# Patient Record
Sex: Female | Born: 1955 | Race: Black or African American | Hispanic: No | Marital: Single | Smoking: Current every day smoker
Health system: Southern US, Community
[De-identification: ages and names within clinical notes are randomized; demographics above are authoritative.]

## PROBLEM LIST (undated history)

## (undated) DIAGNOSIS — K644 Residual hemorrhoidal skin tags: Secondary | ICD-10-CM

## (undated) DIAGNOSIS — E119 Type 2 diabetes mellitus without complications: Secondary | ICD-10-CM

## (undated) DIAGNOSIS — K635 Polyp of colon: Secondary | ICD-10-CM

## (undated) DIAGNOSIS — K219 Gastro-esophageal reflux disease without esophagitis: Secondary | ICD-10-CM

## (undated) DIAGNOSIS — Z789 Other specified health status: Secondary | ICD-10-CM

## (undated) DIAGNOSIS — I10 Essential (primary) hypertension: Secondary | ICD-10-CM

## (undated) DIAGNOSIS — N959 Unspecified menopausal and perimenopausal disorder: Secondary | ICD-10-CM

## (undated) DIAGNOSIS — D259 Leiomyoma of uterus, unspecified: Secondary | ICD-10-CM

## (undated) DIAGNOSIS — M1990 Unspecified osteoarthritis, unspecified site: Secondary | ICD-10-CM

## (undated) DIAGNOSIS — G4700 Insomnia, unspecified: Secondary | ICD-10-CM

## (undated) DIAGNOSIS — N950 Postmenopausal bleeding: Secondary | ICD-10-CM

## (undated) DIAGNOSIS — E785 Hyperlipidemia, unspecified: Secondary | ICD-10-CM

## (undated) DIAGNOSIS — H35039 Hypertensive retinopathy, unspecified eye: Secondary | ICD-10-CM

## (undated) DIAGNOSIS — M6530 Trigger finger, unspecified finger: Secondary | ICD-10-CM

## (undated) DIAGNOSIS — E1142 Type 2 diabetes mellitus with diabetic polyneuropathy: Secondary | ICD-10-CM

## (undated) DIAGNOSIS — D126 Benign neoplasm of colon, unspecified: Secondary | ICD-10-CM

## (undated) DIAGNOSIS — N6089 Other benign mammary dysplasias of unspecified breast: Secondary | ICD-10-CM

## (undated) DIAGNOSIS — G510 Bell's palsy: Secondary | ICD-10-CM

## (undated) HISTORY — DX: Trigger finger, unspecified finger: M65.30

## (undated) HISTORY — DX: Bell's palsy: G51.0

## (undated) HISTORY — DX: Hyperlipidemia, unspecified: E78.5

## (undated) HISTORY — DX: Essential (primary) hypertension: I10

## (undated) HISTORY — DX: Gastro-esophageal reflux disease without esophagitis: K21.9

## (undated) HISTORY — DX: Insomnia, unspecified: G47.00

## (undated) HISTORY — DX: Postmenopausal bleeding: N95.0

## (undated) HISTORY — DX: Unspecified osteoarthritis, unspecified site: M19.90

## (undated) HISTORY — DX: Type 2 diabetes mellitus without complications: E11.9

## (undated) HISTORY — DX: Leiomyoma of uterus, unspecified: D25.9

## (undated) HISTORY — DX: Hypertensive retinopathy, unspecified eye: H35.039

## (undated) HISTORY — DX: Polyp of colon: K63.5

## (undated) HISTORY — DX: Other benign mammary dysplasias of unspecified breast: N60.89

## (undated) HISTORY — DX: Unspecified menopausal and perimenopausal disorder: N95.9

## (undated) HISTORY — DX: Type 2 diabetes mellitus with diabetic polyneuropathy: E11.42

## (undated) HISTORY — DX: Benign neoplasm of colon, unspecified: D12.6

## (undated) HISTORY — PX: TUBAL LIGATION: SHX77

## (undated) HISTORY — DX: Other specified health status: Z78.9

## (undated) HISTORY — DX: Residual hemorrhoidal skin tags: K64.4

---

## 1997-12-20 ENCOUNTER — Inpatient Hospital Stay (HOSPITAL_COMMUNITY): Admission: EM | Admit: 1997-12-20 | Discharge: 1997-12-20 | Payer: Self-pay | Admitting: Obstetrics & Gynecology

## 1998-01-10 ENCOUNTER — Encounter: Admission: RE | Admit: 1998-01-10 | Discharge: 1998-01-10 | Payer: Self-pay | Admitting: Hematology and Oncology

## 1998-02-24 ENCOUNTER — Encounter: Admission: RE | Admit: 1998-02-24 | Discharge: 1998-02-24 | Payer: Self-pay | Admitting: Obstetrics

## 1998-02-24 ENCOUNTER — Other Ambulatory Visit: Admission: RE | Admit: 1998-02-24 | Discharge: 1998-02-24 | Payer: Self-pay | Admitting: Obstetrics

## 1998-02-25 ENCOUNTER — Encounter: Admission: RE | Admit: 1998-02-25 | Discharge: 1998-02-25 | Payer: Self-pay | Admitting: Internal Medicine

## 1998-03-08 ENCOUNTER — Encounter: Admission: RE | Admit: 1998-03-08 | Discharge: 1998-03-08 | Payer: Self-pay | Admitting: Hematology and Oncology

## 1998-03-30 ENCOUNTER — Encounter: Admission: RE | Admit: 1998-03-30 | Discharge: 1998-03-30 | Payer: Self-pay | Admitting: Internal Medicine

## 1998-06-10 ENCOUNTER — Encounter: Admission: RE | Admit: 1998-06-10 | Discharge: 1998-06-10 | Payer: Self-pay | Admitting: Internal Medicine

## 1998-07-10 ENCOUNTER — Inpatient Hospital Stay (HOSPITAL_COMMUNITY): Admission: AD | Admit: 1998-07-10 | Discharge: 1998-07-10 | Payer: Self-pay | Admitting: Obstetrics

## 1998-07-11 ENCOUNTER — Ambulatory Visit (HOSPITAL_COMMUNITY): Admission: RE | Admit: 1998-07-11 | Discharge: 1998-07-11 | Payer: Self-pay | Admitting: Obstetrics

## 1998-07-18 ENCOUNTER — Encounter: Admission: RE | Admit: 1998-07-18 | Discharge: 1998-07-18 | Payer: Self-pay | Admitting: Internal Medicine

## 1998-07-21 ENCOUNTER — Encounter: Admission: RE | Admit: 1998-07-21 | Discharge: 1998-07-21 | Payer: Self-pay | Admitting: Obstetrics

## 1998-08-09 ENCOUNTER — Encounter (HOSPITAL_COMMUNITY): Admission: RE | Admit: 1998-08-09 | Discharge: 1998-11-07 | Payer: Self-pay | Admitting: Internal Medicine

## 1998-10-06 ENCOUNTER — Encounter: Admission: RE | Admit: 1998-10-06 | Discharge: 1998-10-06 | Payer: Self-pay | Admitting: Internal Medicine

## 1998-11-18 ENCOUNTER — Encounter: Admission: RE | Admit: 1998-11-18 | Discharge: 1998-11-18 | Payer: Self-pay | Admitting: Internal Medicine

## 1999-01-25 ENCOUNTER — Encounter: Admission: RE | Admit: 1999-01-25 | Discharge: 1999-01-25 | Payer: Self-pay | Admitting: Internal Medicine

## 1999-04-12 ENCOUNTER — Encounter: Payer: Self-pay | Admitting: Obstetrics

## 1999-04-12 ENCOUNTER — Inpatient Hospital Stay (HOSPITAL_COMMUNITY): Admission: AD | Admit: 1999-04-12 | Discharge: 1999-04-14 | Payer: Self-pay | Admitting: Obstetrics

## 1999-04-13 ENCOUNTER — Encounter: Payer: Self-pay | Admitting: Obstetrics

## 1999-05-31 ENCOUNTER — Encounter: Admission: RE | Admit: 1999-05-31 | Discharge: 1999-05-31 | Payer: Self-pay | Admitting: Internal Medicine

## 1999-08-24 ENCOUNTER — Encounter: Admission: RE | Admit: 1999-08-24 | Discharge: 1999-08-24 | Payer: Self-pay | Admitting: Hematology and Oncology

## 1999-08-24 ENCOUNTER — Ambulatory Visit (HOSPITAL_COMMUNITY): Admission: RE | Admit: 1999-08-24 | Discharge: 1999-08-24 | Payer: Self-pay | Admitting: Hematology and Oncology

## 1999-08-24 ENCOUNTER — Encounter: Payer: Self-pay | Admitting: Hematology and Oncology

## 1999-10-19 ENCOUNTER — Ambulatory Visit (HOSPITAL_COMMUNITY): Admission: RE | Admit: 1999-10-19 | Discharge: 1999-10-19 | Payer: Self-pay | Admitting: Hematology and Oncology

## 1999-10-19 ENCOUNTER — Encounter: Admission: RE | Admit: 1999-10-19 | Discharge: 1999-10-19 | Payer: Self-pay | Admitting: Hematology and Oncology

## 1999-11-21 ENCOUNTER — Encounter: Admission: RE | Admit: 1999-11-21 | Discharge: 1999-11-21 | Payer: Self-pay | Admitting: Internal Medicine

## 2000-01-31 ENCOUNTER — Encounter: Admission: RE | Admit: 2000-01-31 | Discharge: 2000-01-31 | Payer: Self-pay | Admitting: Hematology and Oncology

## 2000-02-25 ENCOUNTER — Emergency Department (HOSPITAL_COMMUNITY): Admission: EM | Admit: 2000-02-25 | Discharge: 2000-02-26 | Payer: Self-pay | Admitting: Emergency Medicine

## 2000-03-20 ENCOUNTER — Encounter: Admission: RE | Admit: 2000-03-20 | Discharge: 2000-03-20 | Payer: Self-pay | Admitting: Internal Medicine

## 2000-04-25 ENCOUNTER — Encounter: Admission: RE | Admit: 2000-04-25 | Discharge: 2000-04-25 | Payer: Self-pay | Admitting: Hematology and Oncology

## 2000-05-24 ENCOUNTER — Encounter: Admission: RE | Admit: 2000-05-24 | Discharge: 2000-05-24 | Payer: Self-pay | Admitting: Internal Medicine

## 2000-06-02 ENCOUNTER — Inpatient Hospital Stay (HOSPITAL_COMMUNITY): Admission: AD | Admit: 2000-06-02 | Discharge: 2000-06-02 | Payer: Self-pay | Admitting: Obstetrics & Gynecology

## 2000-07-23 ENCOUNTER — Encounter: Admission: RE | Admit: 2000-07-23 | Discharge: 2000-07-23 | Payer: Self-pay | Admitting: Hematology and Oncology

## 2000-08-03 ENCOUNTER — Inpatient Hospital Stay (HOSPITAL_COMMUNITY): Admission: AD | Admit: 2000-08-03 | Discharge: 2000-08-03 | Payer: Self-pay | Admitting: *Deleted

## 2000-09-27 ENCOUNTER — Ambulatory Visit (HOSPITAL_COMMUNITY): Admission: RE | Admit: 2000-09-27 | Discharge: 2000-09-27 | Payer: Self-pay | Admitting: Internal Medicine

## 2000-09-27 ENCOUNTER — Encounter: Admission: RE | Admit: 2000-09-27 | Discharge: 2000-09-27 | Payer: Self-pay | Admitting: Internal Medicine

## 2000-12-05 ENCOUNTER — Encounter: Admission: RE | Admit: 2000-12-05 | Discharge: 2000-12-05 | Payer: Self-pay | Admitting: Internal Medicine

## 2001-02-18 ENCOUNTER — Encounter: Admission: RE | Admit: 2001-02-18 | Discharge: 2001-02-18 | Payer: Self-pay | Admitting: Internal Medicine

## 2001-03-10 ENCOUNTER — Encounter: Admission: RE | Admit: 2001-03-10 | Discharge: 2001-03-10 | Payer: Self-pay | Admitting: Internal Medicine

## 2001-03-28 ENCOUNTER — Encounter: Admission: RE | Admit: 2001-03-28 | Discharge: 2001-03-28 | Payer: Self-pay | Admitting: Internal Medicine

## 2001-05-04 ENCOUNTER — Emergency Department (HOSPITAL_COMMUNITY): Admission: EM | Admit: 2001-05-04 | Discharge: 2001-05-04 | Payer: Self-pay | Admitting: Emergency Medicine

## 2001-05-14 ENCOUNTER — Encounter: Admission: RE | Admit: 2001-05-14 | Discharge: 2001-05-14 | Payer: Self-pay | Admitting: Internal Medicine

## 2001-05-14 ENCOUNTER — Ambulatory Visit (HOSPITAL_COMMUNITY): Admission: RE | Admit: 2001-05-14 | Discharge: 2001-05-14 | Payer: Self-pay | Admitting: Internal Medicine

## 2001-05-16 ENCOUNTER — Encounter: Payer: Self-pay | Admitting: Internal Medicine

## 2001-05-16 ENCOUNTER — Ambulatory Visit (HOSPITAL_COMMUNITY): Admission: RE | Admit: 2001-05-16 | Discharge: 2001-05-16 | Payer: Self-pay | Admitting: Internal Medicine

## 2001-05-26 ENCOUNTER — Encounter: Admission: RE | Admit: 2001-05-26 | Discharge: 2001-05-26 | Payer: Self-pay | Admitting: Internal Medicine

## 2001-08-19 ENCOUNTER — Encounter: Admission: RE | Admit: 2001-08-19 | Discharge: 2001-08-19 | Payer: Self-pay | Admitting: Internal Medicine

## 2001-10-09 ENCOUNTER — Emergency Department (HOSPITAL_COMMUNITY): Admission: EM | Admit: 2001-10-09 | Discharge: 2001-10-09 | Payer: Self-pay

## 2001-10-17 ENCOUNTER — Encounter: Admission: RE | Admit: 2001-10-17 | Discharge: 2001-10-17 | Payer: Self-pay | Admitting: *Deleted

## 2001-10-17 ENCOUNTER — Other Ambulatory Visit: Admission: RE | Admit: 2001-10-17 | Discharge: 2001-11-06 | Payer: Self-pay | Admitting: Obstetrics & Gynecology

## 2001-11-22 ENCOUNTER — Emergency Department (HOSPITAL_COMMUNITY): Admission: EM | Admit: 2001-11-22 | Discharge: 2001-11-22 | Payer: Self-pay | Admitting: Emergency Medicine

## 2001-11-26 ENCOUNTER — Encounter: Admission: RE | Admit: 2001-11-26 | Discharge: 2001-11-26 | Payer: Self-pay

## 2001-12-11 ENCOUNTER — Encounter: Admission: RE | Admit: 2001-12-11 | Discharge: 2002-03-11 | Payer: Self-pay | Admitting: *Deleted

## 2001-12-17 ENCOUNTER — Encounter: Admission: RE | Admit: 2001-12-17 | Discharge: 2001-12-17 | Payer: Self-pay | Admitting: Internal Medicine

## 2002-02-06 ENCOUNTER — Encounter: Admission: RE | Admit: 2002-02-06 | Discharge: 2002-02-06 | Payer: Self-pay | Admitting: Internal Medicine

## 2002-02-10 ENCOUNTER — Emergency Department (HOSPITAL_COMMUNITY): Admission: EM | Admit: 2002-02-10 | Discharge: 2002-02-10 | Payer: Self-pay | Admitting: Emergency Medicine

## 2002-03-10 ENCOUNTER — Encounter: Admission: RE | Admit: 2002-03-10 | Discharge: 2002-03-10 | Payer: Self-pay | Admitting: Internal Medicine

## 2002-03-24 ENCOUNTER — Encounter: Admission: RE | Admit: 2002-03-24 | Discharge: 2002-03-24 | Payer: Self-pay | Admitting: Internal Medicine

## 2002-04-21 ENCOUNTER — Encounter: Admission: RE | Admit: 2002-04-21 | Discharge: 2002-04-21 | Payer: Self-pay | Admitting: Internal Medicine

## 2002-04-24 ENCOUNTER — Encounter: Admission: RE | Admit: 2002-04-24 | Discharge: 2002-04-24 | Payer: Self-pay | Admitting: Internal Medicine

## 2002-04-28 ENCOUNTER — Ambulatory Visit (HOSPITAL_COMMUNITY): Admission: RE | Admit: 2002-04-28 | Discharge: 2002-04-28 | Payer: Self-pay | Admitting: Internal Medicine

## 2002-05-12 ENCOUNTER — Encounter: Admission: RE | Admit: 2002-05-12 | Discharge: 2002-05-12 | Payer: Self-pay | Admitting: Internal Medicine

## 2002-06-17 ENCOUNTER — Encounter: Admission: RE | Admit: 2002-06-17 | Discharge: 2002-06-17 | Payer: Self-pay | Admitting: Internal Medicine

## 2002-06-29 ENCOUNTER — Encounter: Admission: RE | Admit: 2002-06-29 | Discharge: 2002-06-29 | Payer: Self-pay | Admitting: Internal Medicine

## 2002-07-25 ENCOUNTER — Emergency Department (HOSPITAL_COMMUNITY): Admission: EM | Admit: 2002-07-25 | Discharge: 2002-07-26 | Payer: Self-pay | Admitting: Emergency Medicine

## 2002-08-22 ENCOUNTER — Inpatient Hospital Stay (HOSPITAL_COMMUNITY): Admission: AD | Admit: 2002-08-22 | Discharge: 2002-08-22 | Payer: Self-pay | Admitting: *Deleted

## 2002-09-24 ENCOUNTER — Other Ambulatory Visit: Admission: RE | Admit: 2002-09-24 | Discharge: 2002-09-24 | Payer: Self-pay | Admitting: Obstetrics and Gynecology

## 2002-09-24 ENCOUNTER — Encounter: Admission: RE | Admit: 2002-09-24 | Discharge: 2002-09-24 | Payer: Self-pay | Admitting: Obstetrics and Gynecology

## 2002-09-28 ENCOUNTER — Ambulatory Visit (HOSPITAL_COMMUNITY): Admission: RE | Admit: 2002-09-28 | Discharge: 2002-09-28 | Payer: Self-pay | Admitting: *Deleted

## 2002-10-19 ENCOUNTER — Encounter: Admission: RE | Admit: 2002-10-19 | Discharge: 2002-10-19 | Payer: Self-pay | Admitting: Internal Medicine

## 2002-12-31 ENCOUNTER — Encounter: Admission: RE | Admit: 2002-12-31 | Discharge: 2002-12-31 | Payer: Self-pay | Admitting: Internal Medicine

## 2003-01-22 ENCOUNTER — Inpatient Hospital Stay (HOSPITAL_COMMUNITY): Admission: AD | Admit: 2003-01-22 | Discharge: 2003-01-22 | Payer: Self-pay | Admitting: Family Medicine

## 2003-01-22 ENCOUNTER — Encounter: Payer: Self-pay | Admitting: *Deleted

## 2003-02-04 ENCOUNTER — Encounter: Admission: RE | Admit: 2003-02-04 | Discharge: 2003-02-04 | Payer: Self-pay | Admitting: Obstetrics and Gynecology

## 2003-02-10 ENCOUNTER — Encounter: Admission: RE | Admit: 2003-02-10 | Discharge: 2003-02-10 | Payer: Self-pay | Admitting: Internal Medicine

## 2003-02-10 ENCOUNTER — Ambulatory Visit (HOSPITAL_COMMUNITY): Admission: RE | Admit: 2003-02-10 | Discharge: 2003-02-10 | Payer: Self-pay | Admitting: Internal Medicine

## 2003-02-10 ENCOUNTER — Encounter: Payer: Self-pay | Admitting: Internal Medicine

## 2003-03-04 ENCOUNTER — Encounter: Admission: RE | Admit: 2003-03-04 | Discharge: 2003-03-04 | Payer: Self-pay | Admitting: Internal Medicine

## 2003-04-06 ENCOUNTER — Encounter: Admission: RE | Admit: 2003-04-06 | Discharge: 2003-04-06 | Payer: Self-pay | Admitting: Internal Medicine

## 2003-04-28 ENCOUNTER — Encounter: Admission: RE | Admit: 2003-04-28 | Discharge: 2003-04-28 | Payer: Self-pay | Admitting: Internal Medicine

## 2003-05-24 ENCOUNTER — Encounter: Admission: RE | Admit: 2003-05-24 | Discharge: 2003-05-24 | Payer: Self-pay | Admitting: Internal Medicine

## 2003-06-15 ENCOUNTER — Inpatient Hospital Stay (HOSPITAL_COMMUNITY): Admission: AD | Admit: 2003-06-15 | Discharge: 2003-06-15 | Payer: Self-pay | Admitting: *Deleted

## 2003-07-02 ENCOUNTER — Encounter: Admission: RE | Admit: 2003-07-02 | Discharge: 2003-07-02 | Payer: Self-pay | Admitting: Internal Medicine

## 2003-07-14 ENCOUNTER — Encounter: Admission: RE | Admit: 2003-07-14 | Discharge: 2003-07-14 | Payer: Self-pay | Admitting: Internal Medicine

## 2003-08-26 ENCOUNTER — Encounter: Admission: RE | Admit: 2003-08-26 | Discharge: 2003-08-26 | Payer: Self-pay | Admitting: Internal Medicine

## 2003-09-22 ENCOUNTER — Encounter: Admission: RE | Admit: 2003-09-22 | Discharge: 2003-09-22 | Payer: Self-pay | Admitting: Internal Medicine

## 2003-10-10 ENCOUNTER — Inpatient Hospital Stay (HOSPITAL_COMMUNITY): Admission: AD | Admit: 2003-10-10 | Discharge: 2003-10-10 | Payer: Self-pay | Admitting: Obstetrics and Gynecology

## 2003-10-22 ENCOUNTER — Encounter: Admission: RE | Admit: 2003-10-22 | Discharge: 2003-10-22 | Payer: Self-pay | Admitting: Internal Medicine

## 2003-10-27 ENCOUNTER — Encounter: Admission: RE | Admit: 2003-10-27 | Discharge: 2003-10-27 | Payer: Self-pay | Admitting: Internal Medicine

## 2003-11-08 ENCOUNTER — Encounter: Admission: RE | Admit: 2003-11-08 | Discharge: 2003-11-08 | Payer: Self-pay | Admitting: Internal Medicine

## 2003-11-29 ENCOUNTER — Encounter: Admission: RE | Admit: 2003-11-29 | Discharge: 2003-11-29 | Payer: Self-pay | Admitting: Internal Medicine

## 2003-12-28 ENCOUNTER — Encounter: Admission: RE | Admit: 2003-12-28 | Discharge: 2003-12-28 | Payer: Self-pay | Admitting: Internal Medicine

## 2004-01-07 ENCOUNTER — Encounter: Admission: RE | Admit: 2004-01-07 | Discharge: 2004-01-07 | Payer: Self-pay | Admitting: Obstetrics and Gynecology

## 2004-01-07 ENCOUNTER — Encounter (INDEPENDENT_AMBULATORY_CARE_PROVIDER_SITE_OTHER): Payer: Self-pay | Admitting: *Deleted

## 2004-01-07 LAB — CONVERTED CEMR LAB: Pap Smear: NORMAL

## 2004-01-15 ENCOUNTER — Inpatient Hospital Stay (HOSPITAL_COMMUNITY): Admission: AD | Admit: 2004-01-15 | Discharge: 2004-01-15 | Payer: Self-pay | Admitting: Obstetrics and Gynecology

## 2004-02-01 ENCOUNTER — Encounter: Admission: RE | Admit: 2004-02-01 | Discharge: 2004-02-01 | Payer: Self-pay | Admitting: Internal Medicine

## 2004-03-16 ENCOUNTER — Encounter: Admission: RE | Admit: 2004-03-16 | Discharge: 2004-03-16 | Payer: Self-pay | Admitting: Internal Medicine

## 2004-04-25 ENCOUNTER — Encounter: Admission: RE | Admit: 2004-04-25 | Discharge: 2004-04-25 | Payer: Self-pay | Admitting: Internal Medicine

## 2004-05-09 ENCOUNTER — Emergency Department (HOSPITAL_COMMUNITY): Admission: EM | Admit: 2004-05-09 | Discharge: 2004-05-10 | Payer: Self-pay | Admitting: Emergency Medicine

## 2004-06-01 ENCOUNTER — Inpatient Hospital Stay (HOSPITAL_COMMUNITY): Admission: AD | Admit: 2004-06-01 | Discharge: 2004-06-01 | Payer: Self-pay | Admitting: Gynecology

## 2004-06-08 ENCOUNTER — Ambulatory Visit: Payer: Self-pay | Admitting: Family Medicine

## 2004-06-08 ENCOUNTER — Other Ambulatory Visit: Admission: RE | Admit: 2004-06-08 | Discharge: 2004-06-08 | Payer: Self-pay | Admitting: Obstetrics and Gynecology

## 2004-06-08 ENCOUNTER — Encounter (INDEPENDENT_AMBULATORY_CARE_PROVIDER_SITE_OTHER): Payer: Self-pay | Admitting: Specialist

## 2004-06-08 DIAGNOSIS — N950 Postmenopausal bleeding: Secondary | ICD-10-CM

## 2004-06-08 HISTORY — DX: Postmenopausal bleeding: N95.0

## 2004-06-26 ENCOUNTER — Ambulatory Visit: Payer: Self-pay | Admitting: Internal Medicine

## 2004-07-13 ENCOUNTER — Ambulatory Visit: Payer: Self-pay | Admitting: Internal Medicine

## 2004-08-14 ENCOUNTER — Ambulatory Visit: Payer: Self-pay | Admitting: Internal Medicine

## 2004-08-29 ENCOUNTER — Ambulatory Visit: Payer: Self-pay | Admitting: Internal Medicine

## 2004-09-19 ENCOUNTER — Ambulatory Visit: Payer: Self-pay | Admitting: Internal Medicine

## 2004-10-09 ENCOUNTER — Emergency Department (HOSPITAL_COMMUNITY): Admission: EM | Admit: 2004-10-09 | Discharge: 2004-10-09 | Payer: Self-pay | Admitting: Emergency Medicine

## 2004-10-23 ENCOUNTER — Ambulatory Visit: Payer: Self-pay | Admitting: Internal Medicine

## 2004-11-08 ENCOUNTER — Inpatient Hospital Stay (HOSPITAL_COMMUNITY): Admission: AD | Admit: 2004-11-08 | Discharge: 2004-11-08 | Payer: Self-pay | Admitting: Family Medicine

## 2004-11-24 ENCOUNTER — Ambulatory Visit: Payer: Self-pay | Admitting: Internal Medicine

## 2004-12-11 ENCOUNTER — Emergency Department (HOSPITAL_COMMUNITY): Admission: EM | Admit: 2004-12-11 | Discharge: 2004-12-11 | Payer: Self-pay | Admitting: Family Medicine

## 2004-12-22 ENCOUNTER — Ambulatory Visit: Payer: Self-pay | Admitting: Internal Medicine

## 2004-12-27 ENCOUNTER — Ambulatory Visit (HOSPITAL_COMMUNITY): Admission: RE | Admit: 2004-12-27 | Discharge: 2004-12-27 | Payer: Self-pay | Admitting: Internal Medicine

## 2005-01-04 ENCOUNTER — Ambulatory Visit: Payer: Self-pay | Admitting: Internal Medicine

## 2005-01-12 ENCOUNTER — Ambulatory Visit: Payer: Self-pay | Admitting: Internal Medicine

## 2005-01-19 ENCOUNTER — Emergency Department (HOSPITAL_COMMUNITY): Admission: EM | Admit: 2005-01-19 | Discharge: 2005-01-19 | Payer: Self-pay | Admitting: Family Medicine

## 2005-02-07 ENCOUNTER — Ambulatory Visit: Payer: Self-pay | Admitting: Internal Medicine

## 2005-02-21 ENCOUNTER — Ambulatory Visit: Payer: Self-pay | Admitting: Internal Medicine

## 2005-04-17 ENCOUNTER — Ambulatory Visit: Payer: Self-pay | Admitting: Internal Medicine

## 2005-05-20 ENCOUNTER — Emergency Department (HOSPITAL_COMMUNITY): Admission: EM | Admit: 2005-05-20 | Discharge: 2005-05-20 | Payer: Self-pay | Admitting: Family Medicine

## 2005-05-23 ENCOUNTER — Ambulatory Visit: Payer: Self-pay | Admitting: Internal Medicine

## 2005-06-26 ENCOUNTER — Ambulatory Visit: Payer: Self-pay | Admitting: Internal Medicine

## 2005-07-12 ENCOUNTER — Ambulatory Visit: Payer: Self-pay | Admitting: Hospitalist

## 2005-07-25 ENCOUNTER — Emergency Department (HOSPITAL_COMMUNITY): Admission: EM | Admit: 2005-07-25 | Discharge: 2005-07-25 | Payer: Self-pay | Admitting: Family Medicine

## 2005-08-10 ENCOUNTER — Ambulatory Visit: Payer: Self-pay | Admitting: Internal Medicine

## 2005-08-17 ENCOUNTER — Ambulatory Visit (HOSPITAL_COMMUNITY): Admission: RE | Admit: 2005-08-17 | Discharge: 2005-08-17 | Payer: Self-pay | Admitting: Internal Medicine

## 2005-09-20 ENCOUNTER — Emergency Department (HOSPITAL_COMMUNITY): Admission: EM | Admit: 2005-09-20 | Discharge: 2005-09-20 | Payer: Self-pay | Admitting: Family Medicine

## 2005-10-17 ENCOUNTER — Ambulatory Visit: Payer: Self-pay | Admitting: Internal Medicine

## 2005-11-30 ENCOUNTER — Ambulatory Visit: Payer: Self-pay | Admitting: Internal Medicine

## 2005-12-03 ENCOUNTER — Emergency Department (HOSPITAL_COMMUNITY): Admission: EM | Admit: 2005-12-03 | Discharge: 2005-12-03 | Payer: Self-pay | Admitting: Family Medicine

## 2005-12-20 ENCOUNTER — Ambulatory Visit: Payer: Self-pay | Admitting: Internal Medicine

## 2005-12-25 ENCOUNTER — Ambulatory Visit: Payer: Self-pay | Admitting: Hospitalist

## 2005-12-25 ENCOUNTER — Encounter (INDEPENDENT_AMBULATORY_CARE_PROVIDER_SITE_OTHER): Payer: Self-pay | Admitting: *Deleted

## 2005-12-25 LAB — CONVERTED CEMR LAB
Cholesterol: 222 mg/dL
HDL: 56 mg/dL
LDL Cholesterol: 150 mg/dL
Triglycerides: 78 mg/dL

## 2005-12-25 LAB — FECAL OCCULT BLOOD, GUAIAC: Fecal Occult Blood: NEGATIVE

## 2006-01-02 ENCOUNTER — Ambulatory Visit (HOSPITAL_COMMUNITY): Admission: RE | Admit: 2006-01-02 | Discharge: 2006-01-02 | Payer: Self-pay | Admitting: Internal Medicine

## 2006-01-07 ENCOUNTER — Ambulatory Visit: Payer: Self-pay | Admitting: Hospitalist

## 2006-01-24 ENCOUNTER — Emergency Department (HOSPITAL_COMMUNITY): Admission: EM | Admit: 2006-01-24 | Discharge: 2006-01-24 | Payer: Self-pay | Admitting: Family Medicine

## 2006-02-22 ENCOUNTER — Ambulatory Visit (HOSPITAL_COMMUNITY): Admission: RE | Admit: 2006-02-22 | Discharge: 2006-02-22 | Payer: Self-pay | Admitting: Internal Medicine

## 2006-02-22 ENCOUNTER — Ambulatory Visit: Payer: Self-pay | Admitting: Internal Medicine

## 2006-03-07 ENCOUNTER — Ambulatory Visit (HOSPITAL_COMMUNITY): Admission: RE | Admit: 2006-03-07 | Discharge: 2006-03-07 | Payer: Self-pay | Admitting: Internal Medicine

## 2006-03-07 ENCOUNTER — Ambulatory Visit: Payer: Self-pay | Admitting: Internal Medicine

## 2006-03-14 ENCOUNTER — Ambulatory Visit: Payer: Self-pay | Admitting: Cardiology

## 2006-03-14 ENCOUNTER — Encounter (INDEPENDENT_AMBULATORY_CARE_PROVIDER_SITE_OTHER): Payer: Self-pay | Admitting: *Deleted

## 2006-04-22 ENCOUNTER — Ambulatory Visit: Payer: Self-pay | Admitting: Internal Medicine

## 2006-05-09 ENCOUNTER — Ambulatory Visit: Payer: Self-pay | Admitting: Internal Medicine

## 2006-05-09 ENCOUNTER — Ambulatory Visit (HOSPITAL_COMMUNITY): Admission: RE | Admit: 2006-05-09 | Discharge: 2006-05-09 | Payer: Self-pay | Admitting: Internal Medicine

## 2006-05-24 ENCOUNTER — Ambulatory Visit: Payer: Self-pay | Admitting: Internal Medicine

## 2006-06-21 ENCOUNTER — Ambulatory Visit: Payer: Self-pay | Admitting: Internal Medicine

## 2006-08-15 ENCOUNTER — Encounter (INDEPENDENT_AMBULATORY_CARE_PROVIDER_SITE_OTHER): Payer: Self-pay | Admitting: Internal Medicine

## 2006-08-15 ENCOUNTER — Ambulatory Visit: Payer: Self-pay | Admitting: Internal Medicine

## 2006-08-15 LAB — CONVERTED CEMR LAB: Streptococcus, Group A Screen (Direct): NEGATIVE

## 2006-11-04 ENCOUNTER — Encounter (INDEPENDENT_AMBULATORY_CARE_PROVIDER_SITE_OTHER): Payer: Self-pay | Admitting: *Deleted

## 2006-11-11 ENCOUNTER — Encounter (INDEPENDENT_AMBULATORY_CARE_PROVIDER_SITE_OTHER): Payer: Self-pay | Admitting: *Deleted

## 2006-11-11 DIAGNOSIS — E1142 Type 2 diabetes mellitus with diabetic polyneuropathy: Secondary | ICD-10-CM | POA: Insufficient documentation

## 2006-11-11 DIAGNOSIS — H35039 Hypertensive retinopathy, unspecified eye: Secondary | ICD-10-CM | POA: Insufficient documentation

## 2006-11-11 DIAGNOSIS — R252 Cramp and spasm: Secondary | ICD-10-CM | POA: Insufficient documentation

## 2006-11-11 DIAGNOSIS — K644 Residual hemorrhoidal skin tags: Secondary | ICD-10-CM | POA: Insufficient documentation

## 2006-11-11 DIAGNOSIS — N951 Menopausal and female climacteric states: Secondary | ICD-10-CM | POA: Insufficient documentation

## 2006-11-11 DIAGNOSIS — I10 Essential (primary) hypertension: Secondary | ICD-10-CM | POA: Insufficient documentation

## 2006-11-11 DIAGNOSIS — E785 Hyperlipidemia, unspecified: Secondary | ICD-10-CM | POA: Insufficient documentation

## 2006-11-12 ENCOUNTER — Ambulatory Visit: Payer: Self-pay | Admitting: Internal Medicine

## 2006-11-12 LAB — CONVERTED CEMR LAB
Glucose, Bld: 199 mg/dL
Hgb A1c MFr Bld: 6.3 %

## 2006-11-19 ENCOUNTER — Emergency Department (HOSPITAL_COMMUNITY): Admission: EM | Admit: 2006-11-19 | Discharge: 2006-11-19 | Payer: Self-pay | Admitting: Family Medicine

## 2006-12-17 ENCOUNTER — Other Ambulatory Visit: Admission: RE | Admit: 2006-12-17 | Discharge: 2006-12-17 | Payer: Self-pay | Admitting: *Deleted

## 2006-12-17 ENCOUNTER — Ambulatory Visit: Payer: Self-pay | Admitting: Hospitalist

## 2006-12-17 ENCOUNTER — Encounter (INDEPENDENT_AMBULATORY_CARE_PROVIDER_SITE_OTHER): Payer: Self-pay | Admitting: *Deleted

## 2007-01-06 ENCOUNTER — Telehealth (INDEPENDENT_AMBULATORY_CARE_PROVIDER_SITE_OTHER): Payer: Self-pay | Admitting: *Deleted

## 2007-02-12 ENCOUNTER — Encounter (INDEPENDENT_AMBULATORY_CARE_PROVIDER_SITE_OTHER): Payer: Self-pay | Admitting: Internal Medicine

## 2007-02-12 ENCOUNTER — Ambulatory Visit: Payer: Self-pay | Admitting: *Deleted

## 2007-02-12 LAB — CONVERTED CEMR LAB
Blood Glucose, Fingerstick: 266
Hgb A1c MFr Bld: 7.1 %
Sed Rate: 14 mm/hr (ref 0–22)
Total CK: 167 units/L (ref 7–177)

## 2007-03-18 ENCOUNTER — Encounter (INDEPENDENT_AMBULATORY_CARE_PROVIDER_SITE_OTHER): Payer: Self-pay | Admitting: *Deleted

## 2007-03-18 ENCOUNTER — Ambulatory Visit: Payer: Self-pay | Admitting: Internal Medicine

## 2007-03-18 LAB — CONVERTED CEMR LAB
BUN: 13 mg/dL (ref 6–23)
Blood Glucose, Fingerstick: 162
CO2: 26 meq/L (ref 19–32)
Calcium: 9.6 mg/dL (ref 8.4–10.5)
Chloride: 106 meq/L (ref 96–112)
Creatinine, Ser: 0.73 mg/dL (ref 0.40–1.20)
Glucose, Bld: 121 mg/dL — ABNORMAL HIGH (ref 70–99)
Potassium: 4.6 meq/L (ref 3.5–5.3)
Sodium: 142 meq/L (ref 135–145)

## 2007-03-20 ENCOUNTER — Telehealth: Payer: Self-pay | Admitting: *Deleted

## 2007-03-26 ENCOUNTER — Telehealth: Payer: Self-pay | Admitting: *Deleted

## 2007-04-22 ENCOUNTER — Ambulatory Visit: Payer: Self-pay | Admitting: Hospitalist

## 2007-04-22 LAB — CONVERTED CEMR LAB: Blood Glucose, Fingerstick: 199

## 2007-05-05 ENCOUNTER — Emergency Department (HOSPITAL_COMMUNITY): Admission: EM | Admit: 2007-05-05 | Discharge: 2007-05-05 | Payer: Self-pay | Admitting: Emergency Medicine

## 2007-05-23 ENCOUNTER — Ambulatory Visit (HOSPITAL_COMMUNITY): Admission: RE | Admit: 2007-05-23 | Discharge: 2007-05-23 | Payer: Self-pay | Admitting: Internal Medicine

## 2007-05-23 ENCOUNTER — Ambulatory Visit: Payer: Self-pay | Admitting: Internal Medicine

## 2007-05-23 ENCOUNTER — Encounter (INDEPENDENT_AMBULATORY_CARE_PROVIDER_SITE_OTHER): Payer: Self-pay | Admitting: Internal Medicine

## 2007-05-23 ENCOUNTER — Encounter (INDEPENDENT_AMBULATORY_CARE_PROVIDER_SITE_OTHER): Payer: Self-pay | Admitting: *Deleted

## 2007-05-23 LAB — CONVERTED CEMR LAB
Blood Glucose, Fingerstick: 127
Hgb A1c MFr Bld: 7 %
Streptococcus, Group A Screen (Direct): NEGATIVE

## 2007-05-28 ENCOUNTER — Encounter (INDEPENDENT_AMBULATORY_CARE_PROVIDER_SITE_OTHER): Payer: Self-pay | Admitting: *Deleted

## 2007-06-17 ENCOUNTER — Encounter (INDEPENDENT_AMBULATORY_CARE_PROVIDER_SITE_OTHER): Payer: Self-pay | Admitting: Internal Medicine

## 2007-06-17 ENCOUNTER — Ambulatory Visit: Payer: Self-pay | Admitting: Internal Medicine

## 2007-06-17 LAB — CONVERTED CEMR LAB
Blood Glucose, Fingerstick: 236
Candida species: NEGATIVE
Gardnerella vaginalis: NEGATIVE
Trichomonal Vaginitis: NEGATIVE

## 2007-06-30 ENCOUNTER — Emergency Department (HOSPITAL_COMMUNITY): Admission: EM | Admit: 2007-06-30 | Discharge: 2007-06-30 | Payer: Self-pay | Admitting: Emergency Medicine

## 2007-07-02 ENCOUNTER — Telehealth: Payer: Self-pay | Admitting: *Deleted

## 2007-07-02 ENCOUNTER — Ambulatory Visit: Payer: Self-pay | Admitting: Internal Medicine

## 2007-07-02 DIAGNOSIS — E1149 Type 2 diabetes mellitus with other diabetic neurological complication: Secondary | ICD-10-CM | POA: Insufficient documentation

## 2007-07-02 LAB — CONVERTED CEMR LAB: Blood Glucose, Fingerstick: 130

## 2007-07-20 ENCOUNTER — Emergency Department (HOSPITAL_COMMUNITY): Admission: EM | Admit: 2007-07-20 | Discharge: 2007-07-20 | Payer: Self-pay | Admitting: Family Medicine

## 2007-08-15 ENCOUNTER — Encounter (INDEPENDENT_AMBULATORY_CARE_PROVIDER_SITE_OTHER): Payer: Self-pay | Admitting: *Deleted

## 2007-08-15 ENCOUNTER — Ambulatory Visit: Payer: Self-pay | Admitting: *Deleted

## 2007-09-30 ENCOUNTER — Emergency Department (HOSPITAL_COMMUNITY): Admission: EM | Admit: 2007-09-30 | Discharge: 2007-09-30 | Payer: Self-pay | Admitting: Family Medicine

## 2007-10-08 ENCOUNTER — Telehealth (INDEPENDENT_AMBULATORY_CARE_PROVIDER_SITE_OTHER): Payer: Self-pay | Admitting: *Deleted

## 2007-10-15 ENCOUNTER — Telehealth (INDEPENDENT_AMBULATORY_CARE_PROVIDER_SITE_OTHER): Payer: Self-pay | Admitting: Pharmacy Technician

## 2007-10-29 ENCOUNTER — Ambulatory Visit: Payer: Self-pay | Admitting: Infectious Disease

## 2007-10-29 ENCOUNTER — Encounter (INDEPENDENT_AMBULATORY_CARE_PROVIDER_SITE_OTHER): Payer: Self-pay | Admitting: Internal Medicine

## 2007-10-29 DIAGNOSIS — L299 Pruritus, unspecified: Secondary | ICD-10-CM | POA: Insufficient documentation

## 2007-10-29 LAB — CONVERTED CEMR LAB
ALT: 20 units/L (ref 0–35)
AST: 14 units/L (ref 0–37)
Albumin: 4.7 g/dL (ref 3.5–5.2)
Alkaline Phosphatase: 71 units/L (ref 39–117)
BUN: 13 mg/dL (ref 6–23)
Basophils Absolute: 0 10*3/uL (ref 0.0–0.1)
Basophils Relative: 0 % (ref 0–1)
Bilirubin Urine: NEGATIVE
Blood Glucose, Fingerstick: 196
CO2: 27 meq/L (ref 19–32)
Calcium: 9.8 mg/dL (ref 8.4–10.5)
Chloride: 101 meq/L (ref 96–112)
Creatinine, Ser: 0.73 mg/dL (ref 0.40–1.20)
Eosinophils Absolute: 0.2 10*3/uL (ref 0.0–0.7)
Eosinophils Relative: 2 % (ref 0–5)
Glucose, Bld: 197 mg/dL — ABNORMAL HIGH (ref 70–99)
Glucose, Urine, Semiquant: NEGATIVE
HCT: 42.5 % (ref 36.0–46.0)
Hemoglobin: 14 g/dL (ref 12.0–15.0)
Ketones, urine, test strip: NEGATIVE
Lymphocytes Relative: 30 % (ref 12–46)
Lymphs Abs: 2.4 10*3/uL (ref 0.7–4.0)
MCHC: 32.9 g/dL (ref 30.0–36.0)
MCV: 88.4 fL (ref 78.0–100.0)
Monocytes Absolute: 0.5 10*3/uL (ref 0.1–1.0)
Monocytes Relative: 7 % (ref 3–12)
Neutro Abs: 5 10*3/uL (ref 1.7–7.7)
Neutrophils Relative %: 61 % (ref 43–77)
Nitrite: NEGATIVE
Platelets: 317 10*3/uL (ref 150–400)
Potassium: 4.4 meq/L (ref 3.5–5.3)
Protein, U semiquant: NEGATIVE
RBC: 4.81 M/uL (ref 3.87–5.11)
RDW: 13.1 % (ref 11.5–15.5)
Sodium: 139 meq/L (ref 135–145)
Specific Gravity, Urine: 1.02
Total Bilirubin: 0.4 mg/dL (ref 0.3–1.2)
Total Protein: 7.7 g/dL (ref 6.0–8.3)
Urobilinogen, UA: 0.2
WBC: 8.1 10*3/uL (ref 4.0–10.5)
pH: 5.5

## 2007-10-30 LAB — CONVERTED CEMR LAB
Bilirubin Urine: NEGATIVE
Hemoglobin, Urine: NEGATIVE
Ketones, ur: NEGATIVE mg/dL
Nitrite: NEGATIVE
Protein, ur: NEGATIVE mg/dL
RBC / HPF: NONE SEEN (ref <3)
Specific Gravity, Urine: 1.023 (ref 1.005–1.03)
Urine Glucose: NEGATIVE mg/dL
Urobilinogen, UA: 0.2 (ref 0.0–1.0)
pH: 6 (ref 5.0–8.0)

## 2007-11-03 ENCOUNTER — Encounter (INDEPENDENT_AMBULATORY_CARE_PROVIDER_SITE_OTHER): Payer: Self-pay | Admitting: Internal Medicine

## 2007-11-04 ENCOUNTER — Ambulatory Visit (HOSPITAL_COMMUNITY): Admission: RE | Admit: 2007-11-04 | Discharge: 2007-11-04 | Payer: Self-pay | Admitting: Internal Medicine

## 2007-12-02 ENCOUNTER — Ambulatory Visit: Payer: Self-pay | Admitting: Internal Medicine

## 2007-12-02 LAB — CONVERTED CEMR LAB
Blood Glucose, Fingerstick: 289
Hgb A1c MFr Bld: 8.1 %

## 2007-12-04 ENCOUNTER — Encounter (INDEPENDENT_AMBULATORY_CARE_PROVIDER_SITE_OTHER): Payer: Self-pay | Admitting: *Deleted

## 2007-12-26 ENCOUNTER — Encounter (INDEPENDENT_AMBULATORY_CARE_PROVIDER_SITE_OTHER): Payer: Self-pay | Admitting: *Deleted

## 2008-01-01 ENCOUNTER — Ambulatory Visit: Payer: Self-pay | Admitting: *Deleted

## 2008-01-01 LAB — CONVERTED CEMR LAB: Blood Glucose, Fingerstick: 252

## 2008-01-08 ENCOUNTER — Telehealth (INDEPENDENT_AMBULATORY_CARE_PROVIDER_SITE_OTHER): Payer: Self-pay | Admitting: *Deleted

## 2008-02-24 ENCOUNTER — Ambulatory Visit: Payer: Self-pay | Admitting: Internal Medicine

## 2008-02-24 ENCOUNTER — Other Ambulatory Visit: Admission: RE | Admit: 2008-02-24 | Discharge: 2008-02-24 | Payer: Self-pay | Admitting: Internal Medicine

## 2008-02-24 ENCOUNTER — Encounter (INDEPENDENT_AMBULATORY_CARE_PROVIDER_SITE_OTHER): Payer: Self-pay | Admitting: Internal Medicine

## 2008-02-24 LAB — CONVERTED CEMR LAB
Blood Glucose, Fingerstick: 259
Chlamydia, DNA Probe: NEGATIVE
GC Probe Amp, Genital: NEGATIVE
Hgb A1c MFr Bld: 7.4 %

## 2008-02-25 LAB — CONVERTED CEMR LAB
Candida species: POSITIVE — AB
Gardnerella vaginalis: NEGATIVE
Trichomonal Vaginitis: NEGATIVE

## 2008-03-03 ENCOUNTER — Ambulatory Visit: Payer: Self-pay | Admitting: Internal Medicine

## 2008-03-03 LAB — CONVERTED CEMR LAB: Blood Glucose, Fingerstick: 158

## 2008-03-04 ENCOUNTER — Ambulatory Visit (HOSPITAL_COMMUNITY): Admission: RE | Admit: 2008-03-04 | Discharge: 2008-03-04 | Payer: Self-pay | Admitting: *Deleted

## 2008-03-05 LAB — HM MAMMOGRAPHY

## 2008-03-08 DIAGNOSIS — D126 Benign neoplasm of colon, unspecified: Secondary | ICD-10-CM

## 2008-03-08 HISTORY — DX: Benign neoplasm of colon, unspecified: D12.6

## 2008-03-22 ENCOUNTER — Encounter: Payer: Self-pay | Admitting: Internal Medicine

## 2008-03-23 ENCOUNTER — Ambulatory Visit: Payer: Self-pay | Admitting: Internal Medicine

## 2008-03-23 LAB — CONVERTED CEMR LAB: Blood Glucose, Fingerstick: 99

## 2008-04-07 ENCOUNTER — Ambulatory Visit: Payer: Self-pay | Admitting: *Deleted

## 2008-04-07 ENCOUNTER — Ambulatory Visit (HOSPITAL_COMMUNITY): Admission: RE | Admit: 2008-04-07 | Discharge: 2008-04-07 | Payer: Self-pay | Admitting: *Deleted

## 2008-04-08 ENCOUNTER — Encounter: Payer: Self-pay | Admitting: Internal Medicine

## 2008-04-15 ENCOUNTER — Encounter: Payer: Self-pay | Admitting: Internal Medicine

## 2008-04-23 ENCOUNTER — Ambulatory Visit: Payer: Self-pay | Admitting: *Deleted

## 2008-04-23 LAB — CONVERTED CEMR LAB: Blood Glucose, Fingerstick: 155

## 2008-04-29 ENCOUNTER — Telehealth: Payer: Self-pay | Admitting: Internal Medicine

## 2008-05-27 ENCOUNTER — Telehealth: Payer: Self-pay | Admitting: *Deleted

## 2008-05-28 ENCOUNTER — Telehealth (INDEPENDENT_AMBULATORY_CARE_PROVIDER_SITE_OTHER): Payer: Self-pay | Admitting: Internal Medicine

## 2008-06-17 ENCOUNTER — Telehealth: Payer: Self-pay | Admitting: Internal Medicine

## 2008-06-28 ENCOUNTER — Ambulatory Visit: Payer: Self-pay | Admitting: Internal Medicine

## 2008-06-28 ENCOUNTER — Encounter (INDEPENDENT_AMBULATORY_CARE_PROVIDER_SITE_OTHER): Payer: Self-pay | Admitting: Internal Medicine

## 2008-06-28 LAB — CONVERTED CEMR LAB: Hgb A1c MFr Bld: 7.1 %

## 2008-06-29 LAB — CONVERTED CEMR LAB
Cholesterol: 219 mg/dL — ABNORMAL HIGH (ref 0–200)
Creatinine, Urine: 63.5 mg/dL
HDL: 46 mg/dL (ref >39)
LDL Cholesterol: 149 mg/dL — ABNORMAL HIGH (ref 0–99)
Microalb Creat Ratio: 6.8 mg/g (ref 0.0–30.0)
Microalb, Ur: 0.43 mg/dL (ref 0.00–1.89)
Total CHOL/HDL Ratio: 4.8
Triglycerides: 120 mg/dL (ref <150)
VLDL: 24 mg/dL (ref 0–40)

## 2008-06-30 ENCOUNTER — Telehealth: Payer: Self-pay | Admitting: *Deleted

## 2008-07-07 ENCOUNTER — Telehealth (INDEPENDENT_AMBULATORY_CARE_PROVIDER_SITE_OTHER): Payer: Self-pay | Admitting: Internal Medicine

## 2008-07-16 ENCOUNTER — Encounter: Payer: Self-pay | Admitting: Internal Medicine

## 2008-07-19 ENCOUNTER — Emergency Department (HOSPITAL_COMMUNITY): Admission: EM | Admit: 2008-07-19 | Discharge: 2008-07-19 | Payer: Self-pay | Admitting: Emergency Medicine

## 2008-07-23 ENCOUNTER — Telehealth: Payer: Self-pay | Admitting: *Deleted

## 2008-07-26 ENCOUNTER — Encounter: Payer: Self-pay | Admitting: Internal Medicine

## 2008-08-02 ENCOUNTER — Ambulatory Visit: Payer: Self-pay | Admitting: Internal Medicine

## 2008-08-13 ENCOUNTER — Ambulatory Visit: Payer: Self-pay | Admitting: Infectious Diseases

## 2008-08-13 LAB — CONVERTED CEMR LAB: Blood Glucose, Fingerstick: 151

## 2008-08-31 ENCOUNTER — Encounter (INDEPENDENT_AMBULATORY_CARE_PROVIDER_SITE_OTHER): Payer: Self-pay | Admitting: *Deleted

## 2008-08-31 ENCOUNTER — Ambulatory Visit (HOSPITAL_COMMUNITY): Admission: RE | Admit: 2008-08-31 | Discharge: 2008-08-31 | Payer: Self-pay | Admitting: Internal Medicine

## 2008-08-31 ENCOUNTER — Ambulatory Visit: Payer: Self-pay | Admitting: Internal Medicine

## 2008-08-31 LAB — CONVERTED CEMR LAB
ALT: 27 units/L (ref 0–35)
AST: 18 units/L (ref 0–37)
Albumin: 3.9 g/dL (ref 3.5–5.2)
Alkaline Phosphatase: 56 units/L (ref 39–117)
BUN: 12 mg/dL (ref 6–23)
Basophils Absolute: 0 10*3/uL (ref 0.0–0.1)
Basophils Relative: 0 % (ref 0–1)
Blood Glucose, Fingerstick: 155
CO2: 28 meq/L (ref 19–32)
Calcium: 9.8 mg/dL (ref 8.4–10.5)
Chloride: 102 meq/L (ref 96–112)
Creatinine, Ser: 0.75 mg/dL (ref 0.40–1.20)
Eosinophils Absolute: 0.1 10*3/uL (ref 0.0–0.7)
Eosinophils Relative: 2 % (ref 0–5)
Glucose, Bld: 265 mg/dL — ABNORMAL HIGH (ref 70–99)
HCT: 40.4 % (ref 36.0–46.0)
Hemoglobin: 13.6 g/dL (ref 12.0–15.0)
Lymphocytes Relative: 28 % (ref 12–46)
Lymphs Abs: 2 10*3/uL (ref 0.7–4.0)
MCHC: 33.6 g/dL (ref 30.0–36.0)
MCV: 89.7 fL (ref 78.0–100.0)
Monocytes Absolute: 0.4 10*3/uL (ref 0.1–1.0)
Monocytes Relative: 6 % (ref 3–12)
Neutro Abs: 4.5 10*3/uL (ref 1.7–7.7)
Neutrophils Relative %: 64 % (ref 43–77)
Platelets: 298 10*3/uL (ref 150–400)
Potassium: 3.7 meq/L (ref 3.5–5.3)
RBC: 4.51 M/uL (ref 3.87–5.11)
RDW: 12.7 % (ref 11.5–15.5)
Sodium: 138 meq/L (ref 135–145)
Total Bilirubin: 0.5 mg/dL (ref 0.3–1.2)
Total Protein: 6.8 g/dL (ref 6.0–8.3)
WBC: 7 10*3/uL (ref 4.0–10.5)

## 2008-09-21 ENCOUNTER — Ambulatory Visit: Payer: Self-pay | Admitting: Internal Medicine

## 2008-09-21 LAB — CONVERTED CEMR LAB
Blood Glucose, Fingerstick: 220
Hgb A1c MFr Bld: 6.9 %

## 2008-10-13 ENCOUNTER — Ambulatory Visit: Payer: Self-pay | Admitting: Internal Medicine

## 2008-10-13 ENCOUNTER — Encounter: Payer: Self-pay | Admitting: Internal Medicine

## 2008-10-13 LAB — CONVERTED CEMR LAB
Blood Glucose, Fingerstick: 186
Candida species: NEGATIVE
Gardnerella vaginalis: NEGATIVE
Trichomonal Vaginitis: NEGATIVE

## 2008-10-15 ENCOUNTER — Telehealth: Payer: Self-pay | Admitting: Internal Medicine

## 2008-10-27 ENCOUNTER — Ambulatory Visit: Payer: Self-pay | Admitting: Internal Medicine

## 2008-10-27 ENCOUNTER — Encounter (INDEPENDENT_AMBULATORY_CARE_PROVIDER_SITE_OTHER): Payer: Self-pay | Admitting: Internal Medicine

## 2008-10-27 ENCOUNTER — Encounter: Payer: Self-pay | Admitting: Internal Medicine

## 2008-10-27 LAB — CONVERTED CEMR LAB
Blood Glucose, Fingerstick: 167
Chlamydia, DNA Probe: NEGATIVE
GC Probe Amp, Genital: NEGATIVE
Vitamin B-12: 891 pg/mL (ref 211–911)

## 2008-10-28 ENCOUNTER — Encounter (INDEPENDENT_AMBULATORY_CARE_PROVIDER_SITE_OTHER): Payer: Self-pay | Admitting: Internal Medicine

## 2008-10-28 LAB — CONVERTED CEMR LAB
Candida species: NEGATIVE
Gardnerella vaginalis: NEGATIVE
Trichomonal Vaginitis: NEGATIVE

## 2008-11-18 ENCOUNTER — Encounter (INDEPENDENT_AMBULATORY_CARE_PROVIDER_SITE_OTHER): Payer: Self-pay | Admitting: *Deleted

## 2008-12-13 ENCOUNTER — Encounter: Payer: Self-pay | Admitting: Internal Medicine

## 2009-01-05 ENCOUNTER — Ambulatory Visit: Payer: Self-pay | Admitting: Internal Medicine

## 2009-01-05 ENCOUNTER — Encounter (INDEPENDENT_AMBULATORY_CARE_PROVIDER_SITE_OTHER): Payer: Self-pay | Admitting: Internal Medicine

## 2009-01-06 DIAGNOSIS — G510 Bell's palsy: Secondary | ICD-10-CM

## 2009-01-06 HISTORY — DX: Bell's palsy: G51.0

## 2009-01-06 LAB — CONVERTED CEMR LAB
ALT: 16 units/L (ref 0–35)
AST: 14 units/L (ref 0–37)
Albumin: 4.5 g/dL (ref 3.5–5.2)
Alkaline Phosphatase: 62 units/L (ref 39–117)
BUN: 13 mg/dL (ref 6–23)
CO2: 26 meq/L (ref 19–32)
Calcium: 10.1 mg/dL (ref 8.4–10.5)
Chloride: 102 meq/L (ref 96–112)
Cholesterol: 155 mg/dL (ref 0–200)
Creatinine, Ser: 0.74 mg/dL (ref 0.40–1.20)
GFR calc Af Amer: >60 mL/min (ref >60)
GFR calc non Af Amer: >60 mL/min (ref >60)
Glucose, Bld: 138 mg/dL — ABNORMAL HIGH (ref 70–99)
HDL: 44 mg/dL (ref >39)
LDL Cholesterol: 94 mg/dL (ref 0–99)
Potassium: 4.1 meq/L (ref 3.5–5.3)
Sodium: 141 meq/L (ref 135–145)
Total Bilirubin: 0.3 mg/dL (ref 0.3–1.2)
Total CHOL/HDL Ratio: 3.5
Total Protein: 7.5 g/dL (ref 6.0–8.3)
Triglycerides: 83 mg/dL (ref <150)
VLDL: 17 mg/dL (ref 0–40)

## 2009-01-11 ENCOUNTER — Telehealth (INDEPENDENT_AMBULATORY_CARE_PROVIDER_SITE_OTHER): Payer: Self-pay | Admitting: Internal Medicine

## 2009-01-18 ENCOUNTER — Ambulatory Visit: Payer: Self-pay | Admitting: Internal Medicine

## 2009-01-18 DIAGNOSIS — G510 Bell's palsy: Secondary | ICD-10-CM | POA: Insufficient documentation

## 2009-01-30 ENCOUNTER — Emergency Department (HOSPITAL_COMMUNITY): Admission: EM | Admit: 2009-01-30 | Discharge: 2009-01-30 | Payer: Self-pay | Admitting: Emergency Medicine

## 2009-02-03 ENCOUNTER — Encounter: Payer: Self-pay | Admitting: Internal Medicine

## 2009-02-03 ENCOUNTER — Ambulatory Visit: Payer: Self-pay | Admitting: Infectious Disease

## 2009-02-08 ENCOUNTER — Encounter (INDEPENDENT_AMBULATORY_CARE_PROVIDER_SITE_OTHER): Payer: Self-pay | Admitting: *Deleted

## 2009-02-08 ENCOUNTER — Telehealth: Payer: Self-pay | Admitting: Infectious Disease

## 2009-02-08 LAB — HM DIABETES EYE EXAM

## 2009-02-14 ENCOUNTER — Ambulatory Visit: Payer: Self-pay | Admitting: *Deleted

## 2009-02-28 ENCOUNTER — Ambulatory Visit: Payer: Self-pay | Admitting: *Deleted

## 2009-02-28 ENCOUNTER — Encounter (INDEPENDENT_AMBULATORY_CARE_PROVIDER_SITE_OTHER): Payer: Self-pay | Admitting: *Deleted

## 2009-02-28 LAB — HM PAP SMEAR: HM Pap smear: NEGATIVE

## 2009-02-28 LAB — CONVERTED CEMR LAB
Blood Glucose, Fingerstick: 153
Chlamydia, DNA Probe: NEGATIVE
GC Probe Amp, Genital: NEGATIVE

## 2009-03-01 ENCOUNTER — Telehealth: Payer: Self-pay | Admitting: *Deleted

## 2009-03-01 LAB — CONVERTED CEMR LAB
Candida species: NEGATIVE
Gardnerella vaginalis: POSITIVE — AB
Trichomonal Vaginitis: NEGATIVE

## 2009-03-21 ENCOUNTER — Ambulatory Visit: Payer: Self-pay | Admitting: Internal Medicine

## 2009-03-21 ENCOUNTER — Encounter: Payer: Self-pay | Admitting: Internal Medicine

## 2009-03-21 LAB — CONVERTED CEMR LAB
Blood Glucose, Fingerstick: 139
Chlamydia, DNA Probe: NEGATIVE
GC Probe Amp, Genital: NEGATIVE
Hgb A1c MFr Bld: 6.6 %

## 2009-03-22 ENCOUNTER — Encounter: Payer: Self-pay | Admitting: Internal Medicine

## 2009-03-22 LAB — CONVERTED CEMR LAB
Candida species: NEGATIVE
Gardnerella vaginalis: NEGATIVE
Trichomonal Vaginitis: NEGATIVE

## 2009-05-11 ENCOUNTER — Ambulatory Visit: Payer: Self-pay | Admitting: Internal Medicine

## 2009-05-11 DIAGNOSIS — G4700 Insomnia, unspecified: Secondary | ICD-10-CM | POA: Insufficient documentation

## 2009-05-11 LAB — CONVERTED CEMR LAB: Blood Glucose, Fingerstick: 147

## 2009-05-18 ENCOUNTER — Ambulatory Visit: Payer: Self-pay | Admitting: Internal Medicine

## 2009-05-19 ENCOUNTER — Telehealth: Payer: Self-pay | Admitting: Internal Medicine

## 2009-06-20 ENCOUNTER — Ambulatory Visit: Payer: Self-pay | Admitting: Infectious Diseases

## 2009-06-20 LAB — CONVERTED CEMR LAB
Blood Glucose, Fingerstick: 118
Hgb A1c MFr Bld: 6.5 %

## 2009-06-22 ENCOUNTER — Telehealth: Payer: Self-pay | Admitting: Internal Medicine

## 2009-06-22 LAB — CONVERTED CEMR LAB
Cholesterol: 219 mg/dL — ABNORMAL HIGH (ref 0–200)
HDL: 55 mg/dL (ref >39)
LDL Cholesterol: 140 mg/dL — ABNORMAL HIGH (ref 0–99)
Total CHOL/HDL Ratio: 4
Triglycerides: 118 mg/dL (ref <150)
VLDL: 24 mg/dL (ref 0–40)

## 2009-06-29 ENCOUNTER — Ambulatory Visit (HOSPITAL_COMMUNITY): Admission: RE | Admit: 2009-06-29 | Discharge: 2009-06-29 | Payer: Self-pay | Admitting: Infectious Diseases

## 2009-07-10 ENCOUNTER — Emergency Department (HOSPITAL_COMMUNITY): Admission: EM | Admit: 2009-07-10 | Discharge: 2009-07-11 | Payer: Self-pay | Admitting: Emergency Medicine

## 2009-07-19 ENCOUNTER — Ambulatory Visit: Payer: Self-pay | Admitting: Internal Medicine

## 2009-07-19 LAB — CONVERTED CEMR LAB
Bacteria, UA: NONE SEEN
Bilirubin Urine: NEGATIVE
Hemoglobin, Urine: NEGATIVE
Ketones, ur: NEGATIVE mg/dL
Nitrite: NEGATIVE
Protein, ur: NEGATIVE mg/dL
RBC / HPF: NONE SEEN (ref <3)
Specific Gravity, Urine: 1.016 (ref 1.005–1.0)
Urine Glucose: NEGATIVE mg/dL
Urobilinogen, UA: 0.2 (ref 0.0–1.0)
WBC, UA: NONE SEEN cells/hpf (ref <3)
pH: 6.5 (ref 5.0–8.0)

## 2009-07-22 ENCOUNTER — Telehealth: Payer: Self-pay | Admitting: *Deleted

## 2009-08-16 ENCOUNTER — Telehealth: Payer: Self-pay | Admitting: Internal Medicine

## 2009-08-24 ENCOUNTER — Telehealth: Payer: Self-pay | Admitting: *Deleted

## 2009-08-30 ENCOUNTER — Telehealth: Payer: Self-pay | Admitting: Internal Medicine

## 2009-09-27 ENCOUNTER — Ambulatory Visit: Payer: Self-pay | Admitting: Internal Medicine

## 2009-09-27 DIAGNOSIS — N6089 Other benign mammary dysplasias of unspecified breast: Secondary | ICD-10-CM | POA: Insufficient documentation

## 2009-10-10 ENCOUNTER — Encounter: Payer: Self-pay | Admitting: Internal Medicine

## 2009-11-03 ENCOUNTER — Telehealth: Payer: Self-pay | Admitting: *Deleted

## 2009-11-16 ENCOUNTER — Ambulatory Visit: Payer: Self-pay | Admitting: Internal Medicine

## 2009-11-16 LAB — CONVERTED CEMR LAB
Blood Glucose, Fingerstick: 179
Hgb A1c MFr Bld: 6.2 %

## 2009-11-17 ENCOUNTER — Encounter: Payer: Self-pay | Admitting: Internal Medicine

## 2009-11-17 LAB — CONVERTED CEMR LAB
ALT: 19 units/L (ref 0–35)
AST: 17 units/L (ref 0–37)
Albumin: 4.6 g/dL (ref 3.5–5.2)
Alkaline Phosphatase: 70 units/L (ref 39–117)
BUN: 13 mg/dL (ref 6–23)
CO2: 30 meq/L (ref 19–32)
Calcium: 9.9 mg/dL (ref 8.4–10.5)
Chloride: 100 meq/L (ref 96–112)
Cholesterol: 236 mg/dL — ABNORMAL HIGH (ref 0–200)
Creatinine, Ser: 0.77 mg/dL (ref 0.40–1.20)
Glucose, Bld: 87 mg/dL (ref 70–99)
HDL: 50 mg/dL (ref >39)
LDL Cholesterol: 129 mg/dL — ABNORMAL HIGH (ref 0–99)
Potassium: 4.2 meq/L (ref 3.5–5.3)
Sodium: 141 meq/L (ref 135–145)
Total Bilirubin: 0.3 mg/dL (ref 0.3–1.2)
Total CHOL/HDL Ratio: 4.7
Total Protein: 7.5 g/dL (ref 6.0–8.3)
Triglycerides: 284 mg/dL — ABNORMAL HIGH (ref <150)
VLDL: 57 mg/dL — ABNORMAL HIGH (ref 0–40)

## 2009-11-22 ENCOUNTER — Encounter: Payer: Self-pay | Admitting: Internal Medicine

## 2009-12-22 ENCOUNTER — Ambulatory Visit: Payer: Self-pay | Admitting: Internal Medicine

## 2009-12-22 DIAGNOSIS — M6530 Trigger finger, unspecified finger: Secondary | ICD-10-CM | POA: Insufficient documentation

## 2009-12-29 ENCOUNTER — Telehealth (INDEPENDENT_AMBULATORY_CARE_PROVIDER_SITE_OTHER): Payer: Self-pay | Admitting: Internal Medicine

## 2010-02-06 ENCOUNTER — Telehealth: Payer: Self-pay | Admitting: Internal Medicine

## 2010-02-09 ENCOUNTER — Ambulatory Visit (HOSPITAL_COMMUNITY): Admission: RE | Admit: 2010-02-09 | Discharge: 2010-02-09 | Payer: Self-pay | Admitting: Internal Medicine

## 2010-02-09 ENCOUNTER — Ambulatory Visit: Payer: Self-pay | Admitting: Internal Medicine

## 2010-02-09 LAB — CONVERTED CEMR LAB
Blood Glucose, Fingerstick: 109
Hgb A1c MFr Bld: 6.9 %

## 2010-02-23 ENCOUNTER — Ambulatory Visit: Payer: Self-pay | Admitting: Internal Medicine

## 2010-02-23 ENCOUNTER — Telehealth: Payer: Self-pay | Admitting: Internal Medicine

## 2010-03-10 ENCOUNTER — Ambulatory Visit: Payer: Self-pay | Admitting: Family Medicine

## 2010-03-27 ENCOUNTER — Ambulatory Visit: Payer: Self-pay | Admitting: Internal Medicine

## 2010-05-01 ENCOUNTER — Ambulatory Visit: Payer: Self-pay | Admitting: Internal Medicine

## 2010-05-01 ENCOUNTER — Encounter: Payer: Self-pay | Admitting: Internal Medicine

## 2010-05-01 DIAGNOSIS — R51 Headache: Secondary | ICD-10-CM | POA: Insufficient documentation

## 2010-05-01 DIAGNOSIS — R519 Headache, unspecified: Secondary | ICD-10-CM | POA: Insufficient documentation

## 2010-05-01 LAB — CONVERTED CEMR LAB
Blood Glucose, Fingerstick: 82
Hgb A1c MFr Bld: 6.6 %

## 2010-05-01 LAB — HM DIABETES FOOT EXAM

## 2010-05-17 ENCOUNTER — Ambulatory Visit: Payer: Self-pay | Admitting: Internal Medicine

## 2010-05-17 LAB — CONVERTED CEMR LAB: Blood Glucose, Fingerstick: 119

## 2010-05-23 ENCOUNTER — Telehealth: Payer: Self-pay | Admitting: Internal Medicine

## 2010-05-23 ENCOUNTER — Telehealth: Payer: Self-pay | Admitting: *Deleted

## 2010-06-01 ENCOUNTER — Ambulatory Visit: Payer: Self-pay | Admitting: Internal Medicine

## 2010-06-01 DIAGNOSIS — H9209 Otalgia, unspecified ear: Secondary | ICD-10-CM | POA: Insufficient documentation

## 2010-06-01 LAB — CONVERTED CEMR LAB: Blood Glucose, Fingerstick: 183

## 2010-06-13 ENCOUNTER — Telehealth: Payer: Self-pay | Admitting: Internal Medicine

## 2010-06-23 ENCOUNTER — Telehealth: Payer: Self-pay | Admitting: Internal Medicine

## 2010-07-28 ENCOUNTER — Ambulatory Visit (HOSPITAL_COMMUNITY)
Admission: RE | Admit: 2010-07-28 | Discharge: 2010-07-28 | Payer: Self-pay | Source: Home / Self Care | Admitting: Internal Medicine

## 2010-07-28 ENCOUNTER — Ambulatory Visit: Payer: Self-pay | Admitting: Internal Medicine

## 2010-07-28 LAB — CONVERTED CEMR LAB
Blood Glucose, Fingerstick: 183
Hgb A1c MFr Bld: 6.2 %

## 2010-08-11 ENCOUNTER — Ambulatory Visit: Payer: Self-pay | Admitting: Internal Medicine

## 2010-08-11 LAB — CONVERTED CEMR LAB: Blood Glucose, Fingerstick: 149

## 2010-08-28 ENCOUNTER — Ambulatory Visit: Payer: Self-pay | Admitting: Internal Medicine

## 2010-08-28 LAB — CONVERTED CEMR LAB: Blood Glucose, Fingerstick: 122

## 2010-09-13 ENCOUNTER — Telehealth: Payer: Self-pay | Admitting: Internal Medicine

## 2010-09-20 ENCOUNTER — Emergency Department (HOSPITAL_COMMUNITY)
Admission: EM | Admit: 2010-09-20 | Discharge: 2010-09-20 | Payer: Self-pay | Source: Home / Self Care | Admitting: Family Medicine

## 2010-09-20 ENCOUNTER — Ambulatory Visit: Payer: Self-pay | Admitting: Internal Medicine

## 2010-09-20 LAB — CONVERTED CEMR LAB: Blood Glucose, Fingerstick: 155

## 2010-10-04 ENCOUNTER — Encounter: Payer: Self-pay | Admitting: Internal Medicine

## 2010-10-10 ENCOUNTER — Encounter: Payer: Self-pay | Admitting: Internal Medicine

## 2010-10-18 ENCOUNTER — Ambulatory Visit: Admit: 2010-10-18 | Payer: Self-pay

## 2010-10-20 ENCOUNTER — Encounter: Payer: Self-pay | Admitting: Internal Medicine

## 2010-10-29 ENCOUNTER — Encounter: Payer: Self-pay | Admitting: *Deleted

## 2010-10-29 ENCOUNTER — Encounter: Payer: Self-pay | Admitting: Internal Medicine

## 2010-10-30 ENCOUNTER — Ambulatory Visit: Admit: 2010-10-30 | Payer: Self-pay

## 2010-10-30 ENCOUNTER — Encounter: Payer: Self-pay | Admitting: Internal Medicine

## 2010-10-31 ENCOUNTER — Encounter
Admission: RE | Admit: 2010-10-31 | Discharge: 2010-10-31 | Payer: Self-pay | Source: Home / Self Care | Attending: Diagnostic Neuroimaging | Admitting: Diagnostic Neuroimaging

## 2010-11-07 NOTE — Progress Notes (Signed)
Summary: refill/gg  Phone Note Refill Request  on July 02, 2007 11:32 AM  Refills Requested: Medication #1:  COZAAR 100 MG TABS Take 1 tablet by mouth once a day   Last Refilled: 05/23/2007 6/08    Creatinine                0.73 mg/dL       Method Requested: electronic Initial call taken by: Merrie Roof RN,  July 02, 2007 11:32 AM  Follow-up for Phone Call        Done. Follow-up by: Ned Grace MD,  July 02, 2007 12:21 PM      Prescriptions: COZAAR 100 MG TABS (LOSARTAN POTASSIUM) Take 1 tablet by mouth once a day  #30 x 3   Entered and Authorized by:   Harriett Sine Phifer MD   Signed by:   Ned Grace MD on 07/02/2007   Method used:   Electronically sent to ...       [facility] [address]*       [address] [place], [place]  [address]       Ph: [phone]       Fax: [phone]   RxID:   [id number]

## 2010-11-07 NOTE — Procedures (Signed)
Summary: [facility]: Colonoscopy  [facility]: Colonoscopy   Imported By: Florinda Marker 04/06/2008 14:46:43  _____________________________________________________________________  External Attachment:    Type:   Image     Comment:   External Document  Appended Document: [facility]: Colonoscopy   Colonoscopy  Procedure date:  03/22/2008  Findings:      One 4 mm polyp in the proximal ascending colon, resected and retrieved.  Two 3-5 mm polyps in the proximal ascending colon, resected and retrieved. Pathology results pending. Exam done by Dr.Vincent Bosie Clos.

## 2010-11-07 NOTE — Assessment & Plan Note (Signed)
Summary: FU/EST/VS   Vital Signs:  Patient Profile:   55 Years Old Female Height:     67 inches (170.18 cm) Weight:      189.3 pounds (86.05 kg) BMI:     29.76 Temp:     96.8 degrees F (36 degrees C) oral Pulse rate:   84 / minute BP sitting:   146 / 87  (right arm)  Pt. in pain?   no  Vitals Entered By: Stanton Kidney Ditzler RN (January 01, 2008 10:52 AM)              Is Patient Diabetic? Yes  Nutritional Status BMI of 25 - 29 = overweight Nutritional Status Detail fair CBG Result 252  Have you ever been in a relationship where you felt threatened, hurt or afraid?denies   Does patient need assistance? Functional Status Self care Ambulation Normal     PCP:  Dellia Beckwith MD  Chief Complaint:  Ck-up  and nausea from new DM med - stopped went back to old med. CBG machine broke.Marland Kitchen  History of Present Illness: This is a 55 year old woman with a past medical history of Diabetes mellitus, type II Hypertension Headache Tobacco use Hyperlipidemia Allergic rhinitis Hx leg cramps on Lipitor GERD Uterine fibroids Hx chest pain : exercise stress test negative 06-07 Perimenopausal Insomnia External hemorrhoids Menopause Atrophic vaginitis   who is coming for a blood pressure recheck after starting amlodipine.  She unfortunately says she only took it for 2 days because it made her sick.  This is unusual for amlodipine to cause nausea, so when I tried to figure out exactly what she was or wasn't taking, she really couldn't tell me any names of any medications, doesn't remember what she filled last month as far as the 5 new scripts I had given her and was very confused.  She didn't bring any of her bottles with her.  She also states she went back down to the original dose of her diabetes pills because "the big one made her sick", which likely means metformin.  I have no idea if she is taking amlodipine or glipizide, and it sounds like she is taking metformin 500 mg two times a  day. She also says she broke her glucose meter but has no idea what kind of meter it was and cannot tell me what it looked like.  The amitryptiline is helping her insomnia.    Updated Prior Medication List: HYDROCHLOROTHIAZIDE 25 MG TABS (HYDROCHLOROTHIAZIDE) Take 1 tablet by mouth once a day COZAAR 100 MG TABS (LOSARTAN POTASSIUM) Take 1 tablet by mouth once a day ASPIRIN 81 MG TBEC (ASPIRIN) Take 1 tablet by mouth once a day METFORMIN HCL 1000 MG  TABS (METFORMIN HCL) Take 1 tablet by mouth two times a day NICODERM CQ 14 MG/24HR PT24 (NICOTINE) Apply one patch daily TYLENOL 8 HOUR 650 MG  TBCR (ACETAMINOPHEN) Take 1 tablet by mouth three times a day PREMARIN 0.625 MG/GM  CREA (ESTROGENS, CONJUGATED) Apply 0.5 mg intravaginally daily PROTONIX 40 MG  PACK (PANTOPRAZOLE SODIUM) Take one tablet by mouth daily ALLERMAX 25 MG  TABS (DIPHENHYDRAMINE HCL) one tablet every 12 hours as needed for nasal congestion. HYDROXYZINE HCL 25 MG  TABS (HYDROXYZINE HCL) take one tab once every 6-8 hours as needed for itching.  Do not take with benadryl EUCERIN   LOTN (EMOLLIENT) apply to affected area once every four hours as needed for itching FLUCONAZOLE 150 MG  TABS (FLUCONAZOLE) Take 1 tablet by  mouth once a day X 1 AMLODIPINE BESYLATE 5 MG  TABS (AMLODIPINE BESYLATE) Take 1 tablet by mouth once a day GLIPIZIDE 5 MG  TB24 (GLIPIZIDE) Take 1 tablet by mouth once a day AMITRIPTYLINE HCL 25 MG  TABS (AMITRIPTYLINE HCL) Take 1 tablet by mouth at bedtime  Took amlodipine and increased dose of metformin for 2 days only and stopped afterwards because of nausea.  Current Allergies (reviewed today): ! CODEINE ! VICODIN ! DOXYCYCLINE HYCLATE (DOXYCYCLINE HYCLATE)    Risk Factors:    Review of Systems  General      Denies chills, fever, loss of appetite, and sleep disorder.  CV      Denies chest pain or discomfort, shortness of breath with exertion, and swelling of feet.  Resp      Denies cough and  shortness of breath.  GI      Denies abdominal pain, bloody stools, change in bowel habits, constipation, loss of appetite, nausea, and vomiting.  Neuro      Denies headaches.   Physical Exam  General:     alert, well-developed, well-nourished, and well-hydrated.   Head:     normocephalic and atraumatic.   Eyes:     vision grossly intact, pupils equal, pupils round, and pupils reactive to light.   Mouth:     fair dentition.   Lungs:     Normal respiratory effort, chest expands symmetrically. Lungs are clear to auscultation, no crackles or wheezes. Heart:     Normal rate and regular rhythm. S1 and S2 normal without gallop, murmur, click, rub or other extra sounds. Abdomen:     Bowel sounds positive,abdomen soft and non-tender without masses, organomegaly or hernias noted. Extremities:     No clubbing, cyanosis, edema, or deformity noted with normal full range of motion of all joints.      Impression & Recommendations:  Problem # 1:  HYPERTENSION (ICD-401.9) I don't know if she has been taking her amlodipine or not.  I told her to go home and check her list of medications compared to the list I gave her with a particular attention to her amlodipine.  She will call me if there is discrepancies.Otherwise, continue current dose for now just in case she actually wasn't taking it.  I urged her to bring her meds next time. Her updated medication list for this problem includes:    Hydrochlorothiazide 25 Mg Tabs (Hydrochlorothiazide) .Marland Kitchen... Take 1 tablet by mouth once a day    Cozaar 100 Mg Tabs (Losartan potassium) .Marland Kitchen... Take 1 tablet by mouth once a day    Amlodipine Besylate 5 Mg Tabs (Amlodipine besylate) .Marland Kitchen... Take 1 tablet by mouth once a day   Problem # 2:  DIABETES MELLITUS, TYPE II (ICD-250.00) I don't know if she was taking glipizide, and think she was taking metformin 500 mg two times a day.  I wil continue her on the current regimen as she had nausea with increased  metformin, and folow-up with her CBGs next time, and increase her glipizide if need be.  She will call me this afternoon to let me know what meter she uses as she broke hers and doesn't know what kind it is. Her updated medication list for this problem includes:    Cozaar 100 Mg Tabs (Losartan potassium) .Marland Kitchen... Take 1 tablet by mouth once a day    Aspirin 81 Mg Tbec (Aspirin) .Marland Kitchen... Take 1 tablet by mouth once a day    Metformin Hcl 500 Mg Tabs (  Metformin hcl) .Marland Kitchen... Take 1 tablet by mouth two times a day    Glipizide 5 Mg Tb24 (Glipizide) .Marland Kitchen... Take 1 tablet by mouth once a day  Orders: Capillary Blood Glucose ([address]) Fingerstick (60454)   Complete Medication List: 1)  Hydrochlorothiazide 25 Mg Tabs (Hydrochlorothiazide) .... Take 1 tablet by mouth once a day 2)  Cozaar 100 Mg Tabs (Losartan potassium) .... Take 1 tablet by mouth once a day 3)  Aspirin 81 Mg Tbec (Aspirin) .... Take 1 tablet by mouth once a day 4)  Metformin Hcl 500 Mg Tabs (Metformin hcl) .... Take 1 tablet by mouth two times a day 5)  Nicoderm Cq 14 Mg/24hr Pt24 (Nicotine) .... Apply one patch daily 6)  Tylenol 8 Hour 650 Mg Tbcr (Acetaminophen) .... Take 1 tablet by mouth three times a day 7)  Premarin 0.625 Mg/gm Crea (Estrogens, conjugated) .... Apply 0.5 mg intravaginally daily 8)  Protonix 40 Mg Pack (Pantoprazole sodium) .... Take one tablet by mouth daily 9)  Allermax 25 Mg Tabs (Diphenhydramine hcl) .... One tablet every 12 hours as needed for nasal congestion. 10)  Hydroxyzine Hcl 25 Mg Tabs (Hydroxyzine hcl) .... Take one tab once every 6-8 hours as needed for itching.  do not take with benadryl 11)  Eucerin Lotn (Emollient) .... Apply to affected area once every four hours as needed for itching 12)  Fluconazole 150 Mg Tabs (Fluconazole) .... Take 1 tablet by mouth once a day x 1 13)  Amlodipine Besylate 5 Mg Tabs (Amlodipine besylate) .... Take 1 tablet by mouth once a day 14)  Glipizide 5 Mg Tb24 (Glipizide)  .... Take 1 tablet by mouth once a day 15)  Amitriptyline Hcl 25 Mg Tabs (Amitriptyline hcl) .... Take 1 tablet by mouth at bedtime   Patient Instructions: 1)  Please schedule a follow-up appointment in 1 month.      Prescriptions: METFORMIN HCL 500 MG  TABS (METFORMIN HCL) Take 1 tablet by mouth two times a day  #60 x 12   Entered and Authorized by:   Dellia Beckwith MD   Signed by:   Dellia Beckwith MD on 01/01/2008   Method used:   Electronically sent to ...       [facility] [address]       [address]       [place], [place]  [address]       Ph: [phone]       Fax: [phone]   RxID:   [id number]  ]

## 2010-11-07 NOTE — Progress Notes (Signed)
Summary: refill/ hla  Phone Note Refill Request Message from:  Patient on July 23, 2008 12:06 PM  Refills Requested: Medication #1:  altarussin pt was [facility] and given a cough syrup that she cannot afford she would like what she has used before this was removed from her med list in sept 09, she states they told her she has bronchitis  Initial call taken by: Marin Roberts RN,  July 23, 2008 12:09 PM  Follow-up for Phone Call        I have never heard of altarussin.  Please ask her pharmacy to suggest something equivalent that is cheap.  Most cough syrups are OTC. Follow-up by: Ulyess Mort MD,  July 23, 2008 12:21 PM

## 2010-11-07 NOTE — Assessment & Plan Note (Signed)
Summary: DR. DUGUAY/ BAD COLD/ SB.   Vital Signs:  Patient Profile:   56 Years Old Female Height:     67 inches (170.18 cm) Weight:      191.03 pounds BMI:     30.03 Temp:     97.4 degrees F oral Pulse rate:   84 / minute BP sitting:   154 / 102  (right arm)  Pt. in pain?   yes    Location:   chest    Intensity:   7    Type:       burning  Vitals Entered By: Angelina Ok RN (August 15, 2007 10:11 AM)              Is Patient Diabetic? Yes   Have you ever been in a relationship where you felt threatened, hurt or afraid?No   Does patient need assistance? Functional Status Self care Ambulation Normal     PCP:  Dellia Beckwith MD  Chief Complaint:  Coughing up white phlegm and no fevers.  History of Present Illness: Rhinorrhea, HA's, andlright ear aches. cough dry X1 month.  Current Allergies: ! CODEINE ! VICODIN    Risk Factors:     Counseled to quit/cut down tobacco use:  yes    Physical Exam  Ears:     Right TM is red with some pus in the inner ear; no perforations  Left TM is red w/o pusno external deformities.      Impression & Recommendations:  Problem # 1:  OTITIS MEDIA, PURULENT, ACUTE (ICD-382.00) I will treat her with amoxicillin samples given told her to take all the abx regardless how she feels.  Her updated medication list for this problem includes:    Aspirin 81 Mg Tbec (Aspirin) .Marland Kitchen... Take 1 tablet by mouth once a day    Tylenol 8 Hour 650 Mg Tbcr (Acetaminophen) .Marland Kitchen... Take 1 tablet by mouth three times a day    E.e.s. 400 400 Mg Tabs (Erythromycin ethylsuccinate) .Marland Kitchen... Take 1 tablet by mouth four times a day    Amoxicillin 500 Mg Tabs (Amoxicillin) .Marland Kitchen... Take 1 tablet by mouth three times a day   Complete Medication List: 1)  Hydrochlorothiazide 25 Mg Tabs (Hydrochlorothiazide) .... Take 1 tablet by mouth once a day 2)  Cozaar 100 Mg Tabs (Losartan potassium) .... Take 1 tablet by mouth once a day 3)  Aspirin 81 Mg Tbec  (Aspirin) .... Take 1 tablet by mouth once a day 4)  Metformin Hcl 500 Mg Tabs (Metformin hcl) .... Take 1 tablet by mouth two times a day 5)  Nicoderm Cq 14 Mg/24hr Pt24 (Nicotine) .... Apply one patch daily 6)  Tylenol 8 Hour 650 Mg Tbcr (Acetaminophen) .... Take 1 tablet by mouth three times a day 7)  Cyclobenzaprine Hcl 10 Mg Tabs (Cyclobenzaprine hcl) .... Take 1 tablet by mouth every 8 hours as needed for muscle spasm 8)  Premarin 0.625 Mg/gm Crea (Estrogens, conjugated) .... Apply 0.5 mg intravaginally daily 9)  E.e.s. 400 400 Mg Tabs (Erythromycin ethylsuccinate) .... Take 1 tablet by mouth four times a day 10)  Protonix 40 Mg Pack (Pantoprazole sodium) .... Take one tablet by mouth daily 11)  Lyrica 75 Mg Caps (Pregabalin) .... Take 1 tablet by mouth once a day 12)  Amoxicillin 500 Mg Tabs (Amoxicillin) .... Take 1 tablet by mouth three times a day 13)  Tussionex Pennkinetic Er 8-10 Mg/77ml Lqcr (Chlorpheniramine-hydrocodone) .... 5ml every 12 hours as needed for cough 14)  Allermax 25 Mg Tabs (Diphenhydramine hcl) .... One tablet every 12 hours as needed for nasal congestion. 15)  Fluconazole 150 Mg Tabs (Fluconazole) .... Take one pill for vaginal yeast infection  Other Orders: T- Capillary Blood Glucose (82948) T-Hgb A1C (in-house) (16109UE)   Patient Instructions: 1)  Please schedule a follow-up appointment as needed. 2)  Please finish all your antibiotics (Amoxicillin).    Prescriptions: FLUCONAZOLE 150 MG  TABS (FLUCONAZOLE) take one pill for vaginal yeast infection  #1 x 0   Entered and Authorized by:   Hollace Hayward MD   Signed by:   Hollace Hayward MD on 08/15/2007   Method used:   Electronically sent to ...       [facility] [address]       [address]       [place], [place]  [address]       Ph: [phone]       Fax: [phone]   RxID:   [id number] Sandria Senter ER 8-10 MG/5ML  LQCR (CHLORPHENIRAMINE-HYDROCODONE) 5ml  every 12 hours as needed for cough  #30 ml x 0   Entered and Authorized by:   Hollace Hayward MD   Signed by:   Hollace Hayward MD on 08/15/2007   Method used:   Print then Give to Patient   RxID:   [id number] ALLERMAX 25 MG  TABS (DIPHENHYDRAMINE HCL) one tablet every 12 hours as needed for nasal congestion.  #60 x 0   Entered and Authorized by:   Hollace Hayward MD   Signed by:   Hollace Hayward MD on 08/15/2007   Method used:   Electronically sent to ...       [facility] [address]       [address] [place], [place]  [address]       Ph: [phone]       Fax: [phone]   RxID:   [id number]  ]  Appended Document: DR. DUGUAY/ BAD COLD/ SB. established level II

## 2010-11-07 NOTE — Assessment & Plan Note (Signed)
Summary: ACUTE-VAGAINAL DISCOMFORT/ITCHING/BURNING/CFB   Vital Signs:  Patient profile:   55 year old female Height:      67 inches (170.18 cm) Weight:      179.1 pounds (81.41 kg) BMI:     28.15 Temp:     96.5 degrees F (35.83 degrees C) oral Pulse rate:   82 / minute BP sitting:   156 / 93  (right arm)  Vitals Entered By: Chinita Pester RN (Feb 28, 2009 1:36 PM) CC: Vaginal itching; needs refill on Premarin cream Is Patient Diabetic? Yes  Pain Assessment Patient in pain? yes     Location: rt side.groin Intensity: 3 Type: aching Onset of pain  Intermittent Nutritional Status BMI of 25 - 29 = overweight CBG Result 153  Does patient need assistance? Functional Status Self care Ambulation Normal   Primary Care Provider:  Blondell Reveal MD  CC:  Vaginal itching; needs refill on Premarin cream.  History of Present Illness: [facility] is a 55 y/o woman with DM-II, HTN, HLPD and a recent episode of Bell's palsy who presents to the opc for the 8th time in the last 5 months. This is her 2nd visit since then for vaginal itching. She has had recurrent complaints of vaginal itching in the past. At some point, it was thought that she was perimenopausal and she was even started on a vaginal estrogen cream. Pt has run out of it and she wants a refill b/c it certainly helped with the itching in the past. She spots every month but doesn't bleed much. Not much leukorrhea. Gets frequent hot flashes.  Preventive Screening-Counseling & Management     Alcohol drinks/day: 0     Smoking Status: current     Smoking Cessation Counseling: yes     Packs/Day: 6 cigs/day     Year Started: at the age of 49     Passive Smoke Exposure: yes     Does Patient Exercise: no     Type of exercise: walkiing     Times/week: 2  Allergies: 1)  ! Codeine 2)  ! Vicodin 3)  ! Doxycycline Hyclate (Doxycycline Hyclate)  Past History:  Past Medical History:    L sided Bell's palsy 01/2009    Diabetes  mellitus, type II    Hypertension    Headache    Tobacco use    Hyperlipidemia    Allergic rhinitis    Hx leg cramps on Lipitor    GERD    Uterine fibroids    Hx chest pain : exercise stress test negative 06-07    Perimenopausal ?    Insomnia    External hemorrhoids  Social History:    Packs/Day:  6 cigs/day  Review of Systems General:  Denies chills and fever. Eyes:  Denies blurring and double vision. CV:  Denies difficulty breathing at night and shortness of breath with exertion. Resp:  Denies cough, shortness of breath, sputum productive, and wheezing. GI:  Denies change in bowel habits, nausea, and vomiting. GU:  Denies dysuria, genital sores, and hematuria. MS:  Denies joint redness and joint swelling. Derm:  Denies itching, lesion(s), and rash. Neuro:  Denies falling down and headaches. Heme:  Denies abnormal bruising and bleeding. Allergy:  Complains of seasonal allergies and sneezing.  Physical Exam  General:  Middle-aged woman in NAD. Complete physical exam was not repeated since she was fully examined at least 3 times in the last 6 weeks. Head:  atraumatic.   Eyes:  vision grossly intact,  pupils equal, pupils round, and pupils reactive to light.   Ears:  no external deformities.   Nose:  no external deformity.   Mouth:  Poor dentition. Abdomen:  soft, non-tender, normal bowel sounds, no distention, no masses, no guarding, and no rigidity.   Genitalia:  normal introitus, no external lesions, mucosa pink and moist, no vaginal or cervical lesions, no vaginal atrophy, and no friaility or hemorrhage. Thin whitish discharge w/o odour. Neurologic:  alert & oriented X3. L Bell's palsy resolving.alert & oriented X3 and gait normal.   Skin:  no rashes and no suspicious lesions.   Psych:  Oriented X3, memory intact for recent and remote, and normally interactive.     Impression & Recommendations:  Problem # 1:  LEUKORRHEA (ICD-623.5) Will send specimen for GC chlam and  wet prep. She is due for a Pap smear so I did one today. For her pruritus, I suspect it will be related to her leukorrhea (i.e. wet prep will help sport out the dx). However, she is NOT menopaused and doesn't have any vaginal atrophy. Therefore, there is no need to give her a vaginal estrogen cream such as Premarin (she received a script for it in 2008 from our clinic).  Orders: T-Wet Prep by [facility] [id number]) T- GC Chlamydia ([address]) T-Pap Smear, Thin Prep (91478)  Problem # 2:  HYPERTENSION (ICD-401.9) Usually at goal but she is stressed out and uncomfortable today. Will not make any changes.  Her updated medication list for this problem includes:    Hyzaar 100-25 Mg Tabs (Losartan potassium-hctz) .Marland Kitchen... Take 1 tablet by mouth once a day    Catapres 0.1 Mg Tabs (Clonidine hcl) .Marland Kitchen... Take 1 tab by mouth at bedtime  BP today: 156/93 Prior BP: 129/81 (02/14/2009)  Labs Reviewed: K+: 4.1 (01/05/2009) Creat: : 0.74 (01/05/2009)   Chol: 155 (01/05/2009)   HDL: 44 (01/05/2009)   LDL: 94 (01/05/2009)   TG: 83 (01/05/2009)  Her updated medication list for this problem includes:    Hyzaar 100-25 Mg Tabs (Losartan potassium-hctz) .Marland Kitchen... Take 1 tablet by mouth once a day    Catapres 0.1 Mg Tabs (Clonidine hcl) .Marland Kitchen... Take 1 tab by mouth at bedtime  Problem # 3:  BELL'S PALSY, LEFT (ICD-351.0) Resolving.  Problem # 4:  DIABETES MELLITUS, TYPE II (ICD-250.00) At goal.  Her updated medication list for this problem includes:    Hyzaar 100-25 Mg Tabs (Losartan potassium-hctz) .Marland Kitchen... Take 1 tablet by mouth once a day    Aspirin 81 Mg Tbec (Aspirin) .Marland Kitchen... Take 1 tablet by mouth once a day    Janumet 50-500 Mg Tabs (Sitagliptin-metformin hcl) .Marland Kitchen... Take 1 tablet by mouth two times a day  Orders: Fingerstick (29562)  Labs Reviewed: Creat: 0.74 (01/05/2009)     Last Eye Exam: No diabetic retinopathy.    (02/08/2009) Reviewed HgBA1c results: 7.2 (01/05/2009)  6.9 (09/21/2008)   Complete Medication List: 1)  Hyzaar 100-25 Mg Tabs (Losartan potassium-hctz) .... Take 1 tablet by mouth once a day 2)  Aspirin 81 Mg Tbec (Aspirin) .... Take 1 tablet by mouth once a day 3)  Janumet 50-500 Mg Tabs (Sitagliptin-metformin hcl) .... Take 1 tablet by mouth two times a day 4)  Tylenol 8 Hour 650 Mg Tbcr (Acetaminophen) .... Take 1 tablet by mouth three times a day 5)  Freestyle Lite Test Strp (Glucose blood) .... Use to test blood glucsoe once time a day 6)  Lancets Misc (Lancets) .... Use to test blood glucose one time  a day 7)  Freestyle Control Solution Liqd (Blood glucose calibration) .... Use to calibrate meter and assure quality readings. 8)  Catapres 0.1 Mg Tabs (Clonidine hcl) .... Take 1 tab by mouth at bedtime 9)  Zyrtec-d Allergy & Congestion 5-120 Mg Xr12h-tab (Cetirizine-pseudoephedrine) .... Take 1 tablet by mouth two times a day 10)  Prodigy No Coding Blood Gluc Strp (Glucose blood) .... Use to check your sugar 1-2 times a day 11)  Prodigy Blood Glucose Monitor Devi (Blood glucose monitoring suppl) .... Use to check your blood sugar 12)  Nicotrol 10 Mg Inha (Nicotine) .... Use as often as needed whenever you have urge to smoke cig. use a minimum of 6 cartiges per day and a maximum of 16. 13)  Hydroxyzine Hcl 25 Mg Tabs (Hydroxyzine hcl) .... Take one tab by mouth every 6 to 8 hours as needed for itching.  Patient Instructions: 1)  I will call you with the results of your tests. Depending on what your results show, I'll let you know if you need any treatment.

## 2010-11-07 NOTE — Progress Notes (Signed)
Summary: phone/gg  Phone Note Call from Patient Call back at Home Phone [phone]   Caller: Patient Summary of Call: [facility] called and states the claritin is not helping her.  She would like a referral to an ear specialist, she said this was talked about at her last visit.  Initial call taken by: Merrie Roof RN,  April 29, 2008 10:20 AM  Follow-up for Phone Call       Follow-up by: Blondell Reveal MD,  April 29, 2008 2:02 PM  Additional Follow-up for Phone Call Additional follow up Details #1::        I agree for ENT referral.

## 2010-11-07 NOTE — Assessment & Plan Note (Signed)
Summary: F/U WITH HANDS/CFB   Vital Signs:  Patient profile:   55 year old female Height:      65 inches Weight:      188.1 pounds BMI:     31.41 Temp:     97.6 degrees F oral Pulse rate:   82 / minute BP sitting:   136 / 82  (right arm)  Vitals Entered By: Filomena Jungling NT II (Feb 23, 2010 1:43 PM) CC: THUMB NO BETTER/  Is Patient Diabetic? Yes Did you bring your meter with you today? No Pain Assessment Patient in pain? yes     Location: THUMB Intensity: 8 Type: aching Onset of pain  1 YEAR Nutritional Status BMI of > 30 = obese  Have you ever been in a relationship where you felt threatened, hurt or afraid?No   Does patient need assistance? Functional Status Self care Ambulation Normal   Primary Care Provider:  Blondell Reveal MD  CC:  THUMB NO BETTER/ .  History of Present Illness: 55 yo female w/ PMH as mentioned in the EMR  comes to the office for a follow up visit.  1. I saw the patient early this month for C/O Right 1st finger pain at MCP joint. 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 Patient reports that she has been taking Tylenol for pain which hasn't been helping her.  2. Patient reports that she has noticed a small knot under the right breast and reports that it has been itching her a lot. She has seen the dermatologist for this and was not prescribed any cream.  She denies any other complaints. She reports compliancy to all her meds.     Preventive Screening-Counseling & Management  Alcohol-Tobacco     Alcohol drinks/day: 0     Smoking Status: current     Smoking Cessation Counseling: yes     Packs/Day: 1.0     Year Started: at the age of 55     Passive Smoke Exposure: yes  Caffeine-Diet-Exercise     Does Patient Exercise: no     Type of exercise: walkiing     Times/week: 2  Problems Prior to Update: 1)  Trigger Finger, Right Thumb  (ICD-727.03) 2)  Sebaceous Cyst, Breast  (ICD-610.8) 3)   Preventive Health Care  (ICD-V70.0) 4)  Cigarette Smoker  (ICD-305.1) 5)  Insomnia  (ICD-780.52) 6)  Bell's Palsy, Left  (ICD-351.0) 7)  Allergic Rhinitis  (ICD-477.9) 8)  Adenomatous Colonic Polyp  (ICD-211.3) 9)  Diabetes Mellitus, Type II  (ICD-250.00) 10)  Hypertension  (ICD-401.9) 11)  Hypertensive Retinopathy  (ICD-362.11) 12)  Dyslipidemia  (ICD-272.4) 13)  Pruritus  (ICD-698.9) 14)  Diabetic Peripheral Neuropathy  (ICD-250.60) 15)  Health Maintenance Exam  (ICD-V70.0) 16)  Gerd  (ICD-530.81) 17)  Hx of Leg Cramps  (ICD-729.82) 18)  Fibroids, Uterus  (ICD-218.9) 19)  Perimenopausal Status  (ICD-627.2) 20)  External Hemorrhoids  (ICD-455.3)  Medications Prior to Update: 1)  Hyzaar 100-25 Mg Tabs (Losartan Potassium-Hctz) .... Take 1 Tablet By Mouth Once A Day 2)  Aspirin 81 Mg Tbec (Aspirin) .... Take 1 Tablet By Mouth Once A Day 3)  Janumet 50-500 Mg Tabs (Sitagliptin-Metformin Hcl) .... Take 1 Tablet By Mouth Two Times A Day 4)  Freestyle Lite Test   Strp (Glucose Blood) .... Use To Test Blood Glucsoe Once Time A Day 5)  Lancets   Misc (Lancets) .... Use To Test Blood Glucose One Time A Day 6)  Freestyle Control Solution   Liqd (Blood Glucose Calibration) .... Use To Calibrate Meter and Assure Quality Readings. 7)  Catapres 0.1 Mg Tabs (Clonidine Hcl) .... Take 2 Tablets By Mouth At Bedtime 8)  Prodigy No Coding Blood Gluc  Strp (Glucose Blood) .... Use To Check Your Sugar 1-2 Times A Day 9)  Prodigy Blood Glucose Monitor  Devi (Blood Glucose Monitoring Suppl) .... Use To Check Your Blood Sugar 10)  Premarin 0.625 Mg/gm Crea (Estrogens, Conjugated) .... Apply Once Daily For 2 Weeks. Twice Weekly Thereafter 11)  Claritin 10 Mg Tabs (Loratadine) .... Take 1 Tablet By Mouth Once A Day 12)  Cortisporin 3.5-10000-1 Soln (Neomycin-Polymyxin-Hc) .... 4 Drops Three Times A Day Into Affected Ear. 13)  Pravachol 40 Mg Tabs (Pravastatin Sodium) .... Take 1 Tablet By Mouth Once A Day At  Bedtime 14)  Fluticasone Propionate 50 Mcg/act Susp (Fluticasone Propionate) .... 2 Spray Inside Each Nostril Daily. 15)  Nexium 40 Mg Cpdr (Esomeprazole Magnesium) .... Take 1 Tablet By Mouth Once A Day 16)  Pepcid 20 Mg Tabs (Famotidine) .... Take 1 Tablet By Mouth Two Times A Day 17)  Diphenhydramine Hcl 25 Mg Caps (Diphenhydramine Hcl) .... Take 2 Tabs By Mouth Every 6 Hours. 18)  Tylenol Extra Strength 500 Mg Tabs (Acetaminophen) .... Take 2 Tabs By Mouth Ever 8 Hours As Needed For Pain.  Current Medications (verified): 1)  Hyzaar 100-25 Mg Tabs (Losartan Potassium-Hctz) .... Take 1 Tablet By Mouth Once A Day 2)  Aspirin 81 Mg Tbec (Aspirin) .... Take 1 Tablet By Mouth Once A Day 3)  Janumet 50-500 Mg Tabs (Sitagliptin-Metformin Hcl) .... Take 1 Tablet By Mouth Two Times A Day 4)  Freestyle Lite Test   Strp (Glucose Blood) .... Use To Test Blood Glucsoe Once Time A Day 5)  Lancets   Misc (Lancets) .... Use To Test Blood Glucose One Time A Day 6)  Freestyle Control Solution   Liqd (Blood Glucose Calibration) .... Use To Calibrate Meter and Assure Quality Readings. 7)  Catapres 0.1 Mg Tabs (Clonidine Hcl) .... Take 2 Tablets By Mouth At Bedtime 8)  Prodigy No Coding Blood Gluc  Strp (Glucose Blood) .... Use To Check Your Sugar 1-2 Times A Day 9)  Prodigy Blood Glucose Monitor  Devi (Blood Glucose Monitoring Suppl) .... Use To Check Your Blood Sugar 10)  Premarin 0.625 Mg/gm Crea (Estrogens, Conjugated) .... Apply Once Daily For 2 Weeks. Twice Weekly Thereafter 11)  Claritin 10 Mg Tabs (Loratadine) .... Take 1 Tablet By Mouth Once A Day 12)  Cortisporin 3.5-10000-1 Soln (Neomycin-Polymyxin-Hc) .... 4 Drops Three Times A Day Into Affected Ear. 13)  Pravachol 40 Mg Tabs (Pravastatin Sodium) .... Take 1 Tablet By Mouth Once A Day At Bedtime 14)  Fluticasone Propionate 50 Mcg/act Susp (Fluticasone Propionate) .... 2 Spray Inside Each Nostril Daily. 15)  Nexium 40 Mg Cpdr (Esomeprazole Magnesium)  .... Take 1 Tablet By Mouth Once A Day 16)  Pepcid 20 Mg Tabs (Famotidine) .... Take 1 Tablet By Mouth Two Times A Day 17)  Diphenhydramine Hcl 25 Mg Caps (Diphenhydramine Hcl) .... Take 2 Tabs By Mouth Every 6 Hours. 18)  Tylenol Extra Strength 500 Mg Tabs (Acetaminophen) .... Take 2 Tabs By Mouth Ever 8 Hours As Needed For Pain.  Allergies (verified): 1)  ! Codeine 2)  ! Vicodin 3)  ! Doxycycline Hyclate (Doxycycline Hyclate) 4)  Penicillin V Potassium (Penicillin V Potassium)  Past History:  Family History: Last updated: 09/23/2008 M: died with  bad cough 1969 @ 57yo, not sure about health history S: died with bad cough 1996 @ 55yo, of pneumonia  Social History: Last updated: 11/12/2006 Current Smoker  Risk Factors: Smoking Status: current (02/23/2010) Packs/Day: 1.0 (02/23/2010) Passive Smoke Exposure: yes (02/23/2010)  Past Medical History: Reviewed history from 02/28/2009 and no changes required. L sided Bell's palsy 01/2009 Diabetes mellitus, type II Hypertension Headache Tobacco use Hyperlipidemia Allergic rhinitis Hx leg cramps on Lipitor GERD Uterine fibroids Hx chest pain : exercise stress test negative 06-07 Perimenopausal ? Insomnia External hemorrhoids  Past Surgical History: Reviewed history from 05/11/2009 and no changes required. none  Family History: Reviewed history from 08/31/2008 and no changes required. M: died with bad cough 1969 @ 55yo, not sure about health history S: died with bad cough 1996 @ 55yo, of pneumonia  Social History: Reviewed history from 11/12/2006 and no changes required. Current Smoker  Review of Systems      See HPI  Physical Exam  General:  alert, well-developed, and well-nourished.   Lungs:  normal respiratory effort, no intercostal retractions, no accessory muscle use, and normal breath sounds.   Heart:  normal rate and regular rhythm.   Pulses:  R radial normal.   Extremities:  no edema. Neurologic:  alert  & oriented X3 and gait normal.     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 Not responding to conservative management with NSAID's. Will refer to Sports medicine for possible injection of steroid or removal of loose body.   Her updated medication list for this problem includes:    Aspirin 81 Mg Tbec (Aspirin) .Marland Kitchen... Take 1 tablet by mouth once a day    Tylenol Extra Strength 500 Mg Tabs (Acetaminophen) .Marland Kitchen... Take 2 tabs by mouth ever 8 hours as needed for pain.  Problem # 2:  HYPERTENSION (ICD-401.9) Well controlled. Continue current regimen.  Her updated medication list for this problem includes:    Hyzaar 100-25 Mg Tabs (Losartan potassium-hctz) .Marland Kitchen... Take 1 tablet by mouth once a day    Catapres 0.1 Mg Tabs (Clonidine hcl) .Marland Kitchen... Take 2 tablets by mouth at bedtime  BP today: 136/82 Prior BP: 154/85 (02/09/2010)  Labs Reviewed: K+: 4.2 (11/17/2009) Creat: : 0.77 (11/17/2009)   Chol: 236 (11/17/2009)   HDL: 50 (11/17/2009)   LDL: 129 (11/17/2009)   TG: 284 (11/17/2009)  Problem # 3:  DIABETES MELLITUS, TYPE II (ICD-250.00) Well controlled. Continue current regimen.  Her updated medication list for this problem includes:    Hyzaar 100-25 Mg Tabs (Losartan potassium-hctz) .Marland Kitchen... Take 1 tablet by mouth once a day    Aspirin 81 Mg Tbec (Aspirin) .Marland Kitchen... Take 1 tablet by mouth once a day    Janumet 50-500 Mg Tabs (Sitagliptin-metformin hcl) .Marland Kitchen... Take 1 tablet by mouth two times a day  Labs Reviewed: Creat: 0.77 (11/17/2009)     Last Eye Exam: No diabetic retinopathy.    (02/08/2009) Reviewed HgBA1c results: 6.9 (02/09/2010)  6.2 (11/16/2009)  Problem # 4:  CIGARETTE SMOKER (ICD-305.1) Counselled her regarding smoking cessation. Patient is not interested in stopping at this time.  Complete Medication List: 1)  Hyzaar 100-25 Mg Tabs (Losartan  potassium-hctz) .... Take 1 tablet by mouth once a day 2)  Aspirin 81 Mg Tbec (Aspirin) .... Take 1 tablet by mouth once a day 3)  Janumet 50-500 Mg Tabs (Sitagliptin-metformin hcl) .... Take 1 tablet by mouth two  times a day 4)  Freestyle Lite Test Strp (Glucose blood) .... Use to test blood glucsoe once time a day 5)  Lancets Misc (Lancets) .... Use to test blood glucose one time a day 6)  Freestyle Control Solution Liqd (Blood glucose calibration) .... Use to calibrate meter and assure quality readings. 7)  Catapres 0.1 Mg Tabs (Clonidine hcl) .... Take 2 tablets by mouth at bedtime 8)  Prodigy No Coding Blood Gluc Strp (Glucose blood) .... Use to check your sugar 1-2 times a day 9)  Prodigy Blood Glucose Monitor Devi (Blood glucose monitoring suppl) .... Use to check your blood sugar 10)  Premarin 0.625 Mg/gm Crea (Estrogens, conjugated) .... Apply once daily for 2 weeks. twice weekly thereafter 11)  Claritin 10 Mg Tabs (Loratadine) .... Take 1 tablet by mouth once a day 12)  Cortisporin 3.5-10000-1 Soln (Neomycin-polymyxin-hc) .... 4 drops three times a day into affected ear. 13)  Pravachol 40 Mg Tabs (Pravastatin sodium) .... Take 1 tablet by mouth once a day at bedtime 14)  Fluticasone Propionate 50 Mcg/act Susp (Fluticasone propionate) .... 2 spray inside each nostril daily. 15)  Nexium 40 Mg Cpdr (Esomeprazole magnesium) .... Take 1 tablet by mouth once a day 16)  Pepcid 20 Mg Tabs (Famotidine) .... Take 1 tablet by mouth two times a day 17)  Diphenhydramine Hcl 25 Mg Caps (Diphenhydramine hcl) .... Take 2 tabs by mouth every 6 hours. 18)  Tylenol Extra Strength 500 Mg Tabs (Acetaminophen) .... Take 2 tabs by mouth ever 8 hours as needed for pain.  Other Orders: Sports Medicine (Sports Med)  Patient Instructions: 1)  Please schedule a follow-up appointment in 3 months. 2)  Take all the medications as advised below. 3)  Tobacco is very bad for your health and your loved ones! You  Should stop smoking!. 4)  Stop Smoking Tips: Choose a Quit date. Cut down before the Quit date. decide what you will do as a substitute when you feel the urge to smoke(gum,toothpick,exercise).

## 2010-11-07 NOTE — Progress Notes (Signed)
Summary: Phone note-medication/gp  Phone Note Call from Patient   Caller: Patient Summary of Call: Ms.Dimitroff states her insurance will not cover the  Hyzaar Rx written 09/21  per her pharmacy. thanks Initial call taken by: Chinita Pester RN,  July 07, 2008 10:20 AM  Follow-up for Phone Call        she needs to come in for  b-met check in 2 weeks. Follow-up by: Marinda Elk MD,  July 08, 2008 2:54 PM    New/Updated Medications: PRINZIDE 20-25 MG TABS (LISINOPRIL-HYDROCHLOROTHIAZIDE) Take 1 tablet by mouth once a day   Prescriptions: PRINZIDE 20-25 MG TABS (LISINOPRIL-HYDROCHLOROTHIAZIDE) Take 1 tablet by mouth once a day  #31 x 11   Entered and Authorized by:   Marinda Elk MD   Signed by:   Marinda Elk MD on 07/08/2008   Method used:   Electronically to        [facility] [id number] (retail)       [address] [place], [place]  [address]       Ph: [phone]       Fax: [phone]   RxID:   [id number]

## 2010-11-07 NOTE — Assessment & Plan Note (Signed)
Summary: severe cough/pcp-boggala/hla   Vital Signs:  Patient Profile:   55 Years Old Female Height:     67 inches (170.18 cm) Weight:      185.3 pounds (84.23 kg) BMI:     29.13 Temp:     97.3 degrees F (36.28 degrees C) oral Pulse rate:   70 / minute BP sitting:   155 / 91  (right arm)  Pt. in pain?   no  Vitals Entered By: Filomena Jungling NT II (August 02, 2008 3:11 PM)              Is Patient Diabetic? No Nutritional Status BMI of 25 - 29 = overweight  Have you ever been in a relationship where you felt threatened, hurt or afraid?No   Does patient need assistance? Functional Status Self care Ambulation Normal     PCP:  Dellia Beckwith MD  Chief Complaint:  Cold & URI symptomsx 1 month urgent care.  History of Present Illness: [name] is a 55 yo lady with PMH as outlined in the EMR. She came today for a f/u visit after and ER visit on 10/12. She was treated with Z pack  for 6 days and cough expectorant since then. She was diagnosed with bronchitis. Her cough has cleared except some occasional ones now. No SOB, CP, fever, chills.  She has some irritation in throat. No HA, diarrhea, N/V.   The pt. says Caduet is causing nausea.     Prior Medications Reviewed Using: Medication Bottles  Updated Prior Medication List: PRINZIDE 20-25 MG TABS (LISINOPRIL-HYDROCHLOROTHIAZIDE) Take 1 tablet by mouth once a day ASPIRIN 81 MG TBEC (ASPIRIN) Take 1 tablet by mouth once a day JANUMET 50-500 MG TABS (SITAGLIPTIN-METFORMIN HCL) Take 1 tablet by mouth two times a day TYLENOL 8 HOUR 650 MG  TBCR (ACETAMINOPHEN) Take 1 tablet by mouth three times a day PROTONIX 40 MG  PACK (PANTOPRAZOLE SODIUM) Take one tablet by mouth daily CADUET 5-20 MG TABS (AMLODIPINE-ATORVASTATIN) Take 1 tablet by mouth once a day AMITRIPTYLINE HCL 25 MG  TABS (AMITRIPTYLINE HCL) Take 1 tablet by mouth at bedtime FREESTYLE LITE TEST   STRP (GLUCOSE BLOOD) use to test blood glucsoe once time a day [BMN]  LANCETS   MISC (LANCETS) use to test blood glucose one time a day FREESTYLE CONTROL SOLUTION   LIQD (BLOOD GLUCOSE CALIBRATION) use to calibrate meter and assure quality readings. [BMN] CLONIDINE HCL 0.1 MG  TABS (CLONIDINE HCL) Take 1 tablet by mouth at bedtime CETIRIZINE HCL 10 MG CHEW (CETIRIZINE HCL) take 1 pill by mouth daily for 2 weeks.  Current Allergies: ! CODEINE ! VICODIN ! DOXYCYCLINE HYCLATE (DOXYCYCLINE HYCLATE)    Risk Factors:  Tobacco use:  current    Year started:  at the age of 48    Cigarettes:  Yes -- 1/2 pack(s) per day    Counseled to quit/cut down tobacco use:  yes Passive smoke exposure:  yes Alcohol use:  no Exercise:  yes    Times per week:  2    Type:  walkiing Seatbelt use:  100 %  Colonoscopy History:    Date of Last Colonoscopy:  03/22/2008  Mammogram History:    Date of Last Mammogram:  03/04/2008  PAP Smear History:    Date of Last PAP Smear:  02/24/2008   Review of Systems      See HPI   Physical Exam  General:     alert.   Nose:  no nasal discharge, no mucosal pallor, and no mucosal edema.   Mouth:     pharynx pink and moist, no erythema, no exudates, no posterior lymphoid hypertrophy, and no postnasal drip.   Neck:     supple and no masses.   Lungs:     normal breath sounds, no crackles, and no wheezes.   Heart:     normal rate, regular rhythm, no murmur, and no gallop.   Abdomen:     soft, non-tender, normal bowel sounds, no distention, and no masses.   Extremities:     trace left pedal edema and trace right pedal edema.   Neurologic:     alert & oriented X3.   Cervical Nodes:     no anterior cervical adenopathy and no posterior cervical adenopathy.      Impression & Recommendations:  Problem # 1:  COUGH (ICD-786.2) Getting Better. Exam today is normal. I will give some claritin and ask the pt. to gargle with salt water to help with throat irritation.   Problem # 2:  HYPERTENSION (ICD-401.9) The pt. says  Cauet is causing nausea, I asked her to take it after meals.  Her updated medication list for this problem includes:    Prinzide 20-25 Mg Tabs (Lisinopril-hydrochlorothiazide) .Marland Kitchen... Take 1 tablet by mouth once a day    Caduet 5-20 Mg Tabs (Amlodipine-atorvastatin) .Marland Kitchen... Take 1 tablet by mouth once a day    Clonidine Hcl 0.1 Mg Tabs (Clonidine hcl) .Marland Kitchen... Take 1 tablet by mouth at bedtime   Complete Medication List: 1)  Prinzide 20-25 Mg Tabs (Lisinopril-hydrochlorothiazide) .... Take 1 tablet by mouth once a day 2)  Aspirin 81 Mg Tbec (Aspirin) .... Take 1 tablet by mouth once a day 3)  Janumet 50-500 Mg Tabs (Sitagliptin-metformin hcl) .... Take 1 tablet by mouth two times a day 4)  Tylenol 8 Hour 650 Mg Tbcr (Acetaminophen) .... Take 1 tablet by mouth three times a day 5)  Protonix 40 Mg Pack (Pantoprazole sodium) .... Take one tablet by mouth daily 6)  Caduet 5-20 Mg Tabs (Amlodipine-atorvastatin) .... Take 1 tablet by mouth once a day 7)  Amitriptyline Hcl 25 Mg Tabs (Amitriptyline hcl) .... Take 1 tablet by mouth at bedtime 8)  Freestyle Lite Test Strp (Glucose blood) .... Use to test blood glucsoe once time a day 9)  Lancets Misc (Lancets) .... Use to test blood glucose one time a day 10)  Freestyle Control Solution Liqd (Blood glucose calibration) .... Use to calibrate meter and assure quality readings. 11)  Clonidine Hcl 0.1 Mg Tabs (Clonidine hcl) .... Take 1 tablet by mouth at bedtime 12)  Cetirizine Hcl 10 Mg Chew (Cetirizine hcl) .... Take 1 pill by mouth daily for 2 weeks.   Patient Instructions: 1)  Please schedule a follow-up appointment in 3 months. 2)  Limit your Sodium (Salt) to less than 2 grams a day(slightly less than 1/2 a teaspoon) to prevent fluid retention, swelling, or worsening of symptoms. 3)  Tobacco is very bad for your health and your loved ones! You Should stop smoking!. 4)  Stop Smoking Tips: Choose a Quit date. Cut down before the Quit date. decide what you  will do as a substitute when you feel the urge to smoke(gum,toothpick,exercise). 5)  It is important that you exercise regularly at least 20 minutes 5 times a week. If you develop chest pain, have severe difficulty breathing, or feel very tired , stop exercising immediately and seek medical attention. 6)  You  need to lose weight. Consider a lower calorie diet and regular exercise.  7)  Check your blood sugars regularly. If your readings are usually above : or below 70 you should contact our office. 8)  It is important that your Diabetic A1c level is checked every 3 months. 9)  See your eye doctor yearly to check for diabetic eye damage. 10)  Check your feet each night for sore areas, calluses or signs of infection. 11)  Check your Blood Pressure regularly. If it is above: you should make an appointment.   Prescriptions: CETIRIZINE HCL 10 MG CHEW (CETIRIZINE HCL) take 1 pill by mouth daily for 2 weeks.  #14 x 2   Entered and Authorized by:   Jason Coop MD   Signed by:   Jason Coop MD on 08/02/2008   Method used:   Electronically to        [facility] [id number] (retail)       [address] [place], [place]  [address]       Ph: [phone]       Fax: [phone]   RxID:   [id number]  ]

## 2010-11-07 NOTE — Assessment & Plan Note (Signed)
Summary: itching, vag since abx/pcp-boggala/hla   Vital Signs:  Patient profile:   55 year old female Height:      65 inches (165.10 cm) Weight:      187.3 pounds (85.14 kg) BMI:     31.28 Temp:     96.8 degrees F (36 degrees C) oral Pulse rate:   75 / minute BP sitting:   164 / 95  (right arm)  Vitals Entered By: Stanton Kidney Ditzler RN (June 01, 2010 9:55 AM) Is Patient Diabetic? Yes Did you bring your meter with you today? No Pain Assessment Patient in pain? yes     Location: right ear Intensity: 8 Type: sharp Onset of pain  past 2 days Nutritional Status BMI of > 30 = obese Nutritional Status Detail appetite good CBG Result 183  Have you ever been in a relationship where you felt threatened, hurt or afraid?denies   Does patient need assistance? Functional Status Self care Ambulation Normal Comments Hot flashes getting worse - has had since age 27. Right ear pain.   Primary Care Provider:  Blondell Reveal MD   History of Present Illness: Patient is here today because she has had right ear pain since yesterday.  Also complaining of hot flashes.  Ear pain:  Pain inside the ear, ache pain.  Patient has had ear aches since she was little.  Usually she does nothing for them.  She had this morning a sharp pain that lasted about 10 minutes and then it stopped. Patient does not use Q-tips.  She has put nothing in her ear.  No drainiage from her ear.  No itching.  She says sometimes her left ear itches.  No fevers, some rhinorrhea with her seasonal allergies.   Hot flashes:  "happen all the time, they make me sick ,make me throw up, keep me awake at night." She says "it feels like I am going to die, like I can't get my breath." LMP was 6 years ago.  It did not help.  There have been no change in these since they started.  She states she also feels irritable with these.  Also has difficulty sleeping.  She says she gets them so frequently that she can't remember going more than 2 hours  without having a hot flash.  She has tried clonidine and gabapentin but they did not work.  She didn't like Ambien which made her feel drugged.    Patient's BP high today - unfortunately she took her meds right before seeing me.  repeat 162/88.    No CP.    Depression History:      The patient denies a depressed mood most of the day and a diminished interest in her usual daily activities.         Preventive Screening-Counseling & Management  Alcohol-Tobacco     Alcohol drinks/day: 0     Smoking Status: current     Smoking Cessation Counseling: yes     Packs/Day: 1/2 ppd     Year Started: at the age of 10     Passive Smoke Exposure: yes  Caffeine-Diet-Exercise     Does Patient Exercise: no     Type of exercise: walkiing     Times/week: 2  Current Problems (verified): 1)  Ear Pain, Right  (ICD-388.70) 2)  Headache  (ICD-784.0) 3)  Hot Flashes  (ICD-627.2) 4)  Osteoarthritis, Carpometacarpal Joint, Right Thumb  (ICD-715.94) 5)  Trigger Finger, Right Thumb  (ICD-727.03) 6)  Sebaceous Cyst, Breast  (ICD-610.8)  7)  Preventive Health Care  (ICD-V70.0) 8)  Cigarette Smoker  (ICD-305.1) 9)  Insomnia  (ICD-780.52) 10)  Bell's Palsy, Left  (ICD-351.0) 11)  Allergic Rhinitis  (ICD-477.9) 12)  Adenomatous Colonic Polyp  (ICD-211.3) 13)  Diabetes Mellitus, Type II  (ICD-250.00) 14)  Hypertension  (ICD-401.9) 15)  Hypertensive Retinopathy  (ICD-362.11) 16)  Dyslipidemia  (ICD-272.4) 17)  Pruritus  (ICD-698.9) 18)  Diabetic Peripheral Neuropathy  (ICD-250.60) 19)  Health Maintenance Exam  (ICD-V70.0) 20)  Hx of Leg Cramps  (ICD-729.82) 21)  Fibroids, Uterus  (ICD-218.9) 22)  Perimenopausal Status  (ICD-627.2) 23)  External Hemorrhoids  (ICD-455.3)  Current Medications (verified): 1)  Hyzaar 100-25 Mg Tabs (Losartan Potassium-Hctz) .... Take 1 Tablet By Mouth Once A Day 2)  Aspirin 81 Mg Tbec (Aspirin) .... Take 1 Tablet By Mouth Once A Day 3)  Janumet 50-500 Mg Tabs  (Sitagliptin-Metformin Hcl) .... Take 1 Tablet By Mouth Two Times A Day 4)  Freestyle Lite Test   Strp (Glucose Blood) .... Use To Test Blood Glucsoe Once Time A Day 5)  Lancets   Misc (Lancets) .... Use To Test Blood Glucose One Time A Day 6)  Freestyle Control Solution   Liqd (Blood Glucose Calibration) .... Use To Calibrate Meter and Assure Quality Readings. 7)  Prodigy No Coding Blood Gluc  Strp (Glucose Blood) .... Use To Check Your Sugar 1-2 Times A Day 8)  Prodigy Blood Glucose Monitor  Devi (Blood Glucose Monitoring Suppl) .... Use To Check Your Blood Sugar 9)  Cortisporin 3.5-10000-1 Soln (Neomycin-Polymyxin-Hc) .... 4 Drops Three Times A Day Into Affected Ear. 10)  Pravachol 40 Mg Tabs (Pravastatin Sodium) .... Take 1 Tablet By Mouth Once A Day At Bedtime 11)  Tylenol Extra Strength 500 Mg Tabs (Acetaminophen) .... Take 2 Tabs By Mouth Ever 8 Hours As Needed For Pain. 12)  Hydrocortisone 1 % Crea (Hydrocortisone) .... Apply As Instructed Under Your Breast 2-3 Times Daily. 13)  Paroxetine Hcl 20 Mg Tabs (Paroxetine Hcl) .... Take 1 Tablet By Mouth Every Morning  Allergies: 1)  ! Codeine 2)  ! Vicodin 3)  ! Doxycycline Hyclate (Doxycycline Hyclate) 4)  Penicillin V Potassium (Penicillin V Potassium)  Past History:  Past Surgical History: Last updated: 05/11/2009 none  Family History: Last updated: September 17, 2008 M: died with bad cough 1969 @ 57yo, not sure about health history S: died with bad cough 1996 @ 57yo, of pneumonia  Social History: Last updated: 11/12/2006 Current Smoker  Risk Factors: Alcohol Use: 0 (06/01/2010) Exercise: no (06/01/2010)  Risk Factors: Smoking Status: current (06/01/2010) Packs/Day: 1/2 ppd (06/01/2010) Passive Smoke Exposure: yes (06/01/2010)  Past Medical History: L sided Bell's palsy 01/2009 Diabetes mellitus, type II Hypertension Headache Tobacco use Hyperlipidemia Allergic rhinitis Hx leg cramps on Lipitor GERD Uterine  fibroids Hx chest pain : exercise stress test negative 06-07 Perimenopausal symptoms - hot flashes, vaginal dryness, irritability, difficulty sleeping since LMP in 2005 Insomnia External hemorrhoids  Review of Systems       see HPI  Physical Exam  General:  NAD Ears:  bilateral TMs intact.  no tenderness to palpation. no tenderness to insertion of speculum of otoscope. no erythma of ear canals Nose:  no sinus tenderness, no nasal drainage. Mouth:  pharynx pink and moist.   Neck:  supple, full ROM, and no masses.   Lungs:  normal respiratory effort, normal breath sounds, no crackles, and no wheezes.   Heart:  normal rate, regular rhythm, and no murmur.   Abdomen:  soft, non-tender, and normal bowel sounds.   Rectal:  abscess completely gone - no signs of skin opening or infection, no peri-anal tenderness.  external hemorrhoids observed and unchanged. Pulses:  2+ bilateral pedal pulses Extremities:  no edema Psych:  Oriented X3, memory intact for recent and remote, normally interactive, good eye contact, not anxious appearing, and not depressed appearing.     Impression & Recommendations:  Problem # 1:  HOT FLASHES (ICD-627.2) Assessment Unchanged Patient has failed trial of gabapentin and clonidine.  Given that she still has her uterus, is a smoker, and is diabetic, would prefer to not use estrogen replacment therapy.  Will try SSRI paroxetine 20mg  daily.  Problem # 2:  EAR PAIN, RIGHT (ICD-388.70) Assessment: Improved No signs of infection on physical exam and pain was short-lived. Reassured patient that she did not have any signs of infection and no further treatment is necessary at this time.   The following medications were removed from the medication list:    Sulfamethoxazole-tmp Ds 800-160 Mg Tabs (Sulfamethoxazole-trimethoprim) .Marland Kitchen... Take one tablet by mouth twice a day for 7 days Her updated medication list for this problem includes:    Cortisporin 3.5-10000-1 Soln  (Neomycin-polymyxin-hc) .Marland KitchenMarland KitchenMarland KitchenMarland Kitchen 4 drops three times a day into affected ear.  Problem # 3:  HYPERTENSION (ICD-401.9) Assessment: Comment Only BP elevated today, likely 2/2 fact that patient had taken med approximately 15 minutes before I saw her - did not change regimen.  Continue hyzaar. The following medications were removed from the medication list:    Catapres 0.1 Mg Tabs (Clonidine hcl) .Marland Kitchen... Take 2 tablets by mouth at bedtime Her updated medication list for this problem includes:    Hyzaar 100-25 Mg Tabs (Losartan potassium-hctz) .Marland Kitchen... Take 1 tablet by mouth once a day  Complete Medication List: 1)  Hyzaar 100-25 Mg Tabs (Losartan potassium-hctz) .... Take 1 tablet by mouth once a day 2)  Aspirin 81 Mg Tbec (Aspirin) .... Take 1 tablet by mouth once a day 3)  Janumet 50-500 Mg Tabs (Sitagliptin-metformin hcl) .... Take 1 tablet by mouth two times a day 4)  Freestyle Lite Test Strp (Glucose blood) .... Use to test blood glucsoe once time a day 5)  Lancets Misc (Lancets) .... Use to test blood glucose one time a day 6)  Freestyle Control Solution Liqd (Blood glucose calibration) .... Use to calibrate meter and assure quality readings. 7)  Prodigy No Coding Blood Gluc Strp (Glucose blood) .... Use to check your sugar 1-2 times a day 8)  Prodigy Blood Glucose Monitor Devi (Blood glucose monitoring suppl) .... Use to check your blood sugar 9)  Cortisporin 3.5-10000-1 Soln (Neomycin-polymyxin-hc) .... 4 drops three times a day into affected ear. 10)  Pravachol 40 Mg Tabs (Pravastatin sodium) .... Take 1 tablet by mouth once a day at bedtime 11)  Tylenol Extra Strength 500 Mg Tabs (Acetaminophen) .... Take 2 tabs by mouth ever 8 hours as needed for pain. 12)  Hydrocortisone 1 % Crea (Hydrocortisone) .... Apply as instructed under your breast 2-3 times daily. 13)  Paroxetine Hcl 20 Mg Tabs (Paroxetine hcl) .... Take 1 tablet by mouth every morning  Other Orders: Capillary Blood Glucose/CBG  ([address])  Patient Instructions: 1)  You have a prescription for a new medicine called paroxetine.  Please take 1 tablet by mouth daily. 2)  Follow-up with Dr. Comer Locket at your next scheduled appointment. 3)  Please take your other medicines as directed. Prescriptions: PAROXETINE HCL 20 MG TABS (PAROXETINE HCL) Take 1  tablet by mouth every morning  #30 x 2   Entered and Authorized by:   Danelle Berry, MD   Signed by:   Danelle Berry, MD on 06/01/2010   Method used:   Electronically to        [facility] [id number] (retail)       [address] [place], [place]  [address]       Ph: [phone]       Fax: [phone]   RxID:   [id number]    Prevention & Chronic Care Immunizations   Influenza vaccine: Not documented   Influenza vaccine deferral: Not indicated  (05/01/2010)    Tetanus booster: 05/18/2009: Td   Td booster deferral: Not indicated  (05/01/2010)    Pneumococcal vaccine: Not documented   Pneumococcal vaccine deferral: Not indicated  (05/01/2010)  Colorectal Screening   Hemoccult: Negative  (12/25/2005)   Hemoccult action/deferral: Not indicated  (05/01/2010)    Colonoscopy: One 4 mm polyp in the proximal ascending colon, resected and retrieved.  Two 3-5 mm polyps in the proximal ascending colon, resected and retrieved. Pathology results pending. Exam done by Dr.Vincent Schooler.  (03/22/2008)   Colonoscopy action/deferral: Not indicated  (05/01/2010)   Colonoscopy due: 03/23/2011  Other Screening   Pap smear: NEGATIVE FOR INTRAEPITHELIAL LESIONS OR MALIGNANCY.  (02/28/2009)   Pap smear action/deferral: Not indicated-other  (05/01/2010)   Pap smear due: 02/29/2012    Mammogram: No specific mammographic evidence of malignancy.  Assessment: BIRADS 1.   (03/04/2008)   Mammogram action/deferral: Refused  (05/01/2010)   Mammogram due: 03/2009   Smoking status: current  (06/01/2010)   Smoking cessation counseling: yes   (06/01/2010)  Diabetes Mellitus   HgbA1C: 6.6  (05/01/2010)   HgbA1C action/deferral: Ordered  (05/01/2010)    Eye exam: No diabetic retinopathy.     (02/08/2009)   Eye exam due: 02/08/2010    Foot exam: yes  (05/01/2010)   Foot exam action/deferral: Do today   High risk foot: No  (06/28/2008)   Foot care education: Done  (05/01/2010)    Urine microalbumin/creatinine ratio: 6.8  (06/28/2008)   Urine microalbumin action/deferral: Deferred   Urine microalbumin/cr due: 06/28/2009  Lipids   Total Cholesterol: 236  (11/17/2009)   Lipid panel action/deferral: Lipid Panel ordered   LDL: 129  (11/17/2009)   LDL Direct: Not documented   HDL: 50  (11/17/2009)   Triglycerides: 284  (11/17/2009)   Lipid panel due: 01/05/2010    SGOT (AST): 17  (11/17/2009)   BMP action: Ordered   SGPT (ALT): 19  (11/17/2009)   Alkaline phosphatase: 70  (11/17/2009)   Total bilirubin: 0.3  (11/17/2009)  Hypertension   Last Blood Pressure: 164 / 95  (06/01/2010)   Serum creatinine: 0.77  (11/17/2009)   BMP action: Ordered   Serum potassium 4.2  (11/17/2009)   Basic metabolic panel due: 01/05/2010  Self-Management Support :   Personal Goals (by the next clinic visit) :     Personal A1C goal: 6  (11/16/2009)     Personal blood pressure goal: 130/80  (05/11/2009)     Personal LDL goal: 70  (11/16/2009)    Patient will work on the following items until the next clinic visit to reach self-care goals:     Medications and monitoring: take my medicines every day, check my blood sugar, examine my feet every day  (06/01/2010)     Eating:  drink diet soda or water instead of juice or soda, eat more vegetables, use fresh or frozen vegetables, eat baked foods instead of fried foods, eat fruit for snacks and desserts, limit or avoid alcohol  (06/01/2010)     Activity: take a 30 minute walk every day, take the stairs instead of the elevator  (06/01/2010)     Other: walk every night  (05/01/2010)     Home  glucose monitoring frequency: 1 time daily  (11/16/2009)    Diabetes self-management support: Written self-care plan, Education handout, Resources for patients handout  (06/01/2010)   Diabetes care plan printed   Diabetes education handout printed   Last diabetes self-management training by diabetes educator: 01/05/2009    Hypertension self-management support: Written self-care plan, Education handout, Resources for patients handout  (06/01/2010)   Hypertension self-care plan printed.   Hypertension education handout printed    Lipid self-management support: Written self-care plan, Education handout, Resources for patients handout  (06/01/2010)   Lipid self-care plan printed.   Lipid education handout printed      Resource handout printed.  Appended Document: itching, vag since abx/pcp-boggala/hla Ms. Jone's history and physical examination were reviewed with Dr. Claudette Laws and the assessment and plan were formulated together.  I agree with the above documentation.

## 2010-11-07 NOTE — Progress Notes (Signed)
Summary: refill/gg  Phone Note Refill Request  on Feb 23, 2010 4:08 PM  Refills Requested: Medication #1:  CATAPRES 0.1 MG TABS Take 2 tablets by mouth at bedtime # 60   Method Requested: Electronic Initial call taken by: Merrie Roof RN,  Feb 23, 2010 4:09 PM  Follow-up for Phone Call       Follow-up by: Blondell Reveal MD,  Feb 23, 2010 4:14 PM    Prescriptions: CATAPRES 0.1 MG TABS (CLONIDINE HCL) Take 2 tablets by mouth at bedtime  #60 x 4   Entered and Authorized by:   Blondell Reveal MD   Signed by:   Blondell Reveal MD on 02/23/2010   Method used:   Electronically to        [facility] [id number] (retail)       [address]       [place], [place]  [address]       Ph: [phone]       Fax: [phone]   RxID:   [id number]

## 2010-11-07 NOTE — Letter (Signed)
Summary: [facility]  [facility]   Imported By: Shon Hough 05/03/2010 13:50:02  _____________________________________________________________________  External Attachment:    Type:   Image     Comment:   External Document

## 2010-11-07 NOTE — Consult Note (Signed)
[facility] Dr. Bosie [facility]   Imported By: Florinda Marker 01/12/2008 14:59:00  _____________________________________________________________________  External Attachment:    Type:   Image     Comment:   External Document  Appended Document: [facility]: Dr. Bosie Clos   Colonoscopy  Procedure date:  01/06/2008  Findings:      Results: Polyp.  2 polyps : one too large to remove completely.  Repeat colonoscopy in 3 months.  Awaiting pathology. Internal hemorrhoids  Comments:      Repeat colonoscopy in 1-3 months.    Procedures Next Due Date:    Colonoscopy: 04/2008  Colonoscopy  Procedure date:  01/06/2008  Findings:      Results: Polyp.  2 polyps : one too large to remove completely.  Repeat colonoscopy in 3 months.  Awaiting pathology. Internal hemorrhoids  Comments:      Repeat colonoscopy in 1-3 months.    Procedures Next Due Date:    Colonoscopy: 04/2008

## 2010-11-07 NOTE — Progress Notes (Signed)
Summary: refill/gh  Phone Note Refill Request Message from:  Fax from Pharmacy on January 06, 2007 11:09 AM  Pt wants refill on Lisinopril.  Not on med list  Initial call taken by: Angelina Ok RN,  January 06, 2007 11:10 AM  Follow-up for Phone Call        Send to PCP Follow-up by: Ulyess Mort MD,  January 06, 2007 4:31 PM  Additional Follow-up for Phone Call Additional follow up Details #1::        She is on Cozaar, not lisinopril.  This has been changed a while ago. Thanks. Additional Follow-up by: Dellia Beckwith MD,  January 08, 2007 2:05 PM

## 2010-11-07 NOTE — Letter (Signed)
Summary: *Referral Letter [facility])  [facility]  [address]   [place], [place] [address]   Phone: [phone]  Fax: [phone]    08/31/2008  TO: Reka Wist Phone: [phone] Cell: [phone]  Dear Doreene Adas,  Thank you in advance for agreeing to see my patient:  Bridget Owens [address] [place], [place]  [address]  Phone: [phone]  Reason for Referral: suspected TB  Procedures Requested: PPD, sputum for AFB x3  I am including a copy of the most recent office note.  Current Medical Problems: 1)  COUGH (ICD-786.2) 2)  ADENOMATOUS COLONIC POLYP (ICD-211.3) 3)  DIABETES MELLITUS, TYPE II (ICD-250.00) 4)  HYPERTENSION (ICD-401.9) 5)      HYPERTENSIVE RETINOPATHY (ICD-362.11) 6)  DYSLIPIDEMIA (ICD-272.4) 7)  LEG PAIN, RIGHT (ICD-729.5) 8)  TOE PAIN (ICD-729.5) 9)  ABDOMINAL PAIN (ICD-789.00) 10)  DYSURIA (ICD-788.1) 11)  PRURITUS (ICD-698.9) 12)  DIABETIC PERIPHERAL NEUROPATHY (ICD-250.60) 13)  EAR PAIN, BILATERAL (ICD-388.70) 14)  HEALTH MAINTENANCE EXAM (ICD-V70.0) 15)  NUMBNESS (ICD-782.0) 16)  PRURITUS, VAGINAL (ICD-698.1) 17)  VAGINITIS, ATROPHIC, SYMPTOMATIC (ICD-627.3) 18)  SMOKER (ICD-305.1) 19)  GERD (ICD-530.81) 20)  ALLERGIC RHINITIS (ICD-477.9) 21)  Hx of LEG CRAMPS (ICD-729.82) 22)  FIBROIDS, UTERUS (ICD-218.9) 23)  PERIMENOPAUSAL STATUS (ICD-627.2) 24)  INSOMNIA (ICD-780.52) 25)  EXTERNAL HEMORRHOIDS (ICD-455.3)   Current Medications: 1)  HYZAAR 100-25 MG TABS (LOSARTAN POTASSIUM-HCTZ) Take 1 tablet by mouth once a day 2)  ASPIRIN 81 MG TBEC (ASPIRIN) Take 1 tablet by mouth once a day 3)  JANUMET 50-500 MG TABS (SITAGLIPTIN-METFORMIN HCL) Take 1 tablet by mouth two times a day 4)  TYLENOL 8 HOUR 650 MG  TBCR (ACETAMINOPHEN) Take 1 tablet by mouth three times a day 5)  PROTONIX 40 MG  PACK (PANTOPRAZOLE SODIUM) Take one tablet by mouth daily 6)  CADUET 5-20 MG TABS (AMLODIPINE-ATORVASTATIN) Take 1 tablet by mouth once a  day 7)  AMITRIPTYLINE HCL 25 MG  TABS (AMITRIPTYLINE HCL) Take 1 tablet by mouth at bedtime 8)  FREESTYLE LITE TEST   STRP (GLUCOSE BLOOD) use to test blood glucsoe once time a day [BMN] 9)  LANCETS   MISC (LANCETS) use to test blood glucose one time a day 10)  FREESTYLE CONTROL SOLUTION   LIQD (BLOOD GLUCOSE CALIBRATION) use to calibrate meter and assure quality readings. [BMN] 11)  CLONIDINE HCL 0.1 MG  TABS (CLONIDINE HCL) Take 1 tablet by mouth at bedtime 12)  LORATADINE 10 MG TABS (LORATADINE) Take 1 tablet by mouth once a day 13)  GUAIFENESIN-CODEINE 200-10 MG/5ML LIQD (GUAIFENESIN-CODEINE) take 5-62ml by mouth up to every 6 hours as needed for cough   Past Medical History: 1)  Diabetes mellitus, type II 2)  Hypertension 3)  Headache 4)  Tobacco use 5)  Hyperlipidemia 6)  Allergic rhinitis 7)  Hx leg cramps on Lipitor 8)  GERD 9)  Uterine fibroids 10)  Hx chest pain : exercise stress test negative 06-07 11)  Perimenopausal 12)  Insomnia 13)  External hemorrhoids 14)  Menopause 15)  Atrophic vaginitis   Family History:    Social History: 1)  Current Smoker   Pertinent Labs:    Please send (mail / fax) all correspondence pertaining to this referral to:  ATTN:    _________________________________   [facility]   [address]   [place], [place] [address]    Fax: [phone]  Thank you again for agreeing to see our patient; please contact us if  you have any further questions or need additional information.  Sincerely,  Loel Dubonnet MD  Appended Document: *Referral Letter [facility]) Referral letter and OV faxed yesterday  to Doreene Adas per Dr. Janyth Pupa' request.

## 2010-11-07 NOTE — Assessment & Plan Note (Signed)
Summary: ACUTE-SWOLLEN HANDS-(BOGGALA)/CFB   Vital Signs:  Patient profile:   55 year old female Height:      65 inches (165.10 cm) Weight:      182.2 pounds (82.82 kg) BMI:     30.43 Temp:     96.9 degrees F (36.06 degrees C) oral Pulse rate:   94 / minute BP sitting:   167 / 104  (left arm) Cuff size:   regular  Vitals Entered By: Chinita Pester RN (August 28, 2010 1:35 PM) CC: Both hands are swollen x 2 months (intermittently). Is Patient Diabetic? Yes Did you bring your meter with you today? No Pain Assessment Patient in pain? yes     Location: hands Intensity: 6 Type: aching Onset of pain  Intermittent Nutritional Status BMI of > 30 = obese CBG Result 122  Have you ever been in a relationship where you felt threatened, hurt or afraid?No   Does patient need assistance? Functional Status Self care Ambulation Normal   Diabetic Foot Exam Last Podiatry Exam Date: 08/28/2010  Comments: Refused foot exam.   Primary Care Provider:  Blondell Reveal MD  CC:  Both hands are swollen x 2 months (intermittently)..  History of Present Illness: Bridget Owens is a 55yo W with well-controlled, DM2, and osteoarthritis who presents today for follow-up of bilateral hand pain and swelling. She reports that both of her hands tend to swell and become painful; however, the 4th and 5th fingers of her R hand are most severely affected (these fingers typically remain so swollen that she flex and extend them fully). She also complains of numbness in the 4th and 5th fingers of her R hand. She is still able to use her hands well enough to wash dishes, but she has trouble peeling onions and potatoes and sometimes even bathing herself. 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 X-ray of her R hand in May 2011 demonstrated only mild first [facility] degenerative disease. She was seen 2 weeks ago by Dr. Threasa Beards who recommended she take  ibuprofen, in addition to the Neurontin she already takes, for the pain in her hands but her symptoms have not improved. She also complains of ongoing bilateral ear pain, R>L.   Depression History:      The patient denies a depressed mood most of the day and a diminished interest in her usual daily activities.         Preventive Screening-Counseling & Management  Alcohol-Tobacco     Alcohol drinks/day: 0     Smoking Status: current     Smoking Cessation Counseling: yes     Packs/Day: 1/2 ppd     Year Started: at the age of 61     Passive Smoke Exposure: yes  Caffeine-Diet-Exercise     Does Patient Exercise: no     Type of exercise: walkiing     Times/week: 2  Current Medications (verified): 1)  Hyzaar 100-25 Mg Tabs (Losartan Potassium-Hctz) .... Take 1 Tablet By Mouth Once A Day 2)  Aspirin 81 Mg Tbec (Aspirin) .... Take 1 Tablet By Mouth Once A Day 3)  Janumet 50-500 Mg Tabs (Sitagliptin-Metformin Hcl) .... Take 1 Tablet By Mouth Two Times A Day 4)  Freestyle Lite Test   Strp (Glucose Blood) .... Use To Test Blood Glucsoe Once Time A Day 5)  Lancets   Misc (Lancets) .... Use To Test Blood Glucose One Time A Day 6)  Freestyle Control Solution  Liqd (Blood Glucose Calibration) .... Use To Calibrate Meter and Assure Quality Readings. 7)  Prodigy No Coding Blood Gluc  Strp (Glucose Blood) .... Use To Check Your Sugar 1-2 Times A Day 8)  Prodigy Blood Glucose Monitor  Devi (Blood Glucose Monitoring Suppl) .... Use To Check Your Blood Sugar 9)  Cortisporin 3.5-10000-1 Soln (Neomycin-Polymyxin-Hc) .... 4 Drops Three Times A Day Into Affected Ear. 10)  Pravachol 40 Mg Tabs (Pravastatin Sodium) .... Take 2 Tablets By Mouth Once A Day At Bedtime 11)  Tylenol Extra Strength 500 Mg Tabs (Acetaminophen) .... Take 2 Tabs By Mouth Ever 8 Hours As Needed For Pain. 12)  Amitriptyline Hcl 50 Mg Tabs (Amitriptyline Hcl) .... Take 1 Tab By Mouth At Bedtime 13)  Neurontin 300 Mg Caps (Gabapentin)  .... Take 1 Tablet By Mouth At Bedtime Day 1, Then Take 1 Tablet By Mouth Two Times A Day On Day 2, Then Three Times A Day 14)  Ibuprofen 400 Mg Tabs (Ibuprofen) .... Take One (1) Tablet By Mouth Three (3) Times A Day With Food As Needed For Pain 15)  Vicodin 5-500 Mg Tabs (Hydrocodone-Acetaminophen) .... Take One Tablet Every Six Hours As Needed For Pain  Allergies: 1)  ! Codeine 2)  ! Vicodin 3)  ! Doxycycline Hyclate (Doxycycline Hyclate) 4)  Penicillin V Potassium (Penicillin V Potassium)  Past History:  Past Medical History: Last updated: 06/01/2010 L sided Bell's palsy 01/2009 Diabetes mellitus, type II Hypertension Headache Tobacco use Hyperlipidemia Allergic rhinitis Hx leg cramps on Lipitor GERD Uterine fibroids Hx chest pain : exercise stress test negative 06-07 Perimenopausal symptoms - hot flashes, vaginal dryness, irritability, difficulty sleeping since LMP in 2005 Insomnia External hemorrhoids  Family History: Last updated: Sep 04, 2008 M: died with bad cough 1969 @ 57yo, not sure about health history S: died with bad cough 1996 @ 57yo, of pneumonia  Social History: Last updated: 11/12/2006 Current Smoker  Review of Systems      See HPI General:  Denies chills, fatigue, fever, malaise, weakness, and weight loss. Eyes:  Denies blurring. ENT:  Denies sore throat. CV:  Complains of swelling of hands; denies chest pain or discomfort. Resp:  Denies shortness of breath. GI:  Denies abdominal pain and change in bowel habits. GU:  Denies dysuria. MS:  Complains of joint pain, loss of strength, and stiffness. Derm:  Denies rash. Neuro:  Complains of numbness; denies disturbances in coordination, tremors, and visual disturbances. Heme:  Denies bleeding.  Physical Exam  General:  alert and cooperative to examination.   Head:  normocephalic and atraumatic.   Eyes:  vision grossly intact, pupils equal, pupils round, and pupils reactive to light.   Ears:  no  external deformities. Mild inflamation of R ear canal. TMs intact bilaterally.  Nose:  no external deformity.   Mouth:  pharynx pink and moist.   Lungs:  normal breath sounds, no crackles, and no wheezes.   Heart:  normal rate, regular rhythm, no murmur, no gallop, and no rub.   Abdomen:  soft, non-tender, and no distention.   Msk:  4th and 5th digits of R hand are visibly swollen and fixed in a slightly flexed position (patient can straighten and bend them using her other hand). There is no tenderness to palpation of either hand but there is decreased sendation in the 4th and 5th digits of the R hand.  Extremities:  No cyanosis or edema.  Neurologic:  alert & oriented X3. Decreased strength and sensation in R hand  as described above.  Skin:  turgor normal and no rashes.   Psych:  Oriented X3, memory intact for recent and remote, normally interactive, good eye contact, not anxious appearing, and not depressed appearing.     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 Given her known cervical spine DJD, it seems likely that the ulnar nerve is impinged at the level of the spine; however, it could also be impinged elsewhere, such as at the level of the elbow. Will order a nerve conduction study to pinpoint the location of impingement. May also consider an MRI of the cervical spine in the future. In the meantime, will prescribe Vicodin to be taken as needed for pain control.   Orders: Nerve Conduction (Nerve Conduction)  Problem # 2:  EAR PAIN, RIGHT (ICD-388.70) Exam demonstrated mild erythema of R ear canal but is not entirely convincing for otitis externa. Will refill antibiotic ear drops.   Her updated medication list for this problem includes:    Cortisporin 3.5-10000-1 Soln (Neomycin-polymyxin-hc) .Marland KitchenMarland KitchenMarland KitchenMarland Kitchen 4 drops three times a  day into affected ear.  Problem # 3:  DIABETES MELLITUS, TYPE II (ICD-250.00) Well controlled. Continue current management.  Her updated medication list for this problem includes:    Hyzaar 100-25 Mg Tabs (Losartan potassium-hctz) .Marland Kitchen... Take 1 tablet by mouth once a day    Aspirin 81 Mg Tbec (Aspirin) .Marland Kitchen... Take 1 tablet by mouth once a day    Janumet 50-500 Mg Tabs (Sitagliptin-metformin hcl) .Marland Kitchen... Take 1 tablet by mouth two times a day  Orders: Capillary Blood Glucose/CBG (04540)  Problem # 4:  HYPERTENSION (ICD-401.9) BP elevated today. Patient reports not taking medication this morning. Will continue to monitor at next visit.   Her updated medication list for this problem includes:    Hyzaar 100-25 Mg Tabs (Losartan potassium-hctz) .Marland Kitchen... Take 1 tablet by mouth once a day  Complete Medication List: 1)  Hyzaar 100-25 Mg Tabs (Losartan potassium-hctz) .... Take 1 tablet by mouth once a day 2)  Aspirin 81 Mg Tbec (Aspirin) .... Take 1 tablet by mouth once a day 3)  Janumet 50-500 Mg Tabs (Sitagliptin-metformin hcl) .... Take 1 tablet by mouth two times a day 4)  Freestyle Lite Test Strp (Glucose blood) .... Use to test blood glucsoe once time a day 5)  Lancets Misc (Lancets) .... Use to test blood glucose one time a day 6)  Freestyle Control Solution Liqd (Blood glucose calibration) .... Use to calibrate meter and assure quality readings. 7)  Prodigy No Coding Blood Gluc Strp (Glucose blood) .... Use to check your sugar 1-2 times a day 8)  Prodigy Blood Glucose Monitor Devi (Blood glucose monitoring suppl) .... Use to check your blood sugar 9)  Cortisporin 3.5-10000-1 Soln (Neomycin-polymyxin-hc) .... 4 drops three times a day into affected ear. 10)  Pravachol 40 Mg Tabs (Pravastatin sodium) .... Take 2 tablets by mouth once a day at bedtime 11)  Tylenol Extra Strength 500 Mg Tabs (Acetaminophen) .... Take 2 tabs by mouth ever 8 hours as needed for pain. 12)  Amitriptyline Hcl 50 Mg  Tabs (Amitriptyline hcl) .... Take 1 tab by mouth at bedtime 13)  Neurontin 300 Mg Caps (Gabapentin) .... Take 1 tablet by mouth at bedtime day 1, then take 1 tablet by mouth two times a day on day 2, then three times  a day 14)  Ibuprofen 400 Mg Tabs (Ibuprofen) .... Take one (1) tablet by mouth three (3) times a day with food as needed for pain 15)  Vicodin 5-500 Mg Tabs (Hydrocodone-acetaminophen) .... Take one tablet every six hours as needed for pain  Patient Instructions: 1)  Please schedule a follow-up appointment in 2 weeks. 2)  We will call you to schedule nerve conduction study.  Prescriptions: CORTISPORIN 3.5-10000-1 SOLN (NEOMYCIN-POLYMYXIN-HC) 4 drops three times a day into affected ear.  #1 x 0   Entered and Authorized by:   Whitney Post MD   Signed by:   Whitney Post MD on 08/28/2010   Method used:   Electronically to        [facility] [id number] (retail)       [address] [place], [place]  [address]       Ph: [phone]       Fax: [phone]   RxID:   [id number] VICODIN 5-500 MG TABS (HYDROCODONE-ACETAMINOPHEN) Take one tablet every six hours as needed for pain  #30 x 0   Entered and Authorized by:   Whitney Post MD   Signed by:   Whitney Post MD on 08/28/2010   Method used:   Print then Give to Patient   RxID:   [id number] VICODIN 5-500 MG TABS (HYDROCODONE-ACETAMINOPHEN) Take one tablet every six hours as needed for pain  #30 x 0   Entered and Authorized by:   Whitney Post MD   Signed by:   Whitney Post MD on 08/28/2010   Method used:   Print then Give to Patient   RxID:   [id number]  Please note: 1st printing of Vicodin script didn't go through properly -- patient only received one script for 30 tablets  Orders Added: 1)  Capillary Blood Glucose/CBG [82948] 2)  Nerve Conduction [Nerve Conduction] 3)  Est. Patient Level IV [10932]    Prevention & Chronic Care Immunizations   Influenza vaccine: Not  documented   Influenza vaccine deferral: Refused  (08/11/2010)    Tetanus booster: 05/18/2009: Td   Td booster deferral: Not indicated  (05/01/2010)    Pneumococcal vaccine: Not documented   Pneumococcal vaccine deferral: Not indicated  (05/01/2010)  Colorectal Screening   Hemoccult: Negative  (12/25/2005)   Hemoccult action/deferral: Not indicated  (05/01/2010)    Colonoscopy: One 4 mm polyp in the proximal ascending colon, resected and retrieved.  Two 3-5 mm polyps in the proximal ascending colon, resected and retrieved. Pathology results pending. Exam done by Dr.Vincent Schooler.  (03/22/2008)   Colonoscopy action/deferral: Not indicated  (05/01/2010)   Colonoscopy due: 03/23/2011  Other Screening   Pap smear: NEGATIVE FOR INTRAEPITHELIAL LESIONS OR MALIGNANCY.  (02/28/2009)   Pap smear action/deferral: Not indicated-other  (05/01/2010)   Pap smear due: 02/29/2012    Mammogram: No specific mammographic evidence of malignancy.  Assessment: BIRADS 1.   (03/04/2008)   Mammogram action/deferral: Refused  (05/01/2010)   Mammogram due: 03/2009   Smoking status: current  (08/28/2010)   Smoking cessation counseling: yes  (08/28/2010)  Diabetes Mellitus   HgbA1C: 6.2  (07/28/2010)   HgbA1C action/deferral: Ordered  (05/01/2010)    Eye exam: No diabetic retinopathy.     (02/08/2009)   Diabetic eye exam action/deferral: Ophthalmology referral  (08/11/2010)   Eye exam due: 02/08/2010    Foot exam: yes  (05/01/2010)   Foot exam action/deferral: Do today  High risk foot: Yes  (08/11/2010)   Foot care education: Done  (05/01/2010)    Urine microalbumin/creatinine ratio: 6.8  (06/28/2008)   Urine microalbumin action/deferral: Deferred   Urine microalbumin/cr due: 06/28/2009    Diabetes flowsheet reviewed?: Yes   Progress toward A1C goal: At goal  Lipids   Total Cholesterol: 236  (11/17/2009)   Lipid panel action/deferral: Lipid Panel ordered   LDL: 129  (11/17/2009)    LDL Direct: Not documented   HDL: 50  (11/17/2009)   Triglycerides: 284  (11/17/2009)   Lipid panel due: 01/05/2010    SGOT (AST): 17  (11/17/2009)   BMP action: Ordered   SGPT (ALT): 19  (11/17/2009)   Alkaline phosphatase: 70  (11/17/2009)   Total bilirubin: 0.3  (11/17/2009)    Lipid flowsheet reviewed?: Yes   Progress toward LDL goal: Unchanged  Hypertension   Last Blood Pressure: 167 / 104  (08/28/2010)   Serum creatinine: 0.77  (11/17/2009)   BMP action: Ordered   Serum potassium 4.2  (11/17/2009)   Basic metabolic panel due: 01/05/2010    Hypertension flowsheet reviewed?: Yes   Progress toward BP goal: Deteriorated  Self-Management Support :   Personal Goals (by the next clinic visit) :     Personal A1C goal: 6  (11/16/2009)     Personal blood pressure goal: 130/80  (05/11/2009)     Personal LDL goal: 70  (11/16/2009)    Patient will work on the following items until the next clinic visit to reach self-care goals:     Medications and monitoring: take my medicines every day, bring all of my medications to every visit, examine my feet every day  (08/28/2010)     Eating: drink diet soda or water instead of juice or soda, eat more vegetables, use fresh or frozen vegetables, eat foods that are low in salt, eat baked foods instead of fried foods, eat fruit for snacks and desserts  (08/28/2010)     Activity: take a 30 minute walk every day  (08/28/2010)     Other: walk every night  (05/01/2010)     Home glucose monitoring frequency: 1 time daily  (11/16/2009)    Diabetes self-management support: Written self-care plan  (08/28/2010)   Diabetes care plan printed   Last diabetes self-management training by diabetes educator: 01/05/2009    Hypertension self-management support: Written self-care plan  (08/28/2010)   Hypertension self-care plan printed.    Lipid self-management support: Written self-care plan  (08/28/2010)   Lipid self-care plan printed.   Nursing  Instructions:     Appended Document: ACUTE-SWOLLEN HANDS-(BOGGALA)/CFB I saw, examined and discussed Ms Murton and assisted Dr Odis Luster in formulating a plan to work up and treat her hand pain.

## 2010-11-07 NOTE — Assessment & Plan Note (Signed)
Summary: NP,TRIGGER FINGER PAIN,MC   Vital Signs:  Patient profile:   55 year old female BP sitting:   136 / 89  Vitals Entered By: Lillia Pauls CMA (March 10, 2010 9:56 AM)  Primary Care Provider:  Blondell Reveal MD   History of Present Illness: several months of worsening right thumb pain, now 8/10 with any movement. Painful at rest toi 5/10. Also has noticed some "triggering" sensation over last 1 mointh when she tries to flex it. Has never gotten locked into flexion   PERTINENT PMH/PSH: No specific injruy to her thiumb. No prior thumb surgery.  Allergies: 1)  ! Codeine 2)  ! Vicodin 3)  ! Doxycycline Hyclate (Doxycycline Hyclate) 4)  Penicillin V Potassium (Penicillin V Potassium)  Review of Systems       denies numbness or burninhg in her thumb or hand  Physical Exam  Msk:  right thumb tender at CMP. No redness or warmth. No joint effusion. ROm is limited by pain. Slight triggering ius noted on extension but she has full extension. 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  Patient given informed consent for injection. Discussed possible complications of infection, bleeding or skin atrophy at site of injection. Possible side effect of avascular necrosis (focal area of bone death) due to steroid use.Appropriate verbal time out taken Are cleaned and prepped in usual sterile fashion. A -1/2--- cc kennalog plus --1/2--cc 1% lidocaine without epinephrine was injected into the---right CMP joint. Patient tolerated procedure well with no complications.    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 Take 1 tablet by mouth once a day    Tylenol Extra Strength 500 Mg Tabs (Acetaminophen) .Marland Kitchen... Take 2 tabs by mouth ever 8 hours as needed for pain. we  tried injection therapy today. Will have her use ES tylenol, 2 tabs of 500 mg three times a day as needed. Should the triggering sensation continue to get worse she will return  Orders: Joint Aspirate / Injection, Small (16109) Kenalog 10 mg inj (J3301)  Complete Medication List: 1)  Hyzaar 100-25 Mg Tabs (Losartan potassium-hctz) .... Take 1 tablet by mouth once a day 2)  Aspirin 81 Mg Tbec (Aspirin) .... Take 1 tablet by mouth once a day 3)  Janumet 50-500 Mg Tabs (Sitagliptin-metformin hcl) .... Take 1 tablet by mouth two times a day 4)  Freestyle Lite Test Strp (Glucose blood) .... Use to test blood glucsoe once time a day 5)  Lancets Misc (Lancets) .... Use to test blood glucose one time a day 6)  Freestyle Control Solution Liqd (Blood glucose calibration) .... Use to calibrate meter and assure quality readings. 7)  Catapres 0.1 Mg Tabs (Clonidine hcl) .... Take 2 tablets by mouth at bedtime 8)  Prodigy No Coding Blood Gluc Strp (Glucose blood) .... Use to check your sugar 1-2 times a day 9)  Prodigy Blood Glucose Monitor Devi (Blood glucose monitoring suppl) .... Use to check your blood sugar 10)  Premarin 0.625 Mg/gm Crea (Estrogens, conjugated) .... Apply once daily for 2 weeks. twice weekly thereafter 11)  Claritin 10 Mg Tabs (Loratadine) .... Take 1 tablet by mouth once a day 12)  Cortisporin 3.5-10000-1 Soln (Neomycin-polymyxin-hc) .... 4 drops three times a day into affected ear. 13)  Pravachol 40 Mg Tabs (Pravastatin  sodium) .... Take 1 tablet by mouth once a day at bedtime 14)  Fluticasone Propionate 50 Mcg/act Susp (Fluticasone propionate) .... 2 spray inside each nostril daily. 15)  Nexium 40 Mg Cpdr (Esomeprazole magnesium) .... Take 1 tablet by mouth once a day 16)  Pepcid 20 Mg Tabs (Famotidine) .... Take 1 tablet by mouth two times a day 17)  Diphenhydramine Hcl 25 Mg Caps (Diphenhydramine hcl) .... Take 2 tabs by mouth every 6 hours. 18)  Tylenol Extra Strength 500 Mg  Tabs (Acetaminophen) .... Take 2 tabs by mouth ever 8 hours as needed for pain. 19)  Hydrocortisone 1 % Crea (Hydrocortisone) .... Apply as instructed under your breast 2-3 times daily.

## 2010-11-07 NOTE — Assessment & Plan Note (Signed)
Summary: ACUTE-VAGINAL ITCHING AND BURNING(BOGGALA)/CFB   Vital Signs:  Patient Profile:   55 Years Old Female Height:     67 inches (170.18 cm) Weight:      183.6 pounds (83.45 kg) BMI:     28.86 Temp:     96.8 degrees F (36 degrees C) oral Pulse rate:   76 / minute BP sitting:   123 / 75  (right arm)  Pt. in pain?   yes    Location:   left foot and vag    Intensity:   8    Type:       itching and burning  Vitals Entered By: Stanton Kidney Ditzler RN (October 27, 2008 10:49 AM)              Is Patient Diabetic? Yes Did you bring your meter with you today? No Nutritional Status BMI of 25 - 29 = overweight Nutritional Status Detail appetite good CBG Result 167  Have you ever been in a relationship where you felt threatened, hurt or afraid?denies   Does patient need assistance? Functional Status Self care Ambulation Normal     PCP:  Blondell Reveal MD  Chief Complaint:  Past 4 days - itching and burning vag area and left foot. Denies vag discharge.Marland Kitchen  History of Present Illness: Pt is a 55 yo female w/ past medical history below here for evaluation of vaginal itching and burning and burning pain/numbness in her L leg.  Vaginal symptoms started about 4 days ago.  She denies dysuria and increased frequency.  She has had a mild vaginal discharge and is unsure color.  She is sexually active but no new partners.  She has not tried any medications for the vaginal itching.  She took the 1 time dose of diflucan two weeks ago and said that helped but then said it didn't.   Her L leg started bothering her about the same time.  Pain is desribed as tingling and numbness.  She has not had this tingling before.      Updated Prior Medication List: Pt is unsure of her medications and did not bring them with her.  Current Allergies (reviewed today): ! CODEINE ! VICODIN ! DOXYCYCLINE HYCLATE (DOXYCYCLINE HYCLATE)  Past Medical History:    Reviewed history from 04/22/2007 and no changes  required:       Diabetes mellitus, type II       Hypertension       Headache       Tobacco use       Hyperlipidemia       Allergic rhinitis       Hx leg cramps on Lipitor       GERD       Uterine fibroids       Hx chest pain : exercise stress test negative 06-07       Perimenopausal       Insomnia       External hemorrhoids       Menopause       Atrophic vaginitis   Social History:    Reviewed history from 11/12/2006 and no changes required:       Current Smoker   Risk Factors:  Tobacco use:  current    Year started:  at the age of 23    Cigarettes:  Yes -- 1/2 pack(s) per day Passive smoke exposure:  yes Alcohol use:  no Exercise:  no Seatbelt use:  100 %  Colonoscopy History:  Date of Last Colonoscopy:  03/22/2008  Mammogram History:    Date of Last Mammogram:  03/04/2008  PAP Smear History:    Date of Last PAP Smear:  02/24/2008   Review of Systems       As per HPI.   Physical Exam  General:     Alert, no distress.  Eyes:     anicteric. Lungs:     normal respiratory effort, normal breath sounds, no crackles, and no wheezes.   Heart:     normal rate and regular rhythm, soft SEM I/VI at RSB, no rubs/gallops.   Abdomen:     +BS's, soft, NT and ND. Rectal:     External hemorrhoid noted, noninflamed appearing. Genitalia:     clitoral hyperthrophy noted, moderate amount of white vaginal discharge noted, no cervical motion tenderness, cervix posterior, mucosa pink, no foul smelling, introitus mildly erythematous.   Extremities:     no pitting edema, no rash, 2+ DP pulse, decreased sensation to monofilament testing on L foot noted.  Neurologic:     alert & oriented X3 and gait normal.      Impression & Recommendations:  Problem # 1:  VAGINAL PRURITUS (ICD-698.1) Concerning for yeast infection, given hx of diabetes and previous yeast infections, so will tx empirically w/ clotrimazole ointment.  However, given last time she had symptoms the wet  prep was neg for candida, will repeat wet prep today and also check GC/chl.   Did not do pap b/c pt had pap smear in 05/09.  Orders: T-Wet Prep by Molecular [facility]be [phone]) T- GC Chlamydia (93716)   Problem # 2:  NUMBNESS (ICD-782.0) Likely secondary to diabetic neuropathy. Considered shingles but symptoms have been present x 4-5 days and she has no rash, so doubtful.  Would increase amitriptyline dose but I'm(and the pt) is not sure if she is taking it.  However, will check HIV, RPR and B12 to see if one of these could be contributing.   Orders: T-HIV Antibody  (Reflex) [id number]) T-Syphilis Test (RPR) [phone]) T-Vitamin B12 ([id number])   Problem # 3:  DIABETES MELLITUS, TYPE II (ICD-250.00) Up to date on a1c-at goal.  BP at goal.  Lipids up to date, may repeat at next visit.  Up to date on foot exam and urine microalb/cr ratio.  Plans to make appt for optho eye exam this week.  Her updated medication list for this problem includes:    Hyzaar 100-25 Mg Tabs (Losartan potassium-hctz) .Marland Kitchen... Take 1 tablet by mouth once a day    Aspirin 81 Mg Tbec (Aspirin) .Marland Kitchen... Take 1 tablet by mouth once a day    Janumet 50-500 Mg Tabs (Sitagliptin-metformin hcl) .Marland Kitchen... Take 1 tablet by mouth two times a day  Orders: Capillary Blood Glucose (44315) Fingerstick (40086)  Labs Reviewed: HgBA1c: 6.9 (09/21/2008)   Creat: 0.75 (08/31/2008)   Microalbumin: 0.43 (06/28/2008)  Last Eye Exam: No diabetic retinopathy.    (04/15/2008)   Problem # 4:  Preventive Health Care (ICD-V70.0) Mammogram 05/09, pap 05/09, and colonoscopy on 06/09.  Complete Medication List: 1)  Hyzaar 100-25 Mg Tabs (Losartan potassium-hctz) .... Take 1 tablet by mouth once a day 2)  Aspirin 81 Mg Tbec (Aspirin) .... Take 1 tablet by mouth once a day 3)  Janumet 50-500 Mg Tabs (Sitagliptin-metformin hcl) .... Take 1 tablet by mouth two times a day 4)  Tylenol 8 Hour 650 Mg Tbcr (Acetaminophen) .... Take 1 tablet by  mouth three times a day 5)  Protonix  40 Mg Pack (Pantoprazole sodium) .... Take one tablet by mouth daily 6)  Caduet 5-20 Mg Tabs (Amlodipine-atorvastatin) .... Take 1 tablet by mouth once a day 7)  Amitriptyline Hcl 25 Mg Tabs (Amitriptyline hcl) .... Take 1 tablet by mouth at bedtime 8)  Freestyle Lite Test Strp (Glucose blood) .... Use to test blood glucsoe once time a day 9)  Lancets Misc (Lancets) .... Use to test blood glucose one time a day 10)  Freestyle Control Solution Liqd (Blood glucose calibration) .... Use to calibrate meter and assure quality readings. 11)  Catapres 0.2 Mg Tabs (Clonidine hcl) .... Take 1 pill once daily at bed time. 12)  Loratadine 10 Mg Tabs (Loratadine) .... Take 1 tablet by mouth once a day 13)  Protonix 40 Mg Pack (Pantoprazole sodium) .... Take 1 tablet by mouth two times a day 14)  Clotrimazole 3 Day 2 % Crea (Clotrimazole) .... Apply cream to vaginal area once a day for three days.   Patient Instructions: 1)  Please make a followup appointment in two months. 2)  Please call sooner if you do not get better. 3)  You will be called with any abnormal labwork.  Please make sure your phone number is correct at the front desk. 4)  Please use the ointment once a day for three days. 5)  Please remember to bring your pill bottles with you to all of your visits.  6)  Please get your eye exam done.    Prescriptions: CLOTRIMAZOLE 3 DAY 2 % CREA (CLOTRIMAZOLE) Apply cream to vaginal area once a day for three days.  #1 tube x 3   Entered and Authorized by:   Joaquin Courts  MD   Signed by:   Joaquin Courts  MD on 10/27/2008   Method used:   Electronically to        [facility] [id number] (retail)       [address] [place], [place]  [address]       Ph: [phone]       Fax: [phone]   RxID:   [id number]

## 2010-11-07 NOTE — Progress Notes (Signed)
Summary: Refill/gh  Phone Note Refill Request Message from:  Patient on August 30, 2009 3:01 PM  Refills Requested: Medication #1:  CATAPRES 0.1 MG TABS Take 2 tablets by mouth at bedtime Pt is taking 2 pills per day and needs 60 per month instead of 30 almost out of pills.   Method Requested: Electronic Initial call taken by: Angelina Ok RN,  August 30, 2009 3:02 PM  Follow-up for Phone Call        Refilled electronically.  Follow-up by: Margarito Liner MD,  August 31, 2009 5:48 PM    Prescriptions: CATAPRES 0.1 MG TABS (CLONIDINE HCL) Take 2 tablets by mouth at bedtime  #60 x 4   Entered and Authorized by:   Margarito Liner MD   Signed by:   Margarito Liner MD on 08/31/2009   Method used:   Electronically to        [facility] [id number] (retail)       [address] [place], [place]  [address]       Ph: [phone]       Fax: [phone]   RxID:   [id number]

## 2010-11-07 NOTE — Assessment & Plan Note (Signed)
Summary: BOIL ON RECTUM/BOGGALA/DS   Vital Signs:  Patient profile:   55 year old female Height:      65 inches (165.10 cm) Weight:      183.2 pounds (83.27 kg) BMI:     30.60 Temp:     96.8 degrees F (36 degrees C) oral Pulse rate:   73 / minute BP sitting:   171 / 100  (right arm)  Vitals Entered By: Stanton Kidney Ditzler RN (May 17, 2010 10:16 AM) CC: "boil" on bottom Is Patient Diabetic? Yes Did you bring your meter with you today? No Pain Assessment Patient in pain? yes     Location: above rectum Intensity: 8 Type: burning Onset of pain  since 05/11/10 Nutritional Status BMI of > 30 = obese Nutritional Status Detail appetite good CBG Result 119  Have you ever been in a relationship where you felt threatened, hurt or afraid?denies   Does patient need assistance? Functional Status Self care Ambulation Normal Comments Ck above rectum - sat on wet sofa 05/11/10 - burning on skin.   Primary Care Provider:  Blondell Reveal MD  CC:  "boil" on bottom.  History of Present Illness: Patient comes in today with complaint of a painful, itchy "boil" in her gluteal cleft that arose 6 days ago.  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  Last boil that she had was several years ago (since before she was 48). NO history of MRSA. No fevers, change in bowel movements.  First time she has had one on her bottom.  She has tried sitting in hot water but this has not helped.  She has not tried anything else for it.  No much pain but some itching  Has taken antibiotics for this problem in the past.  Says she got a yeast infection after taking penicillin.  doxycycline gave her hives.  "G" antibiotic ?gentamycin?  - told she couldn't take it for some reason.  patient does not think she has ever taken any sulfa drugs or has had a reaction to bactrim in the past.  Patient does have external hemorrhoids. Per patient, sometimes they bleed and  she will use preparation H for this with good relief.  Patient did not take any of her meds today, says she was rushed so she will take them when she gets home.  No other complaints.  no urinary symptoms, CP, SOB.    Depression History:      The patient denies a depressed mood most of the day and a diminished interest in her usual daily activities.         Preventive Screening-Counseling & Management  Alcohol-Tobacco     Alcohol drinks/day: 0     Smoking Status: current     Smoking Cessation Counseling: yes     Packs/Day: 1/2 ppd     Year Started: at the age of 11     Passive Smoke Exposure: yes  Caffeine-Diet-Exercise     Does Patient Exercise: no     Type of exercise: walkiing     Times/week: 2  Current Medications (verified): 1)  Hyzaar 100-25 Mg Tabs (Losartan Potassium-Hctz) .... Take 1 Tablet By Mouth Once A Day 2)  Aspirin 81 Mg Tbec (Aspirin) .... Take 1 Tablet By Mouth Once A Day 3)  Janumet 50-500 Mg Tabs (Sitagliptin-Metformin Hcl) .... Take 1 Tablet By Mouth Two Times A Day 4)  Freestyle Lite Test   Strp (Glucose Blood) .Marland KitchenMarland KitchenMarland Kitchen  Use To Test Blood Glucsoe Once Time A Day 5)  Lancets   Misc (Lancets) .... Use To Test Blood Glucose One Time A Day 6)  Freestyle Control Solution   Liqd (Blood Glucose Calibration) .... Use To Calibrate Meter and Assure Quality Readings. 7)  Catapres 0.1 Mg Tabs (Clonidine Hcl) .... Take 2 Tablets By Mouth At Bedtime 8)  Prodigy No Coding Blood Gluc  Strp (Glucose Blood) .... Use To Check Your Sugar 1-2 Times A Day 9)  Prodigy Blood Glucose Monitor  Devi (Blood Glucose Monitoring Suppl) .... Use To Check Your Blood Sugar 10)  Cortisporin 3.5-10000-1 Soln (Neomycin-Polymyxin-Hc) .... 4 Drops Three Times A Day Into Affected Ear. 11)  Pravachol 40 Mg Tabs (Pravastatin Sodium) .... Take 1 Tablet By Mouth Once A Day At Bedtime 12)  Pepcid 20 Mg Tabs (Famotidine) .... Take 1 Tablet By Mouth Two Times A Day 13)  Tylenol Extra Strength 500 Mg Tabs  (Acetaminophen) .... Take 2 Tabs By Mouth Ever 8 Hours As Needed For Pain. 14)  Hydrocortisone 1 % Crea (Hydrocortisone) .... Apply As Instructed Under Your Breast 2-3 Times Daily. 15)  Zolpidem Tartrate 10 Mg Tabs (Zolpidem Tartrate) .... Take 1 Tablet By Mouth Once A Day 16)  Sulfamethoxazole-Tmp Ds 800-160 Mg Tabs (Sulfamethoxazole-Trimethoprim) .... Take One Tablet By Mouth Twice A Day For 7 Days  Allergies: 1)  ! Codeine 2)  ! Vicodin 3)  ! Doxycycline Hyclate (Doxycycline Hyclate) 4)  Penicillin V Potassium (Penicillin V Potassium)  Family History: Reviewed history from 08/31/2008 and no changes required. M: died with bad cough 1969 @ 57yo, not sure about health history S: died with bad cough 1996 @ 57yo, of pneumonia  Social History: Reviewed history from 11/12/2006 and no changes required. Current Smoker  Review of Systems       see HPI  Physical Exam  General:  NAD Head:  atraumatic.   Lungs:  normal respiratory effort, normal breath sounds, no crackles, and no wheezes.   Heart:  normal rate, regular rhythm, and no murmur.   Abdomen:  soft, non-tender, and normal bowel sounds.   Rectal:  External, nontender-nonbleding hemorrhoids observed.  1cm x 2cm tender area with surrounding erythema palpated at 2 o'clock to the anus.  No opening or drainage, no fluctuance.  There is some induration, but well-organized abcess-like area not appreciated.  DRE not performed Extremities:  no edema bilaterally Neurologic:  alert & oriented X3 and gait normal.   Psych:  Oriented X3, normally interactive, good eye contact, not anxious appearing, and not depressed appearing.     Impression & Recommendations:  Problem # 1:  ABSCESS, PERIRECTAL (ICD-566) Patient has early perirectal boil/abscess, but not yet to stage where I believe that I+D would help.  Instead, provided patient with bactrim DS by mouth two times a day x7d with instructions to continue using sitz baths as tolerated.  The  patient does have history of yeast infections after antibiotic use - if she calls in the next week or so with complaints of a yeast infection, she can be provided with a prescription for diflucan without necessarily needing to be seen in the clinic.    She is to follow-up at her regular appointment with Dr. Comer Locket if she has no other problems.   Problem # 2:  HYPERTENSION (ICD-401.9) Patient did not take her bp meds today.  BP of 171/100 in clinic.  She has her medicines at home and will take them when she gets back there  today.  Her updated medication list for this problem includes:    Hyzaar 100-25 Mg Tabs (Losartan potassium-hctz) .Marland Kitchen... Take 1 tablet by mouth once a day    Catapres 0.1 Mg Tabs (Clonidine hcl) .Marland Kitchen... Take 2 tablets by mouth at bedtime  Complete Medication List: 1)  Hyzaar 100-25 Mg Tabs (Losartan potassium-hctz) .... Take 1 tablet by mouth once a day 2)  Aspirin 81 Mg Tbec (Aspirin) .... Take 1 tablet by mouth once a day 3)  Janumet 50-500 Mg Tabs (Sitagliptin-metformin hcl) .... Take 1 tablet by mouth two times a day 4)  Freestyle Lite Test Strp (Glucose blood) .... Use to test blood glucsoe once time a day 5)  Lancets Misc (Lancets) .... Use to test blood glucose one time a day 6)  Freestyle Control Solution Liqd (Blood glucose calibration) .... Use to calibrate meter and assure quality readings. 7)  Catapres 0.1 Mg Tabs (Clonidine hcl) .... Take 2 tablets by mouth at bedtime 8)  Prodigy No Coding Blood Gluc Strp (Glucose blood) .... Use to check your sugar 1-2 times a day 9)  Prodigy Blood Glucose Monitor Devi (Blood glucose monitoring suppl) .... Use to check your blood sugar 10)  Cortisporin 3.5-10000-1 Soln (Neomycin-polymyxin-hc) .... 4 drops three times a day into affected ear. 11)  Pravachol 40 Mg Tabs (Pravastatin sodium) .... Take 1 tablet by mouth once a day at bedtime 12)  Pepcid 20 Mg Tabs (Famotidine) .... Take 1 tablet by mouth two times a day 13)  Tylenol  Extra Strength 500 Mg Tabs (Acetaminophen) .... Take 2 tabs by mouth ever 8 hours as needed for pain. 14)  Hydrocortisone 1 % Crea (Hydrocortisone) .... Apply as instructed under your breast 2-3 times daily. 15)  Zolpidem Tartrate 10 Mg Tabs (Zolpidem tartrate) .... Take 1 tablet by mouth once a day 16)  Sulfamethoxazole-tmp Ds 800-160 Mg Tabs (Sulfamethoxazole-trimethoprim) .... Take one tablet by mouth twice a day for 7 days  Other Orders: Capillary Blood Glucose/CBG ([address])  Patient Instructions: 1)  Please take your antibiotic twice a day for 7 days.  If your infection does not improve after that time, please make an appointment to return to the clinic.   2)  Keep up the sitz baths as you feel comfortable. 3)  Keep you regular appointment with Dr. Comer Locket. Prescriptions: SULFAMETHOXAZOLE-TMP DS 800-160 MG TABS (SULFAMETHOXAZOLE-TRIMETHOPRIM) Take one tablet by mouth twice a day for 7 days  #14 x 0   Entered and Authorized by:   Danelle Berry, MD   Signed by:   Danelle Berry, MD on 05/17/2010   Method used:   Electronically to        [facility] [id number] (retail)       [address] [place], [place]  [address]       Ph: [phone]       Fax: [phone]   RxID:   [id number]    Prevention & Chronic Care Immunizations   Influenza vaccine: Not documented   Influenza vaccine deferral: Not indicated  (05/01/2010)    Tetanus booster: 05/18/2009: Td   Td booster deferral: Not indicated  (05/01/2010)    Pneumococcal vaccine: Not documented   Pneumococcal vaccine deferral: Not indicated  (05/01/2010)  Colorectal Screening   Hemoccult: Negative  (12/25/2005)   Hemoccult action/deferral: Not indicated  (05/01/2010)    Colonoscopy: One 4 mm polyp in the proximal ascending colon, resected  and retrieved.  Two 3-5 mm polyps in the proximal ascending colon, resected and retrieved. Pathology results pending. Exam done by Dr.Vincent Schooler.   (03/22/2008)   Colonoscopy action/deferral: Not indicated  (05/01/2010)   Colonoscopy due: 03/23/2011  Other Screening   Pap smear: NEGATIVE FOR INTRAEPITHELIAL LESIONS OR MALIGNANCY.  (02/28/2009)   Pap smear action/deferral: Not indicated-other  (05/01/2010)   Pap smear due: 02/29/2012    Mammogram: No specific mammographic evidence of malignancy.  Assessment: BIRADS 1.   (03/04/2008)   Mammogram action/deferral: Refused  (05/01/2010)   Mammogram due: 03/2009   Smoking status: current  (05/17/2010)   Smoking cessation counseling: yes  (05/17/2010)  Diabetes Mellitus   HgbA1C: 6.6  (05/01/2010)   HgbA1C action/deferral: Ordered  (05/01/2010)    Eye exam: No diabetic retinopathy.     (02/08/2009)   Eye exam due: 02/08/2010    Foot exam: yes  (05/01/2010)   Foot exam action/deferral: Do today   High risk foot: No  (06/28/2008)   Foot care education: Done  (05/01/2010)    Urine microalbumin/creatinine ratio: 6.8  (06/28/2008)   Urine microalbumin action/deferral: Deferred   Urine microalbumin/cr due: 06/28/2009  Lipids   Total Cholesterol: 236  (11/17/2009)   Lipid panel action/deferral: Lipid Panel ordered   LDL: 129  (11/17/2009)   LDL Direct: Not documented   HDL: 50  (11/17/2009)   Triglycerides: 284  (11/17/2009)   Lipid panel due: 01/05/2010    SGOT (AST): 17  (11/17/2009)   BMP action: Ordered   SGPT (ALT): 19  (11/17/2009)   Alkaline phosphatase: 70  (11/17/2009)   Total bilirubin: 0.3  (11/17/2009)  Hypertension   Last Blood Pressure: 171 / 100  (05/17/2010)   Serum creatinine: 0.77  (11/17/2009)   BMP action: Ordered   Serum potassium 4.2  (11/17/2009)   Basic metabolic panel due: 01/05/2010  Self-Management Support :   Personal Goals (by the next clinic visit) :     Personal A1C goal: 6  (11/16/2009)     Personal blood pressure goal: 130/80  (05/11/2009)     Personal LDL goal: 70  (11/16/2009)    Patient will work on the following items until  the next clinic visit to reach self-care goals:     Medications and monitoring: take my medicines every day, examine my feet every day  (05/17/2010)     Eating: drink diet soda or water instead of juice or soda, eat more vegetables, use fresh or frozen vegetables, eat fruit for snacks and desserts, limit or avoid alcohol  (05/17/2010)     Activity: take a 30 minute walk every day, park at the far end of the parking lot  (05/17/2010)     Other: walk every night  (05/01/2010)     Home glucose monitoring frequency: 1 time daily  (11/16/2009)    Diabetes self-management support: Written self-care plan, Education handout, Resources for patients handout  (05/17/2010)   Diabetes care plan printed   Diabetes education handout printed   Last diabetes self-management training by diabetes educator: 01/05/2009    Hypertension self-management support: Written self-care plan, Education handout, Resources for patients handout  (05/17/2010)   Hypertension self-care plan printed.   Hypertension education handout printed    Lipid self-management support: Written self-care plan, Education handout, Resources for patients handout  (05/17/2010)   Lipid self-care plan printed.   Lipid education handout printed      Resource handout printed.  Appended Document: BOIL ON RECTUM/BOGGALA/DS I saw and examined Ms  Yetta Barre with Dr Claudette Laws and I agree with her note above. The abscess is so tiny and doesn't appear on exam to actually have any fluid inside so no I&D indicated. Agree with sitz baths and ABX and F/U if no better. Dr Rogelia Boga

## 2010-11-07 NOTE — Progress Notes (Signed)
Summary: phone/gg  Phone Note Call from Patient   Caller: Patient Summary of Call: Pt called stating amlodipine is making her nauseated.  she  has taken for 1 month.  She said she will not take this drug any more!!! Can you change to something  Pt [phone] Initial call taken by: Merrie Roof RN,  January 08, 2008 4:44 PM  Follow-up for Phone Call        She needs an appointment please. Follow-up by: Dellia Beckwith MD,  January 13, 2008 11:58 AM  Additional Follow-up for Phone Call Additional follow up Details #1::        Pt informed Additional Follow-up by: Merrie Roof RN,  January 14, 2008 10:24 AM

## 2010-11-07 NOTE — Consult Note (Signed)
Summary: Ophthalmology: Dr. Elmer Picker  Ophthalmology: Dr. Elmer Picker   Imported By: Florinda Marker 11/04/2007 14:54:56  _____________________________________________________________________  External Attachment:    Type:   Image     Comment:   External Document

## 2010-11-07 NOTE — Progress Notes (Signed)
Summary: med refill/gp  Phone Note Refill Request Message from:  Fax from Pharmacy on Feb 06, 2010 11:49 AM  Refills Requested: Medication #1:  PRODIGY NO CODING BLOOD GLUC  STRP use to check your sugar 1-2 times a day   Last Refilled: 03/28/2009  Method Requested: Electronic Initial call taken by: Chinita Pester RN,  Feb 06, 2010 11:49 AM  Follow-up for Phone Call       Follow-up by: Blondell Reveal MD,  Feb 06, 2010 12:21 PM    Prescriptions: PRODIGY NO CODING BLOOD GLUC  STRP (GLUCOSE BLOOD) use to check your sugar 1-2 times a day  #60 x 11   Entered and Authorized by:   Blondell Reveal MD   Signed by:   Blondell Reveal MD on 02/06/2010   Method used:   Electronically to        [facility] [id number] (retail)       [address]       [place], [place]  [address]       Ph: [phone]       Fax: [phone]   RxID:   [id number]

## 2010-11-07 NOTE — Assessment & Plan Note (Signed)
Summary: FU/SB.   Vital Signs:  Patient profile:   56 year old female Height:      65 inches (165.10 cm) Weight:      183.7 pounds (83.50 kg) BMI:     30.68 Temp:     96.7 degrees F (35.94 degrees C) oral Pulse rate:   73 / minute BP sitting:   139 / 90  (left arm)  Vitals Entered By: Stanton Kidney Ditzler RN (March 27, 2010 1:50 PM) Is Patient Diabetic? Yes Did you bring your meter with you today? No Pain Assessment Patient in pain? no      Nutritional Status BMI of > 30 = obese Nutritional Status Detail appetite good  Have you ever been in a relationship where you felt threatened, hurt or afraid?denies   Does patient need assistance? Functional Status Self care Ambulation Normal Comments Itching cont. Cont with hot flashes since age 76. "Boil" in vag area.   Primary Care Provider:  Blondell Reveal MD   History of Present Illness: Bridget Owens comes for a f/u visit.   1. Itching: It is itching on her both legs and her back. It comes every time this time of the year. She applied  hydrocortisone cream and it doesnot help. Benadryl also doesnot help for itching.   2. Hot flashes: It started when she was 55 yrs old. It makes her sick and irritated. She also has sweating.   3. Tobacco abuse: SHe smokes half a pack per day. She has not changed her clothes, detergent, soap or sampoo, bed sheet.   4. R. Thumb pain: It has not improved. She saw Dr. Jennette Kettle.   Depression History:      The patient denies a depressed mood most of the day and a diminished interest in her usual daily activities.         Preventive Screening-Counseling & Management  Alcohol-Tobacco     Alcohol drinks/day: 0     Smoking Status: current     Smoking Cessation Counseling: yes     Packs/Day: 1/2 ppd     Year Started: at the age of 35     Passive Smoke Exposure: yes  Caffeine-Diet-Exercise     Does Patient Exercise: no     Type of exercise: walkiing     Times/week: 2  Current Medications (verified): 1)   Hyzaar 100-25 Mg Tabs (Losartan Potassium-Hctz) .... Take 1 Tablet By Mouth Once A Day 2)  Aspirin 81 Mg Tbec (Aspirin) .... Take 1 Tablet By Mouth Once A Day 3)  Janumet 50-500 Mg Tabs (Sitagliptin-Metformin Hcl) .... Take 1 Tablet By Mouth Two Times A Day 4)  Freestyle Lite Test   Strp (Glucose Blood) .... Use To Test Blood Glucsoe Once Time A Day 5)  Lancets   Misc (Lancets) .... Use To Test Blood Glucose One Time A Day 6)  Freestyle Control Solution   Liqd (Blood Glucose Calibration) .... Use To Calibrate Meter and Assure Quality Readings. 7)  Catapres 0.1 Mg Tabs (Clonidine Hcl) .... Take 2 Tablets By Mouth At Bedtime 8)  Prodigy No Coding Blood Gluc  Strp (Glucose Blood) .... Use To Check Your Sugar 1-2 Times A Day 9)  Prodigy Blood Glucose Monitor  Devi (Blood Glucose Monitoring Suppl) .... Use To Check Your Blood Sugar 10)  Premarin 0.625 Mg/gm Crea (Estrogens, Conjugated) .... Apply Once Daily For 2 Weeks. Twice Weekly Thereafter 11)  Claritin 10 Mg Tabs (Loratadine) .... Take 1 Tablet By Mouth Once A Day 12)  Cortisporin 3.5-10000-1 Soln (Neomycin-Polymyxin-Hc) .... 4 Drops Three Times A Day Into Affected Ear. 13)  Pravachol 40 Mg Tabs (Pravastatin Sodium) .... Take 1 Tablet By Mouth Once A Day At Bedtime 14)  Fluticasone Propionate 50 Mcg/act Susp (Fluticasone Propionate) .... 2 Spray Inside Each Nostril Daily. 15)  Nexium 40 Mg Cpdr (Esomeprazole Magnesium) .... Take 1 Tablet By Mouth Once A Day 16)  Pepcid 20 Mg Tabs (Famotidine) .... Take 1 Tablet By Mouth Two Times A Day 17)  Diphenhydramine Hcl 25 Mg Caps (Diphenhydramine Hcl) .... Take 2 Tabs By Mouth Every 6 Hours. 18)  Tylenol Extra Strength 500 Mg Tabs (Acetaminophen) .... Take 2 Tabs By Mouth Ever 8 Hours As Needed For Pain. 19)  Hydrocortisone 1 % Crea (Hydrocortisone) .... Apply As Instructed Under Your Breast 2-3 Times Daily. 20)  Neurontin 300 Mg Caps (Gabapentin) .... Take 1 Pill At Bedtime 21)  Buproban 150 Mg Xr12h-Tab  (Bupropion Hcl (Smoking Deter)) .... Take 1 Pill By Mouth Two Times A Day  Allergies: 1)  ! Codeine 2)  ! Vicodin 3)  ! Doxycycline Hyclate (Doxycycline Hyclate) 4)  Penicillin V Potassium (Penicillin V Potassium)  Social History: Packs/Day:  1/2 ppd  Review of Systems      See HPI  Physical Exam  Lungs:  normal breath sounds, no crackles, and no wheezes.   Heart:  normal rate, regular rhythm, no murmur, and no gallop.   Extremities:  trace left pedal edema and trace right pedal edema.   Skin:  There is small black papular nodule under the right breast. There is a similar lesion on the back on the right side with scratch marks. No similar lesions noted on the legs.    Impression & Recommendations:  Problem # 1:  TRIGGER FINGER, RIGHT THUMB (ICD-727.03) Pain persistent. She had a shot at sports medicine. She is taking her pain meds.   Problem # 2:  CIGARETTE SMOKER (ICD-305.1) Has tried nicotine patch and did not like it. Willing to try wellbutrin.  Her updated medication list for this problem includes:    Buproban 150 Mg Xr12h-tab (Bupropion hcl (smoking deter)) .Marland Kitchen... Take 1 pill by mouth two times a day  Problem # 3:  PRURITUS (ICD-698.9) She tried hydrocortisone cream and benadryl, but still has persistent itching. She also has some bumps under her right breast on the back. Will refer to dermatogloy.   Problem # 4:  HOT FLASHES (ICD-627.2) Will try neurontin esp she is also diabetic. Will f/u in a month.  Her updated medication list for this problem includes:    Premarin 0.625 Mg/gm Crea (Estrogens, conjugated) .Marland Kitchen... Apply once daily for 2 weeks. twice weekly thereafter  Complete Medication List: 1)  Hyzaar 100-25 Mg Tabs (Losartan potassium-hctz) .... Take 1 tablet by mouth once a day 2)  Aspirin 81 Mg Tbec (Aspirin) .... Take 1 tablet by mouth once a day 3)  Janumet 50-500 Mg Tabs (Sitagliptin-metformin hcl) .... Take 1 tablet by mouth two times a day 4)  Freestyle  Lite Test Strp (Glucose blood) .... Use to test blood glucsoe once time a day 5)  Lancets Misc (Lancets) .... Use to test blood glucose one time a day 6)  Freestyle Control Solution Liqd (Blood glucose calibration) .... Use to calibrate meter and assure quality readings. 7)  Catapres 0.1 Mg Tabs (Clonidine hcl) .... Take 2 tablets by mouth at bedtime 8)  Prodigy No Coding Blood Gluc Strp (Glucose blood) .... Use to check your sugar 1-2  times a day 9)  Prodigy Blood Glucose Monitor Devi (Blood glucose monitoring suppl) .... Use to check your blood sugar 10)  Premarin 0.625 Mg/gm Crea (Estrogens, conjugated) .... Apply once daily for 2 weeks. twice weekly thereafter 11)  Claritin 10 Mg Tabs (Loratadine) .... Take 1 tablet by mouth once a day 12)  Cortisporin 3.5-10000-1 Soln (Neomycin-polymyxin-hc) .... 4 drops three times a day into affected ear. 13)  Pravachol 40 Mg Tabs (Pravastatin sodium) .... Take 1 tablet by mouth once a day at bedtime 14)  Fluticasone Propionate 50 Mcg/act Susp (Fluticasone propionate) .... 2 spray inside each nostril daily. 15)  Nexium 40 Mg Cpdr (Esomeprazole magnesium) .... Take 1 tablet by mouth once a day 16)  Pepcid 20 Mg Tabs (Famotidine) .... Take 1 tablet by mouth two times a day 17)  Diphenhydramine Hcl 25 Mg Caps (Diphenhydramine hcl) .... Take 2 tabs by mouth every 6 hours. 18)  Tylenol Extra Strength 500 Mg Tabs (Acetaminophen) .... Take 2 tabs by mouth ever 8 hours as needed for pain. 19)  Hydrocortisone 1 % Crea (Hydrocortisone) .... Apply as instructed under your breast 2-3 times daily. 20)  Neurontin 300 Mg Caps (Gabapentin) .... Take 1 pill at bedtime 21)  Buproban 150 Mg Xr12h-tab (Bupropion hcl (smoking deter)) .... Take 1 pill by mouth two times a day  Patient Instructions: 1)  Please schedule a follow-up appointment in 1 month. 2)  Limit your Sodium (Salt) to less than 2 grams a day(slightly less than 1/2 a teaspoon) to prevent fluid retention,  swelling, or worsening of symptoms. 3)  Tobacco is very bad for your health and your loved ones! You Should stop smoking!. 4)  Stop Smoking Tips: Choose a Quit date. Cut down before the Quit date. decide what you will do as a substitute when you feel the urge to smoke(gum,toothpick,exercise). 5)  It is important that you exercise regularly at least 20 minutes 5 times a week. If you develop chest pain, have severe difficulty breathing, or feel very tired , stop exercising immediately and seek medical attention. 6)  You need to lose weight. Consider a lower calorie diet and regular exercise.  7)  Check your Blood Pressure regularly. If it is above: you should make an appointment. Prescriptions: NEURONTIN 300 MG CAPS (GABAPENTIN) take 1 pill at bedtime  #30 x 1   Entered and Authorized by:   Jason Coop MD   Signed by:   Jason Coop MD on 03/27/2010   Method used:   Electronically to        [facility] [id number] (retail)       [address] [place], [place]  [address]       Ph: [phone]       Fax: [phone]   RxID:   [id number] BUPROBAN 150 MG XR12H-TAB (BUPROPION HCL (SMOKING DETER)) take 1 pill by mouth two times a day  #60 x 1   Entered and Authorized by:   Jason Coop MD   Signed by:   Jason Coop MD on 03/27/2010   Method used:   Electronically to        [facility] [id number] (retail)       [address] [place], [place]  [address]       Ph: [phone]  Fax: [phone]   RxID:   [id number]   Prevention & Chronic Care Immunizations   Influenza vaccine: Not documented   Influenza vaccine deferral: Refused  (05/11/2009)    Tetanus booster: 05/18/2009: Td   Td booster deferral: Deferred  (11/16/2009)    Pneumococcal vaccine: Not documented   Pneumococcal vaccine deferral: Deferred  (11/16/2009)  Colorectal Screening   Hemoccult: Negative  (12/25/2005)     Colonoscopy: One 4 mm polyp in the proximal ascending colon, resected and retrieved.  Two 3-5 mm polyps in the proximal ascending colon, resected and retrieved. Pathology results pending. Exam done by Dr.Vincent Schooler.  (03/22/2008)   Colonoscopy action/deferral: Repeat colonoscopy in 1-3 months.    (01/06/2008)   Colonoscopy due: 03/23/2011  Other Screening   Pap smear: NEGATIVE FOR INTRAEPITHELIAL LESIONS OR MALIGNANCY.  (02/28/2009)   Pap smear due: 02/29/2012    Mammogram: No specific mammographic evidence of malignancy.  Assessment: BIRADS 1.   (03/04/2008)   Mammogram action/deferral: Ordered  (05/11/2009)   Mammogram due: 03/2009   Smoking status: current  (03/27/2010)   Smoking cessation counseling: yes  (03/27/2010)  Diabetes Mellitus   HgbA1C: 6.9  (02/09/2010)    Eye exam: No diabetic retinopathy.     (02/08/2009)   Eye exam due: 02/08/2010    Foot exam: yes  (08/31/2008)   Foot exam action/deferral: Deferred   High risk foot: No  (06/28/2008)   Foot care education: Done  (05/11/2009)    Urine microalbumin/creatinine ratio: 6.8  (06/28/2008)   Urine microalbumin action/deferral: Deferred   Urine microalbumin/cr due: 06/28/2009  Lipids   Total Cholesterol: 236  (11/17/2009)   Lipid panel action/deferral: Lipid Panel ordered   LDL: 129  (11/17/2009)   LDL Direct: Not documented   HDL: 50  (11/17/2009)   Triglycerides: 284  (11/17/2009)   Lipid panel due: 01/05/2010    SGOT (AST): 17  (11/17/2009)   BMP action: Ordered   SGPT (ALT): 19  (11/17/2009)   Alkaline phosphatase: 70  (11/17/2009)   Total bilirubin: 0.3  (11/17/2009)  Hypertension   Last Blood Pressure: 139 / 90  (03/27/2010)   Serum creatinine: 0.77  (11/17/2009)   BMP action: Ordered   Serum potassium 4.2  (11/17/2009)   Basic metabolic panel due: 01/05/2010  Self-Management Support :   Personal Goals (by the next clinic visit) :     Personal A1C goal: 6  (11/16/2009)     Personal  blood pressure goal: 130/80  (05/11/2009)     Personal LDL goal: 70  (11/16/2009)    Patient will work on the following items until the next clinic visit to reach self-care goals:     Medications and monitoring: take my medicines every day, check my blood sugar, examine my feet every day  (03/27/2010)     Eating: drink diet soda or water instead of juice or soda, eat more vegetables, use fresh or frozen vegetables, eat baked foods instead of fried foods, eat fruit for snacks and desserts, limit or avoid alcohol  (03/27/2010)     Activity: take a 30 minute walk every day, take the stairs instead of the elevator, park at the far end of the parking lot  (03/27/2010)     Home glucose monitoring frequency: 1 time daily  (11/16/2009)    Diabetes self-management support: Written self-care plan, Education handout, Resources for patients handout  (03/27/2010)   Diabetes care plan printed   Diabetes education handout printed   Last diabetes self-management training by diabetes educator:  01/05/2009    Hypertension self-management support: Written self-care plan, Education handout, Resources for patients handout  (03/27/2010)   Hypertension self-care plan printed.   Hypertension education handout printed    Lipid self-management support: Written self-care plan, Education handout, Resources for patients handout  (03/27/2010)   Lipid self-care plan printed.   Lipid education handout printed      Resource handout printed.    Appended Document: FU/SB. Pt aware of appt Dr Algis Downs. Yetta Barre 03/30/10 10:20AM - info faxed.

## 2010-11-07 NOTE — Assessment & Plan Note (Signed)
Summary: EST-1 MONTH F/U VISIT/CH   Vital Signs:  Patient profile:   55 year old female Height:      65 inches (165.10 cm) Weight:      186 pounds (84.55 kg) BMI:     31.06 Temp:     97.7 degrees F Pulse rate:   92 / minute BP sitting:   154 / 89  (left arm) Cuff size:   large  Vitals Entered By: Dorie Rank RN (May 01, 2010 1:50 PM) CC: check up - right ear hurting about 1 week and h/a on right side of head "probably sinus " - "nose runs clear all the time" Is Patient Diabetic? Yes Did you bring your meter with you today? No Pain Assessment Patient in pain? yes     Location: right ear and right side of head Intensity: 8 Type: aching Onset of pain  about week - on and off h/a "sharp" Nutritional Status BMI of > 30 = obese CBG Result 82  Have you ever been in a relationship where you felt threatened, hurt or afraid?No   Does patient need assistance? Functional Status Self care Ambulation Normal   Diabetic Foot Exam Last Podiatry Exam Date: 08/31/2008 Foot Inspection Is there a history of a foot ulcer?              No Is there a foot ulcer now?              No Can the patient see the bottom of their feet?          Yes Are the shoes appropriate in style and fit?          No Is there swelling or an abnormal foot shape?          No Are the toenails long?                No Are the toenails thick?                No Are the toenails ingrown?              No Is there heavy callous build-up?              No Is there pain in the calf muscle (Intermittent claudication) when walking?    NoIs there a claw toe deformity?              No Is there elevated skin temperature?            No Is there limited ankle dorsiflexion?            No Is there foot or ankle muscle weakness?            No  Diabetic Foot Care Education Patient educated on appropriate care of diabetic feet.  Comments: small "bump" bottom of right foot tried to "cut off" - advised to use pumice stone and not  "cut" - wearing sandals this summer   10-g (5.07) Semmes-Weinstein Monofilament Test Performed by: Dorie Rank RN          Right Foot          Left Foot Visual Inspection     normal           normal Site 1         normal         normal Site 2         normal  normal Site 3         normal         normal Site 4         normal         normal Site 5         normal         normal Site 6         normal         normal Site 9         normal         normal  Impression      normal         normal  Legend:  Site 1 = Plantar aspect of first toe (center of pad) Site 2 = Plantar aspect of third toe (center of pad) Site 3 = Plantar aspect of fifth toe (center of pad) Site 4 = Plantar aspect of first metatarsal head Site 5 = Plantar aspect of third metatarsal head Site 6 = Plantar aspect of fifth metatarsal head Site 7 = Plantar aspect of medial midfoot Site 8 = Plantar aspect of lateral midfoot Site 9 = Plantar aspect of heel Site 10 = dorsal aspect of foot between the base of the first and second toes   Result is Abnormal if patient was unable to perceive the monofilament at site indicated.    Primary Care Provider:  Blondell Reveal MD  CC:  check up - right ear hurting about 1 week and h/a on right side of head "probably sinus " - "nose runs clear all the time".  History of Present Illness: Bridget Owens comes for a regular office visit.  1. Patient reports that her right ear is hurting. Deep inside the year. Denies any difficulty hearing, discharge from the ear. She reports that she has been having headaches on the right side. She denies any mirgaines. She reports that she takes Tylenol for headache and helps a little bit.  2. Patient reports that she has been trying to cut smoking and she is smoking 6 cig compared to 10 previously. She denies any other complaints.  3. She is requesting for a medicine for sleep. She takes Clonidine at night for hot flashes and reports that  it  doesn't help.      Preventive Screening-Counseling & Management  Alcohol-Tobacco     Alcohol drinks/day: 0     Smoking Status: current     Smoking Cessation Counseling: yes     Packs/Day: 1/2 ppd     Year Started: at the age of 49     Passive Smoke Exposure: yes  Caffeine-Diet-Exercise     Does Patient Exercise: no     Type of exercise: walkiing     Times/week: 2  Comments: averages 6 cigs a day  Problems Prior to Update: 1)  Hot Flashes  (ICD-627.2) 2)  Osteoarthritis, Carpometacarpal Joint, Right Thumb  (ICD-715.94) 3)  Trigger Finger, Right Thumb  (ICD-727.03) 4)  Sebaceous Cyst, Breast  (ICD-610.8) 5)  Preventive Health Care  (ICD-V70.0) 6)  Cigarette Smoker  (ICD-305.1) 7)  Insomnia  (ICD-780.52) 8)  Bell's Palsy, Left  (ICD-351.0) 9)  Allergic Rhinitis  (ICD-477.9) 10)  Adenomatous Colonic Polyp  (ICD-211.3) 11)  Diabetes Mellitus, Type II  (ICD-250.00) 12)  Hypertension  (ICD-401.9) 13)  Hypertensive Retinopathy  (ICD-362.11) 14)  Dyslipidemia  (ICD-272.4) 15)  Pruritus  (ICD-698.9) 16)  Diabetic Peripheral Neuropathy  (ICD-250.60) 17)  Health Maintenance Exam  (ICD-V70.0) 18)  Gerd  (ICD-530.81) 19)  Hx of Leg Cramps  (ICD-729.82) 20)  Fibroids, Uterus  (ICD-218.9) 21)  Perimenopausal Status  (ICD-627.2) 22)  External Hemorrhoids  (ICD-455.3)  Medications Prior to Update: 1)  Hyzaar 100-25 Mg Tabs (Losartan Potassium-Hctz) .... Take 1 Tablet By Mouth Once A Day 2)  Aspirin 81 Mg Tbec (Aspirin) .... Take 1 Tablet By Mouth Once A Day 3)  Janumet 50-500 Mg Tabs (Sitagliptin-Metformin Hcl) .... Take 1 Tablet By Mouth Two Times A Day 4)  Freestyle Lite Test   Strp (Glucose Blood) .... Use To Test Blood Glucsoe Once Time A Day 5)  Lancets   Misc (Lancets) .... Use To Test Blood Glucose One Time A Day 6)  Freestyle Control Solution   Liqd (Blood Glucose Calibration) .... Use To Calibrate Meter and Assure Quality Readings. 7)  Catapres 0.1 Mg Tabs (Clonidine  Hcl) .... Take 2 Tablets By Mouth At Bedtime 8)  Prodigy No Coding Blood Gluc  Strp (Glucose Blood) .... Use To Check Your Sugar 1-2 Times A Day 9)  Prodigy Blood Glucose Monitor  Devi (Blood Glucose Monitoring Suppl) .... Use To Check Your Blood Sugar 10)  Premarin 0.625 Mg/gm Crea (Estrogens, Conjugated) .... Apply Once Daily For 2 Weeks. Twice Weekly Thereafter 11)  Claritin 10 Mg Tabs (Loratadine) .... Take 1 Tablet By Mouth Once A Day 12)  Cortisporin 3.5-10000-1 Soln (Neomycin-Polymyxin-Hc) .... 4 Drops Three Times A Day Into Affected Ear. 13)  Pravachol 40 Mg Tabs (Pravastatin Sodium) .... Take 1 Tablet By Mouth Once A Day At Bedtime 14)  Fluticasone Propionate 50 Mcg/act Susp (Fluticasone Propionate) .... 2 Spray Inside Each Nostril Daily. 15)  Nexium 40 Mg Cpdr (Esomeprazole Magnesium) .... Take 1 Tablet By Mouth Once A Day 16)  Pepcid 20 Mg Tabs (Famotidine) .... Take 1 Tablet By Mouth Two Times A Day 17)  Diphenhydramine Hcl 25 Mg Caps (Diphenhydramine Hcl) .... Take 2 Tabs By Mouth Every 6 Hours. 18)  Tylenol Extra Strength 500 Mg Tabs (Acetaminophen) .... Take 2 Tabs By Mouth Ever 8 Hours As Needed For Pain. 19)  Hydrocortisone 1 % Crea (Hydrocortisone) .... Apply As Instructed Under Your Breast 2-3 Times Daily. 20)  Neurontin 300 Mg Caps (Gabapentin) .... Take 1 Pill At Bedtime 21)  Buproban 150 Mg Xr12h-Tab (Bupropion Hcl (Smoking Deter)) .... Take 1 Pill By Mouth Two Times A Day  Current Medications (verified): 1)  Hyzaar 100-25 Mg Tabs (Losartan Potassium-Hctz) .... Take 1 Tablet By Mouth Once A Day 2)  Aspirin 81 Mg Tbec (Aspirin) .... Take 1 Tablet By Mouth Once A Day 3)  Janumet 50-500 Mg Tabs (Sitagliptin-Metformin Hcl) .... Take 1 Tablet By Mouth Two Times A Day 4)  Freestyle Lite Test   Strp (Glucose Blood) .... Use To Test Blood Glucsoe Once Time A Day 5)  Lancets   Misc (Lancets) .... Use To Test Blood Glucose One Time A Day 6)  Freestyle Control Solution   Liqd (Blood  Glucose Calibration) .... Use To Calibrate Meter and Assure Quality Readings. 7)  Catapres 0.1 Mg Tabs (Clonidine Hcl) .... Take 2 Tablets By Mouth At Bedtime 8)  Prodigy No Coding Blood Gluc  Strp (Glucose Blood) .... Use To Check Your Sugar 1-2 Times A Day 9)  Prodigy Blood Glucose Monitor  Devi (Blood Glucose Monitoring Suppl) .... Use To Check Your Blood Sugar 10)  Premarin 0.625 Mg/gm Crea (Estrogens, Conjugated) .... Apply Once Daily For 2 Weeks. Twice Weekly Thereafter 11)  Claritin 10 Mg  Tabs (Loratadine) .... Take 1 Tablet By Mouth Once A Day 12)  Cortisporin 3.5-10000-1 Soln (Neomycin-Polymyxin-Hc) .... 4 Drops Three Times A Day Into Affected Ear. 13)  Pravachol 40 Mg Tabs (Pravastatin Sodium) .... Take 1 Tablet By Mouth Once A Day At Bedtime 14)  Fluticasone Propionate 50 Mcg/act Susp (Fluticasone Propionate) .... 2 Spray Inside Each Nostril Daily. 15)  Nexium 40 Mg Cpdr (Esomeprazole Magnesium) .... Take 1 Tablet By Mouth Once A Day 16)  Pepcid 20 Mg Tabs (Famotidine) .... Take 1 Tablet By Mouth Two Times A Day 17)  Diphenhydramine Hcl 25 Mg Caps (Diphenhydramine Hcl) .... Take 2 Tabs By Mouth Every 6 Hours. 18)  Tylenol Extra Strength 500 Mg Tabs (Acetaminophen) .... Take 2 Tabs By Mouth Ever 8 Hours As Needed For Pain. 19)  Hydrocortisone 1 % Crea (Hydrocortisone) .... Apply As Instructed Under Your Breast 2-3 Times Daily. 20)  Neurontin 300 Mg Caps (Gabapentin) .... Take 1 Pill At Bedtime 21)  Buproban 150 Mg Xr12h-Tab (Bupropion Hcl (Smoking Deter)) .... Take 1 Pill By Mouth Two Times A Day  Allergies (verified): 1)  ! Codeine 2)  ! Vicodin 3)  ! Doxycycline Hyclate (Doxycycline Hyclate) 4)  Penicillin V Potassium (Penicillin V Potassium)  Past History:  Past Medical History: Last updated: 02/28/2009 L sided Bell's palsy 01/2009 Diabetes mellitus, type II Hypertension Headache Tobacco use Hyperlipidemia Allergic rhinitis Hx leg cramps on Lipitor GERD Uterine  fibroids Hx chest pain : exercise stress test negative 06-07 Perimenopausal ? Insomnia External hemorrhoids  Past Surgical History: Last updated: 05/11/2009 none  Family History: Last updated: 27-Sep-2008 M: died with bad cough 1969 @ 57yo, not sure about health history S: died with bad cough 1996 @ 57yo, of pneumonia  Social History: Last updated: 11/12/2006 Current Smoker  Risk Factors: Alcohol Use: 0 (05/01/2010) Exercise: no (05/01/2010)  Risk Factors: Smoking Status: current (05/01/2010) Packs/Day: 1/2 ppd (05/01/2010) Passive Smoke Exposure: yes (05/01/2010)  Family History: Reviewed history from 09-27-2008 and no changes required. M: died with bad cough 1969 @ 57yo, not sure about health history S: died with bad cough 1996 @ 55yo, of pneumonia  Social History: Reviewed history from 11/12/2006 and no changes required. Current Smoker  Review of Systems      See HPI  Physical Exam  General:  alert, well-developed, and well-nourished.   Head:  normocephalic and atraumatic.   Ears:  Both external ears, ear canals look good with out any wax or discharge. Mouth:  Oral mucosa and oropharynx without lesions or exudates.  poor dentition. Neck:  supple and full ROM.   Lungs:  normal respiratory effort, no intercostal retractions, no accessory muscle use, and normal breath sounds.   Heart:  normal rate, regular rhythm, no murmur, and no gallop.   Abdomen:  soft, non-tender, normal bowel sounds, and no distention.   Pulses:  R radial normal.   Extremities:  trace left pedal edema and trace right pedal edema.   Neurologic:  alert & oriented X3, cranial nerves II-XII intact, strength normal in all extremities, and gait normal.    Diabetes Management Exam:    Foot Exam (with socks and/or shoes not present):       Sensory-Monofilament:          Left foot: normal          Right foot: normal   Impression & Recommendations:  Problem # 1:  CIGARETTE SMOKER  (ICD-305.1) Councelled her extensively about cessation. Patient reports that she is down to  6 cig/day compared to 10 cig/day. Recommended to slowly cut down on smoking and stop in 6-9 months.  Her updated medication list for this problem includes:    Buproban 150 Mg Xr12h-tab (Bupropion hcl (smoking deter)) .Marland Kitchen... Take 1 pill by mouth two times a day  Problem # 2:  DIABETES MELLITUS, TYPE II (ICD-250.00) Exellent control. Continue current medicines.  Her updated medication list for this problem includes:    Hyzaar 100-25 Mg Tabs (Losartan potassium-hctz) .Marland Kitchen... Take 1 tablet by mouth once a day    Aspirin 81 Mg Tbec (Aspirin) .Marland Kitchen... Take 1 tablet by mouth once a day    Janumet 50-500 Mg Tabs (Sitagliptin-metformin hcl) .Marland Kitchen... Take 1 tablet by mouth two times a day  Problem # 3:  INSOMNIA (ICD-780.52)  Patient reports that she hasn't been able to sleep well because of her Hot flashes, despite clonidine. Will try Ambien 10 mg at night.Recommended to take only as needed.  Her updated medication list for this problem includes:    Zolpidem Tartrate 10 Mg Tabs (Zolpidem tartrate) .Marland Kitchen... Take 1 tablet by mouth once a day  Problem # 4:  HYPERTENSION (ICD-401.9) SBP slightly high. Reports she hasn't taken her medicines in 2 days. Will continue the current regimen.  Her updated medication list for this problem includes:    Hyzaar 100-25 Mg Tabs (Losartan potassium-hctz) .Marland Kitchen... Take 1 tablet by mouth once a day    Catapres 0.1 Mg Tabs (Clonidine hcl) .Marland Kitchen... Take 2 tablets by mouth at bedtime  Problem # 5:  HEADACHE (ICD-784.0) It seems like she is having mild mirgaine spells, given unilateral mild headache along with right ear pain. Given that Tylenol helping her headaches, would continue tylenol for now. Patient reports that she sleep only 3-4 hours a day, so I suspect may this is contributing to her headache.  Her updated medication list for this problem includes:    Aspirin 81 Mg Tbec (Aspirin) .Marland Kitchen...  Take 1 tablet by mouth once a day    Tylenol Extra Strength 500 Mg Tabs (Acetaminophen) .Marland Kitchen... Take 2 tabs by mouth ever 8 hours as needed for pain.  Complete Medication List: 1)  Hyzaar 100-25 Mg Tabs (Losartan potassium-hctz) .... Take 1 tablet by mouth once a day 2)  Aspirin 81 Mg Tbec (Aspirin) .... Take 1 tablet by mouth once a day 3)  Janumet 50-500 Mg Tabs (Sitagliptin-metformin hcl) .... Take 1 tablet by mouth two times a day 4)  Freestyle Lite Test Strp (Glucose blood) .... Use to test blood glucsoe once time a day 5)  Lancets Misc (Lancets) .... Use to test blood glucose one time a day 6)  Freestyle Control Solution Liqd (Blood glucose calibration) .... Use to calibrate meter and assure quality readings. 7)  Catapres 0.1 Mg Tabs (Clonidine hcl) .... Take 2 tablets by mouth at bedtime 8)  Prodigy No Coding Blood Gluc Strp (Glucose blood) .... Use to check your sugar 1-2 times a day 9)  Prodigy Blood Glucose Monitor Devi (Blood glucose monitoring suppl) .... Use to check your blood sugar 10)  Premarin 0.625 Mg/gm Crea (Estrogens, conjugated) .... Apply once daily for 2 weeks. twice weekly thereafter 11)  Claritin 10 Mg Tabs (Loratadine) .... Take 1 tablet by mouth once a day 12)  Cortisporin 3.5-10000-1 Soln (Neomycin-polymyxin-hc) .... 4 drops three times a day into affected ear. 13)  Pravachol 40 Mg Tabs (Pravastatin sodium) .... Take 1 tablet by mouth once a day at bedtime 14)  Fluticasone Propionate 50 Mcg/act  Susp (Fluticasone propionate) .... 2 spray inside each nostril daily. 15)  Nexium 40 Mg Cpdr (Esomeprazole magnesium) .... Take 1 tablet by mouth once a day 16)  Pepcid 20 Mg Tabs (Famotidine) .... Take 1 tablet by mouth two times a day 17)  Diphenhydramine Hcl 25 Mg Caps (Diphenhydramine hcl) .... Take 2 tabs by mouth every 6 hours. 18)  Tylenol Extra Strength 500 Mg Tabs (Acetaminophen) .... Take 2 tabs by mouth ever 8 hours as needed for pain. 19)  Hydrocortisone 1 % Crea  (Hydrocortisone) .... Apply as instructed under your breast 2-3 times daily. 20)  Neurontin 300 Mg Caps (Gabapentin) .... Take 1 pill at bedtime 21)  Buproban 150 Mg Xr12h-tab (Bupropion hcl (smoking deter)) .... Take 1 pill by mouth two times a day 22)  Zolpidem Tartrate 10 Mg Tabs (Zolpidem tartrate) .... Take 1 tablet by mouth once a day  Other Orders: T-Hgb A1C (in-house) ([id number]) T- Capillary Blood Glucose (19147)  Patient Instructions: 1)  Please schedule a follow-up appointment in 3 months. 2)  Take all the medications as instructed below. 3)  Tobacco is very bad for your health and your loved ones! You Should stop smoking!. 4)  Stop Smoking Tips: Choose a Quit date. Cut down before the Quit date. decide what you will do as a substitute when you feel the urge to smoke(gum,toothpick,exercise). 5)  It is important that you exercise regularly at least 20 minutes 5 times a week. If you develop chest pain, have severe difficulty breathing, or feel very tired , stop exercising immediately and seek medical attention. 6)  You need to lose weight. Consider a lower calorie diet and regular exercise.  Prescriptions: ZOLPIDEM TARTRATE 10 MG TABS (ZOLPIDEM TARTRATE) Take 1 tablet by mouth once a day  #30 x 3   Entered and Authorized by:   Blondell Reveal MD   Signed by:   Blondell Reveal MD on 05/01/2010   Method used:   Print then Give to Patient   RxID:   [id number] ZOLPIDEM TARTRATE 10 MG TABS (ZOLPIDEM TARTRATE) Take 1 tablet by mouth once a day  #30 x 3   Entered and Authorized by:   Blondell Reveal MD   Signed by:   Blondell Reveal MD on 05/01/2010   Method used:   Print then Give to Patient   RxID:   [id number] ZOLPIDEM TARTRATE 10 MG TABS (ZOLPIDEM TARTRATE) Take 1 tablet by mouth once a day  #30 x 3   Entered and Authorized by:   Blondell Reveal MD   Signed by:   Blondell Reveal MD on 05/01/2010   Method used:   Print then Give to Patient   RxID:   [id number]  Prescriptions  printed thrice by mistake. Only one prescription handed over.    Prevention & Chronic Care Immunizations   Influenza vaccine: Not documented   Influenza vaccine deferral: Not indicated  (05/01/2010)    Tetanus booster: 05/18/2009: Td   Td booster deferral: Not indicated  (05/01/2010)    Pneumococcal vaccine: Not documented   Pneumococcal vaccine deferral: Not indicated  (05/01/2010)  Colorectal Screening   Hemoccult: Negative  (12/25/2005)   Hemoccult action/deferral: Not indicated  (05/01/2010)    Colonoscopy: One 4 mm polyp in the proximal ascending colon, resected and retrieved.  Two 3-5 mm polyps in the proximal ascending colon, resected and retrieved. Pathology results pending. Exam done by Dr.Vincent Schooler.  (03/22/2008)   Colonoscopy action/deferral: Not indicated  (05/01/2010)   Colonoscopy due: 03/23/2011  Other Screening   Pap smear: NEGATIVE FOR INTRAEPITHELIAL LESIONS OR MALIGNANCY.  (02/28/2009)   Pap smear action/deferral: Not indicated-other  (05/01/2010)   Pap smear due: 02/29/2012    Mammogram: No specific mammographic evidence of malignancy.  Assessment: BIRADS 1.   (03/04/2008)   Mammogram action/deferral: Refused  (05/01/2010)   Mammogram due: 03/2009   Smoking status: current  (05/01/2010)   Smoking cessation counseling: yes  (05/01/2010)    Screening comments: reports will consider mammogram later.  Diabetes Mellitus   HgbA1C: 6.6  (05/01/2010)   HgbA1C action/deferral: Ordered  (05/01/2010)    Eye exam: No diabetic retinopathy.     (02/08/2009)   Eye exam due: 02/08/2010    Foot exam: yes  (05/01/2010)   Foot exam action/deferral: Do today   High risk foot: No  (06/28/2008)   Foot care education: Done  (05/01/2010)    Urine microalbumin/creatinine ratio: 6.8  (06/28/2008)   Urine microalbumin action/deferral: Deferred   Urine microalbumin/cr due: 06/28/2009  Lipids   Total Cholesterol: 236  (11/17/2009)   Lipid panel  action/deferral: Lipid Panel ordered   LDL: 129  (11/17/2009)   LDL Direct: Not documented   HDL: 50  (11/17/2009)   Triglycerides: 284  (11/17/2009)   Lipid panel due: 01/05/2010    SGOT (AST): 17  (11/17/2009)   BMP action: Ordered   SGPT (ALT): 19  (11/17/2009)   Alkaline phosphatase: 70  (11/17/2009)   Total bilirubin: 0.3  (11/17/2009)  Hypertension   Last Blood Pressure: 154 / 89  (05/01/2010)   Serum creatinine: 0.77  (11/17/2009)   BMP action: Ordered   Serum potassium 4.2  (11/17/2009)   Basic metabolic panel due: 01/05/2010  Self-Management Support :   Personal Goals (by the next clinic visit) :     Personal A1C goal: 6  (11/16/2009)     Personal blood pressure goal: 130/80  (05/11/2009)     Personal LDL goal: 70  (11/16/2009)    Patient will work on the following items until the next clinic visit to reach self-care goals:     Medications and monitoring: take my medicines every day, bring all of my medications to every visit, examine my feet every day  (05/01/2010)     Eating: drink diet soda or water instead of juice or soda, eat more vegetables, use fresh or frozen vegetables, eat baked foods instead of fried foods  (05/01/2010)     Activity: take a 30 minute walk every day  (05/01/2010)     Other: walk every night  (05/01/2010)     Home glucose monitoring frequency: 1 time daily  (11/16/2009)    Diabetes self-management support: Education handout, Resources for patients handout, Written self-care plan  (05/01/2010)   Diabetes care plan printed   Diabetes education handout printed   Last diabetes self-management training by diabetes educator: 01/05/2009    Hypertension self-management support: Education handout, Resources for patients handout, Written self-care plan  (05/01/2010)   Hypertension self-care plan printed.   Hypertension education handout printed    Lipid self-management support: Education handout, Resources for patients handout, Written self-care  plan  (05/01/2010)   Lipid self-care plan printed.   Lipid education handout printed      Resource handout printed.   Nursing Instructions: HgbA1C today (see order) CBG today (see order) Diabetic foot exam today    Laboratory Results   Blood Tests   Date/Time Received: May 01, 2010 2:16 PM Date/Time Reported: Alric Quan  May 01, 2010 2:16 PM  HGBA1C: 6.6%   (Normal Range: Non-Diabetic - 3-6%   Control Diabetic - 6-8%) CBG Random:: 82mg /dL

## 2010-11-07 NOTE — Progress Notes (Signed)
Summary: Prescription denial  Phone Note Outgoing Call   Call placed by: Angelina Ok RN,  May 08, 2009 3:45 PM Call placed to: Insurer Summary of Call: Call to [facility] denied pay on prescription for Nasonex.  Pt needs a trail on Nasalide. Angelina Ok RN  May 19, 2009 9:31 AM   Follow-up for Phone Call        Will change to Flunisolide (nasalide) and refill. Follow-up by: Blondell Reveal MD,  May 19, 2009 12:23 PM    New/Updated Medications: FLUNISOLIDE 0.025 % SOLN (FLUNISOLIDE) 2 sprays in each nostril twice daily Prescriptions: FLUNISOLIDE 0.025 % SOLN (FLUNISOLIDE) 2 sprays in each nostril twice daily  #1 x 3   Entered and Authorized by:   Blondell Reveal MD   Signed by:   Blondell Reveal MD on 05/19/2009   Method used:   Electronically to        [facility] [id number] (retail)       [address]       [place], [place]  [address]       Ph: [phone]       Fax: [phone]   RxID:   [id number]

## 2010-11-07 NOTE — Consult Note (Signed)
Summary: Ophthalmology: Dr. Elmer Picker  Ophthalmology: Dr. Elmer Picker   Imported By: Florinda Marker 02/15/2009 16:23:56  _____________________________________________________________________  External Attachment:    Type:   Image     Comment:   External Document  Appended Document: Ophthalmology: Dr. Elmer Picker    Clinical Lists Changes  Observations: Added new observation of DIAB EYE EX: No diabetic retinopathy.    (02/08/2009 19:59)       Diabetic Eye Exam  Procedure date:  02/08/2009  Findings:      No diabetic retinopathy.      Diabetic Eye Exam  Procedure date:  02/08/2009  Findings:      No diabetic retinopathy.

## 2010-11-07 NOTE — Assessment & Plan Note (Signed)
Summary: cough/pcp-boggala/hla   Vital Signs:  Patient Profile:   55 Years Old Female Height:     67 inches (170.18 cm) Weight:      182.1 pounds (82.77 kg) BMI:     28.62 Temp:     97.4 degrees F (36.33 degrees C) oral Pulse rate:   109 / minute BP sitting:   131 / 79  (right arm)  Pt. in pain?   yes    Location:   back,head and chest    Intensity:   10  Vitals Entered By: Stanton Kidney Ditzler RN (August 13, 2008 3:54 PM)              Is Patient Diabetic? Yes Nutritional Status BMI of 25 - 29 = overweight Nutritional Status Detail appetite down CBG Result 151  Have you ever been in a relationship where you felt threatened, hurt or afraid?denies   Does patient need assistance? Functional Status Self care Ambulation Normal     PCP:  Dellia Beckwith MD  Chief Complaint:  Past month has had nonproductive cough and chest congestion. Leaking urine with cough..  History of Present Illness:  Ms. Holcomb is a 55 yo lady with PMH as outlined in the EMR. She came previously on 08/02/08 for a f/u visit after an ER visit on 07/19/08 at which time she was treated with Z pack.   Today she has a HA. Cough productive of whitish phlegm. No hemoptysis.  No fever. No chills. Has been having hot flashes (perimenopausal). C/o pleuritic chest pain. Sore throat -related to cough. Head congestion, copious clear rhinitis. (Pt blowing nose in large hand towel).  No N/V/D, but some post-tussive gagging, as well as flatulence and urinary incontinence.  Daughter at home with similar symptoms, but children (7 yo boy and 2yo girl, 74 yo girl) have not been sick.  Neighbors downstairs smoking MJ and cocaine, and fumes coming into apartment.  Has tried mucinex, but it didn't help.    Prior Medications Reviewed Using: Patient Recall  Updated Prior Medication List:  List is reviewed by patient recall, however the caveat is that she does not know the names of  her medications, only the first letter  and in some cases what the medication is for. I have asked her to bring pill bottles and her checkout sheet to all future appointments.  PRINZIDE 20-25 MG TABS (LISINOPRIL-HYDROCHLOROTHIAZIDE) Take 1 tablet by mouth once a day ASPIRIN 81 MG TBEC (ASPIRIN) Take 1 tablet by mouth once a day JANUMET 50-500 MG TABS (SITAGLIPTIN-METFORMIN HCL) Take 1 tablet by mouth two times a day TYLENOL 8 HOUR 650 MG  TBCR (ACETAMINOPHEN) Take 1 tablet by mouth three times a day PROTONIX 40 MG  PACK (PANTOPRAZOLE SODIUM) Take one tablet by mouth daily CADUET 5-20 MG TABS (AMLODIPINE-ATORVASTATIN) Take 1 tablet by mouth once a day AMITRIPTYLINE HCL 25 MG  TABS (AMITRIPTYLINE HCL) Take 1 tablet by mouth at bedtime FREESTYLE LITE TEST   STRP (GLUCOSE BLOOD) use to test blood glucsoe once time a day [BMN] LANCETS   MISC (LANCETS) use to test blood glucose one time a day FREESTYLE CONTROL SOLUTION   LIQD (BLOOD GLUCOSE CALIBRATION) use to calibrate meter and assure quality readings. [BMN] CLONIDINE HCL 0.1 MG  TABS (CLONIDINE HCL) Take 1 tablet by mouth at bedtime (says doctor never called in) CETIRIZINE HCL 10 MG CHEW (CETIRIZINE HCL) take 1 pill by mouth daily for 2 weeks.  Regarding her allergies, she is not sure about doxycycline,  but since she does not know the names of the medications she is currently taking, I am hesitant to remove it from the list. Current Allergies (reviewed today): ! CODEINE ! VICODIN ! DOXYCYCLINE HYCLATE (DOXYCYCLINE HYCLATE)  Past Medical History:    Reviewed history from 04/22/2007 and no changes required:       Diabetes mellitus, type II       Hypertension       Headache       Tobacco use       Hyperlipidemia       Allergic rhinitis       Hx leg cramps on Lipitor       GERD       Uterine fibroids       Hx chest pain : exercise stress test negative 06-07       Perimenopausal       Insomnia       External hemorrhoids       Menopause       Atrophic vaginitis    Risk  Factors:  Tobacco use:  current    Year started:  at the age of 68    Cigarettes:  Yes -- 1/2 pack(s) per day    Counseled to quit/cut down tobacco use:  yes Passive smoke exposure:  yes Alcohol use:  no Exercise:  yes    Times per week:  2    Type:  walkiing Seatbelt use:  100 %  Colonoscopy History:    Date of Last Colonoscopy:  03/22/2008  Mammogram History:    Date of Last Mammogram:  03/04/2008  PAP Smear History:    Date of Last PAP Smear:  02/24/2008   Review of Systems  General      Complains of sweats.      Denies fever.  CV      Complains of palpitations.  Resp      Denies shortness of breath.      dyspnea only when coughing  GI      Denies change in bowel habits.  GU      Denies dysuria.  MS      Denies joint pain and muscle aches.  Neuro      Denies numbness and weakness.   Physical Exam  General:     alert, well-developed, and well-nourished.   Head:     normocephalic and atraumatic.   Eyes:     vision grossly intact.   Ears:     no external deformities.   Nose:     no external deformity.   Lungs:     normal respiratory effort, normal breath sounds, no crackles, and no wheezes.   Heart:     normal rate, regular rhythm, and no murmur.   Abdomen:     soft, non-tender, normal bowel sounds, no distention, no masses, no guarding, and no rigidity.   Neurologic:     alert & oriented X3, cranial nerves II-XII intact, strength normal in all extremities, and gait normal.      Impression & Recommendations:  Problem # 1:  COUGH (ZOX-096.0) Discussed with Dr. Aundria Rud. She has had the cough for some time, although it seems she may have caught another viral illness, perhaps from her daughter. She may also have an allergic component, with post nasal drip irritating her and making her cough. Given the timing, there is clearly no role for tamiflu prophylaxis. Her lungs are clear, she is not ill-appearing, she is afebrile and reports no  fevers, so  there is no indication for CXR or antibiotics. Will treat symptomatically with guiafenesin, loratadine, benadryl at night and pseudephed in the am. Follow-up 2 weeks.  Problem # 2:  HYPERTENSION (ICD-401.9) no changes required.  Her updated medication list for this problem includes:    Prinzide 20-25 Mg Tabs (Lisinopril-hydrochlorothiazide) .Marland Kitchen... Take 1 tablet by mouth once a day    Caduet 5-20 Mg Tabs (Amlodipine-atorvastatin) .Marland Kitchen... Take 1 tablet by mouth once a day    Clonidine Hcl 0.1 Mg Tabs (Clonidine hcl) .Marland Kitchen... Take 1 tablet by mouth at bedtime  BP today: 131/79 Prior BP: 155/91 (08/02/2008)  Labs Reviewed: Creat: 0.73 (10/29/2007) Chol: 219 (06/28/2008)   HDL: 46 (06/28/2008)   LDL: 149 (06/28/2008)   TG: 120 (06/28/2008)   Problem # 3:  DIABETES MELLITUS, TYPE II (ICD-250.00) no changes required.  Her updated medication list for this problem includes:    Prinzide 20-25 Mg Tabs (Lisinopril-hydrochlorothiazide) .Marland Kitchen... Take 1 tablet by mouth once a day    Aspirin 81 Mg Tbec (Aspirin) .Marland Kitchen... Take 1 tablet by mouth once a day    Janumet 50-500 Mg Tabs (Sitagliptin-metformin hcl) .Marland Kitchen... Take 1 tablet by mouth two times a day  Orders: Capillary Blood Glucose (91478) Fingerstick (29562)  Labs Reviewed: HgBA1c: 7.1 (06/28/2008)   Creat: 0.73 (10/29/2007)   Microalbumin: 0.43 (06/28/2008)  Last Eye Exam: No diabetic retinopathy.    (04/15/2008)   Problem # 4:  DYSLIPIDEMIA (ICD-272.4) Could consider increasing her statin at her return visit.  Her updated medication list for this problem includes:    Caduet 5-20 Mg Tabs (Amlodipine-atorvastatin) .Marland Kitchen... Take 1 tablet by mouth once a day  Labs Reviewed: Chol: 219 (06/28/2008)   HDL: 46 (06/28/2008)   LDL: 149 (06/28/2008)   TG: 120 (06/28/2008) SGOT: 14 (10/29/2007)   SGPT: 20 (10/29/2007)   Complete Medication List: 1)  Prinzide 20-25 Mg Tabs (Lisinopril-hydrochlorothiazide) .... Take 1 tablet by mouth once a day 2)  Aspirin  81 Mg Tbec (Aspirin) .... Take 1 tablet by mouth once a day 3)  Janumet 50-500 Mg Tabs (Sitagliptin-metformin hcl) .... Take 1 tablet by mouth two times a day 4)  Tylenol 8 Hour 650 Mg Tbcr (Acetaminophen) .... Take 1 tablet by mouth three times a day 5)  Protonix 40 Mg Pack (Pantoprazole sodium) .... Take one tablet by mouth daily 6)  Caduet 5-20 Mg Tabs (Amlodipine-atorvastatin) .... Take 1 tablet by mouth once a day 7)  Amitriptyline Hcl 25 Mg Tabs (Amitriptyline hcl) .... Take 1 tablet by mouth at bedtime 8)  Freestyle Lite Test Strp (Glucose blood) .... Use to test blood glucsoe once time a day 9)  Lancets Misc (Lancets) .... Use to test blood glucose one time a day 10)  Freestyle Control Solution Liqd (Blood glucose calibration) .... Use to calibrate meter and assure quality readings. 11)  Clonidine Hcl 0.1 Mg Tabs (Clonidine hcl) .... Take 1 tablet by mouth at bedtime 12)  Loratadine 10 Mg Tabs (Loratadine) .... Take 1 tablet by mouth once a day 13)  Guaifenesin 400 Mg Tabs (Guaifenesin) .... Take 1 tab by mouth up to every 6 hours as needed for cough 14)  Diphenhist 25 Mg Tabs (Diphenhydramine hcl) .... Take 1 at bedtime for 2 weeks 15)  Af-pseudoephedrine Hcl 30 Mg Tabs (Pseudoephedrine hcl) .... Take 1 by mouth each morning as needed for congestion, for up to 2 weeks   Patient Instructions: 1)  Please schedule a follow-up appointment in 2 weeks. 2)  Call sooner if increasing cough, fever, or new symptoms( shortness of breath, chest pain). 3)  Drink plenty of liquids. 4)  Tobacco is very bad for your health and your loved ones! You Should stop smoking!. 5)  Stop Smoking Tips: Choose a Quit date. Cut down before the Quit date. decide what you will do as a substitute when you feel the urge to smoke(gum,toothpick,exercise).   Prescriptions: LORATADINE 10 MG TABS (LORATADINE) Take 1 tablet by mouth once a day  #93 x 3   Entered and Authorized by:   Loel Dubonnet MD   Signed by:    Loel Dubonnet MD on 08/13/2008   Method used:   Print then Give to Patient   RxID:   [id number] DIPHENHIST 25 MG TABS (DIPHENHYDRAMINE HCL) take 1 at bedtime for 2 weeks  #30 x 0   Entered and Authorized by:   Loel Dubonnet MD   Signed by:   Loel Dubonnet MD on 08/13/2008   Method used:   Print then Give to Patient   RxID:   [id number] GUAIFENESIN 400 MG TABS (GUAIFENESIN) take 1 tab by mouth up to every 6 hours as needed for cough  #60 x 0   Entered and Authorized by:   Loel Dubonnet MD   Signed by:   Loel Dubonnet MD on 08/13/2008   Method used:   Print then Give to Patient   RxID:   [id number] AF-PSEUDOEPHEDRINE HCL 30 MG TABS (PSEUDOEPHEDRINE HCL) take 1 by mouth each morning as needed for congestion, for up to 2 weeks  #14 x 0   Entered and Authorized by:   Loel Dubonnet MD   Signed by:   Loel Dubonnet MD on 08/13/2008   Method used:   Print then Give to Patient   RxID:   [id number] LORATADINE 10 MG TABS (LORATADINE) Take 1 tablet by mouth once a day  #93 x 3   Entered and Authorized by:   Loel Dubonnet MD   Signed by:   Loel Dubonnet MD on 08/13/2008   Method used:   Electronically to        [facility] [id number] (retail)       [address] [place], [place]  [address]       Ph: [phone]       Fax: [phone]   RxID:   [id number]  ]  Vital Signs:  Patient Profile:   55 Years Old Female Height:     67 inches (170.18 cm) Weight:      182.1 pounds (82.77 kg) BMI:     28.62 Temp:     97.4 degrees F (36.33 degrees C) oral Pulse rate:   109 / minute BP sitting:   131 / 79    Location:   back,head and chest    Intensity:   10             CBG Result 151 Last PAP Result  Specimen Adequacy: Satisfactory for evaluation.   Interpretation/Result:Negative for intraepithelial Lesion or Malignancy.   Interpretation/Result:Fungal organisms c/w Candida present.     PapHx   Specimen Adequacy:  Satisfactory for evaluation.   Interpretation/Result:Negative for intraepithelial Lesion or Malignancy.   Interpretation/Result:Fungal organisms c/w Candida present.     (02/24/2008 2:02:09 PM)

## 2010-11-07 NOTE — Assessment & Plan Note (Signed)
Summary: itching all over [mkj]   Vital Signs:  Patient profile:   55 year old female Height:      65 inches (165.10 cm) Weight:      184.5 pounds (83.86 kg) BMI:     30.81 Temp:     97.0 degrees F (36.11 degrees C) oral Pulse rate:   106 / minute BP sitting:   130 / 82  (right arm)  Vitals Entered By: Stanton Kidney Ditzler RN (December 22, 2009 3:17 PM) Is Patient Diabetic? Yes Did you bring your meter with you today? No Pain Assessment Patient in pain? yes     Location: right thumb Intensity: 9 Type: pain Onset of pain  past 3 months Nutritional Status BMI of > 30 = obese Nutritional Status Detail appetite good  Have you ever been in a relationship where you felt threatened, hurt or afraid?denies   Does patient need assistance? Functional Status Self care Ambulation Normal Comments CBG done by pt this AM 200. Hot flashes since age 67. Past 3 months pain right thumb and itching all over past 5 days - bump right thigh area.   Primary Care Provider:  Blondell Reveal MD   History of Present Illness: Pt isa 56 yo female w/ past med hx below here for:  1.  Itching:  Located all over her body, started 5 nights ago.  She hasn't changed any soaps/detergents.   She notes a couple of bumps on her R thigh and back.  Nobody else has a rash in the household.  She saw the dermatologist in 02/11 and she was placed on abx for boils and has f/u with them.  However, she thinks she may not have started the antibtiotic until a couple of weeks ago.  She has taken benadryl without relief.  2.  R thumb pain.  It started hurting about 8 months ago.  There is also a bump around the MCP joint that is tender.  She picks her grandbaby up who weights 50 lbs and this makes her thumb hurt worse.  Thumb gets stuck sometimes in the flexed position.   Depression History:      The patient denies a depressed mood most of the day and a diminished interest in her usual daily activities.         Preventive  Screening-Counseling & Management  Alcohol-Tobacco     Alcohol drinks/day: 0     Smoking Status: current     Smoking Cessation Counseling: yes     Packs/Day: 1.0     Year Started: at the age of 75     Passive Smoke Exposure: yes  Caffeine-Diet-Exercise     Does Patient Exercise: no     Type of exercise: walkiing     Times/week: 2  Current Medications (verified): 1)  Hyzaar 100-25 Mg Tabs (Losartan Potassium-Hctz) .... Take 1 Tablet By Mouth Once A Day 2)  Aspirin 81 Mg Tbec (Aspirin) .... Take 1 Tablet By Mouth Once A Day 3)  Janumet 50-500 Mg Tabs (Sitagliptin-Metformin Hcl) .... Take 1 Tablet By Mouth Two Times A Day 4)  Freestyle Lite Test   Strp (Glucose Blood) .... Use To Test Blood Glucsoe Once Time A Day 5)  Lancets   Misc (Lancets) .... Use To Test Blood Glucose One Time A Day 6)  Freestyle Control Solution   Liqd (Blood Glucose Calibration) .... Use To Calibrate Meter and Assure Quality Readings. 7)  Catapres 0.1 Mg Tabs (Clonidine Hcl) .... Take 2 Tablets By Mouth  At Bedtime 8)  Prodigy No Coding Blood Gluc  Strp (Glucose Blood) .... Use To Check Your Sugar 1-2 Times A Day 9)  Prodigy Blood Glucose Monitor  Devi (Blood Glucose Monitoring Suppl) .... Use To Check Your Blood Sugar 10)  Premarin 0.625 Mg/gm Crea (Estrogens, Conjugated) .... Apply Once Daily For 2 Weeks. Twice Weekly Thereafter 11)  Claritin 10 Mg Tabs (Loratadine) .... Take 1 Tablet By Mouth Once A Day 12)  Cortisporin 3.5-10000-1 Soln (Neomycin-Polymyxin-Hc) .... 4 Drops Three Times A Day Into Affected Ear. 13)  Pravachol 40 Mg Tabs (Pravastatin Sodium) .... Take 1 Tablet By Mouth Once A Day At Bedtime 14)  Fluticasone Propionate 50 Mcg/act Susp (Fluticasone Propionate) .... 2 Spray Inside Each Nostril Daily. 15)  Nexium 40 Mg Cpdr (Esomeprazole Magnesium) .... Take 1 Tablet By Mouth Once A Day  Allergies: 1)  ! Codeine 2)  ! Vicodin 3)  ! Doxycycline Hyclate (Doxycycline Hyclate) 4)  Penicillin V  Potassium (Penicillin V Potassium)  Past History:  Past Medical History: Last updated: 02/28/2009 L sided Bell's palsy 01/2009 Diabetes mellitus, type II Hypertension Headache Tobacco use Hyperlipidemia Allergic rhinitis Hx leg cramps on Lipitor GERD Uterine fibroids Hx chest pain : exercise stress test negative 06-07 Perimenopausal ? Insomnia External hemorrhoids  Past Surgical History: Last updated: 05/11/2009 none  Social History: Last updated: 11/12/2006 Current Smoker  Risk Factors: Smoking Status: current (12/22/2009) Packs/Day: 1.0 (12/22/2009) Passive Smoke Exposure: yes (12/22/2009)  Social History: Reviewed history from 11/12/2006 and no changes required. Current Smoker Packs/Day:  1.0  Review of Systems       as per HPI.   Physical Exam  General:  alert, somewhat tangential, no distress. Eyes:  anicteric. Nose:  erythematous discoloration around nasolabial folds and cheeks.   Lungs:  Normal respiratory effort, chest expands symmetrically. Lungs are clear to auscultation, no crackles or wheezes. Heart:  Normal rate and regular rhythm. S1 and S2 normal without gallop, murmur, click, rub or other extra sounds. Abdomen:  +BS's, soft, NT and ND. Msk:  R thumb without swelling, edema, increased warmth or erythema.  She does some catching at the MCP joint on exam, particularly w/ flxn. Extremities:  no edema. Neurologic:  gait normal. Skin:  Erythematous rash around nasolabial folds, skin dry, papule on R thigh with evidence of purulent, two blackheads on her back.  Psych:  tangential thought, mood euthymic.    Impression & Recommendations:  Problem # 1:  PRURITUS (ICD-698.9) Appears to be a drug rxn on exam.  Per the derm note, they started her on doxy and per our record, doxy is listed as an allergy and the rxn is a very itchy rash, so I suspect this is what has happened.  She thinks she has finished the medication now.  I will write for a few days of  prednisone, H2 blocker, and benadryl.  Will f/u if it persists.  I have reminded her to let people know she has a doxycycline allergy.  Problem # 2:  TRIGGER FINGER, RIGHT THUMB (ICD-727.03) Seems to have trigger finger of the R thumb.  Wants to try tylenol for now.  She does not want to see ortho at this time.  Problem # 3:  PREVENTIVE HEALTH CARE (ICD-V70.0) Reminded her she is due for pap and mammogram.   Complete Medication List: 1)  Hyzaar 100-25 Mg Tabs (Losartan potassium-hctz) .... Take 1 tablet by mouth once a day 2)  Aspirin 81 Mg Tbec (Aspirin) .... Take 1 tablet by  mouth once a day 3)  Janumet 50-500 Mg Tabs (Sitagliptin-metformin hcl) .... Take 1 tablet by mouth two times a day 4)  Freestyle Lite Test Strp (Glucose blood) .... Use to test blood glucsoe once time a day 5)  Lancets Misc (Lancets) .... Use to test blood glucose one time a day 6)  Freestyle Control Solution Liqd (Blood glucose calibration) .... Use to calibrate meter and assure quality readings. 7)  Catapres 0.1 Mg Tabs (Clonidine hcl) .... Take 2 tablets by mouth at bedtime 8)  Prodigy No Coding Blood Gluc Strp (Glucose blood) .... Use to check your sugar 1-2 times a day 9)  Prodigy Blood Glucose Monitor Devi (Blood glucose monitoring suppl) .... Use to check your blood sugar 10)  Premarin 0.625 Mg/gm Crea (Estrogens, conjugated) .... Apply once daily for 2 weeks. twice weekly thereafter 11)  Claritin 10 Mg Tabs (Loratadine) .... Take 1 tablet by mouth once a day 12)  Cortisporin 3.5-10000-1 Soln (Neomycin-polymyxin-hc) .... 4 drops three times a day into affected ear. 13)  Pravachol 40 Mg Tabs (Pravastatin sodium) .... Take 1 tablet by mouth once a day at bedtime 14)  Fluticasone Propionate 50 Mcg/act Susp (Fluticasone propionate) .... 2 spray inside each nostril daily. 15)  Nexium 40 Mg Cpdr (Esomeprazole magnesium) .... Take 1 tablet by mouth once a day 16)  Pepcid 20 Mg Tabs (Famotidine) .... Take 1 tablet by  mouth two times a day 17)  Prednisone 10 Mg Tabs (Prednisone) .... Take 3 tabs by mouth today, then take 2 tabs by mouth tomorrow, then take 1 tab by mouth the next day. 18)  Diphenhydramine Hcl 25 Mg Caps (Diphenhydramine hcl) .... Take 2 tabs by mouth every 6 hours. 19)  Tylenol Extra Strength 500 Mg Tabs (Acetaminophen) .... Take 2 tabs by mouth ever 8 hours as needed for pain.  Patient Instructions: 1)  Please make a followup appointment in 2 months for a checkup. 2)  Call sooner if you need anything. 3)  Please start your new medications below because I think you have had an allergic reaction to doxycycline. 4)  You can take tylenol for your thumb pain.  Prescriptions: TYLENOL EXTRA STRENGTH 500 MG TABS (ACETAMINOPHEN) Take 2 tabs by mouth ever 8 hours as needed for pain.  #1 bottle x prn   Entered and Authorized by:   Joaquin Courts  MD   Signed by:   Joaquin Courts  MD on 12/22/2009   Method used:   Electronically to        [facility] [id number] (retail)       [address] [place], [place]  [address]       Ph: [phone]       Fax: [phone]   RxID:   [id number] DIPHENHYDRAMINE HCL 25 MG CAPS (DIPHENHYDRAMINE HCL) Take 2 tabs by mouth every 6 hours.  #64 x 0   Entered and Authorized by:   Joaquin Courts  MD   Signed by:   Joaquin Courts  MD on 12/22/2009   Method used:   Electronically to        [facility] [id number] (retail)       [address] [place], [place]  [address]       Ph: [phone]       Fax:  [phone]   RxID:   [id number] PREDNISONE 10 MG TABS (PREDNISONE) Take 3 tabs by mouth today, then take 2 tabs by mouth tomorrow, then take 1 tab by mouth the next day.  #6 x 0   Entered and Authorized by:   Joaquin Courts  MD   Signed by:   Joaquin Courts  MD on 12/22/2009   Method used:   Electronically to        [facility] [id number] (retail)       [address] [place], [place]  [address]       Ph: [phone]       Fax: [phone]   RxID:   [id number] PEPCID 20 MG TABS (FAMOTIDINE) Take 1 tablet by mouth two times a day  #30 x 0   Entered and Authorized by:   Joaquin Courts  MD   Signed by:   Joaquin Courts  MD on 12/22/2009   Method used:   Electronically to        [facility] [id number] (retail)       [address]       [place], [place]  [address]       Ph: [phone]       Fax: [phone]   RxID:   [id number]    Prevention & Chronic Care Immunizations   Influenza vaccine: Not documented   Influenza vaccine deferral: Refused  (05/11/2009)    Tetanus booster: 05/18/2009: Td   Td booster deferral: Deferred  (11/16/2009)    Pneumococcal vaccine: Not documented   Pneumococcal vaccine deferral: Deferred  (11/16/2009)  Colorectal Screening   Hemoccult: Negative  (12/25/2005)    Colonoscopy: One 4 mm polyp in the proximal ascending colon, resected and retrieved.  Two 3-5 mm polyps in the proximal ascending colon, resected and retrieved. Pathology results pending. Exam done by Dr.Vincent Schooler.  (03/22/2008)   Colonoscopy action/deferral: Repeat colonoscopy in 1-3 months.    (01/06/2008)   Colonoscopy due: 03/23/2011  Other Screening   Pap smear: NEGATIVE FOR INTRAEPITHELIAL LESIONS OR MALIGNANCY.  (02/28/2009)   Pap smear due: 02/29/2012    Mammogram: No specific mammographic evidence of malignancy.  Assessment: BIRADS 1.   (03/04/2008)   Mammogram action/deferral: Ordered  (05/11/2009)   Mammogram due: 03/2009   Smoking status: current  (12/22/2009)   Smoking cessation counseling: yes  (12/22/2009)  Diabetes Mellitus   HgbA1C: 6.2  (11/16/2009)    Eye exam: No diabetic retinopathy.     (02/08/2009)   Eye exam due: 02/08/2010    Foot exam: yes  (08/31/2008)   Foot exam action/deferral: Deferred   High risk foot: No  (06/28/2008)   Foot care education: Done   (05/11/2009)    Urine microalbumin/creatinine ratio: 6.8  (06/28/2008)   Urine microalbumin action/deferral: Deferred   Urine microalbumin/cr due: 06/28/2009    Diabetes flowsheet reviewed?: Yes   Progress toward A1C goal: At goal  Lipids   Total Cholesterol: 236  (11/17/2009)   Lipid panel action/deferral: Lipid Panel ordered   LDL: 129  (11/17/2009)   LDL Direct: Not documented   HDL: 50  (11/17/2009)   Triglycerides: 284  (11/17/2009)   Lipid panel due: 01/05/2010    SGOT (AST): 17  (11/17/2009)   BMP action: Ordered   SGPT (ALT): 19  (11/17/2009)   Alkaline phosphatase: 70  (  11/17/2009)   Total bilirubin: 0.3  (11/17/2009)    Lipid flowsheet reviewed?: Yes   Progress toward LDL goal: Unchanged  Hypertension   Last Blood Pressure: 130 / 82  (12/22/2009)   Serum creatinine: 0.77  (11/17/2009)   BMP action: Ordered   Serum potassium 4.2  (11/17/2009)   Basic metabolic panel due: 01/05/2010    Hypertension flowsheet reviewed?: Yes   Progress toward BP goal: At goal  Self-Management Support :   Personal Goals (by the next clinic visit) :     Personal A1C goal: 6  (11/16/2009)     Personal blood pressure goal: 130/80  (05/11/2009)     Personal LDL goal: 70  (11/16/2009)    Patient will work on the following items until the next clinic visit to reach self-care goals:     Medications and monitoring: take my medicines every day, check my blood sugar, examine my feet every day  (12/22/2009)     Eating: drink diet soda or water instead of juice or soda, eat more vegetables, use fresh or frozen vegetables, eat baked foods instead of fried foods, eat fruit for snacks and desserts, limit or avoid alcohol  (12/22/2009)     Activity: take a 30 minute walk every day, take the stairs instead of the elevator, park at the far end of the parking lot  (12/22/2009)     Home glucose monitoring frequency: 1 time daily  (11/16/2009)    Diabetes self-management support: Written self-care  plan, Education handout, Resources for patients handout  (12/22/2009)   Diabetes care plan printed   Diabetes education handout printed   Last diabetes self-management training by diabetes educator: 01/05/2009    Hypertension self-management support: Written self-care plan, Education handout, Resources for patients handout  (12/22/2009)   Hypertension self-care plan printed.   Hypertension education handout printed    Lipid self-management support: Written self-care plan, Education handout, Resources for patients handout  (12/22/2009)   Lipid self-care plan printed.   Lipid education handout printed      Resource handout printed.

## 2010-11-07 NOTE — Assessment & Plan Note (Signed)
Summary: ACUTE/BOGGALA/2 WEEK RECHECK PER VEGA/CH   Vital Signs:  Patient profile:   55 year old female Height:      65 inches (165.10 cm) Weight:      185.1 pounds (84.14 kg) BMI:     30.91 Temp:     97.1 degrees F (36.17 degrees C) oral Pulse rate:   95 / minute BP sitting:   134 / 88  (right arm)  Vitals Entered By: Stanton Kidney Ditzler RN (August 11, 2010 1:48 PM) Is Patient Diabetic? Yes Did you bring your meter with you today? No Pain Assessment Patient in pain? yes     Location: both ears and hands Intensity: 8 Type: sharp Onset of pain  past month Nutritional Status BMI of > 30 = obese Nutritional Status Detail appetite good CBG Result 149  Have you ever been in a relationship where you felt threatened, hurt or afraid?denies   Does patient need assistance? Functional Status Self care Ambulation Normal Comments FU and discuss pain in both ears and hands for past month.   Primary Care Provider:  Blondell Reveal MD   History of Present Illness: 55 yr old AAF with PMH of DM, HTN, and HLD comes to the clinic for f/u for her bilateral hand pain and numbness. It started one month ago, no trauma. She was gievn neurontinin and tylenol about 2 weeks ago, seem no much improvement, cervical X-ray shows DJD and spondylosis. She has no other c/o, including fever, CP, SOB, execept both ear pain, w/o drainage. Current smoker 1/2 PPD, denies ETOH or drugs.  Depression History:      The patient denies a depressed mood most of the day and a diminished interest in her usual daily activities.         Preventive Screening-Counseling & Management  Alcohol-Tobacco     Alcohol drinks/day: 0     Smoking Status: current     Smoking Cessation Counseling: yes     Packs/Day: 1/2 ppd     Year Started: at the age of 28     Passive Smoke Exposure: yes  Caffeine-Diet-Exercise     Does Patient Exercise: no     Type of exercise: walkiing     Times/week: 2  Problems Prior to Update: 1)  Ear  Pain, Right  (ICD-388.70) 2)  Headache  (ICD-784.0) 3)  Hot Flashes  (ICD-627.2) 4)  Osteoarthritis, Carpometacarpal Joint, Right Thumb  (ICD-715.94) 5)  Trigger Finger, Right Thumb  (ICD-727.03) 6)  Sebaceous Cyst, Breast  (ICD-610.8) 7)  Preventive Health Care  (ICD-V70.0) 8)  Cigarette Smoker  (ICD-305.1) 9)  Insomnia  (ICD-780.52) 10)  Bell's Palsy, Left  (ICD-351.0) 11)  Allergic Rhinitis  (ICD-477.9) 12)  Adenomatous Colonic Polyp  (ICD-211.3) 13)  Diabetes Mellitus, Type II  (ICD-250.00) 14)  Hypertension  (ICD-401.9) 15)  Hypertensive Retinopathy  (ICD-362.11) 16)  Dyslipidemia  (ICD-272.4) 17)  Pruritus  (ICD-698.9) 18)  Diabetic Peripheral Neuropathy  (ICD-250.60) 19)  Health Maintenance Exam  (ICD-V70.0) 20)  Hx of Leg Cramps  (ICD-729.82) 21)  Fibroids, Uterus  (ICD-218.9) 22)  Perimenopausal Status  (ICD-627.2) 23)  External Hemorrhoids  (ICD-455.3)  Medications Prior to Update: 1)  Hyzaar 100-25 Mg Tabs (Losartan Potassium-Hctz) .... Take 1 Tablet By Mouth Once A Day 2)  Aspirin 81 Mg Tbec (Aspirin) .... Take 1 Tablet By Mouth Once A Day 3)  Janumet 50-500 Mg Tabs (Sitagliptin-Metformin Hcl) .... Take 1 Tablet By Mouth Two Times A Day 4)  Freestyle Lite Test  Strp (Glucose Blood) .... Use To Test Blood Glucsoe Once Time A Day 5)  Lancets   Misc (Lancets) .... Use To Test Blood Glucose One Time A Day 6)  Freestyle Control Solution   Liqd (Blood Glucose Calibration) .... Use To Calibrate Meter and Assure Quality Readings. 7)  Prodigy No Coding Blood Gluc  Strp (Glucose Blood) .... Use To Check Your Sugar 1-2 Times A Day 8)  Prodigy Blood Glucose Monitor  Devi (Blood Glucose Monitoring Suppl) .... Use To Check Your Blood Sugar 9)  Cortisporin 3.5-10000-1 Soln (Neomycin-Polymyxin-Hc) .... 4 Drops Three Times A Day Into Affected Ear. 10)  Pravachol 40 Mg Tabs (Pravastatin Sodium) .... Take 2 Tablets By Mouth Once A Day At Bedtime 11)  Tylenol Extra Strength 500 Mg Tabs  (Acetaminophen) .... Take 2 Tabs By Mouth Ever 8 Hours As Needed For Pain. 12)  Amitriptyline Hcl 50 Mg Tabs (Amitriptyline Hcl) .... Take 1 Tab By Mouth At Bedtime 13)  Neurontin 300 Mg Caps (Gabapentin) .... Take 1 Tablet By Mouth At Bedtime Day 1, Then Take 1 Tablet By Mouth Two Times A Day On Day 2, Then Three Times A Day  Current Medications (verified): 1)  Hyzaar 100-25 Mg Tabs (Losartan Potassium-Hctz) .... Take 1 Tablet By Mouth Once A Day 2)  Aspirin 81 Mg Tbec (Aspirin) .... Take 1 Tablet By Mouth Once A Day 3)  Janumet 50-500 Mg Tabs (Sitagliptin-Metformin Hcl) .... Take 1 Tablet By Mouth Two Times A Day 4)  Freestyle Lite Test   Strp (Glucose Blood) .... Use To Test Blood Glucsoe Once Time A Day 5)  Lancets   Misc (Lancets) .... Use To Test Blood Glucose One Time A Day 6)  Freestyle Control Solution   Liqd (Blood Glucose Calibration) .... Use To Calibrate Meter and Assure Quality Readings. 7)  Prodigy No Coding Blood Gluc  Strp (Glucose Blood) .... Use To Check Your Sugar 1-2 Times A Day 8)  Prodigy Blood Glucose Monitor  Devi (Blood Glucose Monitoring Suppl) .... Use To Check Your Blood Sugar 9)  Cortisporin 3.5-10000-1 Soln (Neomycin-Polymyxin-Hc) .... 4 Drops Three Times A Day Into Affected Ear. 10)  Pravachol 40 Mg Tabs (Pravastatin Sodium) .... Take 2 Tablets By Mouth Once A Day At Bedtime 11)  Tylenol Extra Strength 500 Mg Tabs (Acetaminophen) .... Take 2 Tabs By Mouth Ever 8 Hours As Needed For Pain. 12)  Amitriptyline Hcl 50 Mg Tabs (Amitriptyline Hcl) .... Take 1 Tab By Mouth At Bedtime 13)  Neurontin 300 Mg Caps (Gabapentin) .... Take 1 Tablet By Mouth At Bedtime Day 1, Then Take 1 Tablet By Mouth Two Times A Day On Day 2, Then Three Times A Day  Allergies: 1)  ! Codeine 2)  ! Vicodin 3)  ! Doxycycline Hyclate (Doxycycline Hyclate) 4)  Penicillin V Potassium (Penicillin V Potassium)  Past History:  Past Medical History: Last updated: 06/01/2010 L sided Bell's palsy  01/2009 Diabetes mellitus, type II Hypertension Headache Tobacco use Hyperlipidemia Allergic rhinitis Hx leg cramps on Lipitor GERD Uterine fibroids Hx chest pain : exercise stress test negative 06-07 Perimenopausal symptoms - hot flashes, vaginal dryness, irritability, difficulty sleeping since LMP in 2005 Insomnia External hemorrhoids  Family History: Last updated: 20-Sep-2008 M: died with bad cough 1969 @ 57yo, not sure about health history S: died with bad cough 1996 @ 57yo, of pneumonia  Social History: Last updated: 11/12/2006 Current Smoker  Risk Factors: Smoking Status: current (08/11/2010) Packs/Day: 1/2 ppd (08/11/2010) Passive Smoke Exposure: yes (08/11/2010)  Family History: Reviewed history from 08/31/2008 and no changes required. M: died with bad cough 1969 @ 57yo, not sure about health history S: died with bad cough 1996 @ 55yo, of pneumonia  Social History: Reviewed history from 11/12/2006 and no changes required. Current Smoker  Review of Systems  The patient denies fever, decreased hearing, chest pain, syncope, dyspnea on exertion, peripheral edema, prolonged cough, hemoptysis, abdominal pain, melena, and hematochezia.    Physical Exam  General:  alert, well-developed, well-nourished, well-hydrated, and overweight-appearing.   Ears:  R ear normal and R pinna tender.   Nose:  no nasal discharge.   Mouth:  pharynx pink and moist.   Neck:  supple.   Lungs:  normal respiratory effort, normal breath sounds, no crackles, and no wheezes.   Heart:  normal rate, regular rhythm, no murmur, no JVD, and no HJR.   Abdomen:  soft, non-tender, normal bowel sounds, no distention, and no guarding.   Msk:  normal ROM, no joint swelling, no joint warmth. mild  PIP joint tenderness, and enlarged PIP joints in both hand.   Pulses:  2+ Extremities:  No edema.  Neurologic:  alert & oriented X3, cranial nerves II-XII intact, strength normal in all extremities, sensation  intact to light touch, gait normal, and DTRs symmetrical and normal.     Impression & Recommendations:  Problem # 1:  OSTEOARTHRITIS, CARPOMETACARPAL JOINT, RIGHT THUMB (ICD-715.94) Assessment Unchanged Her hand pain is likely due to OA. Will add ibuprofen as needed for this.  Her hand numbness may also be due to diabetic neuropathy and cervical radiculopathy, will continue her other meds including neurontin. Will recheck at next office.  Her updated medication list for this problem includes:    Aspirin 81 Mg Tbec (Aspirin) .Marland Kitchen... Take 1 tablet by mouth once a day    Tylenol Extra Strength 500 Mg Tabs (Acetaminophen) .Marland Kitchen... Take 2 tabs by mouth ever 8 hours as needed for pain.    Ibuprofen 400 Mg Tabs (Ibuprofen) .Marland Kitchen... Take one (1) tablet by mouth three (3) times a day with food as needed for pain  Discussed use of medications, application of heat or cold, and exercises.   Problem # 2:  DIABETES MELLITUS, TYPE II (ICD-250.00) Assessment: Unchanged Well controlled and will continue current regimen.  Her updated medication list for this problem includes:    Hyzaar 100-25 Mg Tabs (Losartan potassium-hctz) .Marland Kitchen... Take 1 tablet by mouth once a day    Aspirin 81 Mg Tbec (Aspirin) .Marland Kitchen... Take 1 tablet by mouth once a day    Janumet 50-500 Mg Tabs (Sitagliptin-metformin hcl) .Marland Kitchen... Take 1 tablet by mouth two times a day  Orders: Capillary Blood Glucose/CBG (60454) Ophthalmology Referral (Ophthalmology)  Problem # 3:  HYPERTENSION (ICD-401.9) Assessment: Improved BP well controlled. No change to her meds for now.  Her updated medication list for this problem includes:    Hyzaar 100-25 Mg Tabs (Losartan potassium-hctz) .Marland Kitchen... Take 1 tablet by mouth once a day  BP today: 134/88 Prior BP: 148/90 (07/28/2010)  Labs Reviewed: K+: 4.2 (11/17/2009) Creat: : 0.77 (11/17/2009)   Chol: 236 (11/17/2009)   HDL: 50 (11/17/2009)   LDL: 129 (11/17/2009)   TG: 284 (11/17/2009)  Her updated medication list  for this problem includes:    Hyzaar 100-25 Mg Tabs (Losartan potassium-hctz) .Marland Kitchen... Take 1 tablet by mouth once a day  Problem # 4:  EAR PAIN, RIGHT (ICD-388.70) Assessment: Unchanged Her ear exam is normal, assured her. No meds needed.  Her updated  medication list for this problem includes:    Cortisporin 3.5-10000-1 Soln (Neomycin-polymyxin-hc) .Marland KitchenMarland KitchenMarland KitchenMarland Kitchen 4 drops three times a day into affected ear.  Problem # 5:  CIGARETTE SMOKER (ICD-305.1) Assessment: Comment Only Current smoker. Encouraged smoking cessation and discussed different methods for smoking cessation.   Complete Medication List: 1)  Hyzaar 100-25 Mg Tabs (Losartan potassium-hctz) .... Take 1 tablet by mouth once a day 2)  Aspirin 81 Mg Tbec (Aspirin) .... Take 1 tablet by mouth once a day 3)  Janumet 50-500 Mg Tabs (Sitagliptin-metformin hcl) .... Take 1 tablet by mouth two times a day 4)  Freestyle Lite Test Strp (Glucose blood) .... Use to test blood glucsoe once time a day 5)  Lancets Misc (Lancets) .... Use to test blood glucose one time a day 6)  Freestyle Control Solution Liqd (Blood glucose calibration) .... Use to calibrate meter and assure quality readings. 7)  Prodigy No Coding Blood Gluc Strp (Glucose blood) .... Use to check your sugar 1-2 times a day 8)  Prodigy Blood Glucose Monitor Devi (Blood glucose monitoring suppl) .... Use to check your blood sugar 9)  Cortisporin 3.5-10000-1 Soln (Neomycin-polymyxin-hc) .... 4 drops three times a day into affected ear. 10)  Pravachol 40 Mg Tabs (Pravastatin sodium) .... Take 2 tablets by mouth once a day at bedtime 11)  Tylenol Extra Strength 500 Mg Tabs (Acetaminophen) .... Take 2 tabs by mouth ever 8 hours as needed for pain. 12)  Amitriptyline Hcl 50 Mg Tabs (Amitriptyline hcl) .... Take 1 tab by mouth at bedtime 13)  Neurontin 300 Mg Caps (Gabapentin) .... Take 1 tablet by mouth at bedtime day 1, then take 1 tablet by mouth two times a day on day 2, then three times a  day 14)  Ibuprofen 400 Mg Tabs (Ibuprofen) .... Take one (1) tablet by mouth three (3) times a day with food as needed for pain  Patient Instructions: 1)  Please schedule a follow-up appointment in 3-4 months. 2)  Tobacco is very bad for your health and your loved ones! You Should stop smoking!. 3)  Stop Smoking Tips: Choose a Quit date. Cut down before the Quit date. decide what you will do as a substitute when you feel the urge to smoke(gum,toothpick,exercise). 4)  It is important that you exercise regularly at least 20 minutes 5 times a week. If you develop chest pain, have severe difficulty breathing, or feel very tired , stop exercising immediately and seek medical attention. 5)  Check your blood sugars regularly. If your readings are usually above : or below 70 you should contact our office. 6)  Check your feet each night for sore areas, calluses or signs of infection. Prescriptions: IBUPROFEN 400 MG TABS (IBUPROFEN) Take one (1) tablet by mouth three (3) times a day with food as needed for pain  #60 x 0   Entered and Authorized by:   Jackson Latino MD   Signed by:   Jackson Latino MD on 08/11/2010   Method used:   Electronically to        [facility] [id number] (retail)       [address] [place], [place]  [address]       Ph: [phone]       Fax: [phone]   RxID:   [id number]    Orders Added: 1)  Capillary Blood Glucose/CBG [84132] 2)  Est. Patient Level IV [44010]  3)  Ophthalmology Referral [Ophthalmology]     Prevention & Chronic Care Immunizations   Influenza vaccine: Not documented   Influenza vaccine deferral: Refused  (08/11/2010)    Tetanus booster: 05/18/2009: Td   Td booster deferral: Not indicated  (05/01/2010)    Pneumococcal vaccine: Not documented   Pneumococcal vaccine deferral: Not indicated  (05/01/2010)  Colorectal Screening   Hemoccult: Negative  (12/25/2005)   Hemoccult action/deferral: Not indicated   (05/01/2010)    Colonoscopy: One 4 mm polyp in the proximal ascending colon, resected and retrieved.  Two 3-5 mm polyps in the proximal ascending colon, resected and retrieved. Pathology results pending. Exam done by Dr.Vincent Schooler.  (03/22/2008)   Colonoscopy action/deferral: Not indicated  (05/01/2010)   Colonoscopy due: 03/23/2011  Other Screening   Pap smear: NEGATIVE FOR INTRAEPITHELIAL LESIONS OR MALIGNANCY.  (02/28/2009)   Pap smear action/deferral: Not indicated-other  (05/01/2010)   Pap smear due: 02/29/2012    Mammogram: No specific mammographic evidence of malignancy.  Assessment: BIRADS 1.   (03/04/2008)   Mammogram action/deferral: Refused  (05/01/2010)   Mammogram due: 03/2009   Smoking status: current  (08/11/2010)   Smoking cessation counseling: yes  (08/11/2010)  Diabetes Mellitus   HgbA1C: 6.2  (07/28/2010)   HgbA1C action/deferral: Ordered  (05/01/2010)    Eye exam: No diabetic retinopathy.     (02/08/2009)   Diabetic eye exam action/deferral: Ophthalmology referral  (08/11/2010)   Eye exam due: 02/08/2010    Foot exam: yes  (05/01/2010)   Foot exam action/deferral: Do today   High risk foot: Yes  (08/11/2010)   Foot care education: Done  (05/01/2010)    Urine microalbumin/creatinine ratio: 6.8  (06/28/2008)   Urine microalbumin action/deferral: Deferred   Urine microalbumin/cr due: 06/28/2009    Diabetes flowsheet reviewed?: Yes   Progress toward A1C goal: At goal  Lipids   Total Cholesterol: 236  (11/17/2009)   Lipid panel action/deferral: Lipid Panel ordered   LDL: 129  (11/17/2009)   LDL Direct: Not documented   HDL: 50  (11/17/2009)   Triglycerides: 284  (11/17/2009)   Lipid panel due: 01/05/2010    SGOT (AST): 17  (11/17/2009)   BMP action: Ordered   SGPT (ALT): 19  (11/17/2009)   Alkaline phosphatase: 70  (11/17/2009)   Total bilirubin: 0.3  (11/17/2009)    Lipid flowsheet reviewed?: Yes   Progress toward LDL goal:  Improved  Hypertension   Last Blood Pressure: 134 / 88  (08/11/2010)   Serum creatinine: 0.77  (11/17/2009)   BMP action: Ordered   Serum potassium 4.2  (11/17/2009)   Basic metabolic panel due: 01/05/2010    Hypertension flowsheet reviewed?: Yes   Progress toward BP goal: Improved  Self-Management Support :   Personal Goals (by the next clinic visit) :     Personal A1C goal: 6  (11/16/2009)     Personal blood pressure goal: 130/80  (05/11/2009)     Personal LDL goal: 70  (11/16/2009)    Patient will work on the following items until the next clinic visit to reach self-care goals:     Medications and monitoring: take my medicines every day, check my blood sugar, bring all of my medications to every visit, examine my feet every day  (08/11/2010)     Eating: drink diet soda or water instead of juice or soda, eat more vegetables, use fresh or frozen vegetables, eat fruit for snacks and desserts, limit or avoid alcohol  (08/11/2010)     Activity: take  a 30 minute walk every day, park at the far end of the parking lot  (08/11/2010)     Other: walk every night  (05/01/2010)     Home glucose monitoring frequency: 1 time daily  (11/16/2009)    Diabetes self-management support: Written self-care plan, Education handout, Resources for patients handout  (08/11/2010)   Diabetes care plan printed   Diabetes education handout printed   Last diabetes self-management training by diabetes educator: 01/05/2009    Hypertension self-management support: Written self-care plan, Education handout, Resources for patients handout  (08/11/2010)   Hypertension self-care plan printed.   Hypertension education handout printed    Lipid self-management support: Written self-care plan, Education handout, Resources for patients handout  (08/11/2010)   Lipid self-care plan printed.   Lipid education handout printed      Resource handout printed.   Nursing Instructions: Refer for screening diabetic eye exam  (see order)   Last LDL:                                                 129 (11/17/2009 12:03:00 AM)      Diabetic Foot Exam Last Podiatry Exam Date: 08/31/2008 Comments: Pt refuses to do foot exam - Dr Threasa Beards aware. Debra Ditzler RN High Risk Feet? Yes

## 2010-11-07 NOTE — Assessment & Plan Note (Signed)
Summary: ACUTE-  ER-FUTO REF PT TO ENT PER  PT/CFB   Vital Signs:  Patient Profile:   55 Years Old Female Height:     67 inches (170.18 cm) Weight:      192.4 pounds (87.45 kg) BMI:     30.24 Temp:     97.4 degrees F (36.33 degrees C) oral Pulse rate:   79 / minute BP sitting:   162 / 93  (right arm) Cuff size:   large  Pt. in pain?   yes    Location:   EAR/FOOT-L    Intensity:   10+    Type:       numbness  Vitals Entered By: Theotis Barrio (July 02, 2007 10:18 AM)              Is Patient Diabetic? Yes  Nutritional Status NORMAL CBG Result 130  Does patient need assistance? Functional Status Self care Ambulation Normal     PCP:  Dellia Beckwith MD  Chief Complaint:  WENT TO ER/ NEED REFERRAL TO EAR /FOOT SPECIALITY.  History of Present Illness: Bridget Owens is here today with a sensation of bilateral ear fullness. This began about 1 week ago. No real ear pain, fever,chills. She also still has the numbness and tingling on her feet and could not tolerate the neurontin 2/2 headache and extreme drowsiness.  Current Allergies: ! PCN ! CODEINE ! VICODIN    Risk Factors: Tobacco use:  current    Year started:  at the age of 32    Cigarettes:  Yes -- 1/2 pack(s) per day Alcohol use:  no Exercise:  yes    Times per week:  2    Type:  walkiing Seatbelt use:  100 %  Mammogram History:    Date of Last Mammogram:  01/02/2006  PAP Smear History:    Date of Last PAP Smear:  01/07/2004   Review of Systems  The patient denies fever, weight loss, chest pain, dyspnea on exhertion, abdominal pain, melena, hematochezia, and severe indigestion/heartburn.     Physical Exam  General:     Well-developed,well-nourished,in no acute distress; alert,appropriate and cooperative throughout examination Ears:     Bilateral cerumen impactation. Can not visualize tympanic membrane. Lungs:     Normal respiratory effort, chest expands symmetrically. Lungs are clear to  auscultation, no crackles or wheezes. Heart:     Normal rate and regular rhythm. S1 and S2 normal without gallop, murmur, click, rub or other extra sounds. Extremities:     Hard calluses over heels. Decreased pinprick sensation.    Impression & Recommendations:  Problem # 1:  EAR PAIN, BILATERAL (ICD-388.70) Not pain, just "fullness". She has bilateral cerumen impactation. Have asked her to use Debrox 3 drops in each ear 3 times a day and on next visit we will attempt to remove some of the wax.  Her updated medication list for this problem includes:    E.e.s. 400 400 Mg Tabs (Erythromycin ethylsuccinate) .Marland Kitchen... Take 1 tablet by mouth four times a day   Problem # 2:  DIABETIC PERIPHERAL NEUROPATHY (ICD-250.60) Have given her a prescription for lyrica. Referral to podiatry for shaving of calluses.  Her updated medication list for this problem includes:    Cozaar 100 Mg Tabs (Losartan potassium) .Marland Kitchen... Take 1 tablet by mouth once a day    Aspirin 81 Mg Tbec (Aspirin) .Marland Kitchen... Take 1 tablet by mouth once a day    Metformin Hcl 500 Mg Tabs (Metformin hcl) .Marland Kitchen... Take  1 tablet by mouth two times a day  Future Orders: Podiatry Referral (Podiatry) ... 07/15/2007   Problem # 3:  HYPERTENSION (ICD-401.9) BP elevated today, but she has not taken her meds today and has been out of HCTZ for a few days. Have provided samples from the clinic as well as a new prescription.  Her updated medication list for this problem includes:    Hydrochlorothiazide 25 Mg Tabs (Hydrochlorothiazide) .Marland Kitchen... Take 1 tablet by mouth once a day    Cozaar 100 Mg Tabs (Losartan potassium) .Marland Kitchen... Take 1 tablet by mouth once a day  BP today: 162/93 Prior BP: 140/86 (06/17/2007)  Labs Reviewed: Creat: 0.73 (03/18/2007) Chol: 222 (12/25/2005)   HDL: 56 (12/25/2005)   LDL: 150 (12/25/2005)   TG: 78 (12/25/2005)   Complete Medication List: 1)  Hydrochlorothiazide 25 Mg Tabs (Hydrochlorothiazide) .... Take 1 tablet by mouth  once a day 2)  Cozaar 100 Mg Tabs (Losartan potassium) .... Take 1 tablet by mouth once a day 3)  Aspirin 81 Mg Tbec (Aspirin) .... Take 1 tablet by mouth once a day 4)  Metformin Hcl 500 Mg Tabs (Metformin hcl) .... Take 1 tablet by mouth two times a day 5)  Nicoderm Cq 14 Mg/24hr Pt24 (Nicotine) .... Apply one patch daily 6)  Tylenol 8 Hour 650 Mg Tbcr (Acetaminophen) .... Take 1 tablet by mouth three times a day 7)  Cyclobenzaprine Hcl 10 Mg Tabs (Cyclobenzaprine hcl) .... Take 1 tablet by mouth every 8 hours as needed for muscle spasm 8)  Premarin 0.625 Mg/gm Crea (Estrogens, conjugated) .... Apply 0.5 mg intravaginally daily 9)  E.e.s. 400 400 Mg Tabs (Erythromycin ethylsuccinate) .... Take 1 tablet by mouth four times a day 10)  Protonix 40 Mg Pack (Pantoprazole sodium) .... Take one tablet by mouth daily 11)  Lyrica 75 Mg Caps (Pregabalin) .... Take 1 tablet by mouth once a day   Patient Instructions: 1)  Please schedule a follow-up appointment in 1 month with Dr. Lorel Monaco. 2)  Take the lyrica 75 mg 1 pill a dayfor the numbness on your feet.    Prescriptions: LYRICA 75 MG  CAPS (PREGABALIN) Take 1 tablet by mouth once a day  #31 x 3   Entered and Authorized by:   Peggye Pitt MD   Signed by:   Peggye Pitt MD on 07/02/2007   Method used:   Electronically sent to ...       [facility] [address]       [address] [place], [place]  [address]       Ph: [phone]       Fax: [phone]   RxID:   [id number] HYDROCHLOROTHIAZIDE 25 MG TABS (HYDROCHLOROTHIAZIDE) Take 1 tablet by mouth once a day  #30 x 5   Entered and Authorized by:   Peggye Pitt MD   Signed by:   Peggye Pitt MD on 07/02/2007   Method used:   Electronically sent to ...       [facility] [address]       [address] [place], [place]  [address]       Ph: [phone]       Fax: [phone]   RxID:   [id number]  ]

## 2010-11-07 NOTE — Letter (Signed)
Summary: ABC Sheet  ABC Sheet   Imported By: Florinda Marker 03/21/2009 15:55:05  _____________________________________________________________________  External Attachment:    Type:   Image     Comment:   External Document

## 2010-11-07 NOTE — Progress Notes (Signed)
Summary: Pravachol  Phone Note Call from Patient   Caller: Patient Call For: Blondell Reveal MD Summary of Call: Call from pt says that Pravachol  is giving her headaches.  Pt is taking the medication at night and the headaches are in the morning.  Pt says that she stopped the medication a week ago and the headaches have stopped. Angelina Ok RN  August 16, 2009 10:22 AM  Initial call taken by: Angelina Ok RN,  August 16, 2009 10:22 AM  Follow-up for Phone Call        Please ask her to restart taking them after a week and see if the headaches return. If they return again, ask her to stop them. Follow-up by: Blondell Reveal MD,  August 17, 2009 2:09 PM     Appended Document: Pravachol RTC to pt given message to restart her Pravachol after being off for 1 week.  If the headaches return pt is to stopp the Pravachol per Dr. Comer Locket.  Pt voiced understanding of plan and was asked to call the Clinics if the headaches return.  Angelina Ok, RN Novemver 15, 2010 10:35 AM

## 2010-11-07 NOTE — Progress Notes (Signed)
Summary: refill/ hla  Phone Note Refill Request Message from:  Patient on May 23, 2010 4:01 PM  Refills Requested: Medication #1:  diflucan 150mg    Dosage confirmed as above?Dosage Confirmed pt states she is itching "down there"  Initial call taken by: Marin Roberts RN,  May 23, 2010 4:02 PM  Follow-up for Phone Call        Needs to be seen before considering the treatment. Follow-up by: Blondell Reveal MD,  May 30, 2010 11:15 AM     Appended Document: refill/ hla appt scheduled

## 2010-11-07 NOTE — Assessment & Plan Note (Signed)
Summary: acute-earache/cfb(boggala/cfb   Vital Signs:  Patient profile:   55 year old female Height:      67 inches (170.18 cm) Weight:      183.3 pounds (83.32 kg) BMI:     28.81 O2 Sat:      100 % Temp:     97.3 degrees F (36.28 degrees C) oral Pulse rate:   78 / minute BP sitting:   124 / 78  (right arm)  Vitals Entered By: Stanton Kidney Ditzler RN (January 05, 2009 10:38 AM) Is Patient Diabetic? Yes  Pain Assessment Patient in pain? yes     Location: right ear and throat Intensity: 7 Onset of pain  Mowed yard 12/30/08 Nutritional Status BMI of 25 - 29 = overweight Nutritional Status Detail appetite good  Have you ever been in a relationship where you felt threatened, hurt or afraid?denies   Does patient need assistance? Functional Status Self care Ambulation Normal Comments Mowed yard 12/30/08 - right ear hurts and sore throat. Productive cough - color ?Marland Kitchen   Primary Care Provider:  Blondell Reveal MD   History of Present Illness: Bridget Owens is a 55 y/o with Diabetes mellitus, type II Hypertension Headache Tobacco use Hyperlipidemia Allergic rhinitis Hx leg cramps on Lipitor GERD Uterine fibroids Hx chest pain : exercise stress test negative 06-07 Perimenopausal Insomnia External hemorrhoids Menopause Atrophic vaginitis (04/22/2007) TB exposure 2010 with negative AFB culture, and thus d/c from TB monitoring Concerning her seasonal allergies, they were okay until about 2 weeks ago. The first symptom was ear fullness, and throat hurting. THe pain in the throat is a burning pain, sometimes worse when swallows, and improved with salt water gurgle. She states whenever she goes to [employer], she gets similar symptoms. She has been having watery eyes, runny nose, sneezing, and she is coughing with productive phlegm. She also is having post nasal drip. Her hearing is not affected, she is not having fevers or chills, but is having hot flashes. The clonidine helps a bit with the hot  flashes.  Concerning her DM, she is taking the medications and is not missing doses as per her. Her glucometer broke. She needs a new meter.  Concerning her HTN, she has been taking her medication. She has not been getting light headed. She is still having hot flashes, and that is affecting her sleep.  Preventive Screening-Counseling & Management     Smoking Status: current     Packs/Day: 0.5  Current Medications (verified): 1)  Hyzaar 100-25 Mg Tabs (Losartan Potassium-Hctz) .... Take 1 Tablet By Mouth Once A Day 2)  Aspirin 81 Mg Tbec (Aspirin) .... Take 1 Tablet By Mouth Once A Day 3)  Janumet 50-500 Mg Tabs (Sitagliptin-Metformin Hcl) .... Take 1 Tablet By Mouth Two Times A Day 4)  Tylenol 8 Hour 650 Mg  Tbcr (Acetaminophen) .... Take 1 Tablet By Mouth Three Times A Day 5)  Caduet 5-20 Mg Tabs (Amlodipine-Atorvastatin) .... Take 1 Tablet By Mouth Once A Day 6)  Freestyle Lite Test   Strp (Glucose Blood) .... Use To Test Blood Glucsoe Once Time A Day 7)  Lancets   Misc (Lancets) .... Use To Test Blood Glucose One Time A Day 8)  Freestyle Control Solution   Liqd (Blood Glucose Calibration) .... Use To Calibrate Meter and Assure Quality Readings. 9)  Catapres 0.1 Mg Tabs (Clonidine Hcl) .... Take 1 Tab By Mouth At Bedtime  Allergies: 1)  ! Codeine 2)  ! Vicodin 3)  ! Doxycycline Hyclate (  Doxycycline Hyclate)  Past History:  Past medical, surgical, family and social histories (including risk factors) reviewed, and no changes noted (except as noted below).  Past Medical History:    Reviewed history from 04/22/2007 and no changes required:    Diabetes mellitus, type II    Hypertension    Headache    Tobacco use    Hyperlipidemia    Allergic rhinitis    Hx leg cramps on Lipitor    GERD    Uterine fibroids    Hx chest pain : exercise stress test negative 06-07    Perimenopausal    Insomnia    External hemorrhoids    Menopause    Atrophic vaginitis  Family History:     Reviewed history from 08/31/2008 and no changes required:       M: died with bad cough 1969 @ 57yo, not sure about health history       S: died with bad cough 1996 @ 55yo, of pneumonia  Social History:    Reviewed history from 11/12/2006 and no changes required:       Current Smoker    Packs/Day:  0.5  Review of Systems      See HPI  Physical Exam  General:  Alert, no distress.  Head:  Normocephalic and atraumatic without obvious abnormalities. No apparent alopecia or balding. Eyes:  anicteric. Ears:  no external deformities.  R ear normal and L ear normal.   Nose:  swollen bilateral boggy nasal turbinates with moderate amount of mucus Mouth:  Oral mucosa and oropharynx without lesions or exudates.  Teeth in good repair. Lungs:  normal respiratory effort, normal breath sounds, no crackles, and no wheezes.   Heart:  normal rate and regular rhythm, Abdomen:  +BS's, soft, NT and ND. Neurologic:  alert & oriented X3 and gait normal.   Psych:  Oriented X3, normally interactive, good eye contact, and not anxious appearing.  (below is prior MMSE) Her memory was not that good, and she had difficulty with dates, and answering some questions.  Her answers to fairly simple questions were not consistent when asked the same question a few minutes later.   Impression & Recommendations:  Problem # 1:  DIABETES MELLITUS, TYPE II (ICD-250.00) WIll recheck, and she may need an increase in the metformin if not at goal. Her updated medication list for this problem includes:    Hyzaar 100-25 Mg Tabs (Losartan potassium-hctz) .Marland Kitchen... Take 1 tablet by mouth once a day    Aspirin 81 Mg Tbec (Aspirin) .Marland Kitchen... Take 1 tablet by mouth once a day    Janumet 50-500 Mg Tabs (Sitagliptin-metformin hcl) .Marland Kitchen... Take 1 tablet by mouth two times a day  Orders: T- Capillary Blood Glucose (60454) T-Hgb A1C (in-house) ([id number]) T-Comprehensive Metabolic Panel ([id number])  Labs Reviewed: Creat: 0.75 (08/31/2008)       Last Eye Exam: No diabetic retinopathy.    (04/15/2008) Reviewed HgBA1c results: 6.9 (09/21/2008)  7.1 (06/28/2008)  Problem # 2:  DYSLIPIDEMIA (ICD-272.4)  I doubt this will be enough atorvastatin, though I would not want to increase her amlodipine. Will recheck the LDL and if above 100, increase to 5/40. A total of 20 minutes was spent with Bridget Owens. Her updated medication list for this problem includes:    Caduet 5-20 Mg Tabs (Amlodipine-atorvastatin) .Marland Kitchen... Take 1 tablet by mouth once a day  Orders: T-Comprehensive Metabolic Panel [id number]) T-Lipid Profile ([id number])  Problem # 3:  HYPERTENSION (ICD-401.9) At goal, the clonidine is more for  the hot flashes, and while increasing it might help, I think we would put her at risk of hypotension. Her updated medication list for this problem includes:    Hyzaar 100-25 Mg Tabs (Losartan potassium-hctz) .Marland Kitchen... Take 1 tablet by mouth once a day    Caduet 5-20 Mg Tabs (Amlodipine-atorvastatin) .Marland Kitchen... Take 1 tablet by mouth once a day    Catapres 0.1 Mg Tabs (Clonidine hcl) .Marland Kitchen... Take 1 tab by mouth at bedtime  BP today: 124/78 Prior BP: 123/75 (10/27/2008)  Labs Reviewed: K+: 3.7 (08/31/2008) Creat: : 0.75 (08/31/2008)   Chol: 219 (06/28/2008)   HDL: 46 (06/28/2008)   LDL: 149 (06/28/2008)   TG: 120 (06/28/2008)  Problem # 4:  ALLERGIC RHINITIS (ICD-477.9) Will start zyrtec d and follow for effect. Will provide fluticasone nasal and she can decide if she wants to take it. The following medications were removed from the medication list:    Loratadine 10 Mg Tabs (Loratadine) .Marland Kitchen... Take 1 tablet by mouth once a day Her updated medication list for this problem includes:    Fluticasone Propionate 50 Mcg/act Susp (Fluticasone propionate) .Marland Kitchen... Take 2 sprays in each nostril once daily for 3 days, then once in each nostril daily for 2 weeks  Complete Medication List: 1)  Hyzaar 100-25 Mg Tabs (Losartan potassium-hctz) .... Take 1  tablet by mouth once a day 2)  Aspirin 81 Mg Tbec (Aspirin) .... Take 1 tablet by mouth once a day 3)  Janumet 50-500 Mg Tabs (Sitagliptin-metformin hcl) .... Take 1 tablet by mouth two times a day 4)  Tylenol 8 Hour 650 Mg Tbcr (Acetaminophen) .... Take 1 tablet by mouth three times a day 5)  Caduet 5-20 Mg Tabs (Amlodipine-atorvastatin) .... Take 1 tablet by mouth once a day 6)  Freestyle Lite Test Strp (Glucose blood) .... Use to test blood glucsoe once time a day 7)  Lancets Misc (Lancets) .... Use to test blood glucose one time a day 8)  Freestyle Control Solution Liqd (Blood glucose calibration) .... Use to calibrate meter and assure quality readings. 9)  Catapres 0.1 Mg Tabs (Clonidine hcl) .... Take 1 tab by mouth at bedtime 10)  Fluticasone Propionate 50 Mcg/act Susp (Fluticasone propionate) .... Take 2 sprays in each nostril once daily for 3 days, then once in each nostril daily for 2 weeks 11)  Zyrtec-d Allergy & Congestion 5-120 Mg Xr12h-tab (Cetirizine-pseudoephedrine) .... Take 1 tablet by mouth two times a day 12)  Prodigy No Coding Blood Gluc Strp (Glucose blood) .... Use to check your sugar 1-2 times a day 13)  Prodigy Blood Glucose Monitor Devi (Blood glucose monitoring suppl) .... Use to check your blood sugar  Patient Instructions: 1)  Please return to the clinic in one month if symptoms persist, or 3 months if you are feeling well for a routine check up. Prescriptions: PRODIGY BLOOD GLUCOSE MONITOR  DEVI (BLOOD GLUCOSE MONITORING SUPPL) Use to check your blood sugar  #1 x 0   Entered and Authorized by:   Valetta Close MD   Signed by:   Valetta Close MD on 01/05/2009   Method used:   Electronically to        [facility] [id number] (retail)       [address] [place], [place]  [address]       Ph: [phone]       Fax: [phone]   RxID:  [id number] PRODIGY NO CODING BLOOD GLUC  STRP (GLUCOSE BLOOD) use to check your sugar 1-2  times a day  #60 x 2   Entered and Authorized by:   Valetta Close MD   Signed by:   Valetta Close MD on 01/05/2009   Method used:   Electronically to        [facility] [address] (retail)       [address]       [place], [place]  [address]       Ph: [phone]       Fax: [phone]   RxID:   [id number] ZYRTEC-D ALLERGY & CONGESTION 5-120 MG XR12H-TAB (CETIRIZINE-PSEUDOEPHEDRINE) Take 1 tablet by mouth two times a day  #62 x 3   Entered and Authorized by:   Valetta Close MD   Signed by:   Valetta Close MD on 01/05/2009   Method used:   Electronically to        [facility] [address] (retail)       [address]       [place], [place]  [address]       Ph: [phone]       Fax: [phone]   RxID:   [id number] FLUTICASONE PROPIONATE 50 MCG/ACT SUSP (FLUTICASONE PROPIONATE) Take 2 sprays in each nostril once daily for 3 days, then once in each nostril daily for 2 weeks  #2 wk. supply x 0   Entered and Authorized by:   Valetta Close MD   Signed by:   Valetta Close MD on 01/05/2009   Method used:   Electronically to        [facility] [address] (retail)       [address] [place], [place]  [address]       Ph: [phone]       Fax: [phone]   RxID:   [id number]    Appended Document: HGB A1C    Lab Visit   Laboratory Results   Blood Tests   Date/Time Received: January 05, 2009 11:27 AM  Date/Time Reported: Oren Beckmann  January 05, 2009 11:27 AM   HGBA1C: 7.2%   (Normal Range: Non-Diabetic - 3-6%   Control Diabetic - 6-8%) CBG Random:: 144mg /dL     Orders Today:   Appended Document: acute-earache/cfb(boggala/cfb    Clinical Lists Changes  Medications: Added new medication of BENZONATATE 200 MG CAPS (BENZONATATE) Take one capsule by mouth once every 6 hours as needed for cough - Signed Rx of BENZONATATE 200 MG CAPS (BENZONATATE) Take one capsule by  mouth once every 6 hours as needed for cough;  #14 x 1;  Signed;  Entered by: Valetta Close MD;  Authorized by: Stanton Kidney Ditzler RN;  Method used: Telephoned to [facility] [address], [place], [place], [place]  [address], Ph: [phone], Fax: [phone]    Prescriptions: BENZONATATE 200 MG CAPS (BENZONATATE) Take one capsule by mouth once every 6 hours as needed for cough  #14 x 1   Entered by:   Valetta Close MD   Authorized by:   Stanton Kidney Ditzler RN   Signed by:   Valetta Close MD on 01/05/2009   Method used:   Telephoned to ...       [facility] [id number] (retail)       [address] [place], [place]  [address]       Ph: [phone]       Fax: [phone]   RxID:   [id number]    Appended Document: acute-earache/cfb(boggala/cfb Called above Rx to [facility] [address] per Dr Noel Gerold.

## 2010-11-07 NOTE — Assessment & Plan Note (Signed)
Summary: ACUTE-ITCHING AND BURNING STILL/(BOGGALA)/CFB   Vital Signs:  Patient profile:   55 year old female Height:      65 inches (165.10 cm) Weight:      177.6 pounds (80.73 kg) BMI:     29.66 Temp:     97.1 degrees F (36.17 degrees C) Pulse rate:   101 / minute BP sitting:   101 / 67  (left arm) BP standing:   102 / 68  (left arm) Cuff size:   large  Vitals Entered By: Dorie Rank RN (March 21, 2009 2:09 PM) CC: vaginal itching and discharge x 2wks, also c/o  "itching and burning" off and on x97mths Is Patient Diabetic? Yes  Pain Assessment Patient in pain? yes     Location: legs Intensity: 7 Type: burning Onset of pain  Intermittent x Nutritional Status BMI of 25 - 29 = overweight CBG Result 139  Does patient need assistance? Functional Status Self care Ambulation Normal   Primary Care Provider:  Blondell Reveal MD  CC:  vaginal itching and discharge x 2wks and also c/o  "itching and burning" off and on x42mths.  History of Present Illness: 55 years old female with L sided Bell's palsy 01/2009, Diabetes mellitus, type II, Hypertension, HLD and recent therapy for bacterial vaginosis (7 days Metronidazole orally two times a day) presents with continued complain of vaginal irritation, brown colored discharge and discomfort.  She denies significant urinary symptoms but endorses some burning. She reports compliance to previous abx therapy. She has no fever. She reports occassional lower abdominal pain in right flank. She is sexually active with one partner- on and off. She denies white cruddy discharge.   Preventive Screening-Counseling & Management  Alcohol-Tobacco     Alcohol drinks/day: 0     Smoking Status: current     Smoking Cessation Counseling: yes     Packs/Day: 1.0     Year Started: at the age of 89     Passive Smoke Exposure: yes  Problems Prior to Update: 1)  Bacterial Vaginitis  (ICD-616.10) 2)  Bell's Palsy, Left  (ICD-351.0) 3)  Allergic  Rhinitis  (ICD-477.9) 4)  Adenomatous Colonic Polyp  (ICD-211.3) 5)  Diabetes Mellitus, Type II  (ICD-250.00) 6)  Hypertension  (ICD-401.9) 7)  Hypertensive Retinopathy  (ICD-362.11) 8)  Dyslipidemia  (ICD-272.4) 9)  Pruritus  (ICD-698.9) 10)  Diabetic Peripheral Neuropathy  (ICD-250.60) 11)  Health Maintenance Exam  (ICD-V70.0) 12)  Smoker  (ICD-305.1) 13)  Gerd  (ICD-530.81) 14)  Hx of Leg Cramps  (ICD-729.82) 15)  Fibroids, Uterus  (ICD-218.9) 16)  Perimenopausal Status  (ICD-627.2) 17)  External Hemorrhoids  (ICD-455.3)  Allergies: 1)  ! Codeine 2)  ! Vicodin 3)  ! Doxycycline Hyclate (Doxycycline Hyclate)  Past History:  Past Medical History: Last updated: 02/28/2009 L sided Bell's palsy 01/2009 Diabetes mellitus, type II Hypertension Headache Tobacco use Hyperlipidemia Allergic rhinitis Hx leg cramps on Lipitor GERD Uterine fibroids Hx chest pain : exercise stress test negative 06-07 Perimenopausal ? Insomnia External hemorrhoids  Family History: Last updated: 09-20-08 M: died with bad cough 1969 @ 57yo, not sure about health history S: died with bad cough 1996 @ 55yo, of pneumonia  Social History: Last updated: 11/12/2006 Current Smoker  Risk Factors: Alcohol Use: 0 (03/21/2009) Exercise: no (02/28/2009)  Risk Factors: Smoking Status: current (03/21/2009) Packs/Day: 1.0 (03/21/2009) Passive Smoke Exposure: yes (03/21/2009)  Social History: Packs/Day:  1.0  Review of Systems      See HPI  Physical  Exam  General:  Well-developed,well-nourished,in no acute distress; alert,appropriate and cooperative throughout examination Head:  atraumatic.   Eyes:  vision grossly intact, pupils equal, pupils round, and pupils reactive to light.   Ears:  no external deformities.   Nose:  no external erythema.   Mouth:  pharynx pink and moist.   Neck:  supple, full ROM, and no masses.   Lungs:  Normal respiratory effort, chest expands symmetrically. Lungs  are clear to auscultation, no crackles or wheezes. Heart:  Normal rate and regular rhythm. S1 and S2 normal without gallop, murmur, click, rub or other extra sounds. Abdomen:  Bowel sounds positive,abdomen soft and non-tender without masses, organomegaly or hernias noted. Genitalia:  normal introitus, no external lesions, no vaginal discharge, no vaginal or cervical lesions, no friaility or hemorrhage, and vaginal atrophy.   Msk:  No deformity or scoliosis noted of thoracic or lumbar spine.   Extremities:  itch mark on both shins.  Neurologic:  No cranial nerve deficits noted. Station and gait are normal. Plantar reflexes are down-going bilaterally. DTRs are symmetrical throughout. Sensory, motor and coordinative functions appear intact. Inguinal Nodes:  No significant adenopathy Psych:  Cognition and judgment appear intact. Alert and cooperative with normal attention span and concentration. No apparent delusions, illusions, hallucinations   Impression & Recommendations:  Problem # 1:  UNSPECIFIED VAGINITIS AND VULVOVAGINITIS (ICD-616.10) Patient recently finished treatment with Metronidazole twice a day regimen for 7 days. This is 90% curative. She continues to complain of some itching but more burning. She also is post menopausal. She had benefit from estrogen cream given in the past. On this vaginal exam, she did not have any discharge. I have sent Wet prep and GC probe again. She did have some vaginal atrophic changes. Introitus was pink but wall area were dry and irritated appearing with pale mucosa.   At present, I would provide estrogen cream for two weeks. Guidelines suggest to provide lowest possible dose with shortest duration possible. She had previous PAP smear that did not show any signs of malignancy. She does not have history of PE or DVT.   Patient is instructed on use. However, education was limited due to grandson accompnying her on visit. She also needs to use lubricating agent  upon sexual intercourse.    Orders: T-Chlamydia & GC Probe, Genital ([id number]) T-Wet Prep by Molecular Probe [phone])  Problem # 2:  DIABETES MELLITUS, TYPE II (ICD-250.00) Assessment: Improved Well controlled. Continue on Janumet.  Her updated medication list for this problem includes:    Hyzaar 100-25 Mg Tabs (Losartan potassium-hctz) .Marland Kitchen... Take 1 tablet by mouth once a day    Aspirin 81 Mg Tbec (Aspirin) .Marland Kitchen... Take 1 tablet by mouth once a day    Janumet 50-500 Mg Tabs (Sitagliptin-metformin hcl) .Marland Kitchen... Take 1 tablet by mouth two times a day  Orders: T- Capillary Blood Glucose ([id number]) T-Hgb A1C (in-house) ([id number])  Labs Reviewed: Creat: 0.74 (01/05/2009)     Last Eye Exam: No diabetic retinopathy.    (02/08/2009) Reviewed HgBA1c results: 6.6 (03/21/2009)  7.2 (01/05/2009)  Problem # 3:  BELL'S PALSY, LEFT (ICD-351.0) Not progressive. Patient post treatment with Valtrex and prednisone for a week.   Problem # 4:  HYPERTENSION (ICD-401.9) Assessment: Improved well controlled. Continued therapy.  Her updated medication list for this problem includes:    Hyzaar 100-25 Mg Tabs (Losartan potassium-hctz) .Marland Kitchen... Take 1 tablet by mouth once a day    Catapres 0.1 Mg Tabs (Clonidine hcl) .Marland Kitchen... Take 1 tab  by mouth at bedtime  BP today: 101/67 Prior BP: 156/93 (02/28/2009)  Labs Reviewed: K+: 4.1 (01/05/2009) Creat: : 0.74 (01/05/2009)   Chol: 155 (01/05/2009)   HDL: 44 (01/05/2009)   LDL: 94 (01/05/2009)   TG: 83 (01/05/2009)  Problem # 5:  PRURITUS (ICD-698.9) Assessment: Unchanged Patient has generalised pruritus but more specific to both shins. She suggests that a particular medication helped her in past but can not recall name. She has been prescribed several antihistamines so it is difficult for me to determine which one was most effective. I will provide loratidine for now.   Complete Medication List: 1)  Hyzaar 100-25 Mg Tabs (Losartan potassium-hctz) ....  Take 1 tablet by mouth once a day 2)  Aspirin 81 Mg Tbec (Aspirin) .... Take 1 tablet by mouth once a day 3)  Janumet 50-500 Mg Tabs (Sitagliptin-metformin hcl) .... Take 1 tablet by mouth two times a day 4)  Tylenol 8 Hour 650 Mg Tbcr (Acetaminophen) .... Take 1 tablet by mouth three times a day 5)  Freestyle Lite Test Strp (Glucose blood) .... Use to test blood glucsoe once time a day 6)  Lancets Misc (Lancets) .... Use to test blood glucose one time a day 7)  Freestyle Control Solution Liqd (Blood glucose calibration) .... Use to calibrate meter and assure quality readings. 8)  Catapres 0.1 Mg Tabs (Clonidine hcl) .... Take 1 tab by mouth at bedtime 9)  Prodigy No Coding Blood Gluc Strp (Glucose blood) .... Use to check your sugar 1-2 times a day 10)  Prodigy Blood Glucose Monitor Devi (Blood glucose monitoring suppl) .... Use to check your blood sugar 11)  Premarin 0.625 Mg/gm Crea (Estrogens, conjugated) .... Apply once daily for 2 weeks. twice weekly thereafter 12)  Claritin 10 Mg Tabs (Loratadine) .... Take 1 tablet by mouth once a day  Patient Instructions: 1)  Apply cream as prescribed.  2)  Please schedule a follow-up appointment in 1 month. 3)  Start taking claritin once a day.  Prescriptions: CLARITIN 10 MG TABS (LORATADINE) Take 1 tablet by mouth once a day  #30 x 0   Entered and Authorized by:   Clerance Lav MD   Signed by:   Clerance Lav MD on 03/21/2009   Method used:   Electronically to        [facility] [address] (retail)       [address] [place], [place]  [address]       Ph: [phone]       Fax: [phone]   RxID:   [id number] PREMARIN 0.625 MG/GM CREA (ESTROGENS, CONJUGATED) Apply once daily for 2 weeks. Twice weekly thereafter  #1 x 0   Entered and Authorized by:   Clerance Lav MD   Signed by:   Clerance Lav MD on 03/21/2009   Method used:   Electronically to        [facility] [address] (retail)       [address] [place], [place]  [address]       Ph: [phone]       Fax: [phone]   RxID:   [id number]   Laboratory Results   Blood Tests   Date/Time Received: March 21, 2009 2:28 PM. Date/Time Reported: Alric Quan  March 21, 2009 2:28 PM  HGBA1C:  6.6%   (Normal Range: Non-Diabetic - 3-6%   Control Diabetic - 6-8%) CBG Random:: 139mg /dL

## 2010-11-07 NOTE — Progress Notes (Signed)
Summary: med change/gp  Phone Note Refill Request Message from:  Patient on January 11, 2009 4:52 PM  Pt.states she can not afford Benzonatate; which costs $29.00.  The Pharmacist at [facility] states Medicaid will no longer pay for any type of cough medicine.  Please advise.   Method Requested: Electronic Initial call taken by: Chinita Pester RN,  January 11, 2009 4:52 PM  Follow-up for Phone Call        She should go to [facility] where benzonatate is 4 dollars. Follow-up by: Valetta Close MD,  January 13, 2009 1:02 PM      Appended Document: med change/gp Pt. states Rx has been called in to [facility].

## 2010-11-07 NOTE — Assessment & Plan Note (Signed)
Summary: SKIN ITCH/ SB.   Vital Signs:  Patient Profile:   55 Years Old Female Height:     67 inches (170.18 cm) Weight:      186.7 pounds (84.86 kg) BMI:     29.35 Temp:     96.6 degrees F (35.89 degrees C) oral Pulse rate:   73 / minute BP sitting:   168 / 98  (right arm) Cuff size:   regular  Pt. in pain?   no  Vitals Entered By: Theotis Barrio NT II (June 28, 2008 9:29 AM)              Is Patient Diabetic? Yes  Nutritional Status BMI of 25 - 29 = overweight  Have you ever been in a relationship where you felt threatened, hurt or afraid?No   Does patient need assistance? Functional Status Self care Ambulation Normal     PCP:  Dellia Beckwith MD  Chief Complaint:  ITCHY SKIN  / DUE A1C.  History of Present Illness: 55 y/o with PMH Diabetes mellitus, type II, HbgA1c 7.4, Hypertension, tobacco use, Hyperlipidemia Allergic rhinitis, Hx leg cramps on Lipitor, Hx chest pain : exercise stress test negative 06-07, come sin for DM II and HTN follow up. She does not remeber the medication she is taking and did not brought it with her. non specific itching on her leg. no rashes, no redness,no soap, clothes or contact with any plants.      Prior Medication List:  HYDROCHLOROTHIAZIDE 25 MG TABS (HYDROCHLOROTHIAZIDE) Take 1 tablet by mouth once a day COZAAR 100 MG TABS (LOSARTAN POTASSIUM) Take 1 tablet by mouth once a day ASPIRIN 81 MG TBEC (ASPIRIN) Take 1 tablet by mouth once a day METFORMIN HCL 500 MG  TABS (METFORMIN HCL) Take 1 tablet by mouth two times a day TYLENOL 8 HOUR 650 MG  TBCR (ACETAMINOPHEN) Take 1 tablet by mouth three times a day PROTONIX 40 MG  PACK (PANTOPRAZOLE SODIUM) Take one tablet by mouth daily AMLODIPINE BESYLATE 5 MG  TABS (AMLODIPINE BESYLATE) Take 1 tablet by mouth once a day AMITRIPTYLINE HCL 25 MG  TABS (AMITRIPTYLINE HCL) Take 1 tablet by mouth at bedtime FREESTYLE LITE TEST   STRP (GLUCOSE BLOOD) use to test blood glucsoe once time a  day [BMN] LANCETS   MISC (LANCETS) use to test blood glucose one time a day FREESTYLE CONTROL SOLUTION   LIQD (BLOOD GLUCOSE CALIBRATION) use to calibrate meter and assure quality readings. [BMN] NASONEX 50 MCG/ACT  SUSP (MOMETASONE FUROATE) apply 2 sprays each nostril at bedtime. CLONIDINE HCL 0.1 MG  TABS (CLONIDINE HCL) Take 1 tablet by mouth at bedtime ALTARUSSIN 100 MG/5ML  SYRP (GUAIFENESIN) Take 5 ml twice a day, as advised.   Current Allergies: ! CODEINE ! VICODIN ! DOXYCYCLINE HYCLATE (DOXYCYCLINE HYCLATE)    Risk Factors: Tobacco use:  current    Year started:  at the age of 31    Cigarettes:  Yes -- 1 pack(s) per day Passive smoke exposure:  yes Alcohol use:  no Exercise:  yes    Times per week:  2    Type:  walkiing Seatbelt use:  100 %  Colonoscopy History:    Date of Last Colonoscopy:  03/22/2008  Mammogram History:    Date of Last Mammogram:  03/04/2008  PAP Smear History:    Date of Last PAP Smear:  02/24/2008   Review of Systems  The patient denies anorexia, fever, weight loss, weight gain, vision loss, decreased hearing, hoarseness,  chest pain, syncope, dyspnea on exertion, peripheral edema, prolonged cough, headaches, hemoptysis, abdominal pain, melena, severe indigestion/heartburn, hematuria, incontinence, suspicious skin lesions, transient blindness, difficulty walking, unusual weight change, abnormal bleeding, enlarged lymph nodes, and angioedema.     Physical Exam  General:     Well-developed,well-nourished,in no acute distress; alert,appropriate and cooperative throughout examination Lungs:     Normal respiratory effort, chest expands symmetrically. Lungs are clear to auscultation, no crackles or wheezes. Heart:     Normal rate and regular rhythm. S1 and S2 normal without gallop, murmur, click, rub or other extra sounds. Skin:     Intact without suspicious lesions or rashes    Impression & Recommendations:  Problem # 1:  DIABETES  MELLITUS, TYPE II (ICD-250.00) Not at goal. I do not think the pt is capable of checking her BG, so I am hesitant to start her on glipizide as she might go low, I wil go ahead start her on Janumet  once a day, she is to come in 3 month to recheck her HbgA1c. I  will check a FLP,  if LDL > 100, she will need to be started on statin as her Framingham risk score 12% risk of CV event. Her updated medication list for this problem includes:    Hyzaar 100-25 Mg Tabs (Losartan potassium-hctz) .Marland Kitchen... Take 1 tablet by mouth once a day    Aspirin 81 Mg Tbec (Aspirin) .Marland Kitchen... Take 1 tablet by mouth once a day    Janumet 50-500 Mg Tabs (Sitagliptin-metformin hcl) .Marland Kitchen... Take 1 tablet by mouth two times a day  Orders: T-Hgb A1C (in-house) ([id number]) T-Urine Microalbumin w/creat. ratio [phone] / [id number]) T-Lipid Profile [id number])  Labs Reviewed: HgBA1c: 7.4 (02/24/2008)   Creat: 0.73 (10/29/2007)      Problem # 2:  HYPERTENSION (ICD-401.9) BP still high, pt has a slight mental imparement, I think she is not aking her bP medicationas directed.  I will go ahead and d/c HCTZ and losartan and start her in a combined Hyzaar. She is to come in 2 weeks for BP follow up. The following medications were removed from the medication list:    Hydrochlorothiazide 25 Mg Tabs (Hydrochlorothiazide) .Marland Kitchen... Take 1 tablet by mouth once a day  Her updated medication list for this problem includes:    Hyzaar 100-25 Mg Tabs (Losartan potassium-hctz) .Marland Kitchen... Take 1 tablet by mouth once a day    Amlodipine Besylate 5 Mg Tabs (Amlodipine besylate) .Marland Kitchen... Take 1 tablet by mouth once a day    Clonidine Hcl 0.1 Mg Tabs (Clonidine hcl) .Marland Kitchen... Take 1 tablet by mouth at bedtime  BP today: 168/98 Prior BP: 136/81 (04/23/2008)  Labs Reviewed: Creat: 0.73 (10/29/2007) Chol: 222 (12/25/2005)   HDL: 56 (12/25/2005)   LDL: 150 (12/25/2005)   TG: 78 (12/25/2005)   Problem # 3:  PRURITUS (ICD-698.9) Will start her zyrtec. non specific. no  rashes.  Complete Medication List: 1)  Hyzaar 100-25 Mg Tabs (Losartan potassium-hctz) .... Take 1 tablet by mouth once a day 2)  Aspirin 81 Mg Tbec (Aspirin) .... Take 1 tablet by mouth once a day 3)  Janumet 50-500 Mg Tabs (Sitagliptin-metformin hcl) .... Take 1 tablet by mouth two times a day 4)  Tylenol 8 Hour 650 Mg Tbcr (Acetaminophen) .... Take 1 tablet by mouth three times a day 5)  Protonix 40 Mg Pack (Pantoprazole sodium) .... Take one tablet by mouth daily 6)  Amlodipine Besylate 5 Mg Tabs (Amlodipine besylate) .... Take 1 tablet by mouth once  a day 7)  Amitriptyline Hcl 25 Mg Tabs (Amitriptyline hcl) .... Take 1 tablet by mouth at bedtime 8)  Freestyle Lite Test Strp (Glucose blood) .... Use to test blood glucsoe once time a day 9)  Lancets Misc (Lancets) .... Use to test blood glucose one time a day 10)  Freestyle Control Solution Liqd (Blood glucose calibration) .... Use to calibrate meter and assure quality readings. 11)  Clonidine Hcl 0.1 Mg Tabs (Clonidine hcl) .... Take 1 tablet by mouth at bedtime 12)  Zyrtec Allergy 10 Mg Tabs (Cetirizine hcl) .... Take 1 tablet by mouth once a day   Patient Instructions: 1)  Please schedule an appointment with your primary doctor. 2)  Check your feet each night for sore areas, calluses or signs of infection.   Prescriptions: ZYRTEC ALLERGY 10 MG TABS (CETIRIZINE HCL) Take 1 tablet by mouth once a day  #31 x 0   Entered and Authorized by:   Marinda Elk MD   Signed by:   Marinda Elk MD on 06/28/2008   Method used:   Electronically to        [facility] [id number] (retail)       [address]       [place], [place]  [address]       Ph: [phone]       Fax: [phone]   RxID:   [id number] HYZAAR 100-25 MG TABS (LOSARTAN POTASSIUM-HCTZ) Take 1 tablet by mouth once a day  #31 x 11   Entered and Authorized by:   Marinda Elk MD   Signed by:   Marinda Elk MD on  06/28/2008   Method used:   Electronically to        [facility] [id number] (retail)       [address]       [place], [place]  [address]       Ph: [phone]       Fax: [phone]   RxID:   [id number] JANUMET 50-500 MG TABS (SITAGLIPTIN-METFORMIN HCL) Take 1 tablet by mouth two times a day  #62 x 11   Entered and Authorized by:   Marinda Elk MD   Signed by:   Marinda Elk MD on 06/28/2008   Method used:   Electronically to        [facility] [id number] (retail)       [address]       [place], [place]  [address]       Ph: [phone]       Fax: [phone]   RxID:   [id number]  ] Laboratory Results   Blood Tests   Date/Time Received: June 28, 2008 9:50 AM Date/Time Reported: Alric Quan  June 28, 2008 9:50 AM  HGBA1C: 7.1%   (Normal Range: Non-Diabetic - 3-6%   Control Diabetic - 6-8%)      Last LDL:                                                 150 (12/25/2005 10:01:24 AM)      Diabetic Foot Exam Last Podiatry Exam Date: 06/28/2008  Foot Inspection  Is there a history of a foot ulcer?              No Is there a foot ulcer now?              No Can the patient see the bottom of their feet?          Yes Are the shoes appropriate in style and fit?          Yes Is there swelling or an abnormal foot shape?          No Are the toenails long?                No Are the toenails thick?                No Are the toenails ingrown?              No Is there heavy callous build-up?              No Is there a claw toe deformity?                          No Is there elevated skin temperature?            No Is there limited ankle dorsiflexion?            No Is there foot or ankle muscle weakness?            No Do you have pain in calf while walking?           No      Pulse Check          Right Foot          Left Foot Posterior Tibial:        2+            2+ Dorsalis Pedis:         2+            2+  High Risk Feet? No   10-g (5.07) Semmes-Weinstein Monofilament Test           Right Foot          Left Foot Site 1         normal         normal Site 4         normal         normal Site 5         normal         normal Site 6         normal         normal

## 2010-11-07 NOTE — Assessment & Plan Note (Signed)
Summary: ACUTE-LEGS ITCHING AGAIN/(BOGGALA)/CFB   Vital Signs:  Patient profile:   55 year old female Height:      67 inches (170.18 cm) Weight:      181.06 pounds (82.30 kg) BMI:     28.46 Temp:     97.5 degrees F (36.39 degrees C) oral Pulse rate:   85 / minute BP sitting:   122 / 79  (right arm) Cuff size:   large  Vitals Entered By: Hoy Morn SMA (February 03, 2009 1:41 PM) CC: Pt. c/o of lower leg extremity pyuria, painful knot on the ack of her neck, pt. is also unable to blink her left eye. Is Patient Diabetic? Yes  Pain Assessment Patient in pain? yes     Location: back neck Intensity: 8 Type: throbbing, tight Onset of pain  Pt. states the knot appeared this morning Nutritional Status BMI of 25 - 29 = overweight Nutritional Status Detail appt.- good  Does patient need assistance? Functional Status Self care Ambulation Normal   Primary Care Provider:  Blondell Reveal MD  CC:  Pt. c/o of lower leg extremity pyuria, painful knot on the ack of her neck, and pt. is also unable to blink her left eye.Marland Kitchen  History of Present Illness: Bridget Owens is a 55 y/o woman with DM, HTN and HLPD. She was recently dx'd with Bell's palsy but is coming back to the opc today b/c of RLE itching and a knot on the back of her neck that appeared this morning.  Doing better in terms of her slurred speech/paralysis. Asks how long it will take for the Bell's palsy to go away. Asks what it is caused by b/c her son told her it was 2/2 herpes.  Itching on her legs. Took Benadryl but it didn't help her. Started Sunday. First started a year ago. She gets a rash that no one can see. She was given a medication that took care of it in the past. Has to use a piece of cardboard to scratch b/c she gets so itchy.  Has a knot that came out on the back of her neck this morning. It hurts. Able to move her neck pretty well. Wants to use a hot pad on it.    Preventive Screening-Counseling & Management  Smoking Status: current     Smoking Cessation Counseling: yes     Packs/Day: 0.5     Year Started: at the age of 42     Passive Smoke Exposure: yes     Does Patient Exercise: no     Type of exercise: walkiing     Times/week: 2     Seat Belt Use: 100  Allergies: 1)  ! Codeine 2)  ! Vicodin 3)  ! Doxycycline Hyclate (Doxycycline Hyclate)  Past History:  Past Medical History:    L sided Bell's palsy 01/2009    Diabetes mellitus, type II    Hypertension    Headache    Tobacco use    Hyperlipidemia    Allergic rhinitis    Hx leg cramps on Lipitor    GERD    Uterine fibroids    Hx chest pain : exercise stress test negative 06-07    Perimenopausal    Insomnia    External hemorrhoids    Menopause    Atrophic vaginitis  Review of Systems General:  Denies chills, fatigue, and fever. Eyes:  Denies blurring and double vision. ENT:  Denies postnasal drainage and sore throat. CV:  Denies chest pain  or discomfort, fainting, palpitations, and shortness of breath with exertion. Resp:  Denies shortness of breath and wheezing. GI:  Denies abdominal pain, dark tarry stools, nausea, and vomiting. GU:  Denies dysuria and hematuria. MS:  Denies joint redness and joint swelling. Derm:  Complains of itching; denies lesion(s) and rash. Neuro:  Denies difficulty with concentration, falling down, and headaches. Psych:  Denies anxiety and depression. Endo:  Denies excessive hunger, excessive thirst, and excessive urination. Heme:  Denies abnormal bruising, bleeding, and enlarge lymph nodes. Allergy:  Denies seasonal allergies and sneezing.  Physical Exam  General:  Middle-aged woman in NAD. Obvious Bell's palsy on the L. Head:  atraumatic.   Eyes:  PERRLA, anicteric, no injection. Ears:  no external deformities.   Nose:  no external deformity.   Mouth:  MMM. Neck:  supple, full ROM, no masses, and no thyromegaly. Pt c/o stiffness in the back of her neck (or rather in the upper back)  which is normal to examination, palpation and ROM is preserved.  Chest Wall:  no deformities, no tenderness, and no mass.   Lungs:  normal respiratory effort, no intercostal retractions, no accessory muscle use, normal breath sounds, no crackles, and no wheezes.   Heart:  normal rate, regular rhythm, and no murmur.   Abdomen:  soft, non-tender, and normal bowel sounds.   Pulses:  2+ radial and pedal pulses bilaterally. Extremities:  No e/c/c. Neurologic:  alert & oriented X3, strength normal in all extremities, sensation intact to light touch, gait normal, and DTRs symmetrical and normal. Asymetry in L sided facial nerve: pt still has L sided paresis with slurred speech and is unable to close her L eyelid. Skin:  Although she c/o of pruritus on her anterior R shin, there is no evidence of a rash or lesions (other than evidence of scratching). Psych:  Oriented X3, memory intact for recent and remote, normally interactive, and good eye contact.     Impression & Recommendations:  Problem # 1:  PRURITUS (ICD-698.9) I am not certain what the patient's pruritus is due to. It is limited to her shins and doesn't involve any other parts of her body. She has had it in the past and it resolved with hydroxyzine (from reviewing her chart). I have agreed to refill the hydroxyzine and recommended that she keep her skin hydrated (with a hypoallergenic product).  Problem # 2:  BELL'S PALSY, LEFT (ICD-351.0) Symptoms improving. Patient took a steroid taper and Valtrex. It might take up to 6 months for her to be back to normal (if she does recover full function).  Problem # 3:  HYPERTENSION (ICD-401.9) At goal.  Her updated medication list for this problem includes:    Hyzaar 100-25 Mg Tabs (Losartan potassium-hctz) .Marland Kitchen... Take 1 tablet by mouth once a day    Caduet 5-20 Mg Tabs (Amlodipine-atorvastatin) .Marland Kitchen... Take 1 tablet by mouth once a day    Catapres 0.1 Mg Tabs (Clonidine hcl) .Marland Kitchen... Take 1 tab by mouth  at bedtime  BP today: 122/79 Prior BP: 154/89 (01/18/2009)  Labs Reviewed: K+: 4.1 (01/05/2009) Creat: : 0.74 (01/05/2009)   Chol: 155 (01/05/2009)   HDL: 44 (01/05/2009)   LDL: 94 (01/05/2009)   TG: 83 (01/05/2009)  Problem # 4:  DIABETES MELLITUS, TYPE II (ICD-250.00) At goal  Her updated medication list for this problem includes:    Hyzaar 100-25 Mg Tabs (Losartan potassium-hctz) .Marland Kitchen... Take 1 tablet by mouth once a day    Aspirin 81 Mg Tbec (Aspirin) .Marland KitchenMarland KitchenMarland KitchenMarland Kitchen  Take 1 tablet by mouth once a day    Janumet 50-500 Mg Tabs (Sitagliptin-metformin hcl) .Marland Kitchen... Take 1 tablet by mouth two times a day  Labs Reviewed: Creat: 0.74 (01/05/2009)     Last Eye Exam: No diabetic retinopathy.    (04/15/2008) Reviewed HgBA1c results: 7.2 (01/05/2009)  6.9 (09/21/2008)  Complete Medication List: 1)  Hyzaar 100-25 Mg Tabs (Losartan potassium-hctz) .... Take 1 tablet by mouth once a day 2)  Aspirin 81 Mg Tbec (Aspirin) .... Take 1 tablet by mouth once a day 3)  Janumet 50-500 Mg Tabs (Sitagliptin-metformin hcl) .... Take 1 tablet by mouth two times a day 4)  Tylenol 8 Hour 650 Mg Tbcr (Acetaminophen) .... Take 1 tablet by mouth three times a day 5)  Caduet 5-20 Mg Tabs (Amlodipine-atorvastatin) .... Take 1 tablet by mouth once a day 6)  Freestyle Lite Test Strp (Glucose blood) .... Use to test blood glucsoe once time a day 7)  Lancets Misc (Lancets) .... Use to test blood glucose one time a day 8)  Freestyle Control Solution Liqd (Blood glucose calibration) .... Use to calibrate meter and assure quality readings. 9)  Catapres 0.1 Mg Tabs (Clonidine hcl) .... Take 1 tab by mouth at bedtime 10)  Fluticasone Propionate 50 Mcg/act Susp (Fluticasone propionate) .... Take 2 sprays in each nostril once daily for 3 days, then once in each nostril daily for 2 weeks 11)  Zyrtec-d Allergy & Congestion 5-120 Mg Xr12h-tab (Cetirizine-pseudoephedrine) .... Take 1 tablet by mouth two times a day 12)  Prodigy No Coding  Blood Gluc Strp (Glucose blood) .... Use to check your sugar 1-2 times a day 13)  Prodigy Blood Glucose Monitor Devi (Blood glucose monitoring suppl) .... Use to check your blood sugar 14)  Benzonatate 200 Mg Caps (Benzonatate) .... Take one capsule by mouth once every 6 hours as needed for cough 15)  Nicotrol 10 Mg Inha (Nicotine) .... Use as often as needed whenever you have urge to smoke cig. use a minimum of 6 cartiges per day and a maximum of 16. 16)  Prednisone 50 Mg Tabs (Prednisone) .... Take one tab by mouth daily for 7 days. 17)  Valtrex 500 Mg Tabs (Valacyclovir hcl) .... Take 2 tabs by mouth three times a day for 7 days. 18)  Hydroxyzine Hcl 25 Mg Tabs (Hydroxyzine hcl) .... Take one tab by mouth every 6 to 8 hours as needed for itching.  Patient Instructions: 1)  Follow up as previously scheduled. 2)  Take hydroxyzine 25 mg by mouth every 6-8 hours as needed for itching. 3)  Try not to scratch your skin too much because it will make your skin itch more ! 4)  Consider using the nicotine gum that you were prescribed on your next visit. Smoking is bad for your health ! Prescriptions: HYDROXYZINE HCL 25 MG TABS (HYDROXYZINE HCL) Take one tab by mouth every 6 to 8 hours as needed for itching.  #20 x 1   Entered and Authorized by:   Olene Craven MD   Signed by:   Olene Craven MD on 02/03/2009   Method used:   Electronically to        [facility] [id number] (retail)       [address] [place], [place]  [address]       Ph: [phone]       Fax: [phone]   RxID:  [id number]  

## 2010-11-07 NOTE — Consult Note (Signed)
Summary: Dr[facility]  Dr[facility]   Imported By: Florinda Marker 04/20/2008 15:58:40  _____________________________________________________________________  External Attachment:    Type:   Image     Comment:   External Document  Appended Document: Dr[facility]    Clinical Lists Changes  Observations: Added new observation of [id number]: 04/2009 (07/30/2008 10:18) Added new observation of DIAB EYE EX: No diabetic retinopathy.    (04/15/2008 10:19)       Diabetic Eye Exam  Procedure date:  04/15/2008  Findings:      No diabetic retinopathy.     Procedures Next Due Date:    Diabetic Eye Exam: 04/2009   Diabetic Eye Exam  Procedure date:  04/15/2008  Findings:      No diabetic retinopathy.     Procedures Next Due Date:    Diabetic Eye Exam: 04/2009

## 2010-11-07 NOTE — Progress Notes (Signed)
Summary: Phone note-medications/gp  Phone Note Call from Patient   Caller: Patient Details for Reason: medications Summary of Call: Pt. called the clinic and stated she has not been taking "all these medicines"  (on the instruction sheet from 9/21).  And the doctor must have her confused with somenone else.  Stated she will not take "those new ones".   Talked with Dr. Robb Matar; stated to make her an appt. with him to review meds. Initial call taken by: Chinita Pester RN,  June 30, 2008 12:24 PM

## 2010-11-07 NOTE — Assessment & Plan Note (Signed)
Summary: leg pain [mkj]   Vital Signs:  Patient Profile:   55 Years Old Female Height:     67 inches (170.18 cm) Weight:      187.3 pounds BMI:     29.44 Temp:     97.8 degrees F oral Pulse rate:   71 / minute BP sitting:   143 / 89  (right arm)  Pt. in pain?   yes    Location:   LEG    Intensity:   7    Type:       sharp  Vitals Entered By: Filomena Jungling NT II (April 07, 2008 10:34 AM)              Is Patient Diabetic? Yes  Nutritional Status BMI of 25 - 29 = overweight  Have you ever been in a relationship where you felt threatened, hurt or afraid?No   Does patient need assistance? Functional Status Self care Ambulation Normal     PCP:  Dellia Beckwith MD  Chief Complaint:  KNOT ON LEFT LEG / PATIENT JUST GOTOFF ANTIBOTIC NOW HAS YEAST INFECTION.  History of Present Illness: Patient complains of pain in the right leg for the past 7-10 days. The pain started just after her colonoscopy about 10 days ago. After coming home, when tried to get up from the bed, she felt some pain in her right leg. The pain is constant, 5-6/10 in severity and is aggravated by walking.  The pain was much more severe initially but is now getting better. The pain is located right over the lateral aspect of the right lower fibula. She denies use of any pain medications or topical creams. She denies any fever, SOB, Chest pain, pain in the calf muscles, pain in the other leg. Patient is concerned if this a DVT.  Patient complains of hot flashes for the past 4 years. She complains of dryness of mouth and nausea. No history of difficulty swallowing of painful swallowing. She is asking for some prescription for these symptoms.    Prior Medications Reviewed Using: Patient Recall  Prior Medication List:  HYDROCHLOROTHIAZIDE 25 MG TABS (HYDROCHLOROTHIAZIDE) Take 1 tablet by mouth once a day COZAAR 100 MG TABS (LOSARTAN POTASSIUM) Take 1 tablet by mouth once a day ASPIRIN 81 MG TBEC (ASPIRIN) Take 1  tablet by mouth once a day METFORMIN HCL 500 MG  TABS (METFORMIN HCL) Take 1 tablet by mouth two times a day TYLENOL 8 HOUR 650 MG  TBCR (ACETAMINOPHEN) Take 1 tablet by mouth three times a day PROTONIX 40 MG  PACK (PANTOPRAZOLE SODIUM) Take one tablet by mouth daily AMLODIPINE BESYLATE 5 MG  TABS (AMLODIPINE BESYLATE) Take 1 tablet by mouth once a day AMITRIPTYLINE HCL 25 MG  TABS (AMITRIPTYLINE HCL) Take 1 tablet by mouth at bedtime FREESTYLE LITE TEST   STRP (GLUCOSE BLOOD) use to test blood glucsoe once time a day [BMN] LANCETS   MISC (LANCETS) use to test blood glucose one time a day FREESTYLE CONTROL SOLUTION   LIQD (BLOOD GLUCOSE CALIBRATION) use to calibrate meter and assure quality readings. [BMN] NASONEX 50 MCG/ACT  SUSP (MOMETASONE FUROATE) apply 2 sprays each nostril at bedtime. CETIRIZINE HCL 10 MG  TABS (CETIRIZINE HCL) Take 1 tablet by mouth once a day   Current Allergies: ! CODEINE ! VICODIN ! DOXYCYCLINE HYCLATE (DOXYCYCLINE HYCLATE)  Past Medical History:    Reviewed history from 04/22/2007 and no changes required:       Diabetes mellitus, type II  Hypertension       Headache       Tobacco use       Hyperlipidemia       Allergic rhinitis       Hx leg cramps on Lipitor       GERD       Uterine fibroids       Hx chest pain : exercise stress test negative 06-07       Perimenopausal       Insomnia       External hemorrhoids       Menopause       Atrophic vaginitis   Social History:    Reviewed history from 11/12/2006 and no changes required:       Current Smoker   Risk Factors: Tobacco use:  current    Year started:  at the age of 82    Cigarettes:  Yes -- 1 pack(s) per day Passive smoke exposure:  yes Alcohol use:  no Exercise:  yes    Times per week:  2    Type:  walkiing Seatbelt use:  100 %  Colonoscopy History:    Date of Last Colonoscopy:  01/06/2008  Mammogram History:    Date of Last Mammogram:  03/04/2008  PAP Smear History:     Date of Last PAP Smear:  02/24/2008   Review of Systems  General      Denies fever and weakness.  CV      Denies chest pain or discomfort, fainting, lightheadness, and palpitations.  Resp      Denies chest discomfort, chest pain with inspiration, and shortness of breath.  GI      Denies abdominal pain.  Other ROS:  Musculoskeletal: Patient complains of tenderness over the lateral aspect of the right lower fibula. Denies back pain,ankle joint pain, joint swelling, muscle cramps, muscle weakness, stiffness, arthritis, sciatica, restless legs, leg pain at night, calf muscle pain.   Physical Exam  General:     alert, healthy-appearing, good hygiene, and overweight-appearing.   Lungs:     normal respiratory effort and normal breath sounds.   Heart:     normal rate, regular rhythm, no murmur, no gallop, and no rub.   Msk:     Mild to moderate tenderness noted over the lateral aspect of the right lower fibula. No swelling, no erythema, no decreased range of movements. Extremities:     No edema, no calf tenderness.    Impression & Recommendations:  Problem # 1:  LEG PAIN, RIGHT (ICD-729.5) Patients leg pain is constant and is over the lateral aspect of the right lower fibula. Mild tenderness observed over the area and there is no swelling or erythema. Bony injury/ stress fracture could be a possibility. We will do an Xray of the right lower leg and ankle.  We will treat symptomatically with OTC Tylenol or Ibuprofen, as needed. We will see the patient again after one week and reassess.  Orders: Diagnostic X-Ray/Fluoroscopy (Diagnostic X-Ray/Flu)   Problem # 2:  DIABETES MELLITUS, TYPE II (ICD-250.00) She doesnt remember her recent blood sugar values and given HbA1C in the last month being 7.4, we will check HbA1C. We will also check  her CMP and we will follow up this patient next week. Her updated medication list for this problem includes:    Cozaar 100 Mg Tabs (Losartan  potassium) .Marland Kitchen... Take 1 tablet by mouth once a day    Aspirin 81 Mg Tbec (Aspirin) .Marland Kitchen... Take 1  tablet by mouth once a day    Metformin Hcl 500 Mg Tabs (Metformin hcl) .Marland Kitchen... Take 1 tablet by mouth two times a day  Labs Reviewed: HgBA1c: 7.4 (02/24/2008)   Creat: 0.73 (10/29/2007)     Future Orders: T-Comprehensive Metabolic Panel ([id number]) ... 04/08/2008 T-Hgb A1C (in-house) [id number]) ... 04/08/2008   Problem # 3:  HYPERTENSION (ICD-401.9) She is doing fairly Ok as  far as her BP is concerned. We have added Clonidine for her menopausal symptoms and that should bring her blood pressure down a little bit. We will review all the medications in the next visit. Her updated medication list for this problem includes:    Hydrochlorothiazide 25 Mg Tabs (Hydrochlorothiazide) .Marland Kitchen... Take 1 tablet by mouth once a day    Cozaar 100 Mg Tabs (Losartan potassium) .Marland Kitchen... Take 1 tablet by mouth once a day    Amlodipine Besylate 5 Mg Tabs (Amlodipine besylate) .Marland Kitchen... Take 1 tablet by mouth once a day    Clonidine Hcl 0.1 Mg Tabs (Clonidine hcl) .Marland Kitchen... Take 1 tablet by mouth at bedtime  BP today: 143/89 Prior BP: 149/85 (03/23/2008)  Labs Reviewed: Creat: 0.73 (10/29/2007) Chol: 222 (12/25/2005)   HDL: 56 (12/25/2005)   LDL: 150 (12/25/2005)   TG: 78 (12/25/2005)   Problem # 4:  PERIMENOPAUSAL STATUS (ICD-627.2) She has been complaining of hot flashes for the past couple of years, but she thinks they are getting worse. She also complains of a feeling of nausea with hot flashes. We have started her on Clonidine 0.1 mg daily once at bed time. We will assess her response to Clonidine in her next visit and follow up. Her updated medication list for this problem include: Clonidine 0.1 mg, daily once at bed time.  Problem # 5:  DYSLIPIDEMIA (ICD-272.4) Her Cholesterol and LDL were a little elevated back in 2007. We will order a lipid pannel and if they are still elevated we will consider putting her on  Statins. Patient was advised on dietary modification, as well. Future Orders: T-Lipid Profile [id number]) ... 04/08/2008  Labs Reviewed: Chol: 222 (12/25/2005)   HDL: 56 (12/25/2005)   LDL: 150 (12/25/2005)   TG: 78 (12/25/2005) SGOT: 14 (10/29/2007)   SGPT: 20 (10/29/2007)   Problem # 6:  SMOKER (ICD-305.1) I discussed smoking cessation with patient and she is not intersted in persuing at this time. We will readdress this in her next visit.  Complete Medication List: 1)  Hydrochlorothiazide 25 Mg Tabs (Hydrochlorothiazide) .... Take 1 tablet by mouth once a day 2)  Cozaar 100 Mg Tabs (Losartan potassium) .... Take 1 tablet by mouth once a day 3)  Aspirin 81 Mg Tbec (Aspirin) .... Take 1 tablet by mouth once a day 4)  Metformin Hcl 500 Mg Tabs (Metformin hcl) .... Take 1 tablet by mouth two times a day 5)  Tylenol 8 Hour 650 Mg Tbcr (Acetaminophen) .... Take 1 tablet by mouth three times a day 6)  Protonix 40 Mg Pack (Pantoprazole sodium) .... Take one tablet by mouth daily 7)  Amlodipine Besylate 5 Mg Tabs (Amlodipine besylate) .... Take 1 tablet by mouth once a day 8)  Amitriptyline Hcl 25 Mg Tabs (Amitriptyline hcl) .... Take 1 tablet by mouth at bedtime 9)  Freestyle Lite Test Strp (Glucose blood) .... Use to test blood glucsoe once time a day 10)  Lancets Misc (Lancets) .... Use to test blood glucose one time a day 11)  Freestyle Control Solution Liqd (Blood glucose calibration) .... Use to  calibrate meter and assure quality readings. 12)  Nasonex 50 Mcg/act Susp (Mometasone furoate) .... Apply 2 sprays each nostril at bedtime. 13)  Cetirizine Hcl 10 Mg Tabs (Cetirizine hcl) .... Take 1 tablet by mouth once a day 14)  Clonidine Hcl 0.1 Mg Tabs (Clonidine hcl) .... Take 1 tablet by mouth at bedtime   Patient Instructions: 1)  Return for follow up visit in 1 week. 2)  Return for fasting labs within 1 Week. 3)  Start Clonidine 0.1 mg one tablet at bed time for Hot flashes. 4)   Take Over the counter Ibuprofen orTylenol as directed, as needed for pain.  Note: Electronic prescription failed, so I reprinted the prescription and handed it to the patient. Prescriptions: CLONIDINE HCL 0.1 MG  TABS (CLONIDINE HCL) Take 1 tablet by mouth at bedtime  #30 x 0   Entered and Authorized by:   Blondell Reveal MD   Signed by:   Blondell Reveal MD on 04/07/2008   Method used:   Reprint   RxID:   [id number] CLONIDINE HCL 0.1 MG  TABS (CLONIDINE HCL) Take 1 tablet by mouth at bedtime  #30 x 0   Entered and Authorized by:   Blondell Reveal MD   Signed by:   Blondell Reveal MD on 04/07/2008   Method used:   Electronically sent to ...       [facility] [id number]       [address]       [place], [place]  [address]       Ph: [phone]       Fax: [phone]   RxID:   [id number]  ]

## 2010-11-07 NOTE — Progress Notes (Signed)
Summary: Medication  Phone Note Outgoing Call   Call placed by: Angelina Ok RN,  June 22, 2009 2:57 PM Call placed to: Patient Summary of Call: Call to pt informed of need to start Lipitor for an elevated Colesterol level.  Pt was advised to take at night and to call if she has problems.  Medication is currently at the Pharmacy/ [facility] on [address] for pick up.  Pt voiced understanding of plan.    Angelina Ok RN  June 22, 2009 2:58 PM   Initial call taken by: Angelina Ok RN,  June 22, 2009 2:58 PM  Follow-up for Phone Call        Will follow up.

## 2010-11-07 NOTE — Progress Notes (Signed)
Summary: Prior Authorization Hyzaar  Phone Note Outgoing Call   Call placed by: Angelina Ok RN,  September 13, 2010 11:27 AM Call placed to: Insurer Summary of Call: Prior Autrhorization approved for Hyzaar 100/25 mg tablerts # 31.  Dates 09/13/2010 thur 09/13/2011. Angelina Ok RN  September 13, 2010 11:29 AM  Initial call taken by: Angelina Ok RN,  September 13, 2010 11:29 AM    New/Updated Medications: HYZAAR 100-25 MG TABS (LOSARTAN POTASSIUM-HCTZ) Take 1 tablet by mouth once a day

## 2010-11-07 NOTE — Progress Notes (Signed)
  Phone Note Outgoing Call   Call placed by: Theotis Barrio NT II,  July 22, 2009 11:53 AM Call placed to: gynPatient Details for Reason: GYN APPT Summary of Call: SPOKE WITH Bridget Owens AND GAVE HER THE GYN APPT / OCT 21, 010 AT 1:20PM WITH DR HARPER @  [address] / [phone]. Lela Sturdivant NT II  July 22, 2009 11:55 AM  Initial call taken by: Theotis Barrio NT II,  July 22, 2009 11:53 AM

## 2010-11-07 NOTE — Consult Note (Signed)
Summary: [facility]: TB Control  [facility]: TB Control   Imported By: Florinda Marker 11/18/2008 11:52:57  _____________________________________________________________________  External Attachment:    Type:   Image     Comment:   External Document

## 2010-11-07 NOTE — Consult Note (Signed)
Summary: [facility]: TB Control  [facility]: TB Control   Imported By: Florinda Marker 12/17/2008 14:09:07  _____________________________________________________________________  External Attachment:    Type:   Image     Comment:   External Document

## 2010-11-07 NOTE — Progress Notes (Signed)
  Phone Note Outgoing Call   Call placed by: Theotis Barrio NT II,  August 24, 2009 2:10 PM Call placed to: Patient Details for Reason: GYN APPT WITH DR HARPER Summary of Call: CALLED AND SPOKE WITH Bridget Owens, AS TO WHY SHE MISSED HER APPT., PATIENT STATES THAT SHE HAD DEATH IN FAMILY. PATIENT WAS INSTRUCTED TO CALL [facility] TO MAKE ANOTHER APPT.  JASMINE AT [facility] IS AWARE OF PATIENT TO CALL TO MAKE THIS APPT.  LELA STURDIVANT NTII

## 2010-11-07 NOTE — Miscellaneous (Signed)
Summary: HIV Testing  HIV Testing   Imported By: Florinda Marker 10/27/2008 15:42:50  _____________________________________________________________________  External Attachment:    Type:   Image     Comment:   External Document

## 2010-11-07 NOTE — Assessment & Plan Note (Signed)
Summary: acute-itching-(duguay)/cfb   Vital Signs:  Patient Profile:   55 Years Old Female Height:     67 inches (170.18 cm) Weight:      180.1 pounds (81.86 kg) BMI:     28.31 Temp:     98.7 degrees F (37.06 degrees C) oral Pulse rate:   81 / minute BP sitting:   176 / 90  (right arm) Cuff size:   regular  Pt. in pain?   no  Vitals Entered By: Theotis Barrio NT II (Feb 24, 2008 11:10 AM)              Is Patient Diabetic? Yes  Nutritional Status BMI of 25 - 29 = overweight CBG Result 259  Does patient need assistance? Functional Status Self care Ambulation Normal     PCP:  Dellia Beckwith MD  Chief Complaint:  VAGINAL ITCHING SINCE SUNDAY / PAP SMEAR / LMP- AGE OF 55.  History of Present Illness: Has been having vaginal itching for the past 3 days. Has not used any OTC medications. Has sexual intercourse with one partner. Has noticed a thick, white vaginal discharge. No dysuria.    Prior Medication List:  HYDROCHLOROTHIAZIDE 25 MG TABS (HYDROCHLOROTHIAZIDE) Take 1 tablet by mouth once a day COZAAR 100 MG TABS (LOSARTAN POTASSIUM) Take 1 tablet by mouth once a day ASPIRIN 81 MG TBEC (ASPIRIN) Take 1 tablet by mouth once a day METFORMIN HCL 500 MG  TABS (METFORMIN HCL) Take 1 tablet by mouth two times a day NICODERM CQ 14 MG/24HR PT24 (NICOTINE) Apply one patch daily TYLENOL 8 HOUR 650 MG  TBCR (ACETAMINOPHEN) Take 1 tablet by mouth three times a day PREMARIN 0.625 MG/GM  CREA (ESTROGENS, CONJUGATED) Apply 0.5 mg intravaginally daily PROTONIX 40 MG  PACK (PANTOPRAZOLE SODIUM) Take one tablet by mouth daily ALLERMAX 25 MG  TABS (DIPHENHYDRAMINE HCL) one tablet every 12 hours as needed for nasal congestion. HYDROXYZINE HCL 25 MG  TABS (HYDROXYZINE HCL) take one tab once every 6-8 hours as needed for itching.  Do not take with benadryl EUCERIN   LOTN (EMOLLIENT) apply to affected area once every four hours as needed for itching FLUCONAZOLE 150 MG  TABS (FLUCONAZOLE)  Take 1 tablet by mouth once a day X 1 AMLODIPINE BESYLATE 5 MG  TABS (AMLODIPINE BESYLATE) Take 1 tablet by mouth once a day GLIPIZIDE 5 MG  TB24 (GLIPIZIDE) Take 1 tablet by mouth once a day AMITRIPTYLINE HCL 25 MG  TABS (AMITRIPTYLINE HCL) Take 1 tablet by mouth at bedtime   Current Allergies: ! CODEINE ! VICODIN ! DOXYCYCLINE HYCLATE (DOXYCYCLINE HYCLATE)   Social History:    Reviewed history from 11/12/2006 and no changes required:       Current Smoker   Risk Factors: Tobacco use:  current    Year started:  at the age of 14    Cigarettes:  Yes -- 1/2-1 pack(s) per day Passive smoke exposure:  yes Alcohol use:  no Exercise:  yes    Times per week:  2    Type:  walkiing Seatbelt use:  100 %  Colonoscopy History:    Date of Last Colonoscopy:  01/06/2008  Mammogram History:    Date of Last Mammogram:  01/02/2006  PAP Smear History:    Date of Last PAP Smear:  01/07/2004   Review of Systems  The patient denies fever, weight loss, chest pain, dyspnea on exhertion, abdominal pain, and severe indigestion/heartburn.     Physical Exam  General:  Well-developed,well-nourished,in no acute distress; alert,appropriate and cooperative throughout examination Abdomen:     Bowel sounds positive,abdomen soft and non-tender without masses, organomegaly or hernias noted. Genitalia:     Normal introitus for age, no external lesions, thick white vaginal discharge, mucosa pink and moist, no vaginal or cervical lesions, no vaginal atrophy, no friaility or hemorrhage, normal uterus size and position, no adnexal masses or tenderness    Impression & Recommendations:  Problem # 1:  PRURITUS, VAGINAL (ICD-698.1) I have performed a pelvic today with wet prep, pap smear and GC probe today. She had very erythematous vaginal walls and a thick, white vaginal discharge. Will empirically treat with fluconazole 150mg  once. Will call pt with results.  Orders: T-Wet Prep (in-house)  [phone]) T-GC Probe, genital ([id number]) T-Pap Smear, Thin Prep  ([id number])   Complete Medication List: 1)  Hydrochlorothiazide 25 Mg Tabs (Hydrochlorothiazide) .... Take 1 tablet by mouth once a day 2)  Cozaar 100 Mg Tabs (Losartan potassium) .... Take 1 tablet by mouth once a day 3)  Aspirin 81 Mg Tbec (Aspirin) .... Take 1 tablet by mouth once a day 4)  Metformin Hcl 500 Mg Tabs (Metformin hcl) .... Take 1 tablet by mouth two times a day 5)  Nicoderm Cq 14 Mg/24hr Pt24 (Nicotine) .... Apply one patch daily 6)  Tylenol 8 Hour 650 Mg Tbcr (Acetaminophen) .... Take 1 tablet by mouth three times a day 7)  Premarin 0.625 Mg/gm Crea (Estrogens, conjugated) .... Apply 0.5 mg intravaginally daily 8)  Protonix 40 Mg Pack (Pantoprazole sodium) .... Take one tablet by mouth daily 9)  Allermax 25 Mg Tabs (Diphenhydramine hcl) .... One tablet every 12 hours as needed for nasal congestion. 10)  Hydroxyzine Hcl 25 Mg Tabs (Hydroxyzine hcl) .... Take one tab once every 6-8 hours as needed for itching.  do not take with benadryl 11)  Eucerin Lotn (Emollient) .... Apply to affected area once every four hours as needed for itching 12)  Fluconazole 150 Mg Tabs (Fluconazole) .... Take 1 tablet by mouth once a day x 1 13)  Amlodipine Besylate 5 Mg Tabs (Amlodipine besylate) .... Take 1 tablet by mouth once a day 14)  Glipizide 5 Mg Tb24 (Glipizide) .... Take 1 tablet by mouth once a day 15)  Amitriptyline Hcl 25 Mg Tabs (Amitriptyline hcl) .... Take 1 tablet by mouth at bedtime 16)  Fluconazole 150 Mg Tabs (Fluconazole) .... Take one tablet once.  Other Orders: T- Capillary Blood Glucose ([id number]) T-Hgb A1C (in-house) ([id number]) Capillary Blood Glucose ([id number]) Fingerstick ([id number])   Patient Instructions: 1)  Please schedule a follow-up appointment in 1 month. 2)  Take the fluconazole 150 mg 1 tablet once.   Prescriptions: FLUCONAZOLE 150 MG  TABS (FLUCONAZOLE) Take one tablet once.  #1 x 0   Entered  and Authorized by:   Peggye Pitt MD   Signed by:   Peggye Pitt MD on 02/24/2008   Method used:   Print then Give to Patient   RxID:   [id number]  ] Laboratory Results   Blood Tests   Date/Time Received: Feb 24, 2008 11:34 AM  Date/Time Reported: Oren Beckmann  Feb 24, 2008 11:35 AM   HGBA1C: 7.4%   (Normal Range: Non-Diabetic - 3-6%   Control Diabetic - 6-8%) CBG Random:: 259mg /dL     Appended Document: Orders Update    Clinical Lists Changes  Orders: Added new Test order of T-Chlamydia ([id number]) - Signed

## 2010-11-07 NOTE — Progress Notes (Signed)
Summary: refill/ hla  Phone Note Refill Request Message from:  Fax from Pharmacy on June 17, 2008 9:55 AM  Refills Requested: Medication #1:  CLONIDINE HCL 0.1 MG  TABS Take 1 tablet by mouth at bedtime   Last Refilled: 7/6 Initial call taken by: Marin Roberts RN,  June 17, 2008 9:56 AM  Follow-up for Phone Call        Refill approved-nurse to complete Follow-up by: Ulyess Mort MD,  June 17, 2008 10:07 AM      Prescriptions: CLONIDINE HCL 0.1 MG  TABS (CLONIDINE HCL) Take 1 tablet by mouth at bedtime  #30 x 11   Entered and Authorized by:   Ulyess Mort MD   Signed by:   Ulyess Mort MD on 06/17/2008   Method used:   Electronically to        [facility] [id number] (retail)       [address] [place], [place]  [address]       Ph: [phone]       Fax: [phone]   RxID:   [id number]

## 2010-11-07 NOTE — Assessment & Plan Note (Signed)
Summary: FU/EST/VS   Vital Signs:  Patient Profile:   55 Years Old Female Height:     67 inches (170.18 cm) Weight:      188.05 pounds (85.48 kg) BMI:     29.56 Temp:     97.4 degrees F (36.33 degrees C) oral Pulse rate:   89 / minute BP sitting:   167 / 100  (right arm)  Pt. in pain?   yes    Location:   Vag, ear    Intensity:   10    Type:       aching  Vitals Entered By: Angelina Ok RN (December 02, 2007 9:08 AM)              Is Patient Diabetic? Yes  Nutritional Status BMI of > 30 = obese CBG Result 289  Have you ever been in a relationship where you felt threatened, hurt or afraid?No   Does patient need assistance? Functional Status Self care Ambulation Normal     PCP:  Dellia Beckwith MD  Chief Complaint:  Check up Ear and burning between legs.  History of Present Illness: This is a 55 year old woman with a past medical history of Diabetes mellitus, type II Hypertension Headache Tobacco use Hyperlipidemia Allergic rhinitis Hx leg cramps on Lipitor GERD Uterine fibroids Hx chest pain : exercise stress test negative 06-07 Perimenopausal Insomnia External hemorrhoids Menopause Atrophic vaginitis   coming for routine follow-up and recheck of her abdominal pain.  She has multiple complaints again : 1- Chronic bilateral ear pain, worse on right.  She says it is sometimes associated with her sinus congestion, but not all the time.  She admits to using bobby-pins in her ears because they itch!!! She has no fever or chills, no ear drainage. 2- Abdominal pain : She had previously complained of lower left abdominal pain, for which she underwent a pelvic ultrasound, which was normal except for some shrinking fibroids, and a CBC and CMET that were normal.  She says the pain is still there occasionnally, but sometimes is on the left, sometimes on the right.  No diarrhea or constipation, no blood in stool. 3- Having vulvar burning and itching since she  finished her antibiotics.  No dysuria or frequency 4- Complains of difficulty falling asleep every night.  Tried Ambien in the past without success.    Current Allergies: ! CODEINE ! VICODIN ! DOXYCYCLINE HYCLATE (DOXYCYCLINE HYCLATE)    Risk Factors:     Counseled to quit/cut down tobacco use:  yes   Review of Systems  General      Complains of sleep disorder.      Denies chills and fever.  Eyes      Denies blurring.  ENT      Complains of earache.      Denies decreased hearing, ear discharge, nasal congestion, ringing in ears, and sinus pressure.      Has rhinorrhea and allergy symptoms.  CV      Denies chest pain or discomfort, difficulty breathing at night, shortness of breath with exertion, and swelling of feet.  Resp      Denies cough, shortness of breath, and sputum productive.  GI      Complains of abdominal pain.      Denies bloody stools, change in bowel habits, constipation, dark tarry stools, and diarrhea.  GU      Denies abnormal vaginal bleeding, dysuria, and urinary frequency.  Allergy      Complains  of seasonal allergies.   Physical Exam  General:     alert, well-developed, well-nourished, and overweight-appearing.   Head:     normocephalic and atraumatic.   Eyes:     vision grossly intact, pupils equal, pupils round, and pupils reactive to light.   Ears:     R ear normal and L ear normal besides some mild irritation of ear canal bilaterally, without infection. Nose:     no external deformity, no sinus percussion tenderness, and mucosal edema.   Mouth:     pharynx pink and moist and poor dentition.   Neck:     supple and full ROM.   Lungs:     Normal respiratory effort, chest expands symmetrically. Lungs are clear to auscultation, no crackles or wheezes. Heart:     Normal rate and regular rhythm. S1 and S2 normal without gallop, murmur, click, rub or other extra sounds. Abdomen:     Bowel sounds positive,abdomen soft and non-tender  without masses, organomegaly or hernias noted. Extremities:     No clubbing, cyanosis, edema, or deformity noted with normal full range of motion of all joints.      Impression & Recommendations:  Problem # 1:  HYPERTENSION (ICD-401.9) Uncontrolled.  Add amlodipine, follow-up in one month. Her updated medication list for this problem includes:    Hydrochlorothiazide 25 Mg Tabs (Hydrochlorothiazide) .Marland Kitchen... Take 1 tablet by mouth once a day    Cozaar 100 Mg Tabs (Losartan potassium) .Marland Kitchen... Take 1 tablet by mouth once a day    Amlodipine Besylate 5 Mg Tabs (Amlodipine besylate) .Marland Kitchen... Take 1 tablet by mouth once a day  BP today: 167/100 Prior BP: 147/89 (10/29/2007)  Labs Reviewed: Creat: 0.73 (10/29/2007) Chol: 222 (12/25/2005)   HDL: 56 (12/25/2005)   LDL: 150 (12/25/2005)   TG: 78 (12/25/2005)   Problem # 2:  DIABETES MELLITUS, TYPE II (ICD-250.00) Hgb A1c 8.1.  Will increase metformin to 1000 mg two times a day and add small dose glipizide.  Recheck A1c in 3 months. Her updated medication list for this problem includes:    Cozaar 100 Mg Tabs (Losartan potassium) .Marland Kitchen... Take 1 tablet by mouth once a day    Aspirin 81 Mg Tbec (Aspirin) .Marland Kitchen... Take 1 tablet by mouth once a day    Metformin Hcl 1000 Mg Tabs (Metformin hcl) .Marland Kitchen... Take 1 tablet by mouth two times a day    Glipizide 5 Mg Tb24 (Glipizide) .Marland Kitchen... Take 1 tablet by mouth once a day  Orders: T- Capillary Blood Glucose (82948) T-Hgb A1C (in-house) ([id number])   Problem # 3:  EAR PAIN, BILATERAL (ICD-388.70) Likely combination of seasonal allergies/sinusitis, and the fact that she puts objects in her ears!  Recommendations made as far as hygiene. The following medications were removed from the medication list:    E.e.s. 400 400 Mg Tabs (Erythromycin ethylsuccinate) .Marland Kitchen... Take 1 tablet by mouth four times a day   Problem # 4:  ABDOMINAL PAIN (ICD-789.00) Likely bengn, maybe muscular pain?  vs IBS? Labs normal, ultrasound normal.   Pain changes in location.  Will have screening colonoscopy.  Problem # 5:  Preventive Health Care (ICD-V70.0) Screenign colonoscopy scheduled.  Problem # 6:  VAGINITIS (ICD-616.10) Will treat with fluconazole X 1.  Likely yeast.  Complete Medication List: 1)  Hydrochlorothiazide 25 Mg Tabs (Hydrochlorothiazide) .... Take 1 tablet by mouth once a day 2)  Cozaar 100 Mg Tabs (Losartan potassium) .... Take 1 tablet by mouth once a day 3)  Aspirin  81 Mg Tbec (Aspirin) .... Take 1 tablet by mouth once a day 4)  Metformin Hcl 1000 Mg Tabs (Metformin hcl) .... Take 1 tablet by mouth two times a day 5)  Nicoderm Cq 14 Mg/24hr Pt24 (Nicotine) .... Apply one patch daily 6)  Tylenol 8 Hour 650 Mg Tbcr (Acetaminophen) .... Take 1 tablet by mouth three times a day 7)  Premarin 0.625 Mg/gm Crea (Estrogens, conjugated) .... Apply 0.5 mg intravaginally daily 8)  Protonix 40 Mg Pack (Pantoprazole sodium) .... Take one tablet by mouth daily 9)  Allermax 25 Mg Tabs (Diphenhydramine hcl) .... One tablet every 12 hours as needed for nasal congestion. 10)  Hydroxyzine Hcl 25 Mg Tabs (Hydroxyzine hcl) .... Take one tab once every 6-8 hours as needed for itching.  do not take with benadryl 11)  Eucerin Lotn (Emollient) .... Apply to affected area once every four hours as needed for itching 12)  Fluconazole 150 Mg Tabs (Fluconazole) .... Take 1 tablet by mouth once a day x 1 13)  Amlodipine Besylate 5 Mg Tabs (Amlodipine besylate) .... Take 1 tablet by mouth once a day 14)  Glipizide 5 Mg Tb24 (Glipizide) .... Take 1 tablet by mouth once a day  Other Orders: Gastroenterology Referral (GI)   Patient Instructions: 1)  Please schedule a follow-up appointment in 1 month. 2)  See your eye doctor yearly to check for diabetic eye damage. 3)  Check your feet each night for sore areas, calluses or signs of infection.    Prescriptions: GLIPIZIDE 5 MG  TB24 (GLIPIZIDE) Take 1 tablet by mouth once a day  #30 x 12    Entered and Authorized by:   Dellia Beckwith MD   Signed by:   Dellia Beckwith MD on 12/02/2007   Method used:   Print then Give to Patient   RxID:   [id number] METFORMIN HCL 1000 MG  TABS (METFORMIN HCL) Take 1 tablet by mouth two times a day  #60 x 12   Entered and Authorized by:   Dellia Beckwith MD   Signed by:   Dellia Beckwith MD on 12/02/2007   Method used:   Print then Give to Patient   RxID:   [id number] AMLODIPINE BESYLATE 5 MG  TABS (AMLODIPINE BESYLATE) Take 1 tablet by mouth once a day  #30 x 12   Entered and Authorized by:   Dellia Beckwith MD   Signed by:   Dellia Beckwith MD on 12/02/2007   Method used:   Print then Give to Patient   RxID:   [id number] FLUCONAZOLE 150 MG  TABS (FLUCONAZOLE) Take 1 tablet by mouth once a day X 1  #1 x 0   Entered and Authorized by:   Dellia Beckwith MD   Signed by:   Dellia Beckwith MD on 12/02/2007   Method used:   Print then Give to Patient   RxID:   [id number]  ] Laboratory Results   Blood Tests   Date/Time Recieved: December 02, 2007 9:56 AM  Date/Time Reported: ..................................................................Marland KitchenOren Beckmann  December 02, 2007 9:56 AM    HGBA1C: 8.1%   (Normal Range: Non-Diabetic - 3-6%   Control Diabetic - 6-8%) CBG Random: 289     Appended Document: FU/EST/VS Patietn complained of insomnia.Ambien didn't work.  I will try amitriptylline.

## 2010-11-07 NOTE — Progress Notes (Signed)
Summary: phone/gg  Phone Note Call from Patient   Caller: Patient Summary of Call: Pt c/o itching all over body x 2 weeks on and off.   She has taken bendryl without relief.  denies rash, no new meds,lotions, detergents. Please advise Pt # [phone] Initial call taken by: Merrie Roof RN,  May 27, 2008 4:57 PM  Follow-up for Phone Call        Please schedule appointment in [facility] as soon as possible. Follow-up by: Margarito Liner MD,  May 27, 2008 5:16 PM  Additional Follow-up for Phone Call Additional follow up Details #1::        scheduled and pt infomred Additional Follow-up by: Merrie Roof RN,  May 31, 2008 3:35 PM

## 2010-11-07 NOTE — Letter (Signed)
Summary: Handout Printed  Printed Handout:  - *Patient Instructions  Appended Document: results of CBG and HGB A1C    Lab Visit   Laboratory Results   Blood Tests   Date/Time Received: August 15, 2007 11:13 AM  Date/Time Reported: ..................................................................Marland KitchenVickie Cole  August 15, 2007 11:13 AM   HGBA1C: 7.5%   (Normal Range: Non-Diabetic - 3-6%   Control Diabetic - 6-8%) CBG Random:: 194     Orders Today:

## 2010-11-07 NOTE — Progress Notes (Signed)
Summary: rtc/ hla  Phone Note Call from Patient   Summary of Call: pt called, wanted to speak to md on voicemail, rtc, got pt's voicemail, left message for rtc. pt returned call, wants diflucan Initial call taken by: Marin Roberts RN,  May 23, 2010 3:59 PM

## 2010-11-07 NOTE — Assessment & Plan Note (Signed)
Summary: vaginal itching, foot numb x1 month/gg   Vital Signs:  Patient Profile:   55 Years Old Female Height:     67 inches (170.18 cm) Weight:      193.3 pounds (87.86 kg) BMI:     30.38 Temp:     97.1 degrees F (36.17 degrees C) oral Pulse rate:   86 / minute BP sitting:   140 / 86  (left arm)  Pt. in pain?   yes    Location:   LEFTFOOT    Intensity:   8    Type:       ALLST  Vitals Entered By: Chinita Pester RN (June 17, 2007 9:57 AM)              Is Patient Diabetic? Yes  Nutritional Status NORMAL CBG Result 236  Does patient need assistance? Functional Status Self care Ambulation Normal     PCP:  Bridget Beckwith MD  Chief Complaint:  PAIN / NUMBNESS IN LEFT FOOT.  History of Present Illness: Bridget Owens is a 55 y/o woman patient of Dr.Duguay, who is here today complaining of vaginal itching that began 3 days ago. Also complaining of numbness and tingling of bilateral lower extremities for a few months.  Current Allergies: ! PCN ! CODEINE ! VICODIN    Risk Factors: Tobacco use:  current    Year started:  at the age of 39    Cigarettes:  Yes -- 1/2 pack(s) per day Alcohol use:  no Exercise:  yes    Times per week:  2    Type:  walkiing Seatbelt use:  100 %  Mammogram History:    Date of Last Mammogram:  01/02/2006  PAP Smear History:    Date of Last PAP Smear:  01/07/2004   Review of Systems  The patient denies fever, weight loss, chest pain, dyspnea on exhertion, abdominal pain, melena, and hematochezia.     Physical Exam  General:     Well-developed,well-nourished,in no acute distress; alert,appropriate and cooperative throughout examination Neck:     No bruits. Lungs:     Normal respiratory effort, chest expands symmetrically. Lungs are clear to auscultation, no crackles or wheezes. Heart:     Normal rate and regular rhythm. S1 and S2 normal without gallop, murmur, click, rub or other extra sounds. Genitalia:     Normal  introitus for age, no external lesions,, mucosa pink and moist, no vaginal or cervical lesions, no vaginal atrophy, no friaility or hemorrhage, normal uterus size and position, no adnexal masses or tenderness. Large amount of white vaginal discharge. Extremities:     No clubbing, cyanosis, edema.    Impression & Recommendations:  Problem # 1:  PRURITUS, VAGINAL (ICD-698.1) Wet prep and pap done today. Discharge does not have the typical appearance of a yeast infection, for that reason will refrain on prescribing any medications today and wait for results of wet prep.  Orders: T-Wet Prep (in-house) (16109UE) T-Pap Smear, Thin Prep (A5409)   Problem # 2:  NUMBNESS (ICD-782.0) Of both feet. Most likely 2/2 diabetic neuropathy. Will start on neurontin 300 mg at bedtime and can increase dose in the future if needed.  Problem # 3:  HEALTH MAINTENANCE EXAM (ICD-V70.0) PAP smear done today.  Complete Medication List: 1)  Hydrochlorothiazide 25 Mg Tabs (Hydrochlorothiazide) .... Take 1 tablet by mouth once a day 2)  Cozaar 100 Mg Tabs (Losartan potassium) .... Take 1 tablet by mouth once a day 3)  Aspirin 81  Mg Tbec (Aspirin) .... Take 1 tablet by mouth once a day 4)  Metformin Hcl 500 Mg Tabs (Metformin hcl) .... Take 1 tablet by mouth two times a day 5)  Nicoderm Cq 14 Mg/24hr Pt24 (Nicotine) .... Apply one patch daily 6)  Tylenol 8 Hour 650 Mg Tbcr (Acetaminophen) .... Take 1 tablet by mouth three times a day 7)  Cyclobenzaprine Hcl 10 Mg Tabs (Cyclobenzaprine hcl) .... Take 1 tablet by mouth every 8 hours as needed for muscle spasm 8)  Premarin 0.625 Mg/gm Crea (Estrogens, conjugated) .... Apply 0.5 mg intravaginally daily 9)  E.e.s. 400 400 Mg Tabs (Erythromycin ethylsuccinate) .... Take 1 tablet by mouth four times a day 10)  Protonix 40 Mg Pack (Pantoprazole sodium) .... Take one tablet by mouth daily 11)  Neurontin 300 Mg Caps (Gabapentin) .... Take 1 tab by mouth at bedtime  Other  Orders: Capillary Blood Glucose (16109) Fingerstick ([id number])   Patient Instructions: 1)  Please schedule a follow-up appointment in 1 month. 2)  We will call you and let you know the results of your pelvic exam today. 3)  Take the neurontin 1 tablet at bedtime.    Prescriptions: NEURONTIN 300 MG  CAPS (GABAPENTIN) Take 1 tab by mouth at bedtime  #31 x 0   Entered and Authorized by:   Peggye Pitt MD   Signed by:   Peggye Pitt MD on 06/17/2007   Method used:   Electronically sent to ...       [facility] [address]       [address] [place], [place]  [address]       Ph: [phone]       Fax: [phone]   RxID:   [id number]

## 2010-11-07 NOTE — Progress Notes (Signed)
Summary: Vag itching  Phone Note Call from Patient   Caller: Patient Call For: Dr Andrey Campanile Summary of Call: C/o of vag itching since last appt with Dr Andrey Campanile. Talked with Dr Andrey Campanile and will send Rx to [facility]. Initial call taken by: Stanton Kidney Ditzler RN,  December 29, 2009 11:34 AM    New/Updated Medications: FLUCONAZOLE 150 MG TABS (FLUCONAZOLE) Take 1 tablet by mouth once for yeast infection. Prescriptions: FLUCONAZOLE 150 MG TABS (FLUCONAZOLE) Take 1 tablet by mouth once for yeast infection.  #1 x 1   Entered and Authorized by:   Joaquin Courts  MD   Signed by:   Joaquin Courts  MD on 12/29/2009   Method used:   Electronically to        [facility] [id number] (retail)       [address] [place], [place]  [address]       Ph: [phone]       Fax: [phone]   RxID:   [id number]

## 2010-11-07 NOTE — Progress Notes (Signed)
Summary: refill/gg  Phone Note Refill Request  on March 26, 2007 3:34 PM  Refills Requested: Medication #1:  METFORMIN HCL 500 MG TABS Take 1 tablet by mouth two times a day   Last Refilled: 01/16/2007 bmet 6/08  Initial call taken by: Merrie Roof RN,  March 26, 2007 3:35 PM  Follow-up for Phone Call        Refill approved-nurse to complete Follow-up by: Duncan Dull MD,  March 26, 2007 3:39 PM  Additional Follow-up for Phone Call Additional follow up Details #1::        Rx faxed to pharmacy Additional Follow-up by: Merrie Roof RN,  March 26, 2007 3:46 PM    Prescriptions: METFORMIN HCL 500 MG TABS (METFORMIN HCL) Take 1 tablet by mouth two times a day  #62 x 5   Entered and Authorized by:   Duncan Dull MD   Signed by:   Duncan Dull MD on 03/26/2007   Method used:   Telephoned to ...       [facility] [address]       [address] [place], [place]  [address]       Ph: [phone]       Fax: [phone]   RxID:   [id number]

## 2010-11-07 NOTE — Assessment & Plan Note (Signed)
Summary: FU VISIT/DS   Vital Signs:  Patient Profile:   55 Years Old Female Height:     67 inches (170.18 cm) Weight:      192.2 pounds (87.36 kg) BMI:     30.21 Temp:     97.5 degrees F (36.39 degrees C) oral Pulse rate:   77 / minute BP sitting:   158 / 94  (right arm)  Pt. in pain?   yes    Location:   left leg    Intensity:   8  Vitals Entered By: Stanton Kidney Ditzler RN (March 18, 2007 9:28 AM)              Is Patient Diabetic? Yes  Nutritional Status BMI of > 30 = obese CBG Result 162  Have you ever been in a relationship where you felt threatened, hurt or afraid?denies   Does patient need assistance? Functional Status Self care Ambulation Normal   Chief Complaint:  For past 2 months rectal itching and left leg hurts- getting worse. Bad h/a with Ambien.Marland Kitchen  History of Present Illness: 55 y/o F who presents today with the c/o of L thigh and knee pain, non-specific dist, for the past 2+ months. numbness around the entire knee, ant and posterior. pt cannot pinpoint the pain exactly.  she has not had much relief with anything she has tried. she was seen on the last visit for a pain in her R leg, which is now resolved. she has increased her amount of walking recently. she claims she continues to be well hydrated and drinks more than a gallon of water a day.    Current Allergies (reviewed today): ! PCN ! CODEINE ! VICODIN    Risk Factors:  Tobacco use:  current    Cigarettes:  Yes -- 1 pack(s) per day  Mammogram History:    Date of Last Mammogram:  01/02/2006  PAP Smear History:    Date of Last PAP Smear:  01/07/2004   Review of Systems      See HPI  The patient denies anorexia, fever, weight loss, hoarseness, chest pain, syncope, dyspnea on exhertion, prolonged cough, hemoptysis, melena, and severe indigestion/heartburn.    General      See HPI      Denies chills, fatigue, fever, loss of appetite, malaise, sleep disorder, sweats, weakness, and weight  loss.  CV      Denies chest pain or discomfort.  Resp      Denies chest discomfort, cough, and shortness of breath.  GI      Complains of hemorrhoids.      Denies abdominal pain, bloody stools, change in bowel habits, constipation, dark tarry stools, diarrhea, indigestion, loss of appetite, nausea, and vomiting.  MS      Complains of joint pain.      Denies joint redness, joint swelling, loss of strength, mid back pain, muscle aches, and muscle weakness.  Neuro      Complains of headaches and tingling.      Denies falling down, poor balance, seizures, and weakness.   Physical Exam  General:     Well-developed,well-nourished,in no acute distress; alert,appropriate and cooperative throughout examination Head:     normocephalic and atraumatic.   Eyes:     vision grossly intact.   Ears:     no external deformities.   Nose:     no external deformity.   Mouth:     poor dentition.   Lungs:     Normal respiratory effort,  chest expands symmetrically. Lungs are clear to auscultation, no crackles or wheezes. Heart:     Normal rate and regular rhythm. S1 and S2 normal without gallop, murmur, click, rub or other extra sounds. Abdomen:     soft, non-tender, and normal bowel sounds.     Knee Exam  General:    Well-developed, well-nourished, in no acute distress; alert and oriented x 3.    Gait:    Normal heel-toe gait pattern bilaterally.    Skin:    Intact with no erythema; no scarring.    Inspection:    plantigrade foot with normal alignment of leg, ankle, hindfoot, and foot.   Palpation:    tenderness R-Parapatellar.    Vascular:    dorsalis pedis and posterior tibial pulses 2+ and symmetric, capillary refill < 2 seconds, normal hair pattern, no evidence of ischemia.   Sensory:    gross sensation intact bilaterally in lower extremities.    Motor:    Motor strength 5/5 bilaterally for quadriceps, hamstrings, ankle dorsiflexion, and ankle plantar flexion.     Reflexes:    Normal and symmetric patellar and Achilles reflexes bilaterally.    Knee Exam:    Right:    Inspection:  Normal    Palpation:  Normal    Stability:  stable    Tenderness:  no    Swelling:  no    Erythema:  no    Range of Motion:       Flexion-Active: full       Extension-Active: full       Flexion-Passive: full       Extension-Passive: full    Left:    Inspection:  Normal    Palpation:  Normal    Stability:  stable    Tenderness:  patellar    Swelling:  no    Erythema:  no    Range of Motion:       Flexion-Active: full       Extension-Active: full       Flexion-Passive: full       Extension-Passive: full    Impression & Recommendations:  Problem # 1:  LEG PAIN, LEFT (ICD-729.5) Assessment: Unchanged present for 2 months.  very non-specific.  no neurologic impairment detectable.  phys exam and h/o appear most c/w muscle cramping or irritation, which i suspect may be due to dehydration or increase walking.  we will check a BMET at this time  on further review of her paper chart she has been c/o of this non-specific knee/thigh pain, in varying legs, for several years. she is not reporting Sx in her feet and toes,so I doubt this is a diabetic neuropathy issue.  there is a possibility of some nerve impingement in her lower back, however her pain and some tingling are not distributed in any specific dermatome.  this appears to be a benign MSK issue and I will give her a trial   Orders: T-Basic Metabolic Panel [id number] [id number])   Problem # 2:  EXTERNAL HEMORRHOIDS (ICD-455.3) complaining of itching with BM last several months. no other sx. she has been applying witchhazel to the rectal area, and i told her to stop this as it may be causing imflammation  Orders: T-Hemoccult Card-Multiple (take home) ([id number])   Medications Added to Medication List This Visit: 1)  Naprosyn 375 Mg Tabs (Naproxen) .... Take 1 tablet by mouth two times a day  Complete  Medication List: 1)  Hydrochlorothiazide 25 Mg Tabs (Hydrochlorothiazide) .... Take 1 tablet by  mouth once a day 2)  Cozaar 100 Mg Tabs (Losartan potassium) .... Take 1 tablet by mouth once a day 3)  Aspirin 81 Mg Tbec (Aspirin) .... Take 1 tablet by mouth once a day 4)  Metformin Hcl 500 Mg Tabs (Metformin hcl) .... Take 1 tablet by mouth two times a day 5)  Nicoderm Cq 14 Mg/24hr Pt24 (Nicotine) .... Apply one patch daily 6)  Naprosyn 375 Mg Tabs (Naproxen) .... Take 1 tablet by mouth two times a day  Other Orders: Capillary Blood Glucose (16109) Fingerstick ([id number])   Patient Instructions: 1)  Please schedule an appointment with your primary doctor in : 1-2 months 2)  try the naproxen for pain relief of your muscle cramps 3)  use the stool cards to check for blood.  Appended Document: FU VISIT/DS pt's Bp elevated today as she was not able to obtain her BP med refills yet (due to medicaid?) she said she is going to pick up the meds today.

## 2010-11-07 NOTE — Assessment & Plan Note (Signed)
Summary: EST-CK/FU/MEDS/CFB   Vital Signs:  Patient Profile:   55 Years Old Female Height:     67 inches (170.18 cm) Weight:      187.7 pounds (85.32 kg) BMI:     29.50 Temp:     97.0 degrees F (36.11 degrees C) oral Pulse rate:   86 / minute BP sitting:   145 / 88  (right arm) Cuff size:   regular  Pt. in pain?   no  Vitals Entered By: Krystal Eaton Duncan Dull) (October 13, 2008 1:36 PM)              Is Patient Diabetic? Yes Did you bring your meter with you today? No Nutritional Status BMI of 25 - 29 = overweight CBG Result 186  Have you ever been in a relationship where you felt threatened, hurt or afraid?No   Does patient need assistance? Functional Status Self care Ambulation Normal     PCP:  Blondell Reveal MD  Chief Complaint:  c/o itching in vaginal area started Christmas day.  History of Present Illness: 55 yo lady with PMH of  DM-II, HTN, HLD comes to the clinic with CC of itching in her genitalia over the last 10-14 days. Her complaints were insidious in onset and gradually worsening. She reports that the pain is deep inside her vagina. She denies any discharge, bleeding from vagina, itching any where else on the body, N/V, fever, abdominal pain. She is singlebut has an active relationship with her boyfriend and nobody else.  She denies any dysuria, urinary frequency, foul smelling urine. She denies any other complaints. She attained menopause 4 years back and since then she has every now and then spottings, but not really menstruations.     Prior Medications Reviewed Using: Patient Recall  Current Allergies (reviewed today): ! CODEINE ! VICODIN ! DOXYCYCLINE HYCLATE (DOXYCYCLINE HYCLATE)  Past Medical History:    Reviewed history from 04/22/2007 and no changes required:       Diabetes mellitus, type II       Hypertension       Headache       Tobacco use       Hyperlipidemia       Allergic rhinitis       Hx leg cramps on Lipitor       GERD  Uterine fibroids       Hx chest pain : exercise stress test negative 06-07       Perimenopausal       Insomnia       External hemorrhoids       Menopause       Atrophic vaginitis   Family History:    Reviewed history from 08/31/2008 and no changes required:       M: died with bad cough 1969 @ 57yo, not sure about health history       S: died with bad cough 1996 @ 55yo, of pneumonia  Social History:    Reviewed history from 11/12/2006 and no changes required:       Current Smoker   Risk Factors: Tobacco use:  current    Year started:  at the age of 52    Cigarettes:  Yes -- 1-2 cigs daily pack(s) per day Passive smoke exposure:  yes Alcohol use:  no Exercise:  no Seatbelt use:  100 %  Colonoscopy History:    Date of Last Colonoscopy:  03/22/2008  Mammogram History:    Date of Last Mammogram:  03/04/2008  PAP Smear History:    Date of Last PAP Smear:  02/24/2008   Review of Systems      See HPI   Physical Exam  General:     Well-developed,well-nourished,in no acute distress; alert,appropriate and cooperative throughout examination Head:     Normocephalic and atraumatic without obvious abnormalities. No apparent alopecia or balding. Mouth:     Oral mucosa and oropharynx without lesions or exudates.  Teeth in good repair. Neck:     No deformities, masses, or tenderness noted. Lungs:     Normal respiratory effort, chest expands symmetrically. Lungs are clear to auscultation, no crackles or wheezes. Heart:     Normal rate and regular rhythm. S1 and S2 normal without gallop, murmur, click, rub or other extra sounds. Abdomen:     Bowel sounds positive,abdomen soft and non-tender without masses, organomegaly or hernias noted. Pulses:     R radial normal.   Extremities:     No clubbing, cyanosis, edema, or deformity noted with normal full range of motion of all joints.   Neurologic:     alert & oriented X3, strength normal in all extremities, and gait normal.    Additional Exam:     Pelvic and colposcopic exam: Genitalia appear atrophic with no signs of inflammation or erythema. White discharge noted with no foul smelling. No blood  is noted.    Impression & Recommendations:  Problem # 1:  VAGINAL PRURITUS (ICD-698.1) Likely differentials include Candida vaginitis, trichomonas, or a gardenella vs atrophic vagina from postmenopausal changes. Colposcopic exam showed white discharge but it doesn't look like an yeast infected. We will get a wet prep and follow up on the labs and will not treat her until then.   Orders: T-Wet Prep for Christoper Allegra, [facility] [id number])   Problem # 2:  HYPERTENSION (ICD-401.9) Slightly elevated. No changes made during this visit. If it continues to be elevated, she might benefit from increasing amlodipine component in her caduet. Will follow up.  Her updated medication list for this problem includes:    Hyzaar 100-25 Mg Tabs (Losartan potassium-hctz) .Marland Kitchen... Take 1 tablet by mouth once a day    Caduet 5-20 Mg Tabs (Amlodipine-atorvastatin) .Marland Kitchen... Take 1 tablet by mouth once a day    Catapres 0.2 Mg Tabs (Clonidine hcl) .Marland Kitchen... Take 1 pill once daily at bed time.  BP today: 145/88 Prior BP: 147/85 (09/21/2008)  Labs Reviewed: Creat: 0.75 (08/31/2008) Chol: 219 (06/28/2008)   HDL: 46 (06/28/2008)   LDL: 149 (06/28/2008)   TG: 120 (06/28/2008)   Problem # 3:  DYSLIPIDEMIA (ICD-272.4) She needs a repeat LDL. No changes made during this visit.  Her updated medication list for this problem includes:    Caduet 5-20 Mg Tabs (Amlodipine-atorvastatin) .Marland Kitchen... Take 1 tablet by mouth once a day  Labs Reviewed: Chol: 219 (06/28/2008)   HDL: 46 (06/28/2008)   LDL: 149 (06/28/2008)   TG: 120 (06/28/2008) SGOT: 18 (08/31/2008)   SGPT: 27 (08/31/2008)   Complete Medication List: 1)  Hyzaar 100-25 Mg Tabs (Losartan potassium-hctz) .... Take 1 tablet by mouth once a day 2)  Aspirin 81 Mg Tbec (Aspirin) .... Take 1 tablet by  mouth once a day 3)  Janumet 50-500 Mg Tabs (Sitagliptin-metformin hcl) .... Take 1 tablet by mouth two times a day 4)  Tylenol 8 Hour 650 Mg Tbcr (Acetaminophen) .... Take 1 tablet by mouth three times a day 5)  Protonix 40 Mg Pack (Pantoprazole sodium) .... Take one tablet by mouth daily  6)  Caduet 5-20 Mg Tabs (Amlodipine-atorvastatin) .... Take 1 tablet by mouth once a day 7)  Amitriptyline Hcl 25 Mg Tabs (Amitriptyline hcl) .... Take 1 tablet by mouth at bedtime 8)  Freestyle Lite Test Strp (Glucose blood) .... Use to test blood glucsoe once time a day 9)  Lancets Misc (Lancets) .... Use to test blood glucose one time a day 10)  Freestyle Control Solution Liqd (Blood glucose calibration) .... Use to calibrate meter and assure quality readings. 11)  Catapres 0.2 Mg Tabs (Clonidine hcl) .... Take 1 pill once daily at bed time. 12)  Loratadine 10 Mg Tabs (Loratadine) .... Take 1 tablet by mouth once a day 13)  Guaifenesin-codeine 200-10 Mg/34ml Liqd (Guaifenesin-codeine) .... Take 5-82ml by mouth up to every 6 hours as needed for cough 14)  Protonix 40 Mg Pack (Pantoprazole sodium) .... Take 1 tablet by mouth two times a day  Other Orders: Capillary Blood Glucose ([id number]) Fingerstick ([id number])   Patient Instructions: 1)  Please schedule a follow-up appointment in 2 weeks. 2)  I will call you if any of the test results come back abnormal and will give you electronic prescription accordingly. 3)  Take all the medications as advised before. 4)  Take Clonidine 0.2 mg 1 pill once daily at bed time. Note that we increased your clonidine from 0.1 mg to 0.2 mg.   ]

## 2010-11-07 NOTE — Assessment & Plan Note (Signed)
Summary: coughing , lost weight[mkj]   Vital Signs:  Patient Profile:   55 Years Old Female Height:     67 inches (170.18 cm) Weight:      187.1 pounds (85.05 kg) BMI:     29.41 Temp:     97.0 degrees F (36.11 degrees C) oral Pulse rate:   88 / minute BP sitting:   147 / 85  (right arm)  Pt. in pain?   yes    Location:   lt side    Intensity:   9    Type:       "tender"  Vitals Entered By: Chinita Pester RN (September 21, 2008 2:21 PM)              Is Patient Diabetic? Yes Did you bring your meter with you today? No Nutritional Status BMI of 25 - 29 = overweight CBG Result 220  Have you ever been in a relationship where you felt threatened, hurt or afraid?No   Does patient need assistance? Functional Status Self care Ambulation Normal     PCP:  Blondell Reveal MD  Chief Complaint:  Continous  cough since August; left side pain; no energy.  History of Present Illness: Pt is 55 yo smoker w/ DMII, HTN, HLD who has had a persistent cough since August. Pt presents today for continued cough and pain while coughing. Last seen by Dr. Janyth Pupa on 11/24.   Since last visit, cough had improved 1-2 weeks ago, but has returned to pre-last visit level. Had been experiencing white sputum, but has noticed that it has been green tinged lately. This past saturday, pt had noticed some red discoloration to sputum.  Pt also reports a pain in right side associated with coughing. The pain is aggravated by inspiration.  On last visit, pt was switched from lisinopril to losartan. Pharmacy called to verify. Had CXR on 11/24 that showed COPD, nothing acute. Had PPD placed and non-reactive and also had sputum collected. Smokes 2-3 cigarrettes/ day. Weight has been stable since July, within 3 pounds. Pt supposed to have been on protonix, but had not been taking this as prescribed.   Had polyps on colonoscopy in 6/09, s/p resection.    Current Allergies: ! CODEINE ! VICODIN ! DOXYCYCLINE HYCLATE  (DOXYCYCLINE HYCLATE)    Risk Factors:  Tobacco use:  current    Year started:  at the age of 3    Cigarettes:  Yes -- 1-2 cigs daily pack(s) per day    Counseled to quit/cut down tobacco use:  yes Passive smoke exposure:  yes Alcohol use:  no Exercise:  no Seatbelt use:  100 %  Colonoscopy History:    Date of Last Colonoscopy:  03/22/2008  Mammogram History:    Date of Last Mammogram:  03/04/2008  PAP Smear History:    Date of Last PAP Smear:  02/24/2008    Physical Exam  General:     alert.  Mild dry cough during exam. Pt smells like smoke. Nose:     No nasal bogginess, congestion Mouth:     No oropharyngeal erythema or exudate Lungs:     Completely clear to auscultation. No wheezes even with forced expiration. No crackles, no rhonchi. Heart:     normal rate, regular rhythm, no murmur, no gallop, and no rub.   Abdomen:     soft and non-tender.      Impression & Recommendations:  Problem # 1:  COUGH (ICD-786.2) Had CXR on 11/24 that showed  no acute pathology, but did show changes c/w COPD. Pt still smokes 2-3 cigarettes/ day, had cut back from 10 cigarrettes/ day. Appears that cough may be related to chronic smoking. Pt does report some post-nasal drip that may be exacerbating cough, but pt is on loratadine and confirmed with pharmacy and this should reduce congestion, along with guaifenisin in cough syrup. Pt had not been taking Protonix and pt has hx/o reflux. Possible that chronic cough is related to GERD, so will restart high dose protonix (40 mg two times a day) and check back with patient in one month. Mild concern over blood tinged sputum, but given that it only occured once and patient does not have any CXR findings or concerning weight loss, likely just 2/2 chronic irritation of cough.   Will continue symptomatic codeine - guaifenisin.  Problem # 2:  HYPERTENSION (ICD-401.9) Pt blood pressure above goal. Pharmacy called to verift that pt on Hyzaar instead  of lisinopril combo. Pt already on quadruple therapy (ARB, CCB, HCTZ, and clonidine). Will continue to monitor and consider increasing norvasc, clonidine at next appt. Since pt recently switched to ARB, will F/U BP at this point.  Her updated medication list for this problem includes:    Hyzaar 100-25 Mg Tabs (Losartan potassium-hctz) .Marland Kitchen... Take 1 tablet by mouth once a day    Caduet 5-20 Mg Tabs (Amlodipine-atorvastatin) .Marland Kitchen... Take 1 tablet by mouth once a day    Clonidine Hcl 0.1 Mg Tabs (Clonidine hcl) .Marland Kitchen... Take 1 tablet by mouth at bedtime  BP today: 147/85 Prior BP: 126/81 (08/31/2008)  Labs Reviewed: Creat: 0.75 (08/31/2008) Chol: 219 (06/28/2008)   HDL: 46 (06/28/2008)   LDL: 149 (06/28/2008)   TG: 120 (06/28/2008)   Complete Medication List: 1)  Hyzaar 100-25 Mg Tabs (Losartan potassium-hctz) .... Take 1 tablet by mouth once a day 2)  Aspirin 81 Mg Tbec (Aspirin) .... Take 1 tablet by mouth once a day 3)  Janumet 50-500 Mg Tabs (Sitagliptin-metformin hcl) .... Take 1 tablet by mouth two times a day 4)  Tylenol 8 Hour 650 Mg Tbcr (Acetaminophen) .... Take 1 tablet by mouth three times a day 5)  Protonix 40 Mg Pack (Pantoprazole sodium) .... Take one tablet by mouth daily 6)  Caduet 5-20 Mg Tabs (Amlodipine-atorvastatin) .... Take 1 tablet by mouth once a day 7)  Amitriptyline Hcl 25 Mg Tabs (Amitriptyline hcl) .... Take 1 tablet by mouth at bedtime 8)  Freestyle Lite Test Strp (Glucose blood) .... Use to test blood glucsoe once time a day 9)  Lancets Misc (Lancets) .... Use to test blood glucose one time a day 10)  Freestyle Control Solution Liqd (Blood glucose calibration) .... Use to calibrate meter and assure quality readings. 11)  Clonidine Hcl 0.1 Mg Tabs (Clonidine hcl) .... Take 1 tablet by mouth at bedtime 12)  Loratadine 10 Mg Tabs (Loratadine) .... Take 1 tablet by mouth once a day 13)  Guaifenesin-codeine 200-10 Mg/70ml Liqd (Guaifenesin-codeine) .... Take 5-23ml by mouth  up to every 6 hours as needed for cough 14)  Protonix 40 Mg Pack (Pantoprazole sodium) .... Take 1 tablet by mouth two times a day  Other Orders: T-Hgb A1C (in-house) ([id number]) T- Capillary Blood Glucose ([id number])   Patient Instructions: 1)  Please schedule a follow-up appointment in 1 month. 2)  Please quit smoking. Your cough is likely related to your continued smoking. 3)  Please take your medications as prescribed. We are giving you a prescription for protonix which will help  with acid reflux.  4)  This may be the cause of your cough. You are also taking Loratadine (Allegra) for allergies. Please also continue to use the codeine cough syrup to help with the symptoms.   Prescriptions: PROTONIX 40 MG PACK (PANTOPRAZOLE SODIUM) Take 1 tablet by mouth two times a day  #60 x 2   Entered and Authorized by:   Linward Foster MD   Signed by:   Linward Foster MD on 09/21/2008   Method used:   Print then Give to Patient   RxID:   [id number]  ] Laboratory Results   Blood Tests   Date/Time Received: September 21, 2008 2:44 PM  Date/Time Reported: Oren Beckmann  September 21, 2008 2:44 PM   HGBA1C: 6.9%   (Normal Range: Non-Diabetic - 3-6%   Control Diabetic - 6-8%) CBG Random:: 220mg /dL

## 2010-11-07 NOTE — Assessment & Plan Note (Signed)
Summary: BAD COLD / SB.   Vital Signs:  Patient Profile:   55 Years Old Female Height:     67 inches (170.18 cm) Weight:      186.5 pounds (84.77 kg) BMI:     29.32 O2 Sat:      99 % O2 treatment:    Room Air Temp:     97.0 degrees F (36.11 degrees C) oral Pulse rate:   75 / minute BP sitting:   149 / 85  (right arm)  Pt. in pain?   yes    Location:   head    Intensity:   7  Vitals Entered By: Stanton Kidney Ditzler RN (March 23, 2008 3:19 PM)              Is Patient Diabetic? Yes  Nutritional Status BMI of 25 - 29 = overweight Nutritional Status Detail good CBG Result 99  Have you ever been in a relationship where you felt threatened, hurt or afraid?denies   Does patient need assistance? Functional Status Self care Ambulation Normal     PCP:  Dellia Beckwith MD  Chief Complaint:  White productive cough, head congestion, and ears popping and h/a..  History of Present Illness: Bridget Owens is a 55 yo woman with PMH as outlined in chart.  She is here for congestion.  Started for 1 1/2 weeks now.  Main problem is pressure in ears, popping and nasal congestion.  No fever, or chills.  She states some shortness of breath and chest pain associated with cough productive of white sputum.  Has had some nausea secondary to prep for colonoscopy done monday.  Clear rhinorrhea and headaches (top of head).  Will need to address other issues (DM, HTN, microalbumin/cr ratio, lipids, etc) during next visit.  Pt appears to be non compliant since she does not no her medications, and when told a wrong medication list (by mistake) she agreed to taking it.     Current Allergies (reviewed today): ! CODEINE ! VICODIN ! DOXYCYCLINE HYCLATE (DOXYCYCLINE HYCLATE)    Risk Factors:  Tobacco use:  current    Year started:  at the age of 55    Cigarettes:  Yes -- 1 pack(s) per day Passive smoke exposure:  yes Alcohol use:  no Exercise:  yes    Times per week:  2    Type:  walkiing Seatbelt  use:  100 %  Colonoscopy History:    Date of Last Colonoscopy:  01/06/2008  Mammogram History:    Date of Last Mammogram:  03/04/2008  PAP Smear History:    Date of Last PAP Smear:  02/24/2008   Review of Systems  General      Denies chills and fever.  Resp      Complains of chest discomfort, shortness of breath, and sputum productive.  GI      Denies abdominal pain, change in bowel habits, nausea, and vomiting.   Physical Exam  General:     alert, well-developed, well-nourished, and well-hydrated.   Head:     normocephalic and atraumatic.   Eyes:     pupils equal, pupils round, pupils reactive to light, and no injection.   Ears:     Bilateral TM opaque with mild erythema surrounding it. No erythema or exudates noted in canals.  Nose:     nasal dischargemucosal pallor.   Mouth:     pharynx pink and moist, no exudates, poor dentition, teeth missing, and postnasal drip.   Neck:  no carotid bruits and no cervical lymphadenopathy.   Lungs:     normal respiratory effort, normal breath sounds, no crackles, and no wheezes.   Heart:     normal rate, regular rhythm, no murmur, no gallop, and no rub.   Abdomen:     soft and normal bowel sounds.      Impression & Recommendations:  Problem # 1:  OTITIS MEDIA, PURULENT, ACUTE (ICD-382.00) Symptoms seem to be more related to seasonal allergies with possible sinusitis and otitis media.  Will treat with amoxicillin while also treating symptoms with nasonex, cetirizine, and tessalon perles.  Her updated medication list for this problem includes:    Aspirin 81 Mg Tbec (Aspirin) .Marland Kitchen... Take 1 tablet by mouth once a day    Tylenol 8 Hour 650 Mg Tbcr (Acetaminophen) .Marland Kitchen... Take 1 tablet by mouth three times a day    Amoxil 500 Mg Caps (Amoxicillin) .Marland Kitchen... Take 1 tablet by mouth three times a day   Problem # 2:  DIABETES MELLITUS, TYPE II (ICD-250.00) Hgb A1c not at goal, there is also no microalbumin/Cr ratio.  Doubt pt is  compliant for reason stated in HPI and hyperlipidemia.  Patient's compliance needs to be assesed.  BP and lipids not at goal either for diabetic patient.  Her updated medication list for this problem includes:    Cozaar 100 Mg Tabs (Losartan potassium) .Marland Kitchen... Take 1 tablet by mouth once a day    Aspirin 81 Mg Tbec (Aspirin) .Marland Kitchen... Take 1 tablet by mouth once a day    Metformin Hcl 500 Mg Tabs (Metformin hcl) .Marland Kitchen... Take 1 tablet by mouth two times a day  Orders: Capillary Blood Glucose ([id number]) Fingerstick ([id number])  Labs Reviewed: HgBA1c: 7.4 (02/24/2008)   Creat: 0.73 (10/29/2007)      Problem # 3:  DYSLIPIDEMIA (ICD-272.4) Patient not taking any cholesterol meds though LDL 150 in 2007 (nothing more recent in EMR, not sure if any in hospital records).  Pt's goal is <100, preferably 70.    Labs Reviewed: Chol: 222 (12/25/2005)   HDL: 56 (12/25/2005)   LDL: 150 (12/25/2005)   TG: 78 (12/25/2005) SGOT: 14 (10/29/2007)   SGPT: 20 (10/29/2007)   Problem # 4:  HYPERTENSION (ICD-401.9) BP not at goal either, goal <130/80.  Will need to assess compliance, if truely taking current regimen, will need increase in amlodipine to 10mg  daily.  Her updated medication list for this problem includes:    Hydrochlorothiazide 25 Mg Tabs (Hydrochlorothiazide) .Marland Kitchen... Take 1 tablet by mouth once a day    Cozaar 100 Mg Tabs (Losartan potassium) .Marland Kitchen... Take 1 tablet by mouth once a day    Amlodipine Besylate 5 Mg Tabs (Amlodipine besylate) .Marland Kitchen... Take 1 tablet by mouth once a day  BP today: 149/85 Prior BP: 146/87 (03/03/2008)  Labs Reviewed: Creat: 0.73 (10/29/2007) Chol: 222 (12/25/2005)   HDL: 56 (12/25/2005)   LDL: 150 (12/25/2005)   TG: 78 (12/25/2005)   Complete Medication List: 1)  Hydrochlorothiazide 25 Mg Tabs (Hydrochlorothiazide) .... Take 1 tablet by mouth once a day 2)  Cozaar 100 Mg Tabs (Losartan potassium) .... Take 1 tablet by mouth once a day 3)  Aspirin 81 Mg Tbec (Aspirin) .... Take 1  tablet by mouth once a day 4)  Metformin Hcl 500 Mg Tabs (Metformin hcl) .... Take 1 tablet by mouth two times a day 5)  Tylenol 8 Hour 650 Mg Tbcr (Acetaminophen) .... Take 1 tablet by mouth three times a day 6)  Protonix  40 Mg Pack (Pantoprazole sodium) .... Take one tablet by mouth daily 7)  Amlodipine Besylate 5 Mg Tabs (Amlodipine besylate) .... Take 1 tablet by mouth once a day 8)  Amitriptyline Hcl 25 Mg Tabs (Amitriptyline hcl) .... Take 1 tablet by mouth at bedtime 9)  Freestyle Lite Test Strp (Glucose blood) .... Use to test blood glucsoe once time a day 10)  Lancets Misc (Lancets) .... Use to test blood glucose one time a day 11)  Freestyle Control Solution Liqd (Blood glucose calibration) .... Use to calibrate meter and assure quality readings. 12)  Amoxil 500 Mg Caps (Amoxicillin) .... Take 1 tablet by mouth three times a day 13)  Nasonex 50 Mcg/act Susp (Mometasone furoate) .... Apply 2 sprays each nostril at bedtime. 14)  Cetirizine Hcl 10 Mg Tabs (Cetirizine hcl) .... Take 1 tablet by mouth once a day 15)  Tessalon Perles 100 Mg Caps (Benzonatate) .... Take 1 tablet by mouth three times a day   Patient Instructions: 1)  Please schedule a follow-up appointment in 1 month. 2)  All your medications were sent to [facility] [address], take them as directed (nasonex, cetirizine, tessalon perles, and amoxil). 3)  If any of your symptoms worsen, give clinic a call.    Prescriptions: TESSALON PERLES 100 MG  CAPS (BENZONATATE) Take 1 tablet by mouth three times a day  #30 x 0   Entered and Authorized by:   Mariea Stable MD   Signed by:   Mariea Stable MD on 03/23/2008   Method used:   Electronically sent to ...       [facility] [id number]       [address]       [place], [place]  [address]       Ph: [phone]       Fax: [phone]   RxID:   [id number] CETIRIZINE HCL 10 MG  TABS (CETIRIZINE HCL) Take 1 tablet by mouth once a day  #30 x 0    Entered and Authorized by:   Mariea Stable MD   Signed by:   Mariea Stable MD on 03/23/2008   Method used:   Electronically sent to ...       [facility] [address]       [address]       [place], [place]  [address]       Ph: [phone]       Fax: [phone]   RxID:   [id number] NASONEX 50 MCG/ACT  SUSP (MOMETASONE FUROATE) apply 2 sprays each nostril at bedtime.  #1 x 0   Entered and Authorized by:   Mariea Stable MD   Signed by:   Mariea Stable MD on 03/23/2008   Method used:   Electronically sent to ...       [facility] [address]       [address]       [place], [place]  [address]       Ph: [phone]       Fax: [phone]   RxID:   [id number] AMOXIL 500 MG  CAPS (AMOXICILLIN) Take 1 tablet by mouth three times a day  #30 x 0   Entered and Authorized by:   Mariea Stable MD   Signed by:   Mariea Stable MD on  03/23/2008   Method used:   Electronically sent to ...       [facility] [id number]       [address]       [place], [place]  [address]       Ph: [phone]       Fax: [phone]   RxID:   [id number]  ]

## 2010-11-07 NOTE — Assessment & Plan Note (Signed)
Summary: PAIN IN SIDE/ SB.   Vital Signs:  Patient Profile:   55 Years Old Female Height:     67 inches (170.18 cm) Weight:      188.1 pounds (85.50 kg) BMI:     29.57 Temp:     97.4 degrees F (36.33 degrees C) oral Pulse rate:   78 / minute BP sitting:   147 / 89  (right arm)  Pt. in pain?   yes    Location:   low  left abd    Intensity:   6  Vitals Entered By: Stanton Kidney Ditzler RN (October 29, 2007 10:06 AM)              Is Patient Diabetic? Yes  Nutritional Status BMI of 25 - 29 = overweight Nutritional Status Detail good CBG Result 196  Have you ever been in a relationship where you felt threatened, hurt or afraid?denies   Does patient need assistance? Functional Status Self care Ambulation Normal     PCP:  Dellia Beckwith MD  Chief Complaint:  Blood with intercourse 2-3 months, itching all over, not sleeping, and pain low left abd since 12/08 and pain with urination 1st urine AM only.Marland Kitchen  History of Present Illness: Bridget Owens has been complaining of hot flashes and diffuse itching all over her body.  The itching and a rash has been occuring on her legs, thighs, chest, and under her arms.  These bumps come and go, and she has been scratching profusely.  She states that she had a boil in between her legs a month ago, and she was started on an antibiotic that made the boil go away.  After taking the antibiotic she developed a rash with itching.  She stopped taking the antibiotic two weeks after christmas, though she is not sure of her dates.  The rash has come and gone, but she continues to itch.  She has not been having dry skin.   She has been having pain in her left side when she pees in the morning.  The pain is also suprapubic.  She feels a pushing pressure as she is peeing.  She has frequency, incomplete emptying, and no burning, or pressure.  She also has nocturia. She has post coital bleeding, but no bleeding at any other time.   She is complaining of pain and bleeding  with intercouse, in addition to occassional vaginal itching, but never started on her premarin. She wants a medication for hot flashes. She stopped taking lyrica and flexeril on her own. Her ears have intermittent fullness, but feel better after the amoxicillin.  Current Allergies (reviewed today): ! CODEINE ! VICODIN ! DOXYCYCLINE HYCLATE (DOXYCYCLINE HYCLATE)    Risk Factors:  Tobacco use:  current    Year started:  at the age of 1    Cigarettes:  Yes -- 1/2 pack(s) per day    Counseled to quit/cut down tobacco use:  yes Passive smoke exposure:  yes Alcohol use:  no Exercise:  yes    Times per week:  2    Type:  walkiing Seatbelt use:  100 %  Mammogram History:    Date of Last Mammogram:  01/02/2006  PAP Smear History:    Date of Last PAP Smear:  01/07/2004   Review of Systems      See HPI   Physical Exam  General:     alert and well-developed.   Head:     normocephalic and atraumatic.   Eyes:  vision grossly intact.   Ears:     R ear normal and L ear normal.  minimal amount of wax in the left ear Lungs:     normal respiratory effort, normal breath sounds, no crackles, and no wheezes.   Heart:     normal rate, regular rhythm, and no murmur.   Abdomen:     soft, non-tender, normal bowel sounds, no distention, no masses, no guarding, no rigidity, and no rebound tenderness.   Genitalia:     No CVA tenderness, no suprapubic tenderness Skin:     She had a faint maculopapular rash seen on her chest, minimally on her abdomen, and minimally on her back.  There were more excoriations than anything else, and I watched her dig a deep scratch into her back while I was talking to her.  There was no involvement of her hands, and no indications of superinfection or parasites. Psych:     Oriented X3, normally interactive, good eye contact, and not anxious appearing.   Her memory was not that good, and she had difficulty with dates, and answering some questions.  Her  answers to fairly simple questions were not consistent when asked the same question a few minutes later.    Impression & Recommendations:  Problem # 1:  VAGINITIS, ATROPHIC, SYMPTOMATIC (ICD-627.3) I advised her to start taking her premarin, as it will not work unless she applies it.  She agreed to this. Her updated medication list for this problem includes:    Premarin 0.625 Mg/gm Crea (Estrogens, conjugated) .Marland Kitchen... Apply 0.5 mg intravaginally daily  Orders: Ultrasound (Ultrasound)   Problem # 2:  PRURITUS (ICD-698.9) I have given her short course of atarax, as her benadryl is not working.  I also gave her a prescription for eucerin cream, as she has medicaid.  It is possible that this was an allergic reaction to doxycycline, as it may have began after she had been started on that medication, but she is not sure.  When first asked, she stated that her boyfriend was washing her clothes over the last month, and that was new, but while I was thinking that may be the cause i.e different detergent or bleach, her story changed to him doing the laundry for the past 5 years.  Either way, I informed her to make sure that the detergent that they were using 3 months ago was the same that they were using now, and to watch him do the laundry to make sure no specific things were added.  Problem # 3:  DYSURIA (ICD-788.1) Symptoms seem consistent with a UTI, but could also be secondary to her fibroids, or possibly something more sinister, considering she also has vague left lower quadrant fullness, and her symptoms are not always present.  With her recent possible reaction to antibiotics and her ever changing history, I would prefer to have a lab proven infection before I initiate treatment.  I will also obtain a transvaginal ultrasound to evaluate her fibroids and ovaries.  Her symptoms do not appear consistent with ovarian torsion as her pain is not intense, and associated only with urination.  Her vaginal  bleeding would be more concerning if it occurred when she was not having sex, but again, it is possible she is not describing her symptoms well, so I will feel more comfortable after obtaining an ultrasound. The following medications were removed from the medication list:    Amoxicillin 500 Mg Tabs (Amoxicillin) .Marland Kitchen... Take 1 tablet by mouth three times  a day  Her updated medication list for this problem includes:    E.e.s. 400 400 Mg Tabs (Erythromycin ethylsuccinate) .Marland Kitchen... Take 1 tablet by mouth four times a day  Orders: T-Urinalysis ([address]) T-Culture, Urine ([id number])   Problem # 4:  PERIMENOPAUSAL STATUS (ICD-627.2) Will defer to Dr. Lorel Monaco whether she wants to start a new hormonal treatment Her updated medication list for this problem includes:    Premarin 0.625 Mg/gm Crea (Estrogens, conjugated) .Marland Kitchen... Apply 0.5 mg intravaginally daily   Problem # 5:  FIBROIDS, UTERUS (ICD-218.9) Ultrasound as previously stated. Orders: Ultrasound (Ultrasound)   Complete Medication List: 1)  Hydrochlorothiazide 25 Mg Tabs (Hydrochlorothiazide) .... Take 1 tablet by mouth once a day 2)  Cozaar 100 Mg Tabs (Losartan potassium) .... Take 1 tablet by mouth once a day 3)  Aspirin 81 Mg Tbec (Aspirin) .... Take 1 tablet by mouth once a day 4)  Metformin Hcl 500 Mg Tabs (Metformin hcl) .... Take 1 tablet by mouth two times a day 5)  Nicoderm Cq 14 Mg/24hr Pt24 (Nicotine) .... Apply one patch daily 6)  Tylenol 8 Hour 650 Mg Tbcr (Acetaminophen) .... Take 1 tablet by mouth three times a day 7)  Premarin 0.625 Mg/gm Crea (Estrogens, conjugated) .... Apply 0.5 mg intravaginally daily 8)  E.e.s. 400 400 Mg Tabs (Erythromycin ethylsuccinate) .... Take 1 tablet by mouth four times a day 9)  Protonix 40 Mg Pack (Pantoprazole sodium) .... Take one tablet by mouth daily 10)  Allermax 25 Mg Tabs (Diphenhydramine hcl) .... One tablet every 12 hours as needed for nasal congestion. 11)  Hydroxyzine Hcl 25  Mg Tabs (Hydroxyzine hcl) .... Take one tab once every 6-8 hours as needed for itching.  do not take with benadryl 12)  Eucerin Lotn (Emollient) .... Apply to affected area once every four hours as needed for itching  Other Orders: Capillary Blood Glucose (29562) Fingerstick (13086) T-Urinalysis Dipstick only (57846NG) T-Comprehensive Metabolic Panel ([id number]) T-CBC w/Diff ([id number])   Patient Instructions: 1)  Please return to the clinic in February to be seen by Dr. Lorel Monaco (preferable), or Dr. Noel Gerold.  At that time we will see if the itching has improved, and if your urinary symptoms have improved. 2)  Please obtain the pelvic ultrasound that we are referring you for to evaluate your fibroids and ovaries. 3)  We will check your urine today to see if there is any infection, and will contact you with which antibiotic to take, if you need one. 4)  Please be advised that doxycycline may have caused your rash. 5)  Discuss with Dr. Lorel Monaco ptotential treatment for your hot flashes. 6)  Take the premarin cream!    Prescriptions: EUCERIN   LOTN (EMOLLIENT) apply to affected area once every four hours as needed for itching  #15 grams x 2   Entered and Authorized by:   Valetta Close MD   Signed by:   Valetta Close MD on 10/29/2007   Method used:   Print then Give to Patient   RxID:   [id number] HYDROXYZINE HCL 25 MG  TABS (HYDROXYZINE HCL) take one tab once every 6-8 hours as needed for itching.  Do not take with benadryl  #20 x 1   Entered and Authorized by:   Valetta Close MD   Signed by:   Valetta Close MD on 10/29/2007   Method used:   Print then Give to Patient   RxID:   [id number] METFORMIN HCL 500 MG TABS (METFORMIN HCL) Take 1 tablet  by mouth two times a day  #62 x 5   Entered and Authorized by:   Valetta Close MD   Signed by:   Valetta Close MD on 10/29/2007   Method used:   Print then Give to Patient   RxID:   [id number] COZAAR 100 MG TABS (LOSARTAN  POTASSIUM) Take 1 tablet by mouth once a day  #30 x 3   Entered and Authorized by:   Valetta Close MD   Signed by:   Valetta Close MD on 10/29/2007   Method used:   Print then Give to Patient   RxID:   [id number] HYDROCHLOROTHIAZIDE 25 MG TABS (HYDROCHLOROTHIAZIDE) Take 1 tablet by mouth once a day  #30 x 5   Entered and Authorized by:   Valetta Close MD   Signed by:   Valetta Close MD on 10/29/2007   Method used:   Print then Give to Patient   RxID:   [id number] METFORMIN HCL 500 MG TABS (METFORMIN HCL) Take 1 tablet by mouth two times a day  #62 x 5   Entered and Authorized by:   Valetta Close MD   Signed by:   Valetta Close MD on 10/29/2007   Method used:   Print then Give to Patient   RxID:   [id number] COZAAR 100 MG TABS (LOSARTAN POTASSIUM) Take 1 tablet by mouth once a day  #30 x 3   Entered and Authorized by:   Valetta Close MD   Signed by:   Valetta Close MD on 10/29/2007   Method used:   Print then Give to Patient   RxID:   [id number] HYDROCHLOROTHIAZIDE 25 MG TABS (HYDROCHLOROTHIAZIDE) Take 1 tablet by mouth once a day  #30 x 5   Entered and Authorized by:   Valetta Close MD   Signed by:   Valetta Close MD on 10/29/2007   Method used:   Print then Give to Patient   RxID:   [id number] EUCERIN   LOTN (EMOLLIENT) apply to affected area once every four hours as needed for itching  #15 grams x 2   Entered and Authorized by:   Valetta Close MD   Signed by:   Valetta Close MD on 10/29/2007   Method used:   Print then Give to Patient   RxID:   [id number] HYDROXYZINE HCL 25 MG  TABS (HYDROXYZINE HCL) take one tab once every 6-8 hours as needed for itching.  Do not take with benadryl  #20 x 1   Entered and Authorized by:   Valetta Close MD   Signed by:   Valetta Close MD on 10/29/2007   Method used:   Print then Give to Patient   RxID:   [id number]  ] Laboratory Results   Urine Tests  Date/Time Recieved: October 29, 2007 10:48 AM Date/Time Reported: ..................................................................Marland KitchenAlric Quan  October 29, 2007 10:48 AM   Routine Urinalysis   Color: yellow Appearance: Clear Glucose: negative   (Normal Range: Negative) Bilirubin: negative   (Normal Range: Negative) Ketone: negative   (Normal Range: Negative) Spec. Gravity: 1.020   (Normal Range: 1.003-1.035) Blood: trace-intact   (Normal Range: Negative) pH: 5.5   (Normal Range: 5.0-8.0) Protein: negative   (Normal Range: Negative) Urobilinogen: 0.2   (Normal Range: 0-1) Nitrite: negative   (Normal Range: Negative) Leukocyte Esterace: trace   (Normal Range: Negative)     Blood Tests     CBG Random: 196

## 2010-11-07 NOTE — Assessment & Plan Note (Signed)
Summary: lips and throat swollen, able to breathe and swallow/pcp-bogg...   Vital Signs:  Patient profile:   55 year old female Height:      67 inches Weight:      182.3 pounds BMI:     28.66 Temp:     97.0 degrees F oral Pulse rate:   82 / minute BP sitting:   154 / 89  (right arm)  Vitals Entered By: Filomena Jungling NT II (January 18, 2009 2:37 PM) CC: LIPS   Is Patient Diabetic? Yes  Pain Assessment Patient in pain? no      Nutritional Status BMI of 25 - 29 = overweight  Does patient need assistance? Functional Status Self care Ambulation Normal   Primary Care Provider:  Blondell Reveal MD  CC:  LIPS  .  History of Present Illness: Bridget Owens is a 55 y/o with  Diabetes mellitus, type II Hypertension Headache Tobacco use Hyperlipidemia Allergic rhinitis Hx leg cramps on Lipitor GERD Uterine fibroids Hx chest pain : exercise stress test negative 06-07 Perimenopausal Insomnia External hemorrhoids Menopause Atrophic vaginitis (04/22/2007)  Pt has been having swelling in her lips since yesterday 6pm.  First noticed by family member and has stayed the same size since then.  No trouble swallowing or breathing.  Pt denies any new facial cream or detergent.  States that she has pain from her left outer ear through the bottom of her neck/under her chin that started about 5 days ago.  Denies any rash/skin lesions in the area or any drainage form her ears. States that she does not notice having any changes in taste. Also states that he had a sharp pain in her head that started at 5:30pm last night which lasted about one minute and then went away.  Pt states that her only new meds are benzonatate since 4/8.  Pt states that her allergies have stayed about the same since last visit.  Denies any insect stings.  No focal weakness or numbness or changes in vision.  Pt states that her speech seems more slurred today and the left side of her face seems more heavy than her right.  Daughter  present with pt and confirms that pt does have slurred speech and her left side of her face is "more droopy" than the right.  Pt denies any recent fevers,  chills, CP, SOB, N/V, diarrhea.  Preventive Screening-Counseling & Management     Smoking Status: current     Smoking Cessation Counseling: yes     Packs/Day: 0.5     Year Started: at the age of 55     Passive Smoke Exposure: yes     Does Patient Exercise: no     Type of exercise: walkiing     Times/week: 2  Current Medications (verified): 1)  Hyzaar 100-25 Mg Tabs (Losartan Potassium-Hctz) .... Take 1 Tablet By Mouth Once A Day 2)  Aspirin 81 Mg Tbec (Aspirin) .... Take 1 Tablet By Mouth Once A Day 3)  Janumet 50-500 Mg Tabs (Sitagliptin-Metformin Hcl) .... Take 1 Tablet By Mouth Two Times A Day 4)  Tylenol 8 Hour 650 Mg  Tbcr (Acetaminophen) .... Take 1 Tablet By Mouth Three Times A Day 5)  Caduet 5-20 Mg Tabs (Amlodipine-Atorvastatin) .... Take 1 Tablet By Mouth Once A Day 6)  Freestyle Lite Test   Strp (Glucose Blood) .... Use To Test Blood Glucsoe Once Time A Day 7)  Lancets   Misc (Lancets) .... Use To Test Blood Glucose One  Time A Day 8)  Freestyle Control Solution   Liqd (Blood Glucose Calibration) .... Use To Calibrate Meter and Assure Quality Readings. 9)  Catapres 0.1 Mg Tabs (Clonidine Hcl) .... Take 1 Tab By Mouth At Bedtime 10)  Fluticasone Propionate 50 Mcg/act Susp (Fluticasone Propionate) .... Take 2 Sprays in Each Nostril Once Daily For 3 Days, Then Once in Each Nostril Daily For 2 Weeks 11)  Zyrtec-D Allergy & Congestion 5-120 Mg Xr12h-Tab (Cetirizine-Pseudoephedrine) .... Take 1 Tablet By Mouth Two Times A Day 12)  Prodigy No Coding Blood Gluc  Strp (Glucose Blood) .... Use To Check Your Sugar 1-2 Times A Day 13)  Prodigy Blood Glucose Monitor  Devi (Blood Glucose Monitoring Suppl) .... Use To Check Your Blood Sugar 14)  Benzonatate 200 Mg Caps (Benzonatate) .... Take One Capsule By Mouth Once Every 6 Hours As Needed  For Cough  Allergies (verified): 1)  ! Codeine 2)  ! Vicodin 3)  ! Doxycycline Hyclate (Doxycycline Hyclate)  Past History:  Past Medical History:    Diabetes mellitus, type II    Hypertension    Headache    Tobacco use    Hyperlipidemia    Allergic rhinitis    Hx leg cramps on Lipitor    GERD    Uterine fibroids    Hx chest pain : exercise stress test negative 06-07    Perimenopausal    Insomnia    External hemorrhoids    Menopause    Atrophic vaginitis (04/22/2007)  Family History:    Reviewed history from 08/31/2008 and no changes required:       M: died with bad cough 1969 @ 57yo, not sure about health history       S: died with bad cough 1996 @ 55yo, of pneumonia  Social History:    Reviewed history from 11/12/2006 and no changes required:       Current Smoker  Review of Systems      See HPI  Physical Exam  General:  NAD, A&Ox3, ?slurred speech, left facial droop Head:  No masses noted on side of the face Eyes:  EOMI, PERRL Ears:  No vessicles or rash in left ear canal. TM's WNL bilaterally.   Mouth:  OP/OC clear, MMM. Lips do not appear markedly swollen.  Neck:  TTP in the left side of the neck w/o masses or bruits. Lungs:  CTAB Heart:  RRR, No M/R/G Extremities:  No LE edema Neurologic:  A&Ox3, Pt has weakness to eye closing on left but still able to close lids all the way. Asymmetry upon smiling on left side of mouth. No tounge deviation. Unable to raise left eyebrow or make wrinkles on left side of her forehead.   Sensation intact to light touch intact on entire face bilaterally.  Unable to puff out cheeks.  5/5 strength and normal sensation to light though in all extremities.  FTN normal. Gait normal. Negative Romberg.     Impression & Recommendations:  Problem # 1:  BELL'S PALSY, LEFT (ICD-351.0) Pt's symtoms and physical exam most consistant with Bell's palsy on left rather than CVA/TIA.  Pt pt, her brother also had Bell's palsy in the past as well.   Per UpToDate will prescribe her one week of prednisone and Valtrex 1000mg  three times a day for one week for severe facial palsy [facility] Scale).  Pt has an ophthalmologist and we have asked her to contact him to let him know that she has Bell's Palsy and will need  to make sure that her left eye does not dry out.  She already has eye drops for lubrication so will contiue with her artificial tears as needed for now.  Pt will need to follow up in one month to assure that her symptoms have resolved.    Problem # 2:  SMOKER (ICD-305.1) Pt continues to smoke and is wanting to try the Nicotrol inhaler so will start today and reassess at next visit.    Her updated medication list for this problem includes:    Nicotrol 10 Mg Inha (Nicotine) ..... Use as often as needed whenever you have urge to smoke cig. use a minimum of 6 cartiges per day and a maximum of 16.  Problem # 3:  HYPERTENSION (ICD-401.9) Pt states that she did not take any of her medications today and although her BP is above goal, I will not increase her dosage of anti-HTN meds  b/c of this and the fact that on her last 2 visits, her BP were well controlled.  Wil recheck at next visit.    Her updated medication list for this problem includes:    Hyzaar 100-25 Mg Tabs (Losartan potassium-hctz) .Marland Kitchen... Take 1 tablet by mouth once a day    Caduet 5-20 Mg Tabs (Amlodipine-atorvastatin) .Marland Kitchen... Take 1 tablet by mouth once a day    Catapres 0.1 Mg Tabs (Clonidine hcl) .Marland Kitchen... Take 1 tab by mouth at bedtime  Complete Medication List: 1)  Hyzaar 100-25 Mg Tabs (Losartan potassium-hctz) .... Take 1 tablet by mouth once a day 2)  Aspirin 81 Mg Tbec (Aspirin) .... Take 1 tablet by mouth once a day 3)  Janumet 50-500 Mg Tabs (Sitagliptin-metformin hcl) .... Take 1 tablet by mouth two times a day 4)  Tylenol 8 Hour 650 Mg Tbcr (Acetaminophen) .... Take 1 tablet by mouth three times a day 5)  Caduet 5-20 Mg Tabs (Amlodipine-atorvastatin) .... Take 1  tablet by mouth once a day 6)  Freestyle Lite Test Strp (Glucose blood) .... Use to test blood glucsoe once time a day 7)  Lancets Misc (Lancets) .... Use to test blood glucose one time a day 8)  Freestyle Control Solution Liqd (Blood glucose calibration) .... Use to calibrate meter and assure quality readings. 9)  Catapres 0.1 Mg Tabs (Clonidine hcl) .... Take 1 tab by mouth at bedtime 10)  Fluticasone Propionate 50 Mcg/act Susp (Fluticasone propionate) .... Take 2 sprays in each nostril once daily for 3 days, then once in each nostril daily for 2 weeks 11)  Zyrtec-d Allergy & Congestion 5-120 Mg Xr12h-tab (Cetirizine-pseudoephedrine) .... Take 1 tablet by mouth two times a day 12)  Prodigy No Coding Blood Gluc Strp (Glucose blood) .... Use to check your sugar 1-2 times a day 13)  Prodigy Blood Glucose Monitor Devi (Blood glucose monitoring suppl) .... Use to check your blood sugar 14)  Benzonatate 200 Mg Caps (Benzonatate) .... Take one capsule by mouth once every 6 hours as needed for cough 15)  Nicotrol 10 Mg Inha (Nicotine) .... Use as often as needed whenever you have urge to smoke cig. use a minimum of 6 cartiges per day and a maximum of 16. 16)  Prednisone 50 Mg Tabs (Prednisone) .... Take one tab by mouth daily for 7 days. 17)  Valtrex 500 Mg Tabs (Valacyclovir hcl) .... Take 2 tabs by mouth three times a day for 7 days.  Patient Instructions: 1)  Please schedule a follow-up appointment in 1 month. 2)  You can try  over the counter Alleive to help with the neck pain but this should resolve by itself.   3)  You current symptoms should show progressive improvement over the next couple of months.   4)  Please call your eye doctor and let him know that you have Bell's Palsy and will need to be evaluated by him/her.   Prescriptions: VALTREX 500 MG TABS (VALACYCLOVIR HCL) Take 2 tabs by mouth three times a day for 7 days.  #35 x 0   Entered and Authorized by:   Lucy Antigua MD   Signed by:    Lucy Antigua MD on 01/18/2009   Method used:   Electronically to        [facility] [id number] (retail)       [address] [place], [place]  [address]       Ph: [phone]       Fax: [phone]   RxID:   [id number] PREDNISONE 50 MG TABS (PREDNISONE) Take one tab by mouth daily for 7 days.  #7 x 0   Entered and Authorized by:   Lucy Antigua MD   Signed by:   Lucy Antigua MD on 01/18/2009   Method used:   Electronically to        [facility] [id number] (retail)       [address] [place], [place]  [address]       Ph: [phone]       Fax: [phone]   RxID:   [id number] NICOTROL 10 MG INHA (NICOTINE) Use as often as needed whenever you have urge to smoke cig. Use a minimum of 6 cartiges per day and a maximum of 16.  #1 kit x 3   Entered and Authorized by:   Lucy Antigua MD   Signed by:   Lucy Antigua MD on 01/18/2009   Method used:   Electronically to        [facility] [id number] (retail)       [address] [place], [place]  [address]       Ph: [phone]       Fax: [phone]   RxID:   [id number]

## 2010-11-07 NOTE — Progress Notes (Signed)
Summary: Prior Approval/ Janumet/ Approval  Phone Note Outgoing Call   Call placed by: Angelina Ok RN,  June 13, 2010 11:07 AM Call placed to: Insurer Summary of Call: Call to [facility] for Prior Authorization on Janumet 50.  Authorization approved for 1 year starting 06/13/2010 thru 06/13/2011. Angelina Ok RN  June 13, 2010 11:08 AM  Approval called to the [facility] on [address]. Angelina Ok RN  June 13, 2010 11:09 AM  Initial call taken by: Angelina Ok RN,  June 13, 2010 11:09 AM

## 2010-11-07 NOTE — Progress Notes (Signed)
Summary: Refill/gh  Phone Note Refill Request Message from:  Pharmacy on May 28, 2008 11:23 AM  Refills Requested: Medication #1:  HYDROCHLOROTHIAZIDE 25 MG TABS Take 1 tablet by mouth once a day   Last Refilled: 04/22/2008  Method Requested: Electronic Initial call taken by: Angelina Ok RN,  May 28, 2008 11:23 AM  Follow-up for Phone Call        RX sent electronically.  Pt should have a BMET at her next visit to assess her Cr and K. Follow-up by: Chauncey Reading DO,  May 28, 2008 12:19 PM      Prescriptions: HYDROCHLOROTHIAZIDE 25 MG TABS (HYDROCHLOROTHIAZIDE) Take 1 tablet by mouth once a day  #30 x 1   Entered and Authorized by:   Chauncey Reading DO   Signed by:   Chauncey Reading DO on 05/28/2008   Method used:   Electronically to        [facility] [id number] (retail)       [address] [place], [place]  [address]       Ph: [phone]       Fax: [phone]   RxID:   [id number]

## 2010-11-07 NOTE — Assessment & Plan Note (Signed)
Summary: ACUTE-SPOT ON LEFT LEG AND NUMBNESS/(DUGUAY)/CFB   Vital Signs:  Patient Profile:   55 Years Old Female Height:     67 inches (170.18 cm) Weight:      190.5 pounds (86.59 kg) BMI:     29.94 Temp:     98.1 degrees F (36.72 degrees C) oral Pulse rate:   82 / minute BP sitting:   163 / 93  (right arm)  Pt. in pain?   yes    Location:   right leg    Intensity:   8  Vitals Entered By: Stanton Kidney Ditzler RN (Feb 12, 2007 10:43 AM)              Is Patient Diabetic? Yes  Nutritional Status BMI of 25 - 29 = overweight Nutritional Status Detail good CBG Result 266  Have you ever been in a relationship where you felt threatened, hurt or afraid?denies   Does patient need assistance? Functional Status Self care Ambulation Normal   Chief Complaint:  Sinus problems and eye watering, pain in right leg for past year-getting worse, and inc in wt and lack of sleep for past year.Marland Kitchen  History of Present Illness: 55 y/o AA femalewith PMH of DM, HTN, HLP that comes in today for R leg pain. that start 1 month ago that is steady. This is the first time shes had an apisode like this.  It has not  gotten worst over this month.ir's sharp in nature has no radiation and has no associated symptoms. She has some numbness on her L leg which has been going on for 1year. The areaIt is very tender, even when she is bathing it is hurting.  She has no erythema, no trauma, no skin breakdown, no fever,no chills, no pain with exercise, it is not painful with resting.  Current Allergies (reviewed today): ! PCN ! CODEINE ! VICODIN    Risk Factors: Tobacco use:  current    Cigarettes:  Yes -- 1/2 pack(s) per day  Mammogram History:    Date of Last Mammogram:  01/02/2006  PAP Smear History:    Date of Last PAP Smear:  01/07/2004   Review of Systems  The patient denies anorexia, fever, weight loss, decreased hearing, and peripheral edema.     Physical Exam  General:  Well-developed,well-nourished,in no acute distress; alert,appropriate and cooperative throughout examination Msk:     No deformity or scoliosis noted of thoracic or lumbar spine.   Extremities:     No clubbing, cyanosis, edema,  with normal full range of motion of all joints.  the is a slight elevation of the area.Tenderness to  palpation on the right leg. It's a 1 inche area on the lateral aspect of the leg, above the malleolus. It has an old scar above it. Neurologic:     No cranial nerve deficits noted. Station and gait are normal. Plantar reflexes are down-going bilaterally. DTRs are symmetrical throughout. Sensory, motor and coordinative functions appear intact. Skin:     no erythema no rashes, no pustules noted    Impression & Recommendations:  Problem # 1:  LEG PAIN, RIGHT (ICD-729.5) Ms Bridget Owens comes For R leg pain in the laterl aspect above the malleolus, it's a1i inche area that is very tender. I will get a CK & ESR to evaluate for any kind of Myositis or loca linfectious/inflamatory process she might have.  Although this is very unlikely, with this presentation.  I do not think this DM neuropathy b /o of  it's asymetry and how localize this pain is. I have consider the possibility of a local nerve impinment, but this is very unlikele wit out any weakness. I have schedule to see her in 1 month were we will reevaluate this leg pain.  She has been advise to come to the [facility] if this persist or if it worsen. Orders: T-CK Total [id number]) T-Sed Rate (Automated) [phone])   Problem # 2:  HYPERTENSION (ICD-401.9) BP high for the last consecutive visit, Pt relates not talking her medication this AM.  I will not start a BP med. at this time I will shedule a visit in 1 month with her PCP to recheck her BP and start on a BP med if it continues to be high. Her updated medication list for this problem includes:    Hydrochlorothiazide 25 Mg Tabs (Hydrochlorothiazide) .Marland Kitchen... Take 1 tablet by  mouth once a day    Cozaar 100 Mg Tabs (Losartan potassium) .Marland Kitchen... Take 1 tablet by mouth once a day   Problem # 3:  DIABETES MELLITUS, TYPE II (ICD-250.00) HgbA1c slightly high than before. Pt is walking about 2 mile 4 times a week. Will not change any of her medication at this time will schedule an apoinment with Lupita Leash for Dm counseling. And  to help her also lose wieght which she is complaining. Her updated medication list for this problem includes:    Cozaar 100 Mg Tabs (Losartan potassium) .Marland Kitchen... Take 1 tablet by mouth once a day    Aspirin 81 Mg Tbec (Aspirin) .Marland Kitchen... Take 1 tablet by mouth once a day    Metformin Hcl 500 Mg Tabs (Metformin hcl) .Marland Kitchen... Take 1 tablet by mouth two times a day  Orders: T- Capillary Blood Glucose (62952) T-Hgb A1C (in-house) (84132GM)    Patient Instructions: 1)  Apointment with Lupita Leash for DM and Weight loss. 2)  Please schedule an appointment with your primary doctor in : 1 month 3)  It is important that you exercise regularly at least 20 minutes 5 times a week. If you develop chest pain, have severe difficulty breathing, or feel very tired , stop exercising immediately and seek medical attention.     Vital Signs:  Patient Profile:   55 Years Old Female Height:     67 inches (170.18 cm) Weight:      190.5 pounds (86.59 kg) BMI:     29.94 Temp:     98.1 degrees F (36.72 degrees C) oral Pulse rate:   82 / minute BP sitting:   163 / 93    Location:   right leg    Intensity:   8             CBG Result 266 Last PAP Result Normal PapHx  Normal (01/07/2004 6:57:29 PM)     Laboratory Results   Blood Tests   Date/Time Recieved: Feb 12, 2007 11:25 AM  Date/Time Reported: Feb 12, 2007 11:25 AM ..................................................................Marland KitchenMeta Moore  Feb 12, 2007 11:25 AM   HGBA1C: 7.1%   (Normal Range: Non-Diabetic - 3-6%   Control Diabetic - 6-8%) CBG Random: 266  CBC

## 2010-11-07 NOTE — Consult Note (Signed)
Summary: [facility]  [facility]   Imported By: Florinda Marker 11/25/2009 15:59:26  _____________________________________________________________________  External Attachment:    Type:   Image     Comment:   External Document

## 2010-11-07 NOTE — Progress Notes (Signed)
----   Converted from flag ---- ---- 03/01/2009 9:45 AM, Chinita Pester RN wrote: Ms. Lorenz was called and made awared of bacterial vaginitis dx and medication at [facility]. Instructed pt to avoid alcohol per Dr. Reynold Bowen. Pt. states she does not drink alcohol.  ---- 03/01/2009 8:58 AM, Olene Craven MD wrote: Rivka Barbara, can you please let Mrs. Malizia know that she has a bacterial vaginitis and that I sent a script for metronidazole 500 mg by mouth two times a day x 7 days to [facility]. She must avoid any alcohol while she is on tx. Thanks ! Mariane ------------------------------

## 2010-11-07 NOTE — Assessment & Plan Note (Signed)
Summary: ACUTE-MRI RESULTS/CFB   Vital Signs:  Patient profile:   55 year old female Height:      65 inches (165.10 cm) Weight:      182.7 pounds (83.05 kg) BMI:     30.51 Temp:     98.1 degrees F (36.72 degrees C) oral Resp:     82 per minute BP sitting:   122 / 82  (right arm)  Vitals Entered By: Theotis Barrio NT II (July 19, 2009 4:05 PM) CC: RIGHT SIDE "BURNING FEELING"   / PATIENT IS HERE FOR RESULTS  U/S  / PATIENT REFUSE FLU SHOT Is Patient Diabetic? Yes  Pain Assessment Patient in pain? yes     Location: R/SIDE Intensity:    0 Type: burning Nutritional Status BMI of > 30 = obese  Have you ever been in a relationship where you felt threatened, hurt or afraid?No   Does patient need assistance? Functional Status Self care Ambulation Normal Comments RIGHT SIDE " BURNING"  / REFUSE FLU SHOT   Primary Care Provider:  Blondell Reveal MD  CC:  RIGHT SIDE "BURNING FEELING"   / PATIENT IS HERE FOR RESULTS  U/S  / PATIENT REFUSE FLU SHOT.  History of Present Illness: 55 years old female with PMH of DM, HTN, HLD comes to the office for a routene visit. i saw her in the office last month for RLQ  ?abdominal pain. The etiology was uncertain and the pelvic ultrasound showed leimyomatous uterus. she reports that she is still having rlq burning sensation and denies any pain. her complaints are associated with hot flashes and she denies any n,v,d, constipation, irregular menstrual bleeding. she denies any other complaints.  she reports that her insurance is not paying for lipitor and is asking for change of her medicine.   Preventive Screening-Counseling & Management  Alcohol-Tobacco     Alcohol drinks/day: 0     Smoking Status: current     Smoking Cessation Counseling: yes     Packs/Day: 0.25     Year Started: at the age of 28     Passive Smoke Exposure: yes  Caffeine-Diet-Exercise     Does Patient Exercise: no     Type of exercise: walkiing     Times/week: 2   Problems Prior to Update: 1)  Preventive Health Care  (ICD-V70.0) 2)  Rlq Pain  (ICD-789.03) 3)  Cigarette Smoker  (ICD-305.1) 4)  Ear Pain, Right  (ICD-388.70) 5)  Insomnia  (ICD-780.52) 6)  Unspecified Sinusitis  (ICD-473.9) 7)  Unspecified Vaginitis and Vulvovaginitis  (ICD-616.10) 8)  Bacterial Vaginitis  (ICD-616.10) 9)  Bell's Palsy, Left  (ICD-351.0) 10)  Allergic Rhinitis  (ICD-477.9) 11)  Adenomatous Colonic Polyp  (ICD-211.3) 12)  Diabetes Mellitus, Type II  (ICD-250.00) 13)  Hypertension  (ICD-401.9) 14)  Hypertensive Retinopathy  (ICD-362.11) 15)  Dyslipidemia  (ICD-272.4) 16)  Pruritus  (ICD-698.9) 17)  Diabetic Peripheral Neuropathy  (ICD-250.60) 18)  Health Maintenance Exam  (ICD-V70.0) 19)  Smoker  (ICD-305.1) 20)  Gerd  (ICD-530.81) 21)  Hx of Leg Cramps  (ICD-729.82) 22)  Fibroids, Uterus  (ICD-218.9) 23)  Perimenopausal Status  (ICD-627.2) 24)  External Hemorrhoids  (ICD-455.3)  Medications Prior to Update: 1)  Hyzaar 100-25 Mg Tabs (Losartan Potassium-Hctz) .... Take 1 Tablet By Mouth Once A Day 2)  Aspirin 81 Mg Tbec (Aspirin) .... Take 1 Tablet By Mouth Once A Day 3)  Janumet 50-500 Mg Tabs (Sitagliptin-Metformin Hcl) .... Take 1 Tablet By Mouth Two Times A Day 4)  Tylenol 8 Hour 650 Mg  Tbcr (Acetaminophen) .... Take 1 Tablet By Mouth Three Times A Day 5)  Freestyle Lite Test   Strp (Glucose Blood) .... Use To Test Blood Glucsoe Once Time A Day 6)  Lancets   Misc (Lancets) .... Use To Test Blood Glucose One Time A Day 7)  Freestyle Control Solution   Liqd (Blood Glucose Calibration) .... Use To Calibrate Meter and Assure Quality Readings. 8)  Catapres 0.1 Mg Tabs (Clonidine Hcl) .... Take 2 Tablets By Mouth At Bedtime 9)  Prodigy No Coding Blood Gluc  Strp (Glucose Blood) .... Use To Check Your Sugar 1-2 Times A Day 10)  Prodigy Blood Glucose Monitor  Devi (Blood Glucose Monitoring Suppl) .... Use To Check Your Blood Sugar 11)  Premarin 0.625 Mg/gm Crea  (Estrogens, Conjugated) .... Apply Once Daily For 2 Weeks. Twice Weekly Thereafter 12)  Claritin 10 Mg Tabs (Loratadine) .... Take 1 Tablet By Mouth Once A Day 13)  Cortisporin 3.5-10000-1 Soln (Neomycin-Polymyxin-Hc) .... 4 Drops Three Times A Day Into Affected Ear. 14)  Flunisolide 0.025 % Soln (Flunisolide) .... 2 Sprays in Each Nostril Twice Daily 15)  Nicotine 21 Mg/24hr Pt24 (Nicotine) .... Apply One Patch One Daily As Directed For 6 Weeks. 16)  Nicotine 14 Mg/24hr Pt24 (Nicotine) .... Apply One Patch One Daily As Directed For 4 Weeks. 17)  Nicotine 7 Mg/24hr Pt24 (Nicotine) .... Apply One Patch One Daily As Directed For 2 Weeks. 18)  Lipitor 20 Mg Tabs (Atorvastatin Calcium) .... Take 1 Tablet By Mouth Once A Day At Bed Time  Allergies (verified): 1)  ! Codeine 2)  ! Vicodin 3)  ! Doxycycline Hyclate (Doxycycline Hyclate)  Past History:  Past Medical History: Last updated: 02/28/2009 L sided Bell's palsy 01/2009 Diabetes mellitus, type II Hypertension Headache Tobacco use Hyperlipidemia Allergic rhinitis Hx leg cramps on Lipitor GERD Uterine fibroids Hx chest pain : exercise stress test negative 06-07 Perimenopausal ? Insomnia External hemorrhoids  Family History: Last updated: 25-Sep-2008 M: died with bad cough 1969 @ 57yo, not sure about health history S: died with bad cough 1996 @ 57yo, of pneumonia  Risk Factors: Alcohol Use: 0 (07/19/2009) Exercise: no (07/19/2009)  Family History: Reviewed history from 2008/09/25 and no changes required. M: died with bad cough 1969 @ 57yo, not sure about health history S: died with bad cough 1996 @ 55yo, of pneumonia  Social History: Reviewed history from 11/12/2006 and no changes required. Current Smoker Packs/Day:  0.25  Review of Systems      See HPI  Physical Exam  General:  Well-developed,well-nourished,in no acute distress; alert,appropriate and cooperative throughout examination Head:  Normocephalic and  atraumatic without obvious abnormalities. No apparent alopecia or balding. Eyes:  vision grossly intact, pupils equal, pupils round, pupils reactive to light, no injection and anicteric.    Mouth:  Oral mucosa and oropharynx without lesions or exudates.  poor dentition. Lungs:  Normal respiratory effort, chest expands symmetrically. Lungs are clear to auscultation, no crackles or wheezes. Heart:  Normal rate and regular rhythm. S1 and S2 normal without gallop, murmur, click, rub or other extra sounds. Abdomen:  Bowel sounds positive,abdomen soft and non-tender without masses, organomegaly or hernias noted. There is only very mild tenderness to deep palpation over the RLQ area. No guarding, rigidity noted Msk:  No deformity or scoliosis noted of thoracic or lumbar spine.   Pulses:  R radial normal.   Extremities:  No clubbing, cyanosis, edema, or deformity noted with normal full  range of motion of all joints.   Neurologic:  alert & oriented X3, strength normal in all extremities, and gait normal.     Impression & Recommendations:  Problem # 1:  RLQ PAIN (ICD-789.03)  Unclear etiology. Her RLQ complaints are burning sensation and not really pain. suspect her complaints are related to menopausal syndrome. increased her clonidine to 0.2 mg. will check u/a and will refer her to gynecology. also suspect leiomyoma of uterus causing these symptoms.  Orders: Gynecologic Referral (Gyn) T-Urinalysis ([id number])  Orders: Gynecologic Referral (Gyn) T-Urinalysis ([id number])  Discussed symptom control with the patient.    Problem # 2:  DYSLIPIDEMIA (ICD-272.4)  will change her lipitor to pravachol given problems with her insurance.   Labs Reviewed: SGOT: 14 (01/05/2009)   SGPT: 16 (01/05/2009)   HDL:55 (06/20/2009), 44 (01/05/2009)  LDL:140 (06/20/2009), 94 (84/69/6295)  Chol:219 (06/20/2009), 155 (01/05/2009)  Trig:118 (06/20/2009), 83 (01/05/2009)   Problem # 3:  DIABETES MELLITUS, TYPE  II (ICD-250.00) well controlled. no changes made. Her updated medication list for this problem includes:    Hyzaar 100-25 Mg Tabs (Losartan potassium-hctz) .Marland Kitchen... Take 1 tablet by mouth once a day    Aspirin 81 Mg Tbec (Aspirin) .Marland Kitchen... Take 1 tablet by mouth once a day    Janumet 50-500 Mg Tabs (Sitagliptin-metformin hcl) .Marland Kitchen... Take 1 tablet by mouth two times a day   Labs Reviewed: Creat: 0.74 (01/05/2009)     Last Eye Exam: No diabetic retinopathy.    (02/08/2009) Reviewed HgBA1c results: 6.5 (06/20/2009)  6.6 (03/21/2009)   Problem # 4:  HYPERTENSION (ICD-401.9)  well controlled. no changes made.  BP today: 122/82 Prior BP: 156/92 (06/20/2009)  Labs Reviewed: K+: 4.1 (01/05/2009) Creat: : 0.74 (01/05/2009)   Chol: 219 (06/20/2009)   HDL: 55 (06/20/2009)   LDL: 140 (06/20/2009)   TG: 118 (06/20/2009)   Her updated medication list for this problem includes:    Hyzaar 100-25 Mg Tabs (Losartan potassium-hctz) .Marland Kitchen... Take 1 tablet by mouth once a day    Catapres 0.1 Mg Tabs (Clonidine hcl) .Marland Kitchen... Take 2 tablets by mouth at bedtime    Complete Medication List: 1)  Hyzaar 100-25 Mg Tabs (Losartan potassium-hctz) .... Take 1 tablet by mouth once a day 2)  Aspirin 81 Mg Tbec (Aspirin) .... Take 1 tablet by mouth once a day 3)  Janumet 50-500 Mg Tabs (Sitagliptin-metformin hcl) .... Take 1 tablet by mouth two times a day 4)  Tylenol 8 Hour 650 Mg Tbcr (Acetaminophen) .... Take 1 tablet by mouth three times a day 5)  Freestyle Lite Test Strp (Glucose blood) .... Use to test blood glucsoe once time a day 6)  Lancets Misc (Lancets) .... Use to test blood glucose one time a day 7)  Freestyle Control Solution Liqd (Blood glucose calibration) .... Use to calibrate meter and assure quality readings. 8)  Catapres 0.1 Mg Tabs (Clonidine hcl) .... Take 2 tablets by mouth at bedtime 9)  Prodigy No Coding Blood Gluc Strp (Glucose blood) .... Use to check your sugar 1-2 times a day 10)  Prodigy  Blood Glucose Monitor Devi (Blood glucose monitoring suppl) .... Use to check your blood sugar 11)  Premarin 0.625 Mg/gm Crea (Estrogens, conjugated) .... Apply once daily for 2 weeks. twice weekly thereafter 12)  Claritin 10 Mg Tabs (Loratadine) .... Take 1 tablet by mouth once a day 13)  Cortisporin 3.5-10000-1 Soln (Neomycin-polymyxin-hc) .... 4 drops three times a day into affected ear. 14)  Flunisolide 0.025 % Soln (Flunisolide) .Marland KitchenMarland KitchenMarland Kitchen  2 sprays in each nostril twice daily 15)  Nicotine 21 Mg/24hr Pt24 (Nicotine) .... Apply one patch one daily as directed for 6 weeks. 16)  Nicotine 14 Mg/24hr Pt24 (Nicotine) .... Apply one patch one daily as directed for 4 weeks. 17)  Nicotine 7 Mg/24hr Pt24 (Nicotine) .... Apply one patch one daily as directed for 2 weeks. 18)  Pravachol 40 Mg Tabs (Pravastatin sodium) .... Take 1 tablet by mouth once a day at bedtime Gynecologic Referral (Gyn) T-Urinalysis ([id number])  Patient Instructions: 1)  Please schedule a follow-up appointment in 2 months. 2)  Follow up with Gynecologist as advised. 3)  Take Pravachol 40 mg once daily at bedtime. 4)  Take all the other medications as advsed below. 5)  It is important that you exercise regularly at least 20 minutes 5 times a week. If you develop chest pain, have severe difficulty breathing, or feel very tired , stop exercising immediately and seek medical attention. 6)  You need to lose weight. Consider a lower calorie diet and regular exercise.  Prescriptions: PRAVACHOL 40 MG TABS (PRAVASTATIN SODIUM) Take 1 tablet by mouth once a day at bedtime  #3 x 2   Entered and Authorized by:   Blondell Reveal MD   Signed by:   Blondell Reveal MD on 07/19/2009   Method used:   Print then Give to Patient   RxID:   [id number]  Process Orders Check Orders Results:     [facility]: ABN not required for this insurance Tests Sent for requisitioning (July 19, 2009 9:06 PM):     07/19/2009: [facility] -- T-Urinalysis [[id number]] (signed)    Process Orders Check Orders Results:     [facility]: ABN not required for this insurance Tests Sent for requisitioning (July 19, 2009 9:06 PM):     07/19/2009: [facility] -- T-Urinalysis [[id number]] (signed)     Impression & Recommendations:  Orders: Gynecologic Referral (Gyn) T-Urinalysis ([id number])   The following medications were removed from the medication list:    Lipitor 20 Mg Tabs (Atorvastatin calcium) .Marland Kitchen... Take 1 tablet by mouth once a day at bed time  Her updated medication list for this problem includes:    Pravachol 40 Mg Tabs (Pravastatin sodium) .Marland Kitchen... Take 1 tablet by mouth once a day at bedtime   Her updated medication list for this problem includes:    Hyzaar 100-25 Mg Tabs (Losartan potassium-hctz) .Marland Kitchen... Take 1 tablet by mouth once a day    Aspirin 81 Mg Tbec (Aspirin) .Marland Kitchen... Take 1 tablet by mouth once a day    Janumet 50-500 Mg Tabs (Sitagliptin-metformin hcl) .Marland Kitchen... Take 1 tablet by mouth two times a day   Her updated medication list for this problem includes:    Hyzaar 100-25 Mg Tabs (Losartan potassium-hctz) .Marland Kitchen... Take 1 tablet by mouth once a day    Catapres 0.1 Mg Tabs (Clonidine hcl) .Marland Kitchen... Take 2 tablets by mouth at bedtime   Complete Medication List: 1)  Hyzaar 100-25 Mg Tabs (Losartan potassium-hctz) .... Take 1 tablet by mouth once a day 2)  Aspirin 81 Mg Tbec (Aspirin) .... Take 1 tablet by mouth once a day 3)  Janumet 50-500 Mg Tabs (Sitagliptin-metformin hcl) .... Take 1 tablet by mouth two times a day 4)  Tylenol 8 Hour 650 Mg Tbcr (Acetaminophen) .... Take 1 tablet by mouth three times a day 5)  Freestyle Lite Test Strp (Glucose blood) .... Use to test blood glucsoe once time a day  6)  Lancets Misc (Lancets) .... Use to test blood glucose one time a day 7)  Freestyle Control Solution Liqd (Blood glucose calibration) .... Use to calibrate  meter and assure quality readings. 8)  Catapres 0.1 Mg Tabs (Clonidine hcl) .... Take 2 tablets by mouth at bedtime 9)  Prodigy No Coding Blood Gluc Strp (Glucose blood) .... Use to check your sugar 1-2 times a day 10)  Prodigy Blood Glucose Monitor Devi (Blood glucose monitoring suppl) .... Use to check your blood sugar 11)  Premarin 0.625 Mg/gm Crea (Estrogens, conjugated) .... Apply once daily for 2 weeks. twice weekly thereafter 12)  Claritin 10 Mg Tabs (Loratadine) .... Take 1 tablet by mouth once a day 13)  Cortisporin 3.5-10000-1 Soln (Neomycin-polymyxin-hc) .... 4 drops three times a day into affected ear. 14)  Flunisolide 0.025 % Soln (Flunisolide) .... 2 sprays in each nostril twice daily 15)  Nicotine 21 Mg/24hr Pt24 (Nicotine) .... Apply one patch one daily as directed for 6 weeks. 16)  Nicotine 14 Mg/24hr Pt24 (Nicotine) .... Apply one patch one daily as directed for 4 weeks. 17)  Nicotine 7 Mg/24hr Pt24 (Nicotine) .... Apply one patch one daily as directed for 2 weeks. 18)  Pravachol 40 Mg Tabs (Pravastatin sodium) .... Take 1 tablet by mouth once a day at bedtime

## 2010-11-07 NOTE — Assessment & Plan Note (Signed)
Summary: coughing [mkj]   Vital Signs:  Patient Profile:   55 Years Old Female Height:     67 inches (170.18 cm) Weight:      184.4 pounds (83.82 kg) BMI:     28.99 Temp:     97.1 degrees F (36.17 degrees C) oral Pulse rate:   86 / minute BP sitting:   126 / 81  (left arm)  Pt. in pain?   no  Vitals Entered By: Chinita Pester RN (August 31, 2008 10:42 AM)              Is Patient Diabetic? Yes Did you bring your meter with you today? No Nutritional Status BMI of 25 - 29 = overweight CBG Result 155  Have you ever been in a relationship where you felt threatened, hurt or afraid?No   Does patient need assistance? Functional Status Self care Ambulation Normal     PCP:  Blondell Reveal MD  Chief Complaint:  Coughing since August esp. at night-prod.at times; no fever.  History of Present Illness: Bridget Owens is a 55 yo lady with PMH as outlined in the EMR. She was last seen in the [facility] by me on 08/13/2008 for a productive cough.  She returns with ongoing cough, productive of thick whitish material, no hemoptysis. No relief with symptomatic medications given last visit.  Associated dyspnea. No N/V/D, some constipation. Some post-tussive gagging, as well as flatulence and urinary incontinence. All of this is limiting her ability to get out and help her elderly neighbors. C/o pain in back and sides from coughing. C/o sore throat, and loses voice sometimes, most recently yesterday.  Denies fevers. Reports hot flashes since 55 yo which come on suddenly, assocaited with sweating. She endorses sheet-soaking night sweats. She thinks she might have lost weight recently. Weight history does not really support significant weight loss (max 193, today 182 today): 182.1 (08/13/2008 3:39:35 PM) 185.3 (08/02/2008 2:04:48 PM) 186.7 (06/28/2008 9:19:17 AM) 186.7 (04/23/2008 10:11:23 AM) 187.3 (04/07/2008 9:26:45 AM) 186.5 (03/23/2008 3:01:15 PM) 187.3 (03/03/2008 9:06:22 AM) 180.1 (02/24/2008  11:00:13 AM) 189.3 (01/01/2008 10:32:34 AM) 188.05 (12/02/2007 9:07:27 AM) 188.1 (10/29/2007 9:33:36 AM) 191.03 (08/15/2007 10:06:52 AM) 192.4 (07/02/2007 10:08:14 AM) 193.3 (06/17/2007 9:40:09 AM) 1588.6 (05/23/2007 10:24:17 AM) 192.6 (04/22/2007 9:33:28 AM) 192.2 (03/18/2007 9:14:49 AM) 190.5 (02/12/2007 10:23:27 AM) 189.3 (12/17/2006 9:22:42 AM) 188.0 (11/12/2006 11:15:20 AM)    smokes 1 cigarettes two times a day. Trying to quit.  Grandmother had TB. Died in 03-19-06 of MI. Reports PPD negative in 03/19/02 or 4.  Nephew released from prison in June 2008, and she spent time with him.     Prior Medications Reviewed Using: Patient Recall  Updated Prior Medication List: PRINZIDE 20-25 MG TABS (LISINOPRIL-HYDROCHLOROTHIAZIDE) Take 1 tablet by mouth once a day ASPIRIN 81 MG TBEC (ASPIRIN) Take 1 tablet by mouth once a day JANUMET 50-500 MG TABS (SITAGLIPTIN-METFORMIN HCL) Take 1 tablet by mouth two times a day TYLENOL 8 HOUR 650 MG  TBCR (ACETAMINOPHEN) Take 1 tablet by mouth three times a day PROTONIX 40 MG  PACK (PANTOPRAZOLE SODIUM) Take one tablet by mouth daily CADUET 5-20 MG TABS (AMLODIPINE-ATORVASTATIN) Take 1 tablet by mouth once a day AMITRIPTYLINE HCL 25 MG  TABS (AMITRIPTYLINE HCL) Take 1 tablet by mouth at bedtime FREESTYLE LITE TEST   STRP (GLUCOSE BLOOD) use to test blood glucsoe once time a day [BMN] LANCETS   MISC (LANCETS) use to test blood glucose one time a day FREESTYLE CONTROL SOLUTION   LIQD (  BLOOD GLUCOSE CALIBRATION) use to calibrate meter and assure quality readings. [BMN] CLONIDINE HCL 0.1 MG  TABS (CLONIDINE HCL) Take 1 tablet by mouth at bedtime LORATADINE 10 MG TABS (LORATADINE) Take 1 tablet by mouth once a day GUAIFENESIN 400 MG TABS (GUAIFENESIN) take 1 tab by mouth up to every 6 hours as needed for cough AF-PSEUDOEPHEDRINE HCL 30 MG TABS (PSEUDOEPHEDRINE HCL) take 1 by mouth each morning as needed for congestion, for up to 2 weeks  Current Allergies: !  CODEINE ! VICODIN ! DOXYCYCLINE HYCLATE (DOXYCYCLINE HYCLATE)  Past Medical History:    Reviewed history from 04/22/2007 and no changes required:       Diabetes mellitus, type II       Hypertension       Headache       Tobacco use       Hyperlipidemia       Allergic rhinitis       Hx leg cramps on Lipitor       GERD       Uterine fibroids       Hx chest pain : exercise stress test negative 06-07       Perimenopausal       Insomnia       External hemorrhoids       Menopause       Atrophic vaginitis   Family History:    M: died with bad cough 1969 @ 57yo, not sure about health history    S: died with bad cough 1996 @ 57yo, of pneumonia  Social History:    Reviewed history from 11/12/2006 and no changes required:       Current Smoker   Risk Factors:  Tobacco use:  current    Year started:  at the age of 46    Cigarettes:  Yes -- 1-2 cigs daily pack(s) per day    Counseled to quit/cut down tobacco use:  yes Passive smoke exposure:  yes Alcohol use:  no Exercise:  no Seatbelt use:  100 %  Colonoscopy History:    Date of Last Colonoscopy:  03/22/2008  Mammogram History:    Date of Last Mammogram:  03/04/2008  PAP Smear History:    Date of Last PAP Smear:  02/24/2008   Review of Systems  General      See HPI      Complains of sweats and weight loss.      Denies fever.  Eyes      Denies blurring and double vision.      floaters  ENT      Denies decreased hearing and difficulty swallowing.  CV      Denies chest pain or discomfort and palpitations.  Resp      See HPI      Complains of cough and sputum productive.      Denies coughing up blood.  GI      See HPI      Denies diarrhea, nausea, and vomiting.  GU      Denies discharge and dysuria.  MS      Denies joint pain and muscle aches.  Derm      Denies rash.  Neuro      Denies numbness and weakness.  Psych      Denies anxiety and depression.   Physical Exam  General:     alert  and well-developed.   Head:     normocephalic and atraumatic.   Eyes:     vision  grossly intact, pupils equal, pupils round, and pupils reactive to light.   Ears:     no external deformities.   Nose:     no external deformity.   Mouth:     pharynx pink and moist.   Lungs:     normal respiratory effort, normal breath sounds, no crackles, and no wheezes.   Heart:     normal rate, regular rhythm, no murmur, no gallop, no rub, and no JVD.   Abdomen:     soft, non-tender, normal bowel sounds, no distention, no masses, and no guarding.   Neurologic:     alert & oriented X3, cranial nerves II-XII intact, strength normal in all extremities, and gait normal.    Diabetes Management Exam:    Foot Exam (with socks and/or shoes not present):       Sensory-Monofilament:          Left foot: normal          Right foot: normal    Impression & Recommendations:  Problem # 1:  COUGH (ICD-786.2) The differential includes chronic cough due to inflammation from her recent viral illness, perhaps exacerbated by her ACE-inhibitor, pneumonia, TB, and given smoking history and her age, malignancy. Therefore, I will check a chest X-ray today, since there was not one in during the ED visit last month. Due to the holiday, we could not read a PPD placed today. I have contacted Shaqueta Casady, RN [phone], Cell: [phone]) with [facility] about this patient, and they will give her a PPD and collect sputum for AFB. I will fax a copy of this note to her attention at [phone].  I am going to switch her from lisinopril to losartan in case an ACE-I associated cough is complicating the picture. I am giving her a prescription for codine cough syrup as well to treat what may be a chronic post-viral cough. I have instructed her to quit smoking entirely in order to help get relief from her cough. In addition, given the prolonged course of this illness, I am checking CMET and CBC to make sure we are not missing  anything.   Orders: T-Comprehensive Metabolic Panel [phone]) T-CBC w/Diff ([id number]) CXR- 2view (CXR)   Problem # 2:  HYPERTENSION (ICD-401.9) BP well controlled, but I am switching from lisinopril to losartan due to her cough, as above. Concepcion Elk to assist with this.  The following medications were removed from the medication list:    Prinzide 20-25 Mg Tabs (Lisinopril-hydrochlorothiazide) .Marland Kitchen... Take 1 tablet by mouth once a day  Her updated medication list for this problem includes:    Hyzaar 100-25 Mg Tabs (Losartan potassium-hctz) .Marland Kitchen... Take 1 tablet by mouth once a day    Caduet 5-20 Mg Tabs (Amlodipine-atorvastatin) .Marland Kitchen... Take 1 tablet by mouth once a day    Clonidine Hcl 0.1 Mg Tabs (Clonidine hcl) .Marland Kitchen... Take 1 tablet by mouth at bedtime  BP today: 126/81 Prior BP: 131/79 (08/13/2008)  Labs Reviewed: Creat: 0.73 (10/29/2007) Chol: 219 (06/28/2008)   HDL: 46 (06/28/2008)   LDL: 149 (06/28/2008)   TG: 120 (06/28/2008)  Orders: T-Comprehensive Metabolic Panel ([id number]) T-CBC w/Diff ([id number])   Problem # 3:  DIABETES MELLITUS, TYPE II (ICD-250.00) A1c well controlled on Janumet, so continue current regimen. The following medications were removed from the medication list:    Prinzide 20-25 Mg Tabs (Lisinopril-hydrochlorothiazide) .Marland Kitchen... Take 1 tablet by mouth once a day  Her updated medication list for this problem includes:    Hyzaar  100-25 Mg Tabs (Losartan potassium-hctz) .Marland Kitchen... Take 1 tablet by mouth once a day    Aspirin 81 Mg Tbec (Aspirin) .Marland Kitchen... Take 1 tablet by mouth once a day    Janumet 50-500 Mg Tabs (Sitagliptin-metformin hcl) .Marland Kitchen... Take 1 tablet by mouth two times a day  Orders: Capillary Blood Glucose (44010) Fingerstick (27253) T-Comprehensive Metabolic Panel ([id number]) T-CBC w/Diff ([id number])  Labs Reviewed: HgBA1c: 7.1 (06/28/2008)   Creat: 0.73 (10/29/2007)   Microalbumin: 0.43 (06/28/2008)  Last Eye Exam: No diabetic  retinopathy.    (04/15/2008)   Problem # 4:  DYSLIPIDEMIA (ICD-272.4) LDL above goal in sept 2009, and started on atorvastatin at that time. Needs FLP next visit to determine whether statin needs to be titrated.  Her updated medication list for this problem includes:    Caduet 5-20 Mg Tabs (Amlodipine-atorvastatin) .Marland Kitchen... Take 1 tablet by mouth once a day  Labs Reviewed: Chol: 219 (06/28/2008)   HDL: 46 (06/28/2008)   LDL: 149 (06/28/2008)   TG: 120 (06/28/2008) SGOT: 14 (10/29/2007)   SGPT: 20 (10/29/2007)   Complete Medication List: 1)  Hyzaar 100-25 Mg Tabs (Losartan potassium-hctz) .... Take 1 tablet by mouth once a day 2)  Aspirin 81 Mg Tbec (Aspirin) .... Take 1 tablet by mouth once a day 3)  Janumet 50-500 Mg Tabs (Sitagliptin-metformin hcl) .... Take 1 tablet by mouth two times a day 4)  Tylenol 8 Hour 650 Mg Tbcr (Acetaminophen) .... Take 1 tablet by mouth three times a day 5)  Protonix 40 Mg Pack (Pantoprazole sodium) .... Take one tablet by mouth daily 6)  Caduet 5-20 Mg Tabs (Amlodipine-atorvastatin) .... Take 1 tablet by mouth once a day 7)  Amitriptyline Hcl 25 Mg Tabs (Amitriptyline hcl) .... Take 1 tablet by mouth at bedtime 8)  Freestyle Lite Test Strp (Glucose blood) .... Use to test blood glucsoe once time a day 9)  Lancets Misc (Lancets) .... Use to test blood glucose one time a day 10)  Freestyle Control Solution Liqd (Blood glucose calibration) .... Use to calibrate meter and assure quality readings. 11)  Clonidine Hcl 0.1 Mg Tabs (Clonidine hcl) .... Take 1 tablet by mouth at bedtime 12)  Loratadine 10 Mg Tabs (Loratadine) .... Take 1 tablet by mouth once a day 13)  Guaifenesin-codeine 200-10 Mg/76ml Liqd (Guaifenesin-codeine) .... Take 5-27ml by mouth up to every 6 hours as needed for cough   Patient Instructions: 1)  Please schedule a follow-up appointment in 1 month, sooner if your cough does not get better with the cough syrup. 2)  [facility] will come to  your home to do a PPD test and collect sputum specimens to test for tuberculosis. 3)  Take your prescription for Hyzaar to the drug store and when they tell you it is not covered, tell them to call Concepcion Elk in our clinic [phone]) and she will override it. Then you can pick up the Hyzaar tomorrow. 4)  Once you have your Hyzaar, start taking Hyzaar by mouth once daily, and stop taking your Prinzide 20-25 Mg (Lisinopril-hydrochlorothiazide) pills. 5)  Please bring your medications to your next appointment. 6)  Tobacco is very bad for your health and your loved ones! You Should stop smoking!.   Prescriptions: HYZAAR 100-25 MG TABS (LOSARTAN POTASSIUM-HCTZ) Take 1 tablet by mouth once a day  #30 x 1   Entered and Authorized by:   Loel Dubonnet MD   Signed by:   Loel Dubonnet MD on 08/31/2008   Method used:  Print then Give to Patient   RxID:   [id number] GUAIFENESIN-CODEINE 200-10 MG/5ML LIQD (GUAIFENESIN-CODEINE) take 5-37ml by mouth up to every 6 hours as needed for cough  #1 mo. supply x 0   Entered and Authorized by:   Loel Dubonnet MD   Signed by:   Loel Dubonnet MD on 08/31/2008   Method used:   Print then Give to Patient   RxID:   [id number]  ] Last LDL:                                                 149 (06/28/2008 8:37:00 PM)        Diabetic Foot Exam Last Podiatry Exam Date: 08/31/2008  Foot Inspection Is there a history of a foot ulcer?              No Is there a foot ulcer now?              No Can the patient see the bottom of their feet?          Yes Are the shoes appropriate in style and fit?          Yes Is there swelling or an abnormal foot shape?          No Are the toenails long?                No Are the toenails thick?                No Are the toenails ingrown?              No Is there heavy callous build-up?              No Is there a claw toe deformity?                          No      Comments: Corn on bottom of right  foot.   Moderate amount(not heavy) callous build-up on heels of both feet.   10-g (5.07) Semmes-Weinstein Monofilament Test Performed by: Chinita Pester RN          Right Foot          Left Foot Visual Inspection               Test Control      normal         normal Site 1         normal         normal Site 2         normal         normal Site 3         normal         normal Site 4         normal         normal Site 5         normal         normal Site 6         normal         normal Site 7         normal         normal Site 8         normal  normal Site 9         normal         normal  Impression      normal         normal

## 2010-11-07 NOTE — Progress Notes (Signed)
Summary: refill/gg  Phone Note Refill Request  on March 20, 2007 11:01 AM  Refills Requested: Medication #1:  HYDROCHLOROTHIAZIDE 25 MG TABS Take 1 tablet by mouth once a day   Last Refilled: 01/16/2007 labs 6/08  Initial call taken by: Merrie Roof RN,  March 20, 2007 11:01 AM  Follow-up for Phone Call        Refill approved-nurse to complete Follow-up by: Ned Grace MD,  March 20, 2007 11:27 AM  Additional Follow-up for Phone Call Additional follow up Details #1::        Rx faxed to pharmacy Additional Follow-up by: Merrie Roof RN,  March 20, 2007 12:23 PM    Prescriptions: HYDROCHLOROTHIAZIDE 25 MG TABS (HYDROCHLOROTHIAZIDE) Take 1 tablet by mouth once a day  #30 x 5   Entered and Authorized by:   Ned Grace MD   Signed by:   Harriett Sine Phifer MD on 03/20/2007   Method used:   Telephoned to ...         RxID:   [id number]
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
## 2010-11-07 NOTE — Assessment & Plan Note (Signed)
Summary: EST-PAP SMEAR AND REF/MAMMOGRAM/CFB   Vital Signs:  Patient profile:   55 year old female Height:      65 inches (165.10 cm) Weight:      186.2 pounds (84.64 kg) BMI:     31.10 Temp:     96.9 degrees F (36.06 degrees C) oral Pulse rate:   79 / minute BP sitting:   154 / 85  (left arm) Cuff size:   regular  Vitals Entered By: Theotis Barrio NT II (Feb 09, 2010 2:54 PM) CC: PAIN RIGHT THUMB  FOR ABOUT 6 MONTHS / RIGHT EAR PAIN SINCE FRIDAY NIGHT/ HEADACHE SINCE THIS MORNING Is Patient Diabetic? Yes Did you bring your meter with you today? No Pain Assessment Patient in pain? yes     Location: RIGHT THUMB/RIGHT EAR PAIN Intensity:         8 Nutritional Status BMI of 25 - 29 = overweight CBG Result 109  Have you ever been in a relationship where you felt threatened, hurt or afraid?No   Does patient need assistance? Functional Status Self care Ambulation Normal Comments HEADACHE SINCE THIS MORNING,/ RAIN RIGHT THUMB AND RIGHT EAR PAIN    Primary Care Provider:  Blondell Reveal MD  CC:  PAIN RIGHT THUMB  FOR ABOUT 6 MONTHS / RIGHT EAR PAIN SINCE FRIDAY NIGHT/ HEADACHE SINCE THIS MORNING.  History of Present Illness: 55 yo female w/ PMH as mentioned in the EMR  comes to the office for a regular office visit.  1. C/O Right 1st finger pain at MCP joint. She reports that she has noticed a small knot like thing over the right 1st MCP. She denies any problems as far as ROM and she denies any systemic complaints.  2. C/O Right ear ache, ongoing for the last 3-4 days, intermittent. Denies any fever, chills,  but complaints of runny nose, "sinus headaches". She denies any discharges from the ear.   She denies any other complaints. She reports compliancy to all her meds.     Preventive Screening-Counseling & Management  Alcohol-Tobacco     Alcohol drinks/day: 0     Smoking Status: current     Smoking Cessation Counseling: yes     Packs/Day: 1.0     Year Started: at the  age of 16     Passive Smoke Exposure: yes  Caffeine-Diet-Exercise     Does Patient Exercise: no     Type of exercise: walkiing     Times/week: 2  Problems Prior to Update: 1)  Trigger Finger, Right Thumb  (ICD-727.03) 2)  Sebaceous Cyst, Breast  (ICD-610.8) 3)  Preventive Health Care  (ICD-V70.0) 4)  Cigarette Smoker  (ICD-305.1) 5)  Insomnia  (ICD-780.52) 6)  Bell's Palsy, Left  (ICD-351.0) 7)  Allergic Rhinitis  (ICD-477.9) 8)  Adenomatous Colonic Polyp  (ICD-211.3) 9)  Diabetes Mellitus, Type II  (ICD-250.00) 10)  Hypertension  (ICD-401.9) 11)  Hypertensive Retinopathy  (ICD-362.11) 12)  Dyslipidemia  (ICD-272.4) 13)  Pruritus  (ICD-698.9) 14)  Diabetic Peripheral Neuropathy  (ICD-250.60) 15)  Health Maintenance Exam  (ICD-V70.0) 16)  Gerd  (ICD-530.81) 17)  Hx of Leg Cramps  (ICD-729.82) 18)  Fibroids, Uterus  (ICD-218.9) 19)  Perimenopausal Status  (ICD-627.2) 20)  External Hemorrhoids  (ICD-455.3)  Medications Prior to Update: 1)  Hyzaar 100-25 Mg Tabs (Losartan Potassium-Hctz) .... Take 1 Tablet By Mouth Once A Day 2)  Aspirin 81 Mg Tbec (Aspirin) .... Take 1 Tablet By Mouth Once A Day 3)  Janumet 50-500  Mg Tabs (Sitagliptin-Metformin Hcl) .... Take 1 Tablet By Mouth Two Times A Day 4)  Freestyle Lite Test   Strp (Glucose Blood) .... Use To Test Blood Glucsoe Once Time A Day 5)  Lancets   Misc (Lancets) .... Use To Test Blood Glucose One Time A Day 6)  Freestyle Control Solution   Liqd (Blood Glucose Calibration) .... Use To Calibrate Meter and Assure Quality Readings. 7)  Catapres 0.1 Mg Tabs (Clonidine Hcl) .... Take 2 Tablets By Mouth At Bedtime 8)  Prodigy No Coding Blood Gluc  Strp (Glucose Blood) .... Use To Check Your Sugar 1-2 Times A Day 9)  Prodigy Blood Glucose Monitor  Devi (Blood Glucose Monitoring Suppl) .... Use To Check Your Blood Sugar 10)  Premarin 0.625 Mg/gm Crea (Estrogens, Conjugated) .... Apply Once Daily For 2 Weeks. Twice Weekly Thereafter 11)   Claritin 10 Mg Tabs (Loratadine) .... Take 1 Tablet By Mouth Once A Day 12)  Cortisporin 3.5-10000-1 Soln (Neomycin-Polymyxin-Hc) .... 4 Drops Three Times A Day Into Affected Ear. 13)  Pravachol 40 Mg Tabs (Pravastatin Sodium) .... Take 1 Tablet By Mouth Once A Day At Bedtime 14)  Fluticasone Propionate 50 Mcg/act Susp (Fluticasone Propionate) .... 2 Spray Inside Each Nostril Daily. 15)  Nexium 40 Mg Cpdr (Esomeprazole Magnesium) .... Take 1 Tablet By Mouth Once A Day 16)  Pepcid 20 Mg Tabs (Famotidine) .... Take 1 Tablet By Mouth Two Times A Day 17)  Prednisone 10 Mg Tabs (Prednisone) .... Take 3 Tabs By Mouth Today, Then Take 2 Tabs By Mouth Tomorrow, Then Take 1 Tab By Mouth The Next Day. 18)  Diphenhydramine Hcl 25 Mg Caps (Diphenhydramine Hcl) .... Take 2 Tabs By Mouth Every 6 Hours. 19)  Tylenol Extra Strength 500 Mg Tabs (Acetaminophen) .... Take 2 Tabs By Mouth Ever 8 Hours As Needed For Pain. 20)  Fluconazole 150 Mg Tabs (Fluconazole) .... Take 1 Tablet By Mouth Once For Yeast Infection.  Current Medications (verified): 1)  Hyzaar 100-25 Mg Tabs (Losartan Potassium-Hctz) .... Take 1 Tablet By Mouth Once A Day 2)  Aspirin 81 Mg Tbec (Aspirin) .... Take 1 Tablet By Mouth Once A Day 3)  Janumet 50-500 Mg Tabs (Sitagliptin-Metformin Hcl) .... Take 1 Tablet By Mouth Two Times A Day 4)  Freestyle Lite Test   Strp (Glucose Blood) .... Use To Test Blood Glucsoe Once Time A Day 5)  Lancets   Misc (Lancets) .... Use To Test Blood Glucose One Time A Day 6)  Freestyle Control Solution   Liqd (Blood Glucose Calibration) .... Use To Calibrate Meter and Assure Quality Readings. 7)  Catapres 0.1 Mg Tabs (Clonidine Hcl) .... Take 2 Tablets By Mouth At Bedtime 8)  Prodigy No Coding Blood Gluc  Strp (Glucose Blood) .... Use To Check Your Sugar 1-2 Times A Day 9)  Prodigy Blood Glucose Monitor  Devi (Blood Glucose Monitoring Suppl) .... Use To Check Your Blood Sugar 10)  Premarin 0.625 Mg/gm Crea  (Estrogens, Conjugated) .... Apply Once Daily For 2 Weeks. Twice Weekly Thereafter 11)  Claritin 10 Mg Tabs (Loratadine) .... Take 1 Tablet By Mouth Once A Day 12)  Cortisporin 3.5-10000-1 Soln (Neomycin-Polymyxin-Hc) .... 4 Drops Three Times A Day Into Affected Ear. 13)  Pravachol 40 Mg Tabs (Pravastatin Sodium) .... Take 1 Tablet By Mouth Once A Day At Bedtime 14)  Fluticasone Propionate 50 Mcg/act Susp (Fluticasone Propionate) .... 2 Spray Inside Each Nostril Daily. 15)  Nexium 40 Mg Cpdr (Esomeprazole Magnesium) .... Take 1 Tablet  By Mouth Once A Day 16)  Pepcid 20 Mg Tabs (Famotidine) .... Take 1 Tablet By Mouth Two Times A Day 17)  Diphenhydramine Hcl 25 Mg Caps (Diphenhydramine Hcl) .... Take 2 Tabs By Mouth Every 6 Hours. 18)  Tylenol Extra Strength 500 Mg Tabs (Acetaminophen) .... Take 2 Tabs By Mouth Ever 8 Hours As Needed For Pain.  Allergies (verified): 1)  ! Codeine 2)  ! Vicodin 3)  ! Doxycycline Hyclate (Doxycycline Hyclate) 4)  Penicillin V Potassium (Penicillin V Potassium)  Past History:  Family History: Last updated: 09-07-08 M: died with bad cough 1969 @ 57yo, not sure about health history S: died with bad cough 1996 @ 55yo, of pneumonia  Social History: Last updated: 11/12/2006 Current Smoker  Risk Factors: Alcohol Use: 0 (02/09/2010) Exercise: no (02/09/2010)  Risk Factors: Smoking Status: current (02/09/2010) Packs/Day: 1.0 (02/09/2010) Passive Smoke Exposure: yes (02/09/2010)  Past Medical History: Reviewed history from 02/28/2009 and no changes required. L sided Bell's palsy 01/2009 Diabetes mellitus, type II Hypertension Headache Tobacco use Hyperlipidemia Allergic rhinitis Hx leg cramps on Lipitor GERD Uterine fibroids Hx chest pain : exercise stress test negative 06-07 Perimenopausal ? Insomnia External hemorrhoids  Past Surgical History: Reviewed history from 05/11/2009 and no changes required. none  Family History: Reviewed  history from 09/07/2008 and no changes required. M: died with bad cough 1969 @ 57yo, not sure about health history S: died with bad cough 1996 @ 55yo, of pneumonia  Social History: Reviewed history from 11/12/2006 and no changes required. Current Smoker  Review of Systems      See HPI  Physical Exam  General:  alert, no distress. Ears:  right ear mild wax. Mouth:  Oral mucosa and oropharynx without lesions or exudates.  poor dentition. Lungs:  Normal respiratory effort, chest expands symmetrically. Lungs are clear to auscultation, no crackles or wheezes. Heart:  Normal rate and regular rhythm. S1 and S2 normal without gallop, murmur, click, rub or other extra sounds. Msk:  right 1st finger small bony knot like structure noted at MCP. Pulses:  R radial normal.   Extremities:  no edema. Neurologic:  alert & oriented X3, strength normal in all extremities, and gait normal.     Impression & Recommendations:  Problem # 1:  TRIGGER FINGER, RIGHT THUMB (ICD-727.03)  I do palpate a small bony structure that is slightly tender. Will get an Xray.  Orders: T-DG Hand Complete*R* (73130)  Problem # 2:  CIGARETTE SMOKER (ICD-305.1) Councelled her again. She seems not interested at all to quit smoking.  Problem # 3:  DIABETES MELLITUS, TYPE II (ICD-250.00) HbA1C is trending up although within acceptable range. Discussed with patient regarding dietary , life style modifications. Will not change any medicine at this point. Will follow up.  Her updated medication list for this problem includes:    Hyzaar 100-25 Mg Tabs (Losartan potassium-hctz) .Marland Kitchen... Take 1 tablet by mouth once a day    Aspirin 81 Mg Tbec (Aspirin) .Marland Kitchen... Take 1 tablet by mouth once a day    Janumet 50-500 Mg Tabs (Sitagliptin-metformin hcl) .Marland Kitchen... Take 1 tablet by mouth two times a day  Orders: T- Capillary Blood Glucose (86578) T-Hgb A1C (in-house) ([id number])  Labs Reviewed: Creat: 0.77 (11/17/2009)     Last Eye Exam:  No diabetic retinopathy.    (02/08/2009) Reviewed HgBA1c results: 6.9 (02/09/2010)  6.2 (11/16/2009)  Problem # 4:  HYPERTENSION (ICD-401.9) Repeat Manual BP is 142/84. Continue current medications.  Her updated medication list for this  problem includes:    Hyzaar 100-25 Mg Tabs (Losartan potassium-hctz) .Marland Kitchen... Take 1 tablet by mouth once a day    Catapres 0.1 Mg Tabs (Clonidine hcl) .Marland Kitchen... Take 2 tablets by mouth at bedtime  BP today: 154/85 Prior BP: 130/82 (12/22/2009)  Labs Reviewed: K+: 4.2 (11/17/2009) Creat: : 0.77 (11/17/2009)   Chol: 236 (11/17/2009)   HDL: 50 (11/17/2009)   LDL: 129 (11/17/2009)   TG: 284 (11/17/2009)  Complete Medication List: 1)  Hyzaar 100-25 Mg Tabs (Losartan potassium-hctz) .... Take 1 tablet by mouth once a day 2)  Aspirin 81 Mg Tbec (Aspirin) .... Take 1 tablet by mouth once a day 3)  Janumet 50-500 Mg Tabs (Sitagliptin-metformin hcl) .... Take 1 tablet by mouth two times a day 4)  Freestyle Lite Test Strp (Glucose blood) .... Use to test blood glucsoe once time a day 5)  Lancets Misc (Lancets) .... Use to test blood glucose one time a day 6)  Freestyle Control Solution Liqd (Blood glucose calibration) .... Use to calibrate meter and assure quality readings. 7)  Catapres 0.1 Mg Tabs (Clonidine hcl) .... Take 2 tablets by mouth at bedtime 8)  Prodigy No Coding Blood Gluc Strp (Glucose blood) .... Use to check your sugar 1-2 times a day 9)  Prodigy Blood Glucose Monitor Devi (Blood glucose monitoring suppl) .... Use to check your blood sugar 10)  Premarin 0.625 Mg/gm Crea (Estrogens, conjugated) .... Apply once daily for 2 weeks. twice weekly thereafter 11)  Claritin 10 Mg Tabs (Loratadine) .... Take 1 tablet by mouth once a day 12)  Cortisporin 3.5-10000-1 Soln (Neomycin-polymyxin-hc) .... 4 drops three times a day into affected ear. 13)  Pravachol 40 Mg Tabs (Pravastatin sodium) .... Take 1 tablet by mouth once a day at bedtime 14)  Fluticasone  Propionate 50 Mcg/act Susp (Fluticasone propionate) .... 2 spray inside each nostril daily. 15)  Nexium 40 Mg Cpdr (Esomeprazole magnesium) .... Take 1 tablet by mouth once a day 16)  Pepcid 20 Mg Tabs (Famotidine) .... Take 1 tablet by mouth two times a day 17)  Diphenhydramine Hcl 25 Mg Caps (Diphenhydramine hcl) .... Take 2 tabs by mouth every 6 hours. 18)  Tylenol Extra Strength 500 Mg Tabs (Acetaminophen) .... Take 2 tabs by mouth ever 8 hours as needed for pain.  Patient Instructions: 1)  Please schedule a follow-up appointment in 3 months. 2)  Take all the medications as mentioned below. 3)  Tobacco is very bad for your health and your loved ones! You Should stop smoking!. 4)  Stop Smoking Tips: Choose a Quit date. Cut down before the Quit date. decide what you will do as a substitute when you feel the urge to smoke(gum,toothpick,exercise). 5)  You need to lose weight. Consider a lower calorie diet and regular exercise.    Prevention & Chronic Care Immunizations   Influenza vaccine: Not documented   Influenza vaccine deferral: Refused  (05/11/2009)    Tetanus booster: 05/18/2009: Td   Td booster deferral: Deferred  (11/16/2009)    Pneumococcal vaccine: Not documented   Pneumococcal vaccine deferral: Deferred  (11/16/2009)  Colorectal Screening   Hemoccult: Negative  (12/25/2005)    Colonoscopy: One 4 mm polyp in the proximal ascending colon, resected and retrieved.  Two 3-5 mm polyps in the proximal ascending colon, resected and retrieved. Pathology results pending. Exam done by Dr.Vincent Schooler.  (03/22/2008)   Colonoscopy action/deferral: Repeat colonoscopy in 1-3 months.    (01/06/2008)   Colonoscopy due: 03/23/2011  Other Screening  Pap smear: NEGATIVE FOR INTRAEPITHELIAL LESIONS OR MALIGNANCY.  (02/28/2009)   Pap smear due: 02/29/2012    Mammogram: No specific mammographic evidence of malignancy.  Assessment: BIRADS 1.   (03/04/2008)   Mammogram  action/deferral: Ordered  (05/11/2009)   Mammogram due: 03/2009   Smoking status: current  (02/09/2010)   Smoking cessation counseling: yes  (02/09/2010)  Diabetes Mellitus   HgbA1C: 6.9  (02/09/2010)    Eye exam: No diabetic retinopathy.     (02/08/2009)   Eye exam due: 02/08/2010    Foot exam: yes  (08/31/2008)   Foot exam action/deferral: Deferred   High risk foot: No  (06/28/2008)   Foot care education: Done  (05/11/2009)    Urine microalbumin/creatinine ratio: 6.8  (06/28/2008)   Urine microalbumin action/deferral: Deferred   Urine microalbumin/cr due: 06/28/2009  Lipids   Total Cholesterol: 236  (11/17/2009)   Lipid panel action/deferral: Lipid Panel ordered   LDL: 129  (11/17/2009)   LDL Direct: Not documented   HDL: 50  (11/17/2009)   Triglycerides: 284  (11/17/2009)   Lipid panel due: 01/05/2010    SGOT (AST): 17  (11/17/2009)   BMP action: Ordered   SGPT (ALT): 19  (11/17/2009)   Alkaline phosphatase: 70  (11/17/2009)   Total bilirubin: 0.3  (11/17/2009)  Hypertension   Last Blood Pressure: 154 / 85  (02/09/2010)   Serum creatinine: 0.77  (11/17/2009)   BMP action: Ordered   Serum potassium 4.2  (11/17/2009)   Basic metabolic panel due: 01/05/2010  Self-Management Support :   Personal Goals (by the next clinic visit) :     Personal A1C goal: 6  (11/16/2009)     Personal blood pressure goal: 130/80  (05/11/2009)     Personal LDL goal: 70  (11/16/2009)    Patient will work on the following items until the next clinic visit to reach self-care goals:     Medications and monitoring: take my medicines every day, check my blood sugar, bring all of my medications to every visit, examine my feet every day  (02/09/2010)     Eating: drink diet soda or water instead of juice or soda, eat more vegetables, use fresh or frozen vegetables, eat foods that are low in salt, eat baked foods instead of fried foods, eat fruit for snacks and desserts  (02/09/2010)      Activity: take a 30 minute walk every day  (02/09/2010)     Home glucose monitoring frequency: 1 time daily  (11/16/2009)    Diabetes self-management support: Written self-care plan, Education handout, Resources for patients handout  (12/22/2009)   Last diabetes self-management training by diabetes educator: 01/05/2009    Hypertension self-management support: Written self-care plan, Education handout, Resources for patients handout  (12/22/2009)    Lipid self-management support: Written self-care plan, Education handout, Resources for patients handout  (12/22/2009)    Laboratory Results   Blood Tests   Date/Time Received: Feb 09, 2010 3:52 PM  Date/Time Reported: Alric Quan  Feb 09, 2010 3:53 PM   HGBA1C: 6.9%   (Normal Range: Non-Diabetic - 3-6%   Control Diabetic - 6-8%) CBG Random:: 109mg /dL

## 2010-11-07 NOTE — Progress Notes (Signed)
Summary: Dizzy  Phone Note Call from Patient   Caller: Patient Call For: Blondell Reveal MD Summary of Call: Call from pt said that she is having dizzy spells.  Pt says has been going on for a couple of days.  Pt said that she notices it more when she stands up.  Pt said that she has some nausea at times.    No blurred vision.  Pt said that she is takingher B/P medication as prescribed and that her sugars were ok. Pt was offered an appointment for this afternoon at 1:30 PM.  Pt refused saying that her daughter has to go to work.  Pt asked for an appointment on Monday.  Pt was stongly advised to go to the ER or Urgent Care if the problem continues or worsens.  Pt connected with Telecommunicator to schedule an appointment.Angelina Ok RN  June 23, 2010 11:25 AM  Initial call taken by: Angelina Ok RN,  June 23, 2010 11:25 AM  Follow-up for Phone Call        Agree. Follow-up by: Zoila Shutter MD,  June 23, 2010 12:13 PM

## 2010-11-07 NOTE — Consult Note (Signed)
Summary: EN & T : Dr. Ezzard Standing  EN & T : Dr. Ezzard Standing   Imported By: Florinda Marker 07/23/2008 14:55:47  _____________________________________________________________________  External Attachment:    Type:   Image     Comment:   External Document

## 2010-11-07 NOTE — Miscellaneous (Signed)
  Clinical Lists Changes  Problems: Added new problem of ADENOMATOUS COLONIC POLYP (ICD-211.3) - Ascending colon polyps resected on Colonoscopy by Dr. Bosie Clos on 03/22/08 and NO high grade dysplasia  or invasive malignancy identified.

## 2010-11-07 NOTE — Assessment & Plan Note (Signed)
Summary: ACUTE-RIGHT/CFB KNOT ON RT BREAST ITCHING AND BLACK(BOGGALA)/CFB   Vital Signs:  Patient profile:   55 year old female Height:      65 inches (165.10 cm) Weight:      183.8 pounds (83.55 kg) BMI:     30.51 Temp:     96.3 degrees F (35.72 degrees C) oral Pulse rate:   77 / minute BP sitting:   144 / 86  (right arm) Cuff size:   regular  Vitals Entered By: Theotis Barrio NT II (September 27, 2009 4:24 PM) CC: SORE THROAT  SINCE SUNDAY / DM / HAS SMALL "BUMP" ITCHES -UNDER RIGHT  BREAST SINCE SEPT. ? MOLE ? Is Patient Diabetic? Yes Did you bring your meter with you today? Yes Pain Assessment Patient in pain? no      Nutritional Status BMI of 25 - 29 = overweight  Have you ever been in a relationship where you felt threatened, hurt or afraid?No   Does patient need assistance? Functional Status Self care Ambulation Normal Comments SORE THROAT SINCE SUNDAY /HAS SMALL "BUMP" ITCHES- UNDER RIGHT BREAST SICNE SEPT. ? MOLE , BLACKHEAD   Primary Care Provider:  Blondell Reveal MD  CC:  SORE THROAT  SINCE SUNDAY / DM / HAS SMALL "BUMP" ITCHES -UNDER RIGHT  BREAST SINCE SEPT. ? MOLE ?Marland Kitchen  History of Present Illness: 55 year old with Past Medical History: L sided Bell's palsy 01/2009 Diabetes mellitus, type II Hypertension Headache Tobacco use Hyperlipidemia Allergic rhinitis Hx leg cramps on Lipitor GERD Uterine fibroids Hx chest pain : exercise stress test negative 06-07 Perimenopausal ? Insomnia External hemorrhoids  She preset complainig of bump o right breast, she noticed that since September. She squeze balck head, and then got biger. She denies pain, she does relates itchig. She denies discharge from it.  Preventive Screening-Counseling & Management  Alcohol-Tobacco     Alcohol drinks/day: 0     Smoking Status: current     Smoking Cessation Counseling: yes     Packs/Day: 0.25     Year Started: at the age of 27     Passive Smoke Exposure:  yes  Caffeine-Diet-Exercise     Does Patient Exercise: no     Type of exercise: walkiing     Times/week: 2  Current Medications (verified): 1)  Hyzaar 100-25 Mg Tabs (Losartan Potassium-Hctz) .... Take 1 Tablet By Mouth Once A Day 2)  Aspirin 81 Mg Tbec (Aspirin) .... Take 1 Tablet By Mouth Once A Day 3)  Janumet 50-500 Mg Tabs (Sitagliptin-Metformin Hcl) .... Take 1 Tablet By Mouth Two Times A Day 4)  Tylenol 8 Hour 650 Mg  Tbcr (Acetaminophen) .... Take 1 Tablet By Mouth Three Times A Day 5)  Freestyle Lite Test   Strp (Glucose Blood) .... Use To Test Blood Glucsoe Once Time A Day 6)  Lancets   Misc (Lancets) .... Use To Test Blood Glucose One Time A Day 7)  Freestyle Control Solution   Liqd (Blood Glucose Calibration) .... Use To Calibrate Meter and Assure Quality Readings. 8)  Catapres 0.1 Mg Tabs (Clonidine Hcl) .... Take 2 Tablets By Mouth At Bedtime 9)  Prodigy No Coding Blood Gluc  Strp (Glucose Blood) .... Use To Check Your Sugar 1-2 Times A Day 10)  Prodigy Blood Glucose Monitor  Devi (Blood Glucose Monitoring Suppl) .... Use To Check Your Blood Sugar 11)  Premarin 0.625 Mg/gm Crea (Estrogens, Conjugated) .... Apply Once Daily For 2 Weeks. Twice Weekly Thereafter 12)  Claritin  10 Mg Tabs (Loratadine) .... Take 1 Tablet By Mouth Once A Day 13)  Cortisporin 3.5-10000-1 Soln (Neomycin-Polymyxin-Hc) .... 4 Drops Three Times A Day Into Affected Ear. 14)  Flunisolide 0.025 % Soln (Flunisolide) .... 2 Sprays in Each Nostril Twice Daily 15)  Pravachol 40 Mg Tabs (Pravastatin Sodium) .... Take 1 Tablet By Mouth Once A Day At Bedtime  Allergies: 1)  ! Codeine 2)  ! Vicodin 3)  ! Doxycycline Hyclate (Doxycycline Hyclate)  Review of Systems  The patient denies fever, chest pain, syncope, dyspnea on exertion, peripheral edema, prolonged cough, headaches, hemoptysis, abdominal pain, melena, and hematochezia.    Physical Exam  General:  alert, well-developed, and well-nourished.    Head:  normocephalic and atraumatic.   Lungs:  normal respiratory effort, no intercostal retractions, no accessory muscle use, and normal breath sounds.   Heart:  normal rate and regular rhythm.   Abdomen:  soft, non-tender, normal bowel sounds, and no distention.   Skin:  1mm round skin lesion, with black head in the center. no drainage no redness, located below right breast.    Impression & Recommendations:  Problem # 1:  SEBACEOUS CYST, BREAST (ICD-610.8) She has small 1 mm sebaceous cyst. This look bening. I will refer her to dermatologist because patient complaniig of itchig and want to have it removed.  Orders: Dermatology Referral (Derma)  Complete Medication List: 1)  Hyzaar 100-25 Mg Tabs (Losartan potassium-hctz) .... Take 1 tablet by mouth once a day 2)  Aspirin 81 Mg Tbec (Aspirin) .... Take 1 tablet by mouth once a day 3)  Janumet 50-500 Mg Tabs (Sitagliptin-metformin hcl) .... Take 1 tablet by mouth two times a day 4)  Tylenol 8 Hour 650 Mg Tbcr (Acetaminophen) .... Take 1 tablet by mouth three times a day 5)  Freestyle Lite Test Strp (Glucose blood) .... Use to test blood glucsoe once time a day 6)  Lancets Misc (Lancets) .... Use to test blood glucose one time a day 7)  Freestyle Control Solution Liqd (Blood glucose calibration) .... Use to calibrate meter and assure quality readings. 8)  Catapres 0.1 Mg Tabs (Clonidine hcl) .... Take 2 tablets by mouth at bedtime 9)  Prodigy No Coding Blood Gluc Strp (Glucose blood) .... Use to check your sugar 1-2 times a day 10)  Prodigy Blood Glucose Monitor Devi (Blood glucose monitoring suppl) .... Use to check your blood sugar 11)  Premarin 0.625 Mg/gm Crea (Estrogens, conjugated) .... Apply once daily for 2 weeks. twice weekly thereafter 12)  Claritin 10 Mg Tabs (Loratadine) .... Take 1 tablet by mouth once a day 13)  Cortisporin 3.5-10000-1 Soln (Neomycin-polymyxin-hc) .... 4 drops three times a day into affected ear. 14)   Flunisolide 0.025 % Soln (Flunisolide) .... 2 sprays in each nostril twice daily 15)  Pravachol 40 Mg Tabs (Pravastatin sodium) .... Take 1 tablet by mouth once a day at bedtime  Other Orders: T- Capillary Blood Glucose ([id number]) T-Hgb A1C (in-house) ([id number])  Patient Instructions: 1)  Please schedule a follow-up appointment in 1 month with PCP.

## 2010-11-07 NOTE — Progress Notes (Signed)
Summary: Prior Authorization- Approval    Prior Authorization approved for patient Pantoprazole 40mg , once daily. Prior Authorization approved. Authorization is for 10/14/17-10/14/08. [facility]) notified to reprocess claim...................................................................Marland KitchenConcepcion Elk  October 15, 2007 9:12 AM

## 2010-11-07 NOTE — Procedures (Signed)
Summary[facility]: Dr. Myrtie Cruise [facility]: Dr. Bosie Clos   Imported By: Florinda Marker 12/15/2007 15:12:00  _____________________________________________________________________  External Attachment:    Type:   Image     Comment:   External Document

## 2010-11-07 NOTE — Miscellaneous (Signed)
Summary: HIPAA Restrictions  HIPAA Restrictions   Imported By: Florinda Marker 08/03/2008 14:08:07  _____________________________________________________________________  External Attachment:    Type:   Image     Comment:   External Document

## 2010-11-07 NOTE — Assessment & Plan Note (Signed)
Summary: medication change/cfb   Vital Signs:  Patient profile:   55 year old female Height:      67 inches (170.18 cm) Weight:      181.3 pounds (82.41 kg) BMI:     28.50 Temp:     98.7 degrees F (37.06 degrees C) oral Pulse rate:   76 / minute BP sitting:   129 / 81  (right arm)  Vitals Entered By: Filomena Jungling NT II (Feb 14, 2009 2:13 PM) CC: STOPPED UP NOSE /? CADUET  MAKES HER FEEL BAD Is Patient Diabetic? Yes  Pain Assessment Patient in pain? no      Nutritional Status BMI of 25 - 29 = overweight  Have you ever been in a relationship where you felt threatened, hurt or afraid?No   Does patient need assistance? Functional Status Self care Ambulation Normal   Primary Care Provider:  Blondell Reveal MD  CC:  STOPPED UP NOSE /? CADUET  MAKES HER FEEL BAD.  History of Present Illness: Bridget Owens is a 55 y/o woman with DM, HTN, HLPD and a recent episode of Bell's palsy. She comes here today to let us know that she stopped taking Caduet last week b/c it made her feel bad. She knows that it's the medication that was doing it b/c when she stopped it she was no longer dizzy.  Stopped itching on her shins - took hydroxyzine.  C/o being stopped up in her nose with sneezing. No cough, wheezing.  Preventive Screening-Counseling & Management     Smoking Status: current     Smoking Cessation Counseling: yes     Packs/Day: 0.5     Year Started: at the age of 75     Passive Smoke Exposure: yes     Does Patient Exercise: no     Type of exercise: walkiing     Times/week: 2  Allergies: 1)  ! Codeine 2)  ! Vicodin 3)  ! Doxycycline Hyclate (Doxycycline Hyclate)  Review of Systems General:  Denies chills and fever. Eyes:  Denies blurring and double vision. ENT:  Complains of nasal congestion and sinus pressure; denies postnasal drainage. CV:  Denies chest pain or discomfort and shortness of breath with exertion. Resp:  Denies cough, shortness of breath, and wheezing. GI:   Denies change in bowel habits, nausea, and vomiting. GU:  Denies discharge and hematuria. MS:  Denies joint pain, joint redness, and joint swelling. Derm:  Denies itching, lesion(s), and rash. Neuro:  Denies falling down, headaches, and inability to speak. Psych:  Denies anxiety and depression. Endo:  Denies excessive hunger. Heme:  Denies abnormal bruising and bleeding. Allergy:  Complains of itching eyes, seasonal allergies, and sneezing.  Physical Exam  General:  Middle-aged woman in NAD. alert, well-developed, well-nourished, and well-hydrated.   Head:  atraumatic.   Eyes:  vision grossly intact, pupils equal, pupils round, and pupils reactive to light.  Ears:  no external deformities.   Nose:  no external deformity, no external erythema, and no nasal discharge. She does have mucosal edema in both nostrils. Mouth:  OP clear, MMM. Neck:  supple, full ROM, and no masses.   Lungs:  normal respiratory effort, no intercostal retractions, no accessory muscle use, normal breath sounds, no crackles, and no wheezes.   Heart:  normal rate, regular rhythm, and no murmur.   Abdomen:  soft, non-tender, normal bowel sounds, no distention, no masses, no guarding, and no rigidity.   Extremities:  No e/c/c. Neurologic:  alert &  oriented X3. Much improvement in L facial paresis/paralysis. Patient now able to close her L eyelid completely she has more mobility but has some residual asymetry.alert & oriented X3, strength normal in all extremities, and gait normal.   Skin:  no rashes and no suspicious lesions.   Psych:  Oriented X3, memory intact for recent and remote, good eye contact, easily distracted, and hyperactive.     Impression & Recommendations:  Problem # 1:  HYPERTENSION (ICD-401.9) She stopped Caduet last week and her bP remains well controlled. Will not make any changes at this point.  The following medications were removed from the medication list:    Caduet 5-20 Mg Tabs  (Amlodipine-atorvastatin) .Marland Kitchen... Take 1 tablet by mouth once a day Her updated medication list for this problem includes:    Hyzaar 100-25 Mg Tabs (Losartan potassium-hctz) .Marland Kitchen... Take 1 tablet by mouth once a day    Catapres 0.1 Mg Tabs (Clonidine hcl) .Marland Kitchen... Take 1 tab by mouth at bedtime  BP today: 129/81 Prior BP: 122/79 (02/03/2009)  Labs Reviewed: K+: 4.1 (01/05/2009) Creat: : 0.74 (01/05/2009)   Chol: 155 (01/05/2009)   HDL: 44 (01/05/2009)   LDL: 94 (01/05/2009)   TG: 83 (01/05/2009)  Problem # 2:  ALLERGIC RHINITIS (ICD-477.9) Pt's nasal congestion and sneezing are 2/2 allergic rhinitis. She hasn't been taking the steroid nose spray that she was prescribed in the past b/c it made her feel weird. She wasn't aware that Dr. Noel Gerold had refilled her Zyrtec prescription on 01/05/2009. Will refill it for her today. Advised her to use it two times a day to get optimal symptom control.  The following medications were removed from the medication list:    Fluticasone Propionate 50 Mcg/act Susp (Fluticasone propionate) .Marland Kitchen... Take 2 sprays in each nostril once daily for 3 days, then once in each nostril daily for 2 weeks  Problem # 3:  BELL'S PALSY, LEFT (ICD-351.0) She completed a prednisone and Valtrex course. Much clinical improvement since I saw her last week.  Problem # 4:  DIABETES MELLITUS, TYPE II (ICD-250.00) At goal.  Her updated medication list for this problem includes:    Hyzaar 100-25 Mg Tabs (Losartan potassium-hctz) .Marland Kitchen... Take 1 tablet by mouth once a day    Aspirin 81 Mg Tbec (Aspirin) .Marland Kitchen... Take 1 tablet by mouth once a day    Janumet 50-500 Mg Tabs (Sitagliptin-metformin hcl) .Marland Kitchen... Take 1 tablet by mouth two times a day  Labs Reviewed: Creat: 0.74 (01/05/2009)     Last Eye Exam: No diabetic retinopathy.    (04/15/2008) Reviewed HgBA1c results: 7.2 (01/05/2009)  6.9 (09/21/2008)  Problem # 5:  DYSLIPIDEMIA (ICD-272.4) Pt d/c'd the Caduet last week b/c it made her feel  weird. Will need to monitor her FLP on her next visit to see if she needs to be started back on a statin.  The following medications were removed from the medication list:    Caduet 5-20 Mg Tabs (Amlodipine-atorvastatin) .Marland Kitchen... Take 1 tablet by mouth once a day  Labs Reviewed: SGOT: 14 (01/05/2009)   SGPT: 16 (01/05/2009)   HDL:44 (01/05/2009), 46 (06/28/2008)  LDL:94 (01/05/2009), 149 (31/51/7616)  Chol:155 (01/05/2009), 219 (06/28/2008)  Trig:83 (01/05/2009), 120 (06/28/2008)  Problem # 6:  PRURITUS (ICD-698.9) Resolved R shin pruritus.  Complete Medication List: 1)  Hyzaar 100-25 Mg Tabs (Losartan potassium-hctz) .... Take 1 tablet by mouth once a day 2)  Aspirin 81 Mg Tbec (Aspirin) .... Take 1 tablet by mouth once a day 3)  Janumet  50-500 Mg Tabs (Sitagliptin-metformin hcl) .... Take 1 tablet by mouth two times a day 4)  Tylenol 8 Hour 650 Mg Tbcr (Acetaminophen) .... Take 1 tablet by mouth three times a day 5)  Freestyle Lite Test Strp (Glucose blood) .... Use to test blood glucsoe once time a day 6)  Lancets Misc (Lancets) .... Use to test blood glucose one time a day 7)  Freestyle Control Solution Liqd (Blood glucose calibration) .... Use to calibrate meter and assure quality readings. 8)  Catapres 0.1 Mg Tabs (Clonidine hcl) .... Take 1 tab by mouth at bedtime 9)  Zyrtec-d Allergy & Congestion 5-120 Mg Xr12h-tab (Cetirizine-pseudoephedrine) .... Take 1 tablet by mouth two times a day 10)  Prodigy No Coding Blood Gluc Strp (Glucose blood) .... Use to check your sugar 1-2 times a day 11)  Prodigy Blood Glucose Monitor Devi (Blood glucose monitoring suppl) .... Use to check your blood sugar 12)  Nicotrol 10 Mg Inha (Nicotine) .... Use as often as needed whenever you have urge to smoke cig. use a minimum of 6 cartiges per day and a maximum of 16. 13)  Hydroxyzine Hcl 25 Mg Tabs (Hydroxyzine hcl) .... Take one tab by mouth every 6 to 8 hours as needed for itching.  Patient Instructions:  1)  Go to [facility] to pick up the Zyrtec for your allergies (take it two times a day). Prescriptions: ZYRTEC-D ALLERGY & CONGESTION 5-120 MG XR12H-TAB (CETIRIZINE-PSEUDOEPHEDRINE) Take 1 tablet by mouth two times a day  #60 x 3   Entered and Authorized by:   Olene Craven MD   Signed by:   Olene Craven MD on 02/14/2009   Method used:   Electronically to        [facility] [id number] (retail)       [address]       [place], [place]  [address]       Ph: [phone]       Fax: [phone]   RxID:   [id number]

## 2010-11-07 NOTE — Assessment & Plan Note (Signed)
Summary: (Bridget Owens) SICK IN BODY/ SB.   Vital Signs:  Patient Profile:   55 Years Old Female Weight:      188.0 pounds (85.45 kg) Temp:     97.2 degrees F (36.22 degrees C) oral Pulse rate:   93 / minute BP sitting:   138 / 89  (right arm)  Pt. in pain?   no  Vitals Entered By: Krystal Eaton Duncan Dull) (November 12, 2006 11:28 AM)              Is Patient Diabetic? Yes  Nutritional Status Normal  Have you ever been in a relationship where you felt threatened, hurt or afraid?No   Does patient need assistance? Functional Status Self care Ambulation Normal   Chief Complaint:  pt c/o elevated  blood sugars.  History of Present Illness: Pt is a 55 y/o woman with PMHx of HTN and DM-II who presents to clinic today after having had a CBG in the 260-range one week ago. Since her diabetes was dx in 2000-2001, she has been taking metformin 500 two times a day and has been checking her CBGs two times a day. She typically has CBGs in the 120-130 range. Last week when she had that elevated value, she was feeling ''stressed out'' from her perimenopausal hot flashes but was otherwise asymptomatic from the hyperglycemia. She currently has an URTI.    Prior Medications (reviewed today): HYDROCHLOROTHIAZIDE 25 MG TABS (HYDROCHLOROTHIAZIDE) Take 1 tablet by mouth once a day COZAAR 100 MG TABS (LOSARTAN POTASSIUM) Take 1 tablet by mouth once a day ASPIRIN 81 MG TBEC (ASPIRIN) Take 1 tablet by mouth once a day ALLEGRA 180 MG TABS (FEXOFENADINE HCL) Take 1 tablet by mouth once a day NEXIUM 40 MG PACK (ESOMEPRAZOLE MAGNESIUM) Take 1 tablet by mouth once a day METFORMIN HCL 500 MG TABS (METFORMIN HCL) Take 1 tablet by mouth two times a day Current Allergies (reviewed today): ! PCN ! CODEINE ! VICODIN   Social History:    Current Smoker   Risk Factors:  Tobacco use:  current    Cigarettes:  Yes -- 1/2 pack(s) per day    Counseled to quit/cut down tobacco use:  yes   Review of Systems     The patient complains of sweats.  The patient denies fever, chills, and weight loss.    ENT      Complains of earache and sore throat.      Denies decreased hearing, difficulty swallowing, and ringing in ears.  Resp      Denies chest discomfort, cough, and shortness of breath.  Psych      Denies irritability.   Physical Exam  General:     NAD Head:     normocephalic and atraumatic.   Ears:     R TM normal Mouth:     pharynx pink and moist and no exudates.  Mild erythema. Lungs:     CTAB with good air mvt Heart:     normal rate, regular rhythm, no murmur, no gallop, and no rub.   Abdomen:     Obese, soft, NT, ND. Extremities:     Nicotine staining. No clubbing    Impression & Recommendations:  Problem # 1:  DIABETES MELLITUS, TYPE II (ICD-250.00) Since her A1c is still optimal at 6.3% today, I will not make any changes to her metformin regimen. Her CBG was probably elevated because of emotional and physical stress. If she becomes symptomatic from a hyper or a hypoglycemia, she needs  to contact the clinic. Her updated medication list for this problem includes:    Cozaar 100 Mg Tabs (Losartan potassium) .Marland Kitchen... Take 1 tablet by mouth once a day    Aspirin 81 Mg Tbec (Aspirin) .Marland Kitchen... Take 1 tablet by mouth once a day    Metformin Hcl 500 Mg Tabs (Metformin hcl) .Marland Kitchen... Take 1 tablet by mouth two times a day  Orders: T-Hgb A1C (in-house) (84132GM) T- Capillary Blood Glucose (01027) F/u with PCP at regular intervals.  Problem # 2:  SMOKER (ICD-305.1) Patient admitted she would like nicotine patches to stop smoking but stated that ''her doctor'' didn't want to give them to her. I saw no comment regarding smoking cessation in previous notes but I will defer to a regular visit to discuss the different smoking cessation interventions possible.   Problem # 3:  PERIMENOPAUSAL STATUS (ICD-627.2) Patient still complaining of perimenopausal symptoms notably irritability and hot  flashes. Would like something to help her deal with her symptoms. I explained my hesitancy to write for hormone replacement therapy. When she returns to see her PCP, different options for symptomatic relief will have to be discussed.   Patient Instructions: 1)  Please schedule a follow-up appointment in 1 month with Dr. Lorel Monaco (primary care physician).     Vital Signs:  Patient Profile:   55 Years Old Female Weight:      188.0 pounds (85.45 kg) Temp:     97.2 degrees F (36.22 degrees C) oral Pulse rate:   93 / minute BP sitting:   138 / 89             Last PAP Result Normal PapHx  Normal (01/07/2004 6:57:29 PM)     Laboratory Results      Date/Time Recieved:  November 12, 2006 11:57 AM  Date/Time Reported:  November 12, 2006 11:57 AM ..................................................................Marland KitchenAlric Quan  November 12, 2006 11:57 AM   Blood Tests Glucose (random): 199 mg/dL   (Normal Range: 25-366) HGBA1C: 6.3%   (Normal Range: Non-Diabetic - 3-6%   Control Diabetic - 6-8%)   Other Tests

## 2010-11-07 NOTE — Miscellaneous (Signed)
  Clinical Lists Changes 

## 2010-11-07 NOTE — Progress Notes (Signed)
Summary: Kot on thumb  Phone Note Call from Patient   Caller: Patient Call For: Blondell Reveal MD Summary of Call: Call from pt says that she has a knot on her left thumb.  Reddened area, painful tender. 8 on pain scale. Knot has been on her thumb since Monday.  Pt to be given an appointmnet next available. Angelina Ok RN  November 03, 2009 4:12 PM  Initial call taken by: Angelina Ok RN,  November 03, 2009 4:12 PM

## 2010-11-07 NOTE — Assessment & Plan Note (Signed)
Summary: diabetes/dmr   Allergies: 1)  ! Codeine 2)  ! Vicodin 3)  ! Doxycycline Hyclate (Doxycycline Hyclate)   Complete Medication List: 1)  Hyzaar 100-25 Mg Tabs (Losartan potassium-hctz) .... Take 1 tablet by mouth once a day 2)  Aspirin 81 Mg Tbec (Aspirin) .... Take 1 tablet by mouth once a day 3)  Janumet 50-500 Mg Tabs (Sitagliptin-metformin hcl) .... Take 1 tablet by mouth two times a day 4)  Tylenol 8 Hour 650 Mg Tbcr (Acetaminophen) .... Take 1 tablet by mouth three times a day 5)  Caduet 5-20 Mg Tabs (Amlodipine-atorvastatin) .... Take 1 tablet by mouth once a day 6)  Freestyle Lite Test Strp (Glucose blood) .... Use to test blood glucsoe once time a day 7)  Lancets Misc (Lancets) .... Use to test blood glucose one time a day 8)  Freestyle Control Solution Liqd (Blood glucose calibration) .... Use to calibrate meter and assure quality readings. 9)  Catapres 0.1 Mg Tabs (Clonidine hcl) .... Take 1 tab by mouth at bedtime 10)  Fluticasone Propionate 50 Mcg/act Susp (Fluticasone propionate) .... Take 2 sprays in each nostril once daily for 3 days, then once in each nostril daily for 2 weeks 11)  Zyrtec-d Allergy & Congestion 5-120 Mg Xr12h-tab (Cetirizine-pseudoephedrine) .... Take 1 tablet by mouth two times a day 12)  Prodigy No Coding Blood Gluc Strp (Glucose blood) .... Use to check your sugar 1-2 times a day 13)  Prodigy Blood Glucose Monitor Devi (Blood glucose monitoring suppl) .... Use to check your blood sugar  Other Orders: No Charge Patient Arrived (NCPA0) (NCPA0)       Diabetes Self Management Training  PCP:  Blondell Reveal MD Diabetes Type: Type 2 non-insulin treated Current smoking Status: current  Assessment Daily activities: stopped walking due to weather  Diabetes Medications:  Comments: provided patient wiht lancing device today and explained her A1C results.    Monitoring Name of Meter  Prodigy Autocode      Time of Testing  After  Breakfast  Recent Episodes of: Hyperglycemia : No Hypoglycemia: No Severe Hypoglycemia : No  Other Assessment Performs daily self-foot exams: Yes      Activity Limitations  Inadequate physical activity  Monitoring State purpose and frequency of monitoring BG-ketones-HgbA1C and when to contact health care team with results: Needs review/assistance Perform glucose monitoring/ketone testing and record results correctly: Demonstrates competency State target blood glucose and HgbA1C goals: Needs review/assistance  Complications State the causes-signs and symptoms and prevention of Hyperglycemia: Needs review/assistance Explain proper treatment of hyperglycemia: Needs review/assistance  Exercise States importance of exercise: Demonstrates competency States effect of exercise on blood glucose: Needs review/assistance Verbalizes safety measures for exercise related to diabetes: Demonstrates competency  Psychosocial Adjustment Diabetes Management Education Done: 01/05/2009    BEHAVIORAL GOALS INITIAL Incorporating physical activity into lifestyle: start walking every day again        Diabetes Self Management Support: clinic staff Follow-up:3 months  Last LDL:                                                 149 (06/28/2008 8:37:00 PM)

## 2010-11-07 NOTE — Assessment & Plan Note (Signed)
Summary: EAR / SB.   Vital Signs:  Patient Profile:   55 Years Old Female Height:     67 inches (170.18 cm) Weight:      1588.6 pounds (722.09 kg) BMI:     249.71 Temp:     98.7 degrees F (37.06 degrees C) oral Pulse rate:   92 / minute BP sitting:   140 / 88  Pt. in pain?   no  Vitals Entered By: Theotis Barrio (May 23, 2007 10:43 AM)              Is Patient Diabetic? Yes  Nutritional Status NORAML CBG Result 127  Have you ever been in a relationship where you felt threatened, hurt or afraid?No   Does patient need assistance? Functional Status Self care Ambulation Normal Comments PATIENT STATES THAT ON MONDAY SHE DID A LOT OF LIFTING AND PULLING  AT THE HOUSE.   Chief Complaint:  BILATERAL EAR PAIN FOR ABOUT TWO MONTHS / CHEST PAIN STARTED MONDAY.  History of Present Illness: 55 y/o that come in for sore throat, cough and difficulty swallowing for 5 days. Also some fullness of her ears.  She relates that at first she could eat normally now it is difficult b/ he throat bothers her to much. She relates no fever, but feel hot at times. She relates no sick contact, but is around her grandsons most of the time.  Diabetes Management History:      She has not been enrolled in the "Diabetic Education Program".  She states lack of understanding of dietary principles and is not following her diet appropriately.  No sensory loss is reported.  Self foot exams are not being performed.  She is not checking home blood sugars.  She says that she is exercising.  Type of exercise includes: walkiing.  She is doing this 2 times per week.        Hypoglycemic symptoms are not occurring.  No hyperglycemic symptoms are reported.  Other comments include: Pt has MR and is hard for her to keep track.        There are no symptoms to suggest diabetic complications.  Since her last visit, no infections have occurred.  She's had no treatment plan problems.  No changes have been made to her treatment  plan since last visit.     Current Allergies: ! PCN ! CODEINE ! VICODIN    Risk Factors:  Tobacco use:  current    Year started:  at the age of 58    Cigarettes:  Yes -- 1/2 pack(s) per day Alcohol use:  no Exercise:  yes    Times per week:  2    Type:  walkiing Seatbelt use:  100 %  Mammogram History:    Date of Last Mammogram:  01/02/2006  PAP Smear History:    Date of Last PAP Smear:  01/07/2004   Review of Systems  The patient denies weight loss, vision loss, decreased hearing, hoarseness, syncope, dyspnea on exhertion, peripheral edema, prolonged cough, hemoptysis, and abdominal pain.     Physical Exam  General:     Well-developed,well-nourished,in no acute distress; alert,appropriate and cooperative throughout examination Mouth:     fair dentition, tonsil hypertropied, and pharyngeal erythema, no exudates.    Impression & Recommendations:  Problem # 1:  PHARYNGITIS, ACUTE (ICD-462) Assessment: New Will get strep throat. Will start her on erythromycin empirically. Will give her some tylenol for fevers.  Her updated medication list for this problem includes:  Aspirin 81 Mg Tbec (Aspirin) .Marland Kitchen... Take 1 tablet by mouth once a day    Tylenol 8 Hour 650 Mg Tbcr (Acetaminophen) .Marland Kitchen... Take 1 tablet by mouth three times a day    E.e.s. 400 400 Mg Tabs (Erythromycin ethylsuccinate) .Marland Kitchen... Take 1 tablet by mouth four times a day  Orders: T-Culture, Rapid Strep ([id number])   Problem # 2:  DIABETES MELLITUS, TYPE II (ICD-250.00) Assessment: Unchanged Pt is not feeling any symptoms of hypoglycemia or hyperglycemia, will increase her metformin on next visit. Her updated medication list for this problem includes:    Cozaar 100 Mg Tabs (Losartan potassium) .Marland Kitchen... Take 1 tablet by mouth once a day    Aspirin 81 Mg Tbec (Aspirin) .Marland Kitchen... Take 1 tablet by mouth once a day    Metformin Hcl 500 Mg Tabs (Metformin hcl) .Marland Kitchen... Take 1 tablet by mouth two times a day   Orders: T- Capillary Blood Glucose (82948) T-Hgb A1C (in-house) (09811BJ)   Problem # 3:  HYPERTENSION (ICD-401.9) Assessment: Improved Bp is slightly above desire level if on next visit is above 130>80 will add another medication like a beta blocker. Her updated medication list for this problem includes:    Hydrochlorothiazide 25 Mg Tabs (Hydrochlorothiazide) .Marland Kitchen... Take 1 tablet by mouth once a day    Cozaar 100 Mg Tabs (Losartan potassium) .Marland Kitchen... Take 1 tablet by mouth once a day   Complete Medication List: 1)  Hydrochlorothiazide 25 Mg Tabs (Hydrochlorothiazide) .... Take 1 tablet by mouth once a day 2)  Cozaar 100 Mg Tabs (Losartan potassium) .... Take 1 tablet by mouth once a day 3)  Aspirin 81 Mg Tbec (Aspirin) .... Take 1 tablet by mouth once a day 4)  Metformin Hcl 500 Mg Tabs (Metformin hcl) .... Take 1 tablet by mouth two times a day 5)  Nicoderm Cq 14 Mg/24hr Pt24 (Nicotine) .... Apply one patch daily 6)  Tylenol 8 Hour 650 Mg Tbcr (Acetaminophen) .... Take 1 tablet by mouth three times a day 7)  Cyclobenzaprine Hcl 10 Mg Tabs (Cyclobenzaprine hcl) .... Take 1 tablet by mouth every 8 hours as needed for muscle spasm 8)  Premarin 0.625 Mg/gm Crea (Estrogens, conjugated) .... Apply 0.5 mg intravaginally daily 9)  E.e.s. 400 400 Mg Tabs (Erythromycin ethylsuccinate) .... Take 1 tablet by mouth four times a day 10)  Protonix 40 Mg Pack (Pantoprazole sodium)  Diabetes Management Assessment/Plan:      The following lipid goals have been established for the patient: Total cholesterol goal of 200; LDL cholesterol goal of 100; HDL cholesterol goal of 40; Triglyceride goal of 200.     Patient Instructions: 1)  Take your medication every 8 hours for 7 days. 2)  Please schedule an appointment with your primary doctor in : 67month. 3)  Take protonix daily.    Prescriptions: PROTONIX 40 MG  PACK (PANTOPRAZOLE SODIUM)   #31 x 6   Entered and Authorized by:   Marinda Elk MD    Signed by:   Marinda Elk MD on 05/23/2007   Method used:   Electronically sent to ...       [facility] [address]       [address] [place], [place]  [address]       Ph: [phone]       Fax: [phone]   RxID:   [id number] E.E.S. 400 400 MG  TABS (ERYTHROMYCIN  ETHYLSUCCINATE) Take 1 tablet by mouth four times a day  #32 x 0   Entered and Authorized by:   Marinda Elk MD   Signed by:   Marinda Elk MD on 05/23/2007   Method used:   Electronically sent to ...       [facility] [address]*       [address] [place], [place]  [address]       Ph: [phone]       Fax: [phone]   RxID:   [id number]       Laboratory Results   Blood Tests   Date/Time Recieved: May 23, 2007 11:41 AM  Date/Time Reported: ..................................................................Marland KitchenAlric Quan  May 23, 2007 11:41 AM   HGBA1C: 7.0%   (Normal Range: Non-Diabetic - 3-6%   Control Diabetic - 6-8%) CBG Random: 127  Comments: cbg of 165  collected with A1C @1135a   ..................................................................Marland KitchenAlric Quan  May 23, 2007 11:42 AM

## 2010-11-07 NOTE — Miscellaneous (Signed)
  Clinical Lists Changes  Medications: Changed medication from PROTONIX 40 MG  PACK (PANTOPRAZOLE SODIUM) to PROTONIX 40 MG  PACK (PANTOPRAZOLE SODIUM) Take one tablet by mouth daily - Signed Rx of PROTONIX 40 MG  PACK (PANTOPRAZOLE SODIUM) Take one tablet by mouth daily;  #31 x 0;  Signed;  Entered by: Manning Charity MD;  Authorized by: Manning Charity MD;  Method used: Electronic    Prescriptions: PROTONIX 40 MG  PACK (PANTOPRAZOLE SODIUM) Take one tablet by mouth daily  #31 x 0   Entered and Authorized by:   Manning Charity MD   Signed by:   Manning Charity MD on 05/28/2007   Method used:   Electronically sent to ...       [facility] [address]       [address] [place], [place]  [address]       Ph: [phone]       Fax: [phone]   RxID:   [id number]   Protonix prescribed electronically by Dr. Radonna Ricker failed to transmit secondary to lack of instructions.  I will resend the prescription although there is no mention in Dr. Louanne Belton note to address why it was started.  Copy sent to him so he can address.

## 2010-11-07 NOTE — Progress Notes (Signed)
Summary: phone/gg   ***  Phone Note Call from Patient   Summary of Call: Pt c/o 5 days of heart burn, pain in back and chest ( gas pain) denies SOB, nausea.  Pain is on and off. also is up in throat.  Pt # [id number] Please advise Initial call taken by: Merrie Roof RN,  October 08, 2007 10:39 AM  Follow-up for Phone Call        She can take an additional Protonix tablet in the evening, and schedule an appt next week. She should come into the Emergency Dept (or Urgent Care) if her symptoms worsen before her visit since we are closed until next week. I will give extra Protonix tablets but I will not change the directions on her med list since this is temporary.  Follow-up by: Eliseo Gum MD,  October 08, 2007 10:50 AM  Additional Follow-up for Phone Call Additional follow up Details #1::        Pt informed.  She has NOT been taking her protonix but will restart.  She will let us know how well it works. Additional Follow-up by: Merrie Roof RN,  October 08, 2007 1:11 PM      Prescriptions: PROTONIX 40 MG  PACK (PANTOPRAZOLE SODIUM) Take one tablet by mouth daily  #62 x 0   Entered and Authorized by:   Eliseo Gum MD   Signed by:   Eliseo Gum MD on 10/08/2007   Method used:   Telephoned to ...       [facility] [id number]       [address]       [place], [place]  [address]       Ph: [phone]       Fax: [phone]   RxID:   [id number]

## 2010-11-07 NOTE — Progress Notes (Signed)
  Phone Note Outgoing Call   Call placed by: Theotis Barrio NT II,  August 24, 2009 2:07 PM Call placed to: Specialist Details for Reason: GYN APPT WITH [facility] Summary of Call: CALLED DR HARPER'S OFFICE, MS Tiaria HAD MISSED HER APPT. NO SHOW X 2 . CALLED THE PATIENT SHE HAD DEATH IN FAMILY AND FORGOT THE APPT.   PATIENT WILL BE CALLING OFFICE TO MAKE ANOTHER APPT. SPOKE WITH JASMINE AT THE OFFICE. LELA STURDIVANT NTII

## 2010-11-07 NOTE — Assessment & Plan Note (Signed)
Summary: ACUTE-COUGHING/CHEST CONGESTION/([facility]AL[facility])/CFB   Vital Signs:  Patient profile:   55 year old female Height:      65 inches (165.10 cm) Weight:      186.4 pounds (84.73 kg) BMI:     31.13 Temp:     97.9 degrees F (36.61 degrees C) oral Pulse rate:   100 / minute BP sitting:   156 / 84  (left arm)  Vitals Entered By: Stanton Kidney Ditzler RN (November 16, 2009 2:20 PM) Is Patient Diabetic? Yes Did you bring your meter with you today? No Pain Assessment Patient in pain? yes     Location: chest Intensity: 10 Type: burning Onset of pain  past week Nutritional Status BMI of > 30 = obese Nutritional Status Detail appetite good CBG Result 179  Have you ever been in a relationship where you felt threatened, hurt or afraid?denies   Does patient need assistance? Functional Status Self care Ambulation Normal Comments Past week has had white productive cough and head congestion.   Primary Care Provider:  Blondell Reveal MD   History of Present Illness: 55 y/o female with pmh as described on the EMR. Who comes to the clinic complaining of URI symptoms for about 3-4 weeks and slowly getting worse. Patient described general malaise, white sputum productive cough, chills, nasal congestion, SOB, HA's and PND.   Patient reports having increased haertburn sensation; but endorses good compliance with her medications and also with lifestyle modification changes.  No palpitations, fever, nausea, vomiting, diarrhea, constipation, melena, hematemesis or hematochezia.  A1C 6.2 and BP today mildly elevated in the 150's range.  Depression History:      The patient denies a depressed mood most of the day and a diminished interest in her usual daily activities.        The patient denies that she feels like life is not worth living, denies that she wishes that she were dead, and denies that she has thought about ending her life.         Preventive Screening-Counseling &  Management  Alcohol-Tobacco     Alcohol drinks/day: 0     Smoking Status: current     Smoking Cessation Counseling: yes     Packs/Day: 0.25     Year Started: at the age of 72     Passive Smoke Exposure: yes  Caffeine-Diet-Exercise     Does Patient Exercise: no     Type of exercise: walkiing     Times/week: 2  Problems Prior to Update: 1)  Upper Respiratory Infection  (ICD-465.9) 2)  Sebaceous Cyst, Breast  (ICD-610.8) 3)  Preventive Health Care  (ICD-V70.0) 4)  Rlq Pain  (ICD-789.03) 5)  Cigarette Smoker  (ICD-305.1) 6)  Ear Pain, Right  (ICD-388.70) 7)  Insomnia  (ICD-780.52) 8)  Unspecified Sinusitis  (ICD-473.9) 9)  Unspecified Vaginitis and Vulvovaginitis  (ICD-616.10) 10)  Bacterial Vaginitis  (ICD-616.10) 11)  Bell's Palsy, Left  (ICD-351.0) 12)  Allergic Rhinitis  (ICD-477.9) 13)  Adenomatous Colonic Polyp  (ICD-211.3) 14)  Diabetes Mellitus, Type II  (ICD-250.00) 15)  Hypertension  (ICD-401.9) 16)  Hypertensive Retinopathy  (ICD-362.11) 17)  Dyslipidemia  (ICD-272.4) 18)  Pruritus  (ICD-698.9) 19)  Diabetic Peripheral Neuropathy  (ICD-250.60) 20)  Health Maintenance Exam  (ICD-V70.0) 21)  Smoker  (ICD-305.1) 22)  Gerd  (ICD-530.81) 23)  Hx of Leg Cramps  (ICD-729.82) 24)  Fibroids, Uterus  (ICD-218.9) 25)  Perimenopausal Status  (ICD-627.2) 26)  External Hemorrhoids  (ICD-455.3)  Medications Prior to Update: 1)  Hyzaar 100-25 Mg Tabs (Losartan Potassium-Hctz) .... Take 1 Tablet By Mouth Once A Day 2)  Aspirin 81 Mg Tbec (Aspirin) .... Take 1 Tablet By Mouth Once A Day 3)  Janumet 50-500 Mg Tabs (Sitagliptin-Metformin Hcl) .... Take 1 Tablet By Mouth Two Times A Day 4)  Tylenol 8 Hour 650 Mg  Tbcr (Acetaminophen) .... Take 1 Tablet By Mouth Three Times A Day 5)  Freestyle Lite Test   Strp (Glucose Blood) .... Use To Test Blood Glucsoe Once Time A Day 6)  Lancets   Misc (Lancets) .... Use To Test Blood Glucose One Time A Day 7)  Freestyle Control Solution    Liqd (Blood Glucose Calibration) .... Use To Calibrate Meter and Assure Quality Readings. 8)  Catapres 0.1 Mg Tabs (Clonidine Hcl) .... Take 2 Tablets By Mouth At Bedtime 9)  Prodigy No Coding Blood Gluc  Strp (Glucose Blood) .... Use To Check Your Sugar 1-2 Times A Day 10)  Prodigy Blood Glucose Monitor  Devi (Blood Glucose Monitoring Suppl) .... Use To Check Your Blood Sugar 11)  Premarin 0.625 Mg/gm Crea (Estrogens, Conjugated) .... Apply Once Daily For 2 Weeks. Twice Weekly Thereafter 12)  Claritin 10 Mg Tabs (Loratadine) .... Take 1 Tablet By Mouth Once A Day 13)  Cortisporin 3.5-10000-1 Soln (Neomycin-Polymyxin-Hc) .... 4 Drops Three Times A Day Into Affected Ear. 14)  Flunisolide 0.025 % Soln (Flunisolide) .... 2 Sprays in Each Nostril Twice Daily 15)  Pravachol 40 Mg Tabs (Pravastatin Sodium) .... Take 1 Tablet By Mouth Once A Day At Bedtime  Current Medications (verified): 1)  Hyzaar 100-25 Mg Tabs (Losartan Potassium-Hctz) .... Take 1 Tablet By Mouth Once A Day 2)  Aspirin 81 Mg Tbec (Aspirin) .... Take 1 Tablet By Mouth Once A Day 3)  Janumet 50-500 Mg Tabs (Sitagliptin-Metformin Hcl) .... Take 1 Tablet By Mouth Two Times A Day 4)  Tylenol 8 Hour 650 Mg  Tbcr (Acetaminophen) .... Take 1 Tablet By Mouth Three Times A Day 5)  Freestyle Lite Test   Strp (Glucose Blood) .... Use To Test Blood Glucsoe Once Time A Day 6)  Lancets   Misc (Lancets) .... Use To Test Blood Glucose One Time A Day 7)  Freestyle Control Solution   Liqd (Blood Glucose Calibration) .... Use To Calibrate Meter and Assure Quality Readings. 8)  Catapres 0.1 Mg Tabs (Clonidine Hcl) .... Take 2 Tablets By Mouth At Bedtime 9)  Prodigy No Coding Blood Gluc  Strp (Glucose Blood) .... Use To Check Your Sugar 1-2 Times A Day 10)  Prodigy Blood Glucose Monitor  Devi (Blood Glucose Monitoring Suppl) .... Use To Check Your Blood Sugar 11)  Premarin 0.625 Mg/gm Crea (Estrogens, Conjugated) .... Apply Once Daily For 2 Weeks. Twice  Weekly Thereafter 12)  Claritin 10 Mg Tabs (Loratadine) .... Take 1 Tablet By Mouth Once A Day 13)  Cortisporin 3.5-10000-1 Soln (Neomycin-Polymyxin-Hc) .... 4 Drops Three Times A Day Into Affected Ear. 14)  Flunisolide 0.025 % Soln (Flunisolide) .... 2 Sprays in Each Nostril Twice Daily 15)  Pravachol 40 Mg Tabs (Pravastatin Sodium) .... Take 1 Tablet By Mouth Once A Day At Bedtime  Allergies: 1)  ! Codeine 2)  ! Vicodin 3)  ! Doxycycline Hyclate (Doxycycline Hyclate)  Past History:  Past Medical History: Last updated: 02/28/2009 L sided Bell's palsy 01/2009 Diabetes mellitus, type II Hypertension Headache Tobacco use Hyperlipidemia Allergic rhinitis Hx leg cramps on Lipitor GERD Uterine fibroids Hx chest pain : exercise stress test negative 06-07  Perimenopausal ? Insomnia External hemorrhoids  Past Surgical History: Last updated: 05/11/2009 none  Family History: Last updated: 09/02/08 M: died with bad cough 1969 @ 57yo, not sure about health history S: died with bad cough 1996 @ 57yo, of pneumonia  Social History: Last updated: 11/12/2006 Current Smoker  Risk Factors: Alcohol Use: 0 (11/16/2009) Exercise: no (11/16/2009)  Risk Factors: Smoking Status: current (11/16/2009) Packs/Day: 0.25 (11/16/2009) Passive Smoke Exposure: yes (11/16/2009)  Review of Systems       As per HPI.  Physical Exam  General:  alert, well-developed, and well-nourished.   Head:  normocephalic and atraumatic.   Nose:  Tender to palpation frontal and maxillary sinuses area, nasal congestion and also erythema on the back of her throat (making PND, most likely cause for this). Lungs:  normal respiratory effort, no intercostal retractions, no accessory muscle use, and good air movement; positive diffuse ronchi. Heart:  normal rate and regular rhythm.   Abdomen:  soft, non-tender, normal bowel sounds, and no distention.   Msk:  No deformity or scoliosis noted of thoracic or lumbar  spine.   Extremities:  No clubbing, cyanosis, edema, or deformity noted with normal full range of motion of all joints.   Neurologic:  alert & oriented X3, strength normal in all extremities, and gait normal.     Impression & Recommendations:  Problem # 1:  UPPER RESPIRATORY INFECTION (ICD-465.9) Patient with symptoms worsening for bacterial upper respiratory infection at this point. Will give treatmnet with avelox for 10 days; will advised to keep herself hydrated and to use tylenol multisymptoms for fever and comfort. Patient also received claritin and fluticasone spray to help with rinorrhea and allergic rhinitis. Patient instructed to use mucinex OTC for cough relief.  Her updated medication list for this problem includes:    Aspirin 81 Mg Tbec (Aspirin) .Marland Kitchen... Take 1 tablet by mouth once a day    Tylenol 8 Hour 650 Mg Tbcr (Acetaminophen) .Marland Kitchen... Take 1 tablet by mouth three times a day    Claritin 10 Mg Tabs (Loratadine) .Marland Kitchen... Take 1 tablet by mouth once a day  Problem # 2:  ALLERGIC RHINITIS (ICD-477.9) Will start fluticasone and daily claritin to help with her symptoms. Patient advised to avoid second hand smoking and smke in general.  The following medications were removed from the medication list:    Flunisolide 0.025 % Soln (Flunisolide) .Marland Kitchen... 2 sprays in each nostril twice daily Her updated medication list for this problem includes:    Claritin 10 Mg Tabs (Loratadine) .Marland Kitchen... Take 1 tablet by mouth once a day    Fluticasone Propionate 50 Mcg/act Susp (Fluticasone propionate) .Marland Kitchen... 2 spray inside each nostril daily.  Problem # 3:  HYPERTENSION (ICD-401.9) Mildly elevated today; since patient is suffering an acute disease that could contribute with elevated, will hold medication changes and adjustment at this time. She was advised to follow a low sodium diet (less than 2 grams daily) and also to be compliant with her medications, will check renal function and electrolytes. If patient BP  continue to be elevaed during next visit she will need amlodipine added to her regimen.  Her updated medication list for this problem includes:    Hyzaar 100-25 Mg Tabs (Losartan potassium-hctz) .Marland Kitchen... Take 1 tablet by mouth once a day    Catapres 0.1 Mg Tabs (Clonidine hcl) .Marland Kitchen... Take 2 tablets by mouth at bedtime  Problem # 4:  DYSLIPIDEMIA (ICD-272.4) Patient LDL 129 today. Will encourage her to follow a low fat  diet and will continue with same dose of pravachol for now. LFT's were WNL.  Her updated medication list for this problem includes:    Pravachol 40 Mg Tabs (Pravastatin sodium) .Marland Kitchen... Take 1 tablet by mouth once a day at bedtime  Orders: T-Lipid Profile [id number])  Labs Reviewed: SGOT: 14 (01/05/2009)   SGPT: 16 (01/05/2009)   HDL:55 (06/20/2009), 44 (01/05/2009)  LDL:140 (06/20/2009), 94 (09/81/1914)  Chol:219 (06/20/2009), 155 (01/05/2009)  Trig:118 (06/20/2009), 83 (01/05/2009)  Problem # 5:  DIABETES MELLITUS, TYPE II (ICD-250.00) A1C 6.2; DM is well controlled and at goal. Will continue current regimen and will encourage patient to continue medication compliance and low carb diet.  Her updated medication list for this problem includes:    Hyzaar 100-25 Mg Tabs (Losartan potassium-hctz) .Marland Kitchen... Take 1 tablet by mouth once a day    Aspirin 81 Mg Tbec (Aspirin) .Marland Kitchen... Take 1 tablet by mouth once a day    Janumet 50-500 Mg Tabs (Sitagliptin-metformin hcl) .Marland Kitchen... Take 1 tablet by mouth two times a day  Orders: T- Capillary Blood Glucose ([id number]) T-Hgb A1C (in-house) (62130QM)  Labs Reviewed: Creat: 0.74 (01/05/2009)     Last Eye Exam: No diabetic retinopathy.    (02/08/2009) Reviewed HgBA1c results: 6.2 (11/16/2009)  6.5 (06/20/2009)  Complete Medication List: 1)  Hyzaar 100-25 Mg Tabs (Losartan potassium-hctz) .... Take 1 tablet by mouth once a day 2)  Aspirin 81 Mg Tbec (Aspirin) .... Take 1 tablet by mouth once a day 3)  Janumet 50-500 Mg Tabs  (Sitagliptin-metformin hcl) .... Take 1 tablet by mouth two times a day 4)  Tylenol 8 Hour 650 Mg Tbcr (Acetaminophen) .... Take 1 tablet by mouth three times a day 5)  Freestyle Lite Test Strp (Glucose blood) .... Use to test blood glucsoe once time a day 6)  Lancets Misc (Lancets) .... Use to test blood glucose one time a day 7)  Freestyle Control Solution Liqd (Blood glucose calibration) .... Use to calibrate meter and assure quality readings. 8)  Catapres 0.1 Mg Tabs (Clonidine hcl) .... Take 2 tablets by mouth at bedtime 9)  Prodigy No Coding Blood Gluc Strp (Glucose blood) .... Use to check your sugar 1-2 times a day 10)  Prodigy Blood Glucose Monitor Devi (Blood glucose monitoring suppl) .... Use to check your blood sugar 11)  Premarin 0.625 Mg/gm Crea (Estrogens, conjugated) .... Apply once daily for 2 weeks. twice weekly thereafter 12)  Claritin 10 Mg Tabs (Loratadine) .... Take 1 tablet by mouth once a day 13)  Cortisporin 3.5-10000-1 Soln (Neomycin-polymyxin-hc) .... 4 drops three times a day into affected ear. 14)  Pravachol 40 Mg Tabs (Pravastatin sodium) .... Take 1 tablet by mouth once a day at bedtime 15)  Avelox 400 Mg Tabs (Moxifloxacin hcl) .... Take 1 tablet by mouth once a day 16)  Fluticasone Propionate 50 Mcg/act Susp (Fluticasone propionate) .... 2 spray inside each nostril daily. 17)  Nexium 40 Mg Cpdr (Esomeprazole magnesium) .... Take 1 tablet by mouth once a day  Other Orders: T-Comprehensive Metabolic Panel ([id number])  Patient Instructions: 1)  Get plenty of rest, drink lots of clear liquids, and use Tylenol multisymptoms for fever and comfort. Return in 7-10 days if you're not better:sooner if you're feeling worse. 2)  Please schedule a follow-up appointment in 2 months. 3)  Take your medications as prescribed. 4)  Follow a low sodium diet (less than 2 grams daily). 5)  You will be called with any abnormalities in the tests scheduled or  performed today.  If  you don't hear from [place] within a week from when the test was performed, you can assume that your test was normal. 6)  Use mucinex for cough and expectoration (twice a day). Prescriptions: NEXIUM 40 MG CPDR (ESOMEPRAZOLE MAGNESIUM) Take 1 tablet by mouth once a day  #31 x 5   Entered and Authorized by:   Vassie Loll MD   Signed by:   Vassie Loll MD on 11/16/2009   Method used:   Electronically to        [facility] [id number] (retail)       [address]       [place], [place]  [address]       Ph: [phone]       Fax: [phone]   RxID:   [id number] PRAVACHOL 40 MG TABS (PRAVASTATIN SODIUM) Take 1 tablet by mouth once a day at bedtime  #30 x 2   Entered and Authorized by:   Vassie Loll MD   Signed by:   Vassie Loll MD on 11/16/2009   Method used:   Electronically to        [facility] [address] (retail)       [address]       [place], [place]  [address]       Ph: [phone]       Fax: [phone]   RxID:   [id number] CLARITIN 10 MG TABS (LORATADINE) Take 1 tablet by mouth once a day  #30 x 3   Entered and Authorized by:   Vassie Loll MD   Signed by:   Vassie Loll MD on 11/16/2009   Method used:   Electronically to        [facility] [address] (retail)       [address] [place], [place]  [address]       Ph: [phone]       Fax: [phone]   RxID:   [id number] FLUTICASONE PROPIONATE 50 MCG/ACT SUSP (FLUTICASONE PROPIONATE) 2 spray inside each nostril daily.  #1 x 3   Entered and Authorized by:   Vassie Loll MD   Signed by:   Vassie Loll MD on 11/16/2009   Method used:   Electronically to        [facility] [address] (retail)       [address] [place], [place]  [address]       Ph: [phone]       Fax: [phone]   RxID:   [id number] AVELOX 400 MG TABS (MOXIFLOXACIN HCL) Take 1 tablet by mouth once a day  #10 x  0   Entered and Authorized by:   Vassie Loll MD   Signed by:   Vassie Loll MD on 11/16/2009   Method used:   Electronically to        [facility] [address] (retail)       [address]       [place], [place]  [address]       Ph: [phone]       Fax: [phone]   RxID:   [id number]  Process Orders Check Orders Results:     [facility]: ABN not required for this insurance Tests Sent for requisitioning (November 23, 2009 6:31 AM):     11/16/2009: [facility] -- T-Lipid Profile [id number] (signed)     11/16/2009: [facility] -- T-Comprehensive Metabolic Panel [id number] (signed)    Prevention & Chronic Care Immunizations   Influenza vaccine: Not documented   Influenza vaccine deferral: Refused  (05/11/2009)    Tetanus booster: 05/18/2009: Td   Td booster deferral: Deferred  (11/16/2009)    Pneumococcal vaccine: Not documented   Pneumococcal vaccine deferral: Deferred  (11/16/2009)  Colorectal Screening   Hemoccult: Negative  (12/25/2005)    Colonoscopy: One 4 mm polyp in the proximal ascending colon, resected and retrieved.  Two 3-5 mm polyps in the proximal ascending colon, resected and retrieved. Pathology results pending. Exam done by Dr.Vincent Schooler.  (03/22/2008)   Colonoscopy action/deferral: Repeat colonoscopy in 1-3 months.    (01/06/2008)   Colonoscopy due: 03/23/2011  Other Screening   Pap smear: NEGATIVE FOR INTRAEPITHELIAL LESIONS OR MALIGNANCY.  (02/28/2009)   Pap smear due: 02/29/2012    Mammogram: No specific mammographic evidence of malignancy.  Assessment: BIRADS 1.   (03/04/2008)   Mammogram action/deferral: Ordered  (05/11/2009)   Mammogram due: 03/2009   Smoking status: current  (11/16/2009)   Smoking cessation counseling: yes  (11/16/2009)  Diabetes Mellitus   HgbA1C: 6.2  (11/16/2009)    Eye exam: No diabetic retinopathy.     (02/08/2009)    Eye exam due: 02/08/2010    Foot exam: yes  (08/31/2008)   High risk foot: No  (06/28/2008)   Foot care education: Done  (05/11/2009)    Urine microalbumin/creatinine ratio: 6.8  (06/28/2008)   Urine microalbumin/cr due: 06/28/2009    Diabetes flowsheet reviewed?: Yes   Progress toward A1C goal: At goal  Lipids   Total Cholesterol: 219  (06/20/2009)   Lipid panel action/deferral: Lipid Panel ordered   LDL: 140  (06/20/2009)   LDL Direct: Not documented   HDL: 55  (06/20/2009)   Triglycerides: 118  (06/20/2009)   Lipid panel due: 01/05/2010    SGOT (AST): 14  (01/05/2009)   BMP action: Ordered   SGPT (ALT): 16  (01/05/2009) CMP ordered    Alkaline phosphatase: 62  (01/05/2009)   Total bilirubin: 0.3  (01/05/2009)    Lipid flowsheet reviewed?: Yes   Progress toward LDL goal: Deteriorated   Lipid comments: Patient has not been using her statins, due to headache when she use it.  Hypertension   Last Blood Pressure: 156 / 84  (11/16/2009)   Serum creatinine: 0.74  (01/05/2009)   BMP action: Ordered   Serum potassium 4.1  (01/05/2009) CMP ordered    Basic metabolic panel due: 01/05/2010    Hypertension flowsheet reviewed?: Yes   Progress toward BP goal: Deteriorated  Self-Management Support :   Personal Goals (by the next clinic visit) :     Personal A1C goal: 6  (11/16/2009)     Personal blood pressure goal: 130/80  (05/11/2009)     Personal LDL goal: 70  (11/16/2009)    Patient will work on the following items until the next clinic visit to reach self-care goals:     Medications and monitoring: take my medicines every day, check my blood sugar, check my blood pressure, examine my feet every day  (11/16/2009)     Eating: drink diet soda or water instead of juice or soda, eat more vegetables,  use fresh or frozen vegetables, eat baked foods instead of fried foods, eat fruit for snacks and desserts, limit or avoid alcohol  (11/16/2009)     Activity: take a 30 minute walk  every day, take the stairs instead of the elevator, park at the far end of the parking lot  (11/16/2009)     Home glucose monitoring frequency: 1 time daily  (11/16/2009)    Diabetes self-management support: Written self-care plan  (11/16/2009)   Diabetes care plan printed   Last diabetes self-management training by diabetes educator: 01/05/2009    Hypertension self-management support: Written self-care plan  (11/16/2009)   Hypertension self-care plan printed.    Lipid self-management support: Written self-care plan  (11/16/2009)   Lipid self-care plan printed.  Laboratory Results   Blood Tests   Date/Time Received: November 16, 2009 2:33 PM Date/Time Reported: Alric Quan  November 16, 2009 2:33 PM  HGBA1C: 6.2%   (Normal Range: Non-Diabetic - 3-6%   Control Diabetic - 6-8%) CBG Random:: 179mg /dL

## 2010-11-07 NOTE — Assessment & Plan Note (Signed)
Summary: EST-CK/FU/MEDS/CFB   Vital Signs:  Patient Profile:   55 Years Old Female Height:     67 inches (170.18 cm) Weight:      192.6 pounds (87.55 kg) Temp:     96.8 degrees F (36 degrees C) oral Pulse rate:   75 / minute BP sitting:   148 / 93  (right arm) Cuff size:   regular  Pt. in pain?   yes    Location:   left leg    Intensity:   9    Type:       burning/ aches  Vitals Entered By: Theotis Barrio (April 22, 2007 9:50 AM)              Is Patient Diabetic? Yes  Nutritional Status normal CBG Result 199  Does patient need assistance? Functional Status Self care Ambulation Normal   Chief Complaint:  left leg pain with burning and aches / routine check up .Bridget Owens  History of Present Illness: This is a 55 year old woman with a past medical history of Diabetes mellitus, type II Hypertension Headache Tobacco use Hyperlipidemia Allergic rhinitis Hx leg cramps on Lipitor GERD Uterine fibroids Hx chest pain : exercise stress test negative 06-07 Perimenopausal Insomnia External hemorrhoids   who comes in with a complaint of left leg pain.  The pain is located in back of her left thigh and goes all the way down to her lower leg (front mostly) and foot.  It feel like a deep ache and "muscle spasm" in her thigh and an electrical shock in her leg and foot.  She does not recall any trauma.  She denies muscle weakness but has occasionnal tingling.   Current Allergies (reviewed today): ! PCN ! CODEINE ! VICODIN  Past Medical History:    Diabetes mellitus, type II    Hypertension    Headache    Tobacco use    Hyperlipidemia    Allergic rhinitis    Hx leg cramps on Lipitor    GERD    Uterine fibroids    Hx chest pain : exercise stress test negative 06-07    Perimenopausal    Insomnia    External hemorrhoids    Menopause    Atrophic vaginitis    Risk Factors:  Tobacco use:  current    Year started:  at the age of 37    Cigarettes:  Yes -- 1/2 pack(s) per  day Alcohol use:  no Exercise:  yes    Times per week:  7    Type:  walkiing Seatbelt use:  100 %  Mammogram History:    Date of Last Mammogram:  01/02/2006  PAP Smear History:    Date of Last PAP Smear:  01/07/2004   Review of Systems  General      Denies chills, fatigue, fever, and loss of appetite.      Complains of weight gain.  CV      Denies chest pain or discomfort, fatigue, leg cramps with exertion, palpitations, shortness of breath with exertion, and swelling of feet.  Resp      Denies shortness of breath.  GI      Denies abdominal pain, bloody stools, change in bowel habits, indigestion, and loss of appetite.  GU      Complains of decreased libido.      Denies dysuria, hematuria, and urinary frequency.      Thinks her decreased libido is due to dry vaginal mucosa.  MS  See HPI   Physical Exam  General:     alert, well-developed, and well-nourished.   Head:     normocephalic and atraumatic.   Eyes:     vision grossly intact.   Lungs:     normal respiratory effort and normal breath sounds.   Heart:     normal rate, regular rhythm, no murmur, no gallop, and no rub.   Abdomen:     soft, non-tender, and normal bowel sounds.  Obese Msk:     Left leg : no pain on palpation of lower leg or hip, but has significant muscular tenderness in back of thigh with muscle spasm.  No pain in palpation of more proximal hip muscles bilaterally.  Normal strenght.  Normal straight leg raise.  No back pain.   Neurologic:     sensation intact to light touch and DTRs symmetrical and normal.      Impression & Recommendations:  Problem # 1:  LEG PAIN, LEFT (ICD-729.5) I think she has muscle spasm of an unknown cause in her left thigh that is causing her irritation of her sciatica as well.  She had very stiff quadricep and hamstring on the left.  I gave her some stretching exercises as well as naproxen and flexeril as needed.  I do not believe this is referred pain from  her back as she has absolutely no symptoms in her back and her exam is normal.  If her symptoms do not get better despite my treatment, then I would reconsider imaging her back.  Problem # 2:  VAGINITIS, ATROPHIC, SYMPTOMATIC (ICD-627.3) Premarin cream for symptom relief.  The patient was very happy with this. Her updated medication list for this problem includes:    Premarin 0.625 Mg/gm Crea (Estrogens, conjugated) .Bridget Owens... Apply 0.5 mg intravaginally daily   Problem # 3:  HYPERTENSION (ICD-401.9) Had not taken her medication today and forgot a few doses recently.  No changes for now Her updated medication list for this problem includes:    Hydrochlorothiazide 25 Mg Tabs (Hydrochlorothiazide) .Bridget Owens... Take 1 tablet by mouth once a day    Cozaar 100 Mg Tabs (Losartan potassium) .Bridget Owens... Take 1 tablet by mouth once a day  BP today: 148/93 Prior BP: 158/94 (03/18/2007)  Labs Reviewed: Creat: 0.73 (03/18/2007) Chol: 222 (12/25/2005)   HDL: 56 (12/25/2005)   LDL: 150 (12/25/2005)   TG: 78 (12/25/2005)   Problem # 4:  DYSLIPIDEMIA (ICD-272.4) Will recheck FLP at her next appointment. Labs Reviewed: Chol: 222 (12/25/2005)   HDL: 56 (12/25/2005)   LDL: 150 (12/25/2005)   TG: 78 (12/25/2005)   Problem # 5:  DIABETES MELLITUS, TYPE II (ICD-250.00) Will see her back in 4 months to recheck an HgbA1C.  Continue metformin only until then.  Recommendations made for weight loss. Her updated medication list for this problem includes:    Cozaar 100 Mg Tabs (Losartan potassium) .Bridget Owens... Take 1 tablet by mouth once a day    Aspirin 81 Mg Tbec (Aspirin) .Bridget Owens... Take 1 tablet by mouth once a day    Metformin Hcl 500 Mg Tabs (Metformin hcl) .Bridget Owens... Take 1 tablet by mouth two times a day  Orders: Capillary Blood Glucose (16109) Fingerstick (60454) Capillary Blood Glucose (09811) Fingerstick (91478)  Labs Reviewed: HgBA1c: 7.1 (02/12/2007)   Creat: 0.73 (03/18/2007)      Medications Added to Medication List  This Visit: 1)  Cyclobenzaprine Hcl 10 Mg Tabs (Cyclobenzaprine hcl) .... Take 1 tablet by mouth every 8 hours as needed for  muscle spasm 2)  Premarin 0.625 Mg/gm Crea (Estrogens, conjugated) .... Apply 0.5 mg intravaginally daily   Patient Instructions: 1)  Please schedule a follow-up appointment in 4 months. 2)  Take the antiinflammatory and muscle relaxant as needed for your leg pain. 3)  Don't forget to stretch both of your legs as I have showed you, at least 3 time a day. 4)  Use the premarin intravaginal cream every day for one month, then you can try decreasing the frequency to 2-3 times a week, as you feel you need it.   5)  You need to lose weight. Consider a lower calorie diet and regular exercise.     Prescriptions: PREMARIN 0.625 MG/GM  CREA (ESTROGENS, CONJUGATED) Apply 0.5 mg intravaginally daily  #42.5g tube x prn   Entered and Authorized by:   Dellia Beckwith MD   Signed by:   Dellia Beckwith MD on 04/22/2007   Method used:   Electronically sent to ...       [facility] [address]       [address] [place], [place]  [address]       Ph: [phone]       Fax: [phone]   RxID:   [id number] CYCLOBENZAPRINE HCL 10 MG  TABS (CYCLOBENZAPRINE HCL) Take 1 tablet by mouth every 8 hours as needed for muscle spasm  #60 x 1   Entered and Authorized by:   Dellia Beckwith MD   Signed by:   Dellia Beckwith MD on 04/22/2007   Method used:   Electronically sent to ...       [facility] [address]*       [address] [place], [place]  [address]       Ph: [phone]       Fax: [phone]   RxID:   [id number] NAPROSYN 375 MG TABS (NAPROXEN) Take 1 tablet by mouth two times a day  #60 x 0   Entered and Authorized by:   Dellia Beckwith MD   Signed by:   Dellia Beckwith MD on 04/22/2007   Method used:   Electronically sent to ...       [facility] [address]       [address] [place], [place]  [address]       Ph: [phone]       Fax: [phone]   RxID:   [id number]

## 2010-11-07 NOTE — Assessment & Plan Note (Signed)
Summary: ear pain,swelling to toe/gg   Vital Signs:  Patient Profile:   55 Years Old Female Height:     67 inches (170.18 cm) Weight:      187.3 pounds (85.14 kg) BMI:     29.44 Temp:     97.5 degrees F (36.39 degrees C) oral Pulse rate:   80 / minute BP sitting:   146 / 87  (left arm)  Pt. in pain?   yes    Location:   right ear and legs    Intensity:   6  Vitals Entered By: Stanton Kidney Ditzler RN (Mar 03, 2008 9:23 AM)              Is Patient Diabetic? Yes  Nutritional Status BMI of 25 - 29 = overweight Nutritional Status Detail same CBG Result 158  Have you ever been in a relationship where you felt threatened, hurt or afraid?denies   Does patient need assistance? Functional Status Self care Ambulation Normal     PCP:  Dellia Beckwith MD  Chief Complaint:  Past 2 weeks - right ear ache. Past 3 weeks pain in both legs to low abd area. Right leg numb and ft swollen.Marland Kitchen  History of Present Illness: This is a 55   y/o woman with PMH of Diabetes mellitus, type II Hypertension Headache Tobacco use Hyperlipidemia Allergic rhinitis Hx leg cramps on Lipitor GERD Uterine fibroids Hx chest pain : exercise stress test negative 06-07 Perimenopausal Insomnia External hemorrhoids Menopause Atrophic vaginitis presenting for ear pain and a painful toe. Of note, she has a h/o acute, purulent otitis media and a h/o chronic bilateral ear pain. Per Dr. Iva Lento note, she has been placing bobby pins in her ears as they itched. At a previous visit, Dr. Lorel Monaco advised her not to put foreign bodies into her ears. She c/o right ear pain, sharp, 6/10, sometimes assoc. w/ HA, started 2 weeks ago. No discharge. Has itching. She can still hear through the ear.No fever/chills. She has a pain and swelling in her left toe. She does not remember when exactly it started but remembers feeling a sharp pain in her toe 5 days ago. That went away and restarted yest. Does not hurt her toe. No new shoes.  She also has numbness in her right leg.    Prior Medications Reviewed Using: Patient Recall  Updated Prior Medication List: HYDROCHLOROTHIAZIDE 25 MG TABS (HYDROCHLOROTHIAZIDE) Take 1 tablet by mouth once a day COZAAR 100 MG TABS (LOSARTAN POTASSIUM) Take 1 tablet by mouth once a day ASPIRIN 81 MG TBEC (ASPIRIN) Take 1 tablet by mouth once a day METFORMIN HCL 500 MG  TABS (METFORMIN HCL) Take 1 tablet by mouth two times a day TYLENOL 8 HOUR 650 MG  TBCR (ACETAMINOPHEN) Take 1 tablet by mouth three times a day PROTONIX 40 MG  PACK (PANTOPRAZOLE SODIUM) Take one tablet by mouth daily AMLODIPINE BESYLATE 5 MG  TABS (AMLODIPINE BESYLATE) Take 1 tablet by mouth once a day AMITRIPTYLINE HCL 25 MG  TABS (AMITRIPTYLINE HCL) Take 1 tablet by mouth at bedtime FREESTYLE LITE TEST   STRP (GLUCOSE BLOOD) use to test blood glucsoe once time a day [BMN] LANCETS   MISC (LANCETS) use to test blood glucose one time a day FREESTYLE CONTROL SOLUTION   LIQD (BLOOD GLUCOSE CALIBRATION) use to calibrate meter and assure quality readings. [BMN]  Current Allergies (reviewed today): ! CODEINE ! VICODIN ! DOXYCYCLINE HYCLATE (DOXYCYCLINE HYCLATE)    Risk Factors:  Tobacco use:  current  Year started:  at the age of 21    Cigarettes:  Yes -- 1/2 pack(s) per day Passive smoke exposure:  yes Alcohol use:  no Exercise:  yes    Times per week:  2    Type:  walkiing Seatbelt use:  100 %  Colonoscopy History:    Date of Last Colonoscopy:  01/06/2008  Mammogram History:    Date of Last Mammogram:  01/02/2006  PAP Smear History:    Date of Last PAP Smear:  02/24/2008    Physical Exam  General:     alert and overweight-appearing.   Eyes:     vision grossly intact, pupils equal, pupils round, and pupils reactive to light.   Mouth:     pharynx pink and moist.   Lungs:     normal respiratory effort, no intercostal retractions, and no accessory muscle use.   Heart:     normal rate, regular  rhythm, and no murmur.   Msk:     left toe not swollen, not red, not painful to palpation, with full ROM. Long toe nail. Extremities:     no edema    Impression & Recommendations:  Problem # 1:  CERUMEN IMPACTION, RIGHT (ICD-380.4) Assessment: Unchanged Pt did not want to have her ears irrigated today and she agreed to use Debrox 3 gtt tid. Orders: Est. Patient Level III (04540)   Problem # 2:  DIABETIC PERIPHERAL NEUROPATHY (ICD-250.60) She has numbness in her legs R>L. She was not taking her Amytriptilline, so I refilled it and advised to give it a try.  The following medications were removed from the medication list:    Glipizide 5 Mg Tb24 (Glipizide) .Marland Kitchen... Take 1 tablet by mouth once a day  Her updated medication list for this problem includes:    Cozaar 100 Mg Tabs (Losartan potassium) .Marland Kitchen... Take 1 tablet by mouth once a day    Aspirin 81 Mg Tbec (Aspirin) .Marland Kitchen... Take 1 tablet by mouth once a day    Metformin Hcl 500 Mg Tabs (Metformin hcl) .Marland Kitchen... Take 1 tablet by mouth two times a day   Problem # 3:  HYPERTENSION (ICD-401.9) Upon questioning, pt not taking the Norvasc, I gave her another refill and encoyuraged her to take it. However, BP much better than last time.  Her updated medication list for this problem includes:    Hydrochlorothiazide 25 Mg Tabs (Hydrochlorothiazide) .Marland Kitchen... Take 1 tablet by mouth once a day    Cozaar 100 Mg Tabs (Losartan potassium) .Marland Kitchen... Take 1 tablet by mouth once a day    Amlodipine Besylate 5 Mg Tabs (Amlodipine besylate) .Marland Kitchen... Take 1 tablet by mouth once a day  BP today: 146/87 Prior BP: 176/90 (02/24/2008)  Labs Reviewed: Creat: 0.73 (10/29/2007) Chol: 222 (12/25/2005)   HDL: 56 (12/25/2005)   LDL: 150 (12/25/2005)   TG: 78 (12/25/2005)  Orders: Est. Patient Level III (98119)   Problem # 4:  DIABETES MELLITUS, TYPE II (ICD-250.00) Pt says she is not on Glipizide as she has nausea when taking it. I took it Chirop[facility].  The following  medications were removed from the medication list:    Glipizide 5 Mg Tb24 (Glipizide) .Marland Kitchen... Take 1 tablet by mouth once a day  Her updated medication list for this problem includes:    Cozaar 100 Mg Tabs (Losartan potassium) .Marland Kitchen... Take 1 tablet by mouth once a day    Aspirin 81 Mg Tbec (Aspirin) .Marland Kitchen... Take 1 tablet by mouth once a day  Metformin Hcl 500 Mg Tabs (Metformin hcl) .Marland Kitchen... Take 1 tablet by mouth two times a day  Orders: Capillary Blood Glucose (11914) Fingerstick (78295) Est. Patient Level III (62130)  Future Orders: T-Urine Microalbumin w/creat. ratio [id number] / [id number]) ... 03/05/2008   Problem # 5:  TOE PAIN (ICD-729.5) Toe looks normal, the only thing that I noticed was that pt. had a long toe nail and this might cause her toe to have an uncomfortable position in her shoe. I advised her to cut it and make sure she is wearing proper shoes.  Other Orders: Future Orders: T-Lipid Profile ([id number]) ... 03/05/2008   Patient Instructions: 1)  Please schedule a follow-up appointment in 3 months.\par 2)  Please use Debrox in right ear 3 drops 3 x a day.   Prescriptions: AMITRIPTYLINE HCL 25 MG  TABS (AMITRIPTYLINE HCL) Take 1 tablet by mouth at bedtime  #30 x 11   Entered and Authorized by:   Carlus Pavlov MD   Signed by:   Carlus Pavlov MD on 03/03/2008   Method used:   Electronically sent to ...       [facility] [address]       [address]       [place], [place]  [address]       Ph: [phone]       Fax: [phone]   RxID:   [id number] AMLODIPINE BESYLATE 5 MG  TABS (AMLODIPINE BESYLATE) Take 1 tablet by mouth once a day  #30 x 12   Entered and Authorized by:   Carlus Pavlov MD   Signed by:   Carlus Pavlov MD on 03/03/2008   Method used:   Electronically sent to ...       [facility] [address]       [address]       [place], [place]  [address]       Ph: [phone]       Fax:  [phone]   RxID:   [id number] PROTONIX 40 MG  PACK (PANTOPRAZOLE SODIUM) Take one tablet by mouth daily  #62 x 5   Entered and Authorized by:   Carlus Pavlov MD   Signed by:   Carlus Pavlov MD on 03/03/2008   Method used:   Electronically sent to ...       [facility] [address]       [address]       [place], [place]  [address]       Ph: [phone]       Fax: [phone]   RxID:   [id number]  ]

## 2010-11-07 NOTE — Consult Note (Signed)
Summary: Cardiology, Treadmill Test: Dr.B. Juanda Chance  Cardiology, Treadmill Test: Dr.B. Juanda Chance   Imported By: Florinda Marker 04/16/2007 14:23:19  _____________________________________________________________________  External Attachment:    Type:   Image     Comment:   External Document

## 2010-11-07 NOTE — Assessment & Plan Note (Signed)
Summary: TETANUS/ SB.  Nurse Visit   Allergies: 1)  ! Codeine 2)  ! Vicodin 3)  ! Doxycycline Hyclate (Doxycycline Hyclate)  Immunizations Administered:  Tetanus Vaccine:    Vaccine Type: Td    Site: right deltoid    Mfr: Sanofi Pasteur    Dose: 0.5 ml    Route: IM    Given by: Angelina Ok RN    Exp. Date: 12/10/2010    Lot #: [id number]    VIS given: 08/26/07 version given May 18, 2009.  Orders Added: 1)  Tetanus Toxoid w/Dx [45409] 2)  Admin 1st Vaccine [81191]

## 2010-11-07 NOTE — Progress Notes (Signed)
Summary: phone/gg  Phone Note Call from Patient   Caller: Patient Summary of Call: Pt called and stated she would not take her caduet any more. It makes her feel bad, her mouth feels heavy and it causes increase saliva.  THis is not new, it has been going on since medication was started  in 2009.  She stopped taking caduet 2 days ago and feels better. I made an appointment for this Thursday so this  can be reviewed   with the doctor and be changed to another med. Initial call taken by: Merrie Roof RN,  Feb 08, 2009 9:49 AM  Follow-up for Phone Call        Sunds like good plan for now Follow-up by: Acey Lav MD,  Feb 08, 2009 9:59 AM

## 2010-11-07 NOTE — Assessment & Plan Note (Signed)
Summary: ARM/SB.   Vital Signs:  Patient profile:   55 year old female Height:      65 inches (165.10 cm) Weight:      182.8 pounds (83.09 kg) Temp:     98.6 degrees F (37.00 degrees C) Pulse rate:   76 / minute BP sitting:   148 / 90  (left arm) Cuff size:   regular  Vitals Entered By: Cynda Familia Duncan Dull) (July 28, 2010 1:37 PM) CC: right arm pain and numbness CBG Result 183  Have you ever been in a relationship where you felt threatened, hurt or afraid?No   Does patient need assistance? Functional Status Self care Ambulation Normal    Primary Care Provider:  Blondell Reveal MD  CC:  right arm pain and numbness.  History of Present Illness: 55 yr old woman with pmhx as described below comes to the clinic complaining of arm pain and numbness since one month ago. Denies any trauma. Reports that she does sometimes she has neck pain on the same side. Patient reports that arm "feels funny". She has not been able to do her cleaning at home or sleep well at night due to pain.   Reports that sugars have been well controlled. She did not bring glucose meter.   Preventive Screening-Counseling & Management  Alcohol-Tobacco     Alcohol drinks/day: 0     Smoking Status: current     Smoking Cessation Counseling: yes     Packs/Day: 1/2 ppd     Year Started: at the age of 50     Passive Smoke Exposure: yes  Problems Prior to Update: 1)  Ear Pain, Right  (ICD-388.70) 2)  Headache  (ICD-784.0) 3)  Hot Flashes  (ICD-627.2) 4)  Osteoarthritis, Carpometacarpal Joint, Right Thumb  (ICD-715.94) 5)  Trigger Finger, Right Thumb  (ICD-727.03) 6)  Sebaceous Cyst, Breast  (ICD-610.8) 7)  Preventive Health Care  (ICD-V70.0) 8)  Cigarette Smoker  (ICD-305.1) 9)  Insomnia  (ICD-780.52) 10)  Bell's Palsy, Left  (ICD-351.0) 11)  Allergic Rhinitis  (ICD-477.9) 12)  Adenomatous Colonic Polyp  (ICD-211.3) 13)  Diabetes Mellitus, Type II  (ICD-250.00) 14)  Hypertension  (ICD-401.9) 15)   Hypertensive Retinopathy  (ICD-362.11) 16)  Dyslipidemia  (ICD-272.4) 17)  Pruritus  (ICD-698.9) 18)  Diabetic Peripheral Neuropathy  (ICD-250.60) 19)  Health Maintenance Exam  (ICD-V70.0) 20)  Hx of Leg Cramps  (ICD-729.82) 21)  Fibroids, Uterus  (ICD-218.9) 22)  Perimenopausal Status  (ICD-627.2) 23)  External Hemorrhoids  (ICD-455.3)  Medications Prior to Update: 1)  Hyzaar 100-25 Mg Tabs (Losartan Potassium-Hctz) .... Take 1 Tablet By Mouth Once A Day 2)  Aspirin 81 Mg Tbec (Aspirin) .... Take 1 Tablet By Mouth Once A Day 3)  Janumet 50-500 Mg Tabs (Sitagliptin-Metformin Hcl) .... Take 1 Tablet By Mouth Two Times A Day 4)  Freestyle Lite Test   Strp (Glucose Blood) .... Use To Test Blood Glucsoe Once Time A Day 5)  Lancets   Misc (Lancets) .... Use To Test Blood Glucose One Time A Day 6)  Freestyle Control Solution   Liqd (Blood Glucose Calibration) .... Use To Calibrate Meter and Assure Quality Readings. 7)  Prodigy No Coding Blood Gluc  Strp (Glucose Blood) .... Use To Check Your Sugar 1-2 Times A Day 8)  Prodigy Blood Glucose Monitor  Devi (Blood Glucose Monitoring Suppl) .... Use To Check Your Blood Sugar 9)  Cortisporin 3.5-10000-1 Soln (Neomycin-Polymyxin-Hc) .... 4 Drops Three Times A Day Into Affected Ear. 10)  Pravachol 40 Mg Tabs (Pravastatin Sodium) .... Take 1 Tablet By Mouth Once A Day At Bedtime 11)  Tylenol Extra Strength 500 Mg Tabs (Acetaminophen) .... Take 2 Tabs By Mouth Ever 8 Hours As Needed For Pain. 12)  Hydrocortisone 1 % Crea (Hydrocortisone) .... Apply As Instructed Under Your Breast 2-3 Times Daily. 13)  Paroxetine Hcl 20 Mg Tabs (Paroxetine Hcl) .... Take 1 Tablet By Mouth Every Morning  Current Medications (verified): 1)  Hyzaar 100-25 Mg Tabs (Losartan Potassium-Hctz) .... Take 1 Tablet By Mouth Once A Day 2)  Aspirin 81 Mg Tbec (Aspirin) .... Take 1 Tablet By Mouth Once A Day 3)  Janumet 50-500 Mg Tabs (Sitagliptin-Metformin Hcl) .... Take 1 Tablet By  Mouth Two Times A Day 4)  Freestyle Lite Test   Strp (Glucose Blood) .... Use To Test Blood Glucsoe Once Time A Day 5)  Lancets   Misc (Lancets) .... Use To Test Blood Glucose One Time A Day 6)  Freestyle Control Solution   Liqd (Blood Glucose Calibration) .... Use To Calibrate Meter and Assure Quality Readings. 7)  Prodigy No Coding Blood Gluc  Strp (Glucose Blood) .... Use To Check Your Sugar 1-2 Times A Day 8)  Prodigy Blood Glucose Monitor  Devi (Blood Glucose Monitoring Suppl) .... Use To Check Your Blood Sugar 9)  Cortisporin 3.5-10000-1 Soln (Neomycin-Polymyxin-Hc) .... 4 Drops Three Times A Day Into Affected Ear. 10)  Pravachol 40 Mg Tabs (Pravastatin Sodium) .... Take 2 Tablets By Mouth Once A Day At Bedtime 11)  Tylenol Extra Strength 500 Mg Tabs (Acetaminophen) .... Take 2 Tabs By Mouth Ever 8 Hours As Needed For Pain. 12)  Amitriptyline Hcl 50 Mg Tabs (Amitriptyline Hcl) .... Take 1 Tab By Mouth At Bedtime 13)  Neurontin 300 Mg Caps (Gabapentin) .... Take 1 Tablet By Mouth At Bedtime Day 1, Then Take 1 Tablet By Mouth Two Times A Day On Day 2, Then Three Times A Day  Allergies: 1)  ! Codeine 2)  ! Vicodin 3)  ! Doxycycline Hyclate (Doxycycline Hyclate) 4)  Penicillin V Potassium (Penicillin V Potassium)  Past History:  Past Medical History: Last updated: 06/01/2010 L sided Bell's palsy 01/2009 Diabetes mellitus, type II Hypertension Headache Tobacco use Hyperlipidemia Allergic rhinitis Hx leg cramps on Lipitor GERD Uterine fibroids Hx chest pain : exercise stress test negative 06-07 Perimenopausal symptoms - hot flashes, vaginal dryness, irritability, difficulty sleeping since LMP in 2005 Insomnia External hemorrhoids  Past Surgical History: Last updated: 05/11/2009 none  Family History: Last updated: Sep 12, 2008 M: died with bad cough 1969 @ 57yo, not sure about health history S: died with bad cough 1996 @ 57yo, of pneumonia  Social History: Last updated:  11/12/2006 Current Smoker  Risk Factors: Alcohol Use: 0 (07/28/2010) Exercise: no (06/01/2010)  Risk Factors: Smoking Status: current (07/28/2010) Packs/Day: 1/2 ppd (07/28/2010) Passive Smoke Exposure: yes (07/28/2010)  Family History: Reviewed history from Sep 12, 2008 and no changes required. M: died with bad cough 1969 @ 57yo, not sure about health history S: died with bad cough 1996 @ 55yo, of pneumonia  Social History: Reviewed history from 11/12/2006 and no changes required. Current Smoker  Review of Systems  The patient denies fever, chest pain, dyspnea on exertion, hemoptysis, abdominal pain, melena, hematochezia, hematuria, muscle weakness, and difficulty walking.    Physical Exam  General:  NAD Mouth:  MMM Neck:  supple.   Lungs:  normal respiratory effort, normal breath sounds, no crackles, and no wheezes.   Heart:  normal rate,  regular rhythm, and no murmur.   Abdomen:  soft, non-tender, and normal bowel sounds.   Msk:  normal ROM, no joint swelling, no joint warmth, and no redness over joints.   Extremities:  no edema Neurologic:  alert & oriented X3, strength normal in all extremities, sensation intact to light touch, and gait normal.     Impression & Recommendations:  Problem # 1:  CERVICAL RADICULOPATHY, RIGHT (ICD-723.4) Working diagnosis for arm pain is cervical radiculopathy as per description of pain and associated symptoms. Patient will be started on neurontin for pain control. Will review cervical xray and follow up in 2 weeks.   Orders: Radiology other (Radiology Other)  Problem # 2:  DIABETES MELLITUS, TYPE II (ICD-250.00) Well controlled. Continue current regimen. Foot exam done for this year.  Her updated medication list for this problem includes:    Hyzaar 100-25 Mg Tabs (Losartan potassium-hctz) .Marland Kitchen... Take 1 tablet by mouth once a day    Aspirin 81 Mg Tbec (Aspirin) .Marland Kitchen... Take 1 tablet by mouth once a day    Janumet 50-500 Mg Tabs  (Sitagliptin-metformin hcl) .Marland Kitchen... Take 1 tablet by mouth two times a day  Orders: T-Hgb A1C (in-house) ([address]UE) T- Capillary Blood Glucose ([address])  Labs Reviewed: Creat: 0.77 (11/17/2009)     Last Eye Exam: No diabetic retinopathy.    (02/08/2009) Reviewed HgBA1c results: 6.2 (07/28/2010)  6.6 (05/01/2010)  Problem # 3:  HYPERTENSION (ICD-401.9) Not at goal. May be due to pain. Recheck on follow up. Consider starting norvasc on follow up if not at goal. Reports to be compliant with medication.  Her updated medication list for this problem includes:    Hyzaar 100-25 Mg Tabs (Losartan potassium-hctz) .Marland Kitchen... Take 1 tablet by mouth once a day  BP today: 148/90 Prior BP: 164/95 (06/01/2010)  Labs Reviewed: K+: 4.2 (11/17/2009) Creat: : 0.77 (11/17/2009)   Chol: 236 (11/17/2009)   HDL: 50 (11/17/2009)   LDL: 129 (11/17/2009)   TG: 284 (11/17/2009)  Problem # 4:  DYSLIPIDEMIA (ICD-272.4) Patient instructed to increase Pravastatin. Check FLP in 2 months.  Her updated medication list for this problem includes:    Pravachol 40 Mg Tabs (Pravastatin sodium) .Marland Kitchen... Take 2 tablets by mouth once a day at bedtime  Complete Medication List: 1)  Hyzaar 100-25 Mg Tabs (Losartan potassium-hctz) .... Take 1 tablet by mouth once a day 2)  Aspirin 81 Mg Tbec (Aspirin) .... Take 1 tablet by mouth once a day 3)  Janumet 50-500 Mg Tabs (Sitagliptin-metformin hcl) .... Take 1 tablet by mouth two times a day 4)  Freestyle Lite Test Strp (Glucose blood) .... Use to test blood glucsoe once time a day 5)  Lancets Misc (Lancets) .... Use to test blood glucose one time a day 6)  Freestyle Control Solution Liqd (Blood glucose calibration) .... Use to calibrate meter and assure quality readings. 7)  Prodigy No Coding Blood Gluc Strp (Glucose blood) .... Use to check your sugar 1-2 times a day 8)  Prodigy Blood Glucose Monitor Devi (Blood glucose monitoring suppl) .... Use to check your blood sugar 9)   Cortisporin 3.5-10000-1 Soln (Neomycin-polymyxin-hc) .... 4 drops three times a day into affected ear. 10)  Pravachol 40 Mg Tabs (Pravastatin sodium) .... Take 2 tablets by mouth once a day at bedtime 11)  Tylenol Extra Strength 500 Mg Tabs (Acetaminophen) .... Take 2 tabs by mouth ever 8 hours as needed for pain. 12)  Amitriptyline Hcl 50 Mg Tabs (Amitriptyline hcl) .... Take 1 tab  by mouth at bedtime 13)  Neurontin 300 Mg Caps (Gabapentin) .... Take 1 tablet by mouth at bedtime day 1, then take 1 tablet by mouth two times a day on day 2, then three times a day   Patient Instructions: 1)  Please schedule a follow-up appointment in 2 weeks. 2)  Take all medication as directed. 3)  You will be called with any abnormalities in the tests scheduled or performed today.  If you don't hear from [place] within a week from when the test was performed, you can assume that your test was normal.  Prescriptions: NEURONTIN 300 MG CAPS (GABAPENTIN) Take 1 tablet by mouth at bedtime day 1, then Take 1 tablet by mouth two times a day on day 2, then three times a day  #90 x 1   Entered and Authorized by:   Laren Everts MD   Signed by:   Laren Everts MD on 07/28/2010   Method used:   Electronically to        [facility] [id number] (retail)       [address] [place], [place]  [address]       Ph: [phone]       Fax: [phone]   RxID:   [id number] AMITRIPTYLINE HCL 50 MG TABS (AMITRIPTYLINE HCL) Take 1 tab by mouth at bedtime  #30 x 1   Entered and Authorized by:   Laren Everts MD   Signed by:   Laren Everts MD on 07/28/2010   Method used:   Electronically to        [facility] [id number] (retail)       [address] [place], [place]  [address]       Ph: [phone]       Fax: [phone]   RxID:   [id number]    Orders Added: 1)  T-Hgb A1C (in-house) [88416SA] 2)  T- Capillary Blood  Glucose [82948] 3)  Radiology other [Radiology Other] 4)  Est. Patient Level III [63[address]]     Prevention & Chronic Care Immunizations   Influenza vaccine: Not documented   Influenza vaccine deferral: Not indicated  (05/01/2010)    Tetanus booster: 05/18/2009: Td   Td booster deferral: Not indicated  (05/01/2010)    Pneumococcal vaccine: Not documented   Pneumococcal vaccine deferral: Not indicated  (05/01/2010)  Colorectal Screening   Hemoccult: Negative  (12/25/2005)   Hemoccult action/deferral: Not indicated  (05/01/2010)    Colonoscopy: One 4 mm polyp in the proximal ascending colon, resected and retrieved.  Two 3-5 mm polyps in the proximal ascending colon, resected and retrieved. Pathology results pending. Exam done by Dr.Vincent Schooler.  (03/22/2008)   Colonoscopy action/deferral: Not indicated  (05/01/2010)   Colonoscopy due: 03/23/2011  Other Screening   Pap smear: NEGATIVE FOR INTRAEPITHELIAL LESIONS OR MALIGNANCY.  (02/28/2009)   Pap smear action/deferral: Not indicated-other  (05/01/2010)   Pap smear due: 02/29/2012    Mammogram: No specific mammographic evidence of malignancy.  Assessment: BIRADS 1.   (03/04/2008)   Mammogram action/deferral: Refused  (05/01/2010)   Mammogram due: 03/2009   Smoking status: current  (07/28/2010)   Smoking cessation counseling: yes  (07/28/2010)  Diabetes Mellitus   HgbA1C: 6.2  (07/28/2010)   HgbA1C action/deferral: Ordered  (05/01/2010)    Eye exam: No diabetic retinopathy.     (  02/08/2009)   Eye exam due: 02/08/2010    Foot exam: yes  (05/01/2010)   Foot exam action/deferral: Do today   High risk foot: No  (06/28/2008)   Foot care education: Done  (05/01/2010)    Urine microalbumin/creatinine ratio: 6.8  (06/28/2008)   Urine microalbumin action/deferral: Deferred   Urine microalbumin/cr due: 06/28/2009  Lipids   Total Cholesterol: 236  (11/17/2009)   Lipid panel action/deferral: Lipid Panel ordered   LDL:  129  (11/17/2009)   LDL Direct: Not documented   HDL: 50  (11/17/2009)   Triglycerides: 284  (11/17/2009)   Lipid panel due: 01/05/2010    SGOT (AST): 17  (11/17/2009)   BMP action: Ordered   SGPT (ALT): 19  (11/17/2009)   Alkaline phosphatase: 70  (11/17/2009)   Total bilirubin: 0.3  (11/17/2009)  Hypertension   Last Blood Pressure: 148 / 90  (07/28/2010)   Serum creatinine: 0.77  (11/17/2009)   BMP action: Ordered   Serum potassium 4.2  (11/17/2009)   Basic metabolic panel due: 01/05/2010  Self-Management Support :   Personal Goals (by the next clinic visit) :     Personal A1C goal: 6  (11/16/2009)     Personal blood pressure goal: 130/80  (05/11/2009)     Personal LDL goal: 70  (11/16/2009)    Patient will work on the following items until the next clinic visit to reach self-care goals:     Medications and monitoring: take my medicines every day, examine my feet every day  (07/28/2010)     Eating: eat foods that are low in salt, eat baked foods instead of fried foods  (07/28/2010)     Activity: take a 30 minute walk every day, take the stairs instead of the elevator  (06/01/2010)     Other: walk every night  (05/01/2010)     Home glucose monitoring frequency: 1 time daily  (11/16/2009)    Diabetes self-management support: Written self-care plan, Education handout, Resources for patients handout  (06/01/2010)   Last diabetes self-management training by diabetes educator: 01/05/2009    Hypertension self-management support: Written self-care plan, Education handout, Resources for patients handout  (06/01/2010)    Lipid self-management support: Written self-care plan, Education handout, Resources for patients handout  (06/01/2010)   Laboratory Results   Blood Tests   Date/Time Received: July 28, 2010 1:58 PM  Date/Time Reported: Burke Keels  July 28, 2010 1:58 PM   HGBA1C: 6.2%   (Normal Range: Non-Diabetic - 3-6%   Control Diabetic - 6-8%) CBG Random::  183mg /dL

## 2010-11-07 NOTE — Assessment & Plan Note (Signed)
Summary: testing supplies/dmr  patient presented requesting a new glucometer. Medicaid DME form completed and given to patient per [facility] pharmacists' request, Freestyle Lite meter givien and patient demonstrated proper use with CBg results of 214 mg/dl fasting per patient. Needs Rx sent to [facility] for 1x/day testing

## 2010-11-07 NOTE — Progress Notes (Signed)
  Phone Note Call from Patient      New/Updated Medications: FLUCONAZOLE 150 MG TABS (FLUCONAZOLE) Take 1 pill once. Patient still complaining of vaginal itching. I explained her that her wet prep results came back normal.   Prescriptions: FLUCONAZOLE 150 MG TABS (FLUCONAZOLE) Take 1 pill once.  #1 x 0   Entered and Authorized by:   Blondell Reveal MD   Signed by:   Blondell Reveal MD on 10/15/2008   Method used:   Electronically to        [facility] [id number] (retail)       [address] [place], [place]  [address]       Ph: [phone]       Fax: [phone]   RxID:   [id number]    Impression & Recommendations:  Problem # 1:  PRURITUS, VAGINAL (ICD-698.1) Her wet prep is normal. Patient reports that her itching is bothering a lot. We will treat her empirically with one dose of fluconazole and will follow up. will send a electronic prescription to her pharmacy. If her itching does not respond to the fluconazole consider thinking about atrophic post-menopausal changes vs vulval maligancies (very unlikely) and consider intravaginal estrogen creams for a short course and see if they help.  Complete Medication List: 1)  Hyzaar 100-25 Mg Tabs (Losartan potassium-hctz) .... Take 1 tablet by mouth once a day 2)  Aspirin 81 Mg Tbec (Aspirin) .... Take 1 tablet by mouth once a day 3)  Janumet 50-500 Mg Tabs (Sitagliptin-metformin hcl) .... Take 1 tablet by mouth two times a day 4)  Tylenol 8 Hour 650 Mg Tbcr (Acetaminophen) .... Take 1 tablet by mouth three times a day 5)  Protonix 40 Mg Pack (Pantoprazole sodium) .... Take one tablet by mouth daily 6)  Caduet 5-20 Mg Tabs (Amlodipine-atorvastatin) .... Take 1 tablet by mouth once a day 7)  Amitriptyline Hcl 25 Mg Tabs (Amitriptyline hcl) .... Take 1 tablet by mouth at bedtime 8)  Freestyle Lite Test Strp (Glucose blood) .... Use to test blood glucsoe once time a day 9)  Lancets Misc (Lancets) ....  Use to test blood glucose one time a day 10)  Freestyle Control Solution Liqd (Blood glucose calibration) .... Use to calibrate meter and assure quality readings. 11)  Catapres 0.2 Mg Tabs (Clonidine hcl) .... Take 1 pill once daily at bed time. 12)  Loratadine 10 Mg Tabs (Loratadine) .... Take 1 tablet by mouth once a day 13)  Guaifenesin-codeine 200-10 Mg/49ml Liqd (Guaifenesin-codeine) .... Take 5-6ml by mouth up to every 6 hours as needed for cough 14)  Protonix 40 Mg Pack (Pantoprazole sodium) .... Take 1 tablet by mouth two times a day 15)  Fluconazole 150 Mg Tabs (Fluconazole) .... Take 1 pill once.

## 2010-11-07 NOTE — Assessment & Plan Note (Signed)
Summary: EST-NOT SLEEPING/CFB   Vital Signs:  Patient profile:   55 year old female Height:      65 inches (165.10 cm) Weight:      180.04 pounds (81.84 kg) BMI:     30.07 Temp:     96.7 degrees F (35.94 degrees C) oral Pulse rate:   79 / minute BP sitting:   156 / 92  (right arm)  Vitals Entered By: Angelina Ok RN (June 20, 2009 2:29 PM) Is Patient Diabetic? Yes  Pain Assessment Patient in pain? yes     Location: side and ear Intensity: 7 Type: sharp Onset of pain  Constant Nutritional Status BMI of > 30 = obese CBG Result 118  Have you ever been in a relationship where you felt threatened, hurt or afraid?No   Does patient need assistance? Functional Status Self care Ambulation Normal Comments Out of B/P medicine.  Ear and side pain.  Problems sleeping.   Primary Care Provider:  Blondell Reveal MD   History of Present Illness: 55 years old female with PMH of DM, HTN, HLD comes to the office for a routene visit. She states that she has been having RLQ abdominal pain that started about 2-4 weeks ago, intermittent, sometimes get relief relief with Advil but not with tylenol. She denies any fever, chills, nausea, vomiting, dysurea, radiation of the pain. She denies any other complaints. She is not sexually active and denies any vaginal secretions. She reports that she is still having hot flashes and wants something to be done.   Preventive Screening-Counseling & Management  Alcohol-Tobacco     Alcohol drinks/day: 0     Smoking Status: current     Smoking Cessation Counseling: yes     Packs/Day: 0.5     Year Started: at the age of 19     Passive Smoke Exposure: yes  Medications Prior to Update: 1)  Hyzaar 100-25 Mg Tabs (Losartan Potassium-Hctz) .... Take 1 Tablet By Mouth Once A Day 2)  Aspirin 81 Mg Tbec (Aspirin) .... Take 1 Tablet By Mouth Once A Day 3)  Janumet 50-500 Mg Tabs (Sitagliptin-Metformin Hcl) .... Take 1 Tablet By Mouth Two Times A Day 4)  Tylenol  8 Hour 650 Mg  Tbcr (Acetaminophen) .... Take 1 Tablet By Mouth Three Times A Day 5)  Freestyle Lite Test   Strp (Glucose Blood) .... Use To Test Blood Glucsoe Once Time A Day 6)  Lancets   Misc (Lancets) .... Use To Test Blood Glucose One Time A Day 7)  Freestyle Control Solution   Liqd (Blood Glucose Calibration) .... Use To Calibrate Meter and Assure Quality Readings. 8)  Catapres 0.1 Mg Tabs (Clonidine Hcl) .... Take 1 Tab By Mouth At Bedtime 9)  Prodigy No Coding Blood Gluc  Strp (Glucose Blood) .... Use To Check Your Sugar 1-2 Times A Day 10)  Prodigy Blood Glucose Monitor  Devi (Blood Glucose Monitoring Suppl) .... Use To Check Your Blood Sugar 11)  Premarin 0.625 Mg/gm Crea (Estrogens, Conjugated) .... Apply Once Daily For 2 Weeks. Twice Weekly Thereafter 12)  Claritin 10 Mg Tabs (Loratadine) .... Take 1 Tablet By Mouth Once A Day 13)  Cortisporin 3.5-10000-1 Soln (Neomycin-Polymyxin-Hc) .... 4 Drops Three Times A Day Into Affected Ear. 14)  Flunisolide 0.025 % Soln (Flunisolide) .... 2 Sprays in Each Nostril Twice Daily  Allergies: 1)  ! Codeine 2)  ! Vicodin 3)  ! Doxycycline Hyclate (Doxycycline Hyclate)  Past History:  Past Medical History: Last updated:  02/28/2009 L sided Bell's palsy 01/2009 Diabetes mellitus, type II Hypertension Headache Tobacco use Hyperlipidemia Allergic rhinitis Hx leg cramps on Lipitor GERD Uterine fibroids Hx chest pain : exercise stress test negative 06-07 Perimenopausal ? Insomnia External hemorrhoids  Family History: Last updated: 09-06-08 M: died with bad cough 1969 @ 57yo, not sure about health history S: died with bad cough 1996 @ 55yo, of pneumonia  Family History: Reviewed history from 06-Sep-2008 and no changes required. M: died with bad cough 1969 @ 57yo, not sure about health history S: died with bad cough 1996 @ 55yo, of pneumonia  Review of Systems General:  Denies chills, fatigue, fever, loss of appetite, malaise,  sleep disorder, sweats, weakness, and weight loss. CV:  Denies bluish discoloration of lips or nails, difficulty breathing at night, fatigue, lightheadness, and palpitations. Resp:  Denies chest discomfort, cough, pleuritic, shortness of breath, and wheezing. GI:  Complains of abdominal pain and gas; denies bloody stools, constipation, diarrhea, indigestion, nausea, and vomiting. GU:  Denies abnormal vaginal bleeding, discharge, dysuria, genital sores, hematuria, and incontinence.  Physical Exam  General:  Well-developed,well-nourished,in no acute distress; alert,appropriate and cooperative throughout examination Head:  Normocephalic and atraumatic without obvious abnormalities. No apparent alopecia or balding. Ears:  External ear exam shows no significant lesions or deformities.  Otoscopic examination reveals clear canals, tympanic membranes are intact bilaterally without bulging, retraction, inflammation or discharge. Hearing is grossly normal bilaterally. Mouth:  Oral mucosa and oropharynx without lesions or exudates.  poor dentition. Neck:  No deformities, masses, or tenderness noted. Lungs:  Normal respiratory effort, chest expands symmetrically. Lungs are clear to auscultation, no crackles or wheezes. Heart:  Normal rate and regular rhythm. S1 and S2 normal without gallop, murmur, click, rub or other extra sounds. Abdomen:  Bowel sounds positive,abdomen soft and non-tender without masses, organomegaly or hernias noted. There is only very mild tenderness to deep palpation over the RLQ area. No guarding, rigidity noted Msk:  No deformity or scoliosis noted of thoracic or lumbar spine.   Pulses:  R radial normal.   Extremities:  No clubbing, cyanosis, edema, or deformity noted with normal full range of motion of all joints.   Neurologic:  alert & oriented X3, strength normal in all extremities, and gait normal.     Impression & Recommendations:  Problem # 1:  DIABETES MELLITUS, TYPE II  (ICD-250.00)  A1C is 6.5 . She didnt bring her glucomtere. Cont current management.  Orders: T- Capillary Blood Glucose (29518) T-Hgb A1C (in-house) ([id number])   Labs Reviewed: Creat: 0.74 (01/05/2009)     Last Eye Exam: No diabetic retinopathy.    (02/08/2009) Reviewed HgBA1c results: 6.5 (06/20/2009)  6.6 (03/21/2009)   Problem # 2:  HYPERTENSION (ICD-401.9)  Slightly elevated. Increase Clonidine to 0.2 mg to help with the hot flashes symptoms. She states that she missed her meds today. No other changes made.   BP today: 156/92 Prior BP: 155/89 (05/11/2009)  Labs Reviewed: K+: 4.1 (01/05/2009) Creat: : 0.74 (01/05/2009)   Chol: 155 (01/05/2009)   HDL: 44 (01/05/2009)   LDL: 94 (01/05/2009)   TG: 83 (01/05/2009)    Problem # 3:  RLQ PAIN (ICD-789.03) Suspect MSK vs Pelvic pathology. Will do Hemoccults, US abdomen to rule Pelvic pathology. No alarming signs at this point. Advised to take tylenol as needed and ibuprofen for a few days if needed. Will follow up on the Hemoccults and [place].  Orders: T-Ultrasound Abdominal, Complete ([id number])  Problem # 4:  CIGARETTE SMOKER (ICD-305.1) Councelled  her about cessation. She wants to try nicotine patches. Will do 21 mg for 6 weeks, 14 mg for 4 weeks and then 7 mg for 2 weeks. She doesn't want to try gums. Will follow up.  Problem # 5:  PERIMENOPAUSAL STATUS (ICD-627.2)  Problem # 6:  Preventive Health Care (ICD-V70.0) Orders: T-Hemoccult Card-Multiple (take home) ([id number])  Complete Medication List: 1)  Hyzaar 100-25 Mg Tabs (Losartan potassium-hctz) .... Take 1 tablet by mouth once a day 2)  Aspirin 81 Mg Tbec (Aspirin) .... Take 1 tablet by mouth once a day 3)  Janumet 50-500 Mg Tabs (Sitagliptin-metformin hcl) .... Take 1 tablet by mouth two times a day 4)  Tylenol 8 Hour 650 Mg Tbcr (Acetaminophen) .... Take 1 tablet by mouth three times a day 5)  Freestyle Lite Test Strp (Glucose blood) .... Use to test blood glucsoe once  time a day 6)  Lancets Misc (Lancets) .... Use to test blood glucose one time a day 7)  Freestyle Control Solution Liqd (Blood glucose calibration) .... Use to calibrate meter and assure quality readings. 8)  Catapres 0.1 Mg Tabs (Clonidine hcl) .... Take 2 tablets by mouth at bedtime 9)  Prodigy No Coding Blood Gluc Strp (Glucose blood) .... Use to check your sugar 1-2 times a day 10)  Prodigy Blood Glucose Monitor Devi (Blood glucose monitoring suppl) .... Use to check your blood sugar 11)  Premarin 0.625 Mg/gm Crea (Estrogens, conjugated) .... Apply once daily for 2 weeks. twice weekly thereafter 12)  Claritin 10 Mg Tabs (Loratadine) .... Take 1 tablet by mouth once a day 13)  Cortisporin 3.5-10000-1 Soln (Neomycin-polymyxin-hc) .... 4 drops three times a day into affected ear. 14)  Flunisolide 0.025 % Soln (Flunisolide) .... 2 sprays in each nostril twice daily 15)  Nicotine 21 Mg/24hr Pt24 (Nicotine) .... Apply one patch one daily as directed for 6 weeks. 16)  Nicotine 14 Mg/24hr Pt24 (Nicotine) .... Apply one patch one daily as directed for 4 weeks. 17)  Nicotine 7 Mg/24hr Pt24 (Nicotine) .... Apply one patch one daily as directed for 2 weeks. T- Capillary Blood Glucose ([id number]) T-Hgb A1C (in-house) ([id number]) T-Ultrasound Abdominal, Complete ([id number]) T-Hemoccult Card-Multiple (take home) ([id number]) T-Lipid Profile ([id number])  Patient Instructions: 1)  Please schedule a follow-up appointment in 2 months. 2)  Take all the medications as advised below. 3)  Take 2 tablets of Clonidine at bed time. 4)  Tobacco is very bad for your health and your loved ones! You Should stop smoking!. 5)  Stop Smoking Tips: Choose a Quit date. Cut down before the Quit date. decide what you will do as a substitute when you feel the urge to smoke(gum,toothpick,exercise). 6)  Use Nicotine patches as directed. 7)  It is important that you exercise regularly at least 20 minutes 5 times a week. If you develop chest  pain, have severe difficulty breathing, or feel very tired , stop exercising immediately and seek medical attention. 8)  You need to lose weight. Consider a lower calorie diet and regular exercise.  9)  Check your blood sugars regularly. If your readings are usually above :300or below 70 you should contact our office. Prescriptions: NICOTINE 7 MG/24HR PT24 (NICOTINE) apply one patch one daily as directed for 2 weeks.  #14 x 0   Entered and Authorized by:   Blondell Reveal MD   Signed by:   Blondell Reveal MD on 06/20/2009   Method used:   Print then Give to Patient   RxID:  [id number] NICOTINE 14 MG/24HR PT24 (NICOTINE) apply one patch one daily as directed for 4 weeks.  #28 x 0   Entered and Authorized by:   Blondell Reveal MD   Signed by:   Blondell Reveal MD on 06/20/2009   Method used:   Print then Give to Patient   RxID:   [id number] NICOTINE 21 MG/24HR PT24 (NICOTINE) apply one patch one daily as directed for 6 weeks.  #42 x 0   Entered and Authorized by:   Blondell Reveal MD   Signed by:   Blondell Reveal MD on 06/20/2009   Method used:   Print then Give to Patient   RxID:   [id number]   Prevention & Chronic Care Immunizations   Influenza vaccine: Not documented   Influenza vaccine deferral: Refused  (05/11/2009)    Tetanus booster: 05/18/2009: Td    Pneumococcal vaccine: Not documented  Colorectal Screening   Hemoccult: Negative  (12/25/2005)    Colonoscopy: One 4 mm polyp in the proximal ascending colon, resected and retrieved.  Two 3-5 mm polyps in the proximal ascending colon, resected and retrieved. Pathology results pending. Exam done by Dr.Vincent Schooler.  (03/22/2008)   Colonoscopy action/deferral: Repeat colonoscopy in 1-3 months.    (01/06/2008)   Colonoscopy due: 03/23/2011  Other Screening   Pap smear: NEGATIVE FOR INTRAEPITHELIAL LESIONS OR MALIGNANCY.  (02/28/2009)   Pap smear due: 02/29/2012    Mammogram: No specific mammographic evidence of  malignancy.  Assessment: BIRADS 1.   (03/04/2008)   Mammogram action/deferral: Ordered  (05/11/2009)   Mammogram due: 03/2009   Smoking status: current  (06/20/2009)   Smoking cessation counseling: yes  (06/20/2009)  Diabetes Mellitus   HgbA1C: 6.5  (06/20/2009)    Eye exam: No diabetic retinopathy.     (02/08/2009)   Eye exam due: 02/08/2010    Foot exam: yes  (08/31/2008)   High risk foot: No  (06/28/2008)   Foot care education: Done  (05/11/2009)    Urine microalbumin/creatinine ratio: 6.8  (06/28/2008)   Urine microalbumin/cr due: 06/28/2009  Lipids   Total Cholesterol: 155  (01/05/2009)   LDL: 94  (01/05/2009)   LDL Direct: Not documented   HDL: 44  (01/05/2009)   Triglycerides: 83  (01/05/2009)   Lipid panel due: 01/05/2010    SGOT (AST): 14  (01/05/2009)   SGPT (ALT): 16  (01/05/2009)   Alkaline phosphatase: 62  (01/05/2009)   Total bilirubin: 0.3  (01/05/2009)  Hypertension   Last Blood Pressure: 156 / 92  (06/20/2009)   Serum creatinine: 0.74  (01/05/2009)   Serum potassium 4.1  (01/05/2009)   Basic metabolic panel due: 01/05/2010  Self-Management Support :   Personal Goals (by the next clinic visit) :     Personal A1C goal: 7  (05/11/2009)     Personal blood pressure goal: 130/80  (05/11/2009)     Personal LDL goal: 100  (05/11/2009)    Diabetes self-management support: Not documented   Last diabetes self-management training by diabetes educator: 01/05/2009    Hypertension self-management support: Not documented    Lipid self-management support: Not documented   Process Orders Check Orders Results:     [facility]: ABN not required for this insurance Tests Sent for requisitioning (June 20, 2009 3:20 PM):     06/20/2009: [facility] -- T-Lipid Profile [phone] (signed)    Laboratory Results   Blood Tests   Date/Time Received: June 20, 2009  Date/Time Reported: Starleen Arms [facility]  June 20, 2009  2:48 PM   HGBA1C: 6.5%   (Normal Range: Non-Diabetic - 3-6%   Control Diabetic - 6-8%) CBG Random:: 118       Impression & Recommendations:  Her updated medication list for this problem includes:    Hyzaar 100-25 Mg Tabs (Losartan potassium-hctz) .Marland Kitchen... Take 1 tablet by mouth once a day    Aspirin 81 Mg Tbec (Aspirin) .Marland Kitchen... Take 1 tablet by mouth once a day    Janumet 50-500 Mg Tabs (Sitagliptin-metformin hcl) .Marland Kitchen... Take 1 tablet by mouth two times a day       Orders: T- Capillary Blood Glucose ([id number]) T-Hgb A1C (in-house) ([id number])       Her updated medication list for this problem includes:    Hyzaar 100-25 Mg Tabs (Losartan potassium-hctz) .Marland Kitchen... Take 1 tablet by mouth once a day    Aspirin 81 Mg Tbec (Aspirin) .Marland Kitchen... Take 1 tablet by mouth once a day    Janumet 50-500 Mg Tabs (Sitagliptin-metformin hcl) .Marland Kitchen... Take 1 tablet by mouth two times a day  Her updated medication list for this problem includes:    Hyzaar 100-25 Mg Tabs (Losartan potassium-hctz) .Marland Kitchen... Take 1 tablet by mouth once a day    Aspirin 81 Mg Tbec (Aspirin) .Marland Kitchen... Take 1 tablet by mouth once a day    Janumet 50-500 Mg Tabs (Sitagliptin-metformin hcl) .Marland Kitchen... Take 1 tablet by mouth two times a day  Her updated medication list for this problem includes:    Hyzaar 100-25 Mg Tabs (Losartan potassium-hctz) .Marland Kitchen... Take 1 tablet by mouth once a day    Aspirin 81 Mg Tbec (Aspirin) .Marland Kitchen... Take 1 tablet by mouth once a day    Janumet 50-500 Mg Tabs (Sitagliptin-metformin hcl) .Marland Kitchen... Take 1 tablet by mouth two times a day   Her updated medication list for this problem includes:    Hyzaar 100-25 Mg Tabs (Losartan potassium-hctz) .Marland Kitchen... Take 1 tablet by mouth once a day    Catapres 0.1 Mg Tabs (Clonidine hcl) .Marland Kitchen... Take 2 tablets by mouth at bedtime       Orders: T-Ultrasound Abdominal, Complete ([id number])       Her updated medication list for this problem includes:    Nicotine 21 Mg/24hr Pt24  (Nicotine) .Marland Kitchen... Apply one patch one daily as directed for 6 weeks.    Nicotine 14 Mg/24hr Pt24 (Nicotine) .Marland Kitchen... Apply one patch one daily as directed for 4 weeks.    Nicotine 7 Mg/24hr Pt24 (Nicotine) .Marland Kitchen... Apply one patch one daily as directed for 2 weeks.   Her updated medication list for this problem includes:    Premarin 0.625 Mg/gm Crea (Estrogens, conjugated) .Marland Kitchen... Apply once daily for 2 weeks. twice weekly thereafter     Orders: T-Hemoccult Card-Multiple (take home) ([id number])     Complete Medication List: 1)  Hyzaar 100-25 Mg Tabs (Losartan potassium-hctz) .... Take 1 tablet by mouth once a day 2)  Aspirin 81 Mg Tbec (Aspirin) .... Take 1 tablet by mouth once a day 3)  Janumet 50-500 Mg Tabs (Sitagliptin-metformin hcl) .... Take 1 tablet by mouth two times a day 4)  Tylenol 8 Hour 650 Mg Tbcr (Acetaminophen) .... Take 1 tablet by mouth three times a day 5)  Freestyle Lite Test Strp (Glucose blood) .... Use to test blood glucsoe once time a day 6)  Lancets Misc (Lancets) .... Use to test blood glucose one time a day 7)  Freestyle Control Solution Liqd (Blood glucose calibration) .... Use to calibrate meter and assure quality readings. 8)  Catapres 0.1 Mg Tabs (Clonidine hcl) .... Take 2 tablets by mouth at bedtime 9)  Prodigy No Coding Blood Gluc Strp (Glucose blood) .... Use to check your sugar 1-2 times a day 10)  Prodigy Blood Glucose Monitor Devi (Blood glucose monitoring suppl) .... Use to check your blood sugar 11)  Premarin 0.625 Mg/gm Crea (Estrogens, conjugated) .... Apply once daily for 2 weeks. twice weekly thereafter 12)  Claritin 10 Mg Tabs (Loratadine) .... Take 1 tablet by mouth once a day 13)  Cortisporin 3.5-10000-1 Soln (Neomycin-polymyxin-hc) .... 4 drops three times a day into affected ear. 14)  Flunisolide 0.025 % Soln (Flunisolide) .... 2 sprays in each nostril twice daily 15)  Nicotine 21 Mg/24hr Pt24 (Nicotine) .... Apply one patch one daily as directed  for 6 weeks. 16)  Nicotine 14 Mg/24hr Pt24 (Nicotine) .... Apply one patch one daily as directed for 4 weeks. 17)  Nicotine 7 Mg/24hr Pt24 (Nicotine) .... Apply one patch one daily as directed for 2 weeks.  Other Orders: T-Lipid Profile [id number])   Process Orders Check Orders Results:     [facility]: ABN not required for this insurance Tests Sent for requisitioning (June 20, 2009 3:20 PM):     06/20/2009: [facility] -- T-Lipid Profile [phone] (signed)

## 2010-11-07 NOTE — Assessment & Plan Note (Signed)
Summary: R/S NEW TO DR. / SB.   Vital Signs:  Patient Profile:   55 Years Old Female Height:     67 inches (170.18 cm) Weight:      186.7 pounds (84.86 kg) BMI:     29.35 Temp:     97.3 degrees F (36.28 degrees C) oral Pulse rate:   74 / minute BP sitting:   136 / 81  (left arm)  Pt. in pain?   yes    Location:   right ear    Intensity:   8  Vitals Entered By: Stanton Kidney Ditzler RN (April 23, 2008 10:25 AM)              Is Patient Diabetic? Yes  Nutritional Status BMI of 25 - 29 = overweight Nutritional Status Detail appetite good CBG Result 155  Have you ever been in a relationship where you felt threatened, hurt or afraid?denies   Does patient need assistance? Functional Status Self care Ambulation Normal     PCP:  Dellia Beckwith MD  Chief Complaint:  ck-up and right ear hurting x 2 months. White-yellow thick productive cough ? blood.Marland Kitchen  History of Present Illness: 55 yr old female patient with PMH of HTN, DM-II comes to the office for a follow up of her last visit.  Patient was complaining of pain in the right leg.  We took an X ray of her right leg and ankle and there were no acute bony injuries. We treated her with tylenol.  She doesnt have any pain in the right leg and is feeling better now. Patient was complaining of hot flashes and symptoms of perimenopausal period. We started her on clonidine 0.1mg , and she says she feels a lot better now.  Patient complains of cold and cough for the past 3-4 days. H/O Cold like illness in one of the family memebers. No H/O fever, SOB, chest pain, swelling of legs. Cough is productive with light greenish colored sputum.   She denies any other complaints.      Prior Medications Reviewed Using: Patient Recall  Prior Medication List:  HYDROCHLOROTHIAZIDE 25 MG TABS (HYDROCHLOROTHIAZIDE) Take 1 tablet by mouth once a day COZAAR 100 MG TABS (LOSARTAN POTASSIUM) Take 1 tablet by mouth once a day ASPIRIN 81 MG TBEC (ASPIRIN) Take 1  tablet by mouth once a day METFORMIN HCL 500 MG  TABS (METFORMIN HCL) Take 1 tablet by mouth two times a day TYLENOL 8 HOUR 650 MG  TBCR (ACETAMINOPHEN) Take 1 tablet by mouth three times a day PROTONIX 40 MG  PACK (PANTOPRAZOLE SODIUM) Take one tablet by mouth daily AMLODIPINE BESYLATE 5 MG  TABS (AMLODIPINE BESYLATE) Take 1 tablet by mouth once a day AMITRIPTYLINE HCL 25 MG  TABS (AMITRIPTYLINE HCL) Take 1 tablet by mouth at bedtime FREESTYLE LITE TEST   STRP (GLUCOSE BLOOD) use to test blood glucsoe once time a day [BMN] LANCETS   MISC (LANCETS) use to test blood glucose one time a day FREESTYLE CONTROL SOLUTION   LIQD (BLOOD GLUCOSE CALIBRATION) use to calibrate meter and assure quality readings. [BMN] NASONEX 50 MCG/ACT  SUSP (MOMETASONE FUROATE) apply 2 sprays each nostril at bedtime. CLONIDINE HCL 0.1 MG  TABS (CLONIDINE HCL) Take 1 tablet by mouth at bedtime   Current Allergies (reviewed today): ! CODEINE ! VICODIN ! DOXYCYCLINE HYCLATE (DOXYCYCLINE HYCLATE)  Past Medical History:    Reviewed history from 04/22/2007 and no changes required:       Diabetes mellitus, type II  Hypertension       Headache       Tobacco use       Hyperlipidemia       Allergic rhinitis       Hx leg cramps on Lipitor       GERD       Uterine fibroids       Hx chest pain : exercise stress test negative 06-07       Perimenopausal       Insomnia       External hemorrhoids       Menopause       Atrophic vaginitis   Family History:    Reviewed history and no changes required:  Social History:    Reviewed history from 11/12/2006 and no changes required:       Current Smoker   Risk Factors: Tobacco use:  current    Year started:  at the age of 76    Cigarettes:  Yes -- 1 pack(s) per day Passive smoke exposure:  yes Alcohol use:  no Exercise:  yes    Times per week:  2    Type:  walkiing Seatbelt use:  100 %  Colonoscopy History:    Date of Last Colonoscopy:  03/22/2008   Mammogram History:    Date of Last Mammogram:  03/04/2008  PAP Smear History:    Date of Last PAP Smear:  02/24/2008   Review of Systems      See HPI  General      Denies chills, fever, malaise, weakness, and weight loss.  ENT      Denies ear discharge, ringing in ears, sinus pressure, and sore throat.      complains of nasal congestion and runny nose.c/o pain the left ear.  CV      Denies chest pain or discomfort, difficulty breathing at night, fainting, fatigue, lightheadness, palpitations, and swelling of feet.  Resp      Denies cough, pleuritic, shortness of breath, and wheezing.  GI      Denies abdominal pain, constipation, diarrhea, nausea, and vomiting.   Physical Exam  General:     alert, normal appearance, healthy-appearing, cooperative to examination, and good hygiene.   Head:     normocephalic and atraumatic.   Eyes:     vision grossly intact.   Ears:     R ear normal, L ear normal, and no external deformities.  Mild cerumen found in the left ear. Nose:     no nasal discharge, no mucosal pallor, no mucosal edema, and no airflow obstruction.   Mouth:     pharynx pink and moist, no erythema, no exudates, and no posterior lymphoid hypertrophy.   Neck:     supple and no masses.   Lungs:     normal respiratory effort, no intercostal retractions, no accessory muscle use, normal breath sounds, and no dullness.   Heart:     normal rate, regular rhythm, no murmur, no gallop, no rub, and no JVD.   Abdomen:     soft, non-tender, normal bowel sounds, no distention, no masses, no guarding, and no rigidity.   Pulses:     R radial normal.   Extremities:     no pedal edema. Neurologic:     alert & oriented X3.      Impression & Recommendations:  Problem # 1:  COUGH (ICD-786.2) Patient complains of cough and cold for the last 3-5 days. H/O exposure to cold like illness at home.  We will treat her symptomatically. We will give her Claritin-D for congestion and  cold and Guaifenesin for cough. We will follow up her in 1 month.  Her updated medication list for this problem includes:  Claritin-D 1 tablet twice daily, by mouth.  Guaifenesin- 5 ml syrup, three times daily, by mouth.   Problem # 2:  DIABETES MELLITUS, TYPE II (ICD-250.00) No changes made during this visit. Advised to continue the same medications.  Her updated medication list for this problem includes:    Cozaar 100 Mg Tabs (Losartan potassium) .Marland Kitchen... Take 1 tablet by mouth once a day    Aspirin 81 Mg Tbec (Aspirin) .Marland Kitchen... Take 1 tablet by mouth once a day    Metformin Hcl 500 Mg Tabs (Metformin hcl) .Marland Kitchen... Take 1 tablet by mouth two times a day  Orders: Capillary Blood Glucose (16109) Fingerstick (60454)  Labs Reviewed: HgBA1c: 7.4 (02/24/2008)   Creat: 0.73 (10/29/2007)      Problem # 3:  HYPERTENSION (ICD-401.9) Her BP today is well controlled. Advised to continue the same medications.  Her updated medication list for this problem includes:    Hydrochlorothiazide 25 Mg Tabs (Hydrochlorothiazide) .Marland Kitchen... Take 1 tablet by mouth once a day    Cozaar 100 Mg Tabs (Losartan potassium) .Marland Kitchen... Take 1 tablet by mouth once a day    Amlodipine Besylate 5 Mg Tabs (Amlodipine besylate) .Marland Kitchen... Take 1 tablet by mouth once a day    Clonidine Hcl 0.1 Mg Tabs (Clonidine hcl) .Marland Kitchen... Take 1 tablet by mouth at bedtime  BP today: 136/81 Prior BP: 143/89 (04/07/2008)  Labs Reviewed: Creat: 0.73 (10/29/2007) Chol: 222 (12/25/2005)   HDL: 56 (12/25/2005)   LDL: 150 (12/25/2005)   TG: 78 (12/25/2005)   Problem # 4:  DYSLIPIDEMIA (ICD-272.4) Patients cholesterol levels were elevated back in 2007. She is not any medication. We will check her lipid profile during next visit and will assess the need for any medications.  Labs Reviewed: Chol: 222 (12/25/2005)   HDL: 56 (12/25/2005)   LDL: 150 (12/25/2005)   TG: 78 (12/25/2005) SGOT: 14 (10/29/2007)   SGPT: 20 (10/29/2007)   Complete Medication List:  1)  Hydrochlorothiazide 25 Mg Tabs (Hydrochlorothiazide) .... Take 1 tablet by mouth once a day 2)  Cozaar 100 Mg Tabs (Losartan potassium) .... Take 1 tablet by mouth once a day 3)  Aspirin 81 Mg Tbec (Aspirin) .... Take 1 tablet by mouth once a day 4)  Metformin Hcl 500 Mg Tabs (Metformin hcl) .... Take 1 tablet by mouth two times a day 5)  Tylenol 8 Hour 650 Mg Tbcr (Acetaminophen) .... Take 1 tablet by mouth three times a day 6)  Protonix 40 Mg Pack (Pantoprazole sodium) .... Take one tablet by mouth daily 7)  Amlodipine Besylate 5 Mg Tabs (Amlodipine besylate) .... Take 1 tablet by mouth once a day 8)  Amitriptyline Hcl 25 Mg Tabs (Amitriptyline hcl) .... Take 1 tablet by mouth at bedtime 9)  Freestyle Lite Test Strp (Glucose blood) .... Use to test blood glucsoe once time a day 10)  Lancets Misc (Lancets) .... Use to test blood glucose one time a day 11)  Freestyle Control Solution Liqd (Blood glucose calibration) .... Use to calibrate meter and assure quality readings. 12)  Nasonex 50 Mcg/act Susp (Mometasone furoate) .... Apply 2 sprays each nostril at bedtime. 13)  Clonidine Hcl 0.1 Mg Tabs (Clonidine hcl) .... Take 1 tablet by mouth at bedtime 14)  Altarussin 100 Mg/39ml Syrp (Guaifenesin) .... Take 5  ml twice a day, as advised. 15)  Claritin-d 12 Hour 5-120 Mg Xr12h-tab (Loratadine-pseudoephedrine) .... Take 1 tablet, twice dail, by mouth.   Patient Instructions: 1)  Please schedule a follow-up appointment in 1 month. 2)  Take Claritin -D, 1 tablet, twice daily, by mouth. 3)  Take Guaifenesin, 5ml , three times daily, by mouth. 4)  Take other medications as advised before. 5)  Call or inform us if you have any problems with the medications.    Prescriptions: CLARITIN-D 12 HOUR 5-120 MG  XR12H-TAB (LORATADINE-PSEUDOEPHEDRINE) Take 1 tablet, twice dail, by mouth.  #20 x 0   Entered by:   Blondell Reveal MD   Authorized by:   Manning Charity MD   Signed by:   Blondell Reveal MD on  04/23/2008   Method used:   Print then Give to Patient   RxID:   [id number] ALTARUSSIN 100 MG/5ML  SYRP (GUAIFENESIN) Take 5 ml twice a day, as advised.  #1 x 0   Entered by:   Blondell Reveal MD   Authorized by:   Manning Charity MD   Signed by:   Blondell Reveal MD on 04/23/2008   Method used:   Print then Give to Patient   RxID:   [id number]  ]

## 2010-11-07 NOTE — Letter (Signed)
Summary: Referral : Appt. Notice  Referral : Appt. Notice   Imported By: Florinda Marker 10/10/2009 16:06:05  _____________________________________________________________________  External Attachment:    Type:   Image     Comment:   External Document

## 2010-11-07 NOTE — Letter (Signed)
Summary: Handout Printed  Printed Handout:  - *Patient Instructions 

## 2010-11-07 NOTE — Assessment & Plan Note (Signed)
Summary: EST-PAP SMEAR/CFB   Vital Signs:  Patient Profile:   55 Years Old Female Weight:      189.3 pounds (86.05 kg) Temp:     97.2 degrees F (36.22 degrees C) oral Pulse rate:   86 / minute BP sitting:   160 / 93  (right arm)  Pt. in pain?   no  Vitals Entered By: Krystal Eaton Duncan Dull) (December 17, 2006 9:24 AM)              Is Patient Diabetic? Yes  Nutritional Status Normal  Have you ever been in a relationship where you felt threatened, hurt or afraid?No   Does patient need assistance? Functional Status Self care Ambulation Normal   Chief Complaint:  pap smear.  History of Present Illness: This is a 55 year old woman with a past medical history of Diabetes mellitus, type II Hypertension Headache Tobacco use Hyperlipidemia Allergic rhinitis Hx leg cramps on Lipitor GERD Uterine fibroids Hx chest pain : exercise stress test negative 06-07 Perimenopausal Insomnia External hemorrhoids   who comes in today just for a PAP smear.  Her last regular period was approximately one year ago, and she has had spotting on and off since then, her last episode being one week ago.  She has no other problems.  She still smokes and would like to have medical help with this.  Prior Medications (reviewed today): HYDROCHLOROTHIAZIDE 25 MG TABS (HYDROCHLOROTHIAZIDE) Take 1 tablet by mouth once a day COZAAR 100 MG TABS (LOSARTAN POTASSIUM) Take 1 tablet by mouth once a day ASPIRIN 81 MG TBEC (ASPIRIN) Take 1 tablet by mouth once a day ALLEGRA 180 MG TABS (FEXOFENADINE HCL) Take 1 tablet by mouth once a day NEXIUM 40 MG PACK (ESOMEPRAZOLE MAGNESIUM) Take 1 tablet by mouth once a day METFORMIN HCL 500 MG TABS (METFORMIN HCL) Take 1 tablet by mouth two times a day Current Allergies (reviewed today): ! PCN ! CODEINE ! VICODIN    Risk Factors:  Tobacco use:  current    Cigarettes:  Yes -- 1/2 pack(s) per day    Counseled to quit/cut down tobacco use:  yes  Mammogram  History:    Date of Last Mammogram:  01/02/2006  PAP Smear History:    Date of Last PAP Smear:  01/07/2004    Physical Exam  General:     alert, well-developed, and well-nourished.  Smells of cigarettes. Lungs:     normal respiratory effort and normal breath sounds.   Heart:     normal rate, regular rhythm, no murmur, no gallop, and no rub.   Abdomen:     soft, non-tender, and normal bowel sounds.   Genitalia:     Pelvic Exam:        External: normal female genitalia without lesions or masses        Vagina: normal without lesions or masses        Cervix: normal without lesions or masses.                     Lying very far down in the pelvis, and                    slightly to the right.        Adnexa: normal bimanual exam without masses or fullness        Uterus: normal by palpation        Pap smear: performed    Impression & Recommendations:  Problem # 1:  Screening Cervical Cancer (ICD-V76.2) PAP smear performed.  Will call her with abnormal results.  Problem # 2:  SMOKER (ICD-305.1) Recommendations made today.  Start Nicotine patch and Chantix. Her updated medication list for this problem includes:    Nicoderm Cq 14 Mg/24hr Pt24 (Nicotine) .Marland Kitchen... Apply one patch daily    Chantix Starting Month Pak 0.5 Mg X 11 & 1 Mg X 42 Misc (Varenicline tartrate) .Marland Kitchen... Take as directed    Chantix Continuing Month Pak 1 Mg Tabs (Varenicline tartrate) .Marland Kitchen... Take 1 tablet by mouth two times a day   Medications Added to Medication List This Visit: 1)  Nicoderm Cq 14 Mg/24hr Pt24 (Nicotine) .... Apply one patch daily 2)  Chantix Starting Month Pak 0.5 Mg X 11 & 1 Mg X 42 Misc (Varenicline tartrate) .... Take as directed 3)  Chantix Continuing Month Pak 1 Mg Tabs (Varenicline tartrate) .... Take 1 tablet by mouth two times a day  Other Orders: T-Pap Smear, Thin Prep ([id number])   Patient Instructions: 1)  Please schedule a follow-up appointment in 6 months. 2)  Tobacco is very bad for  your health and your loved ones! You Should stop smoking!. 3)  Stop Smoking Tips: Choose a Quit date. Cut down before the Quit date. decide what you will do as a substitute when you feel the urge to smoke(gum,toothpick,exercise). 4)  Take the nicotine patch and Chantix as directed to help with your smoking cessation. 5)  If you need help, call me, or call the Chantix GETQUIT help line.

## 2010-11-09 NOTE — Assessment & Plan Note (Signed)
Summary: KNOT ON BACK OF NECK/SB.   Vital Signs:  Patient profile:   55 year old female Height:      65 inches (165.10 cm) Weight:      184.0 pounds (83.64 kg) BMI:     30.73 Temp:     97.0 degrees F (36.11 degrees C) oral Pulse rate:   96 / minute BP sitting:   153 / 92  (left arm)  Vitals Entered By: Stanton Kidney Ditzler RN (September 20, 2010 9:23 AM) Is Patient Diabetic? Yes Did you bring your meter with you today? No Pain Assessment Patient in pain? yes     Location: neck and buttock Intensity: 10 Type: sharp Onset of pain  since laser 09/15/10 Nutritional Status BMI of > 30 = obese Nutritional Status Detail appetite down CBG Result 155  Have you ever been in a relationship where you felt threatened, hurt or afraid?denies   Does patient need assistance? Functional Status Self care Ambulation Normal Comments Sharp pain in neck and buttock since 09/15/10.   Diabetic Foot Exam Last Podiatry Exam Date: 08/28/2010 Foot Inspection Is there a history of a foot ulcer?              No Is there a foot ulcer now?              No Can the patient see the bottom of their feet?          No Are the shoes appropriate in style and fit?          No Is there swelling or an abnormal foot shape?          No Are the toenails long?                No Are the toenails thick?                No Are the toenails ingrown?              No Is there heavy callous build-up?              Yes Is there pain in the calf muscle (Intermittent claudication) when walking?    NoIs there a claw toe deformity?              No Is there elevated skin temperature?            No Is there limited ankle dorsiflexion?            No Is there foot or ankle muscle weakness?            No  Diabetic Foot Care Education Pulse Check          Right Foot          Left Foot Posterior Tibial:        normal            normal Dorsalis Pedis:        normal            normal  High Risk Feet? No   Primary Care Provider:  Blondell Reveal MD   History of Present Illness: 55 yo woman with PMH of HTN, DM type II, cervical spondylosis presents today for a knot on her neck and pain on buttock.  She reports not knowing how long the knot on neck has been there but it started hurting (sharp pain) since Saturday night.  Denies any injury.  She went to have nerve  conduction test last Friday.  She is seeing sport medicine for her fingers which might have nerve entrapment. No other complaints.   Depression History:      The patient denies a depressed mood most of the day and a diminished interest in her usual daily activities.         Preventive Screening-Counseling & Management  Alcohol-Tobacco     Alcohol drinks/day: 0     Smoking Status: current     Smoking Cessation Counseling: yes     Packs/Day: 1/2 ppd     Year Started: at the age of 55     Passive Smoke Exposure: yes  Caffeine-Diet-Exercise     Does Patient Exercise: no     Type of exercise: walkiing     Times/week: 2  Allergies: 1)  ! Codeine 2)  ! Vicodin 3)  ! Doxycycline Hyclate (Doxycycline Hyclate) 4)  Penicillin V Potassium (Penicillin V Potassium)  Review of Systems  The patient denies anorexia, fever, weight loss, weight gain, vision loss, decreased hearing, hoarseness, chest pain, syncope, dyspnea on exertion, peripheral edema, prolonged cough, headaches, hemoptysis, abdominal pain, melena, hematochezia, severe indigestion/heartburn, hematuria, incontinence, genital sores, muscle weakness, suspicious skin lesions, transient blindness, difficulty walking, depression, unusual weight change, abnormal bleeding, enlarged lymph nodes, angioedema, breast masses, and testicular masses.    Physical Exam  General:  alert, well-developed, well-nourished, and well-hydrated.   Ears:  normal TM bilaterally.  External canal slightly erythematous on left.   Mouth:  pharynx pink and moist.   Lungs:  normal respiratory effort, no intercostal retractions, no  accessory muscle use, normal breath sounds, no crackles, and no wheezes.   Heart:  normal rate, regular rhythm, no murmur, no gallop, no rub, and no JVD.   Abdomen:  soft, non-tender, normal bowel sounds, no distention, no masses, and no rebound tenderness.   Neurologic:  alert & oriented X3, cranial nerves II-XII intact, strength normal in all extremities, and sensation intact to light touch.   Skin:  2x2 cm lipoma, nontender, non erythematous    Impression & Recommendations:  Problem # 1:  LIPOMA, SKIN (ICD-214.1) 2 x 2 cm lipoma on posterior neck at C 5-6 area.  Non erythema, non-tender.  Benign skin lesion, we will continue to monitor.  Does not need any intervention at this time.    Problem # 2:  DIABETES MELLITUS, TYPE II (ICD-250.00) Well controlled.  CBG today 155 and HbA1c on 07/2010 was 6.2.  Patient agreed for me to perform foot exam today.  She states that she normally does not like other people checking her feet.   Will continue current regimen.  Her updated medication list for this problem includes:    Hyzaar 100-25 Mg Tabs (Losartan potassium-hctz) .Marland Kitchen... Take 1 tablet by mouth once a day    Aspirin 81 Mg Tbec (Aspirin) .Marland Kitchen... Take 1 tablet by mouth once a day    Janumet 50-500 Mg Tabs (Sitagliptin-metformin hcl) .Marland Kitchen... Take 1 tablet by mouth two times a day  Orders: Capillary Blood Glucose/CBG (98119)  Problem # 3:  HYPERTENSION (ICD-401.9) Not well-controlled. BP today is 153/92.  She states that she does not take her BP med in the morning.  Question of non-compliant; therefore, I will not change or increase her BP meds today.  She will follow up with PCP in 1-2 months.  Her updated medication list for this problem includes:    Hyzaar 100-25 Mg Tabs (Losartan potassium-hctz) .Marland Kitchen... Take 1 tablet by mouth once a day  Problem # 4:  CIGARETTE SMOKER (ICD-305.1) She states that she is still smoking about 5-7 cigarettes per day; however, some days she does not smoke at all.  I  counselled her to quit because of all the risks and the irritation which causes her to continue to cough.  Complete Medication List: 1)  Hyzaar 100-25 Mg Tabs (Losartan potassium-hctz) .... Take 1 tablet by mouth once a day 2)  Aspirin 81 Mg Tbec (Aspirin) .... Take 1 tablet by mouth once a day 3)  Janumet 50-500 Mg Tabs (Sitagliptin-metformin hcl) .... Take 1 tablet by mouth two times a day 4)  Freestyle Lite Test Strp (Glucose blood) .... Use to test blood glucsoe once time a day 5)  Lancets Misc (Lancets) .... Use to test blood glucose one time a day 6)  Freestyle Control Solution Liqd (Blood glucose calibration) .... Use to calibrate meter and assure quality readings. 7)  Prodigy No Coding Blood Gluc Strp (Glucose blood) .... Use to check your sugar 1-2 times a day 8)  Prodigy Blood Glucose Monitor Devi (Blood glucose monitoring suppl) .... Use to check your blood sugar 9)  Cortisporin 3.5-10000-1 Soln (Neomycin-polymyxin-hc) .... 4 drops three times a day into affected ear. 10)  Pravachol 40 Mg Tabs (Pravastatin sodium) .... Take 2 tablets by mouth once a day at bedtime 11)  Tylenol Extra Strength 500 Mg Tabs (Acetaminophen) .... Take 2 tabs by mouth ever 8 hours as needed for pain. 12)  Amitriptyline Hcl 50 Mg Tabs (Amitriptyline hcl) .... Take 1 tab by mouth at bedtime 13)  Neurontin 300 Mg Caps (Gabapentin) .... Take 1 tablet by mouth at bedtime day 1, then take 1 tablet by mouth two times a day on day 2, then three times a day 14)  Ibuprofen 400 Mg Tabs (Ibuprofen) .... Take one (1) tablet by mouth three (3) times a day with food as needed for pain 15)  Vicodin 5-500 Mg Tabs (Hydrocodone-acetaminophen) .... Take one tablet every six hours as needed for pain  Patient Instructions: 1)  Restart taking Pravachol one tablet daily for your cholesterol 2)  Bump on your back is benign, it is called a lipoma 3)  Take all your medications as prescribed 4)  Follow up with your PCP in 1-2 months  to discuss possibility of sending you to a orthopedic surgeon after we get your nerve conduction test back (for your fingers)   Orders Added: 1)  Capillary Blood Glucose/CBG [82948] 2)  Est. Patient Level IV [98119]     Prevention & Chronic Care Immunizations   Influenza vaccine: Not documented   Influenza vaccine deferral: Refused  (09/20/2010)    Tetanus booster: 05/18/2009: Td   Td booster deferral: Not indicated  (05/01/2010)    Pneumococcal vaccine: Not documented   Pneumococcal vaccine deferral: Not indicated  (05/01/2010)  Colorectal Screening   Hemoccult: Negative  (12/25/2005)   Hemoccult action/deferral: Not indicated  (05/01/2010)    Colonoscopy: One 4 mm polyp in the proximal ascending colon, resected and retrieved.  Two 3-5 mm polyps in the proximal ascending colon, resected and retrieved. Pathology results pending. Exam done by Dr.Vincent Schooler.  (03/22/2008)   Colonoscopy action/deferral: Not indicated  (05/01/2010)   Colonoscopy due: 03/23/2011  Other Screening   Pap smear: NEGATIVE FOR INTRAEPITHELIAL LESIONS OR MALIGNANCY.  (02/28/2009)   Pap smear action/deferral: Not indicated-other  (05/01/2010)   Pap smear due: 02/29/2012    Mammogram: No specific mammographic evidence of malignancy.  Assessment: BIRADS 1.   (  03/04/2008)   Mammogram action/deferral: Refused  (09/20/2010)   Mammogram due: 03/2009   Smoking status: current  (09/20/2010)   Smoking cessation counseling: yes  (09/20/2010)  Diabetes Mellitus   HgbA1C: 6.2  (07/28/2010)   HgbA1C action/deferral: Ordered  (05/01/2010)    Eye exam: No diabetic retinopathy.     (02/08/2009)   Diabetic eye exam action/deferral: Ophthalmology referral  (08/11/2010)   Eye exam due: 02/08/2010    Foot exam: yes  (05/01/2010)   Foot exam action/deferral: Do today   High risk foot: No  (09/20/2010)   Foot care education: Done  (05/01/2010)    Urine microalbumin/creatinine ratio: 6.8   (06/28/2008)   Urine microalbumin action/deferral: Deferred   Urine microalbumin/cr due: 06/28/2009    Diabetes flowsheet reviewed?: Yes   Progress toward A1C goal: At goal  Lipids   Total Cholesterol: 236  (11/17/2009)   Lipid panel action/deferral: Lipid Panel ordered   LDL: 129  (11/17/2009)   LDL Direct: Not documented   HDL: 50  (11/17/2009)   Triglycerides: 284  (11/17/2009)   Lipid panel due: 01/05/2010    SGOT (AST): 17  (11/17/2009)   BMP action: Ordered   SGPT (ALT): 19  (11/17/2009)   Alkaline phosphatase: 70  (11/17/2009)   Total bilirubin: 0.3  (11/17/2009)  Hypertension   Last Blood Pressure: 153 / 92  (09/20/2010)   Serum creatinine: 0.77  (11/17/2009)   BMP action: Ordered   Serum potassium 4.2  (11/17/2009)   Basic metabolic panel due: 01/05/2010  Self-Management Support :   Personal Goals (by the next clinic visit) :     Personal A1C goal: 6  (11/16/2009)     Personal blood pressure goal: 130/80  (05/11/2009)     Personal LDL goal: 70  (11/16/2009)    Patient will work on the following items until the next clinic visit to reach self-care goals:     Medications and monitoring: take my medicines every day, check my blood sugar, bring all of my medications to every visit, examine my feet every day  (09/20/2010)     Eating: drink diet soda or water instead of juice or soda, eat more vegetables, use fresh or frozen vegetables, eat fruit for snacks and desserts, limit or avoid alcohol  (09/20/2010)     Activity: take a 30 minute walk every day  (09/20/2010)     Other: walk every night  (05/01/2010)     Home glucose monitoring frequency: 1 time daily  (11/16/2009)    Diabetes self-management support: Written self-care plan, Education handout, Resources for patients handout  (09/20/2010)   Diabetes care plan printed   Diabetes education handout printed   Last diabetes self-management training by diabetes educator: 01/05/2009    Hypertension self-management  support: Written self-care plan, Education handout, Resources for patients handout  (09/20/2010)   Hypertension self-care plan printed.   Hypertension education handout printed    Lipid self-management support: Written self-care plan, Education handout, Resources for patients handout  (09/20/2010)   Lipid self-care plan printed.   Lipid education handout printed      Resource handout printed.   Nursing Instructions: Diabetic foot exam today    Last LDL:                                                 129 (11/17/2009 12:03:00 AM)  Diabetic Foot Exam Pulse Check          Right Foot          Left Foot Posterior Tibial:        normal            normal Dorsalis Pedis:        normal            normal Comments: Pt refuses foot exam - Dr Anselm Jungling aware. Debra Ditzler RN High Risk Feet? No

## 2010-11-09 NOTE — Consult Note (Signed)
Summary: [facility]   Imported By: Margie Billet 10/18/2010 11:50:49  _____________________________________________________________________  External Attachment:    Type:   Image     Comment:   External Document  Appended Document: [facility]  Called patient and informed about the results. Patient already a call from neuro and has a f/u appt on this friday. Recommended not to miss the appt.

## 2010-11-09 NOTE — Consult Note (Signed)
Summary: [facility]   Imported By: Margie Billet 10/27/2010 10:44:26  _____________________________________________________________________  External Attachment:    Type:   Image     Comment:   External Document

## 2010-11-09 NOTE — Consult Note (Signed)
Summary: [facility]   Imported By: Margie Billet 10/24/2010 11:20:00  _____________________________________________________________________  External Attachment:    Type:   Image     Comment:   External Document

## 2010-11-10 NOTE — Letter (Signed)
Summary: [facility]  [facility]   Imported By: Florinda Marker 02/03/2009 16:59:46  _____________________________________________________________________  External Attachment:    Type:   Image     Comment:   External Document

## 2010-11-20 ENCOUNTER — Ambulatory Visit: Payer: Self-pay | Admitting: Internal Medicine

## 2010-11-27 ENCOUNTER — Ambulatory Visit: Payer: Self-pay | Admitting: Internal Medicine

## 2010-11-28 ENCOUNTER — Ambulatory Visit (INDEPENDENT_AMBULATORY_CARE_PROVIDER_SITE_OTHER): Payer: Medicaid Other | Admitting: Internal Medicine

## 2010-11-28 ENCOUNTER — Encounter: Payer: Self-pay | Admitting: Internal Medicine

## 2010-11-28 ENCOUNTER — Other Ambulatory Visit: Payer: Self-pay | Admitting: Internal Medicine

## 2010-11-28 VITALS — BP 149/93 | HR 91 | Temp 96.9°F | Ht 65.0 in | Wt 182.6 lb

## 2010-11-28 DIAGNOSIS — N898 Other specified noninflammatory disorders of vagina: Secondary | ICD-10-CM

## 2010-11-28 DIAGNOSIS — B373 Candidiasis of vulva and vagina: Secondary | ICD-10-CM

## 2010-11-28 DIAGNOSIS — E119 Type 2 diabetes mellitus without complications: Secondary | ICD-10-CM

## 2010-11-28 LAB — POCT GLYCOSYLATED HEMOGLOBIN (HGB A1C): Hemoglobin A1C: 6.3

## 2010-11-28 LAB — GLUCOSE, CAPILLARY: Glucose-Capillary: 167 mg/dL — ABNORMAL HIGH (ref 70–99)

## 2010-11-28 MED ORDER — FLUCONAZOLE 100 MG PO TABS: 150.0000 mg | ORAL_TABLET | Freq: Every day | ORAL | Status: AC

## 2010-11-28 MED ORDER — FLUCONAZOLE 100 MG PO TABS: 150.0000 mg | ORAL_TABLET | Freq: Every day | ORAL | Status: DC

## 2010-11-28 NOTE — Progress Notes (Signed)
  Subjective:    Patient ID: Bridget Owens, female    DOB: November 28, 1955, 55 y.o.   MRN: [id number]  HPI Here with c/o vaginal discharge, white/thick with associated pruritis. Self-obtained wet prep pending  Her medications are a jumble-she is marginaly literate and does not have her pills with her. THINKS she goes to [facility]-advised on imp of bringing her meds every visit.   Review of Systems As above  Aic=6.3    Objective:   Physical Exam     Wet prep pending   Assessment & Plan:   Vaginal Candidiasis, probable  Empiric rx with diflucan 150 x 1 dose   FU ine month  WITH all medications   Smoking cessation advised

## 2010-11-29 LAB — C. TRACHOMATIS / N. GONORRHOEAE, DNA PROBE
Candida species: NEGATIVE
Gardnerella vaginalis: POSITIVE — AB
Trichomonas vaginosis: NEGATIVE

## 2010-11-29 LAB — WET PREP BY MOLECULAR PROBE
Candida species: NEGATIVE
Gardnerella vaginalis: POSITIVE — AB
Trichomonas vaginosis: NEGATIVE

## 2010-12-01 ENCOUNTER — Other Ambulatory Visit: Payer: Self-pay | Admitting: Internal Medicine

## 2010-12-19 LAB — GLUCOSE, CAPILLARY: Glucose-Capillary: 155 mg/dL — ABNORMAL HIGH (ref 70–99)

## 2010-12-20 LAB — GLUCOSE, CAPILLARY
Glucose-Capillary: 122 mg/dL — ABNORMAL HIGH (ref 70–99)
Glucose-Capillary: 149 mg/dL — ABNORMAL HIGH (ref 70–99)
Glucose-Capillary: 183 mg/dL — ABNORMAL HIGH (ref 70–99)

## 2010-12-21 ENCOUNTER — Ambulatory Visit: Payer: Medicaid Other | Admitting: Internal Medicine

## 2010-12-22 LAB — GLUCOSE, CAPILLARY
Glucose-Capillary: 183 mg/dL — ABNORMAL HIGH (ref 70–99)
Glucose-Capillary: 119 mg/dL — ABNORMAL HIGH (ref 70–99)

## 2010-12-23 LAB — GLUCOSE, CAPILLARY: Glucose-Capillary: 82 mg/dL (ref 70–99)

## 2010-12-26 LAB — GLUCOSE, CAPILLARY: Glucose-Capillary: 109 mg/dL — ABNORMAL HIGH (ref 70–99)

## 2010-12-29 LAB — GLUCOSE, CAPILLARY: Glucose-Capillary: 179 mg/dL — ABNORMAL HIGH (ref 70–99)

## 2011-01-11 LAB — CBC
HCT: 40.4 % (ref 36.0–46.0)
Hemoglobin: 13.6 g/dL (ref 12.0–15.0)
MCHC: 33.8 g/dL (ref 30.0–36.0)
MCV: 89.5 fL (ref 78.0–100.0)
Platelets: 273 10*3/uL (ref 150–400)
RBC: 4.51 MIL/uL (ref 3.87–5.11)
RDW: 13.2 % (ref 11.5–15.5)
WBC: 8.2 10*3/uL (ref 4.0–10.5)

## 2011-01-11 LAB — DIFFERENTIAL
Basophils Absolute: 0 10*3/uL (ref 0.0–0.1)
Basophils Relative: 1 % (ref 0–1)
Eosinophils Absolute: 0.1 10*3/uL (ref 0.0–0.7)
Eosinophils Relative: 1 % (ref 0–5)
Lymphocytes Relative: 33 % (ref 12–46)
Lymphs Abs: 2.7 10*3/uL (ref 0.7–4.0)
Monocytes Absolute: 0.5 10*3/uL (ref 0.1–1.0)
Monocytes Relative: 6 % (ref 3–12)
Neutro Abs: 4.9 10*3/uL (ref 1.7–7.7)
Neutrophils Relative %: 59 % (ref 43–77)

## 2011-01-11 LAB — BASIC METABOLIC PANEL
BUN: 13 mg/dL (ref 6–23)
CO2: 28 mEq/L (ref 19–32)
Calcium: 9.6 mg/dL (ref 8.4–10.5)
Chloride: 99 mEq/L (ref 96–112)
Creatinine, Ser: 0.95 mg/dL (ref 0.4–1.2)
GFR calc Af Amer: >60 mL/min (ref >60)
GFR calc non Af Amer: >60 mL/min (ref >60)
Glucose, Bld: 91 mg/dL (ref 70–99)
Potassium: 4.2 mEq/L (ref 3.5–5.1)
Sodium: 138 mEq/L (ref 135–145)

## 2011-01-11 LAB — POCT CARDIAC MARKERS
CKMB, poc: 1.6 ng/mL (ref 1.0–8.0)
Myoglobin, poc: 76.8 ng/mL (ref 12–200)
Troponin i, poc: <0.05 ng/mL (ref 0.00–0.09)

## 2011-01-11 LAB — PROTIME-INR
INR: 0.9 (ref 0.00–1.49)
Prothrombin Time: 12.5 seconds (ref 11.6–15.2)

## 2011-01-12 LAB — GLUCOSE, CAPILLARY: Glucose-Capillary: 118 mg/dL — ABNORMAL HIGH (ref 70–99)

## 2011-01-14 LAB — GLUCOSE, CAPILLARY: Glucose-Capillary: 147 mg/dL — ABNORMAL HIGH (ref 70–99)

## 2011-01-15 LAB — GLUCOSE, CAPILLARY: Glucose-Capillary: 139 mg/dL — ABNORMAL HIGH (ref 70–99)

## 2011-01-16 LAB — GLUCOSE, CAPILLARY: Glucose-Capillary: 153 mg/dL — ABNORMAL HIGH (ref 70–99)

## 2011-01-17 LAB — GLUCOSE, CAPILLARY: Glucose-Capillary: 129 mg/dL — ABNORMAL HIGH (ref 70–99)

## 2011-01-18 LAB — GLUCOSE, CAPILLARY: Glucose-Capillary: 144 mg/dL — ABNORMAL HIGH (ref 70–99)

## 2011-01-22 LAB — GLUCOSE, CAPILLARY
Glucose-Capillary: 167 mg/dL — ABNORMAL HIGH (ref 70–99)
Glucose-Capillary: 186 mg/dL — ABNORMAL HIGH (ref 70–99)

## 2011-01-24 ENCOUNTER — Ambulatory Visit: Payer: Medicaid Other | Admitting: Internal Medicine

## 2011-01-30 ENCOUNTER — Encounter: Payer: Self-pay | Admitting: Ophthalmology

## 2011-02-21 ENCOUNTER — Ambulatory Visit (INDEPENDENT_AMBULATORY_CARE_PROVIDER_SITE_OTHER): Payer: Medicaid Other | Admitting: Internal Medicine

## 2011-02-21 ENCOUNTER — Encounter: Payer: Self-pay | Admitting: Internal Medicine

## 2011-02-21 VITALS — BP 159/96 | HR 72 | Temp 97.8°F | Ht 65.0 in | Wt 186.4 lb

## 2011-02-21 DIAGNOSIS — R232 Flushing: Secondary | ICD-10-CM

## 2011-02-21 DIAGNOSIS — H6690 Otitis media, unspecified, unspecified ear: Secondary | ICD-10-CM

## 2011-02-21 DIAGNOSIS — E119 Type 2 diabetes mellitus without complications: Secondary | ICD-10-CM

## 2011-02-21 DIAGNOSIS — N951 Menopausal and female climacteric states: Secondary | ICD-10-CM

## 2011-02-21 DIAGNOSIS — H9209 Otalgia, unspecified ear: Secondary | ICD-10-CM

## 2011-02-21 LAB — GLUCOSE, CAPILLARY: Glucose-Capillary: 110 mg/dL — ABNORMAL HIGH (ref 70–99)

## 2011-02-21 LAB — POCT GLYCOSYLATED HEMOGLOBIN (HGB A1C): Hemoglobin A1C: 6.6

## 2011-02-21 MED ORDER — IBUPROFEN 400 MG PO TABS: 400.0000 mg | ORAL_TABLET | Freq: Three times a day (TID) | ORAL | Status: DC

## 2011-02-21 MED ORDER — CEFUROXIME AXETIL 250 MG PO TABS: 250.0000 mg | ORAL_TABLET | Freq: Two times a day (BID) | ORAL | Status: AC

## 2011-02-21 MED ORDER — BENZOCAINE-ANTIPYRINE 1.4-5.4 % OT SOLN: 3.0000 [drp] | Freq: Four times a day (QID) | OTIC | Status: AC | PRN

## 2011-02-21 NOTE — Assessment & Plan Note (Signed)
Hemoglobin A1c of 6.6, we'll continue current medication and not make any changes as of now.

## 2011-02-21 NOTE — Progress Notes (Signed)
  Subjective:    Patient ID: Bridget Owens, female    DOB: [birth year], 55 y.o.   MRN: [id number]  HPI Bridget Owens is a pleasant 55 year old woman with past medical history of DM type II HbA1c 6.6 today , the clinic for right ear pain for about a week. She had this kind of pain before. This time the pain started while she was watching TV suddenly and was throbbing, 10 out of 10 at its worse, continuous. Denies any hearing loss or changes, vision changes, sore throat, cough, fever, chills, discharge from ear, left ear pain.     Review of Systems    as per history of present illness Objective:   Physical Exam    Constitutional: Vital signs reviewed.  Patient is a well-developed and well-nourished in no acute distress and cooperative with exam. Alert and oriented x3.  Head: Normocephalic and atraumatic Ear: Right ear-external canal normal, tympanic membrane has normal reflex, yellowish purulent but she is seen behind the tympanic membrane and whitish plaque on the upper part the tympanic membrane. Also yellowish purulent material seen on the left ear exam similar to the right. Mouth: no erythema or exudates, MMM Eyes: PERRL, EOMI, conjunctivae normal, No scleral icterus.  Neck: Supple, Trachea midline normal ROM, No JVD, mass, thyromegaly, or carotid bruit present.  Cardiovascular: RRR, S1 normal, S2 normal, no MRG, pulses symmetric and intact bilaterally Pulmonary/Chest: CTAB, no wheezes, rales, or rhonchi Abdominal: Soft. Non-tender, non-distended, bowel sounds are normal, no masses, organomegaly, or guarding present.  GU: no CVA tenderness Musculoskeletal: No joint deformities, erythema, or stiffness, ROM full and no nontender Neurological: A&O x3, Strenght is normal and symmetric bilaterally, cranial nerve II-XII are grossly intact, no focal motor deficit, sensory intact to light touch bilaterally.  Skin: Warm, dry and intact. No rash, cyanosis, or clubbing.       Assessment & Plan:

## 2011-02-21 NOTE — Assessment & Plan Note (Signed)
Bridget Owens is likely having acute otitis media and so will treat with Cefuroxime 250 mg by mouth twice a day for 10 days. - We'll also give her A/B otic drops for numbing as needed along with ibuprofen 3 times daily for pain and inflammation. - We'll see her back in 10-14 days.

## 2011-02-21 NOTE — Patient Instructions (Signed)
Please make a followup appointment in 10-14 days. Please start taking the antibiotic-cefuroxime 250 mg 2 times daily for next 10 days that his total of 20 doses. Also use the ear drops, 3 drops, which I will prescribe to you as the was 6 hours as needed, that will help your pain. Also start taking ibuprofen 400 mg 3 times a day with meals for next 2 weeks. Call the clinic if something else comes up before the followup appointment.

## 2011-02-21 NOTE — Assessment & Plan Note (Signed)
Considering her severe postmenopausal symptoms in terms of hot flashes which is interfering with her sleep for long time, will transfer her to OB/GYN for further management.

## 2011-02-23 NOTE — Group Therapy Note (Signed)
NAME:  Bridget Owens, Bridget Owens                           ACCOUNT NO.:  [id number]   MEDICAL RECORD NO.:  [id number]                   PATIENT TYPE:  WOC   LOCATION:  [facility]                           FACILITY:  [facility]   PHYSICIAN:  Argentina Donovan, MD                     DATE OF BIRTH:  [birth year]   DATE OF SERVICE:  06/08/2004                                    CLINIC NOTE   CHIEF COMPLAINT:  Vaginal bleeding.   SUBJECTIVE:  This is a 55 year old African-American female gravida 8 para 3-  0-5-3 who comes in for postmenopausal bleeding.  Her last menstrual period  was in March, lasting 2 days.  An ultrasound done at [facility] showed a 13 cm  uterus which was irregular and with presence of fibroids.  The patient is  also a known type 2 diabetic and is supposed to be on metformin.  She also  has the hypertension for which she takes Norvasc and hydrochlorothiazide.  She continues to smoke a half a pack of cigarettes per day.  The patient  developed vaginal bleeding approximately lasting 10 days.  She consulted at  [facility] and was referred to the GYN clinic for further evaluation.   OBJECTIVE:  VITAL SIGNS:  Blood pressure 136/86.  ABDOMEN:  Soft, flat, nontender.  No masses, no tenderness.  PELVIC:  The uterus is approximately 12 weeks in size, irregular, doughy to  firm with no note of any adnexal masses or tenderness.  Normal external  genitalia.  Smooth, parous vagina.  No vaginal discharge or bleeding.  Cervix smooth, firm, closed, nontender.   PROCEDURE DONE:  Endometrial biopsy.  Anterior aspect of the cervix grasped  with tenaculum forceps.  Uterine sounding done with note of uterine depth of  about 7 cm.  Proceeded with suction endometrial biopsy.  Tenaculum forceps  removed.   IMPRESSION:  Postmenopausal bleeding.  Endometrial biopsy done to determine  if there is endometrial pathology which can cause the bleeding.  It is  possible that bleeding can be secondary to the uterine fibroids,  especially  if it is submucosal in location.  However, since the patient is in the  menopausal period we can continue to observe for regression of the fibroids.                                               Argentina Donovan, MD    PR/MEDQ  D:  06/08/2004  T:  06/10/2004  Job:  [id number]

## 2011-02-23 NOTE — Group Therapy Note (Signed)
NAME:  RAYE, WIENS                           ACCOUNT NO.:  [id number]   MEDICAL RECORD NO.:  [id number]                   PATIENT TYPE:  OUT   LOCATION:  [facility]                           FACILITY:  [facility]   PHYSICIAN:  Ellis Parents, MD                 DATE OF BIRTH:  May 20, 1956   DATE OF SERVICE:  01/07/2004                                    CLINIC NOTE   REASON FOR VISIT:  This 55 year old multiparous female comes in for routine  annual pelvic exam and Pap smear.  Her last menstrual period was March 5  lasting 2 days.  Her menstrual periods have been scanty over the past year  but basically regular.  She complains of some moderate hot flashes.   PHYSICAL EXAMINATION:  The vagina is well supported and clean.  The cervix  looks well epithelialized.  The uterus is midposition and appears about 10-  to 12-weeks size with some fibroids present.  Both adnexa are soft without  any induration, nodularity, or masses.   Pap smear is taken.  Mammogram is ordered.  The patient will return in 1  year.                                               Ellis Parents, MD    SA/MEDQ  D:  01/07/2004  T:  01/07/2004  Job:  [id number]

## 2011-03-24 ENCOUNTER — Emergency Department (HOSPITAL_COMMUNITY): Payer: Medicaid Other

## 2011-03-24 ENCOUNTER — Emergency Department (HOSPITAL_COMMUNITY)
Admission: EM | Admit: 2011-03-24 | Discharge: 2011-03-25 | Disposition: A | Discharge status: Home / Self Care | Payer: Medicaid Other | Attending: Emergency Medicine | Admitting: Emergency Medicine

## 2011-03-24 DIAGNOSIS — E119 Type 2 diabetes mellitus without complications: Secondary | ICD-10-CM | POA: Insufficient documentation

## 2011-03-24 DIAGNOSIS — R05 Cough: Secondary | ICD-10-CM | POA: Insufficient documentation

## 2011-03-24 DIAGNOSIS — J40 Bronchitis, not specified as acute or chronic: Secondary | ICD-10-CM | POA: Insufficient documentation

## 2011-03-24 DIAGNOSIS — I10 Essential (primary) hypertension: Secondary | ICD-10-CM | POA: Insufficient documentation

## 2011-03-24 DIAGNOSIS — R079 Chest pain, unspecified: Secondary | ICD-10-CM | POA: Insufficient documentation

## 2011-03-24 DIAGNOSIS — Z79899 Other long term (current) drug therapy: Secondary | ICD-10-CM | POA: Insufficient documentation

## 2011-03-24 DIAGNOSIS — R059 Cough, unspecified: Secondary | ICD-10-CM | POA: Insufficient documentation

## 2011-03-25 LAB — CBC
HCT: 37.7 % (ref 36.0–46.0)
Hemoglobin: 12.8 g/dL (ref 12.0–15.0)
MCH: 29.5 pg (ref 26.0–34.0)
MCHC: 34 g/dL (ref 30.0–36.0)
MCV: 86.9 fL (ref 78.0–100.0)
Platelets: 283 10*3/uL (ref 150–400)
RBC: 4.34 MIL/uL (ref 3.87–5.11)
RDW: 13.1 % (ref 11.5–15.5)
WBC: 9.2 10*3/uL (ref 4.0–10.5)

## 2011-03-25 LAB — BASIC METABOLIC PANEL
BUN: 12 mg/dL (ref 6–23)
CO2: 28 mEq/L (ref 19–32)
Calcium: 9.5 mg/dL (ref 8.4–10.5)
Chloride: 102 mEq/L (ref 96–112)
Creatinine, Ser: 0.75 mg/dL (ref 0.50–1.10)
GFR calc Af Amer: >60 mL/min (ref >60)
GFR calc non Af Amer: >60 mL/min (ref >60)
Glucose, Bld: 147 mg/dL — ABNORMAL HIGH (ref 70–99)
Potassium: 3.8 mEq/L (ref 3.5–5.1)
Sodium: 140 mEq/L (ref 135–145)

## 2011-03-25 LAB — TROPONIN I: Troponin I: <0.3 ng/mL (ref <0.30)

## 2011-03-29 ENCOUNTER — Telehealth: Payer: Self-pay | Admitting: *Deleted

## 2011-03-29 NOTE — Telephone Encounter (Signed)
Pt states she got abx, doesn't know what kind, in ED for her ear. She got a rash "cause she does w/ some abx" and now it's swollen and itchy, i ask if this was everywhere, "nah, down there" after asking a few more questions it seems her vagina is swollen and itchy... Yeast??? But the rash is other places. She is given an appt for 6/22

## 2011-03-30 ENCOUNTER — Ambulatory Visit (HOSPITAL_COMMUNITY)
Admission: RE | Admit: 2011-03-30 | Discharge: 2011-03-30 | Disposition: A | Discharge status: Home / Self Care | Payer: Medicaid Other | Source: Ambulatory Visit | Attending: Internal Medicine | Admitting: Internal Medicine

## 2011-03-30 ENCOUNTER — Ambulatory Visit (INDEPENDENT_AMBULATORY_CARE_PROVIDER_SITE_OTHER): Payer: Medicaid Other | Admitting: Internal Medicine

## 2011-03-30 DIAGNOSIS — R05 Cough: Secondary | ICD-10-CM | POA: Insufficient documentation

## 2011-03-30 DIAGNOSIS — I10 Essential (primary) hypertension: Secondary | ICD-10-CM

## 2011-03-30 DIAGNOSIS — N898 Other specified noninflammatory disorders of vagina: Secondary | ICD-10-CM

## 2011-03-30 DIAGNOSIS — B3731 Acute candidiasis of vulva and vagina: Secondary | ICD-10-CM

## 2011-03-30 DIAGNOSIS — E119 Type 2 diabetes mellitus without complications: Secondary | ICD-10-CM

## 2011-03-30 DIAGNOSIS — G4700 Insomnia, unspecified: Secondary | ICD-10-CM

## 2011-03-30 DIAGNOSIS — R0602 Shortness of breath: Secondary | ICD-10-CM

## 2011-03-30 DIAGNOSIS — R058 Other specified cough: Secondary | ICD-10-CM

## 2011-03-30 DIAGNOSIS — B373 Candidiasis of vulva and vagina: Secondary | ICD-10-CM

## 2011-03-30 DIAGNOSIS — R059 Cough, unspecified: Secondary | ICD-10-CM | POA: Insufficient documentation

## 2011-03-30 DIAGNOSIS — R0600 Dyspnea, unspecified: Secondary | ICD-10-CM | POA: Insufficient documentation

## 2011-03-30 DIAGNOSIS — N390 Urinary tract infection, site not specified: Secondary | ICD-10-CM

## 2011-03-30 LAB — URINALYSIS, ROUTINE W REFLEX MICROSCOPIC
Bilirubin Urine: NEGATIVE
Glucose, UA: NEGATIVE mg/dL
Ketones, ur: NEGATIVE mg/dL
Nitrite: NEGATIVE
Protein, ur: NEGATIVE mg/dL
Specific Gravity, Urine: 1.006 (ref 1.005–1.030)
Urobilinogen, UA: 0.2 mg/dL (ref 0.0–1.0)
pH: 6 (ref 5.0–8.0)

## 2011-03-30 LAB — GLUCOSE, CAPILLARY: Glucose-Capillary: 119 mg/dL — ABNORMAL HIGH (ref 70–99)

## 2011-03-30 MED ORDER — HYZAAR 100-25 MG PO TABS: 1.0000 | ORAL_TABLET | Freq: Every day | ORAL | Status: DC

## 2011-03-30 MED ORDER — LEVOFLOXACIN 500 MG PO TABS: 500.0000 mg | ORAL_TABLET | Freq: Every day | ORAL | Status: DC

## 2011-03-30 MED ORDER — ALBUTEROL SULFATE HFA 108 (90 BASE) MCG/ACT IN AERS: 2.0000 | INHALATION_SPRAY | Freq: Four times a day (QID) | RESPIRATORY_TRACT | Status: DC | PRN

## 2011-03-30 MED ORDER — FLUCONAZOLE 100 MG PO TABS: 200.0000 mg | ORAL_TABLET | Freq: Every day | ORAL | Status: DC

## 2011-03-30 MED ORDER — ZOLPIDEM TARTRATE ER 12.5 MG PO TBCR: 12.5000 mg | EXTENDED_RELEASE_TABLET | Freq: Every evening | ORAL | Status: DC | PRN

## 2011-03-30 NOTE — Progress Notes (Signed)
Subjective:    Patient ID: Bridget Owens, female    DOB: 1956-09-01, 55 y.o.   MRN: [id number]  HPI  Ms. Dorff is a pleasant 55 year old woman with past medical history of DM type II HbA1c 6.6 today , She was in the clinic for right ear pain for about 6 week ago, She was given cefuroxime and ear drops to treat middle ear infection. Now she has following complains 1. She has been having cough and congestion for last week. She is unable to sleep. She has no fevers but cough is constant and persistent. She has sputum production which is yellow. She endorses wheezing. She is long time smoker but has stopped while being sick.  2. She has itching and burning in vaginal area, along with frequent urination. She says vaginal itching comes on every time she takes antibiotics. 3. She does not have money to pay for medicines. Especially cough syrup prescribed by ED (she went there one week ago), she had to pay out of the pocket and she cant pay for medicine any more     Review of Systems  Constitutional: Positive for appetite change. Negative for fever, chills and activity change.  HENT: Positive for congestion, rhinorrhea and voice change. Negative for nosebleeds, facial swelling, neck pain and tinnitus.   Eyes: Negative for pain, discharge and visual disturbance.  Respiratory: Positive for cough, chest tightness and shortness of breath.   Cardiovascular: Negative for chest pain and palpitations.  Gastrointestinal: Negative for nausea, vomiting, abdominal pain, blood in stool and abdominal distention.  Genitourinary: Positive for dysuria and frequency.  Skin: Negative for rash.  Neurological: Negative for dizziness, seizures, weakness and headaches.  Psychiatric/Behavioral: Negative for suicidal ideas, confusion and agitation.      Objective:   Physical Exam  Constitutional: She is oriented to person, place, and time. She appears well-developed and well-nourished.  HENT:  Head: Normocephalic and  atraumatic.  Right Ear: External ear normal.  Left Ear: External ear normal.  Mouth/Throat: Oropharyngeal exudate present.  Eyes: Conjunctivae and EOM are normal. Pupils are equal, round, and reactive to light.  Neck: No JVD present. No tracheal deviation present. No thyromegaly present.  Cardiovascular: Normal rate, regular rhythm and normal heart sounds.  Exam reveals no gallop.   No murmur heard. Pulmonary/Chest: No respiratory distress. She has wheezes. She has no rales. She exhibits no tenderness.  Abdominal: Soft. Bowel sounds are normal. She exhibits no distension and no mass. There is tenderness. There is no rebound and no guarding.       Supra pubic pain  Musculoskeletal: Normal range of motion. She exhibits no edema and no tenderness.  Lymphadenopathy:    She has no cervical adenopathy.  Neurological: She is alert and oriented to person, place, and time. She has normal reflexes. No cranial nerve deficit. Coordination normal.  Skin: No rash noted. No erythema.  Psychiatric: She has a normal mood and affect. Her behavior is normal. Thought content normal.      Constitutional: Vital signs reviewed.  Patient is a well-developed and well-nourished in no acute distress and cooperative with exam. Alert and oriented x3.  Head: Normocephalic and atraumatic Ear: Right ear-external canal normal, tympanic membrane has normal reflex, Mouth: no erythema or exudates, MMM Eyes: PERRL, EOMI, conjunctivae normal, No scleral icterus.  Neck: Supple, Trachea midline normal ROM, No JVD, mass, thyromegaly, or carotid bruit present.  Cardiovascular: RRR, S1 normal, S2 normal, no MRG, pulses symmetric and intact bilaterally Pulmonary/Chest: CTAB, no wheezes, rales,  or rhonchi Abdominal: Soft. Non-tender, non-distended, bowel sounds are normal, no masses, organomegaly, or guarding present.  GU: no CVA tenderness Musculoskeletal: No joint deformities, erythema, or stiffness, ROM full and no  nontender Neurological: A&O x3, Strenght is normal and symmetric bilaterally, cranial nerve II-XII are grossly intact, no focal motor deficit, sensory intact to light touch bilaterally.  Skin: Warm, dry and intact. No rash, cyanosis, or clubbing.       Assessment & Plan:

## 2011-03-30 NOTE — Assessment & Plan Note (Signed)
Related to antibiotic use. Treat X 1.

## 2011-03-30 NOTE — Assessment & Plan Note (Signed)
UTI per dispstick. Get UA and micro for confirmation. Will use levofloxacin and add in culture for further workup.

## 2011-03-30 NOTE — Assessment & Plan Note (Signed)
Started on lisinopril from ER. Pt did not know for sure if still taking hyzaar. Need to bring all the bottles on next visit. Refilled hyzaar for now.

## 2011-03-30 NOTE — Patient Instructions (Signed)
Return in 2 weeks

## 2011-03-30 NOTE — Assessment & Plan Note (Signed)
Likely related to infection. CXR pending. Will give albuterol to treat prn wheezing.

## 2011-03-30 NOTE — Assessment & Plan Note (Signed)
Cough and sputum production, progressive for one week. ? CAP vs bronchitis. Will treat with levofloxacin. Not related to lisinopril but could be getting worse with it. CXR pending. Can not prescribe cough syrup due to cost (medicare would not pay for it) and allergies.

## 2011-03-30 NOTE — Assessment & Plan Note (Signed)
A1C well controlled on janumet. Continue same meds for now.

## 2011-03-31 LAB — URINALYSIS, MICROSCOPIC ONLY
Bacteria, UA: NONE SEEN
Casts: NONE SEEN
Crystals: NONE SEEN
Squamous Epithelial / LPF: NONE SEEN

## 2011-04-01 LAB — URINE CULTURE: Colony Count: 3000

## 2011-04-02 ENCOUNTER — Telehealth: Payer: Self-pay | Admitting: *Deleted

## 2011-04-02 MED ORDER — LISINOPRIL 5 MG PO TABS: 5.0000 mg | ORAL_TABLET | Freq: Every day | ORAL | Status: DC

## 2011-04-02 MED ORDER — SULFAMETHOXAZOLE-TRIMETHOPRIM 800-160 MG PO TABS: 1.0000 | ORAL_TABLET | Freq: Two times a day (BID) | ORAL | Status: AC

## 2011-04-02 NOTE — Telephone Encounter (Signed)
Changed antibiotics. D/c Hyzaar. Continue lisinopril that was prescribed to her from ER

## 2011-04-02 NOTE — Telephone Encounter (Signed)
Pt states her insurance will not pay for the antibiotics for her chest. I called [facility]; the insurance will not cover Levaquin and Hyzaar. Thanks

## 2011-04-06 ENCOUNTER — Inpatient Hospital Stay (HOSPITAL_COMMUNITY)
Admission: AD | Admit: 2011-04-06 | Discharge: 2011-04-06 | Disposition: A | Discharge status: Home / Self Care | Payer: Medicaid Other | Source: Ambulatory Visit | Attending: Obstetrics & Gynecology | Admitting: Obstetrics & Gynecology

## 2011-04-06 DIAGNOSIS — B373 Candidiasis of vulva and vagina: Secondary | ICD-10-CM

## 2011-04-06 DIAGNOSIS — N949 Unspecified condition associated with female genital organs and menstrual cycle: Secondary | ICD-10-CM | POA: Insufficient documentation

## 2011-04-06 DIAGNOSIS — B3731 Acute candidiasis of vulva and vagina: Secondary | ICD-10-CM | POA: Insufficient documentation

## 2011-04-06 DIAGNOSIS — A5901 Trichomonal vulvovaginitis: Secondary | ICD-10-CM

## 2011-04-06 LAB — WET PREP, GENITAL
Clue Cells Wet Prep HPF POC: NONE SEEN
Yeast Wet Prep HPF POC: NONE SEEN

## 2011-04-06 LAB — GLUCOSE, CAPILLARY: Glucose-Capillary: 149 mg/dL — ABNORMAL HIGH (ref 70–99)

## 2011-04-07 LAB — GC/CHLAMYDIA PROBE AMP, GENITAL
Chlamydia, DNA Probe: NEGATIVE
GC Probe Amp, Genital: NEGATIVE

## 2011-04-13 ENCOUNTER — Encounter: Payer: Medicaid Other | Admitting: Advanced Practice Midwife

## 2011-04-16 ENCOUNTER — Encounter: Payer: Medicaid Other | Admitting: Internal Medicine

## 2011-04-19 NOTE — Progress Notes (Signed)
Addended by: Neomia Dear on: 04/19/2011 08:13 AM   Modules accepted: Orders

## 2011-04-27 ENCOUNTER — Telehealth: Payer: Self-pay | Admitting: *Deleted

## 2011-04-27 NOTE — Telephone Encounter (Signed)
Pt called and is asking for a referral to Dr Arminda Resides at [facility] dermatology [phone] ) She has seen him last year for cysts and they have returned but pt needs another referral for this As she has medicaid. She is c/o itching and wants to go ASAP.  Talked with Dr Arvilla Market and she approves referral. Dr Yetta Barre office called and referral made.  Appointment given for Sept 24th at 2:20  Pt may call and ask for  Appointment sooner if they have cancellation.  Pt informed.

## 2011-05-28 ENCOUNTER — Ambulatory Visit: Payer: Medicaid Other | Admitting: Internal Medicine

## 2011-06-01 ENCOUNTER — Encounter: Payer: Self-pay | Admitting: Internal Medicine

## 2011-06-01 ENCOUNTER — Ambulatory Visit (INDEPENDENT_AMBULATORY_CARE_PROVIDER_SITE_OTHER): Payer: Medicaid Other | Admitting: Internal Medicine

## 2011-06-01 DIAGNOSIS — R42 Dizziness and giddiness: Secondary | ICD-10-CM

## 2011-06-01 DIAGNOSIS — E785 Hyperlipidemia, unspecified: Secondary | ICD-10-CM

## 2011-06-01 DIAGNOSIS — E119 Type 2 diabetes mellitus without complications: Secondary | ICD-10-CM

## 2011-06-01 DIAGNOSIS — I10 Essential (primary) hypertension: Secondary | ICD-10-CM

## 2011-06-01 LAB — COMPREHENSIVE METABOLIC PANEL
ALT: 18 U/L (ref 0–35)
AST: 16 U/L (ref 0–37)
Albumin: 4.3 g/dL (ref 3.5–5.2)
Alkaline Phosphatase: 58 U/L (ref 39–117)
BUN: 14 mg/dL (ref 6–23)
CO2: 27 mEq/L (ref 19–32)
Calcium: 9.8 mg/dL (ref 8.4–10.5)
Chloride: 103 mEq/L (ref 96–112)
Creat: 0.7 mg/dL (ref 0.50–1.10)
Glucose, Bld: 153 mg/dL — ABNORMAL HIGH (ref 70–99)
Potassium: 4.4 mEq/L (ref 3.5–5.3)
Sodium: 140 mEq/L (ref 135–145)
Total Bilirubin: 0.4 mg/dL (ref 0.3–1.2)
Total Protein: 7.1 g/dL (ref 6.0–8.3)

## 2011-06-01 LAB — LIPID PANEL
Cholesterol: 226 mg/dL — ABNORMAL HIGH (ref 0–200)
HDL: 48 mg/dL (ref >39)
LDL Cholesterol: 161 mg/dL — ABNORMAL HIGH (ref 0–99)
Total CHOL/HDL Ratio: 4.7 Ratio
Triglycerides: 83 mg/dL (ref <150)
VLDL: 17 mg/dL (ref 0–40)

## 2011-06-01 LAB — GLUCOSE, CAPILLARY: Glucose-Capillary: 155 mg/dL — ABNORMAL HIGH (ref 70–99)

## 2011-06-01 LAB — POCT GLYCOSYLATED HEMOGLOBIN (HGB A1C): Hemoglobin A1C: 6.5

## 2011-06-01 NOTE — Assessment & Plan Note (Signed)
Blood pressure above the target goal. This is most likely secondary to medical noncompliance with blood pressure medication. I have advised her to continue taking her current medication which is very low dose. I have advised her to check her blood pressure regularly and to call us back if her numbers are higher than 140/90. We will provide refill today. Her electrolytes were recently checked and were within normal limits.

## 2011-06-01 NOTE — Assessment & Plan Note (Signed)
Patient denies taking pravastatin at this time. She reports she cannot tolerate medication and has not taken it almost a year ago. Today we will check fasting lipid panel and will initiate medication if indicated. We have discussed LDL goal given her diabetes. I have also discussed recommended diet and exercising. We will check liver function tests as well.

## 2011-06-01 NOTE — Assessment & Plan Note (Signed)
Unclear etiology. Physical exam findings are completely within normal limits including completely normal neurologic examination. Orthostatic vitals were checked and were negative for orthostatic hypotension. Patient was advised to drink plenty of fluids to maintain hydration. She was also advised to avoid strenuous physical activities. Looking at her medication list she has been on several medications that can cause dizziness including Ambien, Vicodin, amitriptyline. Patient tells me that she has not been taking any of those medications. I advised her if her symptoms persist to call his back for further evaluation.

## 2011-06-01 NOTE — Progress Notes (Signed)
  Subjective:    Patient ID: Bridget Owens, female    DOB: 1956/02/07, 55 y.o.   MRN: [id number]  HPI Patient is 55 year old female with past medical history outlined below who presents to clinic for regular followup on diabetes, blood pressure, and cholesterol. Her main concern at this time is several episodes of dizziness over the past few months. She notices dizziness while doing physical activity but isn't sure what type of physical activity exactly brings it on. She also told me that these episodes occur at random and on not related to any specific action. She denies headache, visual changes, tinnitus, no episodes of chest pain or shortness of breath, no cough, no abdominal or urinary concerns. She also denies weakness or numbness in her extremities, no difficulty with ambulating, she performs activities of daily living with no difficulties. She has had similar episodes of dizziness in the past and is unsure if she has done anything specific for those episodes.   Review of Systems Per HPI    Objective:   Physical Exam  Constitutional: Vital signs reviewed.  Patient is a well-developed and well-nourished in no acute distress and cooperative with exam. Alert and oriented x3.  Eyes: PERRL, EOMI, conjunctivae normal, No scleral icterus.  Neck: Supple, Trachea midline normal ROM, No JVD, mass, thyromegaly, or carotid bruit present.  Cardiovascular: RRR, S1 normal, S2 normal, no MRG, pulses symmetric and intact bilaterally Pulmonary/Chest: CTAB, no wheezes, rales, or rhonchi Abdominal: Soft. Non-tender, non-distended, bowel sounds are normal, no masses, organomegaly, or guarding present.  Musculoskeletal: No joint deformities, erythema, or stiffness, ROM full and no nontender Hematology: no cervical, inginal, or axillary adenopathy.  Neurological: A&O x3, Strenght is normal and symmetric bilaterally, cranial nerve II-XII are grossly intact, no focal motor deficit, sensory intact to light touch  bilaterally.  Skin: Warm, dry and intact. No rash, cyanosis, or clubbing.  Psychiatric: Normal mood and affect. speech and behavior is normal. Judgment and thought content normal. Cognition and memory are normal.          Assessment & Plan:

## 2011-06-01 NOTE — Assessment & Plan Note (Signed)
Well-controlled on current medication regimen. We will continue the same. Patient is up-to-date on yearly ophthalmology appointments. I examined her feet today and the results are physical examination are within normal limits. I have encouraged her to maintain compliance with medications as well as recommended diet and exercise.

## 2011-06-08 ENCOUNTER — Other Ambulatory Visit: Payer: Self-pay | Admitting: Internal Medicine

## 2011-06-28 ENCOUNTER — Encounter: Payer: Medicaid Other | Admitting: Internal Medicine

## 2011-07-03 ENCOUNTER — Encounter: Payer: Medicaid Other | Admitting: Internal Medicine

## 2011-07-11 LAB — GLUCOSE, CAPILLARY
Glucose-Capillary: 151 — ABNORMAL HIGH
Glucose-Capillary: 155 — ABNORMAL HIGH

## 2011-07-13 LAB — GLUCOSE, CAPILLARY: Glucose-Capillary: 220 mg/dL — ABNORMAL HIGH (ref 70–99)

## 2011-07-23 ENCOUNTER — Encounter: Payer: Medicaid Other | Admitting: Internal Medicine

## 2011-07-25 ENCOUNTER — Ambulatory Visit (INDEPENDENT_AMBULATORY_CARE_PROVIDER_SITE_OTHER): Payer: Medicaid Other | Admitting: Internal Medicine

## 2011-07-25 ENCOUNTER — Encounter: Payer: Self-pay | Admitting: Internal Medicine

## 2011-07-25 VITALS — BP 152/89 | HR 71 | Temp 97.6°F | Ht 67.0 in | Wt 187.6 lb

## 2011-07-25 DIAGNOSIS — I10 Essential (primary) hypertension: Secondary | ICD-10-CM

## 2011-07-25 DIAGNOSIS — E119 Type 2 diabetes mellitus without complications: Secondary | ICD-10-CM

## 2011-07-25 DIAGNOSIS — L659 Nonscarring hair loss, unspecified: Secondary | ICD-10-CM

## 2011-07-25 DIAGNOSIS — E785 Hyperlipidemia, unspecified: Secondary | ICD-10-CM

## 2011-07-25 LAB — GLUCOSE, CAPILLARY: Glucose-Capillary: 164 mg/dL — ABNORMAL HIGH (ref 70–99)

## 2011-07-25 LAB — TSH: TSH: 3.423 u[IU]/mL (ref 0.350–4.500)

## 2011-07-25 LAB — POCT GLYCOSYLATED HEMOGLOBIN (HGB A1C): Hemoglobin A1C: 6.8

## 2011-07-25 MED ORDER — LISINOPRIL 10 MG PO TABS: 10.0000 mg | ORAL_TABLET | Freq: Every day | ORAL | Status: DC

## 2011-07-25 MED ORDER — PRAVASTATIN SODIUM 20 MG PO TABS: 20.0000 mg | ORAL_TABLET | Freq: Every day | ORAL | Status: DC

## 2011-07-25 MED ORDER — ATENOLOL 25 MG PO TABS: 25.0000 mg | ORAL_TABLET | Freq: Every day | ORAL | Status: DC

## 2011-07-25 NOTE — Progress Notes (Signed)
Subjective:   Patient ID: Bridget Owens female   DOB: 05/24/1956 55 y.o.   MRN: [id number]  HPI: [name] is a 55 y.o.  Female with PMH significant as outlined below who presented to the clinic for blood pressure and boils and hair loss. Patient noted that since she has been taking Lisinopril she notices hair loss significantly. She noted it is getting worse. She further noted that she has a boil on her pubic area which has been present for a couple of days. It seems to get worse. Denies any fevers or chills.   Past Medical History  Diagnosis Date  . Bell's palsy 01/2009    Left sided  . Type II diabetes mellitus   . Hypertension   . Hyperlipidemia   . Allergic rhinitis   . Intolerance, drug     Leg cramps on Lipitor  . GERD (gastroesophageal reflux disease)   . Uterine fibroid   . Chest pain     Exercise stress test negative 6/07  . Postmenopausal symptoms     Hot flashes, vaginal dryness, iritability, difficulty sleeping. as of 2005.  Marland Kitchen Insomnia   . External hemorrhoid   . Lipoma 12/11    Posterior neck.   . Osteoarthritis     Carpometacarpal joint of right thumb  . Trigger finger     Right thumb  . Sebaceous cyst of breast     Has been refered to derm.  . Adenomatous colon polyp 6/09    Resected on colonoscopy, no high grade dysplasia.   . Hypertensive retinopathy     Followed by Dr. Elmer Picker.  . Diabetic peripheral neuropathy   . Postmenopausal bleeding 9/05    Endometrial biopsy showed  FOCAL TUBAL METAPLASIA    Current Outpatient Prescriptions  Medication Sig Dispense Refill  . albuterol (PROAIR HFA) 108 (90 BASE) MCG/ACT inhaler Inhale 2 puffs into the lungs every 6 (six) hours as needed for wheezing.  1 Inhaler  3  . amitriptyline (ELAVIL) 50 MG tablet Take 50 mg by mouth at bedtime.        Marland Kitchen aspirin 81 MG EC tablet Take 81 mg by mouth daily.        Marland Kitchen gabapentin (NEURONTIN) 300 MG capsule Take 300 mg by mouth 3 (three) times daily.        Marland Kitchen glucose blood  (BLOOD GLUCOSE TEST STRIPS) test strip Use to check your blood sugar 1 - 2 times a day       . HYDROcodone-acetaminophen (VICODIN) 5-500 MG per tablet Take 1 tablet by mouth every 6 (six) hours as needed. For pain       . ibuprofen (ADVIL,MOTRIN) 400 MG tablet Take 1 tablet (400 mg total) by mouth 3 (three) times daily with meals. As needed for pain  60 tablet  1  . JANUMET 50-500 MG per tablet TAKE ONE (1) TABLET(S) BY MOUTH TWICE DAILY  60 tablet  6  . Lancets MISC Use to test blood sugar 1 -2 times a day       . lisinopril (PRINIVIL,ZESTRIL) 5 MG tablet Take 1 tablet (5 mg total) by mouth daily.  30 tablet  11  . pravastatin (PRAVACHOL) 40 MG tablet Take 80 mg by mouth at bedtime.        Marland Kitchen zolpidem (AMBIEN CR) 12.5 MG CR tablet Take 1 tablet (12.5 mg total) by mouth at bedtime as needed for sleep.  30 tablet  1   Review of Systems: Constitutional: Denies fever,  chills, diaphoresis, appetite change and fatigue.    Respiratory: Denies SOB, DOE, cough, chest tightness,  and wheezing.   Cardiovascular: Denies chest pain, palpitations and leg swelling.  Gastrointestinal: Denies nausea, vomiting, abdominal pain, diarrhea, constipation, blood in stool and abdominal distention.  Genitourinary: Denies dysuria, urgency, frequency, hematuria, flank pain and difficulty urinating.  Neurological: Denies dizziness, seizures, syncope, weakness, light-headedness, numbness and headaches.  Hematological: Denies adenopathy. Easy bruising, personal or family bleeding history   Objective:  Physical Exam: Filed Vitals:   07/25/11 0824  BP: 152/89  Pulse: 71  Temp: 97.6 F (36.4 C)  TempSrc: Oral  Height: 5\' 7"  (1.702 m)  Weight: 187 lb 9.6 oz (85.095 kg)   Constitutional: Vital signs reviewed.  Patient is a well-developed and well-nourished  in no acute distress and cooperative with exam. Alert and oriented x3.  Neck: Supple,  Cardiovascular: RRR, S1 normal, S2 normal, no MRG, pulses symmetric and intact  bilaterally Pulmonary/Chest: CTAB, no wheezes, rales, or rhonchi Abdominal: Soft. Non-tender, non-distended, bowel sounds are normal, no masses, organomegaly, or guarding present.  GU: no CVA tenderness Musculoskeletal: No joint deformities, erythema, or stiffness, ROM full and no nontender Hematology: no cervical, inginal, or axillary adenopathy.  Neurological: A&O x3, Strenght is normal and symmetric bilaterally, cranial nerve II-XII are grossly intact, no focal motor deficit, sensory intact to light touch bilaterally.  Skin: pubic area a small boil is notable. No erythema, no drainage noted. Tender to palpation.

## 2011-07-25 NOTE — Assessment & Plan Note (Deleted)
Blood pressure elevated. Will start Hyzaar

## 2011-07-25 NOTE — Patient Instructions (Addendum)
1. Will increase your blood pressure medication ( Lisinopril) to 10 mg. 2. We will start a cholesterol medication ( pravastatin)

## 2011-07-30 ENCOUNTER — Emergency Department (HOSPITAL_COMMUNITY)
Admission: EM | Admit: 2011-07-30 | Discharge: 2011-07-30 | Disposition: A | Discharge status: Home / Self Care | Payer: Medicaid Other | Attending: Emergency Medicine | Admitting: Emergency Medicine

## 2011-07-30 DIAGNOSIS — J4489 Other specified chronic obstructive pulmonary disease: Secondary | ICD-10-CM | POA: Insufficient documentation

## 2011-07-30 DIAGNOSIS — J189 Pneumonia, unspecified organism: Secondary | ICD-10-CM | POA: Insufficient documentation

## 2011-07-30 DIAGNOSIS — F17200 Nicotine dependence, unspecified, uncomplicated: Secondary | ICD-10-CM | POA: Insufficient documentation

## 2011-07-30 DIAGNOSIS — R109 Unspecified abdominal pain: Secondary | ICD-10-CM | POA: Insufficient documentation

## 2011-07-30 DIAGNOSIS — E119 Type 2 diabetes mellitus without complications: Secondary | ICD-10-CM | POA: Insufficient documentation

## 2011-07-30 DIAGNOSIS — J449 Chronic obstructive pulmonary disease, unspecified: Secondary | ICD-10-CM | POA: Insufficient documentation

## 2011-07-30 DIAGNOSIS — R059 Cough, unspecified: Secondary | ICD-10-CM | POA: Insufficient documentation

## 2011-07-30 DIAGNOSIS — I10 Essential (primary) hypertension: Secondary | ICD-10-CM | POA: Insufficient documentation

## 2011-07-30 DIAGNOSIS — R05 Cough: Secondary | ICD-10-CM | POA: Insufficient documentation

## 2011-07-31 NOTE — Assessment & Plan Note (Signed)
Will consider to recheck lipid panel at the next office for possible changes in management.

## 2011-07-31 NOTE — Assessment & Plan Note (Signed)
Hgb A1c well controlled. Will continue current medication.

## 2011-07-31 NOTE — Assessment & Plan Note (Signed)
Unclear etiology. Unlikely ACEi since this causes only in less then 1 % alopecia. Will check TSH and reevaluate at the next office visit.

## 2011-08-08 ENCOUNTER — Encounter: Payer: Medicaid Other | Admitting: Internal Medicine

## 2011-08-13 ENCOUNTER — Encounter: Payer: Self-pay | Admitting: Internal Medicine

## 2011-08-13 ENCOUNTER — Ambulatory Visit (INDEPENDENT_AMBULATORY_CARE_PROVIDER_SITE_OTHER): Payer: Medicaid Other | Admitting: Internal Medicine

## 2011-08-13 VITALS — BP 179/92 | HR 83 | Temp 97.0°F | Ht 68.0 in

## 2011-08-13 DIAGNOSIS — K219 Gastro-esophageal reflux disease without esophagitis: Secondary | ICD-10-CM

## 2011-08-13 DIAGNOSIS — J441 Chronic obstructive pulmonary disease with (acute) exacerbation: Secondary | ICD-10-CM | POA: Insufficient documentation

## 2011-08-13 MED ORDER — DOXYCYCLINE HYCLATE 100 MG PO TABS: 100.0000 mg | ORAL_TABLET | Freq: Two times a day (BID) | ORAL | Status: AC

## 2011-08-13 MED ORDER — PREDNISONE 50 MG PO TABS: ORAL_TABLET | ORAL | Status: AC

## 2011-08-13 MED ORDER — PANTOPRAZOLE SODIUM 20 MG PO TBEC: 20.0000 mg | DELAYED_RELEASE_TABLET | Freq: Every day | ORAL | Status: DC

## 2011-08-13 NOTE — Progress Notes (Signed)
  Subjective:    Patient ID: Bridget Owens, female    DOB: [birth year], 55 y.o.   MRN: [id number]  HPI  Bridget Owens is a 22 hour female with past medical history most significant for chronic tobacco abuse and diabetes.  She comes in today for a persistent cough. Patient has wheezing all the time. The cough is associated with green sputum. Patient denies fever but has been having chills at night. Patient is easily short-winded and has actually stopped smoking since last 5 days. Patient has tried over-the-counter cough suppressants which have not really worked. The patient was seen in the ED about 2 weeks ago when lisinopril was stopped and patient was started on losartan.  Patient also complains of occasional acid reflux.  Review of Systems  Constitutional: Positive for chills and fatigue. Negative for fever, activity change and appetite change.  HENT: Negative for sore throat.   Respiratory: Positive for cough, chest tightness, shortness of breath and wheezing.   Cardiovascular: Negative for chest pain and leg swelling.  Gastrointestinal: Negative for nausea, abdominal pain, diarrhea, constipation and abdominal distention.  Genitourinary: Negative for frequency, hematuria and difficulty urinating.  Neurological: Negative for dizziness and headaches.  Psychiatric/Behavioral: Negative for suicidal ideas and behavioral problems.       Objective:   Physical Exam  Constitutional: She is oriented to person, place, and time. She appears well-developed and well-nourished. She appears distressed.  HENT:  Head: Normocephalic and atraumatic.  Eyes: Conjunctivae and EOM are normal. Pupils are equal, round, and reactive to light. No scleral icterus.  Neck: Normal range of motion. Neck supple. No JVD present. No thyromegaly present.  Cardiovascular: Normal rate, regular rhythm, normal heart sounds and intact distal pulses.  Exam reveals no gallop and no friction rub.   No murmur heard. Pulmonary/Chest:  Effort normal. No respiratory distress. She has wheezes (Bilateral wheezing). She has no rales.  Abdominal: Soft. Bowel sounds are normal. She exhibits no distension and no mass. There is no tenderness. There is no rebound and no guarding.  Musculoskeletal: Normal range of motion. She exhibits no edema and no tenderness.  Lymphadenopathy:    She has no cervical adenopathy.  Neurological: She is alert and oriented to person, place, and time.  Psychiatric: She has a normal mood and affect. Her behavior is normal.          Assessment & Plan:

## 2011-08-13 NOTE — Patient Instructions (Signed)

## 2011-08-17 ENCOUNTER — Encounter: Payer: Medicaid Other | Admitting: Internal Medicine

## 2011-08-20 ENCOUNTER — Encounter: Payer: Medicaid Other | Admitting: Internal Medicine

## 2011-08-20 ENCOUNTER — Other Ambulatory Visit: Payer: Self-pay | Admitting: Internal Medicine

## 2011-09-06 ENCOUNTER — Ambulatory Visit (INDEPENDENT_AMBULATORY_CARE_PROVIDER_SITE_OTHER): Payer: Medicaid Other | Admitting: Internal Medicine

## 2011-09-06 ENCOUNTER — Encounter: Payer: Self-pay | Admitting: Internal Medicine

## 2011-09-06 VITALS — BP 145/84 | HR 82 | Temp 98.2°F | Ht 64.0 in | Wt 184.0 lb

## 2011-09-06 DIAGNOSIS — R5383 Other fatigue: Secondary | ICD-10-CM

## 2011-09-06 DIAGNOSIS — B373 Candidiasis of vulva and vagina: Secondary | ICD-10-CM

## 2011-09-06 DIAGNOSIS — B3731 Acute candidiasis of vulva and vagina: Secondary | ICD-10-CM

## 2011-09-06 DIAGNOSIS — L853 Xerosis cutis: Secondary | ICD-10-CM | POA: Insufficient documentation

## 2011-09-06 DIAGNOSIS — R5381 Other malaise: Secondary | ICD-10-CM

## 2011-09-06 DIAGNOSIS — M25571 Pain in right ankle and joints of right foot: Secondary | ICD-10-CM

## 2011-09-06 DIAGNOSIS — L738 Other specified follicular disorders: Secondary | ICD-10-CM

## 2011-09-06 DIAGNOSIS — L659 Nonscarring hair loss, unspecified: Secondary | ICD-10-CM

## 2011-09-06 DIAGNOSIS — M25579 Pain in unspecified ankle and joints of unspecified foot: Secondary | ICD-10-CM

## 2011-09-06 LAB — CBC
HCT: 40.2 % (ref 36.0–46.0)
Hemoglobin: 13.3 g/dL (ref 12.0–15.0)
MCH: 29.1 pg (ref 26.0–34.0)
MCHC: 33.1 g/dL (ref 30.0–36.0)
MCV: 88 fL (ref 78.0–100.0)
Platelets: 295 10*3/uL (ref 150–400)
RBC: 4.57 MIL/uL (ref 3.87–5.11)
RDW: 12.8 % (ref 11.5–15.5)
WBC: 9.1 10*3/uL (ref 4.0–10.5)

## 2011-09-06 LAB — GLUCOSE, CAPILLARY: Glucose-Capillary: 305 mg/dL — ABNORMAL HIGH (ref 70–99)

## 2011-09-06 MED ORDER — FLUCONAZOLE 150 MG PO TABS
150.0000 mg | ORAL_TABLET | Freq: Once | ORAL | Status: AC
Start: 2011-09-06 — End: 2011-09-07

## 2011-09-06 MED ORDER — IBUPROFEN 600 MG PO TABS: 600.0000 mg | ORAL_TABLET | Freq: Four times a day (QID) | ORAL | Status: AC | PRN

## 2011-09-06 NOTE — Progress Notes (Signed)
  Subjective:    Patient ID: Bridget Owens, female    DOB: 1956-07-06, 55 y.o.   MRN: [id number]  HPI  Her history is significant for diabetes, hypertension and lipidemia.  She presents today after falling while walking from the store over a week ago. She reports that her right leg and ankle is not hurting at this time but it frequently aches when at rest. She reports mild swelling to the right ankle but has full range of motion without pain. She is also complaining of dry skin and thinning hair and is requesting that her thyroid levels be checked today.  In addition she states that the antibiotics and steroids used to treat recent bronchitis has caused a vaginal yeast infection.  Review of Systems    as per HPI and PMH Objective:   Physical Exam  Constitutional: She is oriented to person, place, and time. She appears well-developed and well-nourished. No distress.  HENT:  Head: Normocephalic and atraumatic.  Eyes: Conjunctivae and EOM are normal. Pupils are equal, round, and reactive to light.  Neck: Normal range of motion. Neck supple. No thyromegaly present.  Cardiovascular: Normal rate, regular rhythm, normal heart sounds and intact distal pulses.   Pulmonary/Chest: Effort normal and breath sounds normal.  Musculoskeletal: Normal range of motion. She exhibits no edema.  Neurological: She is alert and oriented to person, place, and time.  Skin: Skin is warm and dry.  Psychiatric: She has a normal mood and affect. Her behavior is normal. Judgment and thought content normal.          Assessment & Plan:   See problem list for full assessment and plan: #1 xerosis: She does have dry skin throughout. TSH was recently checked in October and within normal limits at 3.423. - CBC today to rule out anemia.  #2 Vaginal candidiasis: Likely secondary to recent treatment with doxycycline for bronchitis will treat with 150 mg fluconazole times one.  #3 right ankle pain: Patient is without pain  or swelling on exam today. Will prescribe ibuprofen when necessary for pain and have patient followup with her primary care physician Dr. Arvilla Market as scheduled in December.

## 2011-09-06 NOTE — Assessment & Plan Note (Signed)
Complains of continued hair loss and dry skin, TSH within normal limits

## 2011-09-06 NOTE — Patient Instructions (Signed)
We will check your blood work today.  Keep your scheduled appointment with Dr. Arvilla Market in December.

## 2011-09-20 ENCOUNTER — Telehealth: Payer: Self-pay | Admitting: *Deleted

## 2011-09-20 NOTE — Telephone Encounter (Signed)
Authorization approved for Janumet 50/500 mg starting 09/20/2011-09/19/2012.  Pt has been on Metformin before.  Angelina Ok, RN 09/20/2011 11:10 AM.

## 2011-09-21 ENCOUNTER — Encounter: Payer: Medicaid Other | Admitting: Internal Medicine

## 2011-09-24 ENCOUNTER — Other Ambulatory Visit: Payer: Self-pay | Admitting: *Deleted

## 2011-09-24 MED ORDER — LOSARTAN POTASSIUM-HCTZ 100-25 MG PO TABS: 1.0000 | ORAL_TABLET | Freq: Every day | ORAL | Status: DC

## 2011-10-16 ENCOUNTER — Other Ambulatory Visit: Payer: Self-pay | Admitting: *Deleted

## 2011-10-16 DIAGNOSIS — G4700 Insomnia, unspecified: Secondary | ICD-10-CM

## 2011-10-16 NOTE — Telephone Encounter (Signed)
Pt called with c/o not sleeping well.

## 2011-10-17 MED ORDER — ZOLPIDEM TARTRATE ER 12.5 MG PO TBCR: 12.5000 mg | EXTENDED_RELEASE_TABLET | Freq: Every evening | ORAL | Status: DC | PRN

## 2011-10-17 NOTE — Telephone Encounter (Signed)
Rx called in 

## 2011-10-17 NOTE — Telephone Encounter (Signed)
Will you please call in her script?  Thank you!

## 2011-10-23 ENCOUNTER — Telehealth: Payer: Self-pay | Admitting: *Deleted

## 2011-10-23 NOTE — Telephone Encounter (Signed)
Dr Arvilla Market to complete paperwork. For approval Zolpidem ER. Stanton Kidney Cally Nygard RN 10/23/11 3PM

## 2011-11-09 ENCOUNTER — Encounter: Payer: Self-pay | Admitting: Internal Medicine

## 2011-11-09 ENCOUNTER — Ambulatory Visit (INDEPENDENT_AMBULATORY_CARE_PROVIDER_SITE_OTHER): Payer: Medicaid Other | Admitting: Internal Medicine

## 2011-11-09 VITALS — BP 126/80 | HR 76 | Temp 96.9°F | Ht 64.0 in | Wt 181.6 lb

## 2011-11-09 DIAGNOSIS — Z1239 Encounter for other screening for malignant neoplasm of breast: Secondary | ICD-10-CM

## 2011-11-09 DIAGNOSIS — Z0000 Encounter for general adult medical examination without abnormal findings: Secondary | ICD-10-CM

## 2011-11-09 DIAGNOSIS — E119 Type 2 diabetes mellitus without complications: Secondary | ICD-10-CM

## 2011-11-09 DIAGNOSIS — Z79899 Other long term (current) drug therapy: Secondary | ICD-10-CM

## 2011-11-09 DIAGNOSIS — Z1231 Encounter for screening mammogram for malignant neoplasm of breast: Secondary | ICD-10-CM

## 2011-11-09 DIAGNOSIS — G4700 Insomnia, unspecified: Secondary | ICD-10-CM

## 2011-11-09 DIAGNOSIS — E785 Hyperlipidemia, unspecified: Secondary | ICD-10-CM

## 2011-11-09 LAB — LIPID PANEL
Cholesterol: 176 mg/dL (ref 0–200)
HDL: 47 mg/dL (ref >39)
LDL Cholesterol: 111 mg/dL — ABNORMAL HIGH (ref 0–99)
Total CHOL/HDL Ratio: 3.7 Ratio
Triglycerides: 92 mg/dL (ref <150)
VLDL: 18 mg/dL (ref 0–40)

## 2011-11-09 LAB — COMPREHENSIVE METABOLIC PANEL
ALT: 17 U/L (ref 0–35)
AST: 13 U/L (ref 0–37)
Albumin: 4.5 g/dL (ref 3.5–5.2)
Alkaline Phosphatase: 66 U/L (ref 39–117)
BUN: 18 mg/dL (ref 6–23)
CO2: 29 mEq/L (ref 19–32)
Calcium: 10.4 mg/dL (ref 8.4–10.5)
Chloride: 104 mEq/L (ref 96–112)
Creat: 0.71 mg/dL (ref 0.50–1.10)
Glucose, Bld: 160 mg/dL — ABNORMAL HIGH (ref 70–99)
Potassium: 3.9 mEq/L (ref 3.5–5.3)
Sodium: 142 mEq/L (ref 135–145)
Total Bilirubin: 0.3 mg/dL (ref 0.3–1.2)
Total Protein: 7.1 g/dL (ref 6.0–8.3)

## 2011-11-09 LAB — POCT GLYCOSYLATED HEMOGLOBIN (HGB A1C): Hemoglobin A1C: 9

## 2011-11-09 LAB — TSH: TSH: 2.689 u[IU]/mL (ref 0.350–4.500)

## 2011-11-09 LAB — GLUCOSE, CAPILLARY: Glucose-Capillary: 152 mg/dL — ABNORMAL HIGH (ref 70–99)

## 2011-11-09 MED ORDER — TRAZODONE HCL 50 MG PO TABS: 50.0000 mg | ORAL_TABLET | Freq: Every day | ORAL | Status: AC

## 2011-11-09 MED ORDER — ONDANSETRON HCL 4 MG PO TABS: 4.0000 mg | ORAL_TABLET | Freq: Every day | ORAL | Status: DC | PRN

## 2011-11-09 MED ORDER — SITAGLIPTIN PHOS-METFORMIN HCL 50-500 MG PO TABS: 1.0000 | ORAL_TABLET | Freq: Two times a day (BID) | ORAL | Status: DC

## 2011-11-09 MED ORDER — LOSARTAN POTASSIUM-HCTZ 100-25 MG PO TABS
1.0000 | ORAL_TABLET | Freq: Every day | ORAL | Status: DC
Start: 2011-11-09 — End: 2012-01-02

## 2011-11-09 NOTE — Progress Notes (Signed)
Patient ID: Bridget Owens, female   DOB: [birth year], 56 y.o.   MRN: [id number]   Pt reports frequent headaches.  They started in November after thanksgiving.  She reports she wakes up with the headache located in the back of her head.  She describes it as occasionally throbbing.  She reports associated nausea but no emesis.  She denies any aura or visual change associated with the headache.  She has tried taking a OTC sinus pill for the headache with some relief.  She reports some muscle aches/neck pain.  She reports the headaches don't last very long; only for a couple of minutes usually but occasionally last for a few hours.    SHe reports significant difficulty sleeping that began in December.  She reports difficulty falling asleep; she is able to stay asleep once she falls asleep.  She denies any significant worries or anxiety.  She does not go to bed at the same time every night and watches TV in bed.  She is currently caring for her grandchild (age 28mo) during the night while her daughter works.

## 2011-11-09 NOTE — Patient Instructions (Signed)
Schedule a followup appointment with Dr. Arvilla Market in March. We will do your annual pelvic exam and Pap smear at this time. Trazodone is a new medicine to help with your sleep. Start by taking one half of a tablet at bedtime.  If this does not work try taking one full tablet. If hyou still have difficulty sleeping with one full tablet, you may increase to 2 tablets at bedtime. Zofran is a medicine to help with nausea. Use as directed. Start taking your Janumet  twice a day. This will help improve her diabetes. We will refer you to our diabetic educator so that you can discuss healthier eating habits. I will call you if any of your blood work is abnormal.

## 2011-11-13 ENCOUNTER — Telehealth: Payer: Self-pay | Admitting: *Deleted

## 2011-11-13 NOTE — Telephone Encounter (Signed)
Pt called with c/o pain to left eye to ear.  She gets sharp pain to head. Pain is on and off, morning, noon and at night. Rates pain 10/10.  She was told to try tylenol but gets no relief.  Onset of pain in november.  Pt talked to PCP about this at last visit 2/1 Pt states she has seen eye doctor and states headaches not related. She wants an xray to see what's wrong with her head.  Please advise Pt # [phone]

## 2011-11-14 NOTE — Telephone Encounter (Signed)
Pt informed and appointment scheduled for tomorrow 

## 2011-11-14 NOTE — Telephone Encounter (Signed)
Pt called again, no answer. 

## 2011-11-14 NOTE — Telephone Encounter (Signed)
Please inform her that an x-ray of her head would not be helpful in evaluating her headaches b/c it would not provide andy useful information unless she suffered a skull fracture.  Please have her schedule an appt for evaluation with [facility] doc.  Thank you!

## 2011-11-14 NOTE — Telephone Encounter (Signed)
Pt called, message left as pt not home. Will try again

## 2011-11-15 ENCOUNTER — Ambulatory Visit: Payer: Medicaid Other | Admitting: Internal Medicine

## 2011-11-19 ENCOUNTER — Ambulatory Visit: Payer: Medicaid Other | Admitting: Internal Medicine

## 2011-11-19 ENCOUNTER — Ambulatory Visit: Payer: Medicaid Other | Admitting: Dietician

## 2011-11-22 ENCOUNTER — Ambulatory Visit (INDEPENDENT_AMBULATORY_CARE_PROVIDER_SITE_OTHER): Payer: Medicaid Other | Admitting: Internal Medicine

## 2011-11-22 ENCOUNTER — Encounter: Payer: Self-pay | Admitting: Dietician

## 2011-11-22 ENCOUNTER — Encounter: Payer: Self-pay | Admitting: Internal Medicine

## 2011-11-22 ENCOUNTER — Ambulatory Visit (INDEPENDENT_AMBULATORY_CARE_PROVIDER_SITE_OTHER): Payer: Medicaid Other | Admitting: Dietician

## 2011-11-22 VITALS — BP 153/85 | HR 61 | Temp 95.2°F | Ht 64.0 in | Wt 185.1 lb

## 2011-11-22 DIAGNOSIS — E1149 Type 2 diabetes mellitus with other diabetic neurological complication: Secondary | ICD-10-CM

## 2011-11-22 DIAGNOSIS — R51 Headache: Secondary | ICD-10-CM

## 2011-11-22 DIAGNOSIS — E1142 Type 2 diabetes mellitus with diabetic polyneuropathy: Secondary | ICD-10-CM

## 2011-11-22 LAB — GLUCOSE, CAPILLARY: Glucose-Capillary: 149 mg/dL — ABNORMAL HIGH (ref 70–99)

## 2011-11-22 NOTE — Patient Instructions (Signed)
For your plantar wart on the bottom of your right foot:  Soak foot and clean and dry well. Apply silver/grey duct tape to wart (be sure it lays flat and doesn't fold over)  Leave duct tape on for 6 days without removing. On Day 6- remove tape and soak, wash and dry again. Leave tape off on 6th night. Reapply tape the next day for another 6 days  For Rice: try barley, basmati rice or brown rice Use tub margarine instead of stick Use liquid coffee creamer or low fat milk instead of powdered  Your goal for quitting smoking is to cut down to 10 cigarrettes per day in the next month.

## 2011-11-22 NOTE — Progress Notes (Signed)
Patient ID: Bridget Owens, female   DOB: Apr 09, 1956, 56 y.o.   MRN: [id number]  56 year old woman with past medical history as to the clinic complaining of headaches. -Since November 2005. -Every morning before she wakes up. She wakes up with the headaches. This but in 20 minutes after waking up. Sometimes occurs late in the afternoon which lasts 20 minutes as a -Pain is mostly throbbing, on the right side of the head and temporal region. Also some on the back of the head.- -Does not take any medicines except Tylenol and ibuprofen which he started taking recently after her last clinic visit. No improvement with these medicines. -No recent stressors in life, change in medicines, change in diet, head injury or any other precipitating event that she could remember -No vision changes,dizziness, lightheadedness,  vomiting, abdominal pain, sweating or any other cluster symptom. May have some nausea at the time of headaches -Also having hot flashes since past one year that have progressively gotten worse  Physical exam   General Appearance:     Filed Vitals:   11/22/11 1326  BP: 153/85  Pulse: 61  Temp: 95.2 F (35.1 C)  Height: 5\' 4"  (1.626 m)  Weight: 185 lb 1.6 oz (83.961 kg)  SpO2: 100%     Alert, cooperative, no distress, appears stated age  Head:    Normocephalic, without obvious abnormality, atraumatic  Eyes:    PERRL, conjunctiva/corneas clear, EOM's intact, fundi    benign, both eyes       Neck:   Supple, symmetrical, trachea midline, no adenopathy;       thyroid:  No enlargement/tenderness/nodules; no carotid   bruit or JVD  Lungs:     Clear to auscultation bilaterally, respirations unlabored  Chest wall:    No tenderness or deformity  Heart:    Regular rate and rhythm, S1 and S2 normal, no murmur, rub   or gallop  Abdomen:     Soft, non-tender, bowel sounds active all four quadrants,    no masses, no organomegaly  Extremities:   Extremities normal, atraumatic, no cyanosis or  edema  Pulses:   2+ and symmetric all extremities  Skin:   Skin color, texture, turgor normal, no rashes or lesions  Neurologic:  nonfocal grossly   Review of systems As per history of present illness otherwise Constitutional: Denies fever, chills, diaphoresis, appetite change and fatigue.  Respiratory: Denies SOB, DOE, cough, chest tightness,  and wheezing.   Cardiovascular: Denies chest pain, palpitations and leg swelling.  Gastrointestinal: Denies nausea, vomiting, abdominal pain, diarrhea, constipation, blood in stool and abdominal distention.  Skin: Denies pallor, rash and wound.  Neurological: Denies dizziness, light-headedness, numbness and headaches.

## 2011-11-22 NOTE — Patient Instructions (Signed)
Follow up with pcp in 4-6 weeks  I will call you with your CT scan result Get your eyes checked by your opthalmologist Keep taking 1-2 tabs of ibuprofen for headache at this time. If it does not get better in next 2 weeks let us know and we will schedule an earlier appointment for you.

## 2011-11-22 NOTE — Progress Notes (Signed)
Diabetes Self-Management Training (DSMT)  Initial Visit  11/22/2011 [name], identified by name and date of birth, is a 56 y.o. female with Type 2 Diabetes. Year of diabetes diagnosis: 2000 Other persons present: no  ASSESSMENT Patient concerns are Healthy Lifestyle and Glycemic control.  There were no vitals taken for this visit. There is no height or weight on file to calculate BMI. Lab Results  Component Value Date   LDLCALC 111* 11/09/2011   Lab Results  Component Value Date   HGBA1C 9.0 11/09/2011   Medication Nutrition Monitor: accu-chek  Labs reviewed.  DIABETES BUNDLE: A1C in past 6 months? Yes.  Less than 7%? No LDL in past year? Yes.  Less than 100 mg/dL? No Microalbumin ratio in past year? No. Patient taking ACE or ARB? Yes. Blood pressure less than 130/80? No.  Foot exam in last year? Yes. Eye exam in past year? Yes. Tobacco use? Yes.  Smoking cessation offered? Yes Pneumovax? Yes Flu vaccine? No Asprin? Yes Patients belief/attitude about diabetes: Diabetes can be controlled. Self foot exams daily: Yes Diabetes Complications: Neuropathy and Retinopathy Family history of diabetes: Yes Support systems: family Special needs: None Prior DM Education: Yes   Medications See Medications list.  Not taking as prescribed   Exercise Plan Doing walking for 30 minutesa day.   Self-Monitoring Frequency of testing: 1-2 times/day Breakfast: 100s per patient- did not bring meter  Hyperglycemia: No Hypoglycemia: No   Meal Planning Some knowledge and Interested in improving   Assessment comments: cares for grandaughter, says she has tried everything got quit smoking. Does not want to participate in the [facility] Quitline. Wants to by hypnotized.     INDIVIDUAL DIABETES EDUCATION PLAN:  Nutrition management Medication Chronic complications Goal setting _______________________________________________________________________  Intervention TOPICS COVERED TODAY:   Nutrition management  general guidleins on healthier foods including both fats and carbs Medication  made suggestions to help patient improve on adhering to Pm dose of mediciine Chronic complications  Assessed and discussed foot care and prevention of foot problems Goal setting  Review risk of smoking and offered smoking cessation  PATIENTS GOALS/PLAN (copy and paste in patient instructions so patient receives a copy): 1.  Learning Objective:       State ways to cut down on less healthy fat  2.  Behavioral Objective:         Nutrition: To improve blood glucose control I will eat healthier sources of fats Sometimes 25% Medications: To improve blood glucose levels, I will take my medication as prescribed Half of the time 50% Problem Solving: To improve my blood glucose control, I will try ot reduce smokin gto 10 cigs per day by keeping straws nearby or cutting cigs in half  Sometimes 25% Reducing Risk: To decrease the risk for complications, I will stop smoking and do foot check daily  Half of the time 50%  Personalized Follow-Up Plan for Ongoing Self Management Support:  Doctor's Office, friends, family and CDE visits ______________________________________________________________________   Outcomes Expected outcomes: Demonstrated interest in learning.Expect positive changes in lifestyle. Self-care Barriers: Lack of material resources Education material provided: healthier fats- Time for an oil change handout  Patient to contact team via Phone if problems or questions. Time in: 1400     Time out: 1500 Future DSMT - 3-4 months   Montell Leopard, Lupita Leash

## 2011-11-22 NOTE — Assessment & Plan Note (Addendum)
New onset headache in a 56 year old woman, mostly unilateral, worse with lying down better with sitting up raises some  red flags. This may just be sinus headache given she has some URTI but has been going on for too long to be totally because of sinusitis. Her hot flashes may be contributing to it. The history is atypical for migraine. She does not have any cluster symptoms . The type of headache is not tension type .  will get a noncontrast CT head Continue ibuprofen as needed for pain and to then. No vision changes in past 5 months and the headaches started and now worsening course makes temporal arteritis unlikely We'll also advise her to get checked by her ophthalmologist Followup in 2 weeks if not better. Otherwise follow up in one to 2 months with her PCP

## 2011-11-29 ENCOUNTER — Other Ambulatory Visit: Payer: Self-pay | Admitting: Internal Medicine

## 2011-12-06 ENCOUNTER — Ambulatory Visit (HOSPITAL_COMMUNITY): Admission: RE | Admit: 2011-12-06 | Payer: Medicaid Other | Source: Ambulatory Visit

## 2011-12-11 ENCOUNTER — Ambulatory Visit (HOSPITAL_COMMUNITY): Admission: RE | Admit: 2011-12-11 | Payer: Medicaid Other | Source: Ambulatory Visit

## 2011-12-11 DIAGNOSIS — Z0000 Encounter for general adult medical examination without abnormal findings: Secondary | ICD-10-CM | POA: Insufficient documentation

## 2011-12-11 NOTE — Assessment & Plan Note (Addendum)
Reviewed proper sleep hygiene and the importance of maintaining good sleep hygiene.  Will try trazodone for symptomatic relief and followup on her response to this her next office visit. She is advised to call the clinic if she develops any adverse effects from trazodone.

## 2011-12-11 NOTE — Assessment & Plan Note (Signed)
Last A1c of 9.0.  Patient states she does not take any meds twice a day and usually takes it once in the morning. Advised patient to begin taking medicine twice a day as directed. Will have her followup in 3 months to check a hemoglobin A1c and assess her blood glucose control.

## 2011-12-11 NOTE — Assessment & Plan Note (Signed)
Patient currently not on statin therapy. Will check a lipid panel as well as liver function test today.

## 2011-12-11 NOTE — Assessment & Plan Note (Signed)
Patient declines annual flu vaccination. We'll have her return in one month for annual pelvic exam with Pap smear.

## 2012-01-02 ENCOUNTER — Emergency Department (HOSPITAL_COMMUNITY): Payer: Medicaid Other

## 2012-01-02 ENCOUNTER — Telehealth: Payer: Self-pay | Admitting: *Deleted

## 2012-01-02 ENCOUNTER — Encounter (HOSPITAL_COMMUNITY): Payer: Self-pay | Admitting: *Deleted

## 2012-01-02 ENCOUNTER — Emergency Department (HOSPITAL_COMMUNITY)
Admission: EM | Admit: 2012-01-02 | Discharge: 2012-01-02 | Disposition: A | Discharge status: Home / Self Care | Payer: Medicaid Other | Attending: Emergency Medicine | Admitting: Emergency Medicine

## 2012-01-02 DIAGNOSIS — R0602 Shortness of breath: Secondary | ICD-10-CM | POA: Insufficient documentation

## 2012-01-02 DIAGNOSIS — M542 Cervicalgia: Secondary | ICD-10-CM | POA: Insufficient documentation

## 2012-01-02 NOTE — ED Notes (Signed)
PT is alert and oriented.  Pt is started with cold symptoms on Saturday and now hard to catch breath all the times.    Pt sts right posterior neck pain for a while and has seen eye doctor and was told she needs xray.

## 2012-01-02 NOTE — Telephone Encounter (Signed)
Pt calls and states she awoke this am w/ shortness of breath, states" i just can't catch my breath" she is noted to be short of breath. She states she also has "a funny kinda headache". She is ask to have someone take her to ED or call 911, she is agreeable.

## 2012-01-04 ENCOUNTER — Other Ambulatory Visit: Payer: Self-pay | Admitting: Internal Medicine

## 2012-01-08 ENCOUNTER — Encounter: Payer: Medicaid Other | Admitting: Internal Medicine

## 2012-02-05 ENCOUNTER — Encounter: Payer: Self-pay | Admitting: *Deleted

## 2012-02-05 ENCOUNTER — Encounter: Payer: Medicaid Other | Attending: Internal Medicine | Admitting: *Deleted

## 2012-02-05 VITALS — Ht 66.0 in | Wt 189.3 lb

## 2012-02-05 DIAGNOSIS — E1149 Type 2 diabetes mellitus with other diabetic neurological complication: Secondary | ICD-10-CM | POA: Insufficient documentation

## 2012-02-05 DIAGNOSIS — Z713 Dietary counseling and surveillance: Secondary | ICD-10-CM | POA: Insufficient documentation

## 2012-02-05 NOTE — Progress Notes (Addendum)
  Medical Nutrition Therapy:  Appt start time: 1000 end time:  1100.  Assessment:  Primary concerns today: T2DM.  Bridget Owens is here today for assessment of T2DM. EPIC shows previous appt for similar dx with Norm Parcel, RD on 11/22/11. Pt states they did not talk about any "food stuff" during visit.  Only checks BGs "when I feel bad" and reports 124 mg last night 3 hr PPG. States she likes sweets (honey buns, chocolate, ice cream, etc.) and eats them often. Also consumes 12 oz regular soda and skips lunch daily. Pt seems to have difficulty understanding how T2DM works and does not know how to CHO count. Discussed food labels in detail and amounts of CHO to have during meals and snacks. Will f/u in 6 weeks. Advised pt to contact MD for new DM prescription.  MEDICATIONS: See medication list; Reports she is out of Janumet and does not want to take anymore because it has been taken off the market. Saw this on tv.    DIETARY INTAKE:  Usual eating pattern includes 2 meals and 1-2 snacks per day.  24-hr recall:  B ( AM): Oatmeal (2 pkts), coffee  Snk ( AM): none  L ( PM): none Snk ( PM): small bag of fritos D ( PM): Baked chicken, snap beans OR hamburger on wheat bread, couple of french fries Snk ( PM): Sweet potato Beverages: Regular soda (12 oz day), ginger ale, water  Usual physical activity: None  Estimated energy needs: 1200 calories 135 g carbohydrates  Progress Towards Goal(s):  In progress.   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    Intervention:  Nutrition education. See pt instructions.  Handouts given during visit include:  Living Well with Diabetes - [employer]  [employer]  [employer] Diabetic Cookbook  Monitoring/Evaluation:  Dietary intake, exercise, A1c, BG trends, and body weight in 4-6 week(s).

## 2012-02-05 NOTE — Patient Instructions (Addendum)
PATIENTS GOALS/PLAN (continue from [employer], RD):  1. Behavioral Objective:   Nutrition: To improve blood glucose control I will eat healthier sources of fats   Medications: To improve blood glucose levels, I will take my medication as prescribed   Problem Solving: To improve my blood glucose control, I will try to reduce smoking to 10 cigs per day by keeping straws nearby or cutting cigs in half   Reducing Risk: To decrease the risk for complications, I will stop smoking and do foot check daily   Add these per our visit:  Try Smart Balance cooking spray instead of oil.   Aim for 30 grams of "total carbohydrates" per meal and 15 grams per snack  Walk for 30 min a day or work out at [employer]   Avoid meal skipping, high fat/fried foods, sodas, and sweets.   Follow up in 4-6 weeks - contact me with any questions.  Check blood sugar and take medications daily or as directed.

## 2012-02-06 ENCOUNTER — Telehealth: Payer: Self-pay | Admitting: Dietician

## 2012-02-06 NOTE — Telephone Encounter (Signed)
Heard about the diabetes medicine she is on causes cancer and she doesn't want to take it anymore. Stopped it two days ago. Has an appointment already to discuss with Dr. Arvilla Market on 02/07/12.

## 2012-02-07 ENCOUNTER — Encounter: Payer: Self-pay | Admitting: Internal Medicine

## 2012-02-07 ENCOUNTER — Ambulatory Visit (INDEPENDENT_AMBULATORY_CARE_PROVIDER_SITE_OTHER): Payer: Medicaid Other | Admitting: Internal Medicine

## 2012-02-07 VITALS — BP 149/94 | HR 94 | Temp 97.1°F | Ht 64.0 in | Wt 189.8 lb

## 2012-02-07 DIAGNOSIS — Z1239 Encounter for other screening for malignant neoplasm of breast: Secondary | ICD-10-CM | POA: Insufficient documentation

## 2012-02-07 DIAGNOSIS — K649 Unspecified hemorrhoids: Secondary | ICD-10-CM | POA: Insufficient documentation

## 2012-02-07 DIAGNOSIS — Z1231 Encounter for screening mammogram for malignant neoplasm of breast: Secondary | ICD-10-CM

## 2012-02-07 DIAGNOSIS — K625 Hemorrhage of anus and rectum: Secondary | ICD-10-CM

## 2012-02-07 DIAGNOSIS — E119 Type 2 diabetes mellitus without complications: Secondary | ICD-10-CM

## 2012-02-07 DIAGNOSIS — Z79899 Other long term (current) drug therapy: Secondary | ICD-10-CM

## 2012-02-07 LAB — POCT GLYCOSYLATED HEMOGLOBIN (HGB A1C): Hemoglobin A1C: 7.2

## 2012-02-07 LAB — GLUCOSE, CAPILLARY: Glucose-Capillary: 184 mg/dL — ABNORMAL HIGH (ref 70–99)

## 2012-02-07 MED ORDER — HYDROCORTISONE 2.5 % RE CREA: TOPICAL_CREAM | Freq: Two times a day (BID) | RECTAL | Status: AC

## 2012-02-07 MED ORDER — METFORMIN HCL 500 MG PO TABS: 500.0000 mg | ORAL_TABLET | Freq: Two times a day (BID) | ORAL | Status: DC

## 2012-02-07 NOTE — Assessment & Plan Note (Signed)
Patient is very concerned about continuing with Janumet after seeing on TV that this medication is associated with cancer.  Despite reassurance, she wishes to transition to a different medication. Will discontinue Janument and start her on metformin 500 mg 1 tab by mouth twice a day.  A1c today is at goal at 7.2.  WIll continue to monitor

## 2012-02-07 NOTE — Patient Instructions (Signed)
Schedule a followup appointment as needed. Will will set you up for a  Mammogram screen for breast cancer. Stopped taking her Jenument and start taking metformin. Metformin is a new medicine for your diabetes. Take one pill twice a day. You have been prescribed a new cream for treatment of  hemorrhoids. Use as directed. If you have any more rectal bleeding or rectal bleeding that does not stop come back to the clinic or go immediately to the emergency room.

## 2012-02-07 NOTE — Assessment & Plan Note (Signed)
Patient is due for screening mammography. We'll refer her for this today. She states she prefers to go to the breast center and at [employer].

## 2012-02-07 NOTE — Progress Notes (Signed)
Patient ID: Bridget Owens, female   DOB: 18-Sep-1956, 56 y.o.   MRN: [id number]  Subjective:     HPI:  Pt is here today for routine f/u.  She states she is feeling well.  She states she does not want to take Janumet b/c she saw on TV that it caused cancer.  She wishes to change to a different medication.    Rectal bleeding: pt reports one episodes of bleeding with a bowel movement 2 weeks ago.  She states she was not constipated at that time.  She reports associated burning and itching with the bleeding.  She has has hemorrhoids since the birth of her son but she states "they have never bleed like that before."  She did not pass out, feel dizzy, DOD, DOE, or have any c/p.  She was primary concerned about rectal pain.  She notes the pain improved with sitting in a hot tub with epsom salts and OTC suppositories.   Review of Systems Constitutional: Negative for fever, chills, diaphoresis, activity change, appetite change, fatigue and unexpected weight change.  HENT: Negative for hearing loss, congestion and neck stiffness.   Eyes: Negative for photophobia, pain and visual disturbance.  Respiratory: Negative for cough, chest tightness, shortness of breath and wheezing.   Cardiovascular: Negative for chest pain and palpitations.  Gastrointestinal: Negative for abdominal pain, N/V  Genitourinary: Negative for dysuria, hematuria and difficulty urinating.  Musculoskeletal: Negative for joint swelling.  Neurological: Negative for dizziness, syncope, speech difficulty, weakness, numbness and headaches.     Objective:   Physical Exam VItal signs reviewed GEN: No apparent distress.  Alert and oriented x 3.  Pleasant, conversant, and cooperative to exam. HEENT: head is autraumatic and normocephalic.  Neck is supple without palpable masses or lymphadenopathy.  No JVD or carotid bruits.  Vision intact.  EOMI.  PERRLA.  Sclerae anicteric.  Conjunctivae without pallor or injection. Mucous membranes are moist.   Oropharynx is without erythema, exudates, or other abnormal lesions. RESP:  Lungs are clear to ascultation bilaterally with good air movement.  No wheezes, ronchi, or rubs. CARDIOVASCULAR: regular rate, normal rhythm.  Clear S1, S2, no murmurs, gallops, or rubs. ABDOMEN: soft, non-tender, non-distended.  Bowels sounds present in all quadrants and normoactive.  No palpable masses. Rectal: external hemorrhoids noted; somewhat tender to palpation.  No palpable abnormalities in rectal vault on DRE.  Brown stool noted.  FOBT negative.  No evidence of anal fissure or other abnormal lesion EXT: warm and dry.  Peripheral pulses equal, intact, and +2 globally.  No clubbing or cyanosis.   NEURO: CN II-XII grossly intact.  Muscle strength +5/5 in bilateral upper and lower extremities.  Sensation is grossly intact.  No focal deficit.     Assessment/Plan:

## 2012-02-07 NOTE — Assessment & Plan Note (Signed)
Patient reports one episode of bright rectal bleeding approximately 2 weeks ago with small amount of blood. This has not recurred. She had colonoscopy in 2009 that showed small polyps and is otherwise within normal limits. On exam today she has prolapsed hemorrhoids that are mildly tender, normal brown stool noted in rectal vault no evidence of frank blood; occult blood test performed in office was negative. There is no evidence of anal fissure. I believe her rectal bleeding is the result of hemorrhoidal irritation following increased loose bowel movements/diarrhea subsequent to over indulgence with lactose.  She is to avoid eating significant amounts of milk, cheese, and umbilicus containing items as she is likely lactose intolerant. Also prescribed hydrocortisone rectal cream today for relief of hemorrhoidal pain and itching. She is advised to return to clinic or to the emergency room should she experience additional rectal bleeding or rectal bleeding that does not stop, is associated with shortness of breath, dizziness, syncope, chest pain, or other concerning symptoms.

## 2012-02-20 ENCOUNTER — Ambulatory Visit (INDEPENDENT_AMBULATORY_CARE_PROVIDER_SITE_OTHER): Payer: Medicaid Other | Admitting: Internal Medicine

## 2012-02-20 ENCOUNTER — Encounter: Payer: Self-pay | Admitting: Internal Medicine

## 2012-02-20 VITALS — BP 137/82 | HR 67 | Temp 97.5°F | Resp 20 | Ht 64.5 in | Wt 188.4 lb

## 2012-02-20 DIAGNOSIS — E119 Type 2 diabetes mellitus without complications: Secondary | ICD-10-CM

## 2012-02-20 DIAGNOSIS — B070 Plantar wart: Secondary | ICD-10-CM

## 2012-02-20 DIAGNOSIS — J019 Acute sinusitis, unspecified: Secondary | ICD-10-CM

## 2012-02-20 DIAGNOSIS — B9689 Other specified bacterial agents as the cause of diseases classified elsewhere: Secondary | ICD-10-CM

## 2012-02-20 LAB — GLUCOSE, CAPILLARY: Glucose-Capillary: 207 mg/dL — ABNORMAL HIGH (ref 70–99)

## 2012-02-20 MED ORDER — FLUCONAZOLE 150 MG PO TABS: 150.0000 mg | ORAL_TABLET | Freq: Once | ORAL | Status: AC

## 2012-02-20 MED ORDER — LORATADINE 10 MG PO TABS: 10.0000 mg | ORAL_TABLET | Freq: Every day | ORAL | Status: DC

## 2012-02-20 MED ORDER — DOXYCYCLINE HYCLATE 100 MG PO TABS: 100.0000 mg | ORAL_TABLET | Freq: Two times a day (BID) | ORAL | Status: AC

## 2012-02-20 NOTE — Progress Notes (Signed)
Patient ID: Bridget Owens, female   DOB: 1956-04-01, 56 y.o.   MRN: [id number]  Subjective:     HPI:  Pt is here today for an acute visit and complaints of productive cough as well as foot pain.  Cough: pt states she caught a cold from her 3 year old granddaughter. She notes her symptoms have been present for more than 10 days.  She is using mucinex that is helping with her cough but not here congestion.  She reports sinus congestion with increased sinus drainage.  She denies a sore throat.  She reports her ears are itching but have not drained any fluid.  She denies fever or shaking chills.  Her cough is productive of white phlegm.  She denies hemoptysis.  She states her appetite is good and denies nausea, vomiting, abdominal pain, or diarrhea.  She notes antibiotics always give her a yeast infection.  Foot pain: pt reports foot pain present for years in her right foot.  She points to a lesion on the bottom of her right foot.  She notes the pain has become so bad it is now interfering with her ability to exercise.  She tried using OTC corn treatment and an OTC ointment from the "chinese shop" with no relief.   Review of Systems Constitutional: Negative for fever, chills, diaphoresis, activity change, appetite change, fatigue and unexpected weight change.  HENT: Negative for hearing loss, congestion and neck stiffness.   Eyes: Negative for photophobia, pain and visual disturbance.  Respiratory: Negative for  chest tightness, shortness of breath and wheezing.   Cardiovascular: Negative for chest pain and palpitations.  Gastrointestinal: Negative for abdominal pain, N/V  Genitourinary: Negative for dysuria, hematuria and difficulty urinating.  Musculoskeletal: Negative for joint swelling.  Neurological: Negative for dizziness, syncope, speech difficulty, weakness, numbness and headaches.     Objective:   Physical Exam VItal signs reviewed GEN: No apparent distress.  Alert and oriented x 3.   Pleasant, conversant, and cooperative to exam. HEENT: head is autraumatic and normocephalic.  Neck is supple without palpable masses or lymphadenopathy.  No JVD.  Vision intact.  EOMI.  PERRLA.  Sclerae anicteric.  Conjunctivae without pallor or injection. Mucous membranes are moist.  Oropharynx is without erythema, exudates, or other abnormal lesions.  Mild amount of cerumen noted in right ear canal; left your canal clear. TMs visualized bilaterally and within normal limits bilaterally. RESP:  Lungs are clear to ascultation bilaterally with good air movement.  No wheezes, ronchi, or rubs. CARDIOVASCULAR: regular rate, normal rhythm.  Clear S1, S2, no murmurs, gallops, or rubs. ABDOMEN: soft, non-tender, non-distended.  Bowels sounds present in all quadrants and normoactive.  No palpable masses. EXT: warm and dry.  Peripheral pulses equal, intact, and +2 globally.  No clubbing or cyanosis.  Plantar wart present on medial aspect plantar aspect of right foot.  Pain reproduced with palpation of plantar wart      Assessment/Plan:

## 2012-02-20 NOTE — Assessment & Plan Note (Addendum)
Patient has a plantar wart on the plantar aspect of her right foot. Pain is reproducible with palpation of the lesion. Patient is experiencing limitation in her ability to handle in exercise as a result of pain from her plantar wart.  Given her underlying diabetes, I am hesitant to pursue treatment in our clinic (we lack the ability to proceed with cryotherapy or other topical irritant) and will instead refer her to podiatry. She states she's been seen by [facility] in the past; will refer her to them today for further evaluation and treatment of her plantar wart.

## 2012-02-20 NOTE — Patient Instructions (Addendum)
You have an appointment Friday May 31 at 9:30am with the foot doctor.  Do not miss this.  They will be able to treat the plantar wart that is causing you foot pain. Doxycycline as an antibiotic to take for your sinus infection. Try over-the-counter nasal saline spray to relieve your sinus congestion. Ask the pharmacist where this is located when you pick up your prescriptions. Fluconazole is a medication for vaginal itching from a yeast infection.  Take one pill after you complete the antibiotics (doxycycline).   Loratadine is a medicine for allergies. This may also help your congestion. It is like Benadryl but does not make you sleepy. Take one pill every morning. Keep taking all of your other medicines as directed If you develop a temperature of greater than 101F, worsening cough, bloody cough, difficulty breathing, chest pain, or other concerning symptoms, return to the clinic or go to the emergency roomSinusitis Sinusitis an infection of the air pockets (sinuses) in your face. This can cause puffiness (swelling). It can also cause drainage from your sinuses.   HOME CARE    Only take medicine as told by your doctor.   Drink enough fluids to keep your pee (urine) clear or pale yellow.   Apply moist heat or ice packs for pain relief.   Use salt (saline) nose sprays. The spray will wet the thick fluid in the nose. This can help the sinuses drain.  GET HELP RIGHT AWAY IF:    You have a fever.   Your baby is older than 3 months with a rectal temperature of 102 F (38.9 C) or higher.   Your baby is 64 months old or younger with a rectal temperature of 100.4 F (38 C) or higher.   The pain gets worse.   You get a very bad headache.   You keep throwing up (vomiting).   Your face gets puffy.  MAKE SURE YOU:    Understand these instructions.   Will watch your condition.   Will get help right away if you are not doing well or get worse.  Document Released: 03/12/2008 Document  Revised: 09/13/2011 Document Reviewed: 03/12/2008 [facility] Patient Information 2012 [place], [place].Plantar Warts Plantar warts are growths on the bottom of your foot. Warts are caused by a germ.   HOME CARE  Soak your foot in warm water. Dry your foot when you are done. Remove the top layer of softened skin, then apply any medicine as told by your doctor.   Remove any bandages daily. File off extra wart tissue. Repeat this as told by your doctor until the wart goes away.   Only use medicine as told by your doctor.   Use a bandage with a hole in it (doughnut bandage) to relieve pain.Put the hole over the wart.   Wear shoes and socks and change them daily.   Keep your foot clean and dry.   Check your feet regularly.   Avoid contact with warts on other people.   Have your warts checked by your doctor.  GET HELP RIGHT AWAY IF: The treated skin becomes red, puffy (swollen), or painful. MAKE SURE YOU:  Understand these instructions.   Will watch your condition.   Will get help right away if you are not doing well or get worse.  Document Released: 10/27/2010 Document Revised: 09/13/2011 Document Reviewed: 10/27/2010 [facility] Patient Information 2012 [place], [place].

## 2012-02-20 NOTE — Assessment & Plan Note (Signed)
Patient has experienced sinus congestion, increased sinus drainage, and cough for greater than 10 days. At this point, it is reasonable to proceed with empiric antibiotics. Will prescribe a seven-day course of doxycycline. Patient notes she frequently acquires vaginal yeast infections while taking antibiotics.  Will prescribe her fluconazole with instructions to take one pill if she develops vaginal itching, increased white discharge, or other symptoms of vaginal yeast infection.  Also encouraged her to use over-the-counter saline nasal spray for relief of congestion.  She currently takes Benadryl for treatment of her seasonal allergies. She states she has never taken Claritin or other nondrowsy antihistamine and admits to drowsiness with Benadryl. Will prescribe her loratadine with instructions to take one pill daily during allergy season; hopefully this will avoid daytime somnolence that she experiences with Benadryl.  Patient is advised to return to clinic if she develops fever, shaking chills, shortness of breath, worsening cough, hemoptysis, dyspnea on exertion, dizziness, syncope, or other concerning complaint.

## 2012-03-06 ENCOUNTER — Other Ambulatory Visit: Payer: Self-pay | Admitting: Internal Medicine

## 2012-03-06 ENCOUNTER — Ambulatory Visit (HOSPITAL_COMMUNITY): Admission: RE | Admit: 2012-03-06 | Payer: Medicaid Other | Source: Ambulatory Visit

## 2012-03-10 ENCOUNTER — Telehealth: Payer: Self-pay | Admitting: *Deleted

## 2012-03-10 DIAGNOSIS — B070 Plantar wart: Secondary | ICD-10-CM

## 2012-03-10 NOTE — Telephone Encounter (Signed)
Pt wants a referral to go back and see Dr Yetta Barre - dermatology for right foot. Saw [facility] and will not go back - wants this area right foot removed. Stanton Kidney Rein Popov RN 03/10/12 4:15PM

## 2012-03-18 ENCOUNTER — Ambulatory Visit: Payer: Medicaid Other | Admitting: *Deleted

## 2012-03-24 ENCOUNTER — Encounter: Payer: Self-pay | Admitting: Internal Medicine

## 2012-03-24 ENCOUNTER — Ambulatory Visit (INDEPENDENT_AMBULATORY_CARE_PROVIDER_SITE_OTHER): Payer: Medicaid Other | Admitting: Internal Medicine

## 2012-03-24 VITALS — BP 155/83 | HR 68 | Temp 98.1°F | Ht 66.0 in | Wt 187.5 lb

## 2012-03-24 DIAGNOSIS — M79605 Pain in left leg: Secondary | ICD-10-CM | POA: Insufficient documentation

## 2012-03-24 DIAGNOSIS — M79609 Pain in unspecified limb: Secondary | ICD-10-CM

## 2012-03-24 DIAGNOSIS — I10 Essential (primary) hypertension: Secondary | ICD-10-CM

## 2012-03-24 DIAGNOSIS — J019 Acute sinusitis, unspecified: Secondary | ICD-10-CM

## 2012-03-24 DIAGNOSIS — B9689 Other specified bacterial agents as the cause of diseases classified elsewhere: Secondary | ICD-10-CM

## 2012-03-24 DIAGNOSIS — E139 Other specified diabetes mellitus without complications: Secondary | ICD-10-CM

## 2012-03-24 LAB — GLUCOSE, CAPILLARY: Glucose-Capillary: 185 mg/dL — ABNORMAL HIGH (ref 70–99)

## 2012-03-24 MED ORDER — LOSARTAN POTASSIUM-HCTZ 100-25 MG PO TABS: 1.0000 | ORAL_TABLET | Freq: Every day | ORAL | Status: DC

## 2012-03-24 MED ORDER — FLUTICASONE PROPIONATE 50 MCG/ACT NA SUSP: 2.0000 | Freq: Every day | NASAL | Status: DC

## 2012-03-24 MED ORDER — LORATADINE 10 MG PO TABS: 10.0000 mg | ORAL_TABLET | Freq: Every day | ORAL | Status: DC

## 2012-03-24 NOTE — Assessment & Plan Note (Signed)
Patient presents with 2 days of very sharp left leg pain without any trauma. The pain is focused over the tibia on the anterior calf roughly 1/3 of the way up the leg. Physical exam is completely normal, though there is some mild tenderness to palpation. Because this pain is relatively new, I told the patient to take some Tylenol or ibuprofen as needed and to see how the pain develops. If the pain persists and/or worsens, we can consider a radiograph of the leg. - RTC in 2 weeks if symptoms persist - Radiograph if symptoms persist - Tylenol/ibuprofen

## 2012-03-24 NOTE — Assessment & Plan Note (Signed)
Patient currently on losartan/HCTZ and atenolol. BP is slightly elevated today, likely secondary to pain and discomfort. There was some question about whether she was taking her combo pill, but after speaking with [facility], it appears that she is. - Continue to follow

## 2012-03-24 NOTE — Assessment & Plan Note (Signed)
Patient presents with continued symptoms including sinus pressure, ear pain, headache, despite taking a course of doxycycline last month. She has not been taking Claritin consistently. She does not appear acutely ill. I think this may be allergies as opposed to persistent bacterial sinusitis. - Flonase - Claritin - Benadryl - RTC if symptoms persist

## 2012-03-24 NOTE — Progress Notes (Signed)
Subjective:     Patient ID: Bridget Owens, female   DOB: 1956/07/12, 56 y.o.   MRN: [id number]  HPI Patient is a 56 year old woman with history of diabetes, hypertension, and recent treatment for sinusitis who presents for continued symptoms.  Patient was treated for sinusitis in the middle of May with a course of doxycycline but has complained of worsening intermittent, achy bilateral ear pain. She also has a pounding headache, occasional dizziness, sinus pressure, occasional sore throat. She denies fevers chills. Her symptoms are relieved with Tylenol and Benadryl. She does feel that after the antibiotics her symptoms improved, but now they're back and worse.  Patient also complains of left lower leg pain on the anterior calf x2 days. The pain is sharp and intermittent without radiation. She denies any trauma. It is as high as a 20 out of 10.  Review of Systems     Objective:   Physical Exam Filed Vitals:   03/24/12 0816  BP: 155/83  Pulse: 68  Temp: 98.1 F (36.7 C)   Gen: NAD, cooperative HEENT: TM's clear b/l with good light reflex, no oropharyngeal erythema, EOMI, no conjunctival injection. CV: RRR MSK: L leg is moderately TTP over anterior shin 1/3 up from ankle.  No visible deformity.  Pulses good, strength intact.    Assessment:         Plan:

## 2012-03-28 ENCOUNTER — Ambulatory Visit (HOSPITAL_COMMUNITY): Payer: Medicaid Other | Attending: Internal Medicine

## 2012-03-30 ENCOUNTER — Emergency Department (HOSPITAL_COMMUNITY)
Admission: EM | Admit: 2012-03-30 | Discharge: 2012-03-30 | Disposition: A | Discharge status: Home / Self Care | Payer: Medicaid Other | Source: Home / Self Care | Attending: Emergency Medicine | Admitting: Emergency Medicine

## 2012-03-30 ENCOUNTER — Encounter (HOSPITAL_COMMUNITY): Payer: Self-pay

## 2012-03-30 DIAGNOSIS — J40 Bronchitis, not specified as acute or chronic: Secondary | ICD-10-CM

## 2012-03-30 MED ORDER — PREDNISONE 10 MG PO TABS: 50.0000 mg | ORAL_TABLET | Freq: Every day | ORAL | Status: DC

## 2012-03-30 MED ORDER — ALBUTEROL SULFATE HFA 108 (90 BASE) MCG/ACT IN AERS: 1.0000 | INHALATION_SPRAY | Freq: Four times a day (QID) | RESPIRATORY_TRACT | Status: DC | PRN

## 2012-03-30 NOTE — ED Notes (Signed)
Pt states she was exposed to a strong cologne yesterday and has had cough and burning in her chest since then.

## 2012-03-30 NOTE — ED Provider Notes (Signed)
History     CSN: [id number]  Arrival date & time 03/30/12  1538   First MD Initiated Contact with Patient 03/30/12 1724      Chief Complaint  Patient presents with  . Cough    (Consider location/radiation/quality/duration/timing/severity/associated sxs/prior treatment) HPI Comments: Pt was around someone wearing strong perfume yesterday, reports coughing and feeling tightness in chest almost immediately that is persisting.  Reports this frequently happens to her with strong odors (perfumes, scented candles, etc).  REquests inhaler and "medicine" to help relieve her "bronchitis" as this is what always has helped her in the past.   Patient is a 56 y.o. female presenting with cough. The history is provided by the patient.  Cough This is a new problem. The current episode started yesterday. The problem occurs every few minutes. The problem has not changed since onset.The cough is non-productive. There has been no fever. Associated symptoms include chest pain, shortness of breath and wheezing. Pertinent negatives include no chills, no rhinorrhea and no sore throat. Associated symptoms comments: Pain in chest only with coughing. She has tried nothing for the symptoms. She is a smoker. Her past medical history is significant for bronchitis.    Past Medical History  Diagnosis Date  . Bell's palsy 01/2009    Left sided  . Type II diabetes mellitus   . Hypertension   . Hyperlipidemia   . Allergic rhinitis   . Intolerance, drug     Leg cramps on Lipitor  . GERD (gastroesophageal reflux disease)   . Uterine fibroid   . Chest pain     Exercise stress test negative 6/07  . Postmenopausal symptoms     Hot flashes, vaginal dryness, iritability, difficulty sleeping. as of 2005.  Marland Kitchen Insomnia   . External hemorrhoid   . Lipoma 12/11    Posterior neck.   . Osteoarthritis     Carpometacarpal joint of right thumb  . Trigger finger     Right thumb  . Sebaceous cyst of breast     Has been  refered to derm.  . Adenomatous colon polyp 6/09    Resected on colonoscopy, no high grade dysplasia.   . Hypertensive retinopathy     Followed by Dr. Elmer Picker.  . Diabetic peripheral neuropathy   . Postmenopausal bleeding 9/05    Endometrial biopsy showed  FOCAL TUBAL METAPLASIA     History reviewed. No pertinent past surgical history.  Family History  Problem Relation Age of Onset  . Pneumonia Mother   . Diabetes Mother   . Early death Father   . Diabetes Sister   . Diabetes Brother   . Diabetes Maternal Grandmother     History  Substance Use Topics  . Smoking status: Current Everyday Smoker -- 0.5 packs/day for 30 years    Types: Cigarettes    Last Attempt to Quit: 11/28/2010  . Smokeless tobacco: Former Neurosurgeon    Quit date: 11/28/2010   Comment: trying to quit   . Alcohol Use: No    OB History    Grav Para Term Preterm Abortions TAB SAB Ect Mult Living                  Review of Systems  Constitutional: Negative for fever and chills.  HENT: Negative for congestion, sore throat, rhinorrhea and postnasal drip.   Respiratory: Positive for cough, chest tightness, shortness of breath and wheezing. Negative for stridor.   Cardiovascular: Positive for chest pain.    Allergies  Codeine and  Hydrocodone-acetaminophen  Home Medications   Current Outpatient Rx  Name Route Sig Dispense Refill  . ALBUTEROL SULFATE HFA 108 (90 BASE) MCG/ACT IN AERS Inhalation Inhale 1-2 puffs into the lungs every 6 (six) hours as needed for wheezing. 1 Inhaler 0  . ASPIRIN 81 MG PO TBEC Oral Take 81 mg by mouth daily.      . ATENOLOL 25 MG PO TABS  TAKE ONE TABLET BY MOUTH ONE TIME DAILY 30 tablet 3  . FLUTICASONE PROPIONATE 50 MCG/ACT NA SUSP Nasal Place 2 sprays into the nose daily. 16 g 2  . GLUCOSE BLOOD VI STRP  Use to check your blood sugar 1 - 2 times a day     . LANCETS MISC  Use to test blood sugar 1 -2 times a day     . LORATADINE 10 MG PO TABS Oral Take 1 tablet (10 mg total) by  mouth daily. 30 tablet 2  . LOSARTAN POTASSIUM-HCTZ 100-25 MG PO TABS Oral Take 1 tablet by mouth daily. 30 tablet 11  . METFORMIN HCL 500 MG PO TABS Oral Take 1 tablet (500 mg total) by mouth 2 (two) times daily with a meal. 60 tablet 6  . ONDANSETRON HCL 4 MG PO TABS Oral Take 4 mg by mouth daily as needed. For nausea    . PRAVASTATIN SODIUM 20 MG PO TABS Oral Take 20 mg by mouth daily.    Marland Kitchen PRAVASTATIN SODIUM 20 MG PO TABS  TAKE ONE TABLET BY MOUTH ONE TIME DAILY 30 tablet 3  . PREDNISONE 10 MG PO TABS Oral Take 5 tablets (50 mg total) by mouth daily. 15 tablet 0  . ZOLPIDEM TARTRATE ER 12.5 MG PO TBCR Oral Take 12.5 mg by mouth at bedtime as needed. For sleep      BP 170/73  Pulse 68  Temp 98 F (36.7 C) (Oral)  Resp 20  SpO2 98%  Physical Exam  Constitutional: She appears well-developed and well-nourished. No distress.  Cardiovascular: Normal rate and regular rhythm.   Pulmonary/Chest: Effort normal and breath sounds normal. She has no wheezes. She exhibits tenderness.      ED Course  Procedures (including critical care time)  Labs Reviewed - No data to display No results found.   1. Bronchitis       MDM  Although pt exam normal, pt feels strongly she is experiencing bronchitis sx.  Will tx, pt to seek f/u with pcp if sx worsen or do not improve.         Cathlyn Parsons, NP 03/30/12 2214

## 2012-03-30 NOTE — Discharge Instructions (Signed)
Bronchitis Bronchitis is a problem of the air tubes leading to your lungs. This problem makes it hard for air to get in and out of the lungs. You may cough a lot because your air tubes are narrow. Going without care can cause lasting (chronic) bronchitis. HOME CARE   Drink enough fluids to keep your pee (urine) clear or pale yellow.   Use a cool mist humidifier.   Quit smoking if you smoke. If you keep smoking, the bronchitis might not get better.   Only take medicine as told by your doctor.  GET HELP RIGHT AWAY IF:   Coughing keeps you awake.   You start to wheeze.   You become more sick or weak.   You have a hard time breathing or get short of breath.   You cough up blood.   Coughing lasts more than 2 weeks.   You have a fever.   Your baby is older than 3 months with a rectal temperature of 102 F (38.9 C) or higher.   Your baby is 3 months old or younger with a rectal temperature of 100.4 F (38 C) or higher.  MAKE SURE YOU:  Understand these instructions.   Will watch your condition.   Will get help right away if you are not doing well or get worse.  Document Released: 03/12/2008 Document Revised: 09/13/2011 Document Reviewed: 08/26/2009 [employer] Patient Information 2012 [employer]. 

## 2012-04-01 ENCOUNTER — Encounter: Payer: Medicaid Other | Admitting: Internal Medicine

## 2012-04-01 ENCOUNTER — Ambulatory Visit: Payer: Medicaid Other | Admitting: Internal Medicine

## 2012-04-02 NOTE — ED Provider Notes (Signed)
Medical screening examination/treatment/procedure(s) were performed by non-physician practitioner and as supervising physician I was immediately available for consultation/collaboration.  Luiz Blare MD   Luiz Blare, MD 04/02/12 [phone]

## 2012-05-20 ENCOUNTER — Encounter: Payer: Self-pay | Admitting: Internal Medicine

## 2012-06-12 ENCOUNTER — Telehealth: Payer: Self-pay | Admitting: *Deleted

## 2012-06-12 NOTE — Telephone Encounter (Signed)
Call from patient about Podiatry.  RTC to pt message left that Clinics had returned her call.  Angelina Ok, RN 06/12/2012 12:23 PM.

## 2012-06-23 ENCOUNTER — Telehealth: Payer: Self-pay | Admitting: Dietician

## 2012-06-23 DIAGNOSIS — E119 Type 2 diabetes mellitus without complications: Secondary | ICD-10-CM

## 2012-06-23 NOTE — Telephone Encounter (Signed)
Dropped meter and broke it. Needs a new one sent to [facility] on [address]. Also requested diabetes follow up appointment .  Requested front office call patient with appointment

## 2012-06-25 MED ORDER — GLUCOSE BLOOD VI STRP: ORAL_STRIP | Status: DC

## 2012-06-25 MED ORDER — ACCU-CHEK NANO SMARTVIEW W/DEVICE KIT: 1.0000 | PACK | Freq: Two times a day (BID) | Status: DC

## 2012-06-25 MED ORDER — ACCU-CHEK FASTCLIX LANCETS MISC: 1.0000 | Freq: Two times a day (BID) | Status: DC

## 2012-06-26 ENCOUNTER — Other Ambulatory Visit: Payer: Self-pay | Admitting: Dietician

## 2012-06-26 NOTE — Telephone Encounter (Signed)
Patient called to confirm her prescription for a new meter had been sent.

## 2012-07-01 ENCOUNTER — Encounter: Payer: Self-pay | Admitting: Dietician

## 2012-07-01 ENCOUNTER — Ambulatory Visit (INDEPENDENT_AMBULATORY_CARE_PROVIDER_SITE_OTHER): Payer: Medicaid Other | Admitting: Dietician

## 2012-07-01 ENCOUNTER — Ambulatory Visit (INDEPENDENT_AMBULATORY_CARE_PROVIDER_SITE_OTHER): Payer: Medicaid Other | Admitting: Internal Medicine

## 2012-07-01 ENCOUNTER — Encounter: Payer: Self-pay | Admitting: Internal Medicine

## 2012-07-01 VITALS — BP 167/96 | HR 73 | Temp 97.6°F | Ht 66.0 in | Wt 176.9 lb

## 2012-07-01 DIAGNOSIS — E119 Type 2 diabetes mellitus without complications: Secondary | ICD-10-CM

## 2012-07-01 DIAGNOSIS — Z79899 Other long term (current) drug therapy: Secondary | ICD-10-CM

## 2012-07-01 DIAGNOSIS — Z0000 Encounter for general adult medical examination without abnormal findings: Secondary | ICD-10-CM

## 2012-07-01 DIAGNOSIS — I10 Essential (primary) hypertension: Secondary | ICD-10-CM

## 2012-07-01 DIAGNOSIS — E785 Hyperlipidemia, unspecified: Secondary | ICD-10-CM

## 2012-07-01 DIAGNOSIS — Z1239 Encounter for other screening for malignant neoplasm of breast: Secondary | ICD-10-CM

## 2012-07-01 LAB — GLUCOSE, CAPILLARY: Glucose-Capillary: 319 mg/dL — ABNORMAL HIGH (ref 70–99)

## 2012-07-01 LAB — POCT GLYCOSYLATED HEMOGLOBIN (HGB A1C): Hemoglobin A1C: 11.2

## 2012-07-01 MED ORDER — CHLORTHALIDONE 25 MG PO TABS: 25.0000 mg | ORAL_TABLET | Freq: Every day | ORAL | Status: DC

## 2012-07-01 MED ORDER — GLIPIZIDE 5 MG PO TABS: 10.0000 mg | ORAL_TABLET | Freq: Once | ORAL | Status: DC

## 2012-07-01 MED ORDER — LOSARTAN POTASSIUM 100 MG PO TABS: 100.0000 mg | ORAL_TABLET | Freq: Every day | ORAL | Status: DC

## 2012-07-01 MED ORDER — METFORMIN HCL ER (OSM) 1000 MG PO TB24: ORAL_TABLET | ORAL | Status: DC

## 2012-07-01 NOTE — Assessment & Plan Note (Signed)
Lab Results  Component Value Date   NA 142 11/09/2011   K 3.9 11/09/2011   CL 104 11/09/2011   CO2 29 11/09/2011   BUN 18 11/09/2011   CREATININE 0.71 11/09/2011   CREATININE 0.75 03/24/2011    BP Readings from Last 3 Encounters:  07/01/12 167/96  03/30/12 170/73  03/24/12 155/83    Assessment: Hypertension control:  moderately elevated  Progress toward goals:  deteriorated Barriers to meeting goals:  nonadherence to medications and lack of understanding of disease management  Plan: Hypertension treatment:  D/C Hyzaar and change to Losartan 100mg  qd and Chlorthalidone 25mg  qd. Continue Atenolol 25 mg.

## 2012-07-01 NOTE — Progress Notes (Signed)
Diabetes Self-Management Training (DSMT)  Follow up 1 Visit  07/01/2012 Ms. Chilton Greathouse, identified by name and date of birth, is a 56 y.o. female with Type 2 Diabetes. Year of diabetes diagnosis: 2000  ASSESSMENT Patient concerns are Healthy Lifestyle and Glycemic control.  Lab Results  Component Value Date   LDLCALC 111* 11/09/2011   Lab Results  Component Value Date   HGBA1C 11.2 07/01/2012   Labs reviewed.  Patients belief/attitude about diabetes: Diabetes can be controlled. Self foot exams daily: Yes Diabetes Complications: Neuropathy and Retinopathy Support systems: family- daughter who works in dietary at [employer] needs:Does not read well. Daughter helps her- she does not want picture education  Prior DM Education: Yes   Medications See Medications list.  Not taking as prescribed- discussed with physician and pt - decreased pill burden and use extended release formulations to allow her to take them all at one time. She is willing to use insulin   Exercise Plan Doing walking for 30 minutesa day.   Self-Monitoring  Monitor: accu-chek Frequency of testing: never got meter from pharmacy-not checking Hyperglycemia: yes Hypoglycemia: No   Meal Planning Some knowledge and Interested in improving- says she is eating less fried foods, only sips of soda, no juice sweet tea or sports drinks, uses artifical sweetener in tea.    Assessment comments: smoking ~ 1/2 pack per day. Does not want discuss today and not ready to quit.    INDIVIDUAL DIABETES EDUCATION PLAN:  Nutrition management Medication Chronic complications Goal setting _______________________________________________________________________  Intervention TOPICS COVERED TODAY:  Nutrition management  general guidleins on healthier foods including both fats and carbs Medication  made suggestions to help patient improve on adhering to Pm dose of mediciine Chronic complications   offered smoking  cessation  PATIENTS GOALS/PLAN (copy and paste in patient instructions so patient receives a copy): 1.  Learning Objective:       State ways to cut down on less healthy fat  2.  Behavioral Objective:  Nutrition: To improve blood glucose control I will eat healthier sources of fats Most times 75%  Medications: To improve blood glucose levels, I will take my medication as prescribed Half of the time 50%  Reducing Risk: To decrease the risk for complications, I will do foot check daily  Most of the time 75%  Personalized Follow-Up Plan for Ongoing Self Management Support:  Doctor's Office, friends, family- daughter Marylene Land and CDE visits ______________________________________________________________________   Outcomes Expected outcomes: Demonstrated interest in learning.Expect positive changes in lifestyle. Self-care Barriers: Lack of material resources, low literacy Education material provided: Meter instructions in pictures Patient to contact team via Phone if problems or questions. Time in: 1430     Time out: 1500 Future DSMT - 2-3 weeks   Plyler, Lupita Leash

## 2012-07-01 NOTE — Assessment & Plan Note (Signed)
Patient missed mammography appointment in May because she says she feels like her breasts are healthy. Insisted patient's the purpose of screening for malignancy they may not be clinically detectable. She expressed understanding and agreed to followup.

## 2012-07-01 NOTE — Progress Notes (Signed)
Subjective:   Patient ID: Bridget Owens female   DOB: [birth year] 56 y.o.   MRN: [id number]  HPI: [name] is a 56 y.o. with multiple medical problems outlined in past medical history below, who presents for followup of hypertension and diabetes today. Since her last visit in May 2013 she's been on monotherapy with metformin 500 mg twice a day for diabetes. She reports that she's lost her glucometer and thinks her daughter may be in it. She has not been checking her blood sugars regularly at home. She denies any signs or symptoms of hypoglycemia including palpitations, sweating, or dizziness. Her prior HbA1c in May was 7.2. She receives regular ophthalmologic examinations and has been diagnosed with mild hypertensive retinopathy but no evidence of diabetic retinopathy. In regards to her blood pressure, she reports compliance with Hyzaar and atenolol therapy. She has occasional headaches but denies any recent visual changes. In regards to health maintenance, patient had screening colonoscopy in 2009 which showed adenomatous polyps with no dysplasia. These polyps are resected and GI recommended followup with repeat colonoscopy in 3 months. Per chart review and discussions with the patient it seems like she did not followup. She denies any current rectal bleeding. Smokes 1/2 ppd. Says she is not ready to quit or try NRT at this time.   Past Medical History  Diagnosis Date  . Bell's palsy 01/2009    Left sided  . Type II diabetes mellitus   . Hypertension   . Hyperlipidemia   . Allergic rhinitis   . Intolerance, drug     Leg cramps on Lipitor  . GERD (gastroesophageal reflux disease)   . Uterine fibroid   . Chest pain     Exercise stress test negative 6/07  . Postmenopausal symptoms     Hot flashes, vaginal dryness, iritability, difficulty sleeping. as of 2005.  Marland Kitchen Insomnia   . External hemorrhoid   . Lipoma 12/11    Posterior neck.   . Osteoarthritis     Carpometacarpal joint of  right thumb  . Trigger finger     Right thumb  . Sebaceous cyst of breast     Has been refered to derm.  . Adenomatous colon polyp 6/09    Resected on colonoscopy, no high grade dysplasia.   . Hypertensive retinopathy     Followed by Dr. Elmer Picker.  . Diabetic peripheral neuropathy   . Postmenopausal bleeding 9/05    Endometrial biopsy showed  FOCAL TUBAL METAPLASIA    Current Outpatient Prescriptions  Medication Sig Dispense Refill  . ACCU-CHEK FASTCLIX LANCETS MISC 1 each by Does not apply route 2 (two) times daily. Dx code- 250.00  102 each  5  . albuterol (PROVENTIL HFA;VENTOLIN HFA) 108 (90 BASE) MCG/ACT inhaler Inhale 1-2 puffs into the lungs every 6 (six) hours as needed for wheezing.  1 Inhaler  0  . aspirin 81 MG EC tablet Take 81 mg by mouth daily.        Marland Kitchen atenolol (TENORMIN) 25 MG tablet TAKE ONE TABLET BY MOUTH ONE TIME DAILY  30 tablet  3  . Blood Glucose Monitoring Suppl (ACCU-CHEK NANO SMARTVIEW) W/DEVICE KIT 1 each by Does not apply route 2 (two) times daily. Dx code 250.00  1 kit  0  . chlorthalidone (HYGROTON) 25 MG tablet Take 1 tablet (25 mg total) by mouth daily.  30 tablet  3  . fluticasone (FLONASE) 50 MCG/ACT nasal spray Place 2 sprays into the nose daily.  16 g  2  . glipiZIDE (GLUCOTROL) 5 MG tablet Take 2 tablets (10 mg total) by mouth once.  60 tablet  11  . glucose blood (ACCU-CHEK SMARTVIEW) test strip Check blood sugar as instructed 2x a day. Dx code 250.00  100 each  6  . loratadine (CLARITIN) 10 MG tablet Take 1 tablet (10 mg total) by mouth daily.  30 tablet  2  . losartan (COZAAR) 100 MG tablet Take 1 tablet (100 mg total) by mouth daily.  30 tablet  11  . metformin (GLUCOPHAGE XR) 1000 MG (OSM) 24 hr tablet Take 2 tablets daily with breakfast.  60 tablet  11  . ondansetron (ZOFRAN) 4 MG tablet Take 4 mg by mouth daily as needed. For nausea      . pravastatin (PRAVACHOL) 20 MG tablet Take 20 mg by mouth daily.      . pravastatin (PRAVACHOL) 20 MG tablet  TAKE ONE TABLET BY MOUTH ONE TIME DAILY  30 tablet  3  . predniSONE (DELTASONE) 10 MG tablet Take 5 tablets (50 mg total) by mouth daily.  15 tablet  0  . zolpidem (AMBIEN CR) 12.5 MG CR tablet Take 12.5 mg by mouth at bedtime as needed. For sleep      . DISCONTD: metFORMIN (GLUCOPHAGE) 500 MG tablet Take 1 tablet (500 mg total) by mouth 2 (two) times daily with a meal.  60 tablet  6  . DISCONTD: pantoprazole (PROTONIX) 20 MG tablet Take 1 tablet (20 mg total) by mouth daily.  30 tablet  0   Family History  Problem Relation Age of Onset  . Pneumonia Mother   . Diabetes Mother   . Early death Father   . Diabetes Sister   . Diabetes Brother   . Diabetes Maternal Grandmother    History   Social History  . Marital Status: Single    Spouse Name: N/A    Number of Children: N/A  . Years of Education: N/A   Social History Main Topics  . Smoking status: Current Every Day Smoker -- 0.5 packs/day for 30 years    Types: Cigarettes    Last Attempt to Quit: 11/28/2010  . Smokeless tobacco: Former Neurosurgeon    Quit date: 11/28/2010   Comment: trying to quit .  Doing a little less now.  . Alcohol Use: No  . Drug Use: No  . Sexually Active: None   Other Topics Concern  . None   Social History Narrative  . None   Review of Systems: Constitutional: Denies fever, chills, diaphoresis, appetite change and fatigue.  HEENT: + improvement of rhinorrhea and watery eyes since starting claritin. Denies photophobia, eye pain, redness, hearing loss, ear pain,  Respiratory: Denies SOB, DOE, cough, chest tightness,  and wheezing.   Cardiovascular: Denies chest pain, palpitations and leg swelling.  Gastrointestinal: Denies nausea, vomiting, abdominal pain, diarrhea, constipation, blood in stool and abdominal distention.  Genitourinary: Denies dysuria, urgency, frequency, hematuria, flank pain and difficulty urinating.  Musculoskeletal: Denies myalgias,  joint swelling, or gait problem.  Skin: Denies rash    Neurological: Denies dizziness, seizures, syncope Hematological: Denies adenopathy. Psychiatric/Behavioral: Denies mood changes  Objective:  Physical Exam: Filed Vitals:   07/01/12 1400  BP: 167/96  Pulse: 73  Temp: 97.6 F (36.4 C)  TempSrc: Oral  Height: 5\' 6"  (1.676 m)  Weight: 176 lb 14.4 oz (80.241 kg)  SpO2: 98%   Constitutional: Vital signs reviewed.  Patient is a well-developed and well-nourished female in no acute distress and  cooperative with exam. Alert and oriented x3.  Head: Normocephalic and atraumatic Ear: TM normal bilaterally Mouth: no erythema or exudates, MMM Eyes: PERRL, EOMI, No scleral icterus.  Neck: Supple, Trachea midline normal ROM, No JVD, mass, thyromegaly, or carotid bruit present.  Cardiovascular: RRR, S1 normal, S2 normal, no MRG, pulses symmetric and intact bilaterally Pulmonary/Chest: CTAB, no wheezes, rales, or rhonchi Abdominal: Soft. Non-tender, non-distended, bowel sounds are normal, no masses, organomegaly, or guarding present.  GU: no CVA tenderness Musculoskeletal: No joint deformities, erythema, or stiffness, ROM full and no nontender Hematology: no cervical LAD.  Neurological: A&O x3, Strength is normal and symmetric bilaterally, cranial nerve II-XII are grossly intact, no focal motor deficit, sensory intact to light touch bilaterally.  Skin: Warm, dry and intact.    Psychiatric: Normal mood and affect.    Assessment & Plan:

## 2012-07-01 NOTE — Assessment & Plan Note (Signed)
Declines flu shot today, will reconsider at visit in 1 mo.  Pt last had colonoscopy in 2009, notable for adenomatous polyps without dysplasia. Has not followed-up since, however, last GI note recommended repeat colonoscopy after 3 months. Denies rectal bleeding today. - Referral to GI made

## 2012-07-01 NOTE — Patient Instructions (Signed)
1. Please take metformin 1000mg  2 pills in the morning and glipizide one pill each morning. 2. Please take losartan and chlorthalidone for blood pressure once daily each. Continue taking atenolol. 3. Please follow-up with your GI doctor. We Will call you to make an appt.

## 2012-07-01 NOTE — Assessment & Plan Note (Addendum)
Lab Results  Component Value Date   HGBA1C 11.2 07/01/2012   HGBA1C 6.2 07/28/2010   CREATININE 0.71 11/09/2011   CREATININE 0.75 03/24/2011   MICROALBUR 0.43 06/28/2008   MICRALBCREAT 6.8 06/28/2008   CHOL 176 11/09/2011   HDL 47 11/09/2011   TRIG 92 10/13/1094    Last eye exam and foot exam:    Component Value Date/Time   HMDIABEYEEXA no diabetic retinopathy 02/08/2009   HMDIABFOOTEX done 05/01/2010    Assessment: Diabetes control: not controlled Progress toward goals: deteriorated Barriers to meeting goals: nonadherence to medications and lack of understanding of disease management  Plan: Diabetes treatment: Increased pt to 1000mg  ER metformin 2 pills daily and glipizide 10mg  daily.  Refer to: diabetes educator for self-management training and diabetes educator for medical nutrition therapy Instruction/counseling given: reminded to get eye exam, reminded to bring blood glucose meter & log to each visit, reminded to bring medications to each visit and discussed diet   Pt w significant deteriorating of diabetes control over past 3 mo (A1c 7.2-->11.2) on metformin 500mg  BID. Pt does not want to start insulin therapy at this time. Prescribed ER metformin 1000 2 pills daily as well as 10mg  glipizide. Per discussion w Norm Parcel, plan to change glipizide to ER formula at pt's visit next month if she tolerates meds well. Discussed s/s hypoglycemia and instructed pt to call clinic if sx or if BG <70. Encouraged pt to check BG TID and bring meter to visits. Pt agreed. Will f/u in 1 month.   Referral made to CSW to see if pt a candidate for [facility] to help w medication compliance at home.

## 2012-07-01 NOTE — Assessment & Plan Note (Signed)
Patient's LDL 7 months ago was 111, which is above goal for her. However has multiple medical concerns and changes to therapy today, we'll address this problem the next visit.

## 2012-07-02 ENCOUNTER — Other Ambulatory Visit: Payer: Self-pay | Admitting: Internal Medicine

## 2012-07-02 ENCOUNTER — Encounter: Payer: Self-pay | Admitting: Internal Medicine

## 2012-07-02 ENCOUNTER — Telehealth: Payer: Self-pay | Admitting: *Deleted

## 2012-07-02 DIAGNOSIS — E119 Type 2 diabetes mellitus without complications: Secondary | ICD-10-CM

## 2012-07-02 MED ORDER — GLIPIZIDE 5 MG PO TABS: 5.0000 mg | ORAL_TABLET | Freq: Once | ORAL | Status: DC

## 2012-07-02 NOTE — Progress Notes (Deleted)
Bridget Owens, please call this patient and tell her I have changed her glipizide to 5 mg daily (from 10 mg daily). This was sent to her pharmacy as well.

## 2012-07-02 NOTE — Telephone Encounter (Signed)
Call to pt to stop Glipizide 10 mg daily to 5 mg daily.  Pt said that she has taken the 10 mg already for today.  Pt said that she will start 5 mg on tomorrow morning.  Angelina Ok, RN 07/02/2012 3:36 PM

## 2012-07-02 NOTE — Progress Notes (Signed)
I have seen and examined [name] with Dr. Heloise Beecham in clinic today.  She needs to obtain better control of her diabetes mellitus and BP as well. I have edited and changed her glipizide Rx to start at 5 mg PO daily, but can start metformin 1000 mg po bid. She also needs to start chlorthalidone as Rx (change from HCTZ). She was educated on cutting back on salt (she admits she eats a significant amount of canned food). Refer to Diabetes educator.  Follow up in one month. Jonah Blue

## 2012-07-14 ENCOUNTER — Ambulatory Visit (INDEPENDENT_AMBULATORY_CARE_PROVIDER_SITE_OTHER): Payer: Medicaid Other | Admitting: Internal Medicine

## 2012-07-14 ENCOUNTER — Encounter: Payer: Self-pay | Admitting: Internal Medicine

## 2012-07-14 VITALS — BP 138/81 | HR 76 | Temp 98.9°F | Ht 66.0 in | Wt 177.0 lb

## 2012-07-14 DIAGNOSIS — Z0000 Encounter for general adult medical examination without abnormal findings: Secondary | ICD-10-CM

## 2012-07-14 DIAGNOSIS — R109 Unspecified abdominal pain: Secondary | ICD-10-CM

## 2012-07-14 DIAGNOSIS — E119 Type 2 diabetes mellitus without complications: Secondary | ICD-10-CM

## 2012-07-14 DIAGNOSIS — E785 Hyperlipidemia, unspecified: Secondary | ICD-10-CM

## 2012-07-14 LAB — GLUCOSE, CAPILLARY: Glucose-Capillary: 198 mg/dL — ABNORMAL HIGH (ref 70–99)

## 2012-07-14 MED ORDER — GLIPIZIDE 10 MG PO TABS: 10.0000 mg | ORAL_TABLET | Freq: Every day | ORAL | Status: DC

## 2012-07-14 MED ORDER — PRAVASTATIN SODIUM 20 MG PO TABS: 40.0000 mg | ORAL_TABLET | Freq: Every day | ORAL | Status: DC

## 2012-07-14 NOTE — Assessment & Plan Note (Signed)
The patient's abdominal pain is interesting in that it is purely periumbilical, with no pain in any of the 4 quadrants.  Her pain is most likely a side effect of increasing her metformin.  Less likely etiologies include UTI vs GERD.  Unlikely cholecystitis given lack of RUQ pain or murphey's sign.  Unlikely appendicitis given complete lack of RLQ tenderness. -stop metformin x2 days, then take 500 mg XR daily x1 week, then 1000 mg XR daily x1 week, then 1500 mg XR daily until next appointment -checking UA, CMP

## 2012-07-14 NOTE — Assessment & Plan Note (Signed)
-  patient refused flu shot today

## 2012-07-14 NOTE — Patient Instructions (Addendum)
Your abdominal pain is probably due to your increased dose of Metformin.   Stop taking Metformin for 2 days Then start taking 1 tablet per day for 1 week Then take 2 tablets per day for 1 week Then take 3 tablets per day, until you are next seen in clinic. -Increase your Glipizide Medication to 10 mg (2 tablets) once per day  We are also checking a urine sample and some blood work today to make sure there is no other reason for your pain.  We will call you if any of the results are abnormal.  For your cholesterol, we are increasing your Pravastatin medication to a 40 mg tablet.  Take 1 tablet once per day.  Please return for a follow-up visit in 1 months.

## 2012-07-14 NOTE — Progress Notes (Signed)
HPI The patient is a 56 y.o. yo female with a history of DM, COPD, HL, HTN, presenting for an acute visit for abdominal pain  The patient notes a 2-week history of abdominal pain, described as a sharp, periumbilical pain, which comes and goes, 8-9/10.  She believes that the pain started about 2 weeks ago, when the patient's metformin was increased from 1000 mg XR daily to 2000 mg XR daily.  She notes no constipation or diarrhea, with 1 BM per day.  She notes mild chronic nausea, but no increased nausea, and no vomiting.  She has taken nothing for the pain.  The pain radiates to her groin.  The patient went through menopause at age 50.  She has had a tubal ligation.  No other abdominal surgeries (such as appendectomy, cholecystectomy).  No fevers, chills, sick contacts.  No dysuria, flank pain.  The patient has a history of DM.  At her last visit, it was noted that her Hb A1C had increased from 7.2 to 11.2.  The patient's Metformin XR was increased from 1000 mg/day to 2000 mg/day, and the patient was started on glipizide 5 mg/day.  Review of her glucometer report today shows values in a range from 92 to 502, with an average of 237.  CBG today is 198.  The patient reports that these values are somewhat better than they were prior to the medication increase.  She notes no blurry vision, polyuria/polydipsia, AMS, tremulousness, or diaphoresis.  ROS: General: no fevers, chills, changes in weight, changes in appetite Skin: no rash HEENT: no blurry vision, hearing changes, sore throat Pulm: no dyspnea, coughing, wheezing CV: no chest pain, palpitations, shortness of breath Abd: see HPI GU: no dysuria, hematuria, polyuria Ext: no arthralgias, myalgias Neuro: no weakness, numbness, or tingling  Filed Vitals:   07/14/12 1448  BP: 138/81  Pulse: 76  Temp: 98.9 F (37.2 C)    PEX General: alert, cooperative, and in no apparent distress HEENT: pupils equal round and reactive to light, vision grossly  intact, oropharynx clear and non-erythematous  Neck: supple, no lymphadenopathy Lungs: clear to ascultation bilaterally, normal work of respiration, no wheezes, rales, ronchi Heart: regular rate and rhythm, no murmurs, gallops, or rubs Abdomen: soft, mildly tender to umbilical palpation, completely non-tender to RUQ, LUQ, RLQ, and LLQ deep palpation.  No distension, normal bowel sounds, no guarding or rebound tenderness. Extremities: no cyanosis, clubbing, or edema Neurologic: alert & oriented X3, cranial nerves II-XII intact, strength grossly intact, sensation intact to light touch  Current Outpatient Prescriptions on File Prior to Visit  Medication Sig Dispense Refill  . ACCU-CHEK FASTCLIX LANCETS MISC 1 each by Does not apply route 2 (two) times daily. Dx code- 250.00  102 each  5  . albuterol (PROVENTIL HFA;VENTOLIN HFA) 108 (90 BASE) MCG/ACT inhaler Inhale 1-2 puffs into the lungs every 6 (six) hours as needed for wheezing.  1 Inhaler  0  . aspirin 81 MG EC tablet Take 81 mg by mouth daily.        Marland Kitchen atenolol (TENORMIN) 25 MG tablet TAKE ONE TABLET BY MOUTH ONE TIME DAILY  30 tablet  3  . Blood Glucose Monitoring Suppl (ACCU-CHEK NANO SMARTVIEW) W/DEVICE KIT 1 each by Does not apply route 2 (two) times daily. Dx code 250.00  1 kit  0  . chlorthalidone (HYGROTON) 25 MG tablet Take 1 tablet (25 mg total) by mouth daily.  30 tablet  3  . fluticasone (FLONASE) 50 MCG/ACT nasal spray Place  2 sprays into the nose daily.  16 g  2  . glipiZIDE (GLUCOTROL) 5 MG tablet Take 1 tablet (5 mg total) by mouth once.  30 tablet  3  . glucose blood (ACCU-CHEK SMARTVIEW) test strip Check blood sugar as instructed 2x a day. Dx code 250.00  100 each  6  . loratadine (CLARITIN) 10 MG tablet Take 1 tablet (10 mg total) by mouth daily.  30 tablet  2  . losartan (COZAAR) 100 MG tablet Take 1 tablet (100 mg total) by mouth daily.  30 tablet  11  . metformin (GLUCOPHAGE XR) 1000 MG (OSM) 24 hr tablet Take 2 tablets  daily with breakfast.  60 tablet  11  . ondansetron (ZOFRAN) 4 MG tablet Take 4 mg by mouth daily as needed. For nausea      . pravastatin (PRAVACHOL) 20 MG tablet Take 20 mg by mouth daily.      . pravastatin (PRAVACHOL) 20 MG tablet TAKE ONE TABLET BY MOUTH ONE TIME DAILY  30 tablet  3  . predniSONE (DELTASONE) 10 MG tablet Take 5 tablets (50 mg total) by mouth daily.  15 tablet  0  . zolpidem (AMBIEN CR) 12.5 MG CR tablet Take 12.5 mg by mouth at bedtime as needed. For sleep      . DISCONTD: pantoprazole (PROTONIX) 20 MG tablet Take 1 tablet (20 mg total) by mouth daily.  30 tablet  0    Assessment/Plan

## 2012-07-14 NOTE — Assessment & Plan Note (Signed)
Patient has a history of hyperlipidemia.  Last LDL = 111 in Feb 2013. -increase Pravastatin to 40 mg/day

## 2012-07-14 NOTE — Assessment & Plan Note (Signed)
The patient's glucometer continues to show poor glucose control.  Last A1C = 11.2. -metformin use limited by abdominal pain (see plan above); will stop metformin then slowly build back up to 1500 mg XR daily -increase glipizide to 10 mg/day

## 2012-07-15 ENCOUNTER — Telehealth: Payer: Self-pay | Admitting: Licensed Clinical Social Worker

## 2012-07-15 LAB — URINALYSIS, ROUTINE W REFLEX MICROSCOPIC
Bilirubin Urine: NEGATIVE
Glucose, UA: 250 mg/dL — AB
Hgb urine dipstick: NEGATIVE
Ketones, ur: NEGATIVE mg/dL
Leukocytes, UA: NEGATIVE
Nitrite: NEGATIVE
Protein, ur: NEGATIVE mg/dL
Specific Gravity, Urine: 1.013 (ref 1.005–1.030)
Urobilinogen, UA: 0.2 mg/dL (ref 0.0–1.0)
pH: 6.5 (ref 5.0–8.0)

## 2012-07-15 LAB — COMPLETE METABOLIC PANEL WITH GFR
ALT: 16 U/L (ref 0–35)
AST: 13 U/L (ref 0–37)
Albumin: 4.4 g/dL (ref 3.5–5.2)
Alkaline Phosphatase: 60 U/L (ref 39–117)
BUN: 15 mg/dL (ref 6–23)
CO2: 31 mEq/L (ref 19–32)
Calcium: 10.2 mg/dL (ref 8.4–10.5)
Chloride: 98 mEq/L (ref 96–112)
Creat: 0.65 mg/dL (ref 0.50–1.10)
GFR, Est African American: >89 mL/min
GFR, Est Non African American: >89 mL/min
Glucose, Bld: 239 mg/dL — ABNORMAL HIGH (ref 70–99)
Potassium: 3.7 mEq/L (ref 3.5–5.3)
Sodium: 136 mEq/L (ref 135–145)
Total Bilirubin: 0.3 mg/dL (ref 0.3–1.2)
Total Protein: 6.9 g/dL (ref 6.0–8.3)

## 2012-07-15 NOTE — Telephone Encounter (Signed)
[name] was referred to CSW for referral to [facility] for med compliance and disease education/management.  [name] does not have Medicare and would not be eligible for [facility].  However, pt has Medicaid [place] [employer].  CSW placed call to discuss referral to [facility].  CSW discussed services [facility] can provided.  [name] in agreement.  CSW faxed referral to [facility].

## 2012-07-16 ENCOUNTER — Other Ambulatory Visit: Payer: Self-pay | Admitting: Podiatry

## 2012-07-29 ENCOUNTER — Encounter: Payer: Medicaid Other | Admitting: Dietician

## 2012-07-29 ENCOUNTER — Ambulatory Visit: Payer: Medicaid Other | Admitting: Internal Medicine

## 2012-08-06 ENCOUNTER — Encounter: Payer: Self-pay | Admitting: Internal Medicine

## 2012-08-06 NOTE — Progress Notes (Signed)
  This patient is a CHRONIC NO-SHOW PATIENT that has a history of HYPERTENSION.  Please make sure to address hypertension during her next clinic appointment, and intervene as appropriate.    Within the AVS, please incorporate the following smartphrase: .htntips   Pertinent Data: BP Readings from Last 3 Encounters:  07/14/12 138/81  07/01/12 167/96  03/30/12 170/73    BMI: Estimated Body mass index is 28.57 kg/(m^2) as calculated from the following:   Height as of 07/14/12: 5\' 6" (1.676 m).   Weight as of 07/14/12: 177 lb(80.287 kg).  Smoking History: History  Smoking status  . Current Every Day Smoker -- 0.5 packs/day for 30 years  . Types: Cigarettes  . Last Attempt to Quit: 11/28/2010  Smokeless tobacco  . Former Neurosurgeon  . Quit date: 11/28/2010  Comment: trying to quit .  Doing a little less now.    Last Basic Metabolic Panel:    Component Value Date/Time   NA 136 07/14/2012 1543   K 3.7 07/14/2012 1543   CL 98 07/14/2012 1543   CO2 31 07/14/2012 1543   BUN 15 07/14/2012 1543   CREATININE 0.65 07/14/2012 1543   CREATININE 0.75 03/24/2011 2348   GLUCOSE 239* 07/14/2012 1543   CALCIUM 10.2 07/14/2012 1543    Allergies: Allergies  Allergen Reactions  . Codeine Other (See Comments)    unknown  . Hydrocodone-Acetaminophen     REACTION: itching

## 2012-08-12 ENCOUNTER — Other Ambulatory Visit: Payer: Self-pay | Admitting: Internal Medicine

## 2012-08-19 ENCOUNTER — Telehealth: Payer: Self-pay | Admitting: *Deleted

## 2012-08-19 NOTE — Telephone Encounter (Signed)
Pharmacy called - there were no refills or Disp filled in for list of meds for Physician Pharmacy esp Ambien CR. Dr Heloise Beecham aware will hold off for now refill on this med. Talked to Amy Cox at pharmacy [phone] and aware of above. If pt needs reill at latter time - can address problem. Stanton Kidney Dola Lunsford RN 08/19/12 3:45PM

## 2012-08-28 ENCOUNTER — Other Ambulatory Visit: Payer: Self-pay | Admitting: Gastroenterology

## 2012-08-28 ENCOUNTER — Other Ambulatory Visit: Payer: Self-pay | Admitting: *Deleted

## 2012-08-29 ENCOUNTER — Telehealth: Payer: Self-pay | Admitting: *Deleted

## 2012-08-29 DIAGNOSIS — E119 Type 2 diabetes mellitus without complications: Secondary | ICD-10-CM

## 2012-08-29 MED ORDER — ALBUTEROL SULFATE HFA 108 (90 BASE) MCG/ACT IN AERS: 1.0000 | INHALATION_SPRAY | Freq: Four times a day (QID) | RESPIRATORY_TRACT | Status: DC | PRN

## 2012-08-29 MED ORDER — METFORMIN HCL 500 MG PO TABS: 500.0000 mg | ORAL_TABLET | Freq: Two times a day (BID) | ORAL | Status: DC

## 2012-08-29 NOTE — Telephone Encounter (Signed)
Called to pharm 

## 2012-08-29 NOTE — Telephone Encounter (Signed)
I sent a new prescription for metformin 500 mg bid, #60, no refills to [facility].  Her dose needs to be clarified when she is seen in clinic on Monday.

## 2012-08-29 NOTE — Telephone Encounter (Signed)
Pt called to clarify dose of metformin.   She takes Metformin 500 mg twice a day.  I asked her to get her bottle of medication and read it to me. She states she never took a 1000 mg tablet. She is out of meds as of today and needs a refill.  She would like to try 1 1000 mg tablet. She has a scheduled appointment on Monday and will bring her medication bottles. Do you want to send in a new Rx ??

## 2012-08-29 NOTE — Telephone Encounter (Signed)
I agree. Thank you.

## 2012-08-29 NOTE — Telephone Encounter (Signed)
Received a call from pharmacist @ [facility] wanting to verify change in pt's Rx. Pt was on metformin 500 mg bid and was increased to Metformin XR 1000 mg bid.  I read the note from Dr Heloise Beecham on 9/24 and see this was the plan. Do you agree?

## 2012-09-01 ENCOUNTER — Ambulatory Visit (INDEPENDENT_AMBULATORY_CARE_PROVIDER_SITE_OTHER): Payer: Medicaid Other | Admitting: Internal Medicine

## 2012-09-01 ENCOUNTER — Encounter: Payer: Self-pay | Admitting: Internal Medicine

## 2012-09-01 VITALS — BP 143/85 | HR 92 | Temp 97.0°F | Wt 182.2 lb

## 2012-09-01 DIAGNOSIS — J309 Allergic rhinitis, unspecified: Secondary | ICD-10-CM | POA: Insufficient documentation

## 2012-09-01 DIAGNOSIS — M25579 Pain in unspecified ankle and joints of unspecified foot: Secondary | ICD-10-CM

## 2012-09-01 DIAGNOSIS — M79672 Pain in left foot: Secondary | ICD-10-CM | POA: Insufficient documentation

## 2012-09-01 DIAGNOSIS — J069 Acute upper respiratory infection, unspecified: Secondary | ICD-10-CM

## 2012-09-01 DIAGNOSIS — I10 Essential (primary) hypertension: Secondary | ICD-10-CM

## 2012-09-01 MED ORDER — CETIRIZINE HCL 10 MG PO TABS: 10.0000 mg | ORAL_TABLET | Freq: Every day | ORAL | Status: DC

## 2012-09-01 NOTE — Assessment & Plan Note (Signed)
Lab Results  Component Value Date   NA 136 07/14/2012   K 3.7 07/14/2012   CL 98 07/14/2012   CO2 31 07/14/2012   BUN 15 07/14/2012   CREATININE 0.65 07/14/2012   CREATININE 0.75 03/24/2011    BP Readings from Last 3 Encounters:  09/01/12 143/85  07/14/12 138/81  07/01/12 167/96    Assessment: Hypertension control:  mildly elevated  Progress toward goals:  unchanged Barriers to meeting goals:  no barriers identified  Plan: Hypertension treatment:  continue current medications. Continued Atenolol, chlorthalidone and Cozaar.

## 2012-09-01 NOTE — Assessment & Plan Note (Addendum)
Patient with allergic symptoms including rhinitis, sinusitis and ear itch. Has mild ear canal hyperemia. - Patient on Flonase. Advised to continue. -  Started on Zyrtec 10 mg once daily. Patient reports some improvement with Benadryl- but says that it makes her sleepy. No need for imaging or antibiotics. No infectious etiology.

## 2012-09-01 NOTE — Assessment & Plan Note (Signed)
Patient with left plantar skin thickening as per history of present illness and physical exam. Noninfectious etiology. Patient with history of right plantar warts. Refer to podiatry.

## 2012-09-01 NOTE — Progress Notes (Signed)
  Subjective:    Patient ID: Bridget Owens, female    DOB: 01/22/1956, 56 y.o.   MRN: [id number]  HPI patient is a pleasant 56 year woman with DM 2, HTN, COPD and other problems as per problem list who comes the clinic for left foot pain and upper respiratory symptoms.  - She has left foot pain for past one and half week. Foot exam shows left anterior lateral plantar skin thickening- tender to palpation. Does not look like there is any abscess. Patient has history of right plantar wart and had seen podiatrist before. She desires a referral to them.  - Patient describes itching and some pain bilateral ear, dry throat, nasal congestion and pain for past week or 2. She also has cough with whitish sputum. She denies any fever, chills, chest pain, short of breath, sore throat. She also denies any sick contact.     Review of Systems    as per history of present illness. Objective:   Physical Exam  General: NAD HEENT: PERRL, EOMI, no scleral icterus.  Ear: Mild hyperemia bilateral ear canal. No discharge or exudates. Tympanic membrane clear bilaterally. Cardiac: S1, S2, RRR, no rubs, murmurs or gallops Pulm: clear to auscultation bilaterally, moving normal volumes of air Abd: soft, nontender, nondistended, BS present Ext: warm and well perfused, no pedal edema. Left lateral plantar skin thickening with tenderness. No discharge or redness or signs of infection Neuro: alert and oriented X3, cranial nerves II-XII grossly intact       Assessment & Plan:

## 2012-09-01 NOTE — Patient Instructions (Signed)
Please make a followup appointment in 3-4 weeks.  Followup with podiatry for your left foot pain.  Start taking Zyrtec 10 mg one tablet daily for your ear itch, nasal congestion.

## 2012-09-12 ENCOUNTER — Telehealth: Payer: Self-pay | Admitting: Dietician

## 2012-09-12 NOTE — Telephone Encounter (Signed)
Lost meter, told her we will will put a new one for her at front desk that she can pick up.

## 2012-09-17 ENCOUNTER — Ambulatory Visit: Payer: Medicaid Other | Admitting: Internal Medicine

## 2012-09-19 ENCOUNTER — Encounter: Payer: Self-pay | Admitting: Internal Medicine

## 2012-09-19 ENCOUNTER — Ambulatory Visit (INDEPENDENT_AMBULATORY_CARE_PROVIDER_SITE_OTHER): Payer: Medicaid Other | Admitting: Internal Medicine

## 2012-09-19 VITALS — BP 160/87 | HR 88 | Temp 98.5°F | Ht 67.0 in | Wt 184.7 lb

## 2012-09-19 DIAGNOSIS — E119 Type 2 diabetes mellitus without complications: Secondary | ICD-10-CM

## 2012-09-19 DIAGNOSIS — Z79899 Other long term (current) drug therapy: Secondary | ICD-10-CM

## 2012-09-19 DIAGNOSIS — I10 Essential (primary) hypertension: Secondary | ICD-10-CM

## 2012-09-19 DIAGNOSIS — J209 Acute bronchitis, unspecified: Secondary | ICD-10-CM

## 2012-09-19 LAB — POCT GLYCOSYLATED HEMOGLOBIN (HGB A1C): Hemoglobin A1C: 8.6

## 2012-09-19 NOTE — Progress Notes (Signed)
  Subjective:    Patient ID: Bridget Owens, female    DOB: [birth year], 56 y.o.   MRN: [id number]  HPI  Presents with complaints of cough and runny nose since Tuesday (3 days ago).  States that symptoms started after she was at a neighbors house who was burning incense.  Reports some white phlegm, denies fever, chills, shortness of breath or body aches other than headache on exam.  Hx is significant for COPD, uncontrolled diabetes mellitis with HgbA1c today of 8.6, hypertension with bp 160/87 pulse 88 bpm on atenolol 25 mg qd, losartan 100 mg qd, and chlorthalidone 25 mg qd. Smoke 1/2 ppd. Has not used Zyrtec previously prescribed in November for allergic sinusitis nor has she used the Flonase for at least a couple of weeks.  Review of Systems  HENT: Positive for rhinorrhea.   Respiratory: Positive for cough. Negative for shortness of breath, wheezing and stridor.   Cardiovascular: Negative for chest pain.  Musculoskeletal: Positive for arthralgias.       Ribs hurt with coughing  Neurological: Positive for headaches. Negative for light-headedness.       Objective:   Physical Exam  Constitutional: She appears well-developed and well-nourished. No distress.  HENT:  Head: Normocephalic and atraumatic.  Right Ear: Tympanic membrane and ear canal normal.  Left Ear: Tympanic membrane and ear canal normal.  Nose: Mucosal edema and rhinorrhea present. No sinus tenderness or nasal deformity. Right sinus exhibits no maxillary sinus tenderness and no frontal sinus tenderness. Left sinus exhibits no maxillary sinus tenderness and no frontal sinus tenderness.  Mouth/Throat: Oropharynx is clear and moist and mucous membranes are normal. No oropharyngeal exudate, posterior oropharyngeal edema or posterior oropharyngeal erythema.  Eyes: Conjunctivae normal and EOM are normal. Pupils are equal, round, and reactive to light.  Neck: Normal range of motion. Neck supple. No thyromegaly present.  Pulmonary/Chest:  No stridor.  Lymphadenopathy:    She has no cervical adenopathy.          Assessment & Plan:  1. Acute bronchitis: secondary to URI, defer antibiotics, encourage use of flonase and antihistamine -tessalon perles for cough prn

## 2012-09-19 NOTE — Patient Instructions (Addendum)
General Instructions: We have prescribed pills for your cough.  Take as instructed. Your diabetes is improving.   Start taking the Flonase and Claritin again. Continue all of your medications.  Treatment Goals:  Goals (1 Years of Data) as of 09/19/2012          As of Today 09/01/12 07/14/12 07/01/12 03/30/12     Blood Pressure    . Blood Pressure < 140/90  160/87 143/85 138/81 167/96 170/73     Result Component    . HEMOGLOBIN A1C < 7.0  8.6   11.2     . LDL CALC < 100            Progress Toward Treatment Goals:  Treatment Goal 09/19/2012  Hemoglobin A1C improved  Blood pressure deteriorated  Stop smoking smoking the same amount    Self Care Goals & Plans:  Self Care Goal 09/19/2012  Manage my medications take my medicines as prescribed; bring my medications to every visit  Monitor my health keep track of my blood glucose; bring my glucose meter and log to each visit  Eat healthy foods drink diet soda or water instead of juice or soda; eat more vegetables  Stop smoking go to the [employer] ([email or web address])       Care Management & Community Referrals:

## 2012-09-19 NOTE — Assessment & Plan Note (Signed)
Lab Results  Component Value Date   HGBA1C 8.6 09/19/2012   HGBA1C 11.2 07/01/2012   HGBA1C 7.2 02/07/2012     Assessment:  Diabetes control: fair control  Progress toward A1C goal:  improved  Comments:   Plan:  Medications:  continue current medications  Home glucose monitoring:   Frequency:     Timing:    Instruction/counseling given: reminded to bring medications to each visit and discussed sick day management  Educational resources provided: brochure  Self management tools provided:    Other plans: continue metformin 500 mg bid

## 2012-09-19 NOTE — Assessment & Plan Note (Signed)
BP Readings from Last 3 Encounters:  09/19/12 160/87  09/01/12 143/85  07/14/12 138/81    Lab Results  Component Value Date   NA 136 07/14/2012   K 3.7 07/14/2012   CREATININE 0.65 07/14/2012    Assessment:  Blood pressure control: mildly elevated  Progress toward BP goal:  deteriorated  Comments: acute illness today  Plan:  Medications:  continue current medications  Educational resources provided: brochure  Self management tools provided:    Other plans: cont atenolol 25 mg qd, chlorthalidone 25 mg qd, losartan 100 mg qd

## 2012-09-22 ENCOUNTER — Other Ambulatory Visit: Payer: Self-pay | Admitting: Internal Medicine

## 2012-11-18 ENCOUNTER — Ambulatory Visit (INDEPENDENT_AMBULATORY_CARE_PROVIDER_SITE_OTHER): Payer: Medicaid Other | Admitting: Internal Medicine

## 2012-11-18 ENCOUNTER — Encounter: Payer: Self-pay | Admitting: Internal Medicine

## 2012-11-18 VITALS — BP 130/82 | HR 76 | Temp 96.5°F | Ht 67.0 in | Wt 179.7 lb

## 2012-11-18 DIAGNOSIS — H9209 Otalgia, unspecified ear: Secondary | ICD-10-CM

## 2012-11-18 DIAGNOSIS — E119 Type 2 diabetes mellitus without complications: Secondary | ICD-10-CM

## 2012-11-18 DIAGNOSIS — H9201 Otalgia, right ear: Secondary | ICD-10-CM | POA: Insufficient documentation

## 2012-11-18 LAB — GLUCOSE, CAPILLARY: Glucose-Capillary: 208 mg/dL — ABNORMAL HIGH (ref 70–99)

## 2012-11-18 NOTE — Patient Instructions (Addendum)
You can use  Saline solution twice a day. Please call the clinic if your ear pain does not get better by Monday.

## 2012-11-18 NOTE — Assessment & Plan Note (Signed)
Most likely due to viral upper respiratory infection. Recommended to use saline solution twice a day . If no improvement by Monday patient needs to be reevaluated .

## 2012-11-18 NOTE — Progress Notes (Signed)
Subjective:   Patient ID: Bridget Owens female   DOB: [birth year] 57 y.o.   MRN: [id number]  HPI: [name] is a 57 y.o.  Female with PMH significant as outlined below who presented to the clinic for right ear pain. Patient reports that she has been experiencing ear pain since Friday. It is aching in nature, present all day long. Further complains about nasal congestion with clear nasal discharge, Denies any drainage from ears, fever or chills, worsening cough or SOB. Denies any sick contact.     Past Medical History  Diagnosis Date  . Bell's palsy 01/2009    Left sided  . Type II diabetes mellitus   . Hypertension   . Hyperlipidemia   . Allergic rhinitis   . Intolerance, drug     Leg cramps on Lipitor  . GERD (gastroesophageal reflux disease)   . Uterine fibroid   . Chest pain     Exercise stress test negative 6/07  . Postmenopausal symptoms     Hot flashes, vaginal dryness, iritability, difficulty sleeping. as of 2005.  Marland Kitchen Insomnia   . External hemorrhoid   . Lipoma 12/11    Posterior neck.   . Osteoarthritis     Carpometacarpal joint of right thumb  . Trigger finger     Right thumb  . Sebaceous cyst of breast     Has been refered to derm.  . Adenomatous colon polyp 6/09    Resected on colonoscopy, no high grade dysplasia.   . Hypertensive retinopathy     Followed by Dr. Elmer Picker.  . Diabetic peripheral neuropathy   . Postmenopausal bleeding 9/05    Endometrial biopsy showed  FOCAL TUBAL METAPLASIA    Current Outpatient Prescriptions  Medication Sig Dispense Refill  . ACCU-CHEK FASTCLIX LANCETS MISC 1 each by Does not apply route 2 (two) times daily. Dx code- 250.00  102 each  5  . albuterol (PROVENTIL HFA;VENTOLIN HFA) 108 (90 BASE) MCG/ACT inhaler Inhale 1-2 puffs into the lungs every 6 (six) hours as needed for wheezing.  3 Inhaler  1  . aspirin 81 MG EC tablet Take 81 mg by mouth daily.        Marland Kitchen atenolol (TENORMIN) 25 MG tablet TAKE ONE TABLET BY MOUTH ONE TIME  DAILY  30 tablet  3  . Blood Glucose Monitoring Suppl (ACCU-CHEK NANO SMARTVIEW) W/DEVICE KIT 1 each by Does not apply route 2 (two) times daily. Dx code 250.00  1 kit  0  . cetirizine (ZYRTEC) 10 MG tablet Take 1 tablet (10 mg total) by mouth daily.  30 tablet  3  . chlorthalidone (HYGROTON) 25 MG tablet Take 1 tablet (25 mg total) by mouth daily.  30 tablet  3  . fluticasone (FLONASE) 50 MCG/ACT nasal spray Place 2 sprays into the nose daily.  16 g  2  . glipiZIDE (GLUCOTROL) 10 MG tablet Take 1 tablet (10 mg total) by mouth daily.  30 tablet  3  . glucose blood (ACCU-CHEK SMARTVIEW) test strip Check blood sugar as instructed 2x a day. Dx code 250.00  100 each  6  . losartan (COZAAR) 100 MG tablet Take 1 tablet (100 mg total) by mouth daily.  30 tablet  11  . metFORMIN (GLUCOPHAGE) 500 MG tablet TAKE ONE TABLET BY MOUTH TWICE DAILY WITH FOOD  60 tablet  5  . pravastatin (PRAVACHOL) 20 MG tablet Take 2 tablets (40 mg total) by mouth daily.  30 tablet  5  . predniSONE (  DELTASONE) 10 MG tablet Take 5 tablets (50 mg total) by mouth daily.  15 tablet  0  . [DISCONTINUED] pantoprazole (PROTONIX) 20 MG tablet Take 1 tablet (20 mg total) by mouth daily.  30 tablet  0   No current facility-administered medications for this visit.   Family History  Problem Relation Age of Onset  . Pneumonia Mother   . Diabetes Mother   . Early death Father   . Diabetes Sister   . Diabetes Brother   . Diabetes Maternal Grandmother    History   Social History  . Marital Status: Single    Spouse Name: N/A    Number of Children: N/A  . Years of Education: N/A   Social History Main Topics  . Smoking status: Current Every Day Smoker -- 0.30 packs/day for 30 years    Types: Cigarettes    Last Attempt to Quit: 11/28/2010  . Smokeless tobacco: Former Neurosurgeon    Quit date: 11/28/2010     Comment: trying to quit .  Doing a little less now.  . Alcohol Use: No  . Drug Use: No  . Sexually Active: None   Other  Topics Concern  . None   Social History Narrative  . None   Review of Systems: Constitutional: Denies fever, chills, diaphoresis, appetite change and fatigue.  HEENT: Noted  ear pain, congestion,rhinorrhea but denies hearing loss, drainage,  soar throat, trouble swallowing, neck pain, neck stiffness and tinnitus.   Respiratory: no changes of SOB, DOE, cough, chest tightness,  and wheezing.   Cardiovascular: Denies chest pain, palpitations and leg swelling.    Objective:  Physical Exam: Filed Vitals:   11/18/12 0958  BP: 130/82  Pulse: 76  Temp: 96.5 F (35.8 C)  TempSrc: Oral  Height: 5\' 7"  (1.702 m)  Weight: 179 lb 11.2 oz (81.511 kg)  SpO2: 98%   Constitutional: Vital signs reviewed.  Patient is a well-developed and well-nourished female  in no acute distress and cooperative with exam. Alert and oriented x3.  Head: Normocephalic and atraumatic Ear: TM normal bilaterally Mouth: no erythema or exudates, MMM,  Eyes: PERRL, EOMI, conjunctivae normal, No scleral icterus.  Neck: Supple, .  Cardiovascular: RRR, S1 normal, S2 normal, no MRG, pulses symmetric and intact bilaterally Pulmonary/Chest: CTAB, no wheezes, rales, or rhonchi Abdominal: Soft. Non-tender, non-distended, bowel sounds are normal

## 2012-11-21 ENCOUNTER — Emergency Department (HOSPITAL_COMMUNITY)
Admission: EM | Admit: 2012-11-21 | Discharge: 2012-11-21 | Disposition: A | Discharge status: Home / Self Care | Payer: Medicaid Other | Attending: Emergency Medicine | Admitting: Emergency Medicine

## 2012-11-21 ENCOUNTER — Encounter (HOSPITAL_COMMUNITY): Payer: Self-pay | Admitting: Emergency Medicine

## 2012-11-21 DIAGNOSIS — Z8719 Personal history of other diseases of the digestive system: Secondary | ICD-10-CM | POA: Insufficient documentation

## 2012-11-21 DIAGNOSIS — Z7982 Long term (current) use of aspirin: Secondary | ICD-10-CM | POA: Insufficient documentation

## 2012-11-21 DIAGNOSIS — H9209 Otalgia, unspecified ear: Secondary | ICD-10-CM | POA: Insufficient documentation

## 2012-11-21 DIAGNOSIS — I10 Essential (primary) hypertension: Secondary | ICD-10-CM | POA: Insufficient documentation

## 2012-11-21 DIAGNOSIS — E1149 Type 2 diabetes mellitus with other diabetic neurological complication: Secondary | ICD-10-CM | POA: Insufficient documentation

## 2012-11-21 DIAGNOSIS — E785 Hyperlipidemia, unspecified: Secondary | ICD-10-CM | POA: Insufficient documentation

## 2012-11-21 DIAGNOSIS — G909 Disorder of the autonomic nervous system, unspecified: Secondary | ICD-10-CM | POA: Insufficient documentation

## 2012-11-21 DIAGNOSIS — Z860100 Personal history of colon polyps, unspecified: Secondary | ICD-10-CM | POA: Insufficient documentation

## 2012-11-21 DIAGNOSIS — Z8742 Personal history of other diseases of the female genital tract: Secondary | ICD-10-CM | POA: Insufficient documentation

## 2012-11-21 DIAGNOSIS — Z79899 Other long term (current) drug therapy: Secondary | ICD-10-CM | POA: Insufficient documentation

## 2012-11-21 DIAGNOSIS — G510 Bell's palsy: Secondary | ICD-10-CM | POA: Insufficient documentation

## 2012-11-21 DIAGNOSIS — Z8739 Personal history of other diseases of the musculoskeletal system and connective tissue: Secondary | ICD-10-CM | POA: Insufficient documentation

## 2012-11-21 DIAGNOSIS — R29810 Facial weakness: Secondary | ICD-10-CM | POA: Insufficient documentation

## 2012-11-21 MED ORDER — FLUORESCEIN SODIUM 1 MG OP STRP
1.0000 | ORAL_STRIP | Freq: Once | OPHTHALMIC | Status: AC
  Administered 2012-11-21: 1 via OPHTHALMIC
  Filled 2012-11-21: qty 1

## 2012-11-21 MED ORDER — PROPARACAINE HCL 0.5 % OP SOLN
1.0000 [drp] | Freq: Once | OPHTHALMIC | Status: AC
  Administered 2012-11-21: 1 [drp] via OPHTHALMIC
  Filled 2012-11-21: qty 15

## 2012-11-21 MED ORDER — ANTIPYRINE-BENZOCAINE 5.4-1.4 % OT SOLN
3.0000 [drp] | Freq: Once | OTIC | Status: AC
  Administered 2012-11-21: 3 [drp] via OTIC
  Filled 2012-11-21: qty 10

## 2012-11-21 MED ORDER — ACYCLOVIR 400 MG PO TABS: 400.0000 mg | ORAL_TABLET | Freq: Every day | ORAL | Status: DC

## 2012-11-21 MED ORDER — ACYCLOVIR 200 MG PO CAPS
400.0000 mg | ORAL_CAPSULE | Freq: Once | ORAL | Status: AC
  Administered 2012-11-21: 400 mg via ORAL
  Filled 2012-11-21: qty 2

## 2012-11-21 NOTE — ED Notes (Signed)
With glasses:  Right eye 20/50 Left eye 20/30 Both eyes 20/40

## 2012-11-21 NOTE — ED Provider Notes (Signed)
History     CSN: [id number]  Arrival date & time 11/21/12  1637   First MD Initiated Contact with Patient 11/21/12 1743      Chief Complaint  Patient presents with  . Eye Pain  . Otalgia  . Facial Droop    (Consider location/radiation/quality/duration/timing/severity/associated sxs/prior treatment) HPI Comments: Patient presents with complaint of right-sided facial drooping ear pain for the past 3-4 days. Patient has a history of Bell's palsy and thinks that this is the same. Patient saw her primary care physician 4 days ago at the onset of her ear pain. She did not have facial droop at that time. Facial droop began 3 days ago. Patient denies numbness. She denies any involvement of her arms or legs. No trouble walking or confusion. No dizziness. She denies change in taste. Right ear pain is 10 out of 10. She's been using nasal spray prescribed by her primary care physician for this. She denies any fever, nausea, vomiting, or diarrhea. She's not had any vision change. Patient has a foreign body sensation in her right eye. The onset of this condition was acute. The course is constant. Aggravating factors: none. Alleviating factors: none.    Patient is a 57 y.o. female presenting with eye pain and ear pain. The history is provided by the patient.  Eye Pain Pertinent negatives include no chest pain, congestion, fever, headaches, nausea, neck pain, numbness, rash, vomiting or weakness.  Otalgia Associated symptoms: no congestion, no fever, no headaches, no neck pain, no rash, no rhinorrhea and no vomiting     Past Medical History  Diagnosis Date  . Bell's palsy 01/2009    Left sided  . Type II diabetes mellitus   . Hypertension   . Hyperlipidemia   . Allergic rhinitis   . Intolerance, drug     Leg cramps on Lipitor  . GERD (gastroesophageal reflux disease)   . Uterine fibroid   . Chest pain     Exercise stress test negative 6/07  . Postmenopausal symptoms     Hot flashes, vaginal  dryness, iritability, difficulty sleeping. as of 2005.  Marland Kitchen Insomnia   . External hemorrhoid   . Lipoma 12/11    Posterior neck.   . Osteoarthritis     Carpometacarpal joint of right thumb  . Trigger finger     Right thumb  . Sebaceous cyst of breast     Has been refered to derm.  . Adenomatous colon polyp 6/09    Resected on colonoscopy, no high grade dysplasia.   . Hypertensive retinopathy     Followed by Dr. Elmer Picker.  . Diabetic peripheral neuropathy   . Postmenopausal bleeding 9/05    Endometrial biopsy showed  FOCAL TUBAL METAPLASIA     History reviewed. No pertinent past surgical history.  Family History  Problem Relation Age of Onset  . Pneumonia Mother   . Diabetes Mother   . Early death Father   . Diabetes Sister   . Diabetes Brother   . Diabetes Maternal Grandmother     History  Substance Use Topics  . Smoking status: Current Every Day Smoker -- 0.30 packs/day for 30 years    Types: Cigarettes    Last Attempt to Quit: 11/28/2010  . Smokeless tobacco: Former Neurosurgeon    Quit date: 11/28/2010     Comment: trying to quit .  Doing a little less now.  . Alcohol Use: No    OB History   Grav Para Term Preterm Abortions TAB SAB  Ect Mult Living                  Review of Systems  Constitutional: Negative for fever.  HENT: Positive for ear pain. Negative for congestion, rhinorrhea, neck pain, neck stiffness, dental problem and sinus pressure.   Eyes: Positive for pain. Negative for photophobia, discharge, redness, itching and visual disturbance.  Respiratory: Negative for shortness of breath.   Cardiovascular: Negative for chest pain.  Gastrointestinal: Negative for nausea and vomiting.  Musculoskeletal: Negative for gait problem.  Skin: Negative for rash.  Neurological: Positive for facial asymmetry. Negative for syncope, speech difficulty, weakness, light-headedness, numbness and headaches.  Psychiatric/Behavioral: Negative for confusion.    Allergies   Codeine and Hydrocodone-acetaminophen  Home Medications   Current Outpatient Rx  Name  Route  Sig  Dispense  Refill  . ACCU-CHEK FASTCLIX LANCETS MISC   Does not apply   1 each by Does not apply route 2 (two) times daily. Dx code- 250.00   102 each   5   . albuterol (PROVENTIL HFA;VENTOLIN HFA) 108 (90 BASE) MCG/ACT inhaler   Inhalation   Inhale 1-2 puffs into the lungs every 6 (six) hours as needed for wheezing.   3 Inhaler   1   . aspirin 81 MG EC tablet   Oral   Take 81 mg by mouth daily.           Marland Kitchen atenolol (TENORMIN) 25 MG tablet      TAKE ONE TABLET BY MOUTH ONE TIME DAILY   30 tablet   3   . Blood Glucose Monitoring Suppl (ACCU-CHEK NANO SMARTVIEW) W/DEVICE KIT   Does not apply   1 each by Does not apply route 2 (two) times daily. Dx code 250.00   1 kit   0   . cetirizine (ZYRTEC) 10 MG tablet   Oral   Take 1 tablet (10 mg total) by mouth daily.   30 tablet   3   . chlorthalidone (HYGROTON) 25 MG tablet   Oral   Take 1 tablet (25 mg total) by mouth daily.   30 tablet   3   . fluticasone (FLONASE) 50 MCG/ACT nasal spray   Nasal   Place 2 sprays into the nose daily.   16 g   2   . glipiZIDE (GLUCOTROL) 10 MG tablet   Oral   Take 1 tablet (10 mg total) by mouth daily.   30 tablet   3     This is a change from the Rx sent yesterday. She n ...   . glucose blood (ACCU-CHEK SMARTVIEW) test strip      Check blood sugar as instructed 2x a day. Dx code 250.00   100 each   6   . losartan (COZAAR) 100 MG tablet   Oral   Take 1 tablet (100 mg total) by mouth daily.   30 tablet   11   . metFORMIN (GLUCOPHAGE) 500 MG tablet      TAKE ONE TABLET BY MOUTH TWICE DAILY WITH FOOD   60 tablet   5   . pravastatin (PRAVACHOL) 20 MG tablet   Oral   Take 2 tablets (40 mg total) by mouth daily.   30 tablet   5   . predniSONE (DELTASONE) 10 MG tablet   Oral   Take 5 tablets (50 mg total) by mouth daily.   15 tablet   0     BP 176/88  Pulse  83  Temp(Src)  98.7 F (37.1 C) (Oral)  Resp 18  SpO2 100%  Physical Exam  Nursing note and vitals reviewed. Constitutional: She is oriented to person, place, and time. She appears well-developed and well-nourished.  HENT:  Head: Normocephalic and atraumatic.  Right Ear: Tympanic membrane, external ear and ear canal normal.  Left Ear: Tympanic membrane, external ear and ear canal normal.  Nose: Nose normal.  Mouth/Throat: Uvula is midline, oropharynx is clear and moist and mucous membranes are normal.  Eyes: Conjunctivae, EOM and lids are normal. Pupils are equal, round, and reactive to light. Right eye exhibits no nystagmus. Left eye exhibits no nystagmus.  Neck: Normal range of motion. Neck supple.  Cardiovascular: Normal rate and regular rhythm.   Pulmonary/Chest: Effort normal and breath sounds normal.  Abdominal: Soft. There is no tenderness.  Musculoskeletal: She exhibits no edema and no tenderness.       Cervical back: She exhibits normal range of motion, no tenderness and no bony tenderness.  Neurological: She is alert and oriented to person, place, and time. She has normal strength and normal reflexes. She displays normal reflexes. A cranial nerve deficit is present. No sensory deficit. She displays a negative Romberg sign. Coordination and gait normal. GCS eye subscore is 4. GCS verbal subscore is 5. GCS motor subscore is 6.  Right-sided facial droop that does not spare the forehead. Patient cannot raise right eyebrow.   Skin: Skin is warm and dry.  Psychiatric: She has a normal mood and affect.    ED Course  Procedures (including critical care time)  Labs Reviewed - No data to display No results found.   1. Bell's palsy     5:46 PM Patient seen and examined. Medications ordered. Review of lab work shows normal creatinine 3 months ago. Patient has diabetes.  Vital signs reviewed and are as follows: Filed Vitals:   11/21/12 1737  BP: 176/88  Pulse:   Temp:    Resp:   BP 176/88  Pulse 83  Temp(Src) 98.7 F (37.1 C) (Oral)  Resp 18  SpO2 100%  Two drops of proparacaine instilled into affected eye.   Fluorescein strip applied to affected eye. Wood's lamp used to assess for corneal abrasion. No corneal abrasion identified. No foreign bodies noted. No visible hyphema.   Patient tolerated procedure well without immediate complication.   Patient given Auralgan drops and instructed on their use.  Patient urged to followup with primary care physician if not improving in the next several days.   MDM  Facial droop is consistent with Bell's palsy. There is no Ramsey Hunt syndrome. Do not suspect stroke. Normal neurological exam other than facial findings. Forehead is not spared. Patient otherwise appears well. She has ear pain of uncertain etiology. Auralgan drops for ear pain.   Patient started on antiviral medications. First dose given in emergency department. Normal renal function on recent labs.  Prednisone not given 2/2 questionable efficacy and patient with history of diabetes.    Renne Crigler, [place] 11/21/12 2102

## 2012-11-21 NOTE — ED Notes (Signed)
Onset 2-3 days ago right eye pain clear drainage and right side facial droop states history of bells palsy and symptoms resolve intermittently. 10/10 achy ear pain.

## 2012-11-24 ENCOUNTER — Encounter: Payer: Medicaid Other | Admitting: Internal Medicine

## 2012-11-24 ENCOUNTER — Telehealth: Payer: Self-pay | Admitting: *Deleted

## 2012-11-24 NOTE — Telephone Encounter (Signed)
Pt calls w/ very slurred speech, states it is like this since the palsy started, she is having pain in her ear and not resting at all, would like something prescribed for pain, has upcoming appt and plans on keeping it.

## 2012-11-24 NOTE — Telephone Encounter (Signed)
Dr Meredith Pel notified me of this pt. She has right to refuse immediate [facility] appt.

## 2012-11-24 NOTE — Telephone Encounter (Signed)
Called pt per dr Meredith Pel and dr paya to ask pt to come to clinic this pm w/ dr Loistine Chance instead of tomorrow at [phone], she refuses, states she has an eye dr appt and will not miss it, "tell that dr he can wait til tomorrow morning"

## 2012-11-24 NOTE — ED Provider Notes (Signed)
Medical screening examination/treatment/procedure(s) were performed by non-physician practitioner and as supervising physician I was immediately available for consultation/collaboration.   Ambry Dix III, MD 11/24/12 1833 

## 2012-11-24 NOTE — Telephone Encounter (Signed)
Please add her in this afternoon to be seen. She needs to be re-evaluated. Thanks.

## 2012-11-25 ENCOUNTER — Observation Stay (HOSPITAL_COMMUNITY): Payer: Medicaid Other

## 2012-11-25 ENCOUNTER — Encounter: Payer: Self-pay | Admitting: Internal Medicine

## 2012-11-25 ENCOUNTER — Encounter (HOSPITAL_COMMUNITY): Payer: Self-pay | Admitting: Radiology

## 2012-11-25 ENCOUNTER — Observation Stay (HOSPITAL_COMMUNITY)
Admission: AD | Admit: 2012-11-25 | Discharge: 2012-11-26 | Disposition: A | Discharge status: Home / Self Care | Payer: Medicaid Other | Source: Ambulatory Visit | Attending: Internal Medicine | Admitting: Internal Medicine

## 2012-11-25 ENCOUNTER — Other Ambulatory Visit: Payer: Self-pay

## 2012-11-25 ENCOUNTER — Ambulatory Visit (INDEPENDENT_AMBULATORY_CARE_PROVIDER_SITE_OTHER): Payer: Medicaid Other | Admitting: Internal Medicine

## 2012-11-25 VITALS — BP 161/89 | HR 72 | Temp 97.0°F | Ht 65.0 in | Wt 178.5 lb

## 2012-11-25 DIAGNOSIS — D1779 Benign lipomatous neoplasm of other sites: Secondary | ICD-10-CM

## 2012-11-25 DIAGNOSIS — H9209 Otalgia, unspecified ear: Secondary | ICD-10-CM

## 2012-11-25 DIAGNOSIS — H9201 Otalgia, right ear: Secondary | ICD-10-CM

## 2012-11-25 DIAGNOSIS — I10 Essential (primary) hypertension: Secondary | ICD-10-CM | POA: Insufficient documentation

## 2012-11-25 DIAGNOSIS — R221 Localized swelling, mass and lump, neck: Secondary | ICD-10-CM | POA: Insufficient documentation

## 2012-11-25 DIAGNOSIS — E119 Type 2 diabetes mellitus without complications: Secondary | ICD-10-CM

## 2012-11-25 DIAGNOSIS — R29810 Facial weakness: Secondary | ICD-10-CM

## 2012-11-25 DIAGNOSIS — E1142 Type 2 diabetes mellitus with diabetic polyneuropathy: Secondary | ICD-10-CM | POA: Diagnosis present

## 2012-11-25 DIAGNOSIS — R220 Localized swelling, mass and lump, head: Secondary | ICD-10-CM | POA: Insufficient documentation

## 2012-11-25 DIAGNOSIS — B029 Zoster without complications: Principal | ICD-10-CM | POA: Insufficient documentation

## 2012-11-25 DIAGNOSIS — G510 Bell's palsy: Secondary | ICD-10-CM | POA: Insufficient documentation

## 2012-11-25 DIAGNOSIS — M542 Cervicalgia: Secondary | ICD-10-CM | POA: Diagnosis present

## 2012-11-25 DIAGNOSIS — J309 Allergic rhinitis, unspecified: Secondary | ICD-10-CM

## 2012-11-25 DIAGNOSIS — E118 Type 2 diabetes mellitus with unspecified complications: Secondary | ICD-10-CM | POA: Diagnosis present

## 2012-11-25 DIAGNOSIS — E1149 Type 2 diabetes mellitus with other diabetic neurological complication: Secondary | ICD-10-CM

## 2012-11-25 LAB — COMPREHENSIVE METABOLIC PANEL
ALT: 16 U/L (ref 0–35)
AST: 15 U/L (ref 0–37)
Albumin: 4.1 g/dL (ref 3.5–5.2)
Alkaline Phosphatase: 71 U/L (ref 39–117)
BUN: 11 mg/dL (ref 6–23)
CO2: 32 mEq/L (ref 19–32)
Calcium: 10.1 mg/dL (ref 8.4–10.5)
Chloride: 100 mEq/L (ref 96–112)
Creat: 0.62 mg/dL (ref 0.50–1.10)
Glucose, Bld: 212 mg/dL — ABNORMAL HIGH (ref 70–99)
Potassium: 4.1 mEq/L (ref 3.5–5.3)
Sodium: 139 mEq/L (ref 135–145)
Total Bilirubin: 0.2 mg/dL — ABNORMAL LOW (ref 0.3–1.2)
Total Protein: 7.8 g/dL (ref 6.0–8.3)

## 2012-11-25 LAB — SEDIMENTATION RATE: Sed Rate: 11 mm/hr (ref 0–22)

## 2012-11-25 LAB — CBC WITH DIFFERENTIAL/PLATELET
Basophils Absolute: 0 10*3/uL (ref 0.0–0.1)
Basophils Relative: 0 % (ref 0–1)
Eosinophils Absolute: 0.1 10*3/uL (ref 0.0–0.7)
Eosinophils Relative: 1 % (ref 0–5)
HCT: 40.6 % (ref 36.0–46.0)
Hemoglobin: 14 g/dL (ref 12.0–15.0)
Lymphocytes Relative: 36 % (ref 12–46)
Lymphs Abs: 2.7 10*3/uL (ref 0.7–4.0)
MCH: 30.1 pg (ref 26.0–34.0)
MCHC: 34.5 g/dL (ref 30.0–36.0)
MCV: 87.3 fL (ref 78.0–100.0)
Monocytes Absolute: 0.4 10*3/uL (ref 0.1–1.0)
Monocytes Relative: 6 % (ref 3–12)
Neutro Abs: 4.3 10*3/uL (ref 1.7–7.7)
Neutrophils Relative %: 57 % (ref 43–77)
Platelets: 302 10*3/uL (ref 150–400)
RBC: 4.65 MIL/uL (ref 3.87–5.11)
RDW: 12.7 % (ref 11.5–15.5)
WBC: 7.5 10*3/uL (ref 4.0–10.5)

## 2012-11-25 LAB — GLUCOSE, CAPILLARY
Glucose-Capillary: 225 mg/dL — ABNORMAL HIGH (ref 70–99)
Glucose-Capillary: 181 mg/dL — ABNORMAL HIGH (ref 70–99)

## 2012-11-25 MED ORDER — SODIUM CHLORIDE 0.9 % IV SOLN: INTRAVENOUS | Status: DC

## 2012-11-25 MED ORDER — SODIUM CHLORIDE 0.9 % IJ SOLN: 3.0000 mL | Freq: Two times a day (BID) | INTRAMUSCULAR | Status: DC

## 2012-11-25 MED ORDER — ARTIFICIAL TEARS OP OINT
TOPICAL_OINTMENT | Freq: Three times a day (TID) | OPHTHALMIC | Status: DC
  Administered 2012-11-25 – 2012-11-26 (×3): via OPHTHALMIC
  Filled 2012-11-25: qty 3.5

## 2012-11-25 MED ORDER — SODIUM CHLORIDE 0.9 % IJ SOLN: 3.0000 mL | INTRAMUSCULAR | Status: DC | PRN

## 2012-11-25 MED ORDER — DEXTROSE 5 % IV SOLN
800.0000 mg | Freq: Three times a day (TID) | INTRAVENOUS | Status: DC
  Administered 2012-11-25 – 2012-11-26 (×2): 800 mg via INTRAVENOUS
  Filled 2012-11-25 (×4): qty 16

## 2012-11-25 MED ORDER — ACYCLOVIR 200 MG PO CAPS
400.0000 mg | ORAL_CAPSULE | Freq: Every day | ORAL | Status: DC
  Filled 2012-11-25 (×3): qty 2

## 2012-11-25 MED ORDER — SODIUM CHLORIDE 0.9 % IV SOLN
INTRAVENOUS | Status: DC
  Administered 2012-11-25: 17:00:00 via INTRAVENOUS

## 2012-11-25 MED ORDER — ACYCLOVIR 400 MG PO TABS
400.0000 mg | ORAL_TABLET | Freq: Every day | ORAL | Status: DC
  Filled 2012-11-25 (×3): qty 1

## 2012-11-25 MED ORDER — HYDRALAZINE HCL 20 MG/ML IJ SOLN
10.0000 mg | Freq: Three times a day (TID) | INTRAMUSCULAR | Status: DC | PRN
  Administered 2012-11-25: 10 mg via INTRAVENOUS
  Filled 2012-11-25 (×2): qty 0.5

## 2012-11-25 MED ORDER — IOHEXOL 300 MG/ML  SOLN
80.0000 mL | Freq: Once | INTRAMUSCULAR | Status: AC | PRN
  Administered 2012-11-25: 80 mL via INTRAVENOUS

## 2012-11-25 MED ORDER — WHITE PETROLATUM GEL
Status: AC
  Administered 2012-11-25: 21:00:00
  Filled 2012-11-25: qty 5

## 2012-11-25 MED ORDER — SODIUM CHLORIDE 0.9 % IV SOLN: 250.0000 mL | INTRAVENOUS | Status: DC | PRN

## 2012-11-25 NOTE — Assessment & Plan Note (Addendum)
DD include bells palsy considering presentation with prodrome of ear pain initially followed by right-sided facial droop and history of Bell's palsy in 2007. 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 No vesicles are seen in ear canal neither on 2/11, 2/14 or today. No vesicles noted anywhere else either including eyes per Ophthalmology . Further differential diagnoses include infectious source considering must await tenderness and neck pain. Considering patient's atypical presentation and not able to take by mouth intake will admit patient for further evaluation and management. Patient needs a CT head and neck with contrast. Patient wants to go home and take care of her dogs and will be a direct admit. On-call resident was informed. I will obtain CBC and Cmet. CT scan needs prior authorization therefore I would defer to admitting team.

## 2012-11-25 NOTE — Progress Notes (Signed)
Subjective:   Patient ID: Bridget Owens female   DOB: 06-29-1956 57 y.o.   MRN: [id number]  HPI: [name] is a 57 y.o. female with past medical history significant as outlined below who presented to the clinic for a followup of her right ear pain. Since her last office visit patient was evaluated in the emergency room for worsening ear pain and right-sided facial droop.the patient was diagnosed with Bell's possibly and was given antiviral medication. Prednisone was not given per ED physician due to questionable efficacy inpatient history of diabetes.  Patient reports that after she was evaluated on February 11 for right ear pain she developed right-sided facial droop and worsening ear pain. She was evaluated in the emergency room as noted above. Patient is complaining today with worsening ear pain with neck pain, along with difficulty swallowing and swelling of her face.Her appetite significantly reduced. Her eyes are watery and somewhat painful.  Patient was seen by Ophthalmology yesterday ( Dr Gwen Pounds  With [facility] , ph [phone] ) who informed her that she has dry eyes and needs to use lubricant and taper her eye when she sleeps. I spoke with Dr Gwen Pounds and he noted that he did not see any vesicles in her eyes. 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 Reports she'll let's but has not checked her temperature.   Patient has not been taking her medication for few days due to difficulties swallowing.   Past Medical History  Diagnosis Date  . Bell's palsy 01/2009    Left sided  . Type II diabetes mellitus   . Hypertension   . Hyperlipidemia   . Allergic rhinitis   . Intolerance, drug     Leg cramps on Lipitor  . GERD (gastroesophageal reflux disease)   . Uterine fibroid   . Chest pain     Exercise stress test negative 6/07  .  Postmenopausal symptoms     Hot flashes, vaginal dryness, iritability, difficulty sleeping. as of 2005.  Marland Kitchen Insomnia   . External hemorrhoid   . Lipoma 12/11    Posterior neck.   . Osteoarthritis     Carpometacarpal joint of right thumb  . Trigger finger     Right thumb  . Sebaceous cyst of breast     Has been refered to derm.  . Adenomatous colon polyp 6/09    Resected on colonoscopy, no high grade dysplasia.   . Hypertensive retinopathy     Followed by Dr. Elmer Picker.  . Diabetic peripheral neuropathy   . Postmenopausal bleeding 9/05    Endometrial biopsy showed  FOCAL TUBAL METAPLASIA    Current Outpatient Prescriptions  Medication Sig Dispense Refill  . acyclovir (ZOVIRAX) 400 MG tablet Take 1 tablet (400 mg total) by mouth 5 (five) times daily.  50 tablet  0  . aspirin 81 MG EC tablet Take 81 mg by mouth daily.        Marland Kitchen atenolol (TENORMIN) 25 MG tablet Take 25 mg by mouth daily.      . cetirizine (ZYRTEC) 10 MG tablet Take 10 mg by mouth daily.      . chlorthalidone (HYGROTON) 25 MG tablet Take 25 mg by mouth daily.      . fluticasone (FLONASE) 50 MCG/ACT nasal spray Place 2 sprays into the nose daily.      Marland Kitchen glipiZIDE (  GLUCOTROL) 10 MG tablet Take 10 mg by mouth daily.      Marland Kitchen losartan (COZAAR) 100 MG tablet Take 100 mg by mouth daily.      . metFORMIN (GLUCOPHAGE) 500 MG tablet Take 500 mg by mouth 2 (two) times daily with a meal.      . pravastatin (PRAVACHOL) 20 MG tablet Take 40 mg by mouth daily.      . predniSONE (DELTASONE) 10 MG tablet Take 50 mg by mouth daily.      . [DISCONTINUED] pantoprazole (PROTONIX) 20 MG tablet Take 1 tablet (20 mg total) by mouth daily.  30 tablet  0   No current facility-administered medications for this visit.   Family History  Problem Relation Age of Onset  . Pneumonia Mother   . Diabetes Mother   . Early death Father   . Diabetes Sister   . Diabetes Brother   . Diabetes Maternal Grandmother    History   Social History  . Marital  Status: Single    Spouse Name: N/A    Number of Children: N/A  . Years of Education: N/A   Social History Main Topics  . Smoking status: Current Every Day Smoker -- 0.30 packs/day for 30 years    Types: Cigarettes    Last Attempt to Quit: 11/28/2010  . Smokeless tobacco: Former Neurosurgeon    Quit date: 11/28/2010     Comment: trying to quit .  Doing a little less now.  . Alcohol Use: No  . Drug Use: No  . Sexually Active: Not on file   Other Topics Concern  . Not on file   Social History Narrative  . No narrative on file   Review of Systems: Constitutional: Noted chills, fatigue,  decreased appetite HEENT: Denies photophobia but noted eye pain, redness Denies hearing loss, mouth sores, neck stiffness and tinnitus.  but noted ear pain, congestion, rhinorrhea,   trouble swallowing, neck pain Respiratory: Denies SOB, DOE, cough, chest tightness,  and wheezing.   Cardiovascular: Denies chest pain, palpitations and leg swelling.  Gastrointestinal: Denies nausea, vomiting, abdominal pain, diarrhea,  Skin: Denies pallor, rash and wound.  Neurological: Noted headaches.    Objective:  Physical Exam: Filed Vitals:   11/25/12 0841  BP: 161/89  Pulse: 72  Temp: 97 F (36.1 C)  TempSrc: Oral  Height: 5\' 5"  (1.651 m)  Weight: 178 lb 8 oz (80.967 kg)  SpO2: 100%   Constitutional: Vital signs reviewed.  Patient is a well-developed and well-nourished female in no acute distress and cooperative with exam. Alert and oriented x3.  Ear: TM normal bilaterally Mouth: no erythema or exudates, MMM Eyes: PERRL, EOMI, conjunctivae injected, watery No scleral icterus. Not able to see any vesicles  Neck: Tenderness of right mastoid process, tenderness to palpation on the right lateral side of the neck, mild swelling noted, no erythema . Lymph node palpable.    Cardiovascular: RRR, S1 normal, S2 normal, no MRG, pulses symmetric and intact bilaterally Pulmonary/Chest: CTAB, no wheezes, rales, or  rhonchi Abdominal: Soft. Non-tender, non-distended, bowel sounds Hematology: no cervical adenopathy.  Neurological: A&O x3,  Right sided facial droop with not able to close the right eye, smiling, frowning,flaring nostrils and raising eyebrows, able to maintain secretion, sensation intact on the tongue and face ,  Rest of CN within normal limits,   Strength is normal and symmetric bilaterally,  No focal motor deficit, sensory intact to light touch bilaterally.  Skin: Warm, dry and intact. No rash, cyanosis,  or clubbing.

## 2012-11-25 NOTE — Progress Notes (Signed)
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 Codeine Other (See Comments)    unknown  . Hydrocodone-Acetaminophen     REACTION: itching    Patient Measurements: Height: 5\' 5"  (165.1 cm) Weight: 178 lb 12.7 oz (81.1 kg) IBW/kg (Calculated) : 57 Adjusted Body Weight:   Vital Signs: Temp: 97.8 F (36.6 C) (02/18 1800) Temp src: Oral (02/18 1800) BP: 183/91 mmHg (02/18 1800) Pulse Rate: 65 (02/18 1800) Intake/Output from previous day:   Intake/Output from this shift:    Labs:  Recent Labs  11/25/12 0959  WBC 7.5  HGB 14.0  PLT 302  CREATININE 0.62   Estimated Creatinine Clearance: 80.6 ml/min (by C-G formula based on Cr of 0.62). No results found for this basename: VANCOTROUGH, VANCOPEAK, VANCORANDOM, GENTTROUGH, GENTPEAK, GENTRANDOM, TOBRATROUGH, TOBRAPEAK, TOBRARND, AMIKACINPEAK, AMIKACINTROU, AMIKACIN,  in the last 72 hours   Microbiology: No results found for this or any previous visit (from the past 720 hour(s)).  Medical History: Past Medical History  Diagnosis Date  . Bell's palsy 01/2009    Left sided  . Type II diabetes mellitus   . Hypertension   . Hyperlipidemia   . Allergic rhinitis   . Intolerance, drug     Leg cramps on Lipitor  . GERD (gastroesophageal reflux disease)   . Uterine fibroid   . Chest pain     Exercise stress test negative 6/07  . Postmenopausal symptoms     Hot flashes, vaginal dryness, iritability, difficulty sleeping. as of 2005.  Marland Kitchen Insomnia   . External hemorrhoid   . Lipoma 12/11    Posterior neck.   . Osteoarthritis     Carpometacarpal joint of right thumb  . Trigger finger     Right thumb  . Sebaceous cyst of breast     Has been refered to derm.  . Adenomatous colon polyp 6/09    Resected on colonoscopy, no high grade dysplasia.   . Hypertensive retinopathy     Followed by Dr. Elmer Picker.  . Diabetic peripheral neuropathy    . Postmenopausal bleeding 9/05    Endometrial biopsy showed  FOCAL TUBAL METAPLASIA   . Bell's palsy     Medications:  Scheduled:  . acyclovir  800 mg Intravenous Q8H  . artificial tears   Both Eyes Q8H  . sodium chloride  3 mL Intravenous Q12H  . sodium chloride  3 mL Intravenous Q12H  . white petrolatum      . [DISCONTINUED] acyclovir  400 mg Oral 5 X Daily  . [DISCONTINUED] acyclovir  400 mg Oral 5 X Daily   Assessment: 57 yr old female with chief complaint of pain on rt side of face, esp rt ear and eye.  Goal of Therapy:  Resolve infection  Plan:  Acyclovir 800 mg (10 mg/kg/dose) IV q8hrs.   Bridget Owens 11/25/2012,8:25 PM

## 2012-11-25 NOTE — H&P (Signed)
[facility] Note Date: 11/25/2012  Patient name: Bridget Owens Medical record number: [id number] Date of birth: May 29, 1956 Age: 57 y.o. Gender: female PCP: Bronson Curb, MD  Medical Service: Internal Medicine Teaching Service B1  Attending physician: Dr. Luciana Axe      Chief Complaint: R ear and eye pain, R-sided pain  History of Present Illness: Bridget Owens is a 57 yo woman who presents with worsening right ear and eye pain and right-sided facial droop. The pain started 2/7. Patient was seen in clinic on 2/11 because of increasing pain and diagnosed with viral URI. Patient returned to ED on 2/14 for worsening symptoms, where she was diagnosed her with herpes zoster and started on acyclovir. On 2/17, patient saw ophthalmology who diagnosed her with dry eyes. Patient returned to clinic once again today for unresolved pain in her ear and eye, unresolved facial weakness, and new pain in her neck with difficulty swallowing. 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 Patient denies fever, cough, or recent sick contacts. Patient recalls a similar episode of facial weakness in 2010. Although the chart says this was Bell Palsy on the left, patient believes it was on the right. Patient denies recent travel or tick bites.  Meds: No current outpatient prescriptions on file.  Allergies: Allergies as of 11/25/2012 - Review Complete 11/25/2012  Allergen Reaction Noted  . Codeine Other (See Comments)   . Hydrocodone-acetaminophen     Past Medical History  Diagnosis Date  . Bell's palsy 01/2009    Left sided  . Type II diabetes mellitus   . Hypertension   . Hyperlipidemia   . Allergic rhinitis   . Intolerance, drug     Leg cramps on Lipitor  . GERD (gastroesophageal reflux disease)   . Uterine fibroid   . Chest pain     Exercise stress test negative 6/07  . Postmenopausal symptoms      Hot flashes, vaginal dryness, iritability, difficulty sleeping. as of 2005.  Marland Kitchen Insomnia   . External hemorrhoid   . Lipoma 12/11    Posterior neck.   . Osteoarthritis     Carpometacarpal joint of right thumb  . Trigger finger     Right thumb  . Sebaceous cyst of breast     Has been refered to derm.  . Adenomatous colon polyp 6/09    Resected on colonoscopy, no high grade dysplasia.   . Hypertensive retinopathy     Followed by Dr. Elmer Picker.  . Diabetic peripheral neuropathy   . Postmenopausal bleeding 9/05    Endometrial biopsy showed  FOCAL TUBAL METAPLASIA    No past surgical history on file. Family History  Problem Relation Age of Onset  . Pneumonia Mother   . Diabetes Mother   . Early death Father   . Diabetes Sister   . Diabetes Brother   . Diabetes Maternal Grandmother    History   Social History  . Marital Status: Single    Spouse Name: N/A    Number of Children: N/A  . Years of Education: N/A   Occupational History  . Not on file.   Social History Main Topics  . Smoking status: Current Every Day Smoker -- 0.30 packs/day for 30 years    Types: Cigarettes    Last Attempt to Quit: 11/28/2010  . Smokeless tobacco: Former Neurosurgeon    Quit date: 11/28/2010     Comment:  trying to quit .  Doing a little less now.  . Alcohol Use: No  . Drug Use: No  . Sexually Active: Not on file   Other Topics Concern  . Not on file   Social History Narrative  . No narrative on file    Review of Systems: ROS negative except as noted in HPI.  Physical Exam: Blood pressure 135/78, pulse 80, temperature 97.6 F (36.4 C), temperature source Oral, resp. rate 18, SpO2 98.00%. General: lying in bed comfortably, alert, cooperative, appears stated age Head: normocephalic, atraumatic Eyes: right eye with mild clear discharge, no vesicles noted Ears: TMs normal bilaterally, no vesicles seen in EAC Nose: no vesicles seen in nares Mouth: pink, moist mucosa, no vesicles seen Neck:  no carotid bruits, tender to palpation in right submandibular region CV: RRR, no m/r/g Pulm: CTAB Abd: non-tender, non-distended, normoactive bowel sounds Neuro: CNs II-VI and VIII-XII intact; weakness in the distribution of CN VII noted, without forehead sparing. Upper and lower extremity strength 5/5 bilaterally.  Lab results: CMP wnl CBC wnl  Imaging results:  No results found.  Assessment & Plan by Problem: Bridget Owens is a 57 yo woman who presents with right facial weakness, right ear/eye/neck pain.  1. Bell Palsy: Bridget Owens has a right-sided peripheral CN VII deficit. The potential etiologies are numerous. Inflammatory causes include temporal arteritis. Guillain-Barre can also rarely present with Bell Palsy. Patient denies headache or blurring of vision, however. Vascular causes include CVA, although this is less likely given the focal nature of patient's symptoms. Infectious causes include herpes zoster, HIV, borrielia burgdorferi, and treponema pallidum. 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 Autoimmune diseases like lupus and sarcoidosis could also present with Bell Palsy, and patient's gender and ethnicity put her at higher risk for these etiologies. Bell Palsy could also be caused by the patient's diabetes. Other metabolic causes could include hypothyroidism. Patient denies drug use, but drug abuse can cause Bell Palsy. -admit to tele bed -neuro checks q4 -will order CT head to r/o CVA. Given that patient is now almost 2 weeks from symptom onset, CT should be adequate to evaluate for CVA. -will order ESR to help r/o temporal arteritis and other vasculopathies. -HIV panel, RPR, b. Burgdorferi antibodies -A1c -TSH and free T4 -ANA -UDS -continue acyclovir for now -eyedrops prn for comfort  2. Neck pain: Patient has been afebrile and does not have  leukocytosis, but has neck tenderness on exam and odynophagia, which is concerning for abscess. -CT maxillofacial and neck with contrast  3. T2DM: Glucose 200 on admission. -defer SSI for now since patient is NPO -continue to monitor  4. Hypertension: Patient has hypertension -will allow some permissive hypertension for now in case of CVA -hydralazine prn for BP>170/110  VTE prophylaxis: SCDs for now due to possibility of CVA.  Full Code Status  Disposition: Deferred for now until patient's condition is more stable. Anticipated discharge in 2-3 days.    This is a Psychologist, occupational Note.  The care of the patient was discussed with Dr. Dierdre Searles and the assessment and plan was formulated with their assistance.  Please see their note for official documentation of the patient encounter.   Signed: Roe Coombs 11/25/2012, 4:38 PM   Resident Co-sign Daily Note: I have seen the patient and reviewed the daily progress note by MS 4 Roe Coombs and discussed the care of  the patient with them.  See below for documentation of my findings, assessment, and plans.  Objective: Vital signs in last 24 hours: Filed Vitals:   11/25/12 1400 11/25/12 1440 11/25/12 1639 11/25/12 1800  BP: 135/78   183/91  Pulse: 80   65  Temp: 97.6 F (36.4 C)   97.8 F (36.6 C)  TempSrc: Oral   Oral  Resp: 18   18  Height:  5\' 5"  (1.651 m) 5\' 5"  (1.651 m)   Weight:  178 lb 8 oz (80.967 kg) 178 lb 12.7 oz (81.1 kg)   SpO2: 98%   100%   Physical Exam:  General: lying in bed comfortably, alert, cooperative, appears stated age  Head: normocephalic, atraumatic  Eyes: right eye with mild clear discharge, no vesicles noted  Ears: TMs normal bilaterally, no vesicles seen in EAC  Nose: no vesicles seen in nares  Mouth: pink, moist mucosa, no vesicles seen  Neck: no carotid bruits, tender to palpation in right submandibular region  CV: RRR, no m/r/g  Pulm: CTAB  Abd: non-tender, non-distended, normoactive bowel sounds   Neuro: CNs II-VI and VIII-XII intact; weakness in the distribution of CN VII noted, without forehead sparing. Upper and lower extremity strength 5/5 bilaterally.  Lab Results: Reviewed and documented in Electronic Record Micro Results: Reviewed and documented in Electronic Record Studies/Results: Reviewed and documented in Electronic Record Medications: I have reviewed the patient's current medications. Scheduled Meds: . acyclovir  800 mg Intravenous Q8H  . artificial tears   Both Eyes Q8H  . sodium chloride  3 mL Intravenous Q12H  . sodium chloride  3 mL Intravenous Q12H   Continuous Infusions: . sodium chloride 75 mL/hr at 11/25/12 1700   PRN Meds:.sodium chloride, hydrALAZINE, sodium chloride Assessment/Plan:  This patient has been seen and discussed with MS 4 Roe Coombs. Please see his note for complete details. I concur with his findings with the following additions/corrections   Patient presented with right-sided facial droop which is consistent with peripheral CN VII deficit.  We will rule out the etiologies including inflammatory, infectious, endocrinology, vascular, neoplastic, and autoimmune. We will start the IV antiviral treatment acyclovir now.   She failed swallowing screen and will be kept on NPO with IVF.     LOS: 0 days   Hadley Soileau 11/25/2012, 6:48 PM

## 2012-11-26 ENCOUNTER — Other Ambulatory Visit: Payer: Self-pay | Admitting: Internal Medicine

## 2012-11-26 LAB — BASIC METABOLIC PANEL
BUN: 9 mg/dL (ref 6–23)
CO2: 26 mEq/L (ref 19–32)
Calcium: 9.6 mg/dL (ref 8.4–10.5)
Chloride: 107 mEq/L (ref 96–112)
Creatinine, Ser: 0.55 mg/dL (ref 0.50–1.10)
GFR calc Af Amer: >90 mL/min (ref >90)
GFR calc non Af Amer: >90 mL/min (ref >90)
Glucose, Bld: 167 mg/dL — ABNORMAL HIGH (ref 70–99)
Potassium: 3.9 mEq/L (ref 3.5–5.1)
Sodium: 142 mEq/L (ref 135–145)

## 2012-11-26 LAB — GLUCOSE, CAPILLARY: Glucose-Capillary: 159 mg/dL — ABNORMAL HIGH (ref 70–99)

## 2012-11-26 LAB — B. BURGDORFI ANTIBODIES: B burgdorferi Ab IgG+IgM: 0.33 {ISR}

## 2012-11-26 LAB — RAPID URINE DRUG SCREEN, HOSP PERFORMED
Amphetamines: NOT DETECTED
Barbiturates: NOT DETECTED
Benzodiazepines: NOT DETECTED
Cocaine: NOT DETECTED
Opiates: POSITIVE — AB
Tetrahydrocannabinol: NOT DETECTED

## 2012-11-26 LAB — HIV ANTIBODY (ROUTINE TESTING W REFLEX): HIV: NONREACTIVE

## 2012-11-26 LAB — CBC
HCT: 37.9 % (ref 36.0–46.0)
Hemoglobin: 13.3 g/dL (ref 12.0–15.0)
MCH: 30.4 pg (ref 26.0–34.0)
MCHC: 35.1 g/dL (ref 30.0–36.0)
MCV: 86.5 fL (ref 78.0–100.0)
Platelets: 288 10*3/uL (ref 150–400)
RBC: 4.38 MIL/uL (ref 3.87–5.11)
RDW: 12.5 % (ref 11.5–15.5)
WBC: 6.8 10*3/uL (ref 4.0–10.5)

## 2012-11-26 LAB — RPR: RPR Ser Ql: NONREACTIVE

## 2012-11-26 LAB — TSH: TSH: 1.83 u[IU]/mL (ref 0.350–4.500)

## 2012-11-26 LAB — T4, FREE: Free T4: 1.01 ng/dL (ref 0.80–1.80)

## 2012-11-26 LAB — ANA: Anti Nuclear Antibody(ANA): NEGATIVE

## 2012-11-26 LAB — HEMOGLOBIN A1C
Hgb A1c MFr Bld: 9 % — ABNORMAL HIGH (ref <5.7)
Mean Plasma Glucose: 212 mg/dL — ABNORMAL HIGH (ref <117)

## 2012-11-26 MED ORDER — PREDNISONE 20 MG PO TABS: ORAL_TABLET | ORAL | Status: DC

## 2012-11-26 MED ORDER — INSULIN ASPART 100 UNIT/ML ~~LOC~~ SOLN: 0.0000 [IU] | Freq: Three times a day (TID) | SUBCUTANEOUS | Status: DC

## 2012-11-26 MED ORDER — MORPHINE SULFATE 2 MG/ML IJ SOLN
2.0000 mg | INTRAMUSCULAR | Status: DC | PRN
  Administered 2012-11-26: 2 mg via INTRAVENOUS
  Filled 2012-11-26: qty 1

## 2012-11-26 NOTE — Progress Notes (Signed)
Medical Student Daily Progress Note  Subjective: No acute events overnight. Patient is sitting comfortably in bed. She reports her ear, eye, and neck pain have improved. Since yesterday. Weakness is also somewhat improved.  Objective: Vital signs in last 24 hours: Filed Vitals:   11/26/12 0230 11/26/12 0500 11/26/12 0601 11/26/12 0900  BP: 166/82  154/100 139/81  Pulse: 74  79 96  Temp: 97.3 F (36.3 C)  97.5 F (36.4 C) 97.6 F (36.4 C)  TempSrc: Oral  Oral Oral  Resp: 20  18 18   Height:      Weight:  81.2 kg (179 lb 0.2 oz)    SpO2: 94%  99% 100%   Weight change:   Intake/Output Summary (Last 24 hours) at 11/26/12 0945 Last data filed at 11/26/12 0830  Gross per 24 hour  Intake      0 ml  Output      0 ml  Net      0 ml   Physical Exam: General: alert, sitting in bed comfortably Head: normocephalic, atraumatic  Eyes: right eye with mild clear discharge, seems to be improved from yesterday, no vesicles noted  Neck: no carotid bruits, tender to palpation in right submandibular region but improved from yesterday CV: RRR, no m/r/g  Pulm: CTAB  Abd: non-tender, non-distended, normoactive bowel sounds  Neuro: CNs II-VI and VIII-XII intact; weakness in the distribution of CN VII noted, without forehead sparing. Upper and lower extremity strength 5/5 bilaterally.  Lab Results: BMP wnl CBC wnl UDS positive for opiates TSH/Free T4 wnl A1c 9.0 HIV antibody non-reactive RPR non-reactive B. Burgdorferi antibody pending ESR wnl ANA pending  Micro Results: No results found for this or any previous visit (from the past 240 hour(s)). Studies/Results: X-ray Chest Pa And Lateral   11/25/2012  *RADIOLOGY REPORT*  Clinical Data: Dysphagia, hypertension, diabetes, possible stroke  CHEST - 2 VIEW  Comparison: 01/02/2012  Findings: Normal heart size and vascularity.  No focal pneumonia, collapse, consolidation, edema, effusion or pneumothorax.  Trachea is midline.  Monitor leads  overlie the chest.  Stable exam.  IMPRESSION: No acute chest finding by plain radiography   Original Report Authenticated By: Judie Petit. Miles Costain, M.D.    Ct Head Wo Contrast  11/25/2012  *RADIOLOGY REPORT*  Clinical Data: 57 year old female with right facial droop, right ear and neck pain.  CT HEAD WITHOUT CONTRAST CT NECK WITH CONTRAST  Technique:  Contiguous axial images were obtained from the base of the skull through the vertex without contrast. Multidetector CT imaging of the neck was performed using the standard protocol without intravenous contrast.  Comparison:  Head CT without contrast 05/10/2004.  Cervical spine radiographs 07/28/2010.  CT HEAD  Findings: Visualized paranasal sinuses and mastoids are clear. Visualized orbit soft tissues are within normal limits.  Visualized scalp soft tissues are within normal limits.  Calvarium intact.  There is a small low density focus in the left posterior hemisphere at or near the tentorium with densitometry of fat (-22 Hounsfield units) measuring 6-7 mm in diameter today, 2 mm diameter 9 years earlier.  No significant mass effect.  No surrounding edema.  No other intracranial mass lesion.  No ventriculomegaly.  Normal cerebral volume. Gray-white matter differentiation is within normal limits throughout the brain.  No evidence of cortically based acute infarction identified.  No acute intracranial hemorrhage identified.  No suspicious intracranial vascular hyperdensity.  IMPRESSION: 1.  Left posterior hemisphere low density lesion with modest growth since 2005 seems most compatible with intracranial  dermoid/epidermoid. No associated mass effect or edema; favor this is incidental and clinically silent. Nonemergent MRI without and with contrast would confirm. 2.  Otherwise stable and negative noncontrast CT appearance of the brain. 3.  Neck findings are below. 4.  Right middle ear and mastoids are clear.  CT NECK  Contrast:  80 ml Omnipaque-300.  Findings: Atelectasis and  paraseptal emphysema in the visible lungs.  No superior mediastinal lymphadenopathy.  Mild thyromegaly but no discrete thyroid lesion.  Larynx, pharynx, parapharyngeal spaces, retropharyngeal space and sublingual space are within normal limits.  Parotid and submandibular glands are within normal limits.  No cervical lymphadenopathy; right level IIB nodes are asymmetrically increased in number but remain within normal limits by size criteria.  Small lymph nodes elsewhere appears symmetric.  Visualized major vascular structures are patent.  No acute osseous abnormality identified.  Degenerative changes in the cervical spine.  Ankylosis of the right C4-C5 posterior elements.  Facet and disc degeneration elsewhere.  On these images the left posterior hemisphere lesion does appear to abut the tentorium.  No enhancement.  IMPRESSION: No soft tissue abnormality identified in the neck. Cervical spine degeneration.  Paraseptal emphysema.   Original Report Authenticated By: Erskine Speed, M.D.    Ct Soft Tissue Neck W Contrast  11/25/2012  *RADIOLOGY REPORT*  Clinical Data: 57 year old female with right facial droop, right ear and neck pain.  CT HEAD WITHOUT CONTRAST CT NECK WITH CONTRAST  Technique:  Contiguous axial images were obtained from the base of the skull through the vertex without contrast. Multidetector CT imaging of the neck was performed using the standard protocol without intravenous contrast.  Comparison:  Head CT without contrast 05/10/2004.  Cervical spine radiographs 07/28/2010.  CT HEAD  Findings: Visualized paranasal sinuses and mastoids are clear. Visualized orbit soft tissues are within normal limits.  Visualized scalp soft tissues are within normal limits.  Calvarium intact.  There is a small low density focus in the left posterior hemisphere at or near the tentorium with densitometry of fat (-22 Hounsfield units) measuring 6-7 mm in diameter today, 2 mm diameter 9 years earlier.  No significant mass  effect.  No surrounding edema.  No other intracranial mass lesion.  No ventriculomegaly.  Normal cerebral volume. Gray-white matter differentiation is within normal limits throughout the brain.  No evidence of cortically based acute infarction identified.  No acute intracranial hemorrhage identified.  No suspicious intracranial vascular hyperdensity.  IMPRESSION: 1.  Left posterior hemisphere low density lesion with modest growth since 2005 seems most compatible with intracranial dermoid/epidermoid. No associated mass effect or edema; favor this is incidental and clinically silent. Nonemergent MRI without and with contrast would confirm. 2.  Otherwise stable and negative noncontrast CT appearance of the brain. 3.  Neck findings are below. 4.  Right middle ear and mastoids are clear.  CT NECK  Contrast:  80 ml Omnipaque-300.  Findings: Atelectasis and paraseptal emphysema in the visible lungs.  No superior mediastinal lymphadenopathy.  Mild thyromegaly but no discrete thyroid lesion.  Larynx, pharynx, parapharyngeal spaces, retropharyngeal space and sublingual space are within normal limits.  Parotid and submandibular glands are within normal limits.  No cervical lymphadenopathy; right level IIB nodes are asymmetrically increased in number but remain within normal limits by size criteria.  Small lymph nodes elsewhere appears symmetric.  Visualized major vascular structures are patent.  No acute osseous abnormality identified.  Degenerative changes in the cervical spine.  Ankylosis of the right C4-C5 posterior elements.  Facet  and disc degeneration elsewhere.  On these images the left posterior hemisphere lesion does appear to abut the tentorium.  No enhancement.  IMPRESSION: No soft tissue abnormality identified in the neck. Cervical spine degeneration.  Paraseptal emphysema.   Original Report Authenticated By: Erskine Speed, M.D.    Medications: I have reviewed the patient's current medications. Scheduled Meds: .  acyclovir  800 mg Intravenous Q8H  . artificial tears   Both Eyes Q8H  . sodium chloride  3 mL Intravenous Q12H  . sodium chloride  3 mL Intravenous Q12H   Continuous Infusions: . sodium chloride 75 mL/hr at 11/25/12 1700   PRN Meds:.sodium chloride, hydrALAZINE, morphine injection, sodium chloride Assessment/Plan: Bridget Owens is a 57 yo woman who presents with right facial weakness, right ear/eye/neck pain.   1. Bell Palsy: Bridget Owens has a right-sided peripheral CN VII deficit. The potential etiologies are numerous. Inflammatory causes include temporal arteritis. Guillain-Barre can also rarely present with Bell Palsy. Patient denies headache or blurring of vision, however. Vascular causes include CVA, although this is less likely given the focal nature of patient's symptoms. Infectious causes include herpes zoster, HIV, borrielia burgdorferi, and treponema pallidum. 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 Autoimmune diseases like lupus and sarcoidosis could also present with Bell Palsy, and patient's gender and ethnicity put her at higher risk for these etiologies. Bell Palsy could also be caused by the patient's diabetes. Other metabolic causes could include hypothyroidism. Patient denies drug use, but drug abuse can cause Bell Palsy.  Based on the returned lab results, the most likely etiology at this point is herpes zoster or some other viral infection leading to Bells palsy. Lyme disease is unlikely given the low prevalence in this area. Autoimmune causes cannot be ruled out at this point. -neuro checks q4  -CT head negative for CVA or bleeding -CT neck normal, no signs of infection or edema -ESR normal -HIV panel, RPR non-reactive -A1c 9.0 -TSH and free T4 normal -ANA, B. burgdorferi antibodies  pending -UDS positive for opiates -continue acyclovir for now   -eyedrops prn for comfort   2. Neck pain: Patient has been afebrile and does not have leukocytosis, but has neck tenderness on exam and odynophagia. Pain with swallowing is somewhat improved. Patient failed bedside swallow yesterday but will get formal eval from ST today. Swallowing may be  -CT neck negative for infection or edema - ST swallow study pending  3. T2DM: Glucose 200 on admission.  -defer SSI for now since patient is NPO  -continue to monitor   4. Hypertension: Patient has hypertension  -will allow some permissive hypertension for now in case of CVA  -hydralazine prn for BP>170/110   5. Intracranial mass: CT head yesterday noted a non-enhancing, low density lesion int he left posterior hemisphere approximately 6mm in size. This same mass was appreciated in 2005 but was only 2mm then. There is no evidence of mass effect. -patient will need outpatient follow-up with MRI with and without contrast  VTE prophylaxis: SCDs  Full Code Status   Disposition: Patient is stable and improving. Discharge today pending normal swallow study.   LOS: 1 day   This is a Psychologist, occupational Note.  The care of the patient was discussed with Dr. Luciana Axe and the assessment and plan formulated with their assistance.  Please see their attached note for official documentation of the daily  encounter.  Roe Coombs 11/26/2012, 9:45 AM

## 2012-11-26 NOTE — Evaluation (Addendum)
Clinical/Bedside Swallow Evaluation Patient Details  Name: JAQUAYA COYLE MRN: [id number] Date of Birth: 10/24/1955  Today's Date: 11/26/2012 Time: 4098-1191 SLP Time Calculation (min): 29 min  Past Medical History:  Past Medical History  Diagnosis Date  . Bell's palsy 01/2009    Left sided  . Type II diabetes mellitus   . Hypertension   . Hyperlipidemia   . Allergic rhinitis   . Intolerance, drug     Leg cramps on Lipitor  . GERD (gastroesophageal reflux disease)   . Uterine fibroid   . Chest pain     Exercise stress test negative 6/07  . Postmenopausal symptoms     Hot flashes, vaginal dryness, iritability, difficulty sleeping. as of 2005.  Marland Kitchen Insomnia   . External hemorrhoid   . Lipoma 12/11    Posterior neck.   . Osteoarthritis     Carpometacarpal joint of right thumb  . Trigger finger     Right thumb  . Sebaceous cyst of breast     Has been refered to derm.  . Adenomatous colon polyp 6/09    Resected on colonoscopy, no high grade dysplasia.   . Hypertensive retinopathy     Followed by Dr. Elmer Picker.  . Diabetic peripheral neuropathy   . Postmenopausal bleeding 9/05    Endometrial biopsy showed  FOCAL TUBAL METAPLASIA   . Bell's palsy    Past Surgical History:  Past Surgical History  Procedure Laterality Date  . Tubal ligation     HPI:  Ms. Mcgann is a 57 yo woman who presents with worsening right ear and eye pain and right-sided facial droop. The pain started 2/7. Patient was seen in clinic on 2/11 because of increasing pain and diagnosed with viral URI. Patient returned to ED on 2/14 for worsening symptoms, where she was diagnosed her with herpes zoster and started on acyclovir. On 2/17, patient saw ophthalmology who diagnosed her with dry eyes. Patient returned to clinic once again today for unresolved pain in her ear and eye, unresolved facial weakness, and new pain in her neck with difficulty swallowing. 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 Patient denies fever, cough, or recent sick contacts. Patient recalls a similar episode of facial weakness in 2010. Although the chart says this was Bell Palsy on the left, patient believes it was on the right. Patient denies recent travel or tick bites. Patient referred for BSE per stroke protocol as she failed RN Stroke Swallow Screen.     Assessment / Plan / Recommendation Clinical Impression  No pain during swallow of PO trials indicated.  Anterior spillage on right  during oral prep stage with cracker but mastication functional.  No s/s of aspiration noted during evaluation proceed with regular consistency as tolerated with thin liquids.  Patient may benefit from ENT consult if odynophagia returns.  ST to sign off as education complete.      Aspiration Risk  None    Diet Recommendation Regular   Liquid Administration via: Cup;Straw Medication Administration: Whole meds with liquid Supervision: Patient able to self feed Compensations: Slow rate;Small sips/bites Postural Changes and/or Swallow Maneuvers: Out of bed for meals;Seated upright 90 degrees;Upright 30-60 min after meal    Other  Recommendations Oral Care Recommendations: Oral care BID Other Recommendations: Clarify dietary restrictions   Follow Up Recommendations  None  Swallow Study Prior Functional Status   No prior history of dysphagia per patient     General Date of Onset: 11/25/12 HPI: Ms. Cunanan is a 57 yo woman who presents with worsening right ear and eye pain and right-sided facial droop. The pain started 2/7. Patient was seen in clinic on 2/11 because of increasing pain and diagnosed with viral URI. Patient returned to ED on 2/14 for worsening symptoms, where she was diagnosed her with herpes zoster and started on acyclovir. On 2/17, patient saw ophthalmology who diagnosed her with dry eyes. Patient returned to clinic  once again today for unresolved pain in her ear and eye, unresolved facial weakness, and new pain in her neck with difficulty swallowing. 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 Patient denies fever, cough, or recent sick contacts. Patient recalls a similar episode of facial weakness in 2010. Although the chart says this was Bell Palsy on the left, patient believes it was on the right. Patient denies recent travel or tick bites. Type of Study: Bedside swallow evaluation Diet Prior to this Study: NPO Respiratory Status: Room air Behavior/Cognition: Alert;Cooperative;Pleasant mood Oral Cavity - Dentition: Dentures, top;Dentures, bottom Self-Feeding Abilities: Able to feed self Baseline Vocal Quality: Clear Volitional Cough: Strong Volitional Swallow: Able to elicit    Oral/Motor/Sensory Function Overall Oral Motor/Sensory Function: Impaired Labial ROM: Reduced right Labial Symmetry: Abnormal symmetry right Labial Strength: Reduced Labial Sensation: Reduced Lingual ROM: Within Functional Limits Lingual Symmetry: Abnormal symmetry left Lingual Strength: Within Functional Limits Lingual Sensation: Within Functional Limits Facial ROM: Reduced right Facial Symmetry: Right droop Facial Strength: Reduced Facial Sensation: Reduced Velum: Within Functional Limits Mandible: Within Functional Limits   Ice Chips Ice chips: Not tested   Thin Liquid Thin Liquid: Within functional limits Presentation: Cup;Straw;Spoon    Nectar Thick Nectar Thick Liquid: Not tested   Honey Thick Honey Thick Liquid: Within functional limits   Puree Puree: Within functional limits Presentation: Self Fed   Solid   GO Functional Assessment Tool Used: Clinical judgement  Functional Limitations: Swallowing Swallow Current Status ([id number]): At least 1 percent but less than 20 percent impaired, limited or  restricted Swallow Goal Status [phone]): At least 1 percent but less than 20 percent impaired, limited or restricted Swallow Discharge Status [phone]): At least 1 percent but less than 20 percent impaired, limited or restricted  Solid: Impaired Oral Phase Impairments: Reduced labial seal Oral Phase Functional Implications: Right anterior spillage      Moreen Fowler MS, CCC-SLP [phone] [facility] 11/26/2012,10:30 AM

## 2012-11-26 NOTE — H&P (Signed)
Patient seen and examined and history reviewed.  More than 1 week of facial droop on right side and history of the same in 2010.  Concern for zoster and seen by ophthalmology who did not find any lesions.  Was persistent and start on acyclovir but no improvement.  Sent in for hospital management with persistent symptoms.  No lesions. CT done and does not show any fluid collection.  Bedside swallow eval concerning though patient herself does not feel she has any dysphagia, only difficulty with her oral function.   Exam does show right facial droop, numbness.   Likely viral/idiopathic.  Lyme disease is not endemic here and patient has not traveled to Lyme disease area so low pretest probability.   She will get a swallow eval, continue steroids for 7-10 days and can take acyclovir 400 mg 5 times a day or valacyclovir 1000mg  TID for 7 days and follow up with her PCP this week.

## 2012-11-26 NOTE — Consult Note (Cosign Needed)
Yesenia Locurto M. Treysean Petruzzi, EdD 

## 2012-11-26 NOTE — Progress Notes (Signed)
S: no pain, no problem swallowing O: 97.6, 139/81, P96, R18 Gen: AAO x 3, nad HEENT: facial droop  A/p - Bell's Palsey -differential is broad, labs all reassuring.  Some evidence that treatment the antivirals (acyclovir/valacyclovir) provide benefit, more evidence with steroids.   OK for d/c today.

## 2012-11-26 NOTE — Progress Notes (Signed)
Discharge Note: Pt is alert and oriented, VS are stable, denies CP.  Telemetry & IV discontinued.  Discharge instructions reviewed with patient, pt verbalizes understanding.  Wheelchair transportation provided, all belongings with patient  

## 2012-11-27 DIAGNOSIS — G510 Bell's palsy: Secondary | ICD-10-CM | POA: Diagnosis present

## 2012-11-27 DIAGNOSIS — D1779 Benign lipomatous neoplasm of other sites: Secondary | ICD-10-CM

## 2012-11-27 DIAGNOSIS — M542 Cervicalgia: Secondary | ICD-10-CM | POA: Diagnosis present

## 2012-11-27 DIAGNOSIS — B029 Zoster without complications: Secondary | ICD-10-CM | POA: Diagnosis present

## 2012-11-27 LAB — GLUCOSE, CAPILLARY: Glucose-Capillary: 233 mg/dL — ABNORMAL HIGH (ref 70–99)

## 2012-11-27 NOTE — Discharge Summary (Signed)
Internal Medicine Teaching [facility] Discharge Note  Name: Bridget Owens MRN: [id number] DOB: [birth year] 57 y.o.  Date of Admission: 11/25/2012  1:56 PM Date of Discharge: 11/26/2012 Attending Physician: Dr. Staci Righter Discharge Diagnosis: 1. Herpes Zoster infection 2. Bell's Palsy 3. Neck pain 4. DM type 2 5. Hypertension 6. Intracranial mass   Discharge Medications:   Medication List    TAKE these medications       acyclovir 400 MG tablet  Commonly known as:  ZOVIRAX  Take 400 mg by mouth 5 (five) times daily.     aspirin 81 MG EC tablet  Take 81 mg by mouth daily.     chlorthalidone 25 MG tablet  Commonly known as:  HYGROTON  Take 25 mg by mouth daily.     diphenhydrAMINE 25 MG tablet  Commonly known as:  BENADRYL  Take 25 mg by mouth every 6 (six) hours as needed for allergies.     ibuprofen 200 MG tablet  Commonly known as:  ADVIL,MOTRIN  Take 400 mg by mouth every 6 (six) hours as needed for pain.     losartan 100 MG tablet  Commonly known as:  COZAAR  Take 100 mg by mouth daily.     metFORMIN 500 MG tablet  Commonly known as:  GLUCOPHAGE  Take 500 mg by mouth 2 (two) times daily with a meal.     pravastatin 20 MG tablet  Commonly known as:  PRAVACHOL  Take 40 mg by mouth daily.     predniSONE 20 MG tablet  Commonly known as:  DELTASONE  Take 60 mg for 2 days then 40mg  for 2 days and then 20 mg until finished        Disposition and follow-up:   [name] was discharged from [facility] in Stable condition.  At the hospital follow up visit please address   1. Ensure compliance with Acyclovir and prednisone 2. Assess resolution of bell's palsy 3. Follow up on intracranial mass. Consider outpatient MRI 4. Poorly controlled DM with Hb A1C 9.0, outpatient management 5. Follow up on her BP.   Follow-up Appointments:       Future Appointments Provider Department Dept Phone   12/10/2012 10:30 AM Judie Bonus, MD  [facility] [phone]   12/30/2012 2:15 PM Bronson Curb, MD [facility] [phone]      Consultations:  None  Procedures Performed:  X-ray Chest Pa And Lateral   11/25/2012  *RADIOLOGY REPORT*  Clinical Data: Dysphagia, hypertension, diabetes, possible stroke  CHEST - 2 VIEW  Comparison: 01/02/2012  Findings: Normal heart size and vascularity.  No focal pneumonia, collapse, consolidation, edema, effusion or pneumothorax.  Trachea is midline.  Monitor leads overlie the chest.  Stable exam.  IMPRESSION: No acute chest finding by plain radiography   Original Report Authenticated By: Judie Petit. Miles Costain, M.D.    Ct Head Wo Contrast  11/25/2012  *RADIOLOGY REPORT*  Clinical Data: 57 year old female with right facial droop, right ear and neck pain.  CT HEAD WITHOUT CONTRAST CT NECK WITH CONTRAST  Technique:  Contiguous axial images were obtained from the base of the skull through the vertex without contrast. Multidetector CT imaging of the neck was performed using the standard protocol without intravenous contrast.  Comparison:  Head CT without contrast 05/10/2004.  Cervical spine radiographs 07/28/2010.  CT HEAD  Findings: Visualized paranasal sinuses and mastoids are clear. Visualized orbit soft tissues are within normal limits.  Visualized scalp soft tissues are within normal limits.  Calvarium intact.  There is a small low density focus in the left posterior hemisphere at or near the tentorium with densitometry of fat (-22 Hounsfield units) measuring 6-7 mm in diameter today, 2 mm diameter 9 years earlier.  No significant mass effect.  No surrounding edema.  No other intracranial mass lesion.  No ventriculomegaly.  Normal cerebral volume. Gray-white matter differentiation is within normal limits throughout the brain.  No evidence of cortically based acute infarction identified.  No acute intracranial hemorrhage identified.  No suspicious intracranial vascular  hyperdensity.  IMPRESSION: 1.  Left posterior hemisphere low density lesion with modest growth since 2005 seems most compatible with intracranial dermoid/epidermoid. No associated mass effect or edema; favor this is incidental and clinically silent. Nonemergent MRI without and with contrast would confirm. 2.  Otherwise stable and negative noncontrast CT appearance of the brain. 3.  Neck findings are below. 4.  Right middle ear and mastoids are clear.  CT NECK  Contrast:  80 ml Omnipaque-300.  Findings: Atelectasis and paraseptal emphysema in the visible lungs.  No superior mediastinal lymphadenopathy.  Mild thyromegaly but no discrete thyroid lesion.  Larynx, pharynx, parapharyngeal spaces, retropharyngeal space and sublingual space are within normal limits.  Parotid and submandibular glands are within normal limits.  No cervical lymphadenopathy; right level IIB nodes are asymmetrically increased in number but remain within normal limits by size criteria.  Small lymph nodes elsewhere appears symmetric.  Visualized major vascular structures are patent.  No acute osseous abnormality identified.  Degenerative changes in the cervical spine.  Ankylosis of the right C4-C5 posterior elements.  Facet and disc degeneration elsewhere.  On these images the left posterior hemisphere lesion does appear to abut the tentorium.  No enhancement.  IMPRESSION: No soft tissue abnormality identified in the neck. Cervical spine degeneration.  Paraseptal emphysema.   Original Report Authenticated By: Erskine Speed, M.D.    Ct Soft Tissue Neck W Contrast  11/25/2012  *RADIOLOGY REPORT*  Clinical Data: 57 year old female with right facial droop, right ear and neck pain.  CT HEAD WITHOUT CONTRAST CT NECK WITH CONTRAST  Technique:  Contiguous axial images were obtained from the base of the skull through the vertex without contrast. Multidetector CT imaging of the neck was performed using the standard protocol without intravenous contrast.   Comparison:  Head CT without contrast 05/10/2004.  Cervical spine radiographs 07/28/2010.  CT HEAD  Findings: Visualized paranasal sinuses and mastoids are clear. Visualized orbit soft tissues are within normal limits.  Visualized scalp soft tissues are within normal limits.  Calvarium intact.  There is a small low density focus in the left posterior hemisphere at or near the tentorium with densitometry of fat (-22 Hounsfield units) measuring 6-7 mm in diameter today, 2 mm diameter 9 years earlier.  No significant mass effect.  No surrounding edema.  No other intracranial mass lesion.  No ventriculomegaly.  Normal cerebral volume. Gray-white matter differentiation is within normal limits throughout the brain.  No evidence of cortically based acute infarction identified.  No acute intracranial hemorrhage identified.  No suspicious intracranial vascular hyperdensity.  IMPRESSION: 1.  Left posterior hemisphere low density lesion with modest growth since 2005 seems most compatible with intracranial dermoid/epidermoid. No associated mass effect or edema; favor this is incidental and clinically silent. Nonemergent MRI without and with contrast would confirm. 2.  Otherwise stable and negative noncontrast CT appearance of the brain. 3.  Neck findings are below. 4.  Right middle ear and mastoids are clear.  CT NECK  Contrast:  80 ml Omnipaque-300.  Findings: Atelectasis and paraseptal emphysema in the visible lungs.  No superior mediastinal lymphadenopathy.  Mild thyromegaly but no discrete thyroid lesion.  Larynx, pharynx, parapharyngeal spaces, retropharyngeal space and sublingual space are within normal limits.  Parotid and submandibular glands are within normal limits.  No cervical lymphadenopathy; right level IIB nodes are asymmetrically increased in number but remain within normal limits by size criteria.  Small lymph nodes elsewhere appears symmetric.  Visualized major vascular structures are patent.  No acute osseous  abnormality identified.  Degenerative changes in the cervical spine.  Ankylosis of the right C4-C5 posterior elements.  Facet and disc degeneration elsewhere.  On these images the left posterior hemisphere lesion does appear to abut the tentorium.  No enhancement.  IMPRESSION: No soft tissue abnormality identified in the neck. Cervical spine degeneration.  Paraseptal emphysema.   Original Report Authenticated By: Erskine Speed, M.D.    Admission HPI:  Ms. Mccully is a 57 yo woman who presents with worsening right ear and eye pain and right-sided facial droop. The pain started 2/7. Patient was seen in clinic on 2/11 because of increasing pain and diagnosed with viral URI. Patient returned to ED on 2/14 for worsening symptoms, where she was diagnosed her with herpes zoster and started on acyclovir. On 2/17, patient saw ophthalmology who diagnosed her with dry eyes. Patient returned to clinic once again today for unresolved pain in her ear and eye, unresolved facial weakness, and new pain in her neck with difficulty swallowing. 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 Patient denies fever, cough, or recent sick contacts. Patient recalls a similar episode of facial weakness in 2010. Although the chart says this was Bell Palsy on the left, patient believes it was on the right. Patient denies recent travel or tick bites   Hospital Course by problem list: 1. Herpes Zoster infection, Bell's Palsy and Neck pain Patient presented with more than 1 week of facial droop on right side and history of the same in 2010. Concern for Herpes zoster and seen by ophthalmology who did not find any lesions. Her symptom was persistent and acyclovir was started but no improvement. No skin lesions noted on admission. Head/Neck CT done and did not show any fluid collection. The etiology is likely viral from herpes zoster. The CVA,  vasculitis, HIV, Syphilis, Lyme disease, hypothyroidism and autoimmune are less likely with negative work up. There was initial concern over questionable swallowing difficulties which was cleared by Speech therapist evaluation upon discharge. She is stable and ready to go home. She is discharged with prednisone tapering dose and to continue to finish her prescription of acyclovir 400 mg 5 times a day. A follow up appointment with our clinic was set up for her.   4. DM type 2, poorly controlled  Patient's A1c is 9.0 on admission. Will continue her home medication and defer it to outpatient management.   5. Hypertension, continue outpatient management  6. Intracranial mass CT head noted a non-enhancing, low density lesion in the left posterior hemisphere approximately 6mm in size. This same mass was appreciated in 2005 but was only 2mm then. There is no evidence of mass effect. Patient will need outpatient follow-up with MRI with and without contrast.  Discharge Vitals:  BP 156/85  Pulse 68  Temp(Src) 97.3 F (36.3  C) (Oral)  Resp 18  Ht 5\' 5"  (1.651 m)  Wt 179 lb 0.2 oz (81.2 kg)  BMI 29.79 kg/m2  SpO2 98%  Discharge Labs: No results found for this or any previous visit (from the past 24 hour(s)).  Signed: Leavy Heatherly 11/27/2012, 7:44 AM   Time Spent on Discharge: > 35 minutes Services Ordered on Discharge: none Equipment Ordered on Discharge: none

## 2012-11-27 NOTE — Care Management Note (Signed)
    Page 1 of 1   11/27/2012     8:17:11 AM   CARE MANAGEMENT NOTE 11/27/2012  Patient:  Bridget, Owens   Account Number:  [id number]  Date Initiated:  11/26/2012  Documentation initiated by:  [facility]  Subjective/Objective Assessment:   admitted with facial droop     Action/Plan:   plan return home   Anticipated DC Date:  11/26/2012   Anticipated DC Plan:  HOME/SELF CARE      DC Planning Services  CM consult      Choice offered to / List presented to:             Status of service:  Completed, signed off Medicare Important Message given?   (If response is "NO", the following Medicare IM given date fields will be blank) Date Medicare IM given:   Date Additional Medicare IM given:    Discharge Disposition:  HOME/SELF CARE  Per UR Regulation:  Reviewed for med. necessity/level of care/duration of stay  If discussed at Long Length of Stay Meetings, dates discussed:    Comments:

## 2012-12-10 ENCOUNTER — Other Ambulatory Visit (HOSPITAL_COMMUNITY)
Admission: RE | Admit: 2012-12-10 | Discharge: 2012-12-10 | Disposition: A | Discharge status: Home / Self Care | Payer: Medicaid Other | Source: Ambulatory Visit | Attending: Internal Medicine | Admitting: Internal Medicine

## 2012-12-10 ENCOUNTER — Ambulatory Visit (INDEPENDENT_AMBULATORY_CARE_PROVIDER_SITE_OTHER): Payer: Medicaid Other | Admitting: Internal Medicine

## 2012-12-10 ENCOUNTER — Encounter: Payer: Self-pay | Admitting: Internal Medicine

## 2012-12-10 VITALS — BP 157/99 | HR 81 | Temp 97.0°F

## 2012-12-10 DIAGNOSIS — R900 Intracranial space-occupying lesion found on diagnostic imaging of central nervous system: Secondary | ICD-10-CM

## 2012-12-10 DIAGNOSIS — G510 Bell's palsy: Secondary | ICD-10-CM

## 2012-12-10 DIAGNOSIS — E119 Type 2 diabetes mellitus without complications: Secondary | ICD-10-CM

## 2012-12-10 DIAGNOSIS — Z1151 Encounter for screening for human papillomavirus (HPV): Secondary | ICD-10-CM | POA: Insufficient documentation

## 2012-12-10 DIAGNOSIS — G939 Disorder of brain, unspecified: Secondary | ICD-10-CM

## 2012-12-10 DIAGNOSIS — E785 Hyperlipidemia, unspecified: Secondary | ICD-10-CM

## 2012-12-10 DIAGNOSIS — I10 Essential (primary) hypertension: Secondary | ICD-10-CM

## 2012-12-10 DIAGNOSIS — R220 Localized swelling, mass and lump, head: Secondary | ICD-10-CM

## 2012-12-10 DIAGNOSIS — Z01419 Encounter for gynecological examination (general) (routine) without abnormal findings: Secondary | ICD-10-CM | POA: Insufficient documentation

## 2012-12-10 DIAGNOSIS — G9389 Other specified disorders of brain: Secondary | ICD-10-CM

## 2012-12-10 DIAGNOSIS — Z0000 Encounter for general adult medical examination without abnormal findings: Secondary | ICD-10-CM

## 2012-12-10 LAB — LIPID PANEL
Cholesterol: 157 mg/dL (ref 0–200)
HDL: 41 mg/dL (ref >39)
LDL Cholesterol: 99 mg/dL (ref 0–99)
Total CHOL/HDL Ratio: 3.8 Ratio
Triglycerides: 86 mg/dL (ref <150)
VLDL: 17 mg/dL (ref 0–40)

## 2012-12-10 MED ORDER — GLIMEPIRIDE 2 MG PO TABS: 2.0000 mg | ORAL_TABLET | Freq: Every day | ORAL | Status: DC

## 2012-12-10 MED ORDER — BENZONATATE 100 MG PO CAPS: 100.0000 mg | ORAL_CAPSULE | Freq: Four times a day (QID) | ORAL | Status: DC | PRN

## 2012-12-10 MED ORDER — GABAPENTIN 300 MG PO CAPS: 300.0000 mg | ORAL_CAPSULE | Freq: Two times a day (BID) | ORAL | Status: DC | PRN

## 2012-12-10 NOTE — Patient Instructions (Signed)
We will have you get the test of your brain and will call you with the time.   Start taking a new sugar medicine called glimiperide (amaryl) in the morning.  Come back in 3 months to check on your blood pressure and sugars.   We will give you a medicine called gabapentin to help with the pain in your ear and you can take 1 pill per day for the first week. Then you can take 1 pill up to 2 times per day after that.

## 2012-12-10 NOTE — Assessment & Plan Note (Signed)
Patient unable to take acyclovir and took prednisone. Seems to be resolving although will use gabapentin short term for ear pain.

## 2012-12-10 NOTE — Assessment & Plan Note (Signed)
Recheck lipid panel today however may need escalation of therapy.

## 2012-12-10 NOTE — Progress Notes (Signed)
Subjective:     Patient ID: Bridget Owens, female   DOB: 1956-09-13, 57 y.o.   MRN: [id number]  HPI The patient is a 57 YO female who was recently in the hospital with bell's palsy of unknown origin. She was discharged on acyclovir and prednisone. She was unable to take acyclovir do to intolerance. She states that she's still having some ear pain however is most of her symptoms are starting to resolve. She is not having any new problems however she does have a wart on her left foot. She states that she is taking her other medications as prescribed. She is agreeable to have Pap smear today.  Review of Systems  Constitutional: Negative for fever, chills, diaphoresis, activity change, appetite change, fatigue and unexpected weight change.  HENT: Positive for ear pain, congestion, rhinorrhea, postnasal drip and sinus pressure. Negative for hearing loss, nosebleeds, sore throat, facial swelling, sneezing, drooling, mouth sores, trouble swallowing, neck pain, neck stiffness, dental problem, voice change, tinnitus and ear discharge.   Eyes: Positive for discharge. Negative for photophobia, pain, redness, itching and visual disturbance.       Some tearing of the eyes since episode of bell's palsy  Respiratory: Negative for cough, chest tightness, shortness of breath and wheezing.   Cardiovascular: Negative for chest pain, palpitations and leg swelling.  Gastrointestinal: Negative for nausea, vomiting, abdominal pain, diarrhea, constipation and abdominal distention.  Endocrine: Negative.   Genitourinary: Negative.  Negative for vaginal bleeding, vaginal discharge and vaginal pain.  Musculoskeletal: Negative.   Skin: Negative.   Neurological: Positive for facial asymmetry. Negative for dizziness, tremors, seizures, syncope, speech difficulty, weakness, light-headedness, numbness and headaches.       Some mild droop of right side face residual from bell's palsy.       Objective:   Physical Exam   Constitutional: She is oriented to person, place, and time. She appears well-developed and well-nourished.  Obese  HENT:  Head: Normocephalic and atraumatic.  Eyes: EOM are normal. Pupils are equal, round, and reactive to light.  Neck: Normal range of motion. Neck supple. No JVD present. No thyromegaly present.  Cardiovascular: Normal rate and regular rhythm.   Pulmonary/Chest: Effort normal. No respiratory distress. She has no wheezes.  Abdominal: Soft. Bowel sounds are normal. She exhibits no distension. There is no tenderness. There is no rebound and no guarding.  Genitourinary: Vagina normal and uterus normal. No vaginal discharge found.  Musculoskeletal: Normal range of motion. She exhibits no edema and no tenderness.  Neurological: She is alert and oriented to person, place, and time. No cranial nerve deficit.  Skin: Skin is dry.       Assessment/Plan:   1. Please see problem oriented charting.  2. Hospital follow up - patient seems to be doing reasonably well after discharge. No changes and will use gabapentin for her ear pain. Advised her that may take several months for all the symptoms to fully resolve from her episode of Bell's palsy.  3. Disposition - Pap smear done at today's visit. Will order MRI of brain with and without contrast to evaluate the lesion found on CT scan during hospital stay. Will add Amaryl for her diabetes. She likely needs additional blood pressure medication however to avoid confusion would like to only add one new medication at today's visit. At next visit should be placed on additional blood pressure medication. She declines flu shot. Will hold off on mammogram as we are doing imaging of brain. Check lipids at today's visit.

## 2012-12-10 NOTE — Assessment & Plan Note (Signed)
Ct head during recent hospital stay shows growth of hypodense lesion and recommend out-patient MRI brain with and without contrast. Ordered at today's visit.

## 2012-12-10 NOTE — Assessment & Plan Note (Signed)
BP Readings from Last 3 Encounters:  12/10/12 157/99  11/26/12 156/85  11/25/12 161/89    Lab Results  Component Value Date   NA 142 11/26/2012   K 3.9 11/26/2012   CREATININE 0.55 11/26/2012    Assessment:  Blood pressure control: mildly elevated  Progress toward BP goal:  unchanged  Comments: will need titration at next visit, did not want to confuse by adding too many medicines at one visit  Plan:  Medications:  continue current medications, chlorthalidone 25 mg daily, losartan 100 mg daily  Educational resources provided:    Self management tools provided: home blood pressure logbook  Other plans:

## 2012-12-10 NOTE — Assessment & Plan Note (Signed)
Lab Results  Component Value Date   HGBA1C 9.0* 11/25/2012   HGBA1C 8.6 09/19/2012   HGBA1C 11.2 07/01/2012     Assessment:  Diabetes control: poor control (HgbA1C >9%)  Progress toward A1C goal:  deteriorated  Comments: patient takes metformin 1000 once a day as she cannot remember to take medicines at night  Plan:  Medications:  metformin 1000 mg daily, start glimiperide 2 mg daily  Home glucose monitoring:   Frequency:     Timing:    Instruction/counseling given: reminded to bring blood glucose meter & log to each visit and discussed the need for weight loss  Educational resources provided:    Self management tools provided: home glucose logbook  Other plans: Patient is on ARB

## 2012-12-17 MED ORDER — FLUCONAZOLE 150 MG PO TABS: 150.0000 mg | ORAL_TABLET | Freq: Once | ORAL | Status: DC

## 2012-12-17 NOTE — Addendum Note (Signed)
Addended by: Genella Mech A on: 12/17/2012 08:54 AM   Modules accepted: Orders

## 2012-12-22 ENCOUNTER — Other Ambulatory Visit: Payer: Self-pay | Admitting: Internal Medicine

## 2012-12-22 ENCOUNTER — Encounter: Payer: Self-pay | Admitting: Internal Medicine

## 2012-12-22 DIAGNOSIS — Z860100 Personal history of colon polyps, unspecified: Secondary | ICD-10-CM | POA: Insufficient documentation

## 2012-12-22 NOTE — Progress Notes (Unsigned)
Patient ID: Bridget Owens, female   DOB: 12-05-1955, 57 y.o.   MRN: [id number] PANEL MANAGEMENT   [name]  is currently a chronic no-show patient, with last clinic visit on 12/10/12, for a(n) hospital follow-up visit. She has multiple chronic medical problems that have been updated to reflect current status.   These chronic medical issues are as listed below:  Past Medical History  Diagnosis Date  . Bell's palsy 01/2009    Left sided  . Type II diabetes mellitus   . Hypertension   . Hyperlipidemia   . Allergic rhinitis   . Intolerance, drug     Leg cramps on Lipitor  . GERD (gastroesophageal reflux disease)   . Uterine fibroid   . Chest pain     Exercise stress test negative 6/07  . Postmenopausal symptoms     Hot flashes, vaginal dryness, iritability, difficulty sleeping. as of 2005.  Marland Kitchen Insomnia   . External hemorrhoid   . Lipoma 12/11    Posterior neck.   . Osteoarthritis     Carpometacarpal joint of right thumb  . Trigger finger     Right thumb  . Sebaceous cyst of breast     Has been refered to derm.  . Adenomatous colon polyp 6/09    Resected on colonoscopy, no high grade dysplasia.   . Hypertensive retinopathy     Followed by Dr. Elmer Picker.  . Diabetic peripheral neuropathy   . Postmenopausal bleeding 9/05    Endometrial biopsy showed  FOCAL TUBAL METAPLASIA   . Colon polyps     03/22/2008: adenomatous polyps x3 w no dysplasia. Needs repeat colonoscopy in 3 years     I spoke with the patient's pharmacy [facility] ), who indicate that her refills history is as below:  Medication  Last Refill Being Appropriately Refilled?  chlorthalidone 12/11/12 Yes   gabapentin 12/11/12 Yes   glimepiride 12/11/12 Yes   losartan 11/26/12 Yes   metformin 12/16/12 Yes   pravastatin 11/26/12 Yes   Fluconazole - one dose Did not fill      The patient does not currently have scheduled follow-up at [facility]. Therefore, does need to be called to request appointment. If appointment is  needed, a message has been sent to front desk to request an appointment.    Bronson Curb 12/22/2012, 2:00 PM

## 2012-12-30 ENCOUNTER — Encounter: Payer: Medicaid Other | Admitting: Internal Medicine

## 2013-01-07 ENCOUNTER — Other Ambulatory Visit: Payer: Self-pay | Admitting: *Deleted

## 2013-01-07 NOTE — Telephone Encounter (Signed)
Pt calls and states she needs script for diflucan due to itching and burning in vaginal area

## 2013-01-08 MED ORDER — FLUCONAZOLE 150 MG PO TABS: 150.0000 mg | ORAL_TABLET | Freq: Once | ORAL | Status: DC

## 2013-01-17 ENCOUNTER — Other Ambulatory Visit: Payer: Self-pay | Admitting: Internal Medicine

## 2013-02-03 ENCOUNTER — Encounter: Payer: Self-pay | Admitting: Internal Medicine

## 2013-02-03 ENCOUNTER — Ambulatory Visit (INDEPENDENT_AMBULATORY_CARE_PROVIDER_SITE_OTHER): Payer: Medicaid Other | Admitting: Internal Medicine

## 2013-02-03 VITALS — BP 142/76 | HR 84 | Temp 96.8°F | Ht 65.0 in | Wt 174.2 lb

## 2013-02-03 DIAGNOSIS — N898 Other specified noninflammatory disorders of vagina: Secondary | ICD-10-CM

## 2013-02-03 DIAGNOSIS — B9689 Other specified bacterial agents as the cause of diseases classified elsewhere: Secondary | ICD-10-CM | POA: Insufficient documentation

## 2013-02-03 DIAGNOSIS — E1159 Type 2 diabetes mellitus with other circulatory complications: Secondary | ICD-10-CM

## 2013-02-03 DIAGNOSIS — L293 Anogenital pruritus, unspecified: Secondary | ICD-10-CM

## 2013-02-03 DIAGNOSIS — E119 Type 2 diabetes mellitus without complications: Secondary | ICD-10-CM

## 2013-02-03 DIAGNOSIS — G510 Bell's palsy: Secondary | ICD-10-CM

## 2013-02-03 DIAGNOSIS — I10 Essential (primary) hypertension: Secondary | ICD-10-CM

## 2013-02-03 DIAGNOSIS — R900 Intracranial space-occupying lesion found on diagnostic imaging of central nervous system: Secondary | ICD-10-CM

## 2013-02-03 DIAGNOSIS — Z0000 Encounter for general adult medical examination without abnormal findings: Secondary | ICD-10-CM | POA: Insufficient documentation

## 2013-02-03 DIAGNOSIS — R220 Localized swelling, mass and lump, head: Secondary | ICD-10-CM

## 2013-02-03 LAB — GLUCOSE, CAPILLARY: Glucose-Capillary: 251 mg/dL — ABNORMAL HIGH (ref 70–99)

## 2013-02-03 MED ORDER — ACCU-CHEK NANO SMARTVIEW W/DEVICE KIT: 1.0000 | PACK | Freq: Two times a day (BID) | Status: DC

## 2013-02-03 MED ORDER — FLUCONAZOLE 150 MG PO TABS: 150.0000 mg | ORAL_TABLET | Freq: Once | ORAL | Status: DC

## 2013-02-03 MED ORDER — METFORMIN HCL 500 MG PO TABS: 1000.0000 mg | ORAL_TABLET | Freq: Two times a day (BID) | ORAL | Status: DC

## 2013-02-03 MED ORDER — LOSARTAN POTASSIUM 100 MG PO TABS: 100.0000 mg | ORAL_TABLET | Freq: Every day | ORAL | Status: DC

## 2013-02-03 NOTE — Assessment & Plan Note (Signed)
Ct head during recent hospital stay showed growth of hypodense lesion and recommend out-patient MRI brain with and without contrast. Other than her right-sided facial droop from chronic Bell's palsy, she did not have any other appreciable neurologic deficits on exam. Dr. Dorise Hiss order this at patient's last visit, but she did not complete. Submitted again for prior authorization today. Instructed patient is to be called with appointment date and time. Emphasized importance of followup with her.

## 2013-02-03 NOTE — Assessment & Plan Note (Signed)
Patient reports vaginal pruritus with minimal white discharge, no significant odor. Reports she's not been sexually active in 3 years. No vaginal bleeding or abd pain. Had a normal Pap smear an appointment last month. She would prefer to defer vaginal exam today. Suspect vaginal candidiasis, related to hyperglycemia and poorly controlled diabetes. -Will treat empirically with Diflucan x1  - instructed her to call, return for examination if symptoms do not improve

## 2013-02-03 NOTE — Patient Instructions (Addendum)
1. INCREASE metformin to 2 pills twice a day.  2. TAKE a new medicine for diabetes, waiting at your pharmacy called glimepiride. 3. You can take diflucan once for itching, call me back if it does not help. 4. PLEASE get your MRI scan done when we call you with appointment. 5. Come back and see me in 1 month

## 2013-02-03 NOTE — Progress Notes (Signed)
Patient ID: Bridget Owens, female   DOB: 12/03/1955, 57 y.o.   MRN: [id number]  Subjective:   Patient ID: Bridget Owens female   DOB: December 03, 1955 57 y.o.   MRN: [id number]  HPI: Bridget Owens is a 57 y.o. female with history of diabetes, hypertension, GERD, and chronic right-sided Bell's palsy who presents for followup. She was seen about 2 months ago for hospital followup for right-sided Bell's palsy and concern for herpes zoster. She reports this is improving gradually. She is able to close her eye and is performing eye lubrication at home. No changes in right eye vision. No pain of right face. She is following with an ophthalmologist, and says she's due to call and make an appointment. Of concern during her recent hospitalization was a CT scan of the head which showed left posterior intracranial hypodense lesion which showed some interval growth since 2005, possibly related to benign dermoid growth, but followup outpatient MRI was recommended. This was ordered at her last clinic visit, but she did not followup. In regards to her hypertension, she tells that she's taking 2 medicines that she cannot remember the names of them. Denies headaches, chest pain, shortness of breath, palpitations. In terms of her diabetes, her last HbA1c was 9.0 and glimepiride was added to her metformin. She reports to me today that she never filled the second medication, and has continued to take metformin 500 mg twice a for diabetes. She says she lost the battery to her meter, and then lost her meter completely and so some measure blood sugars at home. She denies any symptoms of hyperglycemia such as polyuria and has not had any hypoglycemic symptoms. She does report recurrent vaginal itching with minimal white discharge without significant odor. This is responded to Diflucan in the past. She's not sexually active for 3 years. She declines a repeat vaginal exam today. For her health maintenance, she is up-to-date on Pap smear  which was performed at her last office visit and was normal. She is due for a colonoscopy to followup on colonic polyps seen in 2009. She is overdue for her mammogram as well. She denies any blood in her stool or appreciable breast pain or lumps.   Past Medical History  Diagnosis Date  . Bell's palsy 01/2009    Left sided  . Type II diabetes mellitus   . Hypertension   . Hyperlipidemia   . Allergic rhinitis   . Intolerance, drug     Leg cramps on Lipitor  . GERD (gastroesophageal reflux disease)   . Uterine fibroid   . Chest pain     Exercise stress test negative 6/07  . Postmenopausal symptoms     Hot flashes, vaginal dryness, iritability, difficulty sleeping. as of 2005.  Marland Kitchen Insomnia   . External hemorrhoid   . Lipoma 12/11    Posterior neck.   . Osteoarthritis     Carpometacarpal joint of right thumb  . Trigger finger     Right thumb  . Sebaceous cyst of breast     Has been refered to derm.  . Adenomatous colon polyp 6/09    Resected on colonoscopy, no high grade dysplasia.   . Hypertensive retinopathy     Followed by Dr. Elmer Picker.  . Diabetic peripheral neuropathy   . Postmenopausal bleeding 9/05    Endometrial biopsy showed  FOCAL TUBAL METAPLASIA   . Colon polyps     03/22/2008: adenomatous polyps x3 w no dysplasia. Needs repeat colonoscopy in 3 years  Current Outpatient Prescriptions  Medication Sig Dispense Refill  . aspirin 81 MG EC tablet Take 81 mg by mouth daily.        . benzonatate (TESSALON PERLES) 100 MG capsule Take 1 capsule (100 mg total) by mouth every 6 (six) hours as needed for cough.  30 capsule  0  . Blood Glucose Monitoring Suppl (ACCU-CHEK NANO SMARTVIEW) W/DEVICE KIT 1 each by Does not apply route 2 (two) times daily. Dx code 250.00  1 kit  0  . chlorthalidone (HYGROTON) 25 MG tablet TAKE ONE TABLET BY MOUTH ONE TIME DAILY  30 tablet  5  . diphenhydrAMINE (BENADRYL) 25 MG tablet Take 25 mg by mouth every 6 (six) hours as needed for allergies.       . fluconazole (DIFLUCAN) 150 MG tablet Take 1 tablet (150 mg total) by mouth once.  2 tablet  0  . gabapentin (NEURONTIN) 300 MG capsule Take 1 capsule (300 mg total) by mouth 2 (two) times daily as needed.  60 capsule  0  . glimepiride (AMARYL) 2 MG tablet Take 1 tablet (2 mg total) by mouth daily before breakfast.  30 tablet  1  . losartan (COZAAR) 100 MG tablet Take 1 tablet (100 mg total) by mouth daily.  30 tablet  5  . metFORMIN (GLUCOPHAGE) 500 MG tablet Take 2 tablets (1,000 mg total) by mouth 2 (two) times daily with a meal.  120 tablet  5  . pravastatin (PRAVACHOL) 20 MG tablet Take 40 mg by mouth daily.      . [DISCONTINUED] pantoprazole (PROTONIX) 20 MG tablet Take 1 tablet (20 mg total) by mouth daily.  30 tablet  0   No current facility-administered medications for this visit.   Family History  Problem Relation Age of Onset  . Pneumonia Mother   . Diabetes Mother   . Early death Father   . Diabetes Sister   . Diabetes Brother   . Diabetes Maternal Grandmother    History   Social History  . Marital Status: Single    Spouse Name: N/A    Number of Children: N/A  . Years of Education: N/A   Social History Main Topics  . Smoking status: Current Every Day Smoker -- 0.75 packs/day for 30 years    Types: Cigarettes  . Smokeless tobacco: Former Neurosurgeon    Quit date: 11/28/2010     Comment: trying to quit .  Doing a little less now.  . Alcohol Use: No  . Drug Use: No  . Sexually Active: None   Other Topics Concern  . None   Social History Narrative  . None   Review of Systems: 10 pt ROS performed, pertinent positives and negatives noted in HPI Objective:  Physical Exam: Filed Vitals:   02/03/13 1541  BP: 142/76  Pulse: 84  Temp: 96.8 F (36 C)  TempSrc: Oral  Height: 5\' 5"  (1.651 m)  Weight: 174 lb 3.2 oz (79.017 kg)  SpO2: 99%   Constitutional: Vital signs reviewed.  Patient is a well-developed and well-nourished female in no acute distress and  cooperative with exam. Alert and oriented x3.  Head: Normocephalic and atraumatic Eyes:R ptosis noted along w R facial droop. No gross evidence of corneal scarring or erythematous conjunctiva.  PERRL, EOMI  Neck: Supple, Trachea midline normal ROM; no JVD, mass, thyromegaly.  Cardiovascular: RRR, S1 normal, S2 normal, no MRG, pulses symmetric and intact bilaterally Pulmonary/Chest: CTAB, no wheezes, rales, or rhonchi Abdominal: Soft. Non-tender, non-distended, bowel  sounds are normal, no masses, organomegaly, or guarding present.  Musculoskeletal: No joint deformities, erythema, or stiffness, ROM full and no nontender Hematology: no cervical adenopathy or visible bruising Neurological: R facial droop involving eye and mouth, patient reports intervally improved.  Skin: Warm, dry and intact. No rash, cyanosis, or clubbing.  Psychiatric: Normal mood and affect. Assessment & Plan:   Please see problem-based charting for assessment and plan.

## 2013-02-03 NOTE — Assessment & Plan Note (Signed)
BP Readings from Last 3 Encounters:  02/03/13 142/76  12/10/12 157/99  11/26/12 156/85    Lab Results  Component Value Date   NA 142 11/26/2012   K 3.9 11/26/2012   CREATININE 0.55 11/26/2012    Assessment: Blood pressure control:  At goal  Progress toward BP goal:   Improved Comments: Reports compliance w 2 BP meds but cannot name them  Plan: Medications:  continue current medications, Chlorthalidone 25mg  daily, losartan 100mg  daily Educational resources provided: brochure Self management tools provided: home blood pressure logbook

## 2013-02-03 NOTE — Assessment & Plan Note (Signed)
She is due for multiple health care maintenance interventions including repeat colonoscopy for colon polyps and mammogram. She denies any breast lumps or blood in her stool. Up-to-date on Pap smear. She's had a history of no-showing for some of these followup appointments. Today we'll prioritize the most important intervention, which I believe is repeat imaging of her brain to followup on the concerning interval growth of hypodense lesion seen on CT scan. Have asked her to come back in one month, at which time we'll make appropriate referrals for mammography and colonoscopy.

## 2013-02-03 NOTE — Assessment & Plan Note (Signed)
Lab Results  Component Value Date   HGBA1C 9.0* 11/25/2012   HGBA1C 8.6 09/19/2012   HGBA1C 11.2 07/01/2012     Assessment: Diabetes control:  Poor  Progress toward A1C goal:   Unable to assess Comments: reports that she has lost her glucose meter.  Plan: Medications:  Increase metformin to 1000 mg twice a day. Patient did not fill glimepiride prescribed by Dr. Audley Hose at last visit. Instructed her to please fill medication. Home glucose monitoring: Frequency:   Timing:   Instruction/counseling given: reminded to get eye exam, reminded to bring blood glucose meter & log to each visit and reminded to bring medications to each visit Educational resources provided: brochure Self management tools provided:

## 2013-02-04 ENCOUNTER — Telehealth: Payer: Self-pay | Admitting: Licensed Clinical Social Worker
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
## 2013-02-09 NOTE — Progress Notes (Signed)
INTERNAL MEDICINE TEACHING ATTENDING ADDENDUM - Inez Catalina, MD: I reviewed with the resident Dr. Heloise Beecham, Ms. Yetta Barre'  medical history, physical examination, diagnosis and results of tests and treatment and I agree with the patient's care as documented.

## 2013-02-09 NOTE — Telephone Encounter (Signed)
CSW received several return message from [name].  Attempted return call, unable to leave a message.

## 2013-02-17 ENCOUNTER — Telehealth: Payer: Self-pay | Admitting: Licensed Clinical Social Worker

## 2013-02-17 NOTE — Telephone Encounter (Signed)
CSW received call from [name]. Pt states DSS was unable to make referral to [facility] for furniture.  Pt aware of referral to [facility].  CSW spoke with Tonji S at [facility].  This agency is still a referring agency but must be providing ongoing management prior to referrals being made.  [facility] will follow up on referral.

## 2013-02-19 ENCOUNTER — Ambulatory Visit (HOSPITAL_COMMUNITY)
Admission: RE | Admit: 2013-02-19 | Discharge: 2013-02-19 | Disposition: A | Discharge status: Home / Self Care | Payer: Medicaid Other | Source: Ambulatory Visit | Attending: Internal Medicine | Admitting: Internal Medicine

## 2013-02-19 ENCOUNTER — Encounter (HOSPITAL_COMMUNITY): Payer: Self-pay

## 2013-02-19 DIAGNOSIS — G9389 Other specified disorders of brain: Secondary | ICD-10-CM | POA: Insufficient documentation

## 2013-02-19 DIAGNOSIS — I10 Essential (primary) hypertension: Secondary | ICD-10-CM | POA: Insufficient documentation

## 2013-02-19 DIAGNOSIS — E119 Type 2 diabetes mellitus without complications: Secondary | ICD-10-CM | POA: Insufficient documentation

## 2013-02-19 LAB — CREATININE, SERUM
Creatinine, Ser: 0.58 mg/dL (ref 0.50–1.10)
GFR calc Af Amer: >90 mL/min (ref >90)
GFR calc non Af Amer: >90 mL/min (ref >90)

## 2013-02-19 MED ORDER — GADOBENATE DIMEGLUMINE 529 MG/ML IV SOLN
16.0000 mL | Freq: Once | INTRAVENOUS | Status: AC
  Administered 2013-02-19: 16 mL via INTRAVENOUS

## 2013-02-20 ENCOUNTER — Encounter: Payer: Self-pay | Admitting: Internal Medicine

## 2013-02-28 ENCOUNTER — Other Ambulatory Visit: Payer: Self-pay | Admitting: Internal Medicine

## 2013-03-09 ENCOUNTER — Ambulatory Visit (INDEPENDENT_AMBULATORY_CARE_PROVIDER_SITE_OTHER): Payer: Medicaid Other | Admitting: Internal Medicine

## 2013-03-09 ENCOUNTER — Encounter: Payer: Self-pay | Admitting: Internal Medicine

## 2013-03-09 VITALS — BP 152/92 | HR 81 | Temp 96.6°F | Ht 65.9 in | Wt 173.6 lb

## 2013-03-09 DIAGNOSIS — Z860100 Personal history of colon polyps, unspecified: Secondary | ICD-10-CM

## 2013-03-09 DIAGNOSIS — Z1231 Encounter for screening mammogram for malignant neoplasm of breast: Secondary | ICD-10-CM

## 2013-03-09 DIAGNOSIS — D332 Benign neoplasm of brain, unspecified: Secondary | ICD-10-CM

## 2013-03-09 DIAGNOSIS — E785 Hyperlipidemia, unspecified: Secondary | ICD-10-CM

## 2013-03-09 DIAGNOSIS — G4700 Insomnia, unspecified: Secondary | ICD-10-CM

## 2013-03-09 DIAGNOSIS — R5381 Other malaise: Secondary | ICD-10-CM | POA: Insufficient documentation

## 2013-03-09 DIAGNOSIS — Z0000 Encounter for general adult medical examination without abnormal findings: Secondary | ICD-10-CM

## 2013-03-09 DIAGNOSIS — E1159 Type 2 diabetes mellitus with other circulatory complications: Secondary | ICD-10-CM

## 2013-03-09 DIAGNOSIS — D1779 Benign lipomatous neoplasm of other sites: Secondary | ICD-10-CM

## 2013-03-09 DIAGNOSIS — E119 Type 2 diabetes mellitus without complications: Secondary | ICD-10-CM

## 2013-03-09 DIAGNOSIS — I10 Essential (primary) hypertension: Secondary | ICD-10-CM

## 2013-03-09 LAB — POCT GLYCOSYLATED HEMOGLOBIN (HGB A1C): Hemoglobin A1C: 8.6

## 2013-03-09 LAB — GLUCOSE, CAPILLARY: Glucose-Capillary: 149 mg/dL — ABNORMAL HIGH (ref 70–99)

## 2013-03-09 MED ORDER — AMLODIPINE BESYLATE-VALSARTAN 5-320 MG PO TABS: 1.0000 | ORAL_TABLET | Freq: Every day | ORAL | Status: DC

## 2013-03-09 MED ORDER — ATORVASTATIN CALCIUM 40 MG PO TABS: 40.0000 mg | ORAL_TABLET | Freq: Every day | ORAL | Status: DC

## 2013-03-09 MED ORDER — GLIMEPIRIDE 2 MG PO TABS: 2.0000 mg | ORAL_TABLET | Freq: Every day | ORAL | Status: DC

## 2013-03-09 MED ORDER — IBUPROFEN 800 MG PO TABS: 800.0000 mg | ORAL_TABLET | Freq: Three times a day (TID) | ORAL | Status: DC | PRN

## 2013-03-09 MED ORDER — OLMESARTAN-AMLODIPINE-HCTZ 40-5-25 MG PO TABS: 1.0000 | ORAL_TABLET | Freq: Every day | ORAL | Status: DC

## 2013-03-09 MED ORDER — AMLODIPINE BESYLATE 5 MG PO TABS: 5.0000 mg | ORAL_TABLET | Freq: Every day | ORAL | Status: DC

## 2013-03-09 MED ORDER — METFORMIN HCL 1000 MG PO TABS: 1000.0000 mg | ORAL_TABLET | Freq: Two times a day (BID) | ORAL | Status: DC

## 2013-03-09 NOTE — Assessment & Plan Note (Signed)
Referral today for [facility].

## 2013-03-09 NOTE — Progress Notes (Signed)
Patient ID: Bridget Owens, female   DOB: January 11, 1956, 57 y.o.   MRN: [id number]  Subjective:   Patient ID: Bridget Owens female   DOB: 06-Dec-1955 57 y.o.   MRN: [id number]  HPI: [name] is a 57 y.o. female with history of diabetes, hypertension, GERD, and chronic right-sided Bell's palsy who presents for followup.   Of concern during her recent hospitalization was a CT scan of the head which showed left posterior intracranial hypodense lesion which showed some interval growth since 2005, possibly related to benign dermoid growth, but followup outpatient MRI was recommended. Since her last clinic visit, MRI was performed on 02/19/13  which showed 6.5 x 2.5 mm fat containing lesion along the undersurface of the left temporal lobe is most compatible with a benign lipoma.  In regards to her hypertension, she reports daily compliance with chlorthalidone 25mg  daily and losartan 100mg  daily. Denies headaches, chest pain, shortness of breath, palpitations. She has HTN retinopathy and follows with Dr. Elmer Picker. Due for follow up this month.    In terms of her diabetes, her last HbA1c was 9.0 and glimepiride was added to her metformin. At her last visit, she reported not yet filling the glimepiride. I discussed the importance of adding this medication and continuing metformin BID. I received communication from our CSW last week that patient had not filled glimepiride.  She did not bring in her meter today. A1c is 8.6 today.  She complains of fatigue and diffuse hair thinning. Fatigue has been worse over past 2 weeks. Reports poor sleep with increased sleep latency most days and feeling unrested in am. Snores but no known apneic events. Says she watches TV to calm down at night.  Denies any blood in stool/urine. No joint swelling or visual changes. Has had extensive fatigue w/u in past including HIV, TSH, ANA, CBC, B12, metabolic panel - all unrevealing.   For her health maintenance, she is up-to-date on Pap  smear/colonoscopy (records requested) and overdue for her mammogram.      Past Medical History  Diagnosis Date  . Bell's palsy 01/2009    Left sided  . Type II diabetes mellitus   . Hypertension   . Hyperlipidemia   . Allergic rhinitis   . Intolerance, drug     Leg cramps on Lipitor  . GERD (gastroesophageal reflux disease)   . Uterine fibroid   . Chest pain     Exercise stress test negative 6/07  . Postmenopausal symptoms     Hot flashes, vaginal dryness, iritability, difficulty sleeping. as of 2005.  Marland Kitchen Insomnia   . External hemorrhoid   . Lipoma 12/11    Posterior neck.   . Osteoarthritis     Carpometacarpal joint of right thumb  . Trigger finger     Right thumb  . Sebaceous cyst of breast     Has been refered to derm.  . Adenomatous colon polyp 6/09    Resected on colonoscopy, no high grade dysplasia.   . Hypertensive retinopathy     Followed by Dr. Elmer Picker.  . Diabetic peripheral neuropathy   . Postmenopausal bleeding 9/05    Endometrial biopsy showed  FOCAL TUBAL METAPLASIA   . Colon polyps     03/22/2008: adenomatous polyps x3 w no dysplasia. Needs repeat colonoscopy in 3 years   Current Outpatient Prescriptions  Medication Sig Dispense Refill  . amLODipine-valsartan (EXFORGE) 5-320 MG per tablet Take 1 tablet by mouth daily.  30 tablet  5  . aspirin  81 MG EC tablet Take 81 mg by mouth daily.        Marland Kitchen atorvastatin (LIPITOR) 40 MG tablet Take 1 tablet (40 mg total) by mouth daily.  30 tablet  11  . benzonatate (TESSALON PERLES) 100 MG capsule Take 1 capsule (100 mg total) by mouth every 6 (six) hours as needed for cough.  30 capsule  0  . Blood Glucose Monitoring Suppl (ACCU-CHEK NANO SMARTVIEW) W/DEVICE KIT 1 each by Does not apply route 2 (two) times daily. Dx code 250.00  1 kit  0  . chlorthalidone (HYGROTON) 25 MG tablet TAKE ONE TABLET BY MOUTH ONE TIME DAILY  30 tablet  5  . diphenhydrAMINE (BENADRYL) 25 MG tablet Take 25 mg by mouth every 6 (six) hours as  needed for allergies.      . fluconazole (DIFLUCAN) 150 MG tablet Take 1 tablet (150 mg total) by mouth once.  2 tablet  0  . gabapentin (NEURONTIN) 300 MG capsule Take 1 capsule (300 mg total) by mouth 2 (two) times daily as needed.  60 capsule  0  . glimepiride (AMARYL) 2 MG tablet Take 1 tablet (2 mg total) by mouth daily before breakfast.  30 tablet  5  . metFORMIN (GLUCOPHAGE) 1000 MG tablet Take 1 tablet (1,000 mg total) by mouth 2 (two) times daily with a meal.  60 tablet  5  . [DISCONTINUED] pantoprazole (PROTONIX) 20 MG tablet Take 1 tablet (20 mg total) by mouth daily.  30 tablet  0   No current facility-administered medications for this visit.   Family History  Problem Relation Age of Onset  . Pneumonia Mother   . Diabetes Mother   . Early death Father   . Diabetes Sister   . Diabetes Brother   . Diabetes Maternal Grandmother    History   Social History  . Marital Status: Single    Spouse Name: N/A    Number of Children: N/A  . Years of Education: N/A   Social History Main Topics  . Smoking status: Current Every Day Smoker -- 0.75 packs/day for 30 years    Types: Cigarettes  . Smokeless tobacco: Former Neurosurgeon    Quit date: 11/28/2010     Comment: trying to quit .  Doing a little less now.  . Alcohol Use: No  . Drug Use: No  . Sexually Active: None   Other Topics Concern  . None   Social History Narrative  . None   Review of Systems: 10 pt ROS performed, pertinent positives and negatives noted in HPI Objective:  Physical Exam: Filed Vitals:   03/09/13 0941  BP: 152/92  Pulse: 81  Temp: 96.6 F (35.9 C)  TempSrc: Oral  Height: 5' 5.9" (1.674 m)  Weight: 173 lb 9.6 oz (78.744 kg)  SpO2: 99%   Constitutional: Vital signs reviewed. Patient is a well-developed and well-nourished female in no acute distress and cooperative with exam. Alert and oriented x3.  Head:  Normocephalic and atraumatic  Eyes: Improving R facial droop. No gross evidence of corneal  scarring or erythematous conjunctiva. PERRL, EOMI  Neck: Supple, Trachea midline normal ROM; no JVD, mass, thyromegaly.  Cardiovascular: RRR, S1 normal, S2 normal, no MRG, pulses symmetric and intact bilaterally  Pulmonary/Chest: CTAB, no wheezes, rales, or rhonchi  Abdominal: Soft. Non-tender, non-distended, bowel sounds are normal, no masses, organomegaly, or guarding present.  Musculoskeletal: No joint deformities, erythema, or stiffness, ROM full and no nontender Hematology: no cervical adenopathy or visible bruising  Neurological: R facial droop involving eye and mouth, resolving since last visit.    Skin: Diffuse hair thinning on scalp, no scarring. Warm, dry and intact. No rash, cyanosis, or clubbing.  Psychiatric: Normal mood and affect.  Assessment & Plan:   Please see problem-based charting for assessment and plan.

## 2013-03-09 NOTE — Assessment & Plan Note (Signed)
BP Readings from Last 3 Encounters:  03/09/13 156/88  02/03/13 142/76  12/10/12 157/99    Lab Results  Component Value Date   NA 142 11/26/2012   K 3.9 11/26/2012   CREATININE 0.58 02/19/2013    Assessment: Blood pressure control: moderately elevated Progress toward BP goal:  unchanged Comments: Reports compliance with daily losartan and chlorthalidone  Plan: Medications:  Continue chlorthalidone 25mg  daily. Stop losartan, substitute Exforge 5/320 (amlodipine/valsartan, formulary preferred), titrate to 10/320 at visit next month if still hypertensive. If still issues w BP control, will try for prior-authorization of tribenzor as once daily dosing will help w med compliance.  Educational resources provided: brochure Self management tools provided:

## 2013-03-09 NOTE — Assessment & Plan Note (Addendum)
Patient reports poor sleep at night, increased sleep latency. Does not feel rested in am. She says she has been feeling even more tired recently. Snores but no apneic events that she knows of. Discussed sleep hygiene, personally reviewed strategies for decreasing sleep latency, minimizing stress. Patient eager to stay away from additional sleep aids. She will come back in 4 weeks for follow up. May need a sleep study to better characterize disorder. In terms of other etiologies of fatigue, has recent normal TSH, CBC, metabolic panel, ANA. Higher suspicion for primary vs secondary sleep disorder.

## 2013-03-09 NOTE — Assessment & Plan Note (Signed)
Lab Results  Component Value Date   CHOL 157 12/10/2012   HDL 41 12/10/2012   LDLCALC 99 12/10/2012   TRIG 86 12/10/2012   CHOLHDL 3.8 12/10/2012   Changing from pravastatin to atorvastatin 40mg  today

## 2013-03-09 NOTE — Assessment & Plan Note (Signed)
Lab Results  Component Value Date   HGBA1C 8.6 03/09/2013   HGBA1C 9.0* 11/25/2012   HGBA1C 8.6 09/19/2012     Assessment: Diabetes control: fair control Progress toward A1C goal:  improved Comments: Still has not filled glimepiride  Plan: Medications:  Continue metformin 1000 mg BID. instructed patient to PLEASE FILL glimepiride, which is awaiting pick up at her pharmacy Home glucose monitoring: Frequency: no home glucose monitoring Timing: N/A Instruction/counseling given: reminded to get eye exam and reminded to bring medications to each visit Educational resources provided: brochure Self management tools provided:

## 2013-03-09 NOTE — Patient Instructions (Addendum)
1. PLEASE fill all of your medicines at the pharmacy.  - You will start a new cholesterol medicine called atorvastatin. - Continue taking metformin 1000 mg TWICE A DAY - Please fill the glimepiride and take one tablet ONCE A DAY. - STOP TAKING losartan (Cozaar). Start Exforge once a day. Keep taking chlorthalidone once day.   2. Make an appointment to see your eye doctor.   3. Come back and see me in about 3-4 weeks  Insomnia Insomnia is frequent trouble falling and/or staying asleep. Insomnia can be a long term problem or a short term problem. Both are common. Insomnia can be a short term problem when the wakefulness is related to a certain stress or worry. Long term insomnia is often related to ongoing stress during waking hours and/or poor sleeping habits. Overtime, sleep deprivation itself can make the problem worse. Every little thing feels more severe because you are overtired and your ability to cope is decreased.  CAUSES   Stress, anxiety, and depression.  Poor sleeping habits.  Distractions such as TV in the bedroom.  Naps close to bedtime.  Engaging in emotionally charged conversations before bed.  Technical reading before sleep.  Alcohol and other sedatives. They may make the problem worse. They can hurt normal sleep patterns and normal dream activity.  Stimulants such as caffeine for several hours prior to bedtime.  Pain syndromes and shortness of breath can cause insomnia.  Exercise late at night.  Changing time zones may cause sleeping problems (jet lag). It is sometimes helpful to have someone observe your sleeping patterns. They should look for periods of not breathing during the night (sleep apnea). They should also look to see how long those periods last. If you live alone or observers are uncertain, you can also be observed at a sleep clinic where your sleep patterns will be professionally monitored. Sleep apnea requires a checkup and treatment. Give your  caregivers your medical history. Give your caregivers observations your family has made about your sleep.  SYMPTOMS   Not feeling rested in the morning.  Anxiety and restlessness at bedtime.  Difficulty falling and staying asleep. TREATMENT   Your caregiver may prescribe treatment for an underlying medical disorders. Your caregiver can give advice or help if you are using alcohol or other drugs for self-medication. Treatment of underlying problems will usually eliminate insomnia problems.  Medications can be prescribed for short time use. They are generally not recommended for lengthy use.  Over-the-counter sleep medicines are not recommended for lengthy use. They can be habit forming.  You can promote easier sleeping by making lifestyle changes such as:  Using relaxation techniques that help with breathing and reduce muscle tension.  Exercising earlier in the day.  Changing your diet and the time of your last meal. No night time snacks.  Establish a regular time to go to bed.  Counseling can help with stressful problems and worry.  Soothing music and white noise may be helpful if there are background noises you cannot remove.  Stop tedious detailed work at least one hour before bedtime. HOME CARE INSTRUCTIONS   Keep a diary. Inform your caregiver about your progress. This includes any medication side effects. See your caregiver regularly. Take note of:  Times when you are asleep.  Times when you are awake during the night.  The quality of your sleep.  How you feel the next day. This information will help your caregiver care for you.  Get out of bed if you  are still awake after 15 minutes. Read or do some quiet activity. Keep the lights down. Wait until you feel sleepy and go back to bed.  Keep regular sleeping and waking hours. Avoid naps.  Exercise regularly.  Avoid distractions at bedtime. Distractions include watching television or engaging in any intense or  detailed activity like attempting to balance the household checkbook.  Develop a bedtime ritual. Keep a familiar routine of bathing, brushing your teeth, climbing into bed at the same time each night, listening to soothing music. Routines increase the success of falling to sleep faster.  Use relaxation techniques. This can be using breathing and muscle tension release routines. It can also include visualizing peaceful scenes. You can also help control troubling or intruding thoughts by keeping your mind occupied with boring or repetitive thoughts like the old concept of counting sheep. You can make it more creative like imagining planting one beautiful flower after another in your backyard garden.  During your day, work to eliminate stress. When this is not possible use some of the previous suggestions to help reduce the anxiety that accompanies stressful situations. MAKE SURE YOU:   Understand these instructions.  Will watch your condition.  Will get help right away if you are not doing well or get worse. Document Released: 09/21/2000 Document Revised: 12/17/2011 Document Reviewed: 10/22/2007 [facility] Patient Information 2014 [place], [place].

## 2013-03-09 NOTE — Assessment & Plan Note (Signed)
02/19/13  MRI showed 6.5 x 2.5 mm fat containing lesion along the undersurface of the left temporal lobe is most compatible with a benign lipoma.

## 2013-03-09 NOTE — Assessment & Plan Note (Signed)
Up to date w last colonoscopy 2013. Request of results sent to [facility].

## 2013-03-12 ENCOUNTER — Telehealth: Payer: Self-pay | Admitting: Dietician

## 2013-03-12 ENCOUNTER — Ambulatory Visit (HOSPITAL_COMMUNITY): Payer: Medicaid Other

## 2013-03-12 NOTE — Telephone Encounter (Signed)
Thank you :)

## 2013-03-12 NOTE — Telephone Encounter (Signed)
Blood sugar 126 this am, concerned about blood pressure being high. Okay now because doctor gave hr more pills. Reviewed some lifestyle factors that affect blood pressure.  Patient interested in smoking cessation counseling from social work. Will request referral.

## 2013-03-13 NOTE — Telephone Encounter (Signed)
Thank you. Pt will be referred to the [facility].

## 2013-03-16 ENCOUNTER — Telehealth: Payer: Self-pay | Admitting: *Deleted

## 2013-03-16 ENCOUNTER — Other Ambulatory Visit: Payer: Self-pay | Admitting: Internal Medicine

## 2013-03-16 MED ORDER — LOSARTAN POTASSIUM 100 MG PO TABS: 100.0000 mg | ORAL_TABLET | Freq: Every day | ORAL | Status: DC

## 2013-03-16 MED ORDER — ATENOLOL-CHLORTHALIDONE 50-25 MG PO TABS: 1.0000 | ORAL_TABLET | Freq: Every day | ORAL | Status: DC

## 2013-03-16 NOTE — Telephone Encounter (Signed)
Pt called stating that the BP med you started her on, amlodipine-valsartan, caused a lot of itching.  She stopped the med and got better. Can you try something else? Pt # [id number]

## 2013-03-16 NOTE — Telephone Encounter (Signed)
Took care of it. Thanks cz

## 2013-03-16 NOTE — Progress Notes (Signed)
Spoke w patient on the phone. Had some itching w amlodipine-valsartan. Suspect related to amlodipine as she had been on ARB (losartan) in past without adverse event. Sx stopped when she stopped the medicine. Sent in refill of losartan 100mg  daily. Added new BP meds: atenolol/chlorthalidone 50-25mg  daily. Called and informed patient these are the only meds she should be taking for BP. She is in agreement.  Bronson Curb, MD  PGY-1, Internal Medicine Resident Pager: [phone] (7AM-5PM) 03/16/2013, 4:57 PM

## 2013-03-17 ENCOUNTER — Encounter: Payer: Self-pay | Admitting: Dietician

## 2013-03-20 ENCOUNTER — Ambulatory Visit (HOSPITAL_COMMUNITY): Payer: Medicaid Other | Attending: Internal Medicine

## 2013-03-23 NOTE — Progress Notes (Signed)
Case discussed with Dr. Ziemer immediately after the resident saw the patient.  We reviewed the resident's history and exam and pertinent patient test results.  I agree with the assessment, diagnosis and plan of care documented in the resident's note. 

## 2013-04-03 ENCOUNTER — Encounter: Payer: Self-pay | Admitting: Internal Medicine

## 2013-04-03 ENCOUNTER — Ambulatory Visit (INDEPENDENT_AMBULATORY_CARE_PROVIDER_SITE_OTHER): Payer: Medicaid Other | Admitting: Internal Medicine

## 2013-04-03 ENCOUNTER — Encounter: Payer: Self-pay | Admitting: *Deleted

## 2013-04-03 VITALS — BP 122/75 | HR 75 | Temp 97.3°F | Wt 177.0 lb

## 2013-04-03 DIAGNOSIS — E1159 Type 2 diabetes mellitus with other circulatory complications: Secondary | ICD-10-CM

## 2013-04-03 DIAGNOSIS — H35039 Hypertensive retinopathy, unspecified eye: Secondary | ICD-10-CM

## 2013-04-03 DIAGNOSIS — F17210 Nicotine dependence, cigarettes, uncomplicated: Secondary | ICD-10-CM | POA: Insufficient documentation

## 2013-04-03 DIAGNOSIS — I10 Essential (primary) hypertension: Secondary | ICD-10-CM

## 2013-04-03 DIAGNOSIS — F17200 Nicotine dependence, unspecified, uncomplicated: Secondary | ICD-10-CM

## 2013-04-03 DIAGNOSIS — Z0000 Encounter for general adult medical examination without abnormal findings: Secondary | ICD-10-CM

## 2013-04-03 LAB — GLUCOSE, CAPILLARY: Glucose-Capillary: 197 mg/dL — ABNORMAL HIGH (ref 70–99)

## 2013-04-03 MED ORDER — GLIMEPIRIDE 4 MG PO TABS: 4.0000 mg | ORAL_TABLET | Freq: Every day | ORAL | Status: DC

## 2013-04-03 MED ORDER — METFORMIN HCL ER (MOD) 1000 MG PO TB24: 2000.0000 mg | ORAL_TABLET | Freq: Every day | ORAL | Status: DC

## 2013-04-03 NOTE — Telephone Encounter (Signed)
A user error has taken place: encounter opened in error, closed for administrative reasons.Bridget Spittle Cassady6/27/20143:02 PM  .

## 2013-04-03 NOTE — Assessment & Plan Note (Signed)
She is in the pre-contemplative stage at this point and wants to wait until after the summer to discuss smoking cessation.

## 2013-04-03 NOTE — Assessment & Plan Note (Signed)
BP Readings from Last 3 Encounters:  04/03/13 122/75  03/09/13 152/92  02/03/13 142/76    Lab Results  Component Value Date   NA 142 11/26/2012   K 3.9 11/26/2012   CREATININE 0.58 02/19/2013    Assessment: Blood pressure control: controlled Progress toward BP goal:  at goal Comments: atenolol-chlorthalidone 50-25mg  daily and losartan 100mg  daily  Plan: Medications:  continue current medications Educational resources provided:   Self management tools provided:   Other plans:

## 2013-04-03 NOTE — Assessment & Plan Note (Signed)
Has missed her last couple of appointments. Says she is rescheduled for July 29 with Dr. Elmer Picker. Implored her to attend appointment.

## 2013-04-03 NOTE — Assessment & Plan Note (Signed)
[facility] appt missed, rescheduled for 04/14/13.

## 2013-04-03 NOTE — Patient Instructions (Signed)
1. I'm sending in new metformin - 2 pills once a day. I'm sending in a new glimepiride - 1 pill a day. Until you get the new prescriptions, take 2 pills of what you have of glimepiride.    Treatment Goals:  Goals (1 Years of Data) as of 04/03/13         As of Today 03/09/13 02/03/13 12/10/12 11/26/12     Blood Pressure    . Blood Pressure < 140/90  122/75 152/92 142/76 157/99 156/85     Result Component    . HEMOGLOBIN A1C < 7.0   8.6       . LDL CALC < 100     99       Progress Toward Treatment Goals:  Treatment Goal 04/03/2013  Hemoglobin A1C unchanged  Blood pressure at goal  Stop smoking smoking the same amount    Self Care Goals & Plans:  Self Care Goal 04/03/2013  Manage my medications take my medicines as prescribed; refill my medications on time  Monitor my health keep track of my blood glucose  Eat healthy foods eat baked foods instead of fried foods; eat foods that are low in salt  Be physically active park at the far end of the parking lot; find an activity I enjoy  Stop smoking go to the [facility] website (PumpkinSearch.com.ee); cut down the number of cigarettes smoked    Home Blood Glucose Monitoring 03/09/2013  Check my blood sugar no home glucose monitoring  When to check my blood sugar N/A     Care Management & Community Referrals:  Referral 03/09/2013  Referrals made for care management support [facility] case management program; social worker

## 2013-04-03 NOTE — Assessment & Plan Note (Signed)
Lab Results  Component Value Date   HGBA1C 8.6 03/09/2013   HGBA1C 9.0* 11/25/2012   HGBA1C 8.6 09/19/2012     Assessment: Diabetes control: fair control Progress toward A1C goal:  unchanged Comments: Taking her regular metformin all once instead of twice a day.  Plan: Medications:  Have changed her metformin to extended release formulation to take 2 1000 mg tablets each morning. Have increased strength of the glyburide to 4 mg each morning. Will up titrate glyburide at next visit if no hypoglycemia. She was implored to bring her glucose meter to next visit. Home glucose monitoring: Frequency:   Timing:   Instruction/counseling given: reminded to get eye exam and reminded to bring blood glucose meter & log to each visit Educational resources provided:   Self management tools provided:   Other plans: Declines to meet with diabetes educator

## 2013-04-03 NOTE — Progress Notes (Signed)
Patient ID: Bridget Owens, female   DOB: May 16, 1956, 57 y.o.   MRN: [id number]  Subjective:   Patient ID: Bridget Owens female   DOB: 05-Apr-1956 57 y.o.   MRN: [id number]  HPI: Bridget Owens is a 57 y.o. female with history of diabetes, hypertension, GERD, and chronic right-sided Bell's palsy who presents for followup.   In regards to her hypertension, she reports daily compliance with atenolol-chlorthalidone 50-25mg  daily and losartan 100mg  daily. Denies headaches, chest pain, shortness of breath, palpitations. She has HTN retinopathy and missed her followup appointment with ophthalmologist Dr. Elmer Picker. Says that the appointment is rescheduled for 7/29.   In terms of her diabetes, her last HbA1c was 8.6 and glimepiride 2mg  was added to her metformin. For a long time she did not fill the glyburide, but she reports that she has been taking one tablet a day along with her metformin. She does say that she takes two 1000 mg metformin tablets at once instead of twice a day.  She says her blood sugars at home are around 111-150. Blood sugar in the clinic is 197. She did not bring her meter.  She missed her mammography appointment and says that it is rescheduled for July 8. Up-to-date on Pap smear and colonoscopy.   Past Medical History  Diagnosis Date  . Bell's palsy 01/2009    Left sided  . Type II diabetes mellitus   . Hypertension   . Hyperlipidemia   . Allergic rhinitis   . Intolerance, drug     Leg cramps on Lipitor  . GERD (gastroesophageal reflux disease)   . Uterine fibroid   . Chest pain     Exercise stress test negative 6/07  . Postmenopausal symptoms     Hot flashes, vaginal dryness, iritability, difficulty sleeping. as of 2005.  Marland Kitchen Insomnia   . External hemorrhoid   . Lipoma 12/11    Posterior neck.   . Osteoarthritis     Carpometacarpal joint of right thumb  . Trigger finger     Right thumb  . Sebaceous cyst of breast     Has been refered to derm.  . Adenomatous colon  polyp 6/09    Resected on colonoscopy, no high grade dysplasia.   . Hypertensive retinopathy     Followed by Dr. Elmer Picker.  . Diabetic peripheral neuropathy   . Postmenopausal bleeding 9/05    Endometrial biopsy showed  FOCAL TUBAL METAPLASIA   . Colon polyps     03/22/2008: adenomatous polyps x3 w no dysplasia. Needs repeat colonoscopy in 3 years   Current Outpatient Prescriptions  Medication Sig Dispense Refill  . aspirin 81 MG EC tablet Take 81 mg by mouth daily.        Marland Kitchen atenolol-chlorthalidone (TENORETIC) 50-25 MG per tablet Take 1 tablet by mouth daily.  30 tablet  5  . atorvastatin (LIPITOR) 40 MG tablet Take 1 tablet (40 mg total) by mouth daily.  30 tablet  11  . benzonatate (TESSALON PERLES) 100 MG capsule Take 1 capsule (100 mg total) by mouth every 6 (six) hours as needed for cough.  30 capsule  0  . Blood Glucose Monitoring Suppl (ACCU-CHEK NANO SMARTVIEW) W/DEVICE KIT 1 each by Does not apply route 2 (two) times daily. Dx code 250.00  1 kit  0  . diphenhydrAMINE (BENADRYL) 25 MG tablet Take 25 mg by mouth every 6 (six) hours as needed for allergies.      . fluconazole (DIFLUCAN) 150 MG tablet Take  1 tablet (150 mg total) by mouth once.  2 tablet  0  . gabapentin (NEURONTIN) 300 MG capsule Take 1 capsule (300 mg total) by mouth 2 (two) times daily as needed.  60 capsule  0  . glimepiride (AMARYL) 4 MG tablet Take 1 tablet (4 mg total) by mouth daily before breakfast.  30 tablet  5  . ibuprofen (ADVIL,MOTRIN) 800 MG tablet Take 1 tablet (800 mg total) by mouth every 8 (eight) hours as needed for pain.  30 tablet  0  . losartan (COZAAR) 100 MG tablet Take 1 tablet (100 mg total) by mouth daily.  30 tablet  5  . metFORMIN (GLUMETZA) 1000 MG (MOD) 24 hr tablet Take 2 tablets (2,000 mg total) by mouth daily with breakfast.  60 tablet  5  . [DISCONTINUED] pantoprazole (PROTONIX) 20 MG tablet Take 1 tablet (20 mg total) by mouth daily.  30 tablet  0   No current facility-administered  medications for this visit.   Family History  Problem Relation Age of Onset  . Pneumonia Mother   . Diabetes Mother   . Early death Father   . Diabetes Sister   . Diabetes Brother   . Diabetes Maternal Grandmother    History   Social History  . Marital Status: Single    Spouse Name: N/A    Number of Children: N/A  . Years of Education: N/A   Social History Main Topics  . Smoking status: Current Every Day Smoker -- 0.75 packs/day for 30 years    Types: Cigarettes  . Smokeless tobacco: Former Neurosurgeon    Quit date: 11/28/2010     Comment: trying to quit .  Doing a little less now.  . Alcohol Use: No  . Drug Use: No  . Sexually Active: Not on file   Other Topics Concern  . Not on file   Social History Narrative  . No narrative on file   Review of Systems: 10 pt ROS performed, pertinent positives and negatives noted in HPI Objective:  Physical Exam: Filed Vitals:   04/03/13 1447  BP: 122/75  Pulse: 75  Temp: 97.3 F (36.3 C)  TempSrc: Oral  Weight: 177 lb (80.287 kg)  SpO2: 97%   Constitutional: Vital signs reviewed. Patient is a well-developed and well-nourished female in no acute distress and cooperative with exam. Alert and oriented x3.  Head: Normocephalic and atraumatic  Neck: Supple, Trachea midline normal ROM; no JVD, mass, thyromegaly.  Cardiovascular: RRR, S1 normal, S2 normal, no MRG, pulses symmetric and intact bilaterally  Pulmonary/Chest: CTAB, no wheezes, rales, or rhonchi  Abdominal: Soft. NTND. Normoactive BS Musculoskeletal: No joint deformities, erythema, or stiffness, ROM full and no nontender Hematology: no cervical adenopathy or visible bruising  Ext: warm and well perfused, no edema  Assessment & Plan:   Please see problem-based charting for assessment and plan.

## 2013-04-04 NOTE — Progress Notes (Signed)
Case discussed with Dr. Ziemer soon after the resident saw the patient. We reviewed the resident's history and exam and pertinent patient test results. I agree with the assessment, diagnosis, and plan of care documented in the resident's note. 

## 2013-04-14 ENCOUNTER — Ambulatory Visit (HOSPITAL_COMMUNITY): Payer: Medicaid Other | Attending: Internal Medicine

## 2013-04-16 ENCOUNTER — Other Ambulatory Visit: Payer: Self-pay | Admitting: *Deleted

## 2013-04-16 DIAGNOSIS — Z86010 Personal history of colonic polyps: Secondary | ICD-10-CM

## 2013-04-16 DIAGNOSIS — E1159 Type 2 diabetes mellitus with other circulatory complications: Secondary | ICD-10-CM

## 2013-04-16 DIAGNOSIS — E785 Hyperlipidemia, unspecified: Secondary | ICD-10-CM

## 2013-04-16 DIAGNOSIS — E119 Type 2 diabetes mellitus without complications: Secondary | ICD-10-CM

## 2013-04-16 DIAGNOSIS — I10 Essential (primary) hypertension: Secondary | ICD-10-CM

## 2013-04-16 DIAGNOSIS — D1779 Benign lipomatous neoplasm of other sites: Secondary | ICD-10-CM

## 2013-04-16 DIAGNOSIS — Z1231 Encounter for screening mammogram for malignant neoplasm of breast: Secondary | ICD-10-CM

## 2013-04-16 DIAGNOSIS — G4700 Insomnia, unspecified: Secondary | ICD-10-CM

## 2013-04-16 DIAGNOSIS — Z0000 Encounter for general adult medical examination without abnormal findings: Secondary | ICD-10-CM

## 2013-04-16 NOTE — Telephone Encounter (Signed)
Pt states she has pain in her shoulders and legs, states ibuprofen works well

## 2013-04-16 NOTE — Telephone Encounter (Signed)
This is a high dose of ibuprofen.  Please find out what type of pain patient is using it for, and how often she is taking it.

## 2013-04-17 MED ORDER — IBUPROFEN 800 MG PO TABS: 800.0000 mg | ORAL_TABLET | Freq: Three times a day (TID) | ORAL | Status: DC | PRN

## 2013-04-17 NOTE — Telephone Encounter (Deleted)
The patient just filled this prescription.  5 weeks ago (03/09/2013) ibuprofen (ADVIL,MOTRIN) 800 MG tablet Take 1 tablet (800 mg total) by mouth every 8 (eight) hours as needed for pain. Dispense: 30 tablet Refills: 0 Start: 03/09/2013

## 2013-04-28 ENCOUNTER — Encounter: Payer: Self-pay | Admitting: Licensed Clinical Social Worker

## 2013-04-28 NOTE — Progress Notes (Signed)
Ms. Vazguez placed call to CSW.  CSW returned call, pt requesting a letter to provide to [facility] requesting furniture referral.  CSW and PCP provided a letter to Ms. Chamblee earlier in the year.  CSW will provide pt with update letter, as pt has yet to have an appt with newly assigned PCP.

## 2013-05-18 ENCOUNTER — Ambulatory Visit (INDEPENDENT_AMBULATORY_CARE_PROVIDER_SITE_OTHER): Payer: Medicaid Other | Admitting: Internal Medicine

## 2013-05-18 ENCOUNTER — Telehealth: Payer: Self-pay | Admitting: *Deleted

## 2013-05-18 VITALS — BP 123/76 | HR 78 | Temp 97.3°F | Ht 67.0 in | Wt 177.8 lb

## 2013-05-18 DIAGNOSIS — E1159 Type 2 diabetes mellitus with other circulatory complications: Secondary | ICD-10-CM

## 2013-05-18 DIAGNOSIS — Z0000 Encounter for general adult medical examination without abnormal findings: Secondary | ICD-10-CM

## 2013-05-18 DIAGNOSIS — E119 Type 2 diabetes mellitus without complications: Secondary | ICD-10-CM

## 2013-05-18 DIAGNOSIS — D1779 Benign lipomatous neoplasm of other sites: Secondary | ICD-10-CM

## 2013-05-18 DIAGNOSIS — B070 Plantar wart: Secondary | ICD-10-CM

## 2013-05-18 DIAGNOSIS — E785 Hyperlipidemia, unspecified: Secondary | ICD-10-CM

## 2013-05-18 DIAGNOSIS — I10 Essential (primary) hypertension: Secondary | ICD-10-CM

## 2013-05-18 DIAGNOSIS — Z86010 Personal history of colonic polyps: Secondary | ICD-10-CM

## 2013-05-18 DIAGNOSIS — G4700 Insomnia, unspecified: Secondary | ICD-10-CM

## 2013-05-18 DIAGNOSIS — Z1231 Encounter for screening mammogram for malignant neoplasm of breast: Secondary | ICD-10-CM

## 2013-05-18 LAB — GLUCOSE, CAPILLARY: Glucose-Capillary: 126 mg/dL — ABNORMAL HIGH (ref 70–99)

## 2013-05-18 MED ORDER — IBUPROFEN 800 MG PO TABS: 800.0000 mg | ORAL_TABLET | Freq: Three times a day (TID) | ORAL | Status: DC | PRN

## 2013-05-18 NOTE — Telephone Encounter (Signed)
Pt called with c/o pain to right foot. Onset of pain for several months. She has a wart on bottom of foot.  She has seen Dr Ralene Cork at [facility] in 2013.  Will need new referral.  Will see today

## 2013-05-18 NOTE — Progress Notes (Signed)
This is a Psychologist, occupational Note.  The care of the patient was discussed with Dr. Criselda Peaches and the assessment and plan was formulated with their assistance.  Please see their note for official documentation of the patient encounter.   Subjective:   Patient ID: Bridget Owens female   DOB: 18-Mar-1956 57 y.o.   MRN: [id number]  CC: "I need to be referred to have the wart on my left foot removed."  HPI: [name] is a 57 y.o. who presents to clinic for evaluation of chronic warts on the plantar surface of her left foot. She reports that the area has been present since April of 2014, but became more painful 8/10 over the last two weeks. She describes the pain as waxing and waning that is exacerbated by walking on the affected area. She reports that ibuprofen 800mg  has reduced pain to 4/10 in the past but lotions, warm baths and ice do not improve symptoms. She denies discharge from the area. She experienced a similar lesion on the plantar surface of the right foot 1 year ago that was diagnosed as a plantar wart and it was subsequently excised with resolution of symptoms following. She would like to be referred back to [facility] for evaluation of treatment of the area on her left foot.     Past Medical History  Diagnosis Date   Bell's palsy 01/2009    Left sided   Type II diabetes mellitus    Hypertension    Hyperlipidemia    Allergic rhinitis    Intolerance, drug     Leg cramps on Lipitor   GERD (gastroesophageal reflux disease)    Uterine fibroid    Chest pain     Exercise stress test negative 6/07   Postmenopausal symptoms     Hot flashes, vaginal dryness, iritability, difficulty sleeping. as of 2005.   Insomnia    External hemorrhoid    Lipoma 12/11    Posterior neck.    Osteoarthritis     Carpometacarpal joint of right thumb   Trigger finger     Right thumb   Sebaceous cyst of breast     Has been refered to derm.   Adenomatous colon polyp 6/09   Resected on colonoscopy, no high grade dysplasia.    Hypertensive retinopathy     Followed by Dr. Elmer Picker.   Diabetic peripheral neuropathy    Postmenopausal bleeding 9/05    Endometrial biopsy showed  FOCAL TUBAL METAPLASIA    Colon polyps     03/22/2008: adenomatous polyps x3 w no dysplasia. Needs repeat colonoscopy in 3 years   Current Outpatient Prescriptions  Medication Sig Dispense Refill   aspirin 81 MG EC tablet Take 81 mg by mouth daily.         atenolol-chlorthalidone (TENORETIC) 50-25 MG per tablet Take 1 tablet by mouth daily.  30 tablet  5   atorvastatin (LIPITOR) 40 MG tablet Take 1 tablet (40 mg total) by mouth daily.  30 tablet  11   benzonatate (TESSALON PERLES) 100 MG capsule Take 1 capsule (100 mg total) by mouth every 6 (six) hours as needed for cough.  30 capsule  0   Blood Glucose Monitoring Suppl (ACCU-CHEK NANO SMARTVIEW) W/DEVICE KIT 1 each by Does not apply route 2 (two) times daily. Dx code 250.00  1 kit  0   diphenhydrAMINE (BENADRYL) 25 MG tablet Take 25 mg by mouth every 6 (six) hours as needed for allergies.  fluconazole (DIFLUCAN) 150 MG tablet Take 1 tablet (150 mg total) by mouth once.  2 tablet  0   gabapentin (NEURONTIN) 300 MG capsule Take 1 capsule (300 mg total) by mouth 2 (two) times daily as needed.  60 capsule  0   glimepiride (AMARYL) 4 MG tablet Take 1 tablet (4 mg total) by mouth daily before breakfast.  30 tablet  5   ibuprofen (ADVIL,MOTRIN) 800 MG tablet Take 1 tablet (800 mg total) by mouth every 8 (eight) hours as needed for pain.  30 tablet  0   losartan (COZAAR) 100 MG tablet Take 1 tablet (100 mg total) by mouth daily.  30 tablet  5   metFORMIN (GLUMETZA) 1000 MG (MOD) 24 hr tablet Take 2 tablets (2,000 mg total) by mouth daily with breakfast.  60 tablet  5   [DISCONTINUED] pantoprazole (PROTONIX) 20 MG tablet Take 1 tablet (20 mg total) by mouth daily.  30 tablet  0   No current facility-administered medications for  this visit.   Family History  Problem Relation Age of Onset   Pneumonia Mother    Diabetes Mother    Early death Father    Diabetes Sister    Diabetes Brother    Diabetes Maternal Grandmother    History   Social History   Marital Status: Single    Spouse Name: N/A    Number of Children: N/A   Years of Education: N/A   Social History Main Topics   Smoking status: Current Every Day Smoker -- 0.75 packs/day for 30 years    Types: Cigarettes   Smokeless tobacco: Former Neurosurgeon    Quit date: 11/28/2010     Comment: trying to quit .  Doing a little less now.   Alcohol Use: No   Drug Use: No   Sexually Active: Not on file   Other Topics Concern   Not on file   Social History Narrative   No narrative on file   Review of Systems: 12 point review of systems was compelted and negative aside from per HPI.   Objective:  Physical Exam: Filed Vitals:   05/18/13 1343  BP: 123/76  Pulse: 78  Temp: 97.3 F (36.3 C)  TempSrc: Oral  Height: 5\' 7"  (1.702 m)  Weight: 177 lb 12.8 oz (80.65 kg)  SpO2: 97%   General appearance: alert, cooperative and no distress Head: Normocephalic, without obvious abnormality, atraumatic Eyes: conjunctivae/corneas clear. PERRL, EOM's intact. Fundi benign. Ears: Normal External ears Nose: Nares normal. Septum midline. Mucosa normal. No drainage or sinus tenderness. Throat: lips, mucosa, and tongue normal; teeth and gums normal Lungs: clear to auscultation bilaterally Heart: regular rate and rhythm Abdomen: soft, non-tender; bowel sounds normal; no masses,  no organomegaly Extremities: Plantar wart present on the lateral aspect of the left plantar surface. The area is flatt and appears to be callused peripherally. Mild erythema is appreciated peripheral to the callus.   Neurologic: Alert and oriented X 3, normal strength and tone. Normal symmetric reflexes. Normal coordination and gait  Assessment & Plan:  1. Referral to [facility] for evaluation and treatment of plantar wart on left foot.  2. Patient to RTC in 1 month for routine health maintenance appointment, specifically glycemic control.

## 2013-05-18 NOTE — Progress Notes (Signed)
  Subjective:    Patient ID: Bridget Owens, female    DOB: 02/25/1956, 57 y.o.   MRN: [id number]  CC: Foot pain  HPI  Ms. Buhman is a 57yo woman with PMH of HLD, DM2, HTN who presents today for pain in her foot.  She has noticed an enlarging area on her foot for the past couple of months, but recently it has become more painful over the last 2 weeks to the point where it hurts to walk on the foot or touch the area.  She has been taking 800mg  strength ibuprofen which reduces the pain to a 4/10, she has also tried warm baths, ice and lotion without improvement.  There is no discharge.    She has had plantar warts before on the other foot and was treated successfully at [facility].  She would like to be referred to them today.   Review of Systems  Constitutional: Negative for fever and chills.  Musculoskeletal: Positive for gait problem (due to pain). Negative for back pain and arthralgias.  Skin: Positive for color change (mild erythema at site). Negative for rash and wound.       Objective:   Physical Exam  Constitutional: She is oriented to person, place, and time. She appears well-developed and well-nourished.  HENT:  Head: Normocephalic and atraumatic.  Cardiovascular: Normal rate and regular rhythm.   Pulmonary/Chest: Effort normal and breath sounds normal. No respiratory distress.  Abdominal: Soft. Bowel sounds are normal.  Musculoskeletal: She exhibits no edema.  ttp over plantar surface of right foot at 3rd-4th metatarsal, callus present and small area of induration  Neurological: She is alert and oriented to person, place, and time.  Skin: Skin is warm and dry. No abrasion, no bruising, no burn, no laceration and no rash noted. There is erythema (mild at site of wart).             Assessment & Plan:  RTC in 1 month for routine follow up DM with PCP Dr. Yetta Barre

## 2013-05-18 NOTE — Patient Instructions (Signed)
It was a pleasure to meet you today! 1. We have made a referral to [facility] for you to be evaluated. Please follow up with them 2. We have refilled your prescription for Ibuprofen. Please take as directed. 3. Make an appointment for 1 month from today's appointment to be seen by your Primary care physician for continued care.    Please contact our office with any questions or concerns.

## 2013-05-19 NOTE — Assessment & Plan Note (Signed)
We will refer Bridget Owens to the [facility] for treatment of her plantar wart.  She has had success with this group before for a similar issue related to her other foot.    No further therapy today, have refilled her ibuprofen.  On review, her skin findings are similar to a classic plantar wart.

## 2013-06-19 ENCOUNTER — Encounter: Payer: Medicaid Other | Admitting: Internal Medicine

## 2013-06-24 ENCOUNTER — Other Ambulatory Visit: Payer: Self-pay | Admitting: *Deleted

## 2013-06-24 DIAGNOSIS — E785 Hyperlipidemia, unspecified: Secondary | ICD-10-CM

## 2013-06-24 DIAGNOSIS — D1779 Benign lipomatous neoplasm of other sites: Secondary | ICD-10-CM

## 2013-06-24 DIAGNOSIS — Z86010 Personal history of colonic polyps: Secondary | ICD-10-CM

## 2013-06-24 DIAGNOSIS — I10 Essential (primary) hypertension: Secondary | ICD-10-CM

## 2013-06-24 DIAGNOSIS — G4700 Insomnia, unspecified: Secondary | ICD-10-CM

## 2013-06-24 DIAGNOSIS — E1159 Type 2 diabetes mellitus with other circulatory complications: Secondary | ICD-10-CM

## 2013-06-24 DIAGNOSIS — Z1231 Encounter for screening mammogram for malignant neoplasm of breast: Secondary | ICD-10-CM

## 2013-06-24 DIAGNOSIS — Z0000 Encounter for general adult medical examination without abnormal findings: Secondary | ICD-10-CM

## 2013-06-24 DIAGNOSIS — E119 Type 2 diabetes mellitus without complications: Secondary | ICD-10-CM

## 2013-06-25 MED ORDER — IBUPROFEN 800 MG PO TABS: 800.0000 mg | ORAL_TABLET | Freq: Three times a day (TID) | ORAL | Status: DC | PRN

## 2013-07-03 ENCOUNTER — Encounter: Payer: Self-pay | Admitting: Internal Medicine

## 2013-07-03 ENCOUNTER — Ambulatory Visit (INDEPENDENT_AMBULATORY_CARE_PROVIDER_SITE_OTHER): Payer: Medicaid Other | Admitting: Internal Medicine

## 2013-07-03 VITALS — BP 111/70 | HR 77 | Temp 96.3°F | Ht 67.0 in | Wt 181.7 lb

## 2013-07-03 DIAGNOSIS — J309 Allergic rhinitis, unspecified: Secondary | ICD-10-CM

## 2013-07-03 DIAGNOSIS — I10 Essential (primary) hypertension: Secondary | ICD-10-CM

## 2013-07-03 DIAGNOSIS — Z0000 Encounter for general adult medical examination without abnormal findings: Secondary | ICD-10-CM

## 2013-07-03 DIAGNOSIS — E1159 Type 2 diabetes mellitus with other circulatory complications: Secondary | ICD-10-CM

## 2013-07-03 LAB — POCT GLYCOSYLATED HEMOGLOBIN (HGB A1C): Hemoglobin A1C: 5.8

## 2013-07-03 LAB — GLUCOSE, CAPILLARY: Glucose-Capillary: 180 mg/dL — ABNORMAL HIGH (ref 70–99)

## 2013-07-03 MED ORDER — METFORMIN HCL ER (MOD) 1000 MG PO TB24: 1000.0000 mg | ORAL_TABLET | Freq: Every day | ORAL | Status: DC

## 2013-07-03 MED ORDER — CETIRIZINE HCL 10 MG PO TABS: 10.0000 mg | ORAL_TABLET | Freq: Every day | ORAL | Status: DC

## 2013-07-03 NOTE — Patient Instructions (Addendum)
General Instructions:  1. Please schedule a follow up appointment for 6-8 weeks.  Please check your blood sugar before you return so we can see how your blood sugar has been.  2. Please take all medications as prescribed. Pleas STOP taking the Glimepiride (Amaryl). Take ONLY 1000 mg Metformin in the AM.  3. If you have worsening of your symptoms or new symptoms arise, please call the clinic ([phone]), or go to the ER immediately if symptoms are severe.  You have done a great job in taking all your medications. I appreciate it very much. Please continue doing that.   Treatment Goals:  Goals (1 Years of Data) as of 07/03/13         As of Today 05/18/13 04/03/13 03/09/13 02/03/13     Blood Pressure    . Blood Pressure < 140/90  111/70 123/76 122/75 152/92 142/76     Result Component    . HEMOGLOBIN A1C < 7.0  5.8   8.6     . LDL CALC < 100            Progress Toward Treatment Goals:  Treatment Goal 07/03/2013  Hemoglobin A1C improved  Blood pressure improved  Stop smoking smoking less    Self Care Goals & Plans:  Self Care Goal 07/03/2013  Manage my medications take my medicines as prescribed; bring my medications to every visit  Monitor my health keep track of my blood glucose; bring my glucose meter and log to each visit  Eat healthy foods -  Be physically active -  Stop smoking -  Meeting treatment goals maintain the current self-care plan    Home Blood Glucose Monitoring 07/03/2013  Check my blood sugar once a day  When to check my blood sugar before breakfast     Care Management & Community Referrals:  Referral 07/03/2013  Referrals made for care management support none needed  Referrals made to community resources -

## 2013-07-03 NOTE — Progress Notes (Signed)
I saw and evaluated the patient.  I personally confirmed the key portions of the history and exam documented by Dr. Deleon and I reviewed pertinent patient test results.  The assessment, diagnosis, and plan were formulated together and I agree with the documentation in the resident's note.   

## 2013-07-04 ENCOUNTER — Encounter: Payer: Self-pay | Admitting: Internal Medicine

## 2013-07-04 LAB — MICROALBUMIN / CREATININE URINE RATIO
Creatinine, Urine: 42.3 mg/dL
Microalb Creat Ratio: 11.8 mg/g (ref 0.0–30.0)
Microalb, Ur: 0.5 mg/dL (ref 0.00–1.89)

## 2013-07-04 NOTE — Assessment & Plan Note (Addendum)
Patient w/ repeat HbA1c of 5.8 today, down from 8.6 on 03/09/13. She was taking Metformin 1000 mg bid + Glimepiride 4 mg in AM. She was having low blood sugars last month (one in the 50's), accompanied by dizziness, lightheadedness, and fatigue, and decided to stop taking her evening Metformin dose. She has not had symptoms of hypoglycemia since that time. Blood sugar 180 today. -Given her ne low A1c and reported hypoglycemia, discontinued Glimepiride 4 mg in AM. -Patient will continue with Metformin 1000 mg QD at this time.  -Urine Microalbumin/Cr ratio 11.8 today. -She will return in 6-8 weeks for follow up and was instructed to check her sugars and bring her meter with her to the next appointment.

## 2013-07-04 NOTE — Assessment & Plan Note (Signed)
Complaining of seasonal allergies, only slightly relieved my nightly Benadryl -Given Zyrtec 10 mg qd. Did not want to try Flonase

## 2013-07-04 NOTE — Progress Notes (Signed)
Patient ID: Elta Guadeloupe, female   DOB: 1956-09-02, 57 y.o.   MRN: [id number]   Subjective:   Patient ID: MIRACLE MONGILLO female   DOB: 17-Dec-1955 57 y.o.   MRN: [id number]  HPI: Ms.Bettylou L Trevino is a 57 y.o. F w/ PMHx of DM type II, HTN, HLD, GERD, OA, and other co-morbidities, presents to clinic today for follow up visit. Patient says she has been feeling very well lately and denies any significant health issues at this time.   During this visit, we discussed her DM management at length, as her HbA1c is 5.8 today, improved from 8.6 on 03/09/13. The patient has been taking Metformin 1000 mg bid and Glimepiride 4 mg in the AM, however, with further discussion, the patient has been having symptoms of hypoglycemia in the mornings, including dizziness, lightheadedness, and fatigue. She claims that last month, she had one blood sugar in the 50's and had some other low values, so she decided to stop taking her Metformin at night. For the past month, she has only been taking the Glimepiride 4 mg in AM + Metformin 1000 mg in AM. She is not very consistent with checking her blood sugars, but has not felt symptoms of hypoglycemia since she stopped taking her evening Metformin dose.  The patient also claims she has been having some seasonal allergies for which she takes Benadryl at night, which has only slightly been helping.  Otherwise, the patient denies any further issues. She denies any palpitations, chest pain, SOB, LOC, fever, chills, diaphoresis, nausea, vomiting, abdominal pain, diarrhea, or constipation.   Past Medical History  Diagnosis Date  . Bell's palsy 01/2009    Left sided  . Type II diabetes mellitus   . Hypertension   . Hyperlipidemia   . Allergic rhinitis   . Intolerance, drug     Leg cramps on Lipitor  . GERD (gastroesophageal reflux disease)   . Uterine fibroid   . Chest pain     Exercise stress test negative 6/07  . Postmenopausal symptoms     Hot flashes, vaginal dryness,  iritability, difficulty sleeping. as of 2005.  Marland Kitchen Insomnia   . External hemorrhoid   . Lipoma 12/11    Posterior neck.   . Osteoarthritis     Carpometacarpal joint of right thumb  . Trigger finger     Right thumb  . Sebaceous cyst of breast     Has been refered to derm.  . Adenomatous colon polyp 6/09    Resected on colonoscopy, no high grade dysplasia.   . Hypertensive retinopathy     Followed by Dr. Elmer Picker.  . Diabetic peripheral neuropathy   . Postmenopausal bleeding 9/05    Endometrial biopsy showed  FOCAL TUBAL METAPLASIA   . Colon polyps     03/22/2008: adenomatous polyps x3 w no dysplasia. Needs repeat colonoscopy in 3 years   Current Outpatient Prescriptions  Medication Sig Dispense Refill  . aspirin 81 MG EC tablet Take 81 mg by mouth daily.        Marland Kitchen atenolol-chlorthalidone (TENORETIC) 50-25 MG per tablet Take 1 tablet by mouth daily.  30 tablet  5  . atorvastatin (LIPITOR) 40 MG tablet Take 1 tablet (40 mg total) by mouth daily.  30 tablet  11  . benzonatate (TESSALON PERLES) 100 MG capsule Take 1 capsule (100 mg total) by mouth every 6 (six) hours as needed for cough.  30 capsule  0  . Blood Glucose Monitoring Suppl (ACCU-CHEK NANO SMARTVIEW)  W/DEVICE KIT 1 each by Does not apply route 2 (two) times daily. Dx code 250.00  1 kit  0  . cetirizine (ZYRTEC ALLERGY) 10 MG tablet Take 1 tablet (10 mg total) by mouth daily.  30 tablet  3  . diphenhydrAMINE (BENADRYL) 25 MG tablet Take 25 mg by mouth every 6 (six) hours as needed for allergies.      . fluconazole (DIFLUCAN) 150 MG tablet Take 1 tablet (150 mg total) by mouth once.  2 tablet  0  . gabapentin (NEURONTIN) 300 MG capsule Take 1 capsule (300 mg total) by mouth 2 (two) times daily as needed.  60 capsule  0  . ibuprofen (ADVIL,MOTRIN) 800 MG tablet Take 1 tablet (800 mg total) by mouth every 8 (eight) hours as needed for pain.  30 tablet  0  . losartan (COZAAR) 100 MG tablet Take 1 tablet (100 mg total) by mouth daily.   30 tablet  5  . metFORMIN (GLUMETZA) 1000 MG (MOD) 24 hr tablet Take 1 tablet (1,000 mg total) by mouth daily with breakfast.  60 tablet  5  . [DISCONTINUED] pantoprazole (PROTONIX) 20 MG tablet Take 1 tablet (20 mg total) by mouth daily.  30 tablet  0   No current facility-administered medications for this visit.   Family History  Problem Relation Age of Onset  . Pneumonia Mother   . Diabetes Mother   . Early death Father   . Diabetes Sister   . Diabetes Brother   . Diabetes Maternal Grandmother    History   Social History  . Marital Status: Single    Spouse Name: N/A    Number of Children: N/A  . Years of Education: N/A   Social History Main Topics  . Smoking status: Current Every Day Smoker -- 0.30 packs/day for 30 years    Types: Cigarettes  . Smokeless tobacco: Former Neurosurgeon    Quit date: 11/28/2010     Comment: trying to quit .  Doing a little less now.  . Alcohol Use: No  . Drug Use: No  . Sexual Activity: None   Other Topics Concern  . None   Social History Narrative  . None   Review of Systems: General: Denies fever, chills, diaphoresis, appetite change and fatigue.  Respiratory: Denies SOB, DOE, cough, chest tightness, and wheezing.   Cardiovascular: Denies chest pain, palpitations and leg swelling.  Gastrointestinal: Denies nausea, vomiting, abdominal pain, diarrhea, constipation, blood in stool and abdominal distention.  Genitourinary: Denies dysuria, urgency, frequency, hematuria, flank pain and difficulty urinating.  Endocrine: Positive for chronic hot flashes. Denies hot or cold intolerance, sweats, polyuria, polydipsia. Musculoskeletal: Denies myalgias, back pain, joint swelling, arthralgias and gait problem.  Skin: Denies pallor, rash and wounds.  Neurological: Positive for dizziness/lighheadedness associated w/ low blood sugars. Denies seizures, syncope, weakness, numbness and headaches.  Psychiatric/Behavioral: Denies mood changes, confusion,  nervousness, sleep disturbance and agitation.  Objective:  Physical Exam: Filed Vitals:   07/03/13 1526  BP: 111/70  Pulse: 77  Temp: 96.3 F (35.7 C)  TempSrc: Oral  Height: 5\' 7"  (1.702 m)  Weight: 181 lb 11.2 oz (82.419 kg)  SpO2: 98%   General: Vital signs reviewed.  Patient is a well-developed and well-nourished, in no acute distress and cooperative with exam. Alert and oriented x3.  Head: Normocephalic and atraumatic. Eyes: PERRL, EOMI, conjunctivae normal, No scleral icterus.  Neck: Supple, trachea midline, normal ROM, No JVD, masses, thyromegaly, or carotid bruit present.  Cardiovascular: RRR, S1  normal, S2 normal, no murmurs, gallops, or rubs. Pulmonary/Chest: Normal respiratory effort, CTAB, no wheezes, rales, or rhonchi. Abdominal: Soft, non-tender, non-distended, bowel sounds are normal, no masses, organomegaly, or guarding present.  Musculoskeletal: No joint deformities, erythema, or stiffness, ROM full and no nontender. Extremities: No swelling or edema,  pulses symmetric and intact bilaterally. No cyanosis or clubbing. Neurological: A&O x3, Strength is normal and symmetric bilaterally, cranial nerve II-XII are grossly intact, no focal motor deficit, sensory intact to light touch bilaterally.  Skin: Warm, dry and intact. No rashes or erythema. Psychiatric: Normal mood and affect. speech and behavior is normal. Cognition and memory are normal.   Assessment & Plan:   Please see problem-based assessment and plan.

## 2013-07-04 NOTE — Assessment & Plan Note (Signed)
Well controlled today at 111/70. Continue Losartan and Atenolol-Chlorthalidone.

## 2013-07-04 NOTE — Assessment & Plan Note (Signed)
-   Refused flu shot today.

## 2013-07-14 ENCOUNTER — Ambulatory Visit: Payer: Medicaid Other | Admitting: Internal Medicine

## 2013-07-20 ENCOUNTER — Ambulatory Visit (INDEPENDENT_AMBULATORY_CARE_PROVIDER_SITE_OTHER): Payer: Medicaid Other | Admitting: Internal Medicine

## 2013-07-20 ENCOUNTER — Encounter: Payer: Self-pay | Admitting: Internal Medicine

## 2013-07-20 VITALS — BP 133/82 | HR 78 | Temp 97.1°F | Ht 66.0 in | Wt 184.0 lb

## 2013-07-20 DIAGNOSIS — Z860100 Personal history of colon polyps, unspecified: Secondary | ICD-10-CM

## 2013-07-20 DIAGNOSIS — E119 Type 2 diabetes mellitus without complications: Secondary | ICD-10-CM

## 2013-07-20 DIAGNOSIS — G4700 Insomnia, unspecified: Secondary | ICD-10-CM

## 2013-07-20 DIAGNOSIS — E1159 Type 2 diabetes mellitus with other circulatory complications: Secondary | ICD-10-CM

## 2013-07-20 DIAGNOSIS — I10 Essential (primary) hypertension: Secondary | ICD-10-CM

## 2013-07-20 DIAGNOSIS — D332 Benign neoplasm of brain, unspecified: Secondary | ICD-10-CM

## 2013-07-20 DIAGNOSIS — D1779 Benign lipomatous neoplasm of other sites: Secondary | ICD-10-CM

## 2013-07-20 DIAGNOSIS — Z1231 Encounter for screening mammogram for malignant neoplasm of breast: Secondary | ICD-10-CM

## 2013-07-20 DIAGNOSIS — B070 Plantar wart: Secondary | ICD-10-CM

## 2013-07-20 DIAGNOSIS — E785 Hyperlipidemia, unspecified: Secondary | ICD-10-CM

## 2013-07-20 DIAGNOSIS — Z0000 Encounter for general adult medical examination without abnormal findings: Secondary | ICD-10-CM

## 2013-07-20 MED ORDER — IBUPROFEN 800 MG PO TABS: 800.0000 mg | ORAL_TABLET | Freq: Three times a day (TID) | ORAL | Status: DC | PRN

## 2013-07-20 NOTE — Assessment & Plan Note (Signed)
BP Readings from Last 3 Encounters:  07/20/13 133/82  07/03/13 111/70  05/18/13 123/76    Lab Results  Component Value Date   NA 142 11/26/2012   K 3.9 11/26/2012   CREATININE 0.58 02/19/2013    Assessment: Blood pressure control: controlled Progress toward BP goal:  at goal Comments:   Plan: Medications:  continue current medications Educational resources provided:   Self management tools provided:   Other plans: reports compliance.

## 2013-07-20 NOTE — Assessment & Plan Note (Signed)
Lab Results  Component Value Date   HGBA1C 5.8 07/03/2013   HGBA1C 8.6 03/09/2013   HGBA1C 9.0* 11/25/2012     Assessment: Diabetes control: good control (HgbA1C at goal) Progress toward A1C goal:  at goal Comments: she followed instruction and reduced metformin 1000 mg with breakfast. She discontinued Amaryl. No hypoglycemia.  Plan: Medications:  continue current medications Home glucose monitoring: Frequency: 3 times a day Timing: before breakfast;before lunch;before dinner Instruction/counseling given: discussed the need for weight loss Educational resources provided:   Self management tools provided:   Other plans: encouraged her to bring her meter on next visit.

## 2013-07-20 NOTE — Patient Instructions (Signed)
General Instructions: I will refer you to another foot doctor Please take your medications as before  I have called in a prescription of Ibuprofen for your pain. Take 800 mg three time daily.  Please come back in 1 month to see your regular doctor.    Treatment Goals:  Goals (1 Years of Data) as of 07/20/13         As of Today 07/03/13 05/18/13 04/03/13 03/09/13     Blood Pressure    . Blood Pressure < 140/90  133/82 111/70 123/76 122/75 152/92     Result Component    . HEMOGLOBIN A1C < 7.0   5.8   8.6    . LDL CALC < 100            Progress Toward Treatment Goals:  Treatment Goal 07/20/2013  Hemoglobin A1C at goal  Blood pressure at goal  Stop smoking -    Self Care Goals & Plans:  Self Care Goal 07/20/2013  Manage my medications take my medicines as prescribed; bring my medications to every visit; refill my medications on time  Monitor my health keep track of my blood glucose; bring my glucose meter and log to each visit; keep track of my blood pressure; bring my blood pressure log to each visit  Eat healthy foods -  Be physically active -  Stop smoking -  Meeting treatment goals -    Home Blood Glucose Monitoring 07/20/2013  Check my blood sugar 3 times a day  When to check my blood sugar before breakfast; before lunch; before dinner     Care Management & Community Referrals:  Referral 07/03/2013  Referrals made for care management support none needed  Referrals made to community resources -

## 2013-07-20 NOTE — Assessment & Plan Note (Signed)
No signs of abscess formation. Reportedly has some procedure performed her foot on 06/25/2013. Pt requests for a second opinion/referal to a different podiatrist.  Plan  - referral to another podiatrist  - use ibuprofen as need for pain.

## 2013-07-20 NOTE — Progress Notes (Signed)
Patient ID: Bridget Owens, female   DOB: 11/12/1955, 57 y.o.   MRN: [id number]   Subjective:   Patient ID: Bridget Owens female   DOB: 03-Dec-1955 57 y.o.   MRN: [id number]  HPI: Bridget Owens is a 57 y.o. F w/ PMHx of DM type II, HTN, HLD, GERD, OA, left plantar wart and other co-morbidities, presents to clinic for an acute visit requesting for referral to podiatrist due to left plantar pain.   She reports that she was evaluated by a podiatrist on 06/25/2013 for the left plantar pain, and he performed some proceedure on the her foot. However, this has not relieved her pain, and instead the pain has persisted and even gotten slightly worse. It hurts when she steps on the foot. Pain is 8/10, present at rest, throbbing in nature and nonradiating. She denies constitutional symptoms. She request for second opinion from a different podiatrist.  During her last clinic visit 07/03/2013 was instructed to reduce her Metformin to only 1000 mg with breakfast and discontinue Amaryl due to hypoglycemia and with a1c 5.8%. She doesn't have a glucose meter today. No history of hypo-glycemia symptoms since reducing her medications. She tells me that her glucose meter is usually around 120s. The lowest she has had recently is 88, and the highest is 200.     Past Medical History  Diagnosis Date  . Bell's palsy 01/2009    Left sided  . Type II diabetes mellitus   . Hypertension   . Hyperlipidemia   . Allergic rhinitis   . Intolerance, drug     Leg cramps on Lipitor  . GERD (gastroesophageal reflux disease)   . Uterine fibroid   . Chest pain     Exercise stress test negative 6/07  . Postmenopausal symptoms     Hot flashes, vaginal dryness, iritability, difficulty sleeping. as of 2005.  Marland Kitchen Insomnia   . External hemorrhoid   . Lipoma 12/11    Posterior neck.   . Osteoarthritis     Carpometacarpal joint of right thumb  . Trigger finger     Right thumb  . Sebaceous cyst of breast     Has been refered to  derm.  . Adenomatous colon polyp 6/09    Resected on colonoscopy, no high grade dysplasia.   . Hypertensive retinopathy     Followed by Dr. Elmer Picker.  . Diabetic peripheral neuropathy   . Postmenopausal bleeding 9/05    Endometrial biopsy showed  FOCAL TUBAL METAPLASIA   . Colon polyps     03/22/2008: adenomatous polyps x3 w no dysplasia. Needs repeat colonoscopy in 3 years   Current Outpatient Prescriptions  Medication Sig Dispense Refill  . aspirin 81 MG EC tablet Take 81 mg by mouth daily.        Marland Kitchen atenolol-chlorthalidone (TENORETIC) 50-25 MG per tablet Take 1 tablet by mouth daily.  30 tablet  5  . atorvastatin (LIPITOR) 40 MG tablet Take 1 tablet (40 mg total) by mouth daily.  30 tablet  11  . benzonatate (TESSALON PERLES) 100 MG capsule Take 1 capsule (100 mg total) by mouth every 6 (six) hours as needed for cough.  30 capsule  0  . Blood Glucose Monitoring Suppl (ACCU-CHEK NANO SMARTVIEW) W/DEVICE KIT 1 each by Does not apply route 2 (two) times daily. Dx code 250.00  1 kit  0  . cetirizine (ZYRTEC ALLERGY) 10 MG tablet Take 1 tablet (10 mg total) by mouth daily.  30 tablet  3  .  diphenhydrAMINE (BENADRYL) 25 MG tablet Take 25 mg by mouth every 6 (six) hours as needed for allergies.      . fluconazole (DIFLUCAN) 150 MG tablet Take 1 tablet (150 mg total) by mouth once.  2 tablet  0  . gabapentin (NEURONTIN) 300 MG capsule Take 1 capsule (300 mg total) by mouth 2 (two) times daily as needed.  60 capsule  0  . ibuprofen (ADVIL,MOTRIN) 800 MG tablet Take 1 tablet (800 mg total) by mouth every 8 (eight) hours as needed for pain.  30 tablet  0  . losartan (COZAAR) 100 MG tablet Take 1 tablet (100 mg total) by mouth daily.  30 tablet  5  . metFORMIN (GLUMETZA) 1000 MG (MOD) 24 hr tablet Take 1 tablet (1,000 mg total) by mouth daily with breakfast.  60 tablet  5  . [DISCONTINUED] pantoprazole (PROTONIX) 20 MG tablet Take 1 tablet (20 mg total) by mouth daily.  30 tablet  0   No current  facility-administered medications for this visit.   Family History  Problem Relation Age of Onset  . Pneumonia Mother   . Diabetes Mother   . Early death Father   . Diabetes Sister   . Diabetes Brother   . Diabetes Maternal Grandmother    History   Social History  . Marital Status: Single    Spouse Name: N/A    Number of Children: N/A  . Years of Education: N/A   Social History Main Topics  . Smoking status: Current Every Day Smoker -- 0.30 packs/day for 30 years    Types: Cigarettes  . Smokeless tobacco: Former Neurosurgeon    Quit date: 11/28/2010     Comment: trying to quit .  Doing a little less now.  . Alcohol Use: No  . Drug Use: No  . Sexual Activity: None   Other Topics Concern  . None   Social History Narrative  . None   Review of Systems: General: Denies fever, chills, diaphoresis, appetite change and fatigue.  Respiratory: Denies SOB, DOE, cough, chest tightness, and wheezing.   Cardiovascular: Denies chest pain, palpitations and leg swelling.  Gastrointestinal: Denies nausea, vomiting, abdominal pain, diarrhea, constipation, blood in stool and abdominal distention.  Genitourinary: Denies dysuria, urgency, frequency, hematuria, flank pain and difficulty urinating.  Endocrine: Positive for chronic hot flashes. Denies hot or cold intolerance, sweats, polyuria, polydipsia. Musculoskeletal: Denies myalgias, back pain, joint swelling, arthralgias and gait problem.  Skin: Denies pallor, rash and wounds.  Neurological: Denies seizures, syncope, weakness, numbness and headaches.  Psychiatric/Behavioral: Denies mood changes, confusion, nervousness, sleep disturbance and agitation.  Objective:  Physical Exam: Filed Vitals:   07/20/13 0832  BP: 133/82  Pulse: 78  Temp: 97.1 F (36.2 C)  TempSrc: Oral  Height: 5\' 6"  (1.676 m)  Weight: 184 lb (83.462 kg)  SpO2: 99%   General: Vital signs reviewed.  Patient is a well-developed and well-nourished, in no acute distress  and cooperative with exam. Alert and oriented x3.  Head: Normocephalic and atraumatic. Eyes: PERRL, EOMI, conjunctivae normal, No scleral icterus.  Neck: Supple, trachea midline, normal ROM, No JVD, masses, thyromegaly, or carotid bruit present.  Cardiovascular: RRR, S1 normal, S2 normal, no murmurs, gallops, or rubs. Pulmonary/Chest: Normal respiratory effort, CTAB, no wheezes, rales, or rhonchi. Abdominal: Soft, non-tender, non-distended, bowel sounds are normal, no masses, organomegaly, or guarding present.  Musculoskeletal: No joint deformities, erythema, or stiffness, ROM full and no nontender. Extremities: No swelling or edema,  pulses symmetric and intact  bilaterally. No cyanosis or clubbing. Left foot: Lateral forefoot plantar callus formation. Tender to deep palpation. No fluctuance. No signs of increased inflammation or infection. Lateral pulses are present with good capillary fill.  Neurological: A&O x3, Strength is normal and symmetric bilaterally, cranial nerve II-XII are grossly intact, no focal motor deficit, sensory intact to light touch bilaterally.  Skin: Warm, dry and intact. No rashes or erythema. Psychiatric: Normal mood and affect. speech and behavior is normal. Cognition and memory are normal.   Assessment & Plan:   Discussed patient with Dr Josem Kaufmann. Please see problem-based assessment and plan.

## 2013-07-20 NOTE — Progress Notes (Signed)
Case discussed with Dr. Kazibwe at the time of the visit.  We reviewed the resident's history and exam and pertinent patient test results.  I agree with the assessment, diagnosis and plan of care documented in the resident's note. 

## 2013-08-21 ENCOUNTER — Ambulatory Visit (INDEPENDENT_AMBULATORY_CARE_PROVIDER_SITE_OTHER): Payer: Medicaid Other | Admitting: Internal Medicine

## 2013-08-21 VITALS — BP 176/86 | HR 56 | Temp 96.5°F | Ht 66.0 in | Wt 181.3 lb

## 2013-08-21 DIAGNOSIS — R1111 Vomiting without nausea: Secondary | ICD-10-CM | POA: Insufficient documentation

## 2013-08-21 DIAGNOSIS — E1159 Type 2 diabetes mellitus with other circulatory complications: Secondary | ICD-10-CM

## 2013-08-21 DIAGNOSIS — R1110 Vomiting, unspecified: Secondary | ICD-10-CM

## 2013-08-21 DIAGNOSIS — I10 Essential (primary) hypertension: Secondary | ICD-10-CM

## 2013-08-21 DIAGNOSIS — E119 Type 2 diabetes mellitus without complications: Secondary | ICD-10-CM

## 2013-08-21 DIAGNOSIS — J309 Allergic rhinitis, unspecified: Secondary | ICD-10-CM

## 2013-08-21 DIAGNOSIS — G44209 Tension-type headache, unspecified, not intractable: Secondary | ICD-10-CM

## 2013-08-21 LAB — GLUCOSE, CAPILLARY: Glucose-Capillary: 209 mg/dL — ABNORMAL HIGH (ref 70–99)

## 2013-08-21 MED ORDER — ACETAMINOPHEN 500 MG PO TABS: 1000.0000 mg | ORAL_TABLET | Freq: Four times a day (QID) | ORAL | Status: DC | PRN

## 2013-08-21 MED ORDER — PROMETHAZINE HCL 12.5 MG PO TABS: 12.5000 mg | ORAL_TABLET | Freq: Four times a day (QID) | ORAL | Status: DC | PRN

## 2013-08-21 MED ORDER — FLUTICASONE PROPIONATE 50 MCG/ACT NA SUSP: 1.0000 | Freq: Every day | NASAL | Status: DC

## 2013-08-21 NOTE — Assessment & Plan Note (Signed)
Sinus congestion may be contributing, no temporal tenderness, no red flags for organic cause Occurs about every other day in the evening at bed time Tylenol 100 mg q6h x 2 days then prn Flonase for rhinitis and nasal congestion

## 2013-08-21 NOTE — Progress Notes (Signed)
  Subjective:    Patient ID: Bridget Owens, female    DOB: [birth year], 57 y.o.   MRN: [id number]  HPI  History significant for diabetes type 2 poorly controlled, left-sided Bell's palsy, and hypertension. Presents for headaches past 2 weeks.  Usually gets them at night before bed, bilateral, constant sharp, no tenderness, no neck pain or tightness, no photophobia, no nausea.  Reports continued runny nose " for years" and bilateral ear fullness "off & on".  Review of Systems  Constitutional: Negative for fever, chills and fatigue.  HENT: Positive for rhinorrhea. Negative for ear discharge, ear pain, facial swelling, hearing loss, mouth sores, nosebleeds, postnasal drip, sinus pressure, sneezing, sore throat and tinnitus.   Eyes: Negative for photophobia and pain.  Respiratory: Positive for cough. Negative for chest tightness and shortness of breath.        Occ cough  Cardiovascular: Negative for chest pain.  Gastrointestinal: Positive for vomiting. Negative for nausea, diarrhea and constipation.  Endocrine: Negative.   Genitourinary: Negative for dysuria.  Musculoskeletal: Negative.   Allergic/Immunologic: Positive for environmental allergies.  Neurological: Positive for headaches. Negative for dizziness, syncope, facial asymmetry, speech difficulty, weakness, light-headedness and numbness.  Psychiatric/Behavioral: Negative.        Objective:   Physical Exam  Constitutional: She appears well-developed and well-nourished. No distress.  HENT:  Head: Normocephalic and atraumatic. Hair is abnormal.  Right Ear: Hearing, tympanic membrane, external ear and ear canal normal.  Left Ear: Hearing, tympanic membrane, external ear and ear canal normal.  Nose: Mucosal edema and rhinorrhea present. No sinus tenderness. Right sinus exhibits no maxillary sinus tenderness and no frontal sinus tenderness. Left sinus exhibits no maxillary sinus tenderness and no frontal sinus tenderness.  Mouth/Throat:  Uvula is midline, oropharynx is clear and moist and mucous membranes are normal. No posterior oropharyngeal edema.  + alopecia (central)          Assessment & Plan:  See separate problem list:  #1 Tension type Headache: no red alarms to suggest organic cause, no systemic illness, no focal temporal tenderness, no neck stiffness, papilledema or altered mental status. -Tylenol 1000 mg q6h prn  #2 Allergic Rhinitis: Rx Flonase, may have post nasal drip causing occasional cough (upper airway cough syndrome) -if cough becomes chronic consider chlorampheniramine or daily antihistamine ie Claritin or Zyrtec  #3 hypertension: above goal, pt has not had bp meds yet, discussed taking antiemetic in morning prior to taking medications for better bp control -no changes, cont losartan 100 mg qd, atenolol-chlorthalidone 50-25 mg qd

## 2013-08-21 NOTE — Assessment & Plan Note (Signed)
Pt may have gastroparesis given poorly controlled diabetes.   -phenergan prn -consider Reglan if continues and/or referral to GI

## 2013-08-21 NOTE — Patient Instructions (Addendum)
General Instructions: For your headache, take Tylenol Extra Strength every 6 hours for 2 full days then as needed. Use the nasal spray for your runny nose. Take the phenergan when you think that you will vomit. Return in 3 months to see your Primary Doctor or sooner if you continue to have headaches.   Treatment Goals:  Goals (1 Years of Data) as of 08/21/13         As of Today 07/20/13 07/03/13 05/18/13 04/03/13     Blood Pressure    . Blood Pressure < 140/90  176/86 133/82 111/70 123/76 122/75     Result Component    . HEMOGLOBIN A1C < 7.0    5.8      . LDL CALC < 100            Progress Toward Treatment Goals:  Treatment Goal 08/21/2013  Hemoglobin A1C unable to assess  Blood pressure unchanged  Stop smoking smoking the same amount    Self Care Goals & Plans:  Self Care Goal 08/21/2013  Manage my medications take my medicines as prescribed; refill my medications on time  Monitor my health keep track of my blood glucose; bring my glucose meter and log to each visit; check my feet daily  Eat healthy foods -  Be physically active -  Stop smoking -  Meeting treatment goals -    Home Blood Glucose Monitoring 07/20/2013  Check my blood sugar 3 times a day  When to check my blood sugar before breakfast; before lunch; before dinner     Care Management & Community Referrals:  Referral 07/03/2013  Referrals made for care management support none needed  Referrals made to community resources -

## 2013-08-24 NOTE — Progress Notes (Signed)
Case discussed with Dr. Schooler at the time of the visit.  We reviewed the resident's history and exam and pertinent patient test results.  I agree with the assessment, diagnosis, and plan of care documented in the resident's note.     

## 2013-08-25 ENCOUNTER — Other Ambulatory Visit: Payer: Self-pay | Admitting: *Deleted

## 2013-08-26 MED ORDER — ZOLPIDEM TARTRATE ER 12.5 MG PO TBCR: 12.5000 mg | EXTENDED_RELEASE_TABLET | Freq: Every evening | ORAL | Status: DC | PRN

## 2013-08-27 NOTE — Telephone Encounter (Signed)
Called to pharm 

## 2013-09-07 ENCOUNTER — Other Ambulatory Visit: Payer: Self-pay | Admitting: *Deleted

## 2013-09-07 MED ORDER — LOSARTAN POTASSIUM 100 MG PO TABS: 100.0000 mg | ORAL_TABLET | Freq: Every day | ORAL | Status: DC

## 2013-09-10 ENCOUNTER — Telehealth: Payer: Self-pay | Admitting: *Deleted

## 2013-09-10 ENCOUNTER — Other Ambulatory Visit: Payer: Self-pay | Admitting: Internal Medicine

## 2013-09-10 NOTE — Telephone Encounter (Signed)
Call to Medicaid at [phone] for Prior Authorization for Lorsartan 100 mg tablets.  Approved 09/10/2013 thru 09/05/2014.  Approval was called to [employer] on [address] at [phone].  Angelina Ok, RN 09/10/2013 10:04 AM.

## 2013-10-06 ENCOUNTER — Encounter (HOSPITAL_COMMUNITY): Payer: Self-pay | Admitting: Emergency Medicine

## 2013-10-06 ENCOUNTER — Emergency Department (INDEPENDENT_AMBULATORY_CARE_PROVIDER_SITE_OTHER)
Admission: EM | Admit: 2013-10-06 | Discharge: 2013-10-06 | Disposition: A | Discharge status: Home / Self Care | Payer: Medicaid Other | Source: Home / Self Care | Attending: Emergency Medicine | Admitting: Emergency Medicine

## 2013-10-06 ENCOUNTER — Other Ambulatory Visit (HOSPITAL_COMMUNITY)
Admission: RE | Admit: 2013-10-06 | Discharge: 2013-10-06 | Disposition: A | Discharge status: Home / Self Care | Payer: Medicaid Other | Source: Ambulatory Visit | Attending: Emergency Medicine | Admitting: Emergency Medicine

## 2013-10-06 DIAGNOSIS — N760 Acute vaginitis: Secondary | ICD-10-CM

## 2013-10-06 DIAGNOSIS — Z113 Encounter for screening for infections with a predominantly sexual mode of transmission: Secondary | ICD-10-CM | POA: Insufficient documentation

## 2013-10-06 LAB — POCT URINALYSIS DIP (DEVICE)
Bilirubin Urine: NEGATIVE
Glucose, UA: >=1000 mg/dL — AB
Ketones, ur: NEGATIVE mg/dL
Leukocytes, UA: NEGATIVE
Nitrite: NEGATIVE
Protein, ur: NEGATIVE mg/dL
Specific Gravity, Urine: 1.015 (ref 1.005–1.030)
Urobilinogen, UA: 0.2 mg/dL (ref 0.0–1.0)
pH: 7 (ref 5.0–8.0)

## 2013-10-06 MED ORDER — FLUCONAZOLE 150 MG PO TABS: 150.0000 mg | ORAL_TABLET | Freq: Once | ORAL | Status: DC

## 2013-10-06 MED ORDER — TERCONAZOLE 0.4 % VA CREA: 1.0000 | TOPICAL_CREAM | Freq: Every day | VAGINAL | Status: DC

## 2013-10-06 MED ORDER — METRONIDAZOLE 500 MG PO TABS: 500.0000 mg | ORAL_TABLET | Freq: Two times a day (BID) | ORAL | Status: DC

## 2013-10-06 NOTE — ED Provider Notes (Signed)
Chief Complaint:   Chief Complaint  Patient presents with  . Vaginitis    History of Present Illness:   Bridget Owens is a 57 year old diabetic female who has gotten off her diet over the holidays and her blood sugars suffered as a result. She's been more careful the past couple days about her diet and try to get extra fluids. Her blood sugars are coming down, but now she has vulvar and vaginal itching, burning, swelling, and discharge. It burns a little when she urinates. She denies any odor or pelvic pain. She's had no fever, chills, nausea, or vomiting.  Review of Systems:  Other than noted above, the patient denies any of the following symptoms: Systemic:  No fever, chills, sweats, or weight loss. GI:  No abdominal pain, nausea, anorexia, vomiting, diarrhea, constipation, melena or hematochezia. GU:  No dysuria, frequency, urgency, hematuria, vaginal discharge, itching, or abnormal vaginal bleeding. Skin:  No rash or itching.  PMFSH:  Past medical history, family history, social history, meds, and allergies were reviewed.  She has no medication allergies. She takes aspirin, Tenoretic, Lipitor, Cozaar, and metformin. She has diabetes, hypertension, and cholesterol problems.  Physical Exam:   Vital signs:  BP 142/74  Pulse 69  Temp(Src) 97.8 F (36.6 C) (Oral)  Resp 16  SpO2 100% General:  Alert, oriented and in no distress. Lungs:  Breath sounds clear and equal bilaterally.  No wheezes, rales or rhonchi. Heart:  Regular rhythm.  No gallops or murmers. Abdomen:  Soft, flat and non-distended.  No organomegaly or mass.  No tenderness, guarding or rebound.  Bowel sounds normally active. Pelvic exam:  Slight swelling of the external genitalia, but no ulcerations. Vaginal and cervical mucosa are atrophic. There is a moderate amount of homogeneous, white, non-malodorous discharge. The cervix was normal. There was no pain on cervical motion uterus was normal in size and nontender. No adnexal  tenderness or mass. DNA probes for gonorrhea, Chlamydia, Trichomonas, gonorrhea, and Candida were obtained. Skin:  Clear, warm and dry.  Labs:   Results for orders placed during the hospital encounter of 10/06/13  POCT URINALYSIS DIP (DEVICE)      Result Value Range   Glucose, UA >=1000 (*) NEGATIVE mg/dL   Bilirubin Urine NEGATIVE  NEGATIVE   Ketones, ur NEGATIVE  NEGATIVE mg/dL   Specific Gravity, Urine 1.015  1.005 - 1.030   Hgb urine dipstick TRACE (*) NEGATIVE   pH 7.0  5.0 - 8.0   Protein, ur NEGATIVE  NEGATIVE mg/dL   Urobilinogen, UA 0.2  0.0 - 1.0 mg/dL   Nitrite NEGATIVE  NEGATIVE   Leukocytes, UA NEGATIVE  NEGATIVE    Assessment:  The encounter diagnosis was Vaginitis.  Plan:   1.  Meds:  The following meds were prescribed:   Discharge Medication List as of 10/06/2013  1:24 PM    START taking these medications   Details  !! fluconazole (DIFLUCAN) 150 MG tablet Take 1 tablet (150 mg total) by mouth once., Starting 10/06/2013, Normal    metroNIDAZOLE (FLAGYL) 500 MG tablet Take 1 tablet (500 mg total) by mouth 2 (two) times daily., Starting 10/06/2013, Until Discontinued, Normal    terconazole (TERAZOL 7) 0.4 % vaginal cream Place 1 applicator vaginally at bedtime., Starting 10/06/2013, Until Discontinued, Normal     !! - Potential duplicate medications found. Please discuss with provider.      2.  Patient Education/Counseling:  The patient was given appropriate handouts, self care instructions, and instructed in symptomatic  relief.  Importance of good diabetic control was discussed with her.  3.  Follow up:  The patient was told to follow up if no better in 3 to 4 days, if becoming worse in any way, and given some red flag symptoms such as pelvic pain or fever which would prompt immediate return.  Follow up here as necessary.     Reuben Likes, MD 10/06/13 [phone]

## 2013-10-06 NOTE — ED Notes (Signed)
reportedly has had her sugar under control , but her private parts are swollen. Think I have another yeast infection; celibate for 4 years

## 2013-10-06 NOTE — ED Notes (Signed)
Discussed medication compliance, precautions, diet compliance to prevent future yeast problems. Call back number verified at release

## 2013-10-09 ENCOUNTER — Other Ambulatory Visit: Payer: Self-pay | Admitting: *Deleted

## 2013-10-09 NOTE — ED Notes (Signed)
GC/Chlamydia neg., Affirm: Candida and Trich neg., Gardnerella pos. Pt. adequately treated with Flagyl. Ardith Lewman M 10/09/2013  

## 2013-10-11 MED ORDER — ATENOLOL-CHLORTHALIDONE 50-25 MG PO TABS: 1.0000 | ORAL_TABLET | Freq: Every day | ORAL | Status: DC

## 2013-10-23 ENCOUNTER — Telehealth: Payer: Self-pay | Admitting: Dietician

## 2013-10-23 NOTE — Telephone Encounter (Signed)
Meter reading 300-400 and she doesn't think it is correct because she feels fine. She did her blood sugar while we were on the  Phone with the result of 480. She denies headache, weakness, feeling bad or having trouble sleeping,  Fever, itching in her crotch is better, feet are fine, looks at them daily.. Eating fine- a lot of greens, taking her medicine as directed every day "Glumetza".   Note last A1C in 08-26-13 was 5.8% she is due back on 11-21-13. It appears that last fall (sept/october) she had been having hypoglycemia on 2000 mg extended release of Metformin in the morning and 8 mg Glimiperide so her metformin was decreased to 1000 mg once daily and glimiperide stopped.   Discussed that she'd likely need to be seen here sooner than 11-21-13 and discussed call with triage nurse to whom I am routing this note.

## 2013-10-23 NOTE — Telephone Encounter (Signed)
Please have her come in some time early next week to have her blood sugar checked.

## 2013-10-23 NOTE — Telephone Encounter (Signed)
Dr Ronnald Ramp, would you like the pt seen by someone else, you have nothing available?

## 2013-10-24 ENCOUNTER — Encounter (HOSPITAL_COMMUNITY): Payer: Self-pay | Admitting: Emergency Medicine

## 2013-10-24 ENCOUNTER — Emergency Department (HOSPITAL_COMMUNITY)
Admission: EM | Admit: 2013-10-24 | Discharge: 2013-10-24 | Disposition: A | Discharge status: Home / Self Care | Payer: Medicaid Other | Attending: Emergency Medicine | Admitting: Emergency Medicine

## 2013-10-24 DIAGNOSIS — I10 Essential (primary) hypertension: Secondary | ICD-10-CM | POA: Insufficient documentation

## 2013-10-24 DIAGNOSIS — E1149 Type 2 diabetes mellitus with other diabetic neurological complication: Secondary | ICD-10-CM | POA: Insufficient documentation

## 2013-10-24 DIAGNOSIS — E119 Type 2 diabetes mellitus without complications: Secondary | ICD-10-CM

## 2013-10-24 DIAGNOSIS — IMO0002 Reserved for concepts with insufficient information to code with codable children: Secondary | ICD-10-CM | POA: Insufficient documentation

## 2013-10-24 DIAGNOSIS — Z8719 Personal history of other diseases of the digestive system: Secondary | ICD-10-CM | POA: Insufficient documentation

## 2013-10-24 DIAGNOSIS — R739 Hyperglycemia, unspecified: Secondary | ICD-10-CM

## 2013-10-24 DIAGNOSIS — Z860100 Personal history of colon polyps, unspecified: Secondary | ICD-10-CM | POA: Insufficient documentation

## 2013-10-24 DIAGNOSIS — Z7982 Long term (current) use of aspirin: Secondary | ICD-10-CM | POA: Insufficient documentation

## 2013-10-24 DIAGNOSIS — R300 Dysuria: Secondary | ICD-10-CM | POA: Insufficient documentation

## 2013-10-24 DIAGNOSIS — F17200 Nicotine dependence, unspecified, uncomplicated: Secondary | ICD-10-CM | POA: Insufficient documentation

## 2013-10-24 DIAGNOSIS — M1990 Unspecified osteoarthritis, unspecified site: Secondary | ICD-10-CM | POA: Insufficient documentation

## 2013-10-24 DIAGNOSIS — Z79899 Other long term (current) drug therapy: Secondary | ICD-10-CM | POA: Insufficient documentation

## 2013-10-24 DIAGNOSIS — Z8742 Personal history of other diseases of the female genital tract: Secondary | ICD-10-CM | POA: Insufficient documentation

## 2013-10-24 DIAGNOSIS — E785 Hyperlipidemia, unspecified: Secondary | ICD-10-CM | POA: Insufficient documentation

## 2013-10-24 DIAGNOSIS — Z8709 Personal history of other diseases of the respiratory system: Secondary | ICD-10-CM | POA: Insufficient documentation

## 2013-10-24 DIAGNOSIS — G4700 Insomnia, unspecified: Secondary | ICD-10-CM | POA: Insufficient documentation

## 2013-10-24 DIAGNOSIS — E1142 Type 2 diabetes mellitus with diabetic polyneuropathy: Secondary | ICD-10-CM | POA: Insufficient documentation

## 2013-10-24 DIAGNOSIS — Z872 Personal history of diseases of the skin and subcutaneous tissue: Secondary | ICD-10-CM | POA: Insufficient documentation

## 2013-10-24 LAB — CBC WITH DIFFERENTIAL/PLATELET
Basophils Absolute: 0.1 10*3/uL (ref 0.0–0.1)
Basophils Relative: 1 % (ref 0–1)
Eosinophils Absolute: 0.1 10*3/uL (ref 0.0–0.7)
Eosinophils Relative: 1 % (ref 0–5)
HCT: 42.6 % (ref 36.0–46.0)
Hemoglobin: 15.5 g/dL — ABNORMAL HIGH (ref 12.0–15.0)
Lymphocytes Relative: 36 % (ref 12–46)
Lymphs Abs: 2.7 10*3/uL (ref 0.7–4.0)
MCH: 31.3 pg (ref 26.0–34.0)
MCHC: 36.4 g/dL — ABNORMAL HIGH (ref 30.0–36.0)
MCV: 85.9 fL (ref 78.0–100.0)
Monocytes Absolute: 0.4 10*3/uL (ref 0.1–1.0)
Monocytes Relative: 5 % (ref 3–12)
Neutro Abs: 4.4 10*3/uL (ref 1.7–7.7)
Neutrophils Relative %: 58 % (ref 43–77)
Platelets: 301 10*3/uL (ref 150–400)
RBC: 4.96 MIL/uL (ref 3.87–5.11)
RDW: 12.4 % (ref 11.5–15.5)
WBC: 7.6 10*3/uL (ref 4.0–10.5)

## 2013-10-24 LAB — BASIC METABOLIC PANEL
BUN: 15 mg/dL (ref 6–23)
CO2: 23 mEq/L (ref 19–32)
Calcium: 9.8 mg/dL (ref 8.4–10.5)
Chloride: 92 mEq/L — ABNORMAL LOW (ref 96–112)
Creatinine, Ser: 0.57 mg/dL (ref 0.50–1.10)
GFR calc Af Amer: >90 mL/min (ref >90)
GFR calc non Af Amer: >90 mL/min (ref >90)
Glucose, Bld: 331 mg/dL — ABNORMAL HIGH (ref 70–99)
Potassium: 3.8 mEq/L (ref 3.7–5.3)
Sodium: 133 mEq/L — ABNORMAL LOW (ref 137–147)

## 2013-10-24 LAB — URINE MICROSCOPIC-ADD ON

## 2013-10-24 LAB — URINALYSIS, ROUTINE W REFLEX MICROSCOPIC
Bilirubin Urine: NEGATIVE
Glucose, UA: >1000 mg/dL — AB
Hgb urine dipstick: NEGATIVE
Ketones, ur: NEGATIVE mg/dL
Leukocytes, UA: NEGATIVE
Nitrite: NEGATIVE
Protein, ur: NEGATIVE mg/dL
Specific Gravity, Urine: 1.008 (ref 1.005–1.030)
Urobilinogen, UA: 0.2 mg/dL (ref 0.0–1.0)
pH: 6 (ref 5.0–8.0)

## 2013-10-24 LAB — GLUCOSE, CAPILLARY: Glucose-Capillary: 329 mg/dL — ABNORMAL HIGH (ref 70–99)

## 2013-10-24 NOTE — Discharge Instructions (Signed)
Hyperglycemia °Hyperglycemia occurs when the glucose (sugar) in your blood is too high. Hyperglycemia can happen for many reasons, but it most often happens to people who do not know they have diabetes or are not managing their diabetes properly.  °CAUSES  °Whether you have diabetes or not, there are other causes of hyperglycemia. Hyperglycemia can occur when you have diabetes, but it can also occur in other situations that you might not be as aware of, such as: °Diabetes °· If you have diabetes and are having problems controlling your blood glucose, hyperglycemia could occur because of some of the following reasons: °· Not following your meal plan. °· Not taking your diabetes medications or not taking it properly. °· Exercising less or doing less activity than you normally do. °· Being sick. °Pre-diabetes °· This cannot be ignored. Before people develop Type 2 diabetes, they almost always have "pre-diabetes." This is when your blood glucose levels are higher than normal, but not yet high enough to be diagnosed as diabetes. Research has shown that some long-term damage to the body, especially the heart and circulatory system, may already be occurring during pre-diabetes. If you take action to manage your blood glucose when you have pre-diabetes, you may delay or prevent Type 2 diabetes from developing. °Stress °· If you have diabetes, you may be "diet" controlled or on oral medications or insulin to control your diabetes. However, you may find that your blood glucose is higher than usual in the hospital whether you have diabetes or not. This is often referred to as "stress hyperglycemia." Stress can elevate your blood glucose. This happens because of hormones put out by the body during times of stress. If stress has been the cause of your high blood glucose, it can be followed regularly by your caregiver. That way he/she can make sure your hyperglycemia does not continue to get worse or progress to  diabetes. °Steroids °· Steroids are medications that act on the infection fighting system (immune system) to block inflammation or infection. One side effect can be a rise in blood glucose. Most people can produce enough extra insulin to allow for this rise, but for those who cannot, steroids make blood glucose levels go even higher. It is not unusual for steroid treatments to "uncover" diabetes that is developing. It is not always possible to determine if the hyperglycemia will go away after the steroids are stopped. A special blood test called an A1c is sometimes done to determine if your blood glucose was elevated before the steroids were started. °SYMPTOMS °· Thirsty. °· Frequent urination. °· Dry mouth. °· Blurred vision. °· Tired or fatigue. °· Weakness. °· Sleepy. °· Tingling in feet or leg. °DIAGNOSIS  °Diagnosis is made by monitoring blood glucose in one or all of the following ways: °· A1c test. This is a chemical found in your blood. °· Fingerstick blood glucose monitoring. °· Laboratory results. °TREATMENT  °First, knowing the cause of the hyperglycemia is important before the hyperglycemia can be treated. Treatment may include, but is not be limited to: °· Education. °· Change or adjustment in medications. °· Change or adjustment in meal plan. °· Treatment for an illness, infection, etc. °· More frequent blood glucose monitoring. °· Change in exercise plan. °· Decreasing or stopping steroids. °· Lifestyle changes. °HOME CARE INSTRUCTIONS  °· Test your blood glucose as directed. °· Exercise regularly. Your caregiver will give you instructions about exercise. Pre-diabetes or diabetes which comes on with stress is helped by exercising. °· Eat wholesome,   balanced meals. Eat often and at regular, fixed times. Your caregiver or nutritionist will give you a meal plan to guide your sugar intake.  Being at an ideal weight is important. If needed, losing as little as 10 to 15 pounds may help improve blood  glucose levels. SEEK MEDICAL CARE IF:   You have questions about medicine, activity, or diet.  You continue to have symptoms (problems such as increased thirst, urination, or weight gain). SEEK IMMEDIATE MEDICAL CARE IF:   You are vomiting or have diarrhea.  Your breath smells fruity.  You are breathing faster or slower.  You are very sleepy or incoherent.  You have numbness, tingling, or pain in your feet or hands.  You have chest pain.  Your symptoms get worse even though you have been following your caregiver's orders.  If you have any other questions or concerns. Document Released: 03/20/2001 Document Revised: 12/17/2011 Document Reviewed: 01/21/2012 [facility] Patient Information 2014 [place], [place].  Type 2 Diabetes Mellitus, Adult Type 2 diabetes mellitus is a long-term (chronic) disease. In type 2 diabetes:  The pancreas does not make enough of a hormone called insulin.  The cells in the body do not respond as well to the insulin that is made.  Both of the above can happen. Normally, insulin moves sugars from food into tissue cells. This gives you energy. If you have type 2 diabetes, sugars cannot be moved into tissue cells. This causes high blood sugar (hyperglycemia).  HOME CARE  Have your hemoglobin A1c level checked twice a year. The level shows if your diabetes is under control or out of control.  Perform daily blood sugar testing as told by your doctor.  Check your ketone levels by testing your pee (urine) when you are sick and as told.  Take your diabetes or insulin medicine as told by your doctor.  Never run out of insulin.  Adjust how much insulin you give yourself based on how many carbs (carbohydrates) you eat. Carbs are in many foods, such as fruits, vegetables, whole grains, and dairy products.  Have a healthy snack between every healthy meal. Have 3 meals and 3 snacks a day.  Lose weight if you are overweight.  Carry a medical alert card or  wear your medical alert jewelry.  Carry a 15 gram carb snack with you at all times. Examples include:  Glucose pills, 3 or 4.  Glucose gel, 15 gram tube.  Raisins, 2 tablespoons (24 grams).  Jelly beans, 6.  Animal crackers, 8.  Sugar pop, 4 ounces (120 milliliters).  Gummy treats, 9.  Notice low blood sugar (hypoglycemia) symptoms, such as:  Shaking (tremors).  Decreased ability to think clearly.  Sweating.  Increased heart rate.  Headache.  Dry mouth.  Hunger.  Crabbiness (irritability).  Being worried or tense (anxiety).  Restless sleep.  A change in speech or coordination.  Confusion.  Treat low blood sugar right away. If you are alert and can swallow, follow the 15:15 rule:  Take 15 20 grams of a rapid-acting glucose or carb. This includes glucose gel, glucose pills, or 4 ounces (120 milliliters) of fruit juice, regular pop, or low-fat milk.  Check your blood sugar level after taking the glucose.  Take 15 20 grams of more glucose if the repeat blood sugar level is still 70 mg/dL (milligrams/deciliter) or below.  Eat a meal or snack within 1 hour of the blood sugar levels going back to normal.  Notice early symptoms of high blood sugar, such  as:  Being really thirsty or drinking a lot (polydipsia).  Peeing (urinating) a lot (polyuria).  Do at least 150 minutes of physical activity a week or as told.  Split the 150 minutes of activity up during the week. Do not do 150 minutes of activity in one day.  Perform exercises, such as weight lifting, at least 2 times a week or as told.  Adjust your insulin or food intake as needed if you start a new exercise or sport.  Follow your sick day plan when you are not able to eat or drink as usual.  Avoid tobacco use.  Women who are not pregnant should drink no more than 1 drink a day. Men should drink no more than 2 drinks a day.  Only drink alcohol with food.  Ask your doctor if alcohol is safe for  you.  Tell your doctor if you drink alcohol several times during the week.  See your doctor regularly.  Schedule an eye exam soon after you are diagnosed with diabetes. Schedule exams once every year.  Check your skin and feet every day. Check for cuts, bruises, redness, nail problems, bleeding, blisters, or sores. A doctor should do a foot exam once a year.  Brush your teeth and gums twice a day. Floss once a day. Visit your dentist regularly.  Share your diabetes plan with your workplace or school.  Stay up-to-date with shots that fight against diseases (immunizations).  Learn how to manage stress.  Get diabetes education and support as needed.  Ask your doctor for special help if:  You need help to maintain or improve how you to do things on your own.  You need help to maintain or improve the quality of your life.  You have foot or hand problems.  You have trouble cleaning yourself, dressing, eating, or doing physical activity. GET HELP RIGHT AWAY IF:  You have trouble breathing.  You have moderate to large ketone levels.  You are unable to eat food or drink fluids for more than 6 hours.  You feel sick to your stomach (nauseous) or throw up (vomit) for more than 6 hours.  Your blood sugar level is over 240 mg/dL.  There is a change in mental status.  You get another serious illness.  You have watery poop (diarrhea) for more than 6 hours.  You have been sick or have had a fever for 2 or more days and are not getting better.  You have pain when you are physically active. MAKE SURE YOU:  Understand these instructions.  Will watch your condition.  Will get help right away if you are not doing well or get worse. Document Released: 07/03/2008 Document Revised: 07/15/2013 Document Reviewed: 01/23/2013 [facility] Patient Information 2014 [place], [place].

## 2013-10-24 NOTE — ED Notes (Signed)
Pt states she was started on an antibiotic on 12/30 and her CBG has been high since.  Pt doesn't check her CBG regularly because she ran out of lancets.

## 2013-10-24 NOTE — ED Provider Notes (Signed)
CSN: [id number]     Arrival date & time 10/24/13  28 History   First MD Initiated Contact with Patient 10/24/13 1925     Chief Complaint  Patient presents with  . Hyperglycemia   (Consider location/radiation/quality/duration/timing/severity/associated sxs/prior Treatment) HPI Comments: Patient is a 58 year old female history of diabetes, hypertension, hyperlipidemia, GERD who presents today for elevated blood glucose readings. She reports that her blood glucose has been in the 300s, but she has not been measuring it frequently do to a lack of lancets. She denies any recent illness, but in December had vaginitis. This has significantly improved. She has associated dysuria. She denies any other symptoms including nausea, vomiting, abdominal pain, chest pain, shortness of breath.   The history is provided by the patient. No language interpreter was used.    Past Medical History  Diagnosis Date  . Bell's palsy 01/2009    Left sided  . Type II diabetes mellitus   . Hypertension   . Hyperlipidemia   . Allergic rhinitis   . Intolerance, drug     Leg cramps on Lipitor  . GERD (gastroesophageal reflux disease)   . Uterine fibroid   . Chest pain     Exercise stress test negative 6/07  . Postmenopausal symptoms     Hot flashes, vaginal dryness, iritability, difficulty sleeping. as of 2005.  Marland Kitchen Insomnia   . External hemorrhoid   . Lipoma 12/11    Posterior neck.   . Osteoarthritis     Carpometacarpal joint of right thumb  . Trigger finger     Right thumb  . Sebaceous cyst of breast     Has been refered to derm.  . Adenomatous colon polyp 6/09    Resected on colonoscopy, no high grade dysplasia.   . Hypertensive retinopathy     Followed by Dr. Herbert Deaner.  . Diabetic peripheral neuropathy   . Postmenopausal bleeding 9/05    Endometrial biopsy showed  FOCAL TUBAL METAPLASIA   . Colon polyps     03/22/2008: adenomatous polyps x3 w no dysplasia. Needs repeat colonoscopy in 3 years   Past  Surgical History  Procedure Laterality Date  . Tubal ligation     Family History  Problem Relation Age of Onset  . Pneumonia Mother   . Diabetes Mother   . Early death Father   . Diabetes Sister   . Diabetes Brother   . Diabetes Maternal Grandmother    History  Substance Use Topics  . Smoking status: Current Every Day Smoker -- 0.30 packs/day for 30 years    Types: Cigarettes  . Smokeless tobacco: Former Systems developer    Quit date: 11/28/2010     Comment: trying to quit .  Doing a little less now.  . Alcohol Use: No   OB History   Grav Para Term Preterm Abortions TAB SAB Ect Mult Living                 Review of Systems  Constitutional: Negative for fever and chills.  Respiratory: Negative for shortness of breath.   Cardiovascular: Negative for chest pain.  Gastrointestinal: Negative for nausea, vomiting and abdominal pain.  Endocrine: Negative for polyuria.  Genitourinary: Positive for dysuria. Negative for frequency, vaginal discharge and vaginal pain.  All other systems reviewed and are negative.    Allergies  Codeine and Hydrocodone-acetaminophen  Home Medications   Current Outpatient Rx  Name  Route  Sig  Dispense  Refill  . ACCU-CHEK SMARTVIEW test strip  CHECK BLOOD SUGAR AS INSTRUCTED TWICE DAILY   1 each   5   . acetaminophen (TYLENOL) 500 MG tablet   Oral   Take 2 tablets (1,000 mg total) by mouth every 6 (six) hours as needed for headache.   30 tablet   1   . aspirin 81 MG EC tablet   Oral   Take 81 mg by mouth daily.           Marland Kitchen atenolol-chlorthalidone (TENORETIC) 50-25 MG per tablet   Oral   Take 1 tablet by mouth daily.   90 tablet   4   . atorvastatin (LIPITOR) 40 MG tablet   Oral   Take 1 tablet (40 mg total) by mouth daily.   30 tablet   11   . Blood Glucose Monitoring Suppl (ACCU-CHEK NANO SMARTVIEW) W/DEVICE KIT   Does not apply   1 each by Does not apply route 2 (two) times daily. Dx code 250.00   1 kit   0   . cetirizine  (ZYRTEC ALLERGY) 10 MG tablet   Oral   Take 1 tablet (10 mg total) by mouth daily.   30 tablet   3   . diphenhydrAMINE (BENADRYL) 25 MG tablet   Oral   Take 25 mg by mouth every 6 (six) hours as needed for allergies.         . fluconazole (DIFLUCAN) 150 MG tablet   Oral   Take 1 tablet (150 mg total) by mouth once.   2 tablet   0   . fluconazole (DIFLUCAN) 150 MG tablet   Oral   Take 1 tablet (150 mg total) by mouth once.   1 tablet   12   . fluticasone (FLONASE) 50 MCG/ACT nasal spray   Each Nare   Place 1 spray into both nostrils daily.   16 g   2   . gabapentin (NEURONTIN) 300 MG capsule   Oral   Take 1 capsule (300 mg total) by mouth 2 (two) times daily as needed.   60 capsule   0   . ibuprofen (ADVIL,MOTRIN) 800 MG tablet   Oral   Take 1 tablet (800 mg total) by mouth every 8 (eight) hours as needed for pain.   30 tablet   0   . losartan (COZAAR) 100 MG tablet   Oral   Take 1 tablet (100 mg total) by mouth daily.   30 tablet   5   . metFORMIN (GLUMETZA) 1000 MG (MOD) 24 hr tablet   Oral   Take 1 tablet (1,000 mg total) by mouth daily with breakfast.   60 tablet   5   . metroNIDAZOLE (FLAGYL) 500 MG tablet   Oral   Take 1 tablet (500 mg total) by mouth 2 (two) times daily.   14 tablet   0   . promethazine (PHENERGAN) 12.5 MG tablet   Oral   Take 1 tablet (12.5 mg total) by mouth every 6 (six) hours as needed for nausea or vomiting.   30 tablet   0   . terconazole (TERAZOL 7) 0.4 % vaginal cream   Vaginal   Place 1 applicator vaginally at bedtime.   45 g   0   . zolpidem (AMBIEN CR) 12.5 MG CR tablet   Oral   Take 1 tablet (12.5 mg total) by mouth at bedtime as needed. For sleep   30 tablet   0    BP 161/88  Pulse 66  Temp(Src)  98 F (36.7 C) (Oral)  Resp 16  SpO2 97% Physical Exam  Nursing note and vitals reviewed. Constitutional: She is oriented to person, place, and time. She appears well-developed and well-nourished. No  distress.  HENT:  Head: Normocephalic and atraumatic.  Right Ear: External ear normal.  Left Ear: External ear normal.  Nose: Nose normal.  Mouth/Throat: Oropharynx is clear and moist.  Eyes: Conjunctivae are normal.  Neck: Normal range of motion.  Cardiovascular: Normal rate, regular rhythm and normal heart sounds.   Pulmonary/Chest: Effort normal and breath sounds normal. No stridor. No respiratory distress. She has no wheezes. She has no rales.  Abdominal: Soft. She exhibits no distension.  Musculoskeletal: Normal range of motion.  Neurological: She is alert and oriented to person, place, and time. She has normal strength.  Skin: Skin is warm and dry. She is not diaphoretic. No erythema.  Psychiatric: She has a normal mood and affect. Her behavior is normal.    ED Course  Procedures (including critical care time) Labs Review Labs Reviewed  GLUCOSE, CAPILLARY - Abnormal; Notable for the following:    Glucose-Capillary 329 (*)    All other components within normal limits  URINALYSIS, ROUTINE W REFLEX MICROSCOPIC - Abnormal; Notable for the following:    Color, Urine STRAW (*)    Glucose, UA >1000 (*)    All other components within normal limits  CBC WITH DIFFERENTIAL - Abnormal; Notable for the following:    Hemoglobin 15.5 (*)    MCHC 36.4 (*)    All other components within normal limits  BASIC METABOLIC PANEL - Abnormal; Notable for the following:    Sodium 133 (*)    Chloride 92 (*)    Glucose, Bld 331 (*)    All other components within normal limits  URINE MICROSCOPIC-ADD ON   Imaging Review No results found.  EKG Interpretation   None       MDM   1. Hyperglycemia   2. Diabetes   3. DIABETES MELLITUS, TYPE II    Patient presents to ED for hyperglycemia. She has had high CBG readings since December. Vaginitis cleared with abx at that time. New new meds. No recent illness. Pt with dysuria, but no other sx. Labs show elevated blood glucose, but patient does  not appear acidotic. Patient was given PO fluids in the ED. Follow up with PCP for discussion about diabetes medications. Reasons to return to the emergency department immediately were given. Vital signs stable for discharge. I discussed this case with Dr. Dorna Mai who agrees with plan. Patient / Family / Caregiver informed of clinical course, understand medical decision-making process, and agree with plan.     Elwyn Lade, PA-C 10/24/13 2357

## 2013-10-25 NOTE — ED Provider Notes (Signed)
Medical screening examination/treatment/procedure(s) were performed by non-physician practitioner and as supervising physician I was immediately available for consultation/collaboration.  EKG Interpretation   None         Saddie Benders. Dorna Mai, MD 10/25/13 US:[id number]

## 2013-10-27 NOTE — Telephone Encounter (Signed)
Pt seen in ED on 1/17. Has f/u appointment in clinic on 1/22

## 2013-10-29 ENCOUNTER — Encounter: Payer: Self-pay | Admitting: Internal Medicine

## 2013-10-29 ENCOUNTER — Ambulatory Visit (INDEPENDENT_AMBULATORY_CARE_PROVIDER_SITE_OTHER): Payer: Medicaid Other | Admitting: Internal Medicine

## 2013-10-29 VITALS — BP 133/77 | HR 62 | Temp 97.1°F | Ht 66.0 in | Wt 174.2 lb

## 2013-10-29 DIAGNOSIS — E785 Hyperlipidemia, unspecified: Secondary | ICD-10-CM

## 2013-10-29 DIAGNOSIS — E1159 Type 2 diabetes mellitus with other circulatory complications: Secondary | ICD-10-CM

## 2013-10-29 DIAGNOSIS — I10 Essential (primary) hypertension: Secondary | ICD-10-CM

## 2013-10-29 LAB — GLUCOSE, CAPILLARY: Glucose-Capillary: 264 mg/dL — ABNORMAL HIGH (ref 70–99)

## 2013-10-29 LAB — POCT GLYCOSYLATED HEMOGLOBIN (HGB A1C): Hemoglobin A1C: 11.6

## 2013-10-29 MED ORDER — LOSARTAN POTASSIUM 100 MG PO TABS: 100.0000 mg | ORAL_TABLET | Freq: Every day | ORAL | Status: DC

## 2013-10-29 MED ORDER — ATENOLOL-CHLORTHALIDONE 50-25 MG PO TABS: 1.0000 | ORAL_TABLET | Freq: Every day | ORAL | Status: DC

## 2013-10-29 MED ORDER — METFORMIN HCL ER (MOD) 1000 MG PO TB24: 2000.0000 mg | ORAL_TABLET | Freq: Every day | ORAL | Status: DC

## 2013-10-29 MED ORDER — GLUCOSE BLOOD VI STRP: ORAL_STRIP | Status: DC

## 2013-10-29 MED ORDER — ATORVASTATIN CALCIUM 40 MG PO TABS: 40.0000 mg | ORAL_TABLET | Freq: Every day | ORAL | Status: DC

## 2013-10-29 NOTE — Patient Instructions (Signed)
Thank you for your visit today. Please return to the internal medicine clinic for your regularly scheduled appointment with Dr. Ronnald Ramp.      We have increased your metformin (glumetza) to 2 pills at breakfast for a total of 2000mg  daily.  Please continue your other medications as prescribed.  If you believe that you are suffering from a life threatening condition or one that may result in the loss of limb or function, then you should call 911 or proceed to the nearest Emergency Department.

## 2013-10-29 NOTE — Progress Notes (Signed)
Subjective:   Patient ID: Bridget Owens female    DOB: 04/03/1956 58 y.o.    MRN: [id number]  ____________________________________  HPI: Bridget Owens is a 58 y.o. female here for an ED follow up.  Pt went to the ED for hyperglycemia.  Pt has a PMH outlined below.  Please see problem-based assessment and plan for further details of medical issues addressed at today's visit.   PMH: Past Medical History  Diagnosis Date  . Bell's palsy 01/2009    Left sided  . Type II diabetes mellitus   . Hypertension   . Hyperlipidemia   . Allergic rhinitis   . Intolerance, drug     Leg cramps on Lipitor  . GERD (gastroesophageal reflux disease)   . Uterine fibroid   . Chest pain     Exercise stress test negative 6/07  . Postmenopausal symptoms     Hot flashes, vaginal dryness, iritability, difficulty sleeping. as of 2005.  Marland Kitchen Insomnia   . External hemorrhoid   . Lipoma 12/11    Posterior neck.   . Osteoarthritis     Carpometacarpal joint of right thumb  . Trigger finger     Right thumb  . Sebaceous cyst of breast     Has been refered to derm.  . Adenomatous colon polyp 6/09    Resected on colonoscopy, no high grade dysplasia.   . Hypertensive retinopathy     Followed by Dr. Herbert Deaner.  . Diabetic peripheral neuropathy   . Postmenopausal bleeding 9/05    Endometrial biopsy showed  FOCAL TUBAL METAPLASIA   . Colon polyps     03/22/2008: adenomatous polyps x3 w no dysplasia. Needs repeat colonoscopy in 3 years    Medications:  (Not in a hospital admission)  Allergies: Allergies  Allergen Reactions  . Codeine Other (See Comments)    "felt funny"    FH: Family History  Problem Relation Age of Onset  . Pneumonia Mother   . Diabetes Mother   . Early death Father   . Diabetes Sister   . Diabetes Brother   . Diabetes Maternal Grandmother     SH: History   Social History  . Marital Status: Single    Spouse Name: N/A    Number of Children: N/A  . Years of Education:  N/A   Social History Main Topics  . Smoking status: Current Every Day Smoker -- 0.30 packs/day for 30 years    Types: Cigarettes  . Smokeless tobacco: Former Systems developer    Quit date: 11/28/2010     Comment: trying to quit .  Doing a little less now.  . Alcohol Use: No  . Drug Use: No  . Sexual Activity: Not on file   Other Topics Concern  . Not on file   Social History Narrative  . No narrative on file    Review of Systems: Constitutional: Denies fever/chills, unintentional weight loss, appetite change or fatigue.  Respiratory: Denies SOB (at rest or with exertion), cough, chest tightness, and wheezing.   Cardiovascular: Denies CP, palpitations and leg swelling.  Gastrointestinal: Denies N/V/D/C, +abdominal pain, and blood/changes in stool. Genitourinary: Denies dysuria, urgency, frequency, hematuria, and difficulty urinating.  Endocrine: Denies: hot/cold intolerance, polyuria, +polyphagia, polydipsia. Skin: Denies rashes or wounds.  Neurological: Denies dizziness/light-headedness, syncope, numbness and headaches.  Hematological: Denies adenopathy or easy bruising. Psychiatric/Behavioral: Denies mood changes, sleep disturbance.   Objective:   Vital Signs: There were no vitals filed for this visit.    BP  Readings from Last 3 Encounters:  10/24/13 154/72  10/06/13 142/74  08/21/13 176/86    Physical Exam: Constitutional: Vital signs reviewed.  Patient is well-developed and well-nourished in NAD and cooperative with exam.  Head: Normocephalic and atraumatic. Eyes: PERRL, EOMI, conjunctivae nl, no scleral icterus.  Neck: Supple, Trachea midline, no JVD appreciated. Cardiovascular: RRR, no MRG, pulses symmetric and intact b/l. Pulmonary/Chest: normal respiratory effort, non-tender to palpation, CTAB, no wheezes, rales, or rhonchi. Abdominal: Obese. Soft.  Mild suprapubic tenderness. +BS. Neurological: A&O x3, cranial nerves II-XII are grossly intact, moving all  extremities. Extremities: 2+DP b/l; no pitting edema. Skin: Warm, dry and intact. No rash, cyanosis, or clubbing.  Psychiatric: Normal mood.   Most Recent Laboratory Results:  CMP     Component Value Date/Time   NA 133* 10/24/2013 2003   K 3.8 10/24/2013 2003   CL 92* 10/24/2013 2003   CO2 23 10/24/2013 2003   GLUCOSE 331* 10/24/2013 2003   BUN 15 10/24/2013 2003   CREATININE 0.57 10/24/2013 2003   CREATININE 0.62 11/25/2012 0959   CALCIUM 9.8 10/24/2013 2003   PROT 7.8 11/25/2012 0959   ALBUMIN 4.1 11/25/2012 0959   AST 15 11/25/2012 0959   ALT 16 11/25/2012 0959   ALKPHOS 71 11/25/2012 0959   BILITOT 0.2* 11/25/2012 0959   GFRNONAA >90 10/24/2013 2003   GFRAA >90 10/24/2013 2003    CBC    Component Value Date/Time   WBC 7.6 10/24/2013 2003   RBC 4.96 10/24/2013 2003   HGB 15.5* 10/24/2013 2003   HCT 42.6 10/24/2013 2003   PLT 301 10/24/2013 2003   MCV 85.9 10/24/2013 2003   MCH 31.3 10/24/2013 2003   MCHC 36.4* 10/24/2013 2003   RDW 12.4 10/24/2013 2003   LYMPHSABS 2.7 10/24/2013 2003   MONOABS 0.4 10/24/2013 2003   EOSABS 0.1 10/24/2013 2003   BASOSABS 0.1 10/24/2013 2003    Lipid Panel Lab Results  Component Value Date   CHOL 157 12/10/2012   HDL 41 12/10/2012   LDLCALC 99 12/10/2012   TRIG 86 12/10/2012   CHOLHDL 3.8 12/10/2012    HA1C Lab Results  Component Value Date   HGBA1C 5.8 07/03/2013    Urinalysis    Component Value Date/Time   COLORURINE STRAW* 10/24/2013 2055   APPEARANCEUR CLEAR 10/24/2013 2055   LABSPEC 1.008 10/24/2013 2055   PHURINE 6.0 10/24/2013 2055   GLUCOSEU >1000* 10/24/2013 2055   HGBUR NEGATIVE 10/24/2013 2055   HGBUR trace-intact 10/29/2007 0933   BILIRUBINUR NEGATIVE 10/24/2013 2055   KETONESUR NEGATIVE 10/24/2013 2055   PROTEINUR NEGATIVE 10/24/2013 2055   UROBILINOGEN 0.2 10/24/2013 2055   NITRITE NEGATIVE 10/24/2013 2055   LEUKOCYTESUR NEGATIVE 10/24/2013 2055    Urine Microalbumin Lab Results  Component Value Date   MICROALBUR 0.50 07/03/2013     Imaging N/A  Assessment & Plan:   Assessment and plan was discussed and formulated with my attending.

## 2013-10-29 NOTE — Assessment & Plan Note (Addendum)
Pt returns to clinic today for an ED f/u after having some blood sugars in the 300-400's.  She reports compliance with her metformin (1000mg  qd) and denies missing any doses.  She has been eating more sweets recently and made a cake over the holidays which she has consumed.  She denies any URI symptoms, fever/chills, HA, N/V/D/C, CP, SOB, polyuria, polydipsia, or sick contacts.  She reports polyphagia.  She was treated for a vaginitis recently with fluconazole in the ED but says that has resolved.  On exam, VS are stable, mucous membranes are moist, lungs are CTAB.  She has mild suprapubic tenderness but UA in the ED showed no signs of infection.  Today, HA1c was 11.6.   -increase metformin to 2000mg  daily -continue other medications as prescribed -discussed need to decrease intake of sweets -reminded to bring medications to appointments  Lab Results  Component Value Date   HGBA1C 11.6 10/29/2013   HGBA1C 5.8 07/03/2013   HGBA1C 8.6 03/09/2013

## 2013-10-30 NOTE — Progress Notes (Signed)
Case discussed with Dr. Gordy Levan at the time of the visit.  We reviewed the resident's history and exam and pertinent patient test results.  I agree with the assessment, diagnosis, and plan of care documented in the resident's note. Despite the A1C of 11.6, we decided to increase metformin rather than start insulin bc her prior A1C was 5.8, indicating that this episode might be primarily dietary indiscretion. She has been counseled on diet and if A1C not appropriate next appt, then isnulin might need to be discussed.

## 2013-11-04 ENCOUNTER — Encounter: Payer: Self-pay | Admitting: Internal Medicine

## 2013-11-04 ENCOUNTER — Ambulatory Visit (INDEPENDENT_AMBULATORY_CARE_PROVIDER_SITE_OTHER): Payer: Medicaid Other | Admitting: Internal Medicine

## 2013-11-04 VITALS — BP 132/80 | HR 65 | Temp 97.0°F | Wt 175.8 lb

## 2013-11-04 DIAGNOSIS — R300 Dysuria: Secondary | ICD-10-CM

## 2013-11-04 DIAGNOSIS — B9689 Other specified bacterial agents as the cause of diseases classified elsewhere: Secondary | ICD-10-CM

## 2013-11-04 DIAGNOSIS — N760 Acute vaginitis: Secondary | ICD-10-CM

## 2013-11-04 DIAGNOSIS — I798 Other disorders of arteries, arterioles and capillaries in diseases classified elsewhere: Secondary | ICD-10-CM

## 2013-11-04 DIAGNOSIS — A499 Bacterial infection, unspecified: Secondary | ICD-10-CM

## 2013-11-04 DIAGNOSIS — E1159 Type 2 diabetes mellitus with other circulatory complications: Secondary | ICD-10-CM

## 2013-11-04 LAB — POCT URINALYSIS DIPSTICK
Bilirubin, UA: NEGATIVE
Ketones, UA: NEGATIVE
Leukocytes, UA: NEGATIVE
Nitrite, UA: NEGATIVE
Protein, UA: NEGATIVE
Spec Grav, UA: 1.015
Urobilinogen, UA: 0.2
pH, UA: 7

## 2013-11-04 LAB — GLUCOSE, CAPILLARY: Glucose-Capillary: 275 mg/dL — ABNORMAL HIGH (ref 70–99)

## 2013-11-04 MED ORDER — METRONIDAZOLE 500 MG PO TABS: 500.0000 mg | ORAL_TABLET | Freq: Two times a day (BID) | ORAL | Status: DC

## 2013-11-04 NOTE — Progress Notes (Signed)
Patient ID: Bridget Owens, female   DOB: 1956-09-17, 58 y.o.   MRN: [id number]  Subjective:   Patient ID: Bridget Owens female   DOB: October 08, 1956 58 y.o.   MRN: [id number]  HPI: [name] is a 58 y.o. F w/ PMH DM2 and multiple other comorbidities presents today for presistent hyperglycemia.  She was seen on 1/22 for an ED f/u for hyperglycemia w/ CBGs 300-400, and at that time she endorses eating more sweets. Her A1c was 11.6 and her Metformin was increased to 204m daily.  Since that time, her CBGs have improved but have remained elevated in the 200s. She states that she has been avoiding sweets but will eat potato salad and rice and occasionally has grits and mac and cheese. She also drinks her coffee with sugar every day.   She denies abdominal pain, N/V, or polyuria.   She also states that she is having burning on urination that has been present since she was last seen in the ED on 12/30. She was treated with Flagyl at that time for wet prep + for BV. Per pt, after taking the Flagyl, her sx improved but then returned.    Past Medical History  Diagnosis Date  . Bell's palsy 01/2009    Left sided  . Type II diabetes mellitus   . Hypertension   . Hyperlipidemia   . Allergic rhinitis   . Intolerance, drug     Leg cramps on Lipitor  . GERD (gastroesophageal reflux disease)   . Uterine fibroid   . Chest pain     Exercise stress test negative 6/07  . Postmenopausal symptoms     Hot flashes, vaginal dryness, iritability, difficulty sleeping. as of 2005.  .Marland KitchenInsomnia   . External hemorrhoid   . Lipoma 12/11    Posterior neck.   . Osteoarthritis     Carpometacarpal joint of right thumb  . Trigger finger     Right thumb  . Sebaceous cyst of breast     Has been refered to derm.  . Adenomatous colon polyp 6/09    Resected on colonoscopy, no high grade dysplasia.   . Hypertensive retinopathy     Followed by Dr. HHerbert Deaner  . Diabetic peripheral neuropathy   . Postmenopausal bleeding  9/05    Endometrial biopsy showed  FOCAL TUBAL METAPLASIA   . Colon polyps     03/22/2008: adenomatous polyps x3 w no dysplasia. Needs repeat colonoscopy in 3 years   Current Outpatient Prescriptions  Medication Sig Dispense Refill  . aspirin 81 MG EC tablet Take 81 mg by mouth daily.        .Marland Kitchenatenolol-chlorthalidone (TENORETIC) 50-25 MG per tablet Take 1 tablet by mouth daily.  90 tablet  4  . atorvastatin (LIPITOR) 40 MG tablet Take 1 tablet (40 mg total) by mouth daily.  30 tablet  11  . Blood Glucose Monitoring Suppl (ACCU-CHEK NANO SMARTVIEW) W/DEVICE KIT 1 each by Does not apply route 2 (two) times daily. Dx code 250.00  1 kit  0  . glucose blood (ACCU-CHEK SMARTVIEW) test strip CHECK BLOOD SUGAR AS INSTRUCTED TWICE DAILY  1 each  5  . losartan (COZAAR) 100 MG tablet Take 1 tablet (100 mg total) by mouth daily.  30 tablet  5  . metFORMIN (GLUMETZA) 1000 MG (MOD) 24 hr tablet Take 2 tablets (2,000 mg total) by mouth daily with breakfast.  60 tablet  5  . terconazole (TERAZOL 7) 0.4 % vaginal cream  Place 1 applicator vaginally at bedtime as needed (itching/burning).      . diphenhydrAMINE (BENADRYL) 12.5 MG/5ML liquid Take by mouth at bedtime as needed (coughing/sneezing). 1/2 capful      . metroNIDAZOLE (FLAGYL) 500 MG tablet Take 1 tablet (500 mg total) by mouth 2 (two) times daily.  14 tablet  0  . [DISCONTINUED] pantoprazole (PROTONIX) 20 MG tablet Take 1 tablet (20 mg total) by mouth daily.  30 tablet  0   No current facility-administered medications for this visit.   Family History  Problem Relation Age of Onset  . Pneumonia Mother   . Diabetes Mother   . Early death Father   . Diabetes Sister   . Diabetes Brother   . Diabetes Maternal Grandmother    History   Social History  . Marital Status: Single    Spouse Name: N/A    Number of Children: N/A  . Years of Education: N/A   Social History Main Topics  . Smoking status: Current Every Day Smoker -- 0.30 packs/day for  30 years    Types: Cigarettes  . Smokeless tobacco: Former Systems developer    Quit date: 11/28/2010     Comment: trying to quit .  Doing a little less now.  . Alcohol Use: No  . Drug Use: No  . Sexual Activity: None   Other Topics Concern  . None   Social History Narrative  . None   Review of Systems: Constitutional: Denies fever, chills, appetite change, or fatigue.  HEENT: Denies neck pain   Respiratory: Denies SOB, DOE Cardiovascular: Denies chest pain Gastrointestinal: Denies nausea, vomiting, abdominal pain, diarrhea, constipation Genitourinary: + dysuria. Denies urgency, frequency, hematuria. She denies vaginal discharge or odor. Endocrine: Denies polyuria, polydipsia. Psychiatric/Behavioral: Denies mood changes A 12 point ROS was performed; pertinent positives and negatives are noted   Objective:  Physical Exam: Filed Vitals:   11/04/13 0928 11/04/13 1008  BP: 143/80 132/80  Pulse: 75 65  Temp: 97 F (36.1 C)   TempSrc: Oral   Weight: 175 lb 12.8 oz (79.742 kg)   SpO2: 98%    Constitutional: Vital signs reviewed.  Patient is a well-developed and well-nourished female in no acute distress and cooperative with exam.  Head: Normocephalic and atraumatic Eyes: PERRL, EOMI  Neck: Trachea midline  Cardiovascular: RRR, no MRG Pulmonary/Chest: normal respiratory effort, CTAB, no wheezes, rales, or rhonchi Abdominal: Soft. Non-tender, non-distended Musculoskeletal: Moves all 4 extremities. No deformities.  Neurological: A&O x3, no focal neurological deficits Skin: Warm, dry and intact.   Psychiatric: Normal mood and affect. Speech and behavior is normal.   Assessment & Plan:   Please refer to Problem List based Assessment and Plan

## 2013-11-04 NOTE — Patient Instructions (Addendum)
Take the metronidazole 2 times a day for the next 7 days. If the burning does not resolve, please call the clinic.   DO NOT DRINK ALCOHOL WITH THIS MEDICATION!!!  Keep working on your diet, and try to avoid the potato salad and the rice. Try to cut out sugar from your coffee. If you blood sugars remain elevated, please call the clinic to be seen and bring your meter with you.   Metronidazole tablets or capsules What is this medicine? METRONIDAZOLE (me troe NI da zole) is an antiinfective. It is used to treat certain kinds of bacterial and protozoal infections. It will not work for colds, flu, or other viral infections. This medicine may be used for other purposes; ask your health care provider or pharmacist if you have questions. COMMON BRAND NAME(S): Flagyl What should I tell my health care provider before I take this medicine? They need to know if you have any of these conditions: -anemia or other blood disorders -disease of the nervous system -fungal or yeast infection -if you drink alcohol containing drinks -liver disease -seizures -an unusual or allergic reaction to metronidazole, or other medicines, foods, dyes, or preservatives -pregnant or trying to get pregnant -breast-feeding How should I use this medicine? Take this medicine by mouth with a full glass of water. Follow the directions on the prescription label. Take your medicine at regular intervals. Do not take your medicine more often than directed. Take all of your medicine as directed even if you think you are better. Do not skip doses or stop your medicine early. Talk to your pediatrician regarding the use of this medicine in children. Special care may be needed. Overdosage: If you think you have taken too much of this medicine contact a poison control center or emergency room at once. NOTE: This medicine is only for you. Do not share this medicine with others. What if I miss a dose? If you miss a dose, take it as soon as  you can. If it is almost time for your next dose, take only that dose. Do not take double or extra doses. What may interact with this medicine? Do not take this medicine with any of the following medications: -alcohol or any product that contains alcohol -amprenavir oral solution -cisapride -disulfiram -dofetilide -dronedarone -paclitaxel injection -pimozide -ritonavir oral solution -sertraline oral solution -sulfamethoxazole-trimethoprim injection -thioridazine -ziprasidone This medicine may also interact with the following medications: -cimetidine -lithium -other medicines that prolong the QT interval (cause an abnormal heart rhythm) -phenobarbital -phenytoin -warfarin This list may not describe all possible interactions. Give your health care provider a list of all the medicines, herbs, non-prescription drugs, or dietary supplements you use. Also tell them if you smoke, drink alcohol, or use illegal drugs. Some items may interact with your medicine. What should I watch for while using this medicine? Tell your doctor or health care professional if your symptoms do not improve or if they get worse. You may get drowsy or dizzy. Do not drive, use machinery, or do anything that needs mental alertness until you know how this medicine affects you. Do not stand or sit up quickly, especially if you are an older patient. This reduces the risk of dizzy or fainting spells. Avoid alcoholic drinks while you are taking this medicine and for three days afterward. Alcohol may make you feel dizzy, sick, or flushed. If you are being treated for a sexually transmitted disease, avoid sexual contact until you have finished your treatment. Your sexual partner may also  need treatment. What side effects may I notice from receiving this medicine? Side effects that you should report to your doctor or health care professional as soon as possible: -allergic reactions like skin rash or hives, swelling of the  face, lips, or tongue -confusion, clumsiness -difficulty speaking -discolored or sore mouth -dizziness -fever, infection -numbness, tingling, pain or weakness in the hands or feet -trouble passing urine or change in the amount of urine -redness, blistering, peeling or loosening of the skin, including inside the mouth -seizures -unusually weak or tired -vaginal irritation, dryness, or discharge Side effects that usually do not require medical attention (report to your doctor or health care professional if they continue or are bothersome): -diarrhea -headache -irritability -metallic taste -nausea -stomach pain or cramps -trouble sleeping This list may not describe all possible side effects. Call your doctor for medical advice about side effects. You may report side effects to FDA at 1-800-FDA-1088. Where should I keep my medicine? Keep out of the reach of children. Store at room temperature below 25 degrees C (77 degrees F). Protect from light. Keep container tightly closed. Throw away any unused medicine after the expiration date. NOTE: This sheet is a summary. It may not cover all possible information. If you have questions about this medicine, talk to your doctor, pharmacist, or health care provider.  2014, [employer] Standard. (2013-03-24 14:08:26)

## 2013-11-04 NOTE — Assessment & Plan Note (Addendum)
Lab Results  Component Value Date   HGBA1C 11.6 10/29/2013   HGBA1C 5.8 07/03/2013   HGBA1C 8.6 03/09/2013     Assessment: Diabetes control: poor control (HgbA1C >9%) Progress toward A1C goal:  improved Comments: Her blood sugars are improved from her last visit, and are now in the 200s, down from 300-400 at her last visit after increasing the metformin to 2000mg  daily. She is decreasing sweets at she's eating, but she still eating carbohydrate rich foods including potato and rice and some grits and macaroni and cheese.  Plan: Medications:  continue current medications Home glucose monitoring: Frequency:  intermittently, at least once a day Timing:   Instruction/counseling given: discussed diet Other plans: She is to return to the clinic in 3 months for an A1c check. If her blood sugars remain elevated consider referring to diabetes educator/nutritionist in addition to changing medical management.

## 2013-11-04 NOTE — Assessment & Plan Note (Addendum)
Pt seen in the ED on 12/30 and wet prep +BV. She was treated with a metronidazole x7 days which she states was completed. 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 She states that the symptoms have been occurring since before she was seen in the ED at the end of December. She states that the metronidazole didn't resolve her symptoms temporarily, but they have returned. Urine dipstick was without nitrites or leukocytes, but checking culture to rule out urinary tract infection. She likely has recurrence of her bacterial vaginosis. Will repeat a course of the metronidazole hopefully this will take care of her symptoms.  - Metronidazole 500 mg twice a day x7 days  - She is to return to the clinic if her symptoms do not improve and will need a pelvic exam  - Checking UA with culture

## 2013-11-05 LAB — URINE CULTURE: Colony Count: 30000

## 2013-11-05 NOTE — Progress Notes (Signed)
Case discussed with Dr. Glenn soon after the resident saw the patient.  We reviewed the resident's history and exam and pertinent patient test results.  I agree with the assessment, diagnosis, and plan of care documented in the resident's note. 

## 2014-01-04 IMAGING — CT CT NECK W/ CM
4 of 7 series · 13 of 33 positions shown, 15 images · IV contrast (CONTRAST)
Comparison: Head CT without contrast 05/10/2004.  Cervical spine
radiographs 07/28/2010.

CT HEAD

CLINICAL DATA: 56-year-old female with right facial droop, right
ear and neck pain.

CT HEAD WITHOUT CONTRAST
CT NECK WITH CONTRAST
TECHNIQUE: Contiguous axial images were obtained from the base of
the skull through the vertex without contrast. Multidetector CT
imaging of the neck was performed using the standard protocol
without intravenous contrast.

[Series 5: soft tissue · axial · 0.49mm/px · z∈[+108,+218]mm · 3 of 111 slices shown]
[im 28/111  soft-tissue]
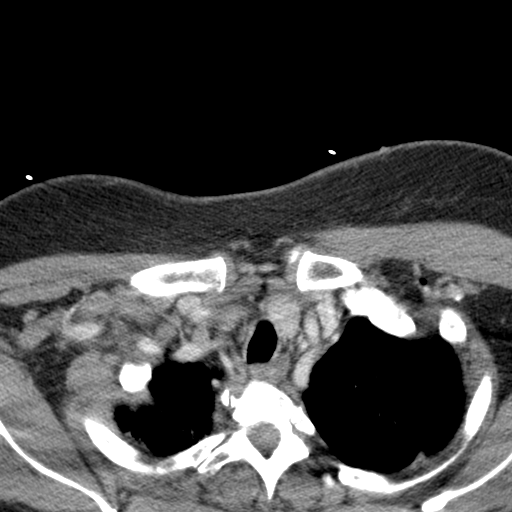
[im 56/111  soft-tissue]
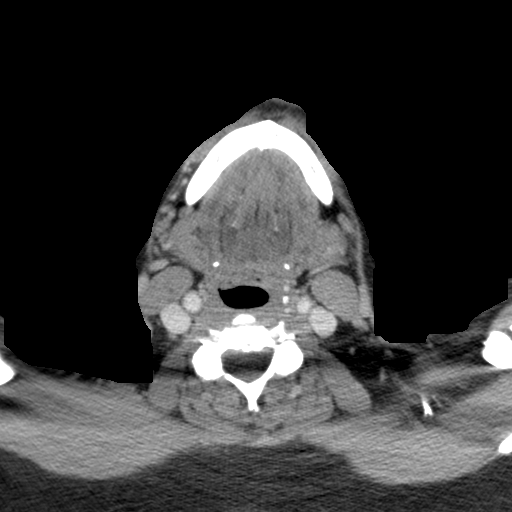
[im 83/111  soft-tissue]
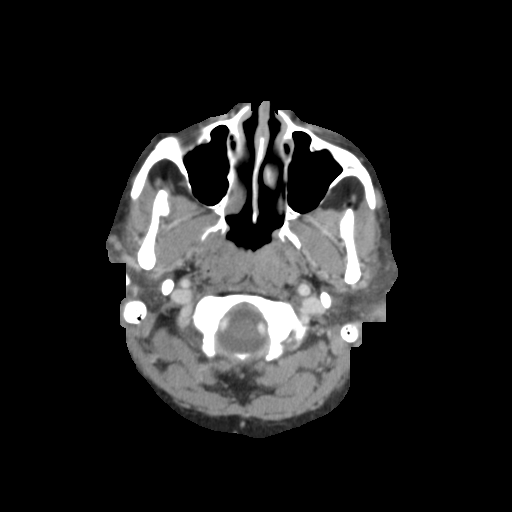

[axial · axial · 0.49mm/px · z∈[+94,+154]mm · 2 of 97 slices shown, 3 images]
[im 33/97  soft-tissue]
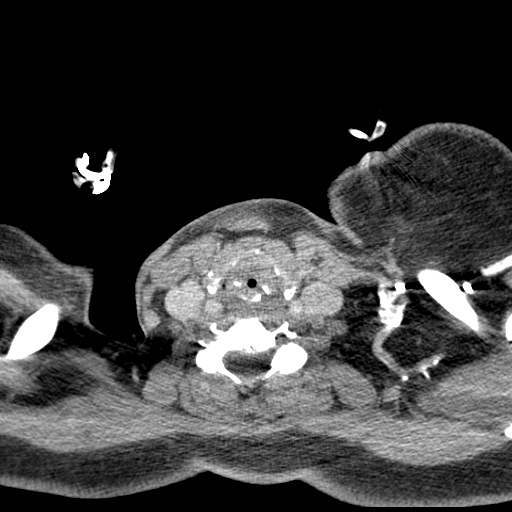
[im 33/97  bone]
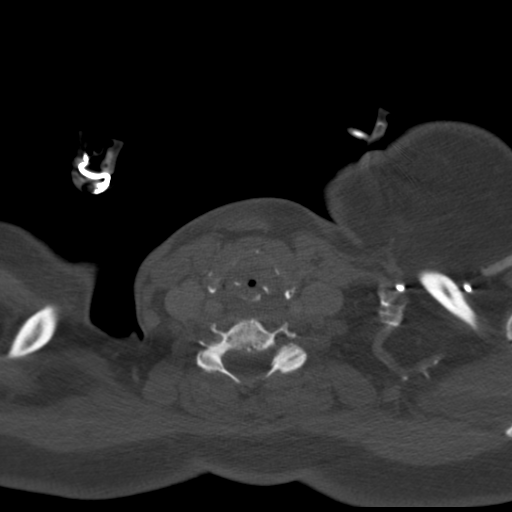
[im 65/97  bone]
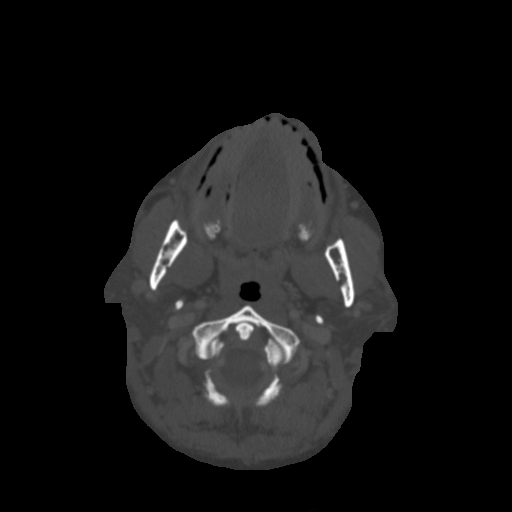

[cor · coronal · 0.49mm/px · 3 of 81 slices shown]
[im 17/81  bone]
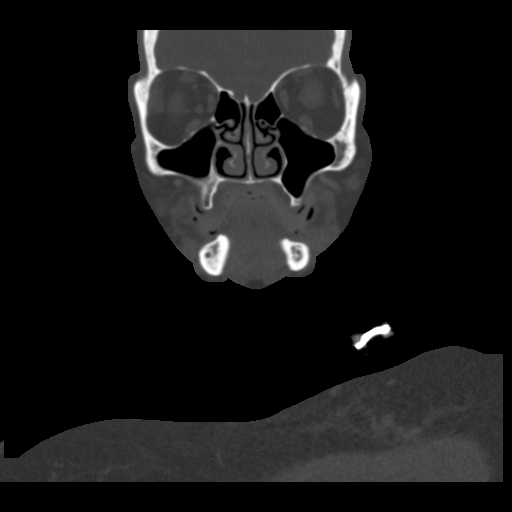
[im 33/81  bone]
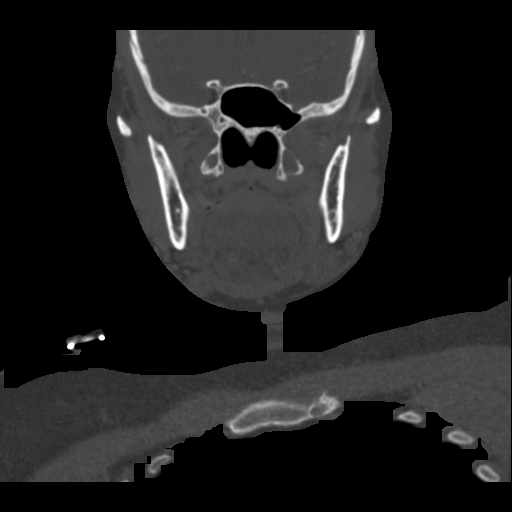
[im 49/81  bone]
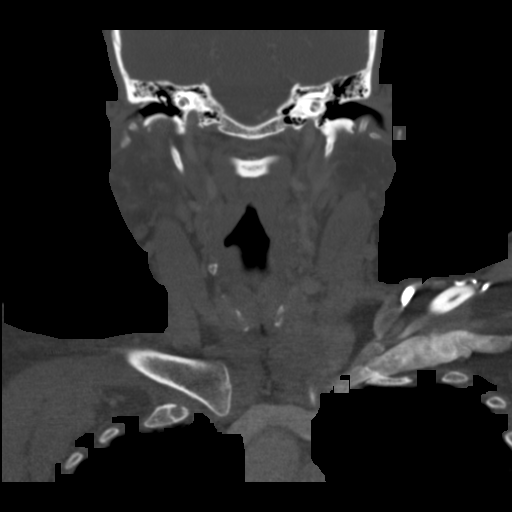

[sag · sagittal · 0.49mm/px · 5 of 80 slices shown, 6 images]
[im 27/80  bone]
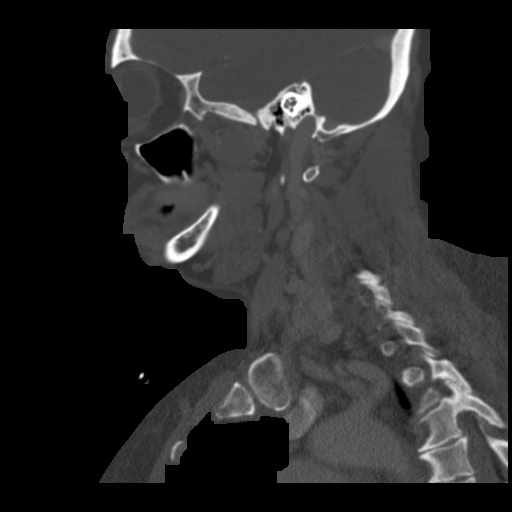
[im 33/80  bone]
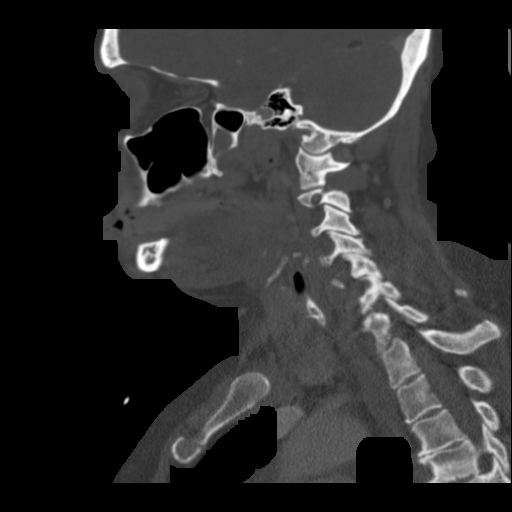
[im 40/80  soft-tissue]
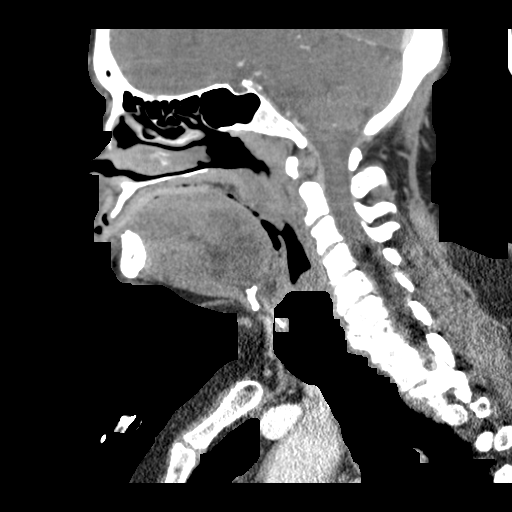
[im 40/80  bone]
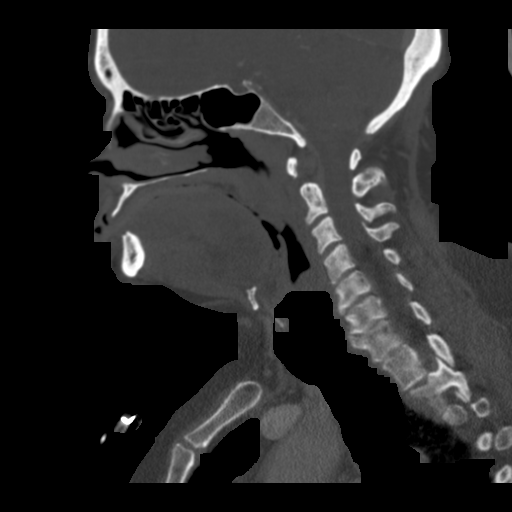
[im 47/80  bone]
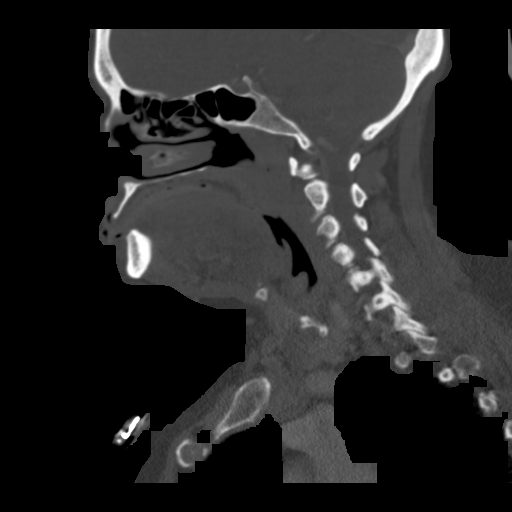
[im 53/80  bone]
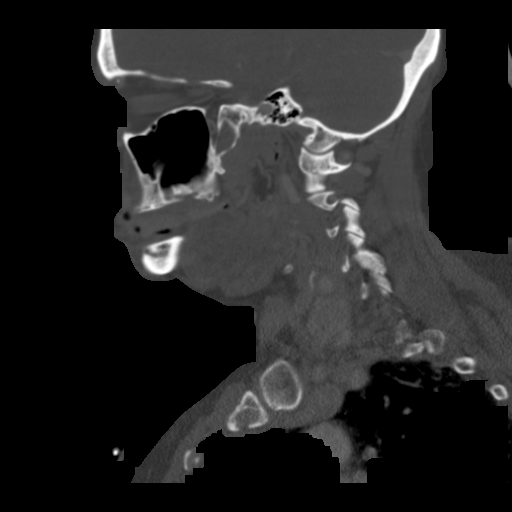

[13 of 33 positions shown; findings below may reference images not displayed]

FINDINGS: Visualized paranasal sinuses and mastoids are clear.
Visualized orbit soft tissues are within normal limits.  Visualized
scalp soft tissues are within normal limits.  Calvarium intact.

There is a small low density focus in the left posterior hemisphere
at or near the tentorium with densitometry of fat (-22 Hounsfield
units) measuring 6-7 mm in diameter today, 2 mm diameter 9 years
earlier.  No significant mass effect.  No surrounding edema.

No other intracranial mass lesion.  No ventriculomegaly.  Normal
cerebral volume. Gray-white matter differentiation is within normal
limits throughout the brain.  No evidence of cortically based acute
infarction identified.  No acute intracranial hemorrhage
identified.  No suspicious intracranial vascular hyperdensity.
IMPRESSION: 1.  Left posterior hemisphere low density lesion with modest growth
since 8884 seems most compatible with intracranial
dermoid/epidermoid. No associated mass effect or edema; favor this
is incidental and clinically silent. Nonemergent MRI without and
with contrast would confirm.
2.  Otherwise stable and negative noncontrast CT appearance of the
brain.
3.  Neck findings are below.
4.  Right middle ear and mastoids are clear.

CT NECK

Contrast:  80 ml [id number].
FINDINGS: Atelectasis and paraseptal emphysema in the visible
lungs.  No superior mediastinal lymphadenopathy.

Mild thyromegaly but no discrete thyroid lesion.  Larynx, pharynx,
parapharyngeal spaces, retropharyngeal space and sublingual space
are within normal limits.  Parotid and submandibular glands are
within normal limits.

No cervical lymphadenopathy; right level IIB nodes are
asymmetrically increased in number but remain within normal limits
by size criteria.  Small lymph nodes elsewhere appears symmetric.

Visualized major vascular structures are patent.

No acute osseous abnormality identified.  Degenerative changes in
the cervical spine.  Ankylosis of the right C4-C5 posterior
elements.  Facet and disc degeneration elsewhere.

On these images the left posterior hemisphere lesion does appear to
abut the tentorium.  No enhancement.
IMPRESSION: No soft tissue abnormality identified in the neck. Cervical spine
degeneration.  Paraseptal emphysema.

## 2014-01-08 ENCOUNTER — Emergency Department (HOSPITAL_COMMUNITY)
Admission: EM | Admit: 2014-01-08 | Discharge: 2014-01-08 | Disposition: A | Discharge status: Home / Self Care | Payer: Medicaid Other | Attending: Emergency Medicine | Admitting: Emergency Medicine

## 2014-01-08 ENCOUNTER — Encounter (HOSPITAL_COMMUNITY): Payer: Self-pay | Admitting: Emergency Medicine

## 2014-01-08 ENCOUNTER — Emergency Department (HOSPITAL_COMMUNITY): Payer: Medicaid Other

## 2014-01-08 DIAGNOSIS — Z79899 Other long term (current) drug therapy: Secondary | ICD-10-CM | POA: Insufficient documentation

## 2014-01-08 DIAGNOSIS — Z8719 Personal history of other diseases of the digestive system: Secondary | ICD-10-CM | POA: Insufficient documentation

## 2014-01-08 DIAGNOSIS — M79672 Pain in left foot: Secondary | ICD-10-CM

## 2014-01-08 DIAGNOSIS — Z860100 Personal history of colon polyps, unspecified: Secondary | ICD-10-CM | POA: Insufficient documentation

## 2014-01-08 DIAGNOSIS — G8929 Other chronic pain: Secondary | ICD-10-CM | POA: Insufficient documentation

## 2014-01-08 DIAGNOSIS — Z8742 Personal history of other diseases of the female genital tract: Secondary | ICD-10-CM | POA: Insufficient documentation

## 2014-01-08 DIAGNOSIS — M1990 Unspecified osteoarthritis, unspecified site: Secondary | ICD-10-CM | POA: Insufficient documentation

## 2014-01-08 DIAGNOSIS — E785 Hyperlipidemia, unspecified: Secondary | ICD-10-CM | POA: Insufficient documentation

## 2014-01-08 DIAGNOSIS — L02619 Cutaneous abscess of unspecified foot: Secondary | ICD-10-CM | POA: Insufficient documentation

## 2014-01-08 DIAGNOSIS — Z7982 Long term (current) use of aspirin: Secondary | ICD-10-CM | POA: Insufficient documentation

## 2014-01-08 DIAGNOSIS — F17200 Nicotine dependence, unspecified, uncomplicated: Secondary | ICD-10-CM | POA: Insufficient documentation

## 2014-01-08 DIAGNOSIS — L0390 Cellulitis, unspecified: Secondary | ICD-10-CM | POA: Diagnosis present

## 2014-01-08 DIAGNOSIS — Z8709 Personal history of other diseases of the respiratory system: Secondary | ICD-10-CM | POA: Insufficient documentation

## 2014-01-08 DIAGNOSIS — E1149 Type 2 diabetes mellitus with other diabetic neurological complication: Secondary | ICD-10-CM | POA: Insufficient documentation

## 2014-01-08 DIAGNOSIS — I10 Essential (primary) hypertension: Secondary | ICD-10-CM | POA: Insufficient documentation

## 2014-01-08 DIAGNOSIS — E1142 Type 2 diabetes mellitus with diabetic polyneuropathy: Secondary | ICD-10-CM | POA: Insufficient documentation

## 2014-01-08 DIAGNOSIS — L03119 Cellulitis of unspecified part of limb: Principal | ICD-10-CM

## 2014-01-08 LAB — CBG MONITORING, ED: Glucose-Capillary: 288 mg/dL — ABNORMAL HIGH (ref 70–99)

## 2014-01-08 MED ORDER — TRAMADOL HCL 50 MG PO TABS
50.0000 mg | ORAL_TABLET | Freq: Once | ORAL | Status: AC
  Administered 2014-01-08: 50 mg via ORAL
  Filled 2014-01-08: qty 1

## 2014-01-08 MED ORDER — TRAMADOL HCL 50 MG PO TABS: 50.0000 mg | ORAL_TABLET | Freq: Four times a day (QID) | ORAL | Status: DC | PRN

## 2014-01-08 MED ORDER — DOXYCYCLINE HYCLATE 100 MG PO CAPS: 100.0000 mg | ORAL_CAPSULE | Freq: Two times a day (BID) | ORAL | Status: DC

## 2014-01-08 NOTE — Discharge Instructions (Signed)
Cellulitis Cellulitis is an infection of the skin and the tissue beneath it. The infected area is usually red and tender. Cellulitis occurs most often in the arms and lower legs.  CAUSES  Cellulitis is caused by bacteria that enter the skin through cracks or cuts in the skin. The most common types of bacteria that cause cellulitis are Staphylococcus and Streptococcus. SYMPTOMS   Redness and warmth.  Swelling.  Tenderness or pain.  Fever. DIAGNOSIS  Your caregiver can usually determine what is wrong based on a physical exam. Blood tests may also be done. TREATMENT  Treatment usually involves taking an antibiotic medicine. HOME CARE INSTRUCTIONS   Take your antibiotics as directed. Finish them even if you start to feel better.  Keep the infected arm or leg elevated to reduce swelling.  Apply a warm cloth to the affected area up to 4 times per day to relieve pain.  Only take over-the-counter or prescription medicines for pain, discomfort, or fever as directed by your caregiver.  Keep all follow-up appointments as directed by your caregiver. SEEK MEDICAL CARE IF:   You notice red streaks coming from the infected area.  Your red area gets larger or turns dark in color.  Your bone or joint underneath the infected area becomes painful after the skin has healed.  Your infection returns in the same area or another area.  You notice a swollen bump in the infected area.  You develop new symptoms. SEEK IMMEDIATE MEDICAL CARE IF:   You have a fever.  You feel very sleepy.  You develop vomiting or diarrhea.  You have a general ill feeling (malaise) with muscle aches and pains. MAKE SURE YOU:   Understand these instructions.  Will watch your condition.  Will get help right away if you are not doing well or get worse. Document Released: 07/04/2005 Document Revised: 03/25/2012 Document Reviewed: 12/10/2011 Exit[employer] Patient Information 2014 [employer].  

## 2014-01-08 NOTE — ED Notes (Signed)
Pt in c/o left foot pain and swelling x3 months, went to podiatrist about same, but states pain has continued, wound noted under pinky toe

## 2014-01-08 NOTE — ED Provider Notes (Signed)
CSN: [id number]     Arrival date & time 01/08/14  1249 History   First MD Initiated Contact with Patient 01/08/14 1501     Chief Complaint  Patient presents with  . Foot Pain     (Consider location/radiation/quality/duration/timing/severity/associated sxs/prior Treatment) Patient is a 58 y.o. female presenting with lower extremity pain. The history is provided by the patient.  Foot Pain This is a chronic problem. The current episode started more than 1 week ago. The problem occurs constantly. The problem has been gradually worsening. Pertinent negatives include no chest pain, no abdominal pain, no headaches and no shortness of breath. The symptoms are aggravated by walking. Nothing relieves the symptoms. She has tried acetaminophen for the symptoms. The treatment provided mild relief.    Past Medical History  Diagnosis Date  . Bell's palsy 01/2009    Left sided  . Type II diabetes mellitus   . Hypertension   . Hyperlipidemia   . Allergic rhinitis   . Intolerance, drug     Leg cramps on Lipitor  . GERD (gastroesophageal reflux disease)   . Uterine fibroid   . Chest pain     Exercise stress test negative 6/07  . Postmenopausal symptoms     Hot flashes, vaginal dryness, iritability, difficulty sleeping. as of 2005.  Marland Kitchen Insomnia   . External hemorrhoid   . Lipoma 12/11    Posterior neck.   . Osteoarthritis     Carpometacarpal joint of right thumb  . Trigger finger     Right thumb  . Sebaceous cyst of breast     Has been refered to derm.  . Adenomatous colon polyp 6/09    Resected on colonoscopy, no high grade dysplasia.   . Hypertensive retinopathy     Followed by Dr. Herbert Deaner.  . Diabetic peripheral neuropathy   . Postmenopausal bleeding 9/05    Endometrial biopsy showed  FOCAL TUBAL METAPLASIA   . Colon polyps     03/22/2008: adenomatous polyps x3 w no dysplasia. Needs repeat colonoscopy in 3 years   Past Surgical History  Procedure Laterality Date  . Tubal ligation      Family History  Problem Relation Age of Onset  . Pneumonia Mother   . Diabetes Mother   . Early death Father   . Diabetes Sister   . Diabetes Brother   . Diabetes Maternal Grandmother    History  Substance Use Topics  . Smoking status: Current Every Day Smoker -- 0.30 packs/day for 30 years    Types: Cigarettes  . Smokeless tobacco: Former Systems developer    Quit date: 11/28/2010     Comment: trying to quit .  Doing a little less now.  . Alcohol Use: No   OB History   Grav Para Term Preterm Abortions TAB SAB Ect Mult Living                 Review of Systems  Constitutional: Negative for fever and fatigue.  HENT: Negative for congestion and drooling.   Eyes: Negative for pain.  Respiratory: Negative for cough and shortness of breath.   Cardiovascular: Negative for chest pain.  Gastrointestinal: Negative for nausea, vomiting, abdominal pain and diarrhea.  Genitourinary: Negative for dysuria and hematuria.  Musculoskeletal: Negative for back pain, gait problem and neck pain.  Skin: Negative for color change.  Neurological: Negative for dizziness and headaches.  Hematological: Negative for adenopathy.  Psychiatric/Behavioral: Negative for behavioral problems.  All other systems reviewed and are negative.  Allergies  Codeine  Home Medications   Current Outpatient Rx  Name  Route  Sig  Dispense  Refill  . aspirin 81 MG EC tablet   Oral   Take 81 mg by mouth daily.           Marland Kitchen atenolol-chlorthalidone (TENORETIC) 50-25 MG per tablet   Oral   Take 1 tablet by mouth daily.   90 tablet   4   . atorvastatin (LIPITOR) 40 MG tablet   Oral   Take 1 tablet (40 mg total) by mouth daily.   30 tablet   11   . diphenhydrAMINE (BENADRYL) 12.5 MG/5ML liquid   Oral   Take by mouth at bedtime as needed (coughing/sneezing). 1/2 capful         . losartan (COZAAR) 100 MG tablet   Oral   Take 1 tablet (100 mg total) by mouth daily.   30 tablet   5   . metFORMIN  (GLUMETZA) 1000 MG (MOD) 24 hr tablet   Oral   Take 2 tablets (2,000 mg total) by mouth daily with breakfast.   60 tablet   5   . Polyethyl Glycol-Propyl Glycol (SYSTANE OP)   Both Eyes   Place 1 drop into both eyes 3 (three) times daily.         Vladimir Faster Glycol-Propyl Glycol (SYSTANE) 0.4-0.3 % GEL   Both Eyes   Place 1 application into both eyes at bedtime.          BP 134/64  Pulse 64  Temp(Src) 98.2 F (36.8 C) (Oral)  Resp 20  Wt 171 lb 3.2 oz (77.656 kg)  SpO2 98% Physical Exam  Nursing note and vitals reviewed. Constitutional: She is oriented to person, place, and time. She appears well-developed and well-nourished.  HENT:  Head: Normocephalic.  Mouth/Throat: No oropharyngeal exudate.  Eyes: Conjunctivae and EOM are normal. Pupils are equal, round, and reactive to light.  Neck: Normal range of motion. Neck supple.  Cardiovascular: Normal rate, regular rhythm, normal heart sounds and intact distal pulses.  Exam reveals no gallop and no friction rub.   No murmur heard. Pulmonary/Chest: Effort normal and breath sounds normal. No respiratory distress. She has no wheezes.  Abdominal: Soft. Bowel sounds are normal. There is no tenderness. There is no rebound and no guarding.  Musculoskeletal: Normal range of motion. She exhibits no edema and no tenderness.  A. mild to moderate size bunion is noted on the distal lateral plantar aspect of the left foot which is tender to palpation.  Some faint erythema is noted on the distal aspect of the left lateral distal foot.  She has normal range of motion of the left lower extremity.  2+ distal pulses in the left lower extremity.  Normal sensation in the left lower extremity.  Neurological: She is alert and oriented to person, place, and time.  Skin: Skin is warm and dry.  Psychiatric: She has a normal mood and affect. Her behavior is normal.    ED Course  Procedures (including critical care time) Labs Review Labs  Reviewed  CBG MONITORING, ED - Abnormal; Notable for the following:    Glucose-Capillary 288 (*)    All other components within normal limits   Imaging Review No results found.   EKG Interpretation None      MDM   Final diagnoses:  Left foot pain  Cellulitis    3:12 PM 58 y.o. female with a history of diabetes who presents with left foot  pain. She states that she began having left foot pain in the fall 2014 and had a bunionectomy by her podiatrist. She states that she continued to have pain and was seen last month by her new foot Dr. She states that in the last month a family member accidentally stepped on the foot and she hit it on the vacuum. She notes continued pain and now some very faint erythema on the dorsal aspect of the distal left foot. She is afebrile and denies any fevers at home. Her vital signs are unremarkable here. She appears well on exam. Will get terminal for pain, get screening x-ray to rule out any incidental or occult fractures.  4:06 PM: Plain film possibly c/w cellulitis, no evidence of bone involvement. Will start on doxy for suspected cellulitis. Also tramadol for pain.  I have discussed the diagnosis/risks/treatment options with the patient and believe the pt to be eligible for discharge home to follow-up with pcp in 3 days for repeat eval. We also discussed returning to the ED immediately if new or worsening sx occur. We discussed the sx which are most concerning (e.g., worsening pain, fever, spreading redness) that necessitate immediate return. Medications administered to the patient during their visit and any new prescriptions provided to the patient are listed below.  Medications given during this visit Medications  traMADol (ULTRAM) tablet 50 mg (50 mg Oral Given 01/08/14 1520)    New Prescriptions   DOXYCYCLINE (VIBRAMYCIN) 100 MG CAPSULE    Take 1 capsule (100 mg total) by mouth 2 (two) times daily. One po bid x 10 days   TRAMADOL (ULTRAM) 50 MG TABLET     Take 1 tablet (50 mg total) by mouth every 6 (six) hours as needed for moderate pain.     Blanchard Kelch, MD 01/09/14 [phone]

## 2014-01-08 NOTE — ED Notes (Addendum)
Pt reports that she has not taken her metformin this morning because she felt bad and didn't eat.

## 2014-01-11 ENCOUNTER — Encounter: Payer: Self-pay | Admitting: Internal Medicine

## 2014-01-11 ENCOUNTER — Ambulatory Visit (INDEPENDENT_AMBULATORY_CARE_PROVIDER_SITE_OTHER): Payer: Medicaid Other | Admitting: Internal Medicine

## 2014-01-11 VITALS — BP 141/88 | HR 70 | Temp 96.7°F | Ht 64.0 in | Wt 174.1 lb

## 2014-01-11 DIAGNOSIS — H35039 Hypertensive retinopathy, unspecified eye: Secondary | ICD-10-CM

## 2014-01-11 DIAGNOSIS — L03119 Cellulitis of unspecified part of limb: Secondary | ICD-10-CM

## 2014-01-11 DIAGNOSIS — I10 Essential (primary) hypertension: Secondary | ICD-10-CM

## 2014-01-11 DIAGNOSIS — L0390 Cellulitis, unspecified: Secondary | ICD-10-CM

## 2014-01-11 DIAGNOSIS — E1159 Type 2 diabetes mellitus with other circulatory complications: Secondary | ICD-10-CM

## 2014-01-11 DIAGNOSIS — L02619 Cutaneous abscess of unspecified foot: Secondary | ICD-10-CM

## 2014-01-11 LAB — GLUCOSE, CAPILLARY: Glucose-Capillary: 312 mg/dL — ABNORMAL HIGH (ref 70–99)

## 2014-01-11 LAB — POCT GLYCOSYLATED HEMOGLOBIN (HGB A1C): Hemoglobin A1C: 10.6

## 2014-01-11 MED ORDER — GLIMEPIRIDE 2 MG PO TABS: 2.0000 mg | ORAL_TABLET | ORAL | Status: DC

## 2014-01-11 MED ORDER — OXYCODONE HCL 5 MG PO TABS: 5.0000 mg | ORAL_TABLET | Freq: Four times a day (QID) | ORAL | Status: DC | PRN

## 2014-01-11 NOTE — Patient Instructions (Addendum)
1. Please schedule a follow up appointment for 2-4 weeks.  2. Please take all medications as prescribed. Take Oxycodone only when you have pain as directed. Start taking Glimepiride 2 mg in AM   3. If you have worsening of your symptoms or new symptoms arise, please call the clinic [phone]), or go to the ER immediately if symptoms are severe.  You have done a great job in taking all your medications. I appreciate it very much. Please continue doing that.

## 2014-01-11 NOTE — Progress Notes (Signed)
Patient ID: Bridget Owens, female   DOB: [birth year], 58 y.o.   MRN: [id number]  Subjective:   Patient ID: Bridget Owens female   DOB: 01/19/1956 58 y.o.   MRN: [id number]  HPI: [name] is a 58 y.o. F w/ PMHx of DM type II, HTN, HLD, GERD, OA, and other co-morbidities, presents to clinic today for follow up visit. Bridget Owens was seen on 01/08/14 for foot pain, XR was performed, showed soft tissue swelling suggestive of cellulitis. She was sent home at that time w/ Rx for Doxycycline + Tramadol. Today, she claims her foot is still hurting, but seems much improved to her since she was seen in the ED. She claims the swelling and redness is decreased from before. The pain, however, is still not well controlled by Tramadol. She denies any fever, chills, nausea, vomiting.  The patient has also had recent issues controlling her blood sugars. She comes to clinic w/ glucometer CBG's in the 300's. On 10/29/13, the patient presented to the clinic for high blood sugars (300-400) and at that time, her Metformin dose was increased form 1000 mg qd to 2000 mg qd.  Today, her repeat HbA1c was 10.6, down from 11.6 on 10/29/13. However, prior to this, her HbA1c was 5.8 on 07/03/14.  Past Medical History  Diagnosis Date  . Bell's palsy 01/2009    Left sided  . Type II diabetes mellitus   . Hypertension   . Hyperlipidemia   . Allergic rhinitis   . Intolerance, drug     Leg cramps on Lipitor  . GERD (gastroesophageal reflux disease)   . Uterine fibroid   . Chest pain     Exercise stress test negative 6/07  . Postmenopausal symptoms     Hot flashes, vaginal dryness, iritability, difficulty sleeping. as of 2005.  Marland Kitchen Insomnia   . External hemorrhoid   . Lipoma 12/11    Posterior neck.   . Osteoarthritis     Carpometacarpal joint of right thumb  . Trigger finger     Right thumb  . Sebaceous cyst of breast     Has been refered to derm.  . Adenomatous colon polyp 6/09    Resected on colonoscopy, no high grade  dysplasia.   . Hypertensive retinopathy     Followed by Dr. Herbert Deaner.  . Diabetic peripheral neuropathy   . Postmenopausal bleeding 9/05    Endometrial biopsy showed  FOCAL TUBAL METAPLASIA   . Colon polyps     03/22/2008: adenomatous polyps x3 w no dysplasia. Needs repeat colonoscopy in 3 years   Current Outpatient Prescriptions  Medication Sig Dispense Refill  . aspirin 81 MG EC tablet Take 81 mg by mouth daily.        Marland Kitchen atenolol-chlorthalidone (TENORETIC) 50-25 MG per tablet Take 1 tablet by mouth daily.  90 tablet  4  . atorvastatin (LIPITOR) 40 MG tablet Take 1 tablet (40 mg total) by mouth daily.  30 tablet  11  . diphenhydrAMINE (BENADRYL) 12.5 MG/5ML liquid Take by mouth at bedtime as needed (coughing/sneezing). 1/2 capful      . doxycycline (VIBRAMYCIN) 100 MG capsule Take 1 capsule (100 mg total) by mouth 2 (two) times daily. One po bid x 10 days  20 capsule  0  . losartan (COZAAR) 100 MG tablet Take 1 tablet (100 mg total) by mouth daily.  30 tablet  5  . metFORMIN (GLUMETZA) 1000 MG (MOD) 24 hr tablet Take 2 tablets (2,000 mg total) by  mouth daily with breakfast.  60 tablet  5  . Polyethyl Glycol-Propyl Glycol (SYSTANE OP) Place 1 drop into both eyes 3 (three) times daily.      Vladimir Faster Glycol-Propyl Glycol (SYSTANE) 0.4-0.3 % GEL Place 1 application into both eyes at bedtime.      . traMADol (ULTRAM) 50 MG tablet Take 1 tablet (50 mg total) by mouth every 6 (six) hours as needed for moderate pain.  15 tablet  0  . [DISCONTINUED] pantoprazole (PROTONIX) 20 MG tablet Take 1 tablet (20 mg total) by mouth daily.  30 tablet  0   No current facility-administered medications for this visit.   Family History  Problem Relation Age of Onset  . Pneumonia Mother   . Diabetes Mother   . Early death Father   . Diabetes Sister   . Diabetes Brother   . Diabetes Maternal Grandmother    History   Social History  . Marital Status: Single    Spouse Name: N/A    Number of Children:  N/A  . Years of Education: N/A   Social History Main Topics  . Smoking status: Current Every Day Smoker -- 0.30 packs/day for 30 years    Types: Cigarettes  . Smokeless tobacco: Former Systems developer    Quit date: 11/28/2010     Comment: trying to quit .  Doing a little less now.  . Alcohol Use: No  . Drug Use: No  . Sexual Activity: None   Other Topics Concern  . None   Social History Narrative  . None   Review of Systems: General: Denies fever, chills, diaphoresis, appetite change and fatigue.  Respiratory: Denies SOB, DOE, cough, chest tightness, and wheezing.   Cardiovascular: Denies chest pain, palpitations and leg swelling.  Gastrointestinal: Denies nausea, vomiting, abdominal pain, diarrhea, constipation, blood in stool and abdominal distention.  Genitourinary: Denies dysuria, urgency, frequency, hematuria, flank pain and difficulty urinating.  Endocrine: Denies hot or cold intolerance, sweats, polyuria, polydipsia. Musculoskeletal: Positive for left foot pain. Denies myalgias, back pain, joint swelling, and gait problem.  Skin: Denies pallor, rash and wounds.  Neurological: Denies dizziness, lightheadedness, seizures, syncope, weakness, numbness and headaches.  Psychiatric/Behavioral: Denies mood changes, confusion, nervousness, sleep disturbance and agitation.  Objective:  Physical Exam: Filed Vitals:   01/11/14 1338  BP: 141/88  Pulse: 70  Temp: 96.7 F (35.9 C)  TempSrc: Oral  Height: 5\' 4"  (1.626 m)  Weight: 174 lb 1.6 oz (78.971 kg)  SpO2: 98%  General: Vital signs reviewed.  Patient is a well-developed and well-nourished, in no acute distress and cooperative with exam. Alert and oriented x3.  Head: Normocephalic and atraumatic. Eyes: PERRL, EOMI, conjunctivae normal, No scleral icterus.  Neck: Supple, trachea midline, normal ROM, No JVD, masses, thyromegaly, or carotid bruit present.  Cardiovascular: RRR, S1 normal, S2 normal, no murmurs, gallops, or  rubs. Pulmonary/Chest: Normal respiratory effort, CTAB, no wheezes, rales, or rhonchi. Abdominal: Soft, non-tender, non-distended, bowel sounds are normal, no masses, organomegaly, or guarding present.  Musculoskeletal: No joint deformities, erythema, or stiffness, ROM full and no nontender. Extremities: No swelling or edema,  pulses symmetric and intact bilaterally. No cyanosis or clubbing. Left 5th metatarsal erythema and tenderness to palpation. Mild swelling on the dorsum over the foot.  Neurological: A&O x3, Strength is normal and symmetric bilaterally, cranial nerve II-XII are grossly intact, no focal motor deficit, sensory intact to light touch bilaterally.  Skin: Warm, dry and intact. No rashes or erythema. Psychiatric: Normal mood and affect.  speech and behavior is normal. Cognition and memory are normal.   Assessment & Plan:   Please see problem-based assessment and plan.

## 2014-01-12 NOTE — Assessment & Plan Note (Signed)
Seen on 01/08/14 in ED for foot pain. XR showed soft tissue swelling significant for cellulitis. Given Doxy at that time, still continuing this. Patient claims the swelling and redness has improved since her ED visit, however, pain is still not well controlled by Tramadol.  -Continue Doxycycline -Given Oxycodone 5 mg #10 for pain. -Patient to return in 2-4 weeks for follow up

## 2014-01-12 NOTE — Assessment & Plan Note (Signed)
Patient w/ issues involving elevated blood sugars recently. Glucometer log from home shows CBG's average in 300's. On Metformin 2000 mg po qd ONLY. Was previously taking Glimepiride 4 mg po qd, but this was discontinued 2/2 hypoglycemia. HbA1c re-checked today, 10.6, decreased from 11.6 on 10/29/13. HOwever, previously HbA1c was 5.8 on 9/26. -Continue Metformin -Restart Glimepiride 2 mg po qd -Have patient return in 2-4 weeks for follow up.  -May need Lantus in the future if blood sugar continues to be not adequately controlled.

## 2014-01-12 NOTE — Assessment & Plan Note (Signed)
Patient to see Dr. Herbert Deaner in ~1 week. No issues w/ her vision at this time.  -Referral placed.

## 2014-01-13 NOTE — Progress Notes (Signed)
Case discussed with Dr. Arndt at time of visit.  We reviewed the resident's history and exam and pertinent patient test results.  I agree with the assessment, diagnosis, and plan of care documented in the resident's note. 

## 2014-01-28 ENCOUNTER — Telehealth: Payer: Self-pay | Admitting: *Deleted

## 2014-01-28 DIAGNOSIS — B373 Candidiasis of vulva and vagina: Secondary | ICD-10-CM | POA: Insufficient documentation

## 2014-01-28 DIAGNOSIS — B3731 Acute candidiasis of vulva and vagina: Secondary | ICD-10-CM | POA: Insufficient documentation

## 2014-01-28 MED ORDER — FLUCONAZOLE 150 MG PO TABS: 150.0000 mg | ORAL_TABLET | Freq: Every day | ORAL | Status: DC

## 2014-01-28 NOTE — Telephone Encounter (Signed)
Attempted to call patient 3 times to discuss symptoms w/ Bridget Owens, but did not answer. Patient has a clinic appt on 02/05/14. Will order Diflucan 150 mg once for her and send to pharmacy. If symptoms do not resolve, she should be seen in the clinic sooner.

## 2014-01-28 NOTE — Assessment & Plan Note (Signed)
Patient called w/ symptoms of itching and discomfort, s/p full course of Doxycycline for cellulitis.  -Will give one dose of Diflucan 150 mg.  -If symptoms do not resolve, patient needs to be seen in the clinic.

## 2014-01-28 NOTE — Telephone Encounter (Signed)
Pt called with c/o vaginal irritation and itching.  She was taking Doxy for 10 days 4/3 - 4/13. She was given this in ED for cellulitis. She is asking for pill ( diflucan ) and cream for this.  Will you order this?  Pt # [phone]

## 2014-02-05 ENCOUNTER — Encounter: Payer: Self-pay | Admitting: Internal Medicine

## 2014-02-05 ENCOUNTER — Ambulatory Visit (INDEPENDENT_AMBULATORY_CARE_PROVIDER_SITE_OTHER): Payer: Medicaid Other | Admitting: Internal Medicine

## 2014-02-05 VITALS — BP 155/90 | HR 79 | Temp 97.4°F | Ht 66.0 in | Wt 176.1 lb

## 2014-02-05 DIAGNOSIS — E1159 Type 2 diabetes mellitus with other circulatory complications: Secondary | ICD-10-CM

## 2014-02-05 DIAGNOSIS — L0390 Cellulitis, unspecified: Secondary | ICD-10-CM

## 2014-02-05 DIAGNOSIS — B373 Candidiasis of vulva and vagina: Secondary | ICD-10-CM

## 2014-02-05 DIAGNOSIS — L659 Nonscarring hair loss, unspecified: Secondary | ICD-10-CM

## 2014-02-05 DIAGNOSIS — L0291 Cutaneous abscess, unspecified: Secondary | ICD-10-CM

## 2014-02-05 DIAGNOSIS — I10 Essential (primary) hypertension: Secondary | ICD-10-CM

## 2014-02-05 DIAGNOSIS — L299 Pruritus, unspecified: Secondary | ICD-10-CM | POA: Insufficient documentation

## 2014-02-05 DIAGNOSIS — B3731 Acute candidiasis of vulva and vagina: Secondary | ICD-10-CM

## 2014-02-05 LAB — GLUCOSE, CAPILLARY: Glucose-Capillary: 170 mg/dL — ABNORMAL HIGH (ref 70–99)

## 2014-02-05 LAB — TSH: TSH: 3.332 u[IU]/mL (ref 0.350–4.500)

## 2014-02-05 MED ORDER — CALAMINE EX LOTN: 1.0000 "application " | TOPICAL_LOTION | CUTANEOUS | Status: DC | PRN

## 2014-02-05 MED ORDER — HYDROXYZINE HCL 10 MG PO TABS: 10.0000 mg | ORAL_TABLET | Freq: Three times a day (TID) | ORAL | Status: DC | PRN

## 2014-02-05 NOTE — Assessment & Plan Note (Signed)
Patient previously w/ CBG's in the 300's, most recent HbA1c 10.6 at last clinic visit. Restarted Glimepiride 2 mg at that time. Patient now claims her CBG's have been well controlled at home, in the low-100's. Today during clinic visit, CBG of 170, significantly improved from previous.  -Continue w/ DM regimen at this time (Metformin 2000 mg daily + Glimepiride 2 mg daily) -Have patient return in 8 weeks for HbA1c recheck. Can make further adjustments to medications at that time.

## 2014-02-05 NOTE — Progress Notes (Signed)
Subjective:   Patient ID: Bridget Owens female   DOB: Nov 11, 1955 58 y.o.   MRN: [id number]  HPI: [name] is a 58 y.0. F w/ PMHx of DM type II, HTN, HLD, GERD, OA, and other co-morbidities, presents to clinic today for follow up visit. Bridget Owens was last seen in clinic on 01/12/14 for foot pain. XR was performed in ED on 01/08/14, showed soft tissue swelling suggestive of cellulitis. Today, her foot is significantly improved, no longer swollen, erythematous, or tender to the touch. Not requiring pain medications any longer for this. No fever, chills, nausea, or vomiting.  The patient has also had recent issues controlling her blood sugars. She had previously come to clinic w/ CBG's in the 300's, most recent HbA1c 10.6. During this last visit, restarted Glimepiride 2 mg po qd in addition to her Metformin. Today, CBG of 170, and patient claims her blood sugars have been much improved at home since her previous visit. Patient had also complained recently of vaginal pruritis. Has previously been treated for BV w/ Metronidazole as recently as January and also has a h/o vaginal candidiasis as well. Given Diflucan 150 mg once on 01/28/14 w/ complete resolution of symptoms. Main complaint today is generalized pruritis. She denies any recent bites, h/o bedbugs, fleas, lice, etc. On exam, patient w/out significant excoriations, lice, nits, or bite marks. Also claims her hair is falling out, however, this has been going on for 1-2 years now. No further complaints.  Past Medical History  Diagnosis Date  . Bell's palsy 01/2009    Left sided  . Type II diabetes mellitus   . Hypertension   . Hyperlipidemia   . Allergic rhinitis   . Intolerance, drug     Leg cramps on Lipitor  . GERD (gastroesophageal reflux disease)   . Uterine fibroid   . Chest pain     Exercise stress test negative 6/07  . Postmenopausal symptoms     Hot flashes, vaginal dryness, iritability, difficulty sleeping. as of 2005.  Marland Kitchen  Insomnia   . External hemorrhoid   . Lipoma 12/11    Posterior neck.   . Osteoarthritis     Carpometacarpal joint of right thumb  . Trigger finger     Right thumb  . Sebaceous cyst of breast     Has been refered to derm.  . Adenomatous colon polyp 6/09    Resected on colonoscopy, no high grade dysplasia.   . Hypertensive retinopathy     Followed by Dr. Herbert Deaner.  . Diabetic peripheral neuropathy   . Postmenopausal bleeding 9/05    Endometrial biopsy showed  FOCAL TUBAL METAPLASIA   . Colon polyps     03/22/2008: adenomatous polyps x3 w no dysplasia. Needs repeat colonoscopy in 3 years   Current Outpatient Prescriptions  Medication Sig Dispense Refill  . aspirin 81 MG EC tablet Take 81 mg by mouth daily.        Marland Kitchen atenolol-chlorthalidone (TENORETIC) 50-25 MG per tablet Take 1 tablet by mouth daily.  90 tablet  4  . atorvastatin (LIPITOR) 40 MG tablet Take 1 tablet (40 mg total) by mouth daily.  30 tablet  11  . diphenhydrAMINE (BENADRYL) 12.5 MG/5ML liquid Take by mouth at bedtime as needed (coughing/sneezing). 1/2 capful      . fluconazole (DIFLUCAN) 150 MG tablet Take 1 tablet (150 mg total) by mouth daily.  1 tablet  0  . glimepiride (AMARYL) 2 MG tablet Take 1 tablet (2 mg total)  by mouth every morning.  30 tablet  5  . losartan (COZAAR) 100 MG tablet Take 1 tablet (100 mg total) by mouth daily.  30 tablet  5  . metFORMIN (GLUMETZA) 1000 MG (MOD) 24 hr tablet Take 2 tablets (2,000 mg total) by mouth daily with breakfast.  60 tablet  5  . oxyCODONE (ROXICODONE) 5 MG immediate release tablet Take 1 tablet (5 mg total) by mouth every 6 (six) hours as needed.  10 tablet  0  . Polyethyl Glycol-Propyl Glycol (SYSTANE OP) Place 1 drop into both eyes 3 (three) times daily.      Vladimir Faster Glycol-Propyl Glycol (SYSTANE) 0.4-0.3 % GEL Place 1 application into both eyes at bedtime.      . traMADol (ULTRAM) 50 MG tablet Take 1 tablet (50 mg total) by mouth every 6 (six) hours as needed for  moderate pain.  15 tablet  0  . [DISCONTINUED] pantoprazole (PROTONIX) 20 MG tablet Take 1 tablet (20 mg total) by mouth daily.  30 tablet  0   No current facility-administered medications for this visit.   Family History  Problem Relation Age of Onset  . Pneumonia Mother   . Diabetes Mother   . Early death Father   . Diabetes Sister   . Diabetes Brother   . Diabetes Maternal Grandmother    History   Social History  . Marital Status: Single    Spouse Name: N/A    Number of Children: N/A  . Years of Education: N/A   Social History Main Topics  . Smoking status: Current Every Day Smoker -- 0.30 packs/day for 30 years    Types: Cigarettes  . Smokeless tobacco: Former Systems developer    Quit date: 11/28/2010     Comment: trying to quit .  Doing a little less now.  . Alcohol Use: No  . Drug Use: No  . Sexual Activity: None   Other Topics Concern  . None   Social History Narrative  . None   Review of Systems: General: Positive for generalized pruritis. Denies fever, chills, diaphoresis, appetite change and fatigue.  Respiratory: Denies SOB, DOE, cough, chest tightness, and wheezing.   Cardiovascular: Denies chest pain, palpitations and leg swelling.  Gastrointestinal: Denies nausea, vomiting, abdominal pain, diarrhea, constipation, blood in stool and abdominal distention.  Genitourinary: Denies dysuria, urgency, frequency, hematuria, flank pain and difficulty urinating.  Endocrine: Denies hot or cold intolerance, sweats, polyuria, polydipsia. Musculoskeletal: Denies myalgias, back pain, joint swelling, and gait problem.  Skin: Denies pallor, rash and wounds.  Neurological: Denies dizziness, lightheadedness, seizures, syncope, weakness, numbness and headaches.  Psychiatric/Behavioral: Denies mood changes, confusion, nervousness, sleep disturbance and agitation.  Objective:   Physical Exam: Filed Vitals:   02/05/14 1050  BP: 155/90  Pulse: 79  Temp: 97.4 F (36.3 C)  TempSrc:  Oral  Height: 5\' 6"  (1.676 m)  Weight: 176 lb 1.6 oz (79.878 kg)  SpO2: 100%   General: Vital signs reviewed. Patient is a well-developed and well-nourished, in no acute distress and cooperative with exam.  Head: Normocephalic and atraumatic. Hair thinning/loss over the apex of the scalp. No excoriations, dandruff, or bite marks. No erythema.  Eyes: PERRL, EOMI, conjunctivae normal, No scleral icterus.  Neck: Supple, trachea midline, normal ROM, No JVD, masses, thyromegaly, or carotid bruit present.  Cardiovascular: RRR, S1 normal, S2 normal, no murmurs, gallops, or rubs. Pulmonary/Chest: Normal respiratory effort, CTAB, no wheezes, rales, or rhonchi. Abdominal: Soft, non-tender, non-distended, bowel sounds are normal, no masses, organomegaly,  or guarding present.  Musculoskeletal: No joint deformities, erythema, or stiffness, ROM full and no nontender. Extremities: No swelling or edema,  pulses symmetric and intact bilaterally. No cyanosis or clubbing. Left 5th metatarsal no longer tender to palpation. No swelling or erythema. Mild skin changes, appears to be healing well.  Neurological: A&O x3, Strength is normal and symmetric bilaterally, cranial nerve II-XII are grossly intact, no focal motor deficit, sensory intact to light touch bilaterally.  Skin: Warm, dry and intact. No rashes or erythema. Psychiatric: Normal mood and affect. speech and behavior is normal. Cognition and memory are normal.   Assessment & Plan:   Please see problem-based assessment and plan.

## 2014-02-05 NOTE — Assessment & Plan Note (Signed)
Almost completely resolved since previous visit. No longer w/ swelling, erythema, or tenderness. Appears to be healing quite well. Finished course of Doxycycline given to the patient in the ED on 01/08/14.  -No further intervention at this time

## 2014-02-05 NOTE — Patient Instructions (Signed)
1. Please schedule a follow up appointment for 6-8 weeks.  2. Please take all medications as prescribed. Please take Atarax 10 mg three times daily as needed for itching. I would start only taking it at night when you sleep. STOP taking Benadryl.   3. If you have worsening of your symptoms or new symptoms arise, please call the clinic ([phone]), or go to the ER immediately if symptoms are severe.  You have done a great job in taking all your medications. I appreciate it very much. Please continue doing that.

## 2014-02-05 NOTE — Assessment & Plan Note (Signed)
Blood pressure 155/90 today, slightly elevated. Claims she has been complaint w/ home medications.  -No changes during this visit. If continues to be slightly elevated, will consider further changes at next visit.

## 2014-02-05 NOTE — Assessment & Plan Note (Signed)
Patient called clinic on 01/28/14 w/ complaints of vaginal pruritis. Attempted to discuss symptoms w/ patient over the phone, however, was not able to reach her. Called in Rx for Diflucan 150 mg po qd w/ significant relief of symptoms.  -Discussed importance of controlling blood sugars.

## 2014-02-05 NOTE — Progress Notes (Signed)
Case discussed with Dr. Vandervliet at the time of the visit.  We reviewed the resident's history and exam and pertinent patient test results.  I agree with the assessment, diagnosis, and plan of care documented in the resident's note. 

## 2014-02-05 NOTE — Assessment & Plan Note (Signed)
Patient claims she has been losing her hair for 1-2 years now. Most recent TSH 1.8 as of 11/2012. Denies lethargy, skin changes, palpitations, dizziness, and hot or cold intolerance. -Will recheck TSH today.

## 2014-02-05 NOTE — Assessment & Plan Note (Signed)
Patient complaining of generalized pruritis, but mostly involving LE's and scalp. No signs of insect bites, lice, excoriations, redness, or skin dryness.  -Gave Rx for Atarax 10 mg po tid prn for itching. Recommended trying at night first to reduce effect of drowsiness.  -Rx for Calamine lotion to apply over affected areas.

## 2014-03-03 ENCOUNTER — Other Ambulatory Visit: Payer: Self-pay | Admitting: *Deleted

## 2014-03-03 DIAGNOSIS — I10 Essential (primary) hypertension: Secondary | ICD-10-CM

## 2014-03-03 DIAGNOSIS — Z1231 Encounter for screening mammogram for malignant neoplasm of breast: Secondary | ICD-10-CM

## 2014-03-03 DIAGNOSIS — E1159 Type 2 diabetes mellitus with other circulatory complications: Secondary | ICD-10-CM

## 2014-03-03 DIAGNOSIS — E119 Type 2 diabetes mellitus without complications: Secondary | ICD-10-CM

## 2014-03-03 DIAGNOSIS — E785 Hyperlipidemia, unspecified: Secondary | ICD-10-CM

## 2014-03-03 DIAGNOSIS — G4700 Insomnia, unspecified: Secondary | ICD-10-CM

## 2014-03-03 DIAGNOSIS — Z86010 Personal history of colonic polyps: Secondary | ICD-10-CM

## 2014-03-03 DIAGNOSIS — Z0000 Encounter for general adult medical examination without abnormal findings: Secondary | ICD-10-CM

## 2014-03-03 DIAGNOSIS — D1779 Benign lipomatous neoplasm of other sites: Secondary | ICD-10-CM

## 2014-03-05 ENCOUNTER — Encounter (HOSPITAL_COMMUNITY): Payer: Self-pay | Admitting: Emergency Medicine

## 2014-03-05 ENCOUNTER — Emergency Department (HOSPITAL_COMMUNITY): Payer: Medicaid Other

## 2014-03-05 ENCOUNTER — Emergency Department (HOSPITAL_COMMUNITY)
Admission: EM | Admit: 2014-03-05 | Discharge: 2014-03-06 | Disposition: A | Discharge status: Home / Self Care | Payer: Medicaid Other | Attending: Emergency Medicine | Admitting: Emergency Medicine

## 2014-03-05 DIAGNOSIS — E785 Hyperlipidemia, unspecified: Secondary | ICD-10-CM | POA: Insufficient documentation

## 2014-03-05 DIAGNOSIS — F17200 Nicotine dependence, unspecified, uncomplicated: Secondary | ICD-10-CM | POA: Insufficient documentation

## 2014-03-05 DIAGNOSIS — Z8742 Personal history of other diseases of the female genital tract: Secondary | ICD-10-CM | POA: Insufficient documentation

## 2014-03-05 DIAGNOSIS — Z7982 Long term (current) use of aspirin: Secondary | ICD-10-CM | POA: Insufficient documentation

## 2014-03-05 DIAGNOSIS — Z8739 Personal history of other diseases of the musculoskeletal system and connective tissue: Secondary | ICD-10-CM | POA: Insufficient documentation

## 2014-03-05 DIAGNOSIS — J209 Acute bronchitis, unspecified: Secondary | ICD-10-CM | POA: Insufficient documentation

## 2014-03-05 DIAGNOSIS — Z859 Personal history of malignant neoplasm, unspecified: Secondary | ICD-10-CM | POA: Insufficient documentation

## 2014-03-05 DIAGNOSIS — E1149 Type 2 diabetes mellitus with other diabetic neurological complication: Secondary | ICD-10-CM | POA: Insufficient documentation

## 2014-03-05 DIAGNOSIS — Z860100 Personal history of colon polyps, unspecified: Secondary | ICD-10-CM | POA: Insufficient documentation

## 2014-03-05 DIAGNOSIS — Z792 Long term (current) use of antibiotics: Secondary | ICD-10-CM | POA: Insufficient documentation

## 2014-03-05 DIAGNOSIS — E1142 Type 2 diabetes mellitus with diabetic polyneuropathy: Secondary | ICD-10-CM | POA: Insufficient documentation

## 2014-03-05 DIAGNOSIS — Z79899 Other long term (current) drug therapy: Secondary | ICD-10-CM | POA: Insufficient documentation

## 2014-03-05 DIAGNOSIS — K219 Gastro-esophageal reflux disease without esophagitis: Secondary | ICD-10-CM | POA: Insufficient documentation

## 2014-03-05 DIAGNOSIS — Z8669 Personal history of other diseases of the nervous system and sense organs: Secondary | ICD-10-CM | POA: Insufficient documentation

## 2014-03-05 DIAGNOSIS — H35039 Hypertensive retinopathy, unspecified eye: Secondary | ICD-10-CM | POA: Insufficient documentation

## 2014-03-05 DIAGNOSIS — Z872 Personal history of diseases of the skin and subcutaneous tissue: Secondary | ICD-10-CM | POA: Insufficient documentation

## 2014-03-05 DIAGNOSIS — Z8719 Personal history of other diseases of the digestive system: Secondary | ICD-10-CM | POA: Insufficient documentation

## 2014-03-05 DIAGNOSIS — J40 Bronchitis, not specified as acute or chronic: Secondary | ICD-10-CM

## 2014-03-05 LAB — I-STAT TROPONIN, ED: Troponin i, poc: 0 ng/mL (ref 0.00–0.08)

## 2014-03-05 LAB — CBC
HCT: 37.3 % (ref 36.0–46.0)
Hemoglobin: 12.2 g/dL (ref 12.0–15.0)
MCH: 29.1 pg (ref 26.0–34.0)
MCHC: 32.7 g/dL (ref 30.0–36.0)
MCV: 89 fL (ref 78.0–100.0)
Platelets: 261 10*3/uL (ref 150–400)
RBC: 4.19 MIL/uL (ref 3.87–5.11)
RDW: 13.2 % (ref 11.5–15.5)
WBC: 6.1 10*3/uL (ref 4.0–10.5)

## 2014-03-05 LAB — BASIC METABOLIC PANEL
BUN: 14 mg/dL (ref 6–23)
CO2: 26 mEq/L (ref 19–32)
Calcium: 9.7 mg/dL (ref 8.4–10.5)
Chloride: 100 mEq/L (ref 96–112)
Creatinine, Ser: 0.76 mg/dL (ref 0.50–1.10)
GFR calc Af Amer: >90 mL/min (ref >90)
GFR calc non Af Amer: 90 mL/min — ABNORMAL LOW (ref >90)
Glucose, Bld: 154 mg/dL — ABNORMAL HIGH (ref 70–99)
Potassium: 3.9 mEq/L (ref 3.7–5.3)
Sodium: 139 mEq/L (ref 137–147)

## 2014-03-05 LAB — TROPONIN I: Troponin I: <0.3 ng/mL (ref <0.30)

## 2014-03-05 MED ORDER — IBUPROFEN 800 MG PO TABS
800.0000 mg | ORAL_TABLET | Freq: Three times a day (TID) | ORAL | Status: DC | PRN
Start: ? — End: 2014-03-12

## 2014-03-05 MED ORDER — IPRATROPIUM-ALBUTEROL 0.5-2.5 (3) MG/3ML IN SOLN
3.0000 mL | RESPIRATORY_TRACT | Status: DC
  Administered 2014-03-05: 3 mL via RESPIRATORY_TRACT
  Filled 2014-03-05: qty 3

## 2014-03-05 NOTE — ED Notes (Signed)
Alert, NAD, calm, interactive, ambulatory and sitting in room, neb started, in progress.

## 2014-03-05 NOTE — ED Notes (Signed)
Refused blood draw, refused IV, states, "ready to go".  EDP in to see pt.

## 2014-03-05 NOTE — ED Notes (Signed)
Pt alert, NAD, calm, interactive, skin W&D, resps e/u, speaking in clear complete sentences, sitting on edge of bed, no dyspnea, c/o L lung hurts, "just sob, cough and lung hurts".

## 2014-03-05 NOTE — ED Notes (Signed)
Presents with left sided chest pain and soreness. Pain began a few days ago after smelling burnt popcorn. Reports prodcutive cough with white phlegm. Nothing makes pain better, nothing makes pain worse. Pain is described as "sticking like a pin" and is intermittent. Denies nausea-reports SOB with cough. Sats 98% RA.

## 2014-03-05 NOTE — ED Provider Notes (Signed)
CSN: [id number]     Arrival date & time 03/05/14  1817 History   First MD Initiated Contact with Patient 03/05/14 2132     Chief Complaint  Patient presents with  . Chest Pain     (Consider location/radiation/quality/duration/timing/severity/associated sxs/prior Treatment) HPI Comments: Patient complains of productive cough of white yellow mucus for the past 2 days. Associated with some shortness of breath. She feels like her "bronchitis is acting up". She has some soreness on the left side of her chest it is constant and worse with palpation and movement. She is a smoker. She used to have an inhaler but does not have one anymore. She denies any abdominal pain, nausea, vomiting or fever. She denies any focal weakness, numbness or tingling. She has a history of diabetes, hypertension, hyperlipidemia. She denies any cardiac history. She had negative stress test in 2007. Her pain is constant and does not radiate. It is worse with coughing.  The history is provided by the patient and a relative.    Past Medical History  Diagnosis Date  . Bell's palsy 01/2009    Left sided  . Type II diabetes mellitus   . Hypertension   . Hyperlipidemia   . Allergic rhinitis   . Intolerance, drug     Leg cramps on Lipitor  . GERD (gastroesophageal reflux disease)   . Uterine fibroid   . Chest pain     Exercise stress test negative 6/07  . Postmenopausal symptoms     Hot flashes, vaginal dryness, iritability, difficulty sleeping. as of 2005.  Marland Kitchen Insomnia   . External hemorrhoid   . Lipoma 12/11    Posterior neck.   . Osteoarthritis     Carpometacarpal joint of right thumb  . Trigger finger     Right thumb  . Sebaceous cyst of breast     Has been refered to derm.  . Adenomatous colon polyp 6/09    Resected on colonoscopy, no high grade dysplasia.   . Hypertensive retinopathy     Followed by Dr. Herbert Deaner.  . Diabetic peripheral neuropathy   . Postmenopausal bleeding 9/05    Endometrial biopsy  showed  FOCAL TUBAL METAPLASIA   . Colon polyps     03/22/2008: adenomatous polyps x3 w no dysplasia. Needs repeat colonoscopy in 3 years   Past Surgical History  Procedure Laterality Date  . Tubal ligation     Family History  Problem Relation Age of Onset  . Pneumonia Mother   . Diabetes Mother   . Early death Father   . Diabetes Sister   . Diabetes Brother   . Diabetes Maternal Grandmother    History  Substance Use Topics  . Smoking status: Current Every Day Smoker -- 0.30 packs/day for 30 years    Types: Cigarettes  . Smokeless tobacco: Former Systems developer    Quit date: 11/28/2010     Comment: trying to quit .  Doing a little less now.  . Alcohol Use: No   OB History   Grav Para Term Preterm Abortions TAB SAB Ect Mult Living                 Review of Systems  Constitutional: Negative for fever, activity change and appetite change.  HENT: Positive for congestion and rhinorrhea.   Respiratory: Positive for cough and shortness of breath. Negative for chest tightness.   Cardiovascular: Positive for chest pain.  Gastrointestinal: Negative for nausea, vomiting and abdominal pain.  Genitourinary: Negative for vaginal bleeding, vaginal  discharge and dyspareunia.  Musculoskeletal: Negative for back pain.  Skin: Negative for pallor.  Neurological: Negative for dizziness, weakness and headaches.  A complete 10 system review of systems was obtained and all systems are negative except as noted in the HPI and PMH.      Allergies  Codeine  Home Medications   Prior to Admission medications   Medication Sig Start Date End Date Taking? Authorizing Provider  albuterol (PROVENTIL HFA;VENTOLIN HFA) 108 (90 BASE) MCG/ACT inhaler Inhale 1-2 puffs into the lungs every 6 (six) hours as needed for wheezing or shortness of breath. 03/06/14   Ezequiel Essex, MD  aspirin 81 MG EC tablet Take 81 mg by mouth daily.      Historical Provider, MD  atenolol-chlorthalidone (TENORETIC) 50-25 MG per  tablet Take 1 tablet by mouth daily. 10/29/13   Spangler Bales, MD  atorvastatin (LIPITOR) 40 MG tablet Take 1 tablet (40 mg total) by mouth daily. 10/29/13 10/29/14  Proctor Bales, MD  calamine lotion Apply 1 application topically as needed for itching. 02/05/14   Corky Sox, MD  doxycycline (VIBRAMYCIN) 100 MG capsule Take 1 capsule (100 mg total) by mouth 2 (two) times daily. 03/06/14   Ezequiel Essex, MD  fluconazole (DIFLUCAN) 150 MG tablet Take 1 tablet (150 mg total) by mouth daily. 01/28/14   Corky Sox, MD  glimepiride (AMARYL) 2 MG tablet Take 1 tablet (2 mg total) by mouth every morning. 01/11/14 01/11/15  Corky Sox, MD  hydrOXYzine (ATARAX/VISTARIL) 10 MG tablet Take 1 tablet (10 mg total) by mouth 3 (three) times daily as needed. 02/05/14   Corky Sox, MD  ibuprofen (ADVIL,MOTRIN) 800 MG tablet Take 1 tablet (800 mg total) by mouth every 8 (eight) hours as needed.    Corky Sox, MD  losartan (COZAAR) 100 MG tablet Take 1 tablet (100 mg total) by mouth daily. 10/29/13   Fedak Bales, MD  metFORMIN (GLUMETZA) 1000 MG (MOD) 24 hr tablet Take 2 tablets (2,000 mg total) by mouth daily with breakfast. 10/29/13   Zeidan Bales, MD  oxyCODONE (ROXICODONE) 5 MG immediate release tablet Take 1 tablet (5 mg total) by mouth every 6 (six) hours as needed. 01/11/14 01/11/15  Corky Sox, MD  Polyethyl Glycol-Propyl Glycol (SYSTANE OP) Place 1 drop into both eyes 3 (three) times daily.    Historical Provider, MD  Polyethyl Glycol-Propyl Glycol (SYSTANE) 0.4-0.3 % GEL Place 1 application into both eyes at bedtime.    Historical Provider, MD  traMADol (ULTRAM) 50 MG tablet Take 1 tablet (50 mg total) by mouth every 6 (six) hours as needed for moderate pain. 01/08/14   Blanchard Kelch, MD   BP 98/46  Pulse 68  Temp(Src) 98.1 F (36.7 C) (Oral)  Resp 16  SpO2 97% Physical Exam  Constitutional: She is oriented to person, place, and time. She appears well-developed. No distress.  HENT:  Head:  Normocephalic.  Mouth/Throat: Oropharynx is clear and moist. No oropharyngeal exudate.  Eyes: Conjunctivae and EOM are normal. Pupils are equal, round, and reactive to light.  Neck: Normal range of motion. Neck supple.  Cardiovascular: Normal rate, regular rhythm and normal heart sounds.   No murmur heard. Pulmonary/Chest: Effort normal. She has no wheezes. She exhibits tenderness.  L sided chest tenderness. No crepitance or bruising No wheezing  Abdominal: Soft. There is no tenderness. There is no rebound and no guarding.  Musculoskeletal: Normal range of motion. She exhibits no edema and  no tenderness.  Neurological: She is alert and oriented to person, place, and time. No cranial nerve deficit. She exhibits normal muscle tone. Coordination normal.  Skin: Skin is warm.    ED Course  Procedures (including critical care time) Labs Review Labs Reviewed  BASIC METABOLIC PANEL - Abnormal; Notable for the following:    Glucose, Bld 154 (*)    GFR calc non Af Amer 90 (*)    All other components within normal limits  CBC  TROPONIN I  D-DIMER, QUANTITATIVE  I-STAT TROPOININ, ED    Imaging Review Dg Chest 2 View  03/05/2014   CLINICAL DATA:  Hypertension, diabetes  EXAM: CHEST  2 VIEW  COMPARISON:  Chest radiograph 11/25/2012  FINDINGS: Normal cardiac silhouette. No effusion, infiltrate, or pneumothorax. Mild bronchitic markings. No aggressive osseous lesion.  IMPRESSION: Bronchitic markings.  No acute findings.   Electronically Signed   By: Suzy Bouchard M.D.   On: 03/05/2014 19:15     EKG Interpretation   Date/Time:  Friday Mar 05 2014 18:28:45 EDT Ventricular Rate:  78 PR Interval:  142 QRS Duration: 82 QT Interval:  400 QTC Calculation: 456 R Axis:   38 Text Interpretation:  Normal sinus rhythm Normal ECG No significant change  was found Confirmed by Arkeem Harts  MD, Cebastian Neis ([id number]) on 03/05/2014 9:34:02  PM      MDM   Final diagnoses:  Bronchitis   Cough with SOB and  congestion x 2 days.  L sided chest pain with coughing.  EKG unchanged.  No distress. Lungs clear, no wheezing.  Neb given with symptomatic relief.  CXR negative.  Ambulatory maintaing saturations at 100%.  Troponin negative x2. D-dimer negative. Low suspicion for ACS or PE.  Treat for bronchitis.  No steroids as she is not wheezing and is a diabetic.  Follow up with PCP this week. Return precautions discussed.  BP 98/46  Pulse 68  Temp(Src) 98.1 F (36.7 C) (Oral)  Resp 16  SpO2 97%     Ezequiel Essex, MD 03/06/14 [phone]

## 2014-03-05 NOTE — ED Notes (Signed)
Pt not in room. Ambulatory to b/r, alert, NAD, calm, interactive, steady gait, no dyspnea noted.

## 2014-03-05 NOTE — ED Notes (Signed)
EDP at [facility] 

## 2014-03-06 LAB — D-DIMER, QUANTITATIVE (NOT AT ARMC): D-Dimer, Quant: <0.27 ug/mL-FEU (ref 0.00–0.48)

## 2014-03-06 MED ORDER — DOXYCYCLINE HYCLATE 100 MG PO CAPS: 100.0000 mg | ORAL_CAPSULE | Freq: Two times a day (BID) | ORAL | Status: DC

## 2014-03-06 MED ORDER — ALBUTEROL SULFATE HFA 108 (90 BASE) MCG/ACT IN AERS: 1.0000 | INHALATION_SPRAY | Freq: Four times a day (QID) | RESPIRATORY_TRACT | Status: DC | PRN

## 2014-03-06 NOTE — ED Notes (Signed)
02 stayed at 100% while ambulating

## 2014-03-06 NOTE — Discharge Instructions (Signed)

## 2014-03-10 ENCOUNTER — Encounter: Payer: Medicaid Other | Admitting: Internal Medicine

## 2014-03-11 ENCOUNTER — Other Ambulatory Visit: Payer: Self-pay | Admitting: *Deleted

## 2014-03-11 DIAGNOSIS — E1159 Type 2 diabetes mellitus with other circulatory complications: Secondary | ICD-10-CM

## 2014-03-11 MED ORDER — METFORMIN HCL ER (MOD) 1000 MG PO TB24: 2000.0000 mg | ORAL_TABLET | Freq: Every day | ORAL | Status: DC

## 2014-03-12 ENCOUNTER — Ambulatory Visit (INDEPENDENT_AMBULATORY_CARE_PROVIDER_SITE_OTHER): Payer: Medicaid Other | Admitting: Internal Medicine

## 2014-03-12 ENCOUNTER — Encounter: Payer: Self-pay | Admitting: Internal Medicine

## 2014-03-12 VITALS — BP 163/90 | HR 78 | Temp 97.0°F | Wt 179.7 lb

## 2014-03-12 DIAGNOSIS — I10 Essential (primary) hypertension: Secondary | ICD-10-CM

## 2014-03-12 DIAGNOSIS — J209 Acute bronchitis, unspecified: Secondary | ICD-10-CM

## 2014-03-12 DIAGNOSIS — E1159 Type 2 diabetes mellitus with other circulatory complications: Secondary | ICD-10-CM

## 2014-03-12 LAB — GLUCOSE, CAPILLARY: Glucose-Capillary: 124 mg/dL — ABNORMAL HIGH (ref 70–99)

## 2014-03-12 MED ORDER — IBUPROFEN 800 MG PO TABS: 800.0000 mg | ORAL_TABLET | Freq: Three times a day (TID) | ORAL | Status: DC | PRN

## 2014-03-12 MED ORDER — METFORMIN HCL ER (MOD) 1000 MG PO TB24: 2000.0000 mg | ORAL_TABLET | Freq: Every day | ORAL | Status: DC

## 2014-03-12 MED ORDER — GUAIFENESIN-CODEINE 100-10 MG/5ML PO SOLN: 10.0000 mL | Freq: Three times a day (TID) | ORAL | Status: DC | PRN

## 2014-03-12 NOTE — Assessment & Plan Note (Addendum)
BP Readings from Last 3 Encounters:  03/12/14 163/90  03/06/14 98/46  02/05/14 155/90    Lab Results  Component Value Date   NA 139 03/05/2014   K 3.9 03/05/2014   CREATININE 0.76 03/05/2014    Assessment: Blood pressure control: mildly elevated Progress toward BP goal:  deteriorated Comments: BP elevated, hasn't taken BP meds in 2-3 days as she has not picked up her refill.    Plan: Medications:  continue current medications; Losartan 100 mg po qd + Atenolol-chlorthalidone 50-25 mg qd Educational resources provided: brochure Self management tools provided:   Other plans: RTC in 6 weeks.

## 2014-03-12 NOTE — Patient Instructions (Signed)
General Instructions:  1. Please schedule a follow up appointment for 6-8 weeks.   2. Please take all medications as prescribed. TAKE YOUR BLOOD PRESSURE MEDICINE!!  Take Robitussin AC for cough. Use tylenol for pain.  3. If you have worsening of your symptoms or new symptoms arise, please call the clinic ([phone]), or go to the ER immediately if symptoms are severe.  You have done a great job in taking all your medications. I appreciate it very much. Please continue doing that.  Please bring your medicines with you each time you come to clinic.  Medicines may include prescription medications, over-the-counter medications, herbal remedies, eye drops, vitamins, or other pills.   Progress Toward Treatment Goals:  Treatment Goal 03/12/2014  Hemoglobin A1C improved  Blood pressure deteriorated  Stop smoking smoking less    Self Care Goals & Plans:  Self Care Goal 03/12/2014  Manage my medications take my medicines as prescribed; bring my medications to every visit; refill my medications on time  Monitor my health -  Eat healthy foods drink diet soda or water instead of juice or soda; eat more vegetables; eat foods that are low in salt; eat baked foods instead of fried foods  Be physically active -  Stop smoking go to the [employer] ([email or web address]  Meeting treatment goals maintain the current self-care plan    Home Blood Glucose Monitoring 03/12/2014  Check my blood sugar 2 times a day  When to check my blood sugar before lunch; before breakfast     Care Management & Community Referrals:  Referral 03/12/2014  Referrals made for care management support nutritionist  Referrals made to community resources -

## 2014-03-12 NOTE — Assessment & Plan Note (Addendum)
Symptoms significantly improved, still w/ cough, not productive of sputum. Continues to take course of Doxycycline. Denies fever, chills, SOB, chest pain.  -Given Robitussin AC for sleep.

## 2014-03-12 NOTE — Progress Notes (Signed)
Subjective:   Patient ID: Bridget Owens female   DOB: Jul 08, 1956 58 y.o.   MRN: [id number]  HPI: Bridget Owens is a 58 y.o. F w/ PMHx of DM type II, HTN, HLD, GERD, OA, and other co-morbidities, presents to clinic today for follow up visit. Bridget Owens was recently seen in the ED on 03/06/14 for acute bronchitis, sent home w/ Rx for Doxycycline. Symptoms improved according to the patient. Still w/ mild dry cough, but denies SOB, fever, chills, sore throat, nausea, vomiting, or abdominal pain.  Bridget. Owens presents w/ elevated BP today but admits that she has not refilled her BP medications in 2-3 days. She denies non-compliance w/ other medications. Denies headache, vision changes, dizziness, lightheadedness, weakness, or sensory losses.  The patient's DM seems to be well controlled on Amaryl 2 mg in AM + Metformin 2000 mg qd. Brought glucometer, average CBG's of 130's. Patient claims she has some symptoms of hypoglycemia on occasion, including tremulousness and mild lightheadedness, which resolves after a small snack. Lowest CBG recorded via glucometer was 73.   Past Medical History  Diagnosis Date  . Bell's palsy 01/2009    Left sided  . Type II diabetes mellitus   . Hypertension   . Hyperlipidemia   . Allergic rhinitis   . Intolerance, drug     Leg cramps on Lipitor  . GERD (gastroesophageal reflux disease)   . Uterine fibroid   . Chest pain     Exercise stress test negative 6/07  . Postmenopausal symptoms     Hot flashes, vaginal dryness, iritability, difficulty sleeping. as of 2005.  Marland Kitchen Insomnia   . External hemorrhoid   . Lipoma 12/11    Posterior neck.   . Osteoarthritis     Carpometacarpal joint of right thumb  . Trigger finger     Right thumb  . Sebaceous cyst of breast     Has been refered to derm.  . Adenomatous colon polyp 6/09    Resected on colonoscopy, no high grade dysplasia.   . Hypertensive retinopathy     Followed by Dr. Herbert Deaner.  . Diabetic peripheral neuropathy    . Postmenopausal bleeding 9/05    Endometrial biopsy showed  FOCAL TUBAL METAPLASIA   . Colon polyps     03/22/2008: adenomatous polyps x3 w no dysplasia. Needs repeat colonoscopy in 3 years   Current Outpatient Prescriptions  Medication Sig Dispense Refill  . albuterol (PROVENTIL HFA;VENTOLIN HFA) 108 (90 BASE) MCG/ACT inhaler Inhale 1-2 puffs into the lungs every 6 (six) hours as needed for wheezing or shortness of breath.  1 Inhaler  0  . aspirin 81 MG EC tablet Take 81 mg by mouth daily.        Marland Kitchen atenolol-chlorthalidone (TENORETIC) 50-25 MG per tablet Take 1 tablet by mouth daily.  90 tablet  4  . atorvastatin (LIPITOR) 40 MG tablet Take 1 tablet (40 mg total) by mouth daily.  30 tablet  11  . calamine lotion Apply 1 application topically as needed for itching.  120 mL  0  . doxycycline (VIBRAMYCIN) 100 MG capsule Take 1 capsule (100 mg total) by mouth 2 (two) times daily.  20 capsule  0  . fluconazole (DIFLUCAN) 150 MG tablet Take 1 tablet (150 mg total) by mouth daily.  1 tablet  0  . glimepiride (AMARYL) 2 MG tablet Take 1 tablet (2 mg total) by mouth every morning.  30 tablet  5  . guaiFENesin-codeine 100-10 MG/5ML syrup Take 10  mLs by mouth 3 (three) times daily as needed for cough.  120 mL  0  . hydrOXYzine (ATARAX/VISTARIL) 10 MG tablet Take 1 tablet (10 mg total) by mouth 3 (three) times daily as needed.  30 tablet  2  . ibuprofen (ADVIL,MOTRIN) 800 MG tablet Take 1 tablet (800 mg total) by mouth every 8 (eight) hours as needed.  15 tablet  0  . losartan (COZAAR) 100 MG tablet Take 1 tablet (100 mg total) by mouth daily.  30 tablet  5  . metFORMIN (GLUMETZA) 1000 MG (MOD) 24 hr tablet Take 2 tablets (2,000 mg total) by mouth daily with breakfast.  60 tablet  5  . oxyCODONE (ROXICODONE) 5 MG immediate release tablet Take 1 tablet (5 mg total) by mouth every 6 (six) hours as needed.  10 tablet  0  . Polyethyl Glycol-Propyl Glycol (SYSTANE OP) Place 1 drop into both eyes 3 (three)  times daily.      Vladimir Faster Glycol-Propyl Glycol (SYSTANE) 0.4-0.3 % GEL Place 1 application into both eyes at bedtime.      . traMADol (ULTRAM) 50 MG tablet Take 1 tablet (50 mg total) by mouth every 6 (six) hours as needed for moderate pain.  15 tablet  0  . [DISCONTINUED] pantoprazole (PROTONIX) 20 MG tablet Take 1 tablet (20 mg total) by mouth daily.  30 tablet  0   No current facility-administered medications for this visit.   Family History  Problem Relation Age of Onset  . Pneumonia Mother   . Diabetes Mother   . Early death Father   . Diabetes Sister   . Diabetes Brother   . Diabetes Maternal Grandmother    History   Social History  . Marital Status: Single    Spouse Name: N/A    Number of Children: N/A  . Years of Education: N/A   Social History Main Topics  . Smoking status: Current Every Day Smoker -- 0.30 packs/day for 30 years    Types: Cigarettes  . Smokeless tobacco: Former Systems developer    Quit date: 11/28/2010     Comment: trying to quit .  Doing a little less now.  . Alcohol Use: No  . Drug Use: No  . Sexual Activity: None   Other Topics Concern  . None   Social History Narrative  . None   Review of Systems: General: Denies fever, chills, diaphoresis, appetite change and fatigue.  Respiratory: Positive for cough. Denies SOB, DOE, chest tightness, and wheezing.   Cardiovascular: Denies chest pain, palpitations and leg swelling.  Gastrointestinal: Denies nausea, vomiting, abdominal pain, diarrhea, constipation, blood in stool and abdominal distention.  Genitourinary: Denies dysuria, urgency, frequency, hematuria, flank pain and difficulty urinating.  Endocrine: Denies hot or cold intolerance, sweats, polyuria, polydipsia. Musculoskeletal: Denies myalgias, back pain, joint swelling, and gait problem.  Skin: Denies pallor, rash and wounds.  Neurological: Positive for mild headache. Denies dizziness, lightheadedness, seizures, syncope, weakness,  numbness. Psychiatric/Behavioral: Denies mood changes, confusion, nervousness, sleep disturbance and agitation.  Objective:   Physical Exam: Filed Vitals:   03/12/14 1516  BP: 163/90  Pulse: 78  Temp: 97 F (36.1 C)  TempSrc: Oral  Weight: 179 lb 11.2 oz (81.511 kg)  SpO2: 98%   General: Vital signs reviewed. Patient is a well-developed and well-nourished, in no acute distress and cooperative with exam.  Head: Normocephalic and atraumatic.  Eyes: PERRL, EOMI, conjunctivae normal, No scleral icterus.  Neck: Supple, trachea midline, normal ROM, No JVD, masses, thyromegaly, or  carotid bruit present.  Cardiovascular: RRR, S1 normal, S2 normal, no murmurs, gallops, or rubs. Pulmonary/Chest: Normal respiratory effort, CTAB, no wheezes, rales, or rhonchi. Abdominal: Soft, non-tender, non-distended, bowel sounds are normal, no masses, organomegaly, or guarding present.  Musculoskeletal: No joint deformities, erythema, or stiffness, ROM full and no nontender. Extremities: No swelling or edema,  pulses symmetric and intact bilaterally. No cyanosis or clubbing.  Neurological: A&O x3, Strength is normal and symmetric bilaterally, cranial nerve II-XII are grossly intact, no focal motor deficit, sensory intact to light touch bilaterally.  Skin: Warm, dry and intact. No rashes or erythema. Psychiatric: Normal mood and affect. speech and behavior is normal. Cognition and memory are normal.   Assessment & Plan:   Please see problem-based assessment and plan.

## 2014-03-12 NOTE — Assessment & Plan Note (Addendum)
Lab Results  Component Value Date   HGBA1C 10.6 01/11/2014   HGBA1C 11.6 10/29/2013   HGBA1C 5.8 07/03/2013     Assessment: Diabetes control: poor control (HgbA1C >9%) Progress toward A1C goal:  improved Comments: Brought glucometer, average CBG's in the 130's, lowest 73, highest, 220. Patient compliant w/ medications as listed below.   Plan: Medications:  continue current medications; Amaryl 2 mg in AM + Metformin 2000 mg po qd Home glucose monitoring: Frequency: 2 times a day Timing: before lunch;before breakfast Instruction/counseling given: reminded to bring blood glucose meter & log to each visit Educational resources provided: brochure Self management tools provided: copy of home glucose meter download Other plans: Return to clinic in 6 weeks for HbA1c check.

## 2014-03-15 ENCOUNTER — Encounter: Payer: Self-pay | Admitting: Internal Medicine

## 2014-03-17 ENCOUNTER — Encounter: Payer: Medicaid Other | Admitting: Internal Medicine

## 2014-03-18 NOTE — Progress Notes (Signed)
Case discussed with Dr. Moragne at the time of the visit.  We reviewed the resident's history and exam and pertinent patient test results.  I agree with the assessment, diagnosis, and plan of care documented in the resident's note. 

## 2014-03-19 ENCOUNTER — Encounter: Payer: Medicaid Other | Admitting: Internal Medicine

## 2014-04-26 ENCOUNTER — Telehealth: Payer: Self-pay | Admitting: *Deleted

## 2014-04-26 ENCOUNTER — Other Ambulatory Visit: Payer: Self-pay | Admitting: Internal Medicine

## 2014-04-26 DIAGNOSIS — E1159 Type 2 diabetes mellitus with other circulatory complications: Secondary | ICD-10-CM

## 2014-04-26 NOTE — Telephone Encounter (Signed)
RETURNED CALL TO PATIENT. PATIENT REQUESTING REFERRAL TO PODIATRY. HER DR. NO LONGER TAKES MEDICAID. WILL NEED NEW REFERRAL.  IN BASKET SENT TO DR. Ronnald Ramp, [place].  LELA STURDIVANT NTII [phone].  3:13PM

## 2014-05-25 ENCOUNTER — Ambulatory Visit: Payer: Medicaid Other | Admitting: Internal Medicine

## 2014-05-28 ENCOUNTER — Ambulatory Visit: Payer: Medicaid Other | Admitting: Internal Medicine

## 2014-05-28 ENCOUNTER — Encounter: Payer: Self-pay | Admitting: Internal Medicine

## 2014-05-28 ENCOUNTER — Ambulatory Visit (INDEPENDENT_AMBULATORY_CARE_PROVIDER_SITE_OTHER): Payer: Medicaid Other | Admitting: Internal Medicine

## 2014-05-28 VITALS — BP 139/84 | HR 65 | Temp 97.0°F | Wt 174.8 lb

## 2014-05-28 DIAGNOSIS — B373 Candidiasis of vulva and vagina: Secondary | ICD-10-CM

## 2014-05-28 DIAGNOSIS — Z0000 Encounter for general adult medical examination without abnormal findings: Secondary | ICD-10-CM

## 2014-05-28 DIAGNOSIS — B3731 Acute candidiasis of vulva and vagina: Secondary | ICD-10-CM

## 2014-05-28 DIAGNOSIS — E1159 Type 2 diabetes mellitus with other circulatory complications: Secondary | ICD-10-CM

## 2014-05-28 DIAGNOSIS — H9201 Otalgia, right ear: Secondary | ICD-10-CM

## 2014-05-28 DIAGNOSIS — H9209 Otalgia, unspecified ear: Secondary | ICD-10-CM

## 2014-05-28 DIAGNOSIS — I10 Essential (primary) hypertension: Secondary | ICD-10-CM

## 2014-05-28 LAB — GLUCOSE, CAPILLARY: Glucose-Capillary: 178 mg/dL — ABNORMAL HIGH (ref 70–99)

## 2014-05-28 LAB — POCT GLYCOSYLATED HEMOGLOBIN (HGB A1C): Hemoglobin A1C: 7

## 2014-05-28 MED ORDER — AMOXICILLIN 500 MG PO TABS: 500.0000 mg | ORAL_TABLET | Freq: Two times a day (BID) | ORAL | Status: DC

## 2014-05-28 MED ORDER — FLUCONAZOLE 150 MG PO TABS: 150.0000 mg | ORAL_TABLET | Freq: Every day | ORAL | Status: DC

## 2014-05-28 NOTE — Patient Instructions (Signed)
-  Take amoxicillin 500 mg twice a day for 5 days for your right ear pain -Take diflucan once for your yeast infection -Will see you back in 1 week to make sure your infection is better   General Instructions:   Please bring your medicines with you each time you come to clinic.  Medicines may include prescription medications, over-the-counter medications, herbal remedies, eye drops, vitamins, or other pills.   Progress Toward Treatment Goals:  Treatment Goal 03/12/2014  Hemoglobin A1C improved  Blood pressure deteriorated  Stop smoking smoking less    Self Care Goals & Plans:  Self Care Goal 03/12/2014  Manage my medications take my medicines as prescribed; bring my medications to every visit; refill my medications on time  Monitor my health -  Eat healthy foods drink diet soda or water instead of juice or soda; eat more vegetables; eat foods that are low in salt; eat baked foods instead of fried foods  Be physically active -  Stop smoking go to the [employer] ([email or web address]  Meeting treatment goals maintain the current self-care plan    Home Blood Glucose Monitoring 03/12/2014  Check my blood sugar 2 times a day  When to check my blood sugar before lunch; before breakfast     Care Management & Community Referrals:  Referral 03/12/2014  Referrals made for care management support nutritionist  Referrals made to community resources -

## 2014-05-28 NOTE — Progress Notes (Signed)
Patient ID: Bridget Owens, female   DOB: 1955/12/13, 58 y.o.   MRN: [id number]    Subjective:   Patient ID: Bridget Owens female   DOB: 1956-02-01 58 y.o.   MRN: [id number]  HPI: Bridget Owens is a 58 y.o. woman with past medical history of Bell's palsy, non-insulin dependent Type II DM, hypertension, hyperlipidemia, osteoarthritis, allergic rhinitis, and GERD who presents with chief complaint of vaginal itching and right ear pain.   She also has been having right ear pain for the past 2 weeks which she describes as located in the inside of her ear without fever/chills, drainage/vesicles, tinnitus, hearing loss, vertigo, facial pain/tenderness/weakness/droop, nasal congestion worse than normal (has perineal allergic rhinitis), sore throat, dysphagia, or vision change. She reports a mild headache and swollen gums (since the ear pain started) as well. She denies recent dental procedure or jaw/tooth pain. She denies placing anything in her ear or swimming recently. She has a history of Bell's Palsy in 2010 (left sided?) and then February of 2014 (right sided) which presented with right ear pain and progressed to right facial droop. She was hospitalized at that time and treated with ant-viral therapy (acyclovir) and corticosteroid therapy (prednisone) for herpes zoster reactivation. She has also been treated with antibiotics for otitis media in the past (May 2012) and for acute bacterial sinusitis (June 2013).   She reports the vaginal itching with cottage cheese discharge without odor that began about two day ago and is similar to previous candidiasis infections she has had in the past. Her last episode was in April which resolved with fluconazole. She denies urinary symptoms, abdominal cramping, vaginal bleeding, or new soaps/douching/sexual partners.    Her last A1c was 10.6 on 01/11/14. She reports compliance with taking metformin 2000 mg daily and amaryl 2 mg daily. She checks her blood sugar three times  daily with average values in the 100's. She denies symptomatic hypoglycemia. She reports chronic polyuria and neuropathy but denies blurry vision, polydipsia, polyphagia, or foot ulcer/injury. She is to see podiatry soon. She does not follow a healthy diet or exercise much. She has lost weight recently.      Past Medical History  Diagnosis Date  . Bell's palsy 01/2009    Left sided  . Type II diabetes mellitus   . Hypertension   . Hyperlipidemia   . Allergic rhinitis   . Intolerance, drug     Leg cramps on Lipitor  . GERD (gastroesophageal reflux disease)   . Uterine fibroid   . Chest pain     Exercise stress test negative 6/07  . Postmenopausal symptoms     Hot flashes, vaginal dryness, iritability, difficulty sleeping. as of 2005.  Marland Kitchen Insomnia   . External hemorrhoid   . Lipoma 12/11    Posterior neck.   . Osteoarthritis     Carpometacarpal joint of right thumb  . Trigger finger     Right thumb  . Sebaceous cyst of breast     Has been refered to derm.  . Adenomatous colon polyp 6/09    Resected on colonoscopy, no high grade dysplasia.   . Hypertensive retinopathy     Followed by Dr. Herbert Deaner.  . Diabetic peripheral neuropathy   . Postmenopausal bleeding 9/05    Endometrial biopsy showed  FOCAL TUBAL METAPLASIA   . Colon polyps     03/22/2008: adenomatous polyps x3 w no dysplasia. Needs repeat colonoscopy in 3 years   Current Outpatient Prescriptions  Medication Sig Dispense  Refill  . albuterol (PROVENTIL HFA;VENTOLIN HFA) 108 (90 BASE) MCG/ACT inhaler Inhale 1-2 puffs into the lungs every 6 (six) hours as needed for wheezing or shortness of breath.  1 Inhaler  0  . aspirin 81 MG EC tablet Take 81 mg by mouth daily.        Marland Kitchen atenolol-chlorthalidone (TENORETIC) 50-25 MG per tablet Take 1 tablet by mouth daily.  90 tablet  4  . atorvastatin (LIPITOR) 40 MG tablet Take 1 tablet (40 mg total) by mouth daily.  30 tablet  11  . calamine lotion Apply 1 application topically as  needed for itching.  120 mL  0  . doxycycline (VIBRAMYCIN) 100 MG capsule Take 1 capsule (100 mg total) by mouth 2 (two) times daily.  20 capsule  0  . fluconazole (DIFLUCAN) 150 MG tablet Take 1 tablet (150 mg total) by mouth daily.  1 tablet  0  . glimepiride (AMARYL) 2 MG tablet Take 1 tablet (2 mg total) by mouth every morning.  30 tablet  5  . guaiFENesin-codeine 100-10 MG/5ML syrup Take 10 mLs by mouth 3 (three) times daily as needed for cough.  120 mL  0  . hydrOXYzine (ATARAX/VISTARIL) 10 MG tablet Take 1 tablet (10 mg total) by mouth 3 (three) times daily as needed.  30 tablet  2  . ibuprofen (ADVIL,MOTRIN) 800 MG tablet Take 1 tablet (800 mg total) by mouth every 8 (eight) hours as needed.  15 tablet  0  . losartan (COZAAR) 100 MG tablet Take 1 tablet (100 mg total) by mouth daily.  30 tablet  5  . metFORMIN (GLUMETZA) 1000 MG (MOD) 24 hr tablet Take 2 tablets (2,000 mg total) by mouth daily with breakfast.  60 tablet  5  . oxyCODONE (ROXICODONE) 5 MG immediate release tablet Take 1 tablet (5 mg total) by mouth every 6 (six) hours as needed.  10 tablet  0  . Polyethyl Glycol-Propyl Glycol (SYSTANE OP) Place 1 drop into both eyes 3 (three) times daily.      Vladimir Faster Glycol-Propyl Glycol (SYSTANE) 0.4-0.3 % GEL Place 1 application into both eyes at bedtime.      . traMADol (ULTRAM) 50 MG tablet Take 1 tablet (50 mg total) by mouth every 6 (six) hours as needed for moderate pain.  15 tablet  0  . [DISCONTINUED] pantoprazole (PROTONIX) 20 MG tablet Take 1 tablet (20 mg total) by mouth daily.  30 tablet  0   No current facility-administered medications for this visit.   Family History  Problem Relation Age of Onset  . Pneumonia Mother   . Diabetes Mother   . Early death Father   . Diabetes Sister   . Diabetes Brother   . Diabetes Maternal Grandmother    History   Social History  . Marital Status: Single    Spouse Name: N/A    Number of Children: N/A  . Years of Education: N/A     Social History Main Topics  . Smoking status: Current Every Day Smoker -- 0.30 packs/day for 30 years    Types: Cigarettes  . Smokeless tobacco: Former Systems developer    Quit date: 11/28/2010     Comment: trying to quit .  Doing a little less now.  . Alcohol Use: No  . Drug Use: No  . Sexual Activity: None   Other Topics Concern  . None   Social History Narrative  . None   Review of Systems: Review of Systems  Constitutional:  Positive for weight loss. Negative for fever and chills.  HENT: Positive for ear pain (right for past 2 weeks). Negative for congestion, ear discharge, hearing loss, sore throat and tinnitus.   Eyes: Negative for blurred vision.  Respiratory: Positive for cough (chronic) and shortness of breath (chronic).   Cardiovascular: Negative for chest pain, palpitations and leg swelling.  Gastrointestinal: Negative for nausea, vomiting, abdominal pain, diarrhea and constipation.  Genitourinary: Negative for dysuria, urgency, frequency and hematuria.       Chronic polyuria Vaginal itching with white non-odorless discharge for 2 days  Musculoskeletal: Negative for neck pain.  Skin: Negative for rash.  Neurological: Positive for sensory change (chronic neuropathy) and headaches (mild). Negative for dizziness and focal weakness.  Endo/Heme/Allergies: Positive for environmental allergies. Negative for polydipsia.    Objective:  Physical Exam: Filed Vitals:   05/28/14 1512  BP: 139/84  Pulse: 65  Temp: 97 F (36.1 C)  TempSrc: Oral  Weight: 174 lb 12.8 oz (79.289 kg)  SpO2: 98%   Physical Exam  Constitutional: She is oriented to person, place, and time. She appears well-developed and well-nourished. No distress.  HENT:  Head: Normocephalic and atraumatic.  Right Ear: External ear normal.  Left Ear: External ear normal.  Nose: Nose normal.  Mouth/Throat: Oropharynx is clear and moist. No oropharyngeal exudate.  Right tympanic membrane non-bulging and  non-erythamatous with clear landmarks. Two small areas of white residue near tympanic membrane. No external vesicles.  Eyes: Conjunctivae and EOM are normal. Pupils are equal, round, and reactive to light. Right eye exhibits no discharge. Left eye exhibits no discharge. No scleral icterus.  Neck: Normal range of motion. Neck supple.  Cardiovascular: Normal rate, regular rhythm and normal heart sounds.   Pulmonary/Chest: Effort normal and breath sounds normal. No respiratory distress. She has no wheezes. She has no rales.  Abdominal: Soft. Bowel sounds are normal. She exhibits no distension. There is no tenderness. There is no rebound and no guarding.  Musculoskeletal: Normal range of motion. She exhibits no tenderness.  Neurological: She is alert and oriented to person, place, and time. No cranial nerve deficit.  No facial droop Normal sensation to light touch Normal 5/5 muscle strength  Skin: Skin is warm and dry. No rash noted. She is not diaphoretic. No erythema. No pallor.  Psychiatric: She has a normal mood and affect. Her behavior is normal. Judgment and thought content normal.    Assessment & Plan:   Please see problem list for problem-based assessment and plan

## 2014-05-30 DIAGNOSIS — H9203 Otalgia, bilateral: Secondary | ICD-10-CM | POA: Insufficient documentation

## 2014-05-30 NOTE — Assessment & Plan Note (Addendum)
Assessment: Pt with history of Bell's Palsy (2010) and recurrent herpes zoster of right ear (2014) with history of acute otitis media of right ear  who presents with 2-week history of right otalgia without other concerning symptoms most likely due to recurrent acute otitis media.   Plan: -Prescribe amoxicillin 500 mg BID for 5 days -Pt instructed to use OTC ibuprofen PRN pain -Pt to return in 1 week or sooner (if symptoms worsen or do not improve)

## 2014-05-30 NOTE — Assessment & Plan Note (Signed)
Assessment: Pt with moderate to well-controlled hypertension compliant with three-class (ARB, BB & diuretic) anti-hypertensive therapy who presents with blood pressure of 139/84.   Plan:  -BP 139/84 at goal <140/90 -Continue atenolol-chlorthalidone 50-25 mg daily and losartan 100 mg daily  -Last BMP normal on 03/05/14, repeat at next visit (declined today)

## 2014-05-30 NOTE — Assessment & Plan Note (Signed)
Assessment: Pt with last A1c of 10.6 on 01/11/14 compliant with dual oral hypoglycemic therapy with no recent symptomatic hypoglycemia who presents with CBG of 178 and significantly improved A1c of 7.   Plan:  -A1c 7 at goal of <7, continue metformin 2000 mg daily and glimepiride 2 mg daily (Pt congratulated on progress!) -BP 139/84 at goal <140/90, continue atenolol-chlorthalidone 50-25 mg daily and losartan 100 mg daily  -Obtain annual lipid panel at next visit (declined today), last LD 99 at goal <100, continue atorvastatin 40 mg daily  -Last annual eye exam on 01/01/13 with [facility], pt to schedule for follow-up appointment since overdue -Last annual foot exam on 08/13/13 -Last annual urine microalbumin on 06/13/13, consider obtaining at next visit to asseas effectiveness of ARB therapy -BMI 28.33 at goal <30 -Continue aspirin 81 mg daily for primary CVD prevention

## 2014-05-30 NOTE — Assessment & Plan Note (Signed)
Assessment: Pt with history of vaginal candidiasis who presents with similar symptoms as past episodes.   Plan:  -Pt declined vaginal examination -Pt could not provide urine sample for POCT dipstick   -Prescribe fluconazole 150 mg x 1 dose (with 1 refill if recurrent after antibiotic usage)

## 2014-05-30 NOTE — Assessment & Plan Note (Signed)
Inquire about zoster vaccination at next visit due to history of herpes zoster.

## 2014-06-01 ENCOUNTER — Ambulatory Visit: Payer: Medicaid Other

## 2014-06-01 NOTE — Progress Notes (Signed)
Internal Medicine Clinic Attending Date of visit: 05/28/2014  I saw and evaluated the patient.  I personally confirmed the key portions of the history and exam documented by Dr. Naaman Plummer and I reviewed pertinent patient test results.  The assessment, diagnosis, and plan were formulated together and I agree with the documentation in the resident's note.

## 2014-06-21 ENCOUNTER — Ambulatory Visit: Payer: Medicaid Other | Admitting: Podiatry

## 2014-06-21 NOTE — Addendum Note (Signed)
Addended by: Hulan Fray on: 06/21/2014 07:29 PM   Modules accepted: Orders

## 2014-06-29 ENCOUNTER — Encounter: Payer: Medicaid Other | Admitting: Internal Medicine

## 2014-06-30 ENCOUNTER — Ambulatory Visit (INDEPENDENT_AMBULATORY_CARE_PROVIDER_SITE_OTHER): Payer: Medicaid Other | Admitting: Internal Medicine

## 2014-06-30 ENCOUNTER — Encounter: Payer: Self-pay | Admitting: Internal Medicine

## 2014-06-30 VITALS — BP 130/76 | HR 64 | Temp 98.2°F | Ht 66.0 in | Wt 174.0 lb

## 2014-06-30 DIAGNOSIS — H9201 Otalgia, right ear: Secondary | ICD-10-CM

## 2014-06-30 DIAGNOSIS — L309 Dermatitis, unspecified: Secondary | ICD-10-CM

## 2014-06-30 DIAGNOSIS — H60391 Other infective otitis externa, right ear: Secondary | ICD-10-CM

## 2014-06-30 DIAGNOSIS — H9209 Otalgia, unspecified ear: Secondary | ICD-10-CM

## 2014-06-30 DIAGNOSIS — H60399 Other infective otitis externa, unspecified ear: Secondary | ICD-10-CM

## 2014-06-30 DIAGNOSIS — L259 Unspecified contact dermatitis, unspecified cause: Secondary | ICD-10-CM

## 2014-06-30 LAB — GLUCOSE, CAPILLARY: Glucose-Capillary: 205 mg/dL — ABNORMAL HIGH (ref 70–99)

## 2014-06-30 MED ORDER — OFLOXACIN 0.3 % OT SOLN: 10.0000 [drp] | Freq: Every day | OTIC | Status: DC

## 2014-06-30 MED ORDER — TRIAMCINOLONE ACETONIDE 0.025 % EX CREA: 1.0000 "application " | TOPICAL_CREAM | Freq: Two times a day (BID) | CUTANEOUS | Status: DC

## 2014-06-30 NOTE — Patient Instructions (Signed)
-  Start using the ofloxacin drops, 10 drops in the right ear once per day for 10 days.  -Start using triamcinolone cream twice per day for the rash on your shoulder.  -Start using mild soaps and lotion such as Eucerin to keep your skin moisturized.   -Follow up in 1 month with Dr. Ronnald Ramp for your diabetes and for blood pressure recheck.   Please bring your medicines with you each time you come.   Medicines may be  Eye drops  Herbal   Vitamins  Pills  Seeing these help [place] take care of you.

## 2014-07-01 DIAGNOSIS — H60399 Other infective otitis externa, unspecified ear: Secondary | ICD-10-CM | POA: Insufficient documentation

## 2014-07-01 DIAGNOSIS — L309 Dermatitis, unspecified: Secondary | ICD-10-CM | POA: Insufficient documentation

## 2014-07-01 NOTE — Assessment & Plan Note (Signed)
Red ear canal red, with no signs of trauma, pt denies using Qtips. Likely otitis externa.  Rx with ofloxacin 0.3% otic solution 10 drops daily in right ear for 10 days

## 2014-07-01 NOTE — Assessment & Plan Note (Signed)
She has dry skin which is c/w eczema.  Rx triamcinolone cream  Pt advised to use mild soaps and moisturizing lotions (such as Eucerin cream)

## 2014-07-01 NOTE — Progress Notes (Signed)
   Subjective:    Patient ID: Bridget Owens, female    DOB: 1955/11/12, 58 y.o.   MRN: [id number]  HPI Bridget Owens is a 58 y.o. woman with past medical history of Bell's palsy, non-insulin dependent Type II DM, hypertension, hyperlipidemia, osteoarthritis, allergic rhinitis, and GERD who presents for evaluation of right ear pain that is intermittent. She was treated with amoxicillin 500mg  BID for 5 days for acute otitis media last month but states that her right ear pain has worsened.  In addition she notes that he had a rash that started in her left shoulder a few days ago that is improving. The rash had pruritic papules that are now gone.    Review of Systems  Constitutional: Negative for fever, chills, diaphoresis, activity change, appetite change, fatigue and unexpected weight change.  HENT: Positive for ear pain. Negative for hearing loss.   Respiratory: Negative for cough.   Genitourinary: Negative for dysuria.  Skin: Positive for rash.  Neurological: Negative for dizziness, weakness and light-headedness.  Psychiatric/Behavioral: Negative for agitation.       Objective:   Physical Exam  Nursing note and vitals reviewed. Constitutional: She appears well-nourished. No distress.  HENT:  Left Ear: External ear normal.  Right ear canal with redness, no bleeding, normal TM with with no bulging   Cardiovascular: Normal rate.   Pulmonary/Chest: Effort normal. No respiratory distress. She has no wheezes. She has no rales.  Skin: Rash noted. She is not diaphoretic.  Macular rash at the left shoulder with ~5cm in diameter with darker discoloration and dry skin  Psychiatric: She has a normal mood and affect.          Assessment & Plan:

## 2014-07-02 ENCOUNTER — Telehealth: Payer: Self-pay | Admitting: *Deleted

## 2014-07-02 ENCOUNTER — Other Ambulatory Visit: Payer: Self-pay | Admitting: Internal Medicine

## 2014-07-02 DIAGNOSIS — H60391 Other infective otitis externa, right ear: Secondary | ICD-10-CM

## 2014-07-02 MED ORDER — OFLOXACIN 0.3 % OT SOLN: 10.0000 [drp] | Freq: Every day | OTIC | Status: DC

## 2014-07-02 NOTE — Progress Notes (Signed)
INTERNAL MEDICINE TEACHING ATTENDING ADDENDUM - Shanette Tamargo, MD: I reviewed and discussed at the time of visit with the resident Dr. Kennerly, the patient's medical history, physical examination, diagnosis and results of pertinent tests and treatment and I agree with the patient's care as documented.  

## 2014-07-02 NOTE — Telephone Encounter (Signed)
Pharmacy - [facility] [phone] -  Ear soln in back order till 08/2014 - Floxin otic soln. Message given to Dr Hayes Ludwig. Hilda Blades Karol Liendo RN 07/02/14 3:15PM

## 2014-07-09 NOTE — Telephone Encounter (Signed)
Done by Dr Hayes Ludwig 07/02/14.

## 2014-07-20 ENCOUNTER — Ambulatory Visit (INDEPENDENT_AMBULATORY_CARE_PROVIDER_SITE_OTHER): Payer: Medicaid Other | Admitting: Internal Medicine

## 2014-07-20 ENCOUNTER — Encounter: Payer: Self-pay | Admitting: Internal Medicine

## 2014-07-20 VITALS — BP 118/74 | HR 69 | Temp 97.9°F | Ht 66.0 in | Wt 177.3 lb

## 2014-07-20 DIAGNOSIS — I10 Essential (primary) hypertension: Secondary | ICD-10-CM

## 2014-07-20 DIAGNOSIS — Z0000 Encounter for general adult medical examination without abnormal findings: Secondary | ICD-10-CM

## 2014-07-20 DIAGNOSIS — E1165 Type 2 diabetes mellitus with hyperglycemia: Secondary | ICD-10-CM

## 2014-07-20 DIAGNOSIS — R232 Flushing: Secondary | ICD-10-CM

## 2014-07-20 DIAGNOSIS — H9201 Otalgia, right ear: Secondary | ICD-10-CM

## 2014-07-20 DIAGNOSIS — N951 Menopausal and female climacteric states: Secondary | ICD-10-CM

## 2014-07-20 LAB — POCT GLYCOSYLATED HEMOGLOBIN (HGB A1C): Hemoglobin A1C: 6.8

## 2014-07-20 LAB — GLUCOSE, CAPILLARY: Glucose-Capillary: 221 mg/dL — ABNORMAL HIGH (ref 70–99)

## 2014-07-20 NOTE — Assessment & Plan Note (Addendum)
Patient has not had a mammogram since 2009.  -Scheduled patient for mammogram.  -Refused Flu shot

## 2014-07-20 NOTE — Assessment & Plan Note (Signed)
Lab Results  Component Value Date   HGBA1C 6.8 07/20/2014   HGBA1C 7.0 05/28/2014   HGBA1C 10.6 01/11/2014     Assessment: Diabetes control: good control (HgbA1C at goal) Progress toward A1C goal:  at goal/improved Comments: Compliant w/ metformin 1000 mg bid + glimepiride 2 mg qAM. HbA1c continues to improve. No symptoms of hypoglycemia.   Plan: Medications:  continue current medications Home glucose monitoring: Frequency: once a day Timing: before breakfast Instruction/counseling given: discussed diet Educational resources provided: brochure Self management tools provided:   Other plans: RTC in 3 months

## 2014-07-20 NOTE — Assessment & Plan Note (Signed)
BP Readings from Last 3 Encounters:  07/20/14 118/74  06/30/14 130/76  05/28/14 139/84    Lab Results  Component Value Date   NA 139 03/05/2014   K 3.9 03/05/2014   CREATININE 0.76 03/05/2014    Assessment: Blood pressure control: controlled Progress toward BP goal:  at goal Comments: Compliant w/ Losartan 100 mg qd, Atenolol-Chlorthalidone 50-25 mg qd.   Plan: Medications:  continue current medications Educational resources provided: brochure Self management tools provided:   Other plans: RTC in 3 months.

## 2014-07-20 NOTE — Progress Notes (Signed)
Subjective:   Patient ID: Bridget Owens female   DOB: June 23, 1956 58 y.o.   MRN: [id number]  HPI: Bridget Owens is a 58 y.o. female w/ PMHx of HTN, DM type II, HLD, GERD, and hot flashes, presents to the clinic today for a follow-up visit. Patient was last seen in the clinic on 07/01/14 w/ complaints of right ear pain, treated for otitis externa. She still states that she has some mild right ear pain, but denies associated headaches, temporal tenderness, fever, tinnitus, hearing loss, or dizziness.  Her main complaint today is continued hot flashes, mostly at night, causing her difficulty with sleep. She states this has been a chronic issue for her but seems to have possibly become slightly worse, however, per chart review, these symptoms have been persistent and present since 2005. She otherwise denies any further complaints. No abdominal pain, nausea, vomiting, chills, diarrhea, or dysuria. She states she has been very compliant w/ her medications and her DM is well controlled, HbA1c of 6.8 today.   Past Medical History  Diagnosis Date  . Bell's palsy 01/2009    Left sided  . Type II diabetes mellitus   . Hypertension   . Hyperlipidemia   . Allergic rhinitis   . Intolerance, drug     Leg cramps on Lipitor  . GERD (gastroesophageal reflux disease)   . Uterine fibroid   . Chest pain     Exercise stress test negative 6/07  . Postmenopausal symptoms     Hot flashes, vaginal dryness, iritability, difficulty sleeping. as of 2005.  Marland Kitchen Insomnia   . External hemorrhoid   . Lipoma 12/11    Posterior neck.   . Osteoarthritis     Carpometacarpal joint of right thumb  . Trigger finger     Right thumb  . Sebaceous cyst of breast     Has been refered to derm.  . Adenomatous colon polyp 6/09    Resected on colonoscopy, no high grade dysplasia.   . Hypertensive retinopathy     Followed by Dr. Herbert Deaner.  . Diabetic peripheral neuropathy   . Postmenopausal bleeding 9/05    Endometrial biopsy  showed  FOCAL TUBAL METAPLASIA   . Colon polyps     03/22/2008: adenomatous polyps x3 w no dysplasia. Needs repeat colonoscopy in 3 years   Current Outpatient Prescriptions  Medication Sig Dispense Refill  . albuterol (PROVENTIL HFA;VENTOLIN HFA) 108 (90 BASE) MCG/ACT inhaler Inhale 1-2 puffs into the lungs every 6 (six) hours as needed for wheezing or shortness of breath.  1 Inhaler  0  . aspirin 81 MG EC tablet Take 81 mg by mouth daily.        Marland Kitchen atenolol-chlorthalidone (TENORETIC) 50-25 MG per tablet Take 1 tablet by mouth daily.  90 tablet  4  . atorvastatin (LIPITOR) 40 MG tablet Take 1 tablet (40 mg total) by mouth daily.  30 tablet  11  . calamine lotion Apply 1 application topically as needed for itching.  120 mL  0  . glimepiride (AMARYL) 2 MG tablet Take 1 tablet (2 mg total) by mouth every morning.  30 tablet  5  . guaiFENesin-codeine 100-10 MG/5ML syrup Take 10 mLs by mouth 3 (three) times daily as needed for cough.  120 mL  0  . hydrOXYzine (ATARAX/VISTARIL) 10 MG tablet Take 1 tablet (10 mg total) by mouth 3 (three) times daily as needed.  30 tablet  2  . losartan (COZAAR) 100 MG tablet Take 1  tablet (100 mg total) by mouth daily.  30 tablet  5  . metFORMIN (GLUMETZA) 1000 MG (MOD) 24 hr tablet Take 2 tablets (2,000 mg total) by mouth daily with breakfast.  60 tablet  5  . ofloxacin (FLOXIN) 0.3 % otic solution Place 10 drops into the right ear daily. For 10 days.  5 mL  1  . triamcinolone (KENALOG) 0.025 % cream Apply 1 application topically 2 (two) times daily. Apply to affected area twice daily  30 g  0  . [DISCONTINUED] pantoprazole (PROTONIX) 20 MG tablet Take 1 tablet (20 mg total) by mouth daily.  30 tablet  0   No current facility-administered medications for this visit.    Review of Systems: General: Positive for mild fatigue and poor appetite. Denies fever, chills, diaphoresis, appetite change.  Respiratory: Denies SOB, DOE, cough, and wheezing.   Cardiovascular:  Denies chest pain and palpitations.  Gastrointestinal: Denies nausea, vomiting, abdominal pain, and diarrhea.  Genitourinary: Denies dysuria, increased frequency, and flank pain. Endocrine: Positive for hot flashes. Denies polyuria, and polydipsia. Musculoskeletal: Denies myalgias, back pain, joint swelling, arthralgias and gait problem.  Skin: Denies pallor, rash and wounds.  Neurological: Denies dizziness, seizures, syncope, weakness, lightheadedness, numbness and headaches.  Psychiatric/Behavioral: Denies mood changes, and sleep disturbances.  Objective:   Physical Exam: Filed Vitals:   07/20/14 1435  BP: 118/74  Pulse: 69  Temp: 97.9 F (36.6 C)  TempSrc: Oral  Height: 5\' 6"  (1.676 m)  Weight: 177 lb 4.8 oz (80.423 kg)  SpO2: 98%   Physical Exam: General: Elderly AA female, alert, cooperative, NAD. HEENT: PERRL, EOMI. Moist mucus membranes. Mild conjunctival injection. No mastoid or temporal tenderness on exam. Ears w/out erythema or effusions. Normal cerumen.  Neck: Full range of motion without pain, supple, no lymphadenopathy or carotid bruits Lungs: Clear to ascultation bilaterally, normal work of respiration, no wheezes, rales, rhonchi Heart: RRR, no murmurs, gallops, or rubs Abdomen: Soft, non-tender, non-distended, BS + Extremities: No cyanosis, clubbing, or edema Neurologic: Alert & oriented X3, cranial nerves II-XII intact, strength grossly intact, sensation intact to light touch   Assessment & Plan:   Please see problem based assessment and plan.

## 2014-07-20 NOTE — Assessment & Plan Note (Signed)
This is a chronic issue for her but is her presenting complaint today. States that they are worse at night and affect her sleep. Of note, TSH within normal limits as of 02/2014, making thyroid dysfunction very unlikely.  -Discussed behavioral modifications for treatment including using a fan, thermostat adjustments, dressing in layers, etc. She claims she does most of these things but will try and regulate her temperature at night better.  -Could consider low dose Neurontin vs Effexor if continued moderate/severe symptoms. -Discussed hormone replacement at length and explained the risks of this type of therapy and that she is not a candidate given her history of smoking.

## 2014-07-20 NOTE — Assessment & Plan Note (Signed)
Still w/ mild right ear pain, but seems to be resolving since her last visit. Treated w/ ofloxacin ear drops. Ears appear clear, no erythema, effusions, or mastoid/temporal tenderness.

## 2014-07-20 NOTE — Patient Instructions (Addendum)
General Instructions:  1. Please schedule a follow up appointment for 3 months.  2. Please take all medications as prescribed.   PLEASE GET YOUR MAMMOGRAM.  3. If you have worsening of your symptoms or new symptoms arise, please call the clinic ([phone]), or go to the ER immediately if symptoms are severe.  You have done a great job in taking all your medications. I appreciate it very much. Please continue doing that.   Please bring your medicines with you each time you come to clinic.  Medicines may include prescription medications, over-the-counter medications, herbal remedies, eye drops, vitamins, or other pills.   Progress Toward Treatment Goals:  Treatment Goal 07/20/2014  Hemoglobin A1C at goal  Blood pressure at goal  Stop smoking smoking less    Self Care Goals & Plans:  Self Care Goal 07/20/2014  Manage my medications take my medicines as prescribed; bring my medications to every visit; refill my medications on time  Monitor my health -  Eat healthy foods drink diet soda or water instead of juice or soda; eat more vegetables; eat foods that are low in salt; eat baked foods instead of fried foods; eat fruit for snacks and desserts  Be physically active -  Stop smoking -  Meeting treatment goals maintain the current self-care plan    Home Blood Glucose Monitoring 07/20/2014  Check my blood sugar once a day  When to check my blood sugar before breakfast     Care Management & Community Referrals:  Referral 07/20/2014  Referrals made for care management support none needed  Referrals made to community resources -       Menopause/Hot Flashes Menopause is the normal time of life when menstrual periods stop completely. Menopause is complete when you have missed 12 consecutive menstrual periods. It usually occurs between the ages of 35 years and 60 years. Very rarely does a woman develop menopause before the age of 6 years. At menopause, your ovaries stop  producing the female hormones estrogen and progesterone. This can cause undesirable symptoms and also affect your health. Sometimes the symptoms may occur 4-5 years before the menopause begins. There is no relationship between menopause and:  Oral contraceptives.  Number of children you had.  Race.  The age your menstrual periods started (menarche). Heavy smokers and very thin women may develop menopause earlier in life. CAUSES  The ovaries stop producing the female hormones estrogen and progesterone.  Other causes include:  Surgery to remove both ovaries.  The ovaries stop functioning for no known reason.  Tumors of the pituitary gland in the brain.  Medical disease that affects the ovaries and hormone production.  Radiation treatment to the abdomen or pelvis.  Chemotherapy that affects the ovaries. SYMPTOMS   Hot flashes.  Night sweats.  Decrease in sex drive.  Vaginal dryness and thinning of the vagina causing painful intercourse.  Dryness of the skin and developing wrinkles.  Headaches.  Tiredness.  Irritability.  Memory problems.  Weight gain.  Bladder infections.  Hair growth of the face and chest.  Infertility. More serious symptoms include:  Loss of bone (osteoporosis) causing breaks (fractures).  Depression.  Hardening and narrowing of the arteries (atherosclerosis) causing heart attacks and strokes. DIAGNOSIS   When the menstrual periods have stopped for 12 straight months.  Physical exam.  Hormone studies of the blood. TREATMENT  There are many treatment choices and nearly as many questions about them. The decisions to treat or not to treat menopausal changes is  an individual choice made with your health care provider. Your health care provider can discuss the treatments with you. Together, you can decide which treatment will work best for you. Your treatment choices may include:   Hormone therapy (estrogen and  progesterone).  Non-hormonal medicines.  Treating the individual symptoms with medicine (for example antidepressants for depression).  Herbal medicines that may help specific symptoms.  Counseling by a psychiatrist or psychologist.  Group therapy.  Lifestyle changes including:  Eating healthy.  Regular exercise.  Limiting caffeine and alcohol.  Stress management and meditation.  No treatment. HOME CARE INSTRUCTIONS   Take the medicine your health care provider gives you as directed.  Get plenty of sleep and rest.  Exercise regularly.  Eat a diet that contains calcium (good for the bones) and soy products (acts like estrogen hormone).  Avoid alcoholic beverages.  Do not smoke.  If you have hot flashes, dress in layers.  Take supplements, calcium, and vitamin D to strengthen bones.  You can use over-the-counter lubricants or moisturizers for vaginal dryness.  Group therapy is sometimes very helpful.  Acupuncture may be helpful in some cases. SEEK MEDICAL CARE IF:   You are not sure you are in menopause.  You are having menopausal symptoms and need advice and treatment.  You are still having menstrual periods after age 44 years.  You have pain with intercourse.  Menopause is complete (no menstrual period for 12 months) and you develop vaginal bleeding.  You need a referral to a specialist (gynecologist, psychiatrist, or psychologist) for treatment. SEEK IMMEDIATE MEDICAL CARE IF:   You have severe depression.  You have excessive vaginal bleeding.  You fell and think you have a broken bone.  You have pain when you urinate.  You develop leg or chest pain.  You have a fast pounding heart beat (palpitations).  You have severe headaches.  You develop vision problems.  You feel a lump in your breast.  You have abdominal pain or severe indigestion. Document Released: 12/15/2003 Document Revised: 05/27/2013 Document Reviewed: 04/23/2013 [facility]  Patient Information 2015 [place], [place]. This information is not intended to replace advice given to you by your health care provider. Make sure you discuss any questions you have with your health care provider.

## 2014-07-21 ENCOUNTER — Other Ambulatory Visit: Payer: Self-pay | Admitting: Internal Medicine

## 2014-07-21 DIAGNOSIS — Z1239 Encounter for other screening for malignant neoplasm of breast: Secondary | ICD-10-CM

## 2014-07-21 NOTE — Progress Notes (Signed)
INTERNAL MEDICINE TEACHING ATTENDING ADDENDUM - Caelan Atchley, MD: I reviewed and discussed at the time of visit with the resident Dr. Koning, the patient's medical history, physical examination, diagnosis and results of pertinent tests and treatment and I agree with the patient's care as documented.  

## 2014-07-24 ENCOUNTER — Emergency Department (HOSPITAL_COMMUNITY)
Admission: EM | Admit: 2014-07-24 | Discharge: 2014-07-24 | Disposition: A | Discharge status: Home / Self Care | Payer: Medicaid Other | Attending: Emergency Medicine | Admitting: Emergency Medicine

## 2014-07-24 ENCOUNTER — Emergency Department (HOSPITAL_COMMUNITY): Payer: Medicaid Other

## 2014-07-24 ENCOUNTER — Encounter (HOSPITAL_COMMUNITY): Payer: Self-pay | Admitting: Emergency Medicine

## 2014-07-24 DIAGNOSIS — Z7982 Long term (current) use of aspirin: Secondary | ICD-10-CM | POA: Insufficient documentation

## 2014-07-24 DIAGNOSIS — H35039 Hypertensive retinopathy, unspecified eye: Secondary | ICD-10-CM | POA: Diagnosis not present

## 2014-07-24 DIAGNOSIS — G629 Polyneuropathy, unspecified: Secondary | ICD-10-CM | POA: Diagnosis not present

## 2014-07-24 DIAGNOSIS — J069 Acute upper respiratory infection, unspecified: Secondary | ICD-10-CM | POA: Diagnosis not present

## 2014-07-24 DIAGNOSIS — E1342 Other specified diabetes mellitus with diabetic polyneuropathy: Secondary | ICD-10-CM | POA: Diagnosis not present

## 2014-07-24 DIAGNOSIS — Z86010 Personal history of colonic polyps: Secondary | ICD-10-CM | POA: Diagnosis not present

## 2014-07-24 DIAGNOSIS — Z720 Tobacco use: Secondary | ICD-10-CM

## 2014-07-24 DIAGNOSIS — Z8742 Personal history of other diseases of the female genital tract: Secondary | ICD-10-CM | POA: Insufficient documentation

## 2014-07-24 DIAGNOSIS — E785 Hyperlipidemia, unspecified: Secondary | ICD-10-CM | POA: Diagnosis not present

## 2014-07-24 DIAGNOSIS — Z79899 Other long term (current) drug therapy: Secondary | ICD-10-CM | POA: Insufficient documentation

## 2014-07-24 DIAGNOSIS — R05 Cough: Secondary | ICD-10-CM | POA: Diagnosis present

## 2014-07-24 DIAGNOSIS — Z8669 Personal history of other diseases of the nervous system and sense organs: Secondary | ICD-10-CM | POA: Insufficient documentation

## 2014-07-24 DIAGNOSIS — M1990 Unspecified osteoarthritis, unspecified site: Secondary | ICD-10-CM | POA: Insufficient documentation

## 2014-07-24 DIAGNOSIS — Z8719 Personal history of other diseases of the digestive system: Secondary | ICD-10-CM | POA: Diagnosis not present

## 2014-07-24 DIAGNOSIS — J22 Unspecified acute lower respiratory infection: Secondary | ICD-10-CM

## 2014-07-24 LAB — RAPID STREP SCREEN (MED CTR MEBANE ONLY): Streptococcus, Group A Screen (Direct): NEGATIVE

## 2014-07-24 MED ORDER — ALBUTEROL SULFATE HFA 108 (90 BASE) MCG/ACT IN AERS: 2.0000 | INHALATION_SPRAY | RESPIRATORY_TRACT | Status: DC | PRN

## 2014-07-24 MED ORDER — HYDROCODONE-HOMATROPINE 5-1.5 MG/5ML PO SYRP: 5.0000 mL | ORAL_SOLUTION | Freq: Four times a day (QID) | ORAL | Status: DC | PRN

## 2014-07-24 MED ORDER — ALBUTEROL SULFATE HFA 108 (90 BASE) MCG/ACT IN AERS: 1.0000 | INHALATION_SPRAY | Freq: Four times a day (QID) | RESPIRATORY_TRACT | Status: DC | PRN

## 2014-07-24 NOTE — ED Provider Notes (Signed)
CSN: [id number]     Arrival date & time 07/24/14  1622 History  This chart was scribed for non-physician practitioner, Margarita Mail, PA-C,working with Richarda Blade, MD, by Marlowe Kays, ED Scribe. This patient was seen in room TR06C/TR06C and the patient's care was started at 7:26 PM.  Chief Complaint  Patient presents with  . Cough  . Sore Throat   Patient is a 58 y.o. female presenting with cough and pharyngitis. The history is provided by the patient. No language interpreter was used.  Cough Associated symptoms: rhinorrhea and sore throat   Associated symptoms: no chills and no fever   Sore Throat   HPI Comments:  Bridget Owens is a 58 y.o. female with DM, HTN, and hyperlipidemia who presents to the Emergency Department complaining of a severe productive cough of white mucus and sore throat that began four days ago. Reports associated rhinorrhea. She states she went to a thrift shop five days ago and began experiencing the symptoms the next day. She states this happens every time she goes to a Advice worker. Denies taking any medication for her symptoms and states she is out of her MDI and nebulizer. She denies fever, chills, nausea or vomiting.  Past Medical History  Diagnosis Date  . Bell's palsy 01/2009    Left sided  . Type II diabetes mellitus   . Hypertension   . Hyperlipidemia   . Allergic rhinitis   . Intolerance, drug     Leg cramps on Lipitor  . GERD (gastroesophageal reflux disease)   . Uterine fibroid   . Chest pain     Exercise stress test negative 6/07  . Postmenopausal symptoms     Hot flashes, vaginal dryness, iritability, difficulty sleeping. as of 2005.  Marland Kitchen Insomnia   . External hemorrhoid   . Lipoma 12/11    Posterior neck.   . Osteoarthritis     Carpometacarpal joint of right thumb  . Trigger finger     Right thumb  . Sebaceous cyst of breast     Has been refered to derm.  . Adenomatous colon polyp 6/09    Resected on colonoscopy, no high  grade dysplasia.   . Hypertensive retinopathy     Followed by Dr. Herbert Deaner.  . Diabetic peripheral neuropathy   . Postmenopausal bleeding 9/05    Endometrial biopsy showed  FOCAL TUBAL METAPLASIA   . Colon polyps     03/22/2008: adenomatous polyps x3 w no dysplasia. Needs repeat colonoscopy in 3 years   Past Surgical History  Procedure Laterality Date  . Tubal ligation     Family History  Problem Relation Age of Onset  . Pneumonia Mother   . Diabetes Mother   . Early death Father   . Diabetes Sister   . Diabetes Brother   . Diabetes Maternal Grandmother    History  Substance Use Topics  . Smoking status: Current Every Day Smoker -- 0.30 packs/day for 30 years    Types: Cigarettes  . Smokeless tobacco: Former Systems developer    Quit date: 11/28/2010     Comment: trying to quit .  Doing a little less now.  . Alcohol Use: No   OB History   Grav Para Term Preterm Abortions TAB SAB Ect Mult Living                 Review of Systems  Constitutional: Negative for fever and chills.  HENT: Positive for rhinorrhea and sore throat.   Respiratory: Positive  for cough.   Gastrointestinal: Negative for nausea and vomiting.    Allergies  Codeine  Home Medications   Prior to Admission medications   Medication Sig Start Date End Date Taking? Authorizing Provider  albuterol (PROVENTIL HFA;VENTOLIN HFA) 108 (90 BASE) MCG/ACT inhaler Inhale 1-2 puffs into the lungs every 6 (six) hours as needed for wheezing or shortness of breath. 03/06/14  Yes Ezequiel Essex, MD  aspirin 81 MG EC tablet Take 81 mg by mouth daily.     Yes Historical Provider, MD  atenolol-chlorthalidone (TENORETIC) 50-25 MG per tablet Take 1 tablet by mouth daily. 10/29/13  Yes Tigue Bales, MD  atorvastatin (LIPITOR) 40 MG tablet Take 1 tablet (40 mg total) by mouth daily. 10/29/13 10/29/14 Yes Bovenzi Bales, MD  glimepiride (AMARYL) 2 MG tablet Take 1 tablet (2 mg total) by mouth every morning. 01/11/14 01/11/15 Yes Corky Sox, MD  guaiFENesin-codeine 100-10 MG/5ML syrup Take 10 mLs by mouth 3 (three) times daily as needed for cough. 03/12/14  Yes Corky Sox, MD  hydrOXYzine (ATARAX/VISTARIL) 10 MG tablet Take 10 mg by mouth 3 (three) times daily as needed for itching. 02/05/14  Yes Corky Sox, MD  losartan (COZAAR) 100 MG tablet Take 1 tablet (100 mg total) by mouth daily. 10/29/13  Yes Onley Bales, MD  metFORMIN (GLUMETZA) 1000 MG (MOD) 24 hr tablet Take 2 tablets (2,000 mg total) by mouth daily with breakfast. 03/12/14  Yes Corky Sox, MD   Triage Vitals: BP 157/78  Pulse 80  Temp(Src) 97.8 F (36.6 C) (Oral)  Resp 18  SpO2 98% Physical Exam  Nursing note and vitals reviewed. Constitutional: She is oriented to person, place, and time. She appears well-developed and well-nourished.  HENT:  Head: Normocephalic and atraumatic.  No pharyngeal hypertrophy. Mild and mild posterior pharyngeal erythema consistent postnasal drip.  Eyes: EOM are normal.  Neck: Normal range of motion.  Cardiovascular: Normal rate, regular rhythm and normal heart sounds.  Exam reveals no gallop and no friction rub.   No murmur heard. Pulmonary/Chest: Effort normal and breath sounds normal. No respiratory distress. She has no wheezes. She has no rales.  Musculoskeletal: Normal range of motion.  Neurological: She is alert and oriented to person, place, and time.  Skin: Skin is warm and dry.  Psychiatric: She has a normal mood and affect. Her behavior is normal.    ED Course  Procedures (including critical care time) DIAGNOSTIC STUDIES: Oxygen Saturation is 98% on RA, normal by my interpretation.   COORDINATION OF CARE: 7:33 PM- Will order MDI and Hycodan. verbalizes understanding and agrees to plan.  Medications - No data to display  Labs Review Labs Reviewed  RAPID STREP SCREEN  CULTURE, GROUP A STREP    Imaging Review Dg Chest 2 View  07/24/2014   CLINICAL DATA:  Cough and shortness of breath for 4 days.   EXAM: CHEST  2 VIEW  COMPARISON:  PA and lateral chest 03/05/2014.  FINDINGS: The lungs are clear. Heart size is normal. No pneumothorax or pleural effusion.  IMPRESSION: No acute disease.   Electronically Signed   By: Inge Rise M.D.   On: 07/24/2014 18:33     EKG Interpretation None      MDM   Final diagnoses:  URI (upper respiratory infection)  Chest cold  Tobacco abuse    Pt CXR negative for acute infiltrate. Patients symptoms are consistent with URI, likely viral etiology. Discussed that antibiotics are not indicated  for viral infections. Pt will be discharged with symptomatic treatment.  Verbalizes understanding and is agreeable with plan. Pt is hemodynamically stable & in NAD prior to dc.    I personally performed the services described in this documentation, which was scribed in my presence. The recorded information has been reviewed and is accurate.    Margarita Mail, PA-C 07/24/14 2002

## 2014-07-24 NOTE — Discharge Instructions (Signed)
Upper Respiratory Infection, Adult An upper respiratory infection (URI) is also sometimes known as the common cold. The upper respiratory tract includes the nose, sinuses, throat, trachea, and bronchi. Bronchi are the airways leading to the lungs. Most people improve within 1 week, but symptoms can last up to 2 weeks. A residual cough may last even longer.  CAUSES Many different viruses can infect the tissues lining the upper respiratory tract. The tissues become irritated and inflamed and often become very moist. Mucus production is also common. A cold is contagious. You can easily spread the virus to others by oral contact. This includes kissing, sharing a glass, coughing, or sneezing. Touching your mouth or nose and then touching a surface, which is then touched by another person, can also spread the virus. SYMPTOMS  Symptoms typically develop 1 to 3 days after you come in contact with a cold virus. Symptoms vary from person to person. They may include:  Runny nose.  Sneezing.  Nasal congestion.  Sinus irritation.  Sore throat.  Loss of voice (laryngitis).  Cough.  Fatigue.  Muscle aches.  Loss of appetite.  Headache.  Low-grade fever. DIAGNOSIS  You might diagnose your own cold based on familiar symptoms, since most people get a cold 2 to 3 times a year. Your caregiver can confirm this based on your exam. Most importantly, your caregiver can check that your symptoms are not due to another disease such as strep throat, sinusitis, pneumonia, asthma, or epiglottitis. Blood tests, throat tests, and X-rays are not necessary to diagnose a common cold, but they may sometimes be helpful in excluding other more serious diseases. Your caregiver will decide if any further tests are required. RISKS AND COMPLICATIONS  You may be at risk for a more severe case of the common cold if you smoke cigarettes, have chronic heart disease (such as heart failure) or lung disease (such as asthma), or if  you have a weakened immune system. The very young and very old are also at risk for more serious infections. Bacterial sinusitis, middle ear infections, and bacterial pneumonia can complicate the common cold. The common cold can worsen asthma and chronic obstructive pulmonary disease (COPD). Sometimes, these complications can require emergency medical care and may be life-threatening. PREVENTION  The best way to protect against getting a cold is to practice good hygiene. Avoid oral or hand contact with people with cold symptoms. Wash your hands often if contact occurs. There is no clear evidence that vitamin C, vitamin E, echinacea, or exercise reduces the chance of developing a cold. However, it is always recommended to get plenty of rest and practice good nutrition. TREATMENT  Treatment is directed at relieving symptoms. There is no cure. Antibiotics are not effective, because the infection is caused by a virus, not by bacteria. Treatment may include:  Increased fluid intake. Sports drinks offer valuable electrolytes, sugars, and fluids.  Breathing heated mist or steam (vaporizer or shower).  Eating chicken soup or other clear broths, and maintaining good nutrition.  Getting plenty of rest.  Using gargles or lozenges for comfort.  Controlling fevers with ibuprofen or acetaminophen as directed by your caregiver.  Increasing usage of your inhaler if you have asthma. Zinc gel and zinc lozenges, taken in the first 24 hours of the common cold, can shorten the duration and lessen the severity of symptoms. Pain medicines may help with fever, muscle aches, and throat pain. A variety of non-prescription medicines are available to treat congestion and runny nose. Your caregiver   can make recommendations and may suggest nasal or lung inhalers for other symptoms.  HOME CARE INSTRUCTIONS   Only take over-the-counter or prescription medicines for pain, discomfort, or fever as directed by your  caregiver.  Use a warm mist humidifier or inhale steam from a shower to increase air moisture. This may keep secretions moist and make it easier to breathe.  Drink enough water and fluids to keep your urine clear or pale yellow.  Rest as needed.  Return to work when your temperature has returned to normal or as your caregiver advises. You may need to stay home longer to avoid infecting others. You can also use a face mask and careful hand washing to prevent spread of the virus. SEEK MEDICAL CARE IF:   After the first few days, you feel you are getting worse rather than better.  You need your caregiver's advice about medicines to control symptoms.  You develop chills, worsening shortness of breath, or brown or red sputum. These may be signs of pneumonia.  You develop yellow or brown nasal discharge or pain in the face, especially when you bend forward. These may be signs of sinusitis.  You develop a fever, swollen neck glands, pain with swallowing, or white areas in the back of your throat. These may be signs of strep throat. SEEK IMMEDIATE MEDICAL CARE IF:   You have a fever.  You develop severe or persistent headache, ear pain, sinus pain, or chest pain.  You develop wheezing, a prolonged cough, cough up blood, or have a change in your usual mucus (if you have chronic lung disease).  You develop sore muscles or a stiff neck. Document Released: 03/20/2001 Document Revised: 12/17/2011 Document Reviewed: 12/30/2013 [employer] Patient Information 2015 [employer]. This information is not intended to replace advice given to you by your health care provider. Make sure you discuss any questions you have with your health care provider.  

## 2014-07-24 NOTE — ED Notes (Signed)
Pt reports to ED stating "I think my bronchitis is acting up." Pt states she has had a cough with white mucous, and a sore throat since Tuesday. Pt states she has a hx of bronchitis and "this feels like this." Pt denies SOB, CP, and fever/chills.

## 2014-07-25 NOTE — ED Provider Notes (Signed)
Medical screening examination/treatment/procedure(s) were performed by non-physician practitioner and as supervising physician I was immediately available for consultation/collaboration.  Richarda Blade, MD 07/25/14 [phone]

## 2014-07-26 LAB — CULTURE, GROUP A STREP

## 2014-07-27 ENCOUNTER — Ambulatory Visit (INDEPENDENT_AMBULATORY_CARE_PROVIDER_SITE_OTHER): Payer: Medicaid Other | Admitting: Internal Medicine

## 2014-07-27 ENCOUNTER — Encounter: Payer: Self-pay | Admitting: Internal Medicine

## 2014-07-27 VITALS — BP 183/85 | HR 74 | Temp 97.8°F | Ht 66.0 in | Wt 177.3 lb

## 2014-07-27 DIAGNOSIS — J069 Acute upper respiratory infection, unspecified: Secondary | ICD-10-CM

## 2014-07-27 DIAGNOSIS — I10 Essential (primary) hypertension: Secondary | ICD-10-CM

## 2014-07-27 LAB — GLUCOSE, CAPILLARY: Glucose-Capillary: 178 mg/dL — ABNORMAL HIGH (ref 70–99)

## 2014-07-27 MED ORDER — GUAIFENESIN-CODEINE 100-10 MG/5ML PO SYRP: 5.0000 mL | ORAL_SOLUTION | Freq: Three times a day (TID) | ORAL | Status: DC | PRN

## 2014-07-27 NOTE — Patient Instructions (Signed)
General Instructions:  1. Please schedule a follow up appointment for 3 months.  2. Please take all medications as prescribed. Take your BP MEDS.   3. If you have worsening of your symptoms or new symptoms arise, please call the clinic ([phone]), or go to the ER immediately if symptoms are severe.    Please bring your medicines with you each time you come to clinic.  Medicines may include prescription medications, over-the-counter medications, herbal remedies, eye drops, vitamins, or other pills.   Progress Toward Treatment Goals:  Treatment Goal 07/27/2014  Hemoglobin A1C at goal  Blood pressure deteriorated  Stop smoking smoking the same amount    Self Care Goals & Plans:  Self Care Goal 07/27/2014  Manage my medications take my medicines as prescribed; refill my medications on time; bring my medications to every visit  Monitor my health -  Eat healthy foods drink diet soda or water instead of juice or soda; eat more vegetables; eat foods that are low in salt; eat baked foods instead of fried foods; eat fruit for snacks and desserts  Be physically active -  Stop smoking -  Meeting treatment goals maintain the current self-care plan    Home Blood Glucose Monitoring 07/27/2014  Check my blood sugar once a day  When to check my blood sugar before breakfast     Care Management & Community Referrals:  Referral 07/27/2014  Referrals made for care management support none needed  Referrals made to community resources none

## 2014-07-27 NOTE — Assessment & Plan Note (Addendum)
Patient seen in the ED on 07/24/14 for cough, sore throat, congestion, rhinorrhea, and general malaise. Rapid strep and throat culture negative. CXR w/ NACPD. On exam, lungs sound clear, no wheezes or crackles. Air entry clear and equal bilaterally. Patient does have a cough, has taken no medication for this. No fever on exam, feel this is likely a viral upper respiratory tract infection.  -Robitussin AC for cough/congestion -Tylenol prn for body aches/pain -Emphasized smoking cessation. Declines patches or gum today. Given reading material for smoking cessation.

## 2014-07-27 NOTE — Progress Notes (Signed)
Patient ID: Bridget Owens, female   DOB: [birth year], 58 y.o.   MRN: [id number]   Subjective:   Patient ID: Bridget Owens female   DOB: 07-Aug-1956 58 y.o.   MRN: [id number]  HPI: Bridget Owens is a 58 y.o. female w/ PMHx of HTN, DM type II, HLD, GERD, and hot flashes, presents to the clinic today for a follow-up visit. Patient was seen in the ED on 07/24/14 w/ complaints of URI symptoms. She recently started having sore throat, cough, congestion, rhinorrhea, and mild wheezing at home. She denies fever, chills, nausea, diarrhea, chest pain, significant SOB, or hemoptysis. Rapid Strep and throat culture were found to be negative, CXR w/ NACPD. Se claims that she is starting to feel somewhat better but is still having a cough that is disrupting her sleep. She continues to smoke, but says she would like to quit "in the near future".  No further complaints.   Past Medical History  Diagnosis Date  . Bell's palsy 01/2009    Left sided  . Type II diabetes mellitus   . Hypertension   . Hyperlipidemia   . Allergic rhinitis   . Intolerance, drug     Leg cramps on Lipitor  . GERD (gastroesophageal reflux disease)   . Uterine fibroid   . Chest pain     Exercise stress test negative 6/07  . Postmenopausal symptoms     Hot flashes, vaginal dryness, iritability, difficulty sleeping. as of 2005.  Marland Kitchen Insomnia   . External hemorrhoid   . Lipoma 12/11    Posterior neck.   . Osteoarthritis     Carpometacarpal joint of right thumb  . Trigger finger     Right thumb  . Sebaceous cyst of breast     Has been refered to derm.  . Adenomatous colon polyp 6/09    Resected on colonoscopy, no high grade dysplasia.   . Hypertensive retinopathy     Followed by Dr. Herbert Deaner.  . Diabetic peripheral neuropathy   . Postmenopausal bleeding 9/05    Endometrial biopsy showed  FOCAL TUBAL METAPLASIA   . Colon polyps     03/22/2008: adenomatous polyps x3 w no dysplasia. Needs repeat colonoscopy in 3 years   Current  Outpatient Prescriptions  Medication Sig Dispense Refill  . albuterol (PROVENTIL HFA;VENTOLIN HFA) 108 (90 BASE) MCG/ACT inhaler Inhale 2 puffs into the lungs every 2 (two) hours as needed for wheezing or shortness of breath (cough).  1 Inhaler  0  . albuterol (PROVENTIL HFA;VENTOLIN HFA) 108 (90 BASE) MCG/ACT inhaler Inhale 1-2 puffs into the lungs every 6 (six) hours as needed for wheezing or shortness of breath.  1 Inhaler  0  . aspirin 81 MG EC tablet Take 81 mg by mouth daily.        Marland Kitchen atenolol-chlorthalidone (TENORETIC) 50-25 MG per tablet Take 1 tablet by mouth daily.  90 tablet  4  . atorvastatin (LIPITOR) 40 MG tablet Take 1 tablet (40 mg total) by mouth daily.  30 tablet  11  . glimepiride (AMARYL) 2 MG tablet Take 1 tablet (2 mg total) by mouth every morning.  30 tablet  5  . guaiFENesin-codeine (ROBITUSSIN AC) 100-10 MG/5ML syrup Take 5 mLs by mouth 3 (three) times daily as needed for cough.  120 mL  0  . HYDROcodone-homatropine (HYCODAN) 5-1.5 MG/5ML syrup Take 5 mLs by mouth every 6 (six) hours as needed for cough.  30 mL  0  . hydrOXYzine (ATARAX/VISTARIL) 10 MG  tablet Take 10 mg by mouth 3 (three) times daily as needed for itching.      . losartan (COZAAR) 100 MG tablet Take 1 tablet (100 mg total) by mouth daily.  30 tablet  5  . metFORMIN (GLUMETZA) 1000 MG (MOD) 24 hr tablet Take 2 tablets (2,000 mg total) by mouth daily with breakfast.  60 tablet  5  . [DISCONTINUED] pantoprazole (PROTONIX) 20 MG tablet Take 1 tablet (20 mg total) by mouth daily.  30 tablet  0   No current facility-administered medications for this visit.    Review of Systems: General: Positive for mild fatigue. Denies fever, chills, diaphoresis, appetite change.  Respiratory: Positive for cough, mild wheezing. Denies SOB, DOE.   Cardiovascular: Denies chest pain and palpitations.  Gastrointestinal: Denies nausea, vomiting, abdominal pain, and diarrhea.  Genitourinary: Denies dysuria, increased frequency,  and flank pain. Endocrine: Positive for hot flashes. Denies polyuria, and polydipsia. Musculoskeletal: Denies myalgias, back pain, joint swelling, arthralgias and gait problem.  Skin: Denies pallor, rash and wounds.  Neurological: Denies dizziness, seizures, syncope, weakness, lightheadedness, numbness and headaches.  Psychiatric/Behavioral: Denies mood changes, and sleep disturbances.  Objective:   Physical Exam: Filed Vitals:   07/27/14 1034  BP: 183/85  Pulse: 74  Temp: 97.8 F (36.6 C)  TempSrc: Oral  Height: 5\' 6"  (1.676 m)  Weight: 177 lb 4.8 oz (80.423 kg)  SpO2: 100%    General: Elderly AA female, alert, cooperative, NAD. Mild cough on exam.  HEENT: PERRL, EOMI. Moist mucus membranes. Mild conjunctival injection. No mastoid or temporal tenderness on exam. Ears w/out erythema or effusions. No pharyngeal erythema, exudate, or tonsillar swelling.  Neck: Full range of motion without pain, supple, no lymphadenopathy or carotid bruits Lungs: Clear to ascultation bilaterally, normal work of respiration, no wheezes, rales, rhonchi Heart: RRR, no murmurs, gallops, or rubs Abdomen: Soft, non-tender, non-distended, BS + Extremities: No cyanosis, clubbing, or edema Neurologic: Alert & oriented X3, cranial nerves II-XII intact, strength grossly intact, sensation intact to light touch   Assessment & Plan:   Please see problem based assessment and plan.

## 2014-07-27 NOTE — Assessment & Plan Note (Signed)
BP Readings from Last 3 Encounters:  07/27/14 183/85  07/24/14 172/89  07/20/14 118/74    Lab Results  Component Value Date   NA 139 03/05/2014   K 3.9 03/05/2014   CREATININE 0.76 03/05/2014    Assessment: Blood pressure control: mildly elevated Progress toward BP goal:  deteriorated Comments: BP elevated today. Patient claims she did not take her medication this AM.   Plan: Medications:  continue current medications; Losartan 100 mg qd, atenolol-chlorthalidone 50-25 mg qd,  Other plans: RTC in 3 months.

## 2014-07-27 NOTE — Progress Notes (Signed)
Case discussed with Dr. Ronnald Ramp at time of visit. We reviewed the resident's history and exam and pertinent patient test results. I agree with the assessment, diagnosis, and plan of care documented in the resident's note.  She will need a repeat colonoscopy in late 2016 to follow-up on an adenomatous polyp that was removed endoscopically in 2013.

## 2014-08-03 ENCOUNTER — Ambulatory Visit: Payer: Medicaid Other

## 2014-08-03 ENCOUNTER — Inpatient Hospital Stay: Admission: RE | Admit: 2014-08-03 | Payer: Medicaid Other | Source: Ambulatory Visit

## 2014-08-26 ENCOUNTER — Other Ambulatory Visit: Payer: Self-pay | Admitting: Internal Medicine

## 2014-08-26 DIAGNOSIS — Z1231 Encounter for screening mammogram for malignant neoplasm of breast: Secondary | ICD-10-CM

## 2014-08-30 ENCOUNTER — Ambulatory Visit: Payer: Medicaid Other

## 2014-09-07 ENCOUNTER — Ambulatory Visit
Admission: RE | Admit: 2014-09-07 | Discharge: 2014-09-07 | Disposition: A | Discharge status: Home / Self Care | Payer: Medicaid Other | Source: Ambulatory Visit | Attending: Internal Medicine | Admitting: Internal Medicine

## 2014-09-07 DIAGNOSIS — Z1231 Encounter for screening mammogram for malignant neoplasm of breast: Secondary | ICD-10-CM

## 2014-09-15 ENCOUNTER — Telehealth: Payer: Self-pay | Admitting: Dietician

## 2014-09-15 NOTE — Telephone Encounter (Signed)
Coming in Friday at 10:30 am for an eye exam

## 2014-09-17 ENCOUNTER — Encounter: Payer: Self-pay | Admitting: Dietician

## 2014-09-17 ENCOUNTER — Other Ambulatory Visit: Payer: Self-pay | Admitting: Dietician

## 2014-09-17 ENCOUNTER — Ambulatory Visit: Payer: Medicaid Other | Admitting: Dietician

## 2014-09-17 DIAGNOSIS — E1165 Type 2 diabetes mellitus with hyperglycemia: Secondary | ICD-10-CM

## 2014-09-17 LAB — HM DIABETES EYE EXAM

## 2014-09-17 NOTE — Progress Notes (Signed)
Retinal images were done and transmited today.

## 2014-09-20 ENCOUNTER — Encounter: Payer: Self-pay | Admitting: *Deleted

## 2014-09-24 ENCOUNTER — Other Ambulatory Visit: Payer: Self-pay | Admitting: Internal Medicine

## 2014-09-28 ENCOUNTER — Telehealth: Payer: Self-pay | Admitting: *Deleted

## 2014-09-28 NOTE — Telephone Encounter (Signed)
HARINI DEARMOND  APPROVED 09/28/2014 - 09/28/2015  Pharmacy and pt aware.[name]12/22/201511:12 AM     Of note pt now uses [facility] in [place]  Ph# [phone]

## 2014-09-28 NOTE — Telephone Encounter (Signed)
Received PA request from pt's pharmacy for her losartan 100mg  tabs.  Medication is preferred after trial and failure of an ACE. Per pt's record she tried lisinopril in 2012.  Request submitted online via [employer].  Request sent for review.[name]12/22/201510:53 AM    Pt ID# [id number] [phone] Confirmation #:[id number] W

## 2014-10-04 ENCOUNTER — Telehealth: Payer: Self-pay | Admitting: Dietician

## 2014-10-04 ENCOUNTER — Other Ambulatory Visit: Payer: Self-pay | Admitting: Internal Medicine

## 2014-10-04 NOTE — Telephone Encounter (Signed)
Having trouble with meter to check blood sugars. Tried to assist her over phone. She is getting low battery message. Advised her to call toll free humber to see if they would mail her some or buy them at her pharmacy

## 2014-10-05 ENCOUNTER — Encounter: Payer: Self-pay | Admitting: Internal Medicine

## 2014-10-05 ENCOUNTER — Ambulatory Visit (INDEPENDENT_AMBULATORY_CARE_PROVIDER_SITE_OTHER): Payer: Medicaid Other | Admitting: Internal Medicine

## 2014-10-05 VITALS — BP 139/76 | HR 69 | Temp 97.5°F | Ht 64.0 in | Wt 176.7 lb

## 2014-10-05 DIAGNOSIS — M25552 Pain in left hip: Secondary | ICD-10-CM

## 2014-10-05 DIAGNOSIS — J302 Other seasonal allergic rhinitis: Secondary | ICD-10-CM

## 2014-10-05 DIAGNOSIS — E119 Type 2 diabetes mellitus without complications: Secondary | ICD-10-CM

## 2014-10-05 DIAGNOSIS — M25551 Pain in right hip: Secondary | ICD-10-CM

## 2014-10-05 DIAGNOSIS — E1165 Type 2 diabetes mellitus with hyperglycemia: Secondary | ICD-10-CM

## 2014-10-05 DIAGNOSIS — J069 Acute upper respiratory infection, unspecified: Secondary | ICD-10-CM

## 2014-10-05 LAB — GLUCOSE, CAPILLARY: Glucose-Capillary: 263 mg/dL — ABNORMAL HIGH (ref 70–99)

## 2014-10-05 MED ORDER — CETIRIZINE HCL 10 MG PO TABS: 10.0000 mg | ORAL_TABLET | Freq: Every day | ORAL | Status: DC

## 2014-10-05 MED ORDER — ACETAMINOPHEN 500 MG PO TABS: 500.0000 mg | ORAL_TABLET | Freq: Four times a day (QID) | ORAL | Status: DC | PRN

## 2014-10-05 MED ORDER — BENZONATATE 100 MG PO CAPS: 100.0000 mg | ORAL_CAPSULE | Freq: Three times a day (TID) | ORAL | Status: DC | PRN

## 2014-10-05 MED ORDER — SALINE SPRAY 0.65 % NA SOLN: 1.0000 | Freq: Four times a day (QID) | NASAL | Status: DC

## 2014-10-05 NOTE — Patient Instructions (Addendum)
General Instructions: -Start taking tessalon perles for your cough. You may also use Ricola sugar free for the cough.  -You may do salt gargle for the sore throat. Avoid hot foods.  -Use saline nasal spray for your nasal congestion.  -Start taking Zyrtec 10mg  daily for allergies and for the runny nose.  -You may take Tylenol 500mg , one tablet every 6 hours for the hip pain.  -if you have fever/chills, vomiting and cannot eat or drink, please go to the Emergency Department.  -Follow up with Dr. Ronnald Ramp in 1-2 months for diabetes care.   Happy New Year!    Please bring your medicines with you each time you come to clinic.  Medicines may include prescription medications, over-the-counter medications, herbal remedies, eye drops, vitamins, or other pills.   Progress Toward Treatment Goals:  Treatment Goal 07/27/2014  Hemoglobin A1C at goal  Blood pressure deteriorated  Stop smoking smoking the same amount    Self Care Goals & Plans:  Self Care Goal 10/05/2014  Manage my medications take my medicines as prescribed; bring my medications to every visit; refill my medications on time  Monitor my health -  Eat healthy foods drink diet soda or water instead of juice or soda; eat more vegetables; eat foods that are low in salt; eat baked foods instead of fried foods  Be physically active -  Stop smoking -  Meeting treatment goals -    Home Blood Glucose Monitoring 07/27/2014  Check my blood sugar once a day  When to check my blood sugar before breakfast     Care Management & Community Referrals:  Referral 07/27/2014  Referrals made for care management support none needed  Referrals made to community resources none

## 2014-10-06 DIAGNOSIS — J302 Other seasonal allergic rhinitis: Secondary | ICD-10-CM

## 2014-10-06 DIAGNOSIS — M25551 Pain in right hip: Secondary | ICD-10-CM | POA: Insufficient documentation

## 2014-10-06 DIAGNOSIS — M25552 Pain in left hip: Secondary | ICD-10-CM | POA: Insufficient documentation

## 2014-10-06 HISTORY — DX: Other seasonal allergic rhinitis: J30.2

## 2014-10-06 NOTE — Assessment & Plan Note (Signed)
Rx Zyrtec 10mg  daily

## 2014-10-06 NOTE — Assessment & Plan Note (Signed)
Her symptoms are most likely consistent with allergic rhinitis v viral etiology, flu unlikely given no fever/body aches. She has taken Robitussin with no improvement of her symptoms.  Rx tessalon Perles for cough--she will purchase this from the [facility] outpatient pharmacy since [facility] does not pay for this medicine Zyrtec for allergies Saline nasal spray 4 times daily for nasal congestion Ricola sugar free for sore throat Return to clinic as needed if symptoms worsen

## 2014-10-06 NOTE — Assessment & Plan Note (Signed)
Normal ROM on physical exam with mild "soreness" when standing from sitting, likely stress related. She states that she has had problems with arthritis in her hips that goes away with Tylenol Rx.   Rx Tylenol 500mg  every 6 hr PRN for the pain.  If her pain does not improve may need further eval, perhaps referral to PT or Sports med.

## 2014-10-06 NOTE — Assessment & Plan Note (Signed)
Lab Results  Component Value Date   HGBA1C 6.8 07/20/2014   HGBA1C 7.0 05/28/2014   HGBA1C 10.6 01/11/2014     Assessment: Diabetes control:  controlled Progress toward A1C goal:   at goal Comments: CBG elevated but in context of acute illness.   Plan: Medications:  continue current medications Home glucose monitoring: Frequency:   Timing:   Instruction/counseling given: discussed diet Educational resources provided: brochure (has information) Self management tools provided:   Other plans: Follow up with PCP in 1-2 months

## 2014-10-06 NOTE — Progress Notes (Signed)
   Subjective:    Patient ID: Bridget Owens, female    DOB: 06-May-1956, 58 y.o.   MRN: [id number]  HPI Bridget Owens is a 58 yr old woman with PMH of DM2, HTN, presenting for evaluation of cough scantly productive of white sputum for 1 week with sore throat and nasal congestion. She also reports having seasonal allergies but has not been taking it anything for this. Denies fever, chills, N/V, or sick contacts.  She also complains of bilateral hip pain that started around Christmas, she has had pain in her hips before from arthritis and it usually responds well to Tylenol.    Review of Systems  Constitutional: Negative for fever, chills, appetite change and fatigue.  HENT: Positive for congestion, postnasal drip, rhinorrhea and sore throat. Negative for ear pain, hearing loss, nosebleeds and sinus pressure.   Respiratory: Positive for cough. Negative for shortness of breath.   Cardiovascular: Negative for chest pain, palpitations and leg swelling.  Gastrointestinal: Negative for diarrhea.  Genitourinary: Negative for dysuria.  Skin: Negative for rash.  Neurological: Negative for dizziness, light-headedness and headaches.  Psychiatric/Behavioral: Negative for agitation.       Objective:   Physical Exam  Constitutional: She is oriented to person, place, and time. She appears well-developed and well-nourished. No distress.  HENT:  Mouth/Throat: Oropharynx is clear and moist.  Bilateral boggy nasal turbinates with clear discharge  Eyes: Conjunctivae are normal.  Cardiovascular: Normal rate.   Pulmonary/Chest: Effort normal. No respiratory distress. She has no wheezes. She has no rales.  Abdominal: Soft.  Musculoskeletal: She exhibits no edema or tenderness.  Lymphadenopathy:    She has no cervical adenopathy.  Neurological: She is alert and oriented to person, place, and time. Coordination normal.  Bilateral hip flexion/extension and strength normal but pain with standing from sitting    Skin: Skin is warm and dry. No rash noted. She is not diaphoretic.  Psychiatric: She has a normal mood and affect.  Nursing note and vitals reviewed.         Assessment & Plan:

## 2014-10-07 NOTE — Progress Notes (Signed)
Medicine attending: Medical history, presenting problems, physical findings, and medications, reviewed with Dr Kennerly on the day of the patient encounter and I concur with her evaluation and management plan. 

## 2014-10-11 NOTE — Telephone Encounter (Signed)
Call from pt checking on refill status.Bridget Hidden Cassady1/4/201612:27 PM

## 2014-10-12 ENCOUNTER — Other Ambulatory Visit: Payer: Self-pay | Admitting: Internal Medicine

## 2014-10-12 ENCOUNTER — Telehealth: Payer: Self-pay | Admitting: *Deleted

## 2014-10-12 DIAGNOSIS — E119 Type 2 diabetes mellitus without complications: Secondary | ICD-10-CM

## 2014-10-12 MED ORDER — METFORMIN HCL ER (MOD) 1000 MG PO TB24: ORAL_TABLET | ORAL | Status: DC

## 2014-10-12 NOTE — Telephone Encounter (Signed)
[facility] needs a refill on Metformin 1000mg  #60 - take one tablet by mouth twice daily with a meal. Last refill 02/05/14. Hilda Blades Janal Haak RN 10/12/14 10:20AM

## 2014-10-19 ENCOUNTER — Ambulatory Visit: Payer: Medicaid Other | Admitting: Internal Medicine

## 2014-10-19 ENCOUNTER — Ambulatory Visit (INDEPENDENT_AMBULATORY_CARE_PROVIDER_SITE_OTHER): Payer: Medicaid Other | Admitting: Internal Medicine

## 2014-10-19 VITALS — Temp 98.2°F | Wt 175.3 lb

## 2014-10-19 DIAGNOSIS — R55 Syncope and collapse: Secondary | ICD-10-CM

## 2014-10-19 DIAGNOSIS — I10 Essential (primary) hypertension: Secondary | ICD-10-CM

## 2014-10-19 DIAGNOSIS — J302 Other seasonal allergic rhinitis: Secondary | ICD-10-CM

## 2014-10-19 DIAGNOSIS — J069 Acute upper respiratory infection, unspecified: Secondary | ICD-10-CM

## 2014-10-19 LAB — BASIC METABOLIC PANEL WITHOUT GFR
BUN: 14 mg/dL (ref 6–23)
CO2: 29 meq/L (ref 19–32)
Calcium: 9.7 mg/dL (ref 8.4–10.5)
Chloride: 100 meq/L (ref 96–112)
Creat: 0.62 mg/dL (ref 0.50–1.10)
GFR, Est African American: >89 mL/min
GFR, Est Non African American: >89 mL/min
Glucose, Bld: 142 mg/dL — ABNORMAL HIGH (ref 70–99)
Potassium: 3.7 meq/L (ref 3.5–5.3)
Sodium: 139 meq/L (ref 135–145)

## 2014-10-19 MED ORDER — PHENYLEPHRINE HCL 0.5 % NA SOLN: 1.0000 [drp] | Freq: Two times a day (BID) | NASAL | Status: AC

## 2014-10-19 MED ORDER — IPRATROPIUM BROMIDE 0.06 % NA SOLN: 2.0000 | Freq: Three times a day (TID) | NASAL | Status: DC

## 2014-10-19 NOTE — Assessment & Plan Note (Addendum)
Appears to be resolving, though still with productive cough.  Does not think Tessalon or Mucinex helping is helping.  Likely related to postnasal drip from rhinorrhea. Reluctant to give systemic anti-cholinergic due to pre-syncope. -Ipratropium nasal spray. -Pseudoephedrine nasal spray for 3 days.

## 2014-10-19 NOTE — Assessment & Plan Note (Signed)
Eye discharge most consistent with seasonal allergies. Now resolving.  No evidence of ptosis on exam. -Continue Zyrtec 10 mg daily.

## 2014-10-19 NOTE — Progress Notes (Signed)
INTERNAL MEDICINE TEACHING ATTENDING ADDENDUM - Latham Kinzler, MD: I reviewed and discussed at the time of visit with the resident Dr. Moding, the patient's medical history, physical examination, diagnosis and results of pertinent tests and treatment and I agree with the patient's care as documented.  

## 2014-10-19 NOTE — Patient Instructions (Addendum)
Thank you for coming to clinic today [name].  General instructions: -I think your dizziness last week was from being dehydrated.  This may have been related to the cold you are still getting over. -Make sure you stay well hydrated. -If you continue to feel dizzy, come back to clinic. -I wrote you a prescription for two nasal sprays that will help decrease your congestion and cough. -Please make a follow up appointment to return to clinic in 3 months.  Please bring your medicines with you each time you come.   Medicines may be  Eye drops  Herbal   Vitamins  Pills  Seeing these help [place] take care of you.

## 2014-10-19 NOTE — Assessment & Plan Note (Addendum)
Occurred a week ago twice in one day, now resolved.  Orthostatic in clinic today, suggesting she may be dehydrated.  This is likely related to her cold that she is still getting over.  Chlorthalidone also likely contributing.  Asymptomatic today. -Check BMP today for kidney function. -Encouraged patient to drink plenty of fluids and stay hydrated. -Consider changing BP meds if symptoms return.

## 2014-10-19 NOTE — Assessment & Plan Note (Signed)
BP Readings from Last 3 Encounters:  10/05/14 139/76  07/27/14 183/85  07/24/14 172/89    Lab Results  Component Value Date   NA 139 03/05/2014   K 3.9 03/05/2014   CREATININE 0.76 03/05/2014    Assessment: Blood pressure control: moderately elevated Progress toward BP goal:  deteriorated Comments: Did not take blood pressure medications this morning.  Plan: Medications:  continue current medications: Tenoretic 50-25 mg daily, losartan 100 mg daily. Other plans: Recheck BP at next visit.

## 2014-10-19 NOTE — Progress Notes (Signed)
   Subjective:    Patient ID: Bridget Owens, female    DOB: [birth year], 59 y.o.   MRN: [id number]  HPI Bridget Owens is a 59 year old woman with history of DM2, arthritis, and HTN presenting with dizziness.  She reports dizziness 5-6 days ago.  She denies a feeling of the room spinning.  She felt like she was going to pass out, but she denies syncope.  She sat down, and the sensation went away.  She denies chest pain, shortness of breath, or palpitations.  She reports having this sensation twice last Thursday, but it has been better since then.  She also reports her eye got red and had a white discharge, and she is concerned that she may be getting Bell's Palsy again.  She also feels like her eye is "jumping."  She had a cold two weeks ago, and she came in for a cough.  She reports continued cough productive of mucus.  She denies fevers or chills, but she is having hot flashes which are chronic for her.  She says that she didn't take her blood pressure medications this morning because she was in a hurry.   Review of Systems  HENT: Positive for congestion and rhinorrhea. Negative for sore throat.   Eyes: Positive for discharge. Negative for visual disturbance.  Respiratory: Positive for cough. Negative for shortness of breath.   Cardiovascular: Negative for chest pain.  Gastrointestinal: Positive for nausea (Associated with hot flashes.). Negative for vomiting, abdominal pain, diarrhea and constipation.  Genitourinary: Negative for dysuria and difficulty urinating.  Musculoskeletal: Positive for myalgias (From coughing.). Negative for arthralgias.  Skin: Negative for rash.  Neurological: Positive for dizziness and light-headedness. Negative for weakness and numbness.       Objective:   Physical Exam  Constitutional: She is oriented to person, place, and time. She appears well-developed and well-nourished. No distress.  HENT:  Head: Normocephalic and atraumatic.  Mouth/Throat: Oropharyngeal  exudate present.  Eyes: Conjunctivae and EOM are normal. Pupils are equal, round, and reactive to light. No scleral icterus.  Neck: Normal range of motion. Neck supple.  Cardiovascular: Normal rate, regular rhythm and normal heart sounds.   Pulmonary/Chest: Effort normal and breath sounds normal. No respiratory distress.  Abdominal: Soft. Bowel sounds are normal. She exhibits no distension. There is no tenderness.  Musculoskeletal: Normal range of motion. She exhibits no edema or tenderness.  Neurological: She is alert and oriented to person, place, and time. No cranial nerve deficit. She exhibits normal muscle tone.  5/5 strength and sensation to light touch intact.  Skin: Skin is warm and dry. No rash noted. No erythema.        Assessment & Plan:  Please see problem-based assessment and plan.

## 2014-10-26 ENCOUNTER — Telehealth: Payer: Self-pay | Admitting: *Deleted

## 2014-10-26 NOTE — Telephone Encounter (Signed)
Pt was seen in clinic on 1/12 for acute URI and allergies.  Now her nose continues to run, and she has productive cough. A lot of mucous. (White) Denies fever.  Eating and drinking okay. She has taken Ipratropium nasal spray without relief.  Please call pt and advise her what else she can take. Pt # [phone]

## 2014-10-27 NOTE — Telephone Encounter (Signed)
Attempted to call patient, did not answer. Will attempt to call her back later today.

## 2014-10-28 ENCOUNTER — Encounter: Payer: Self-pay | Admitting: Internal Medicine

## 2014-10-28 ENCOUNTER — Ambulatory Visit: Payer: Medicaid Other | Admitting: Internal Medicine

## 2014-11-16 ENCOUNTER — Encounter: Payer: Self-pay | Admitting: Internal Medicine

## 2014-11-16 ENCOUNTER — Ambulatory Visit (INDEPENDENT_AMBULATORY_CARE_PROVIDER_SITE_OTHER): Payer: Medicaid Other | Admitting: Internal Medicine

## 2014-11-16 VITALS — BP 165/79 | HR 64 | Temp 97.6°F | Ht 65.0 in | Wt 179.8 lb

## 2014-11-16 DIAGNOSIS — K219 Gastro-esophageal reflux disease without esophagitis: Secondary | ICD-10-CM

## 2014-11-16 DIAGNOSIS — E118 Type 2 diabetes mellitus with unspecified complications: Secondary | ICD-10-CM

## 2014-11-16 DIAGNOSIS — I10 Essential (primary) hypertension: Secondary | ICD-10-CM

## 2014-11-16 LAB — POCT GLYCOSYLATED HEMOGLOBIN (HGB A1C): Hemoglobin A1C: 7

## 2014-11-16 LAB — GLUCOSE, CAPILLARY: Glucose-Capillary: 92 mg/dL (ref 70–99)

## 2014-11-16 MED ORDER — PANTOPRAZOLE SODIUM 40 MG PO TBEC: 40.0000 mg | DELAYED_RELEASE_TABLET | Freq: Every day | ORAL | Status: DC

## 2014-11-16 NOTE — Progress Notes (Signed)
Subjective:   Patient ID: Bridget Owens female   DOB: [birth year] 59 y.o.   MRN: [id number]  HPI: Bridget Owens is a 59 y.o. female w/ PMHx of HTN, DM type II, HLD, GERD, and hot flashes, presents to the clinic today for a follow-up visit. Doing well today, has some very mild complaints including some pruritis around her ankles and a continued mild cough and slight epigastric discomfort. Has had mild cough for quite some time, attributed this to continued URI, however, now mentions some mild reflux type symptoms as well. Intermittent regurgitation and nausea as well at times. Denies chest pain or SOB. BP somewhat elevated today, claims to have been compliant w/ medications, but then says she forgot a dose yesterday.   Past Medical History  Diagnosis Date  . Bell's palsy 01/2009    Left sided  . Type II diabetes mellitus   . Hypertension   . Hyperlipidemia   . Allergic rhinitis   . Intolerance, drug     Leg cramps on Lipitor  . GERD (gastroesophageal reflux disease)   . Uterine fibroid   . Chest pain     Exercise stress test negative 6/07  . Postmenopausal symptoms     Hot flashes, vaginal dryness, iritability, difficulty sleeping. as of 2005.  Marland Kitchen Insomnia   . External hemorrhoid   . Lipoma 12/11    Posterior neck.   . Osteoarthritis     Carpometacarpal joint of right thumb  . Trigger finger     Right thumb  . Sebaceous cyst of breast     Has been refered to derm.  . Adenomatous colon polyp 6/09    Resected on colonoscopy, no high grade dysplasia.   . Hypertensive retinopathy     Followed by Dr. Herbert Deaner.  . Diabetic peripheral neuropathy   . Postmenopausal bleeding 9/05    Endometrial biopsy showed  FOCAL TUBAL METAPLASIA   . Colon polyps     03/22/2008: adenomatous polyps x3 w no dysplasia. Needs repeat colonoscopy in 3 years   Current Outpatient Prescriptions  Medication Sig Dispense Refill  . acetaminophen (TYLENOL EX ST ARTHRITIS PAIN) 500 MG tablet Take 1 tablet (500  mg total) by mouth every 6 (six) hours as needed. 30 tablet 0  . albuterol (PROVENTIL HFA;VENTOLIN HFA) 108 (90 BASE) MCG/ACT inhaler Inhale 1-2 puffs into the lungs every 6 (six) hours as needed for wheezing or shortness of breath. 1 Inhaler 0  . aspirin 81 MG EC tablet Take 81 mg by mouth daily.      Marland Kitchen atenolol-chlorthalidone (TENORETIC) 50-25 MG per tablet Take 1 tablet by mouth daily. 90 tablet 4  . atorvastatin (LIPITOR) 40 MG tablet Take 1 tablet (40 mg total) by mouth daily. 30 tablet 11  . benzonatate (TESSALON) 100 MG capsule Take 1 capsule (100 mg total) by mouth 3 (three) times daily as needed for cough. 20 capsule 0  . cetirizine (ZYRTEC) 10 MG tablet Take 1 tablet (10 mg total) by mouth daily. 100 tablet 0  . glimepiride (AMARYL) 2 MG tablet Take 1 tablet (2 mg total) by mouth every morning. 30 tablet 5  . ipratropium (ATROVENT) 0.06 % nasal spray Place 2 sprays into the nose 3 (three) times daily. 15 mL 0  . losartan (COZAAR) 100 MG tablet TAKE 1 TABLET BY MOUTH DAILY 30 tablet 5  . metFORMIN (GLUMETZA) 1000 MG (MOD) 24 hr tablet TAKE 2 TABLETS BY MOUTH EVERY DAY WITH BREAKFAST 60 tablet 5  .  sodium chloride (OCEAN) 0.65 % SOLN nasal spray Place 1 spray into both nostrils 4 (four) times daily. 30 mL 0  . [DISCONTINUED] pantoprazole (PROTONIX) 20 MG tablet Take 1 tablet (20 mg total) by mouth daily. 30 tablet 0   No current facility-administered medications for this visit.    Review of Systems  General: Denies fever, diaphoresis, appetite change, and fatigue.  Respiratory: Positive for cough. Denies SOB and wheezing.   Cardiovascular: Denies chest pain and palpitations.  Gastrointestinal: Positive for mild nausea and epigastric pain. Denies vomiting and diarrhea Musculoskeletal: Denies myalgias, arthralgias, back pain, and gait problem.  Neurological: Denies dizziness, syncope, weakness, lightheadedness, and headaches.  Psychiatric/Behavioral: Denies mood changes, sleep  disturbance, and agitation.   Objective:   Physical Exam: Filed Vitals:   11/16/14 1533  BP: 165/79  Pulse: 64  Temp: 97.6 F (36.4 C)  TempSrc: Oral  Height: 5\' 5"  (1.651 m)  Weight: 179 lb 12.8 oz (81.557 kg)  SpO2: 100%    General: Elderly AA female, alert, cooperative, NAD. Mild cough on exam.  HEENT: PERRL, EOMI. Moist mucus membranes. No pharyngeal erythema, exudate, or tonsillar swelling.  Neck: Full range of motion without pain, supple, no lymphadenopathy or carotid bruits Lungs: Clear to ascultation bilaterally, normal work of respiration, no wheezes, rales, rhonchi Heart: RRR, no murmurs, gallops, or rubs Abdomen: Soft, non-tender, non-distended, BS + Extremities: No cyanosis, clubbing, or edema Neurologic: Alert & oriented X3, cranial nerves II-XII intact, strength grossly intact, sensation intact to light touch   Assessment & Plan:   Please see problem based assessment and plan.

## 2014-11-16 NOTE — Patient Instructions (Addendum)
  General Instructions:  1. Please schedule follow up for 3 months.  2. Please take all medications as previously prescribed with the following changes:  Start taking Protonix 40 mg daily for reflux symptoms. TAKE EVERY DAY.  3. If you have worsening of your symptoms or new symptoms arise, please call the clinic ([phone]), or go to the ER immediately if symptoms are severe.  You have done a great job in taking all your medications. Please continue to do this.   Please bring your medicines with you each time you come to clinic.  Medicines may include prescription medications, over-the-counter medications, herbal remedies, eye drops, vitamins, or other pills.   Progress Toward Treatment Goals:  Treatment Goal 11/16/2014  Hemoglobin A1C unchanged  Blood pressure deteriorated  Stop smoking smoking the same amount    Self Care Goals & Plans:  Self Care Goal 11/16/2014  Manage my medications take my medicines as prescribed; bring my medications to every visit; refill my medications on time  Monitor my health -  Eat healthy foods drink diet soda or water instead of juice or soda; eat more vegetables; eat foods that are low in salt; eat baked foods instead of fried foods; eat fruit for snacks and desserts  Be physically active find an activity I enjoy; take a walk every day  Stop smoking go to the [employer] ([email or web address]  Meeting treatment goals maintain the current self-care plan    Home Blood Glucose Monitoring 11/16/2014  Check my blood sugar once a day  When to check my blood sugar before breakfast     Care Management & Community Referrals:  Referral 11/16/2014  Referrals made for care management support none needed  Referrals made to community resources none

## 2014-11-17 NOTE — Assessment & Plan Note (Signed)
BP Readings from Last 3 Encounters:  11/16/14 165/79  10/05/14 139/76  07/27/14 183/85    Lab Results  Component Value Date   NA 139 10/19/2014   K 3.7 10/19/2014   CREATININE 0.62 10/19/2014    Assessment: Blood pressure control: moderately elevated Progress toward BP goal:  deteriorated Comments: BP elevated today, normal at last visit. Says she has been taking her medications but also says she missed a dose yesterday. Given the labile nature of her BP, no changes to be made at this time.   Plan: Medications:  continue current medications Educational resources provided: brochure (denies) Self management tools provided: home blood pressure logbook Other plans: Instructed patient to check BP at home several times prior to returning to clinic at next visit.

## 2014-11-17 NOTE — Assessment & Plan Note (Signed)
Lab Results  Component Value Date   HGBA1C 7.0 11/16/2014   HGBA1C 6.8 07/20/2014   HGBA1C 7.0 05/28/2014     Assessment: Diabetes control: good control (HgbA1C at goal) Progress toward A1C goal:  unchanged Comments: HbA1c generally unchanged at this time, still on Metformin 1000 mg (long acting) daily + glimepiride 2 mg daily. Says she has had one low blood sugar several weeks ago into the 60's but says it came back up after she ate some food.   Plan: Medications:  continue current medications Home glucose monitoring: Frequency: once a day Timing: before breakfast Instruction/counseling given: discussed diet and need for weight loss.  Educational resources provided: brochure (denies) Self management tools provided:   Other plans: None

## 2014-11-17 NOTE — Assessment & Plan Note (Signed)
Describing some epigastric pain, nausea, and mild regurgitation in addition to her cough which she has had for quite some time. Feel that this may all be related to GERD, is not taking PPI at this time.  -Start Protonix 40 mg daily -May need CXR at next visit if no improvement in cough w/ PPI

## 2014-11-19 NOTE — Progress Notes (Signed)
INTERNAL MEDICINE TEACHING ATTENDING ADDENDUM - Deyton Ellenbecker, MD: I reviewed and discussed at the time of visit with the resident Dr. Gargan, the patient's medical history, physical examination, diagnosis and results of pertinent tests and treatment and I agree with the patient's care as documented.  

## 2014-12-04 ENCOUNTER — Other Ambulatory Visit: Payer: Self-pay | Admitting: Internal Medicine

## 2014-12-14 ENCOUNTER — Ambulatory Visit: Payer: Medicaid Other | Admitting: Internal Medicine

## 2014-12-21 ENCOUNTER — Ambulatory Visit (INDEPENDENT_AMBULATORY_CARE_PROVIDER_SITE_OTHER): Payer: Medicaid Other | Admitting: Internal Medicine

## 2014-12-21 ENCOUNTER — Encounter: Payer: Self-pay | Admitting: Internal Medicine

## 2014-12-21 VITALS — BP 134/73 | HR 76 | Temp 98.0°F | Ht 65.0 in | Wt 181.9 lb

## 2014-12-21 DIAGNOSIS — N3946 Mixed incontinence: Secondary | ICD-10-CM

## 2014-12-21 DIAGNOSIS — G629 Polyneuropathy, unspecified: Secondary | ICD-10-CM

## 2014-12-21 DIAGNOSIS — E119 Type 2 diabetes mellitus without complications: Secondary | ICD-10-CM

## 2014-12-21 DIAGNOSIS — G5793 Unspecified mononeuropathy of bilateral lower limbs: Secondary | ICD-10-CM

## 2014-12-21 DIAGNOSIS — G5790 Unspecified mononeuropathy of unspecified lower limb: Secondary | ICD-10-CM | POA: Insufficient documentation

## 2014-12-21 DIAGNOSIS — E118 Type 2 diabetes mellitus with unspecified complications: Secondary | ICD-10-CM

## 2014-12-21 DIAGNOSIS — I10 Essential (primary) hypertension: Secondary | ICD-10-CM

## 2014-12-21 LAB — POCT URINALYSIS DIPSTICK
Bilirubin, UA: NEGATIVE
Blood, UA: NEGATIVE
Glucose, UA: NEGATIVE
Ketones, UA: NEGATIVE
Nitrite, UA: NEGATIVE
Protein, UA: NEGATIVE
Spec Grav, UA: 1.01
Urobilinogen, UA: 0.2
pH, UA: 6.5

## 2014-12-21 LAB — GLUCOSE, CAPILLARY: Glucose-Capillary: 156 mg/dL — ABNORMAL HIGH (ref 70–99)

## 2014-12-21 NOTE — Progress Notes (Signed)
Subjective:   Patient ID: Bridget Owens female   DOB: 1956/06/11 59 y.o.   MRN: [id number]  HPI: [name] is a 59 y.o. female w/ PMHx of HTN, DM type II, HLD, GERD, and hot flashes, presents to the clinic today for an acute visit for mild bilateral ankle pain. The patient states that she has the pain the worst at night, described as a mild burning pain from the dorsum of her feet into the ankles. She has had this pain for quite some time after further discussion and says that when she takes Tylenol PM to help her sleep the pain improves.  [name] is also complaining of a mild increase in urinary frequency. She states that she goes to the bathroom frequently and only makes a small amount of urine. She denies dysuria, hematuria, foul smelling urine, abdominal pain, nausea, vomiting, fever or chills. She does admit to long standing symptoms of both stress and urge incontinence and after further discussion, it seems that she has had these issues for quite some time. Discussed possible treatments for this including Kegel exercises and anti-cholinergics, says she will try the exercises but declines medications.  Otherwise, she claims her CBG's have been well controlled, her cough has improved, and her reflux symptoms have not been bothering her lately. She still admits to some mild fatigue and hot flashes, but says these are both quite tolerable.   Past Medical History  Diagnosis Date  . Bell's palsy 01/2009    Left sided  . Type II diabetes mellitus   . Hypertension   . Hyperlipidemia   . Allergic rhinitis   . Intolerance, drug     Leg cramps on Lipitor  . GERD (gastroesophageal reflux disease)   . Uterine fibroid   . Chest pain     Exercise stress test negative 6/07  . Postmenopausal symptoms     Hot flashes, vaginal dryness, iritability, difficulty sleeping. as of 2005.  Marland Kitchen Insomnia   . External hemorrhoid   . Lipoma 12/11    Posterior neck.   . Osteoarthritis     Carpometacarpal  joint of right thumb  . Trigger finger     Right thumb  . Sebaceous cyst of breast     Has been refered to derm.  . Adenomatous colon polyp 6/09    Resected on colonoscopy, no high grade dysplasia.   . Hypertensive retinopathy     Followed by Dr. Herbert Deaner.  . Diabetic peripheral neuropathy   . Postmenopausal bleeding 9/05    Endometrial biopsy showed  FOCAL TUBAL METAPLASIA   . Colon polyps     03/22/2008: adenomatous polyps x3 w no dysplasia. Needs repeat colonoscopy in 3 years   Current Outpatient Prescriptions  Medication Sig Dispense Refill  . acetaminophen (TYLENOL EX ST ARTHRITIS PAIN) 500 MG tablet Take 1 tablet (500 mg total) by mouth every 6 (six) hours as needed. 30 tablet 0  . albuterol (PROVENTIL HFA;VENTOLIN HFA) 108 (90 BASE) MCG/ACT inhaler Inhale 1-2 puffs into the lungs every 6 (six) hours as needed for wheezing or shortness of breath. 1 Inhaler 0  . aspirin 81 MG EC tablet Take 81 mg by mouth daily.      Marland Kitchen atenolol-chlorthalidone (TENORETIC) 50-25 MG per tablet TAKE 1 TABLET BY MOUTH DAILY 90 tablet 1  . atorvastatin (LIPITOR) 40 MG tablet TAKE 1 TABLET BY MOUTH EVERY DAY 30 tablet 5  . benzonatate (TESSALON) 100 MG capsule Take 1 capsule (100 mg total)  by mouth 3 (three) times daily as needed for cough. 20 capsule 0  . cetirizine (ZYRTEC) 10 MG tablet Take 1 tablet (10 mg total) by mouth daily. 100 tablet 0  . glimepiride (AMARYL) 2 MG tablet Take 1 tablet (2 mg total) by mouth every morning. 30 tablet 5  . ipratropium (ATROVENT) 0.06 % nasal spray Place 2 sprays into the nose 3 (three) times daily. 15 mL 0  . losartan (COZAAR) 100 MG tablet TAKE 1 TABLET BY MOUTH DAILY 30 tablet 5  . metFORMIN (GLUMETZA) 1000 MG (MOD) 24 hr tablet TAKE 2 TABLETS BY MOUTH EVERY DAY WITH BREAKFAST 60 tablet 5  . pantoprazole (PROTONIX) 40 MG tablet Take 1 tablet (40 mg total) by mouth daily. 30 tablet 2  . sodium chloride (OCEAN) 0.65 % SOLN nasal spray Place 1 spray into both nostrils 4  (four) times daily. 30 mL 0   No current facility-administered medications for this visit.    Review of Systems  General: Denies fever, diaphoresis, appetite change, and fatigue.  Respiratory: Denies cough, SOB and wheezing.   Cardiovascular: Denies chest pain and palpitations.  Gastrointestinal: Denies nausea, vomiting, abdominal pain, and diarrhea GU: Positive for increased frequency, urgency, and stress symptoms. Denies dysuria, hematuria, or flank pain.  Musculoskeletal: Positive for bilateral ankle pain. Denies myalgias, back pain, and gait problem.  Neurological: Denies dizziness, syncope, weakness, lightheadedness, and headaches.  Psychiatric/Behavioral: Denies mood changes, sleep disturbance, and agitation.   Objective:   Physical Exam: Filed Vitals:   12/21/14 1426  BP: 134/73  Pulse: 76  Temp: 98 F (36.7 C)  TempSrc: Oral  Height: 5\' 5"  (1.651 m)  Weight: 181 lb 14.4 oz (82.509 kg)  SpO2: 99%    General: Elderly AA female, alert, cooperative, NAD. HEENT: PERRL, EOMI. Moist mucus membranes. Neck: Full range of motion without pain, supple, no lymphadenopathy. Lungs: Clear to ascultation bilaterally, normal work of respiration, no wheezes, rales, rhonchi. Heart: RRR, no murmurs, gallops, or rubs. Abdomen: Soft, non-tender, non-distended, BS +. Extremities: No cyanosis, clubbing, or edema. No ankle tenderness on exam.  Neurologic: Alert & oriented x3, cranial nerves II-XII intact, strength grossly intact, sensation intact to light touch   Assessment & Plan:   Please see problem based assessment and plan.

## 2014-12-21 NOTE — Patient Instructions (Signed)
General Instructions:  1. Schedule follow up appointment for 3 months.   2. Please take all medications as previously prescribed.  3. If you have worsening of your symptoms or new symptoms arise, please call the clinic ([phone]), or go to the ER immediately if symptoms are severe.  You have done a great job in taking all your medications. Please continue to do this.  Please bring your medicines with you each time you come to clinic.  Medicines may include prescription medications, over-the-counter medications, herbal remedies, eye drops, vitamins, or other pills.   Progress Toward Treatment Goals:  Treatment Goal 12/21/2014  Hemoglobin A1C at goal  Blood pressure at goal  Stop smoking smoking less    Self Care Goals & Plans:  Self Care Goal 12/21/2014  Manage my medications take my medicines as prescribed; bring my medications to every visit; refill my medications on time  Monitor my health -  Eat healthy foods drink diet soda or water instead of juice or soda; eat more vegetables; eat foods that are low in salt; eat baked foods instead of fried foods; eat fruit for snacks and desserts  Be physically active -  Stop smoking -  Meeting treatment goals maintain the current self-care plan    Home Blood Glucose Monitoring 12/21/2014  Check my blood sugar once a day  When to check my blood sugar before breakfast     Care Management & Community Referrals:  Referral 12/21/2014  Referrals made for care management support none needed  Referrals made to community resources none

## 2014-12-21 NOTE — Assessment & Plan Note (Signed)
BP Readings from Last 3 Encounters:  12/21/14 134/73  11/16/14 165/79  10/05/14 139/76    Lab Results  Component Value Date   NA 139 10/19/2014   K 3.7 10/19/2014   CREATININE 0.62 10/19/2014    Assessment: Blood pressure control: controlled Progress toward BP goal:  at goal Comments: Taking Losartan 100 mg daily, Atenolol-Chlorthalidone 50-25 mg daily. BP controlled today.   Plan: Medications:  continue current medications Educational resources provided: brochure (denies) Self management tools provided:   Other plans: RTC in 3 months

## 2014-12-21 NOTE — Assessment & Plan Note (Signed)
Lab Results  Component Value Date   HGBA1C 7.0 11/16/2014   HGBA1C 6.8 07/20/2014   HGBA1C 7.0 05/28/2014     Assessment: Diabetes control: good control (HgbA1C at goal) Progress toward A1C goal:  at goal Comments: Compliant w/ meds  Plan: Medications:  continue current medications Home glucose monitoring: Frequency: once a day Timing: before breakfast Instruction/counseling given: discussed the need for weight loss and discussed diet Educational resources provided: brochure (denies) Self management tools provided:   Other plans: RTC in 3 months for repeat HbA1c

## 2014-12-21 NOTE — Assessment & Plan Note (Signed)
Patient describing increased urinary frequency. After longer discussion, seems this has been going on for quite some time. Denies dysuria, hematuria, flank pain, nausea, vomiting, fever, or chills. Do not suspect UTI given symptomatology and time course of frequency. Patient also describing incontinence w/ laughing, coughing, and sneezing, as well as urinary urgency. Discussed medications to take for urge incontinence, declines this at this time given adverse effects of anti-cholinergic medications.  -Discussed Kegel exercises, said she will try this.

## 2014-12-21 NOTE — Assessment & Plan Note (Signed)
Patient's main complaint is "ankle pain" starting in the dorsum of her foot, extending to just above her ankles bilaterally. States the pain is the worst at night when she sleeps, not associated w/ the level of activity she had the day prior. Describes the pain as a "burning" sharp pain w/ some mild tingling sensation as well. Suspect this is autonomic neuropathy associated w/ previous h/o uncontrolled DM type II. Previous XR of the foot in the past do not give any impression of degenerative disease as well as her description of the pain sounds more like a neuropathic type pain. Says she takes Tylenol PM infrequently which helps. Discussed importance of CBG control and possibility of medications to help with this pain in the future. Says she is not interested at this time.  -If continues to complain of neuropathic pain, can consider low dose TCA, duloxetine, or neurontin, the latter two agents may also be beneficial for her hot flashes (chronic complaint) as well. There is mixed evidence for this.

## 2014-12-24 NOTE — Progress Notes (Signed)
Internal Medicine Clinic Attending  Case discussed with Dr. Lemler at the time of the visit.  We reviewed the resident's history and exam and pertinent patient test results.  I agree with the assessment, diagnosis, and plan of care documented in the resident's note.  

## 2014-12-30 ENCOUNTER — Other Ambulatory Visit: Payer: Self-pay | Admitting: Internal Medicine

## 2014-12-30 DIAGNOSIS — E118 Type 2 diabetes mellitus with unspecified complications: Secondary | ICD-10-CM

## 2015-01-17 ENCOUNTER — Telehealth: Payer: Self-pay | Admitting: *Deleted

## 2015-01-17 NOTE — Telephone Encounter (Signed)
Pt called - left ear hurting since last PM and right foot hurting past 2 days. No open appt at this time - pt aware clinic will call pt when clinic has  an open appt. Name added to list. Offered ER or Urgent Care - pt declined. Hilda Blades Theodis Kinsel RN 01/17/15 4:30PM

## 2015-01-27 ENCOUNTER — Ambulatory Visit: Payer: Medicaid Other | Admitting: Internal Medicine

## 2015-01-31 ENCOUNTER — Ambulatory Visit (INDEPENDENT_AMBULATORY_CARE_PROVIDER_SITE_OTHER): Payer: Medicaid Other | Admitting: Internal Medicine

## 2015-01-31 ENCOUNTER — Encounter: Payer: Self-pay | Admitting: Internal Medicine

## 2015-01-31 VITALS — BP 137/74 | HR 73 | Temp 97.8°F | Wt 178.8 lb

## 2015-01-31 DIAGNOSIS — L659 Nonscarring hair loss, unspecified: Secondary | ICD-10-CM

## 2015-01-31 DIAGNOSIS — E118 Type 2 diabetes mellitus with unspecified complications: Secondary | ICD-10-CM

## 2015-01-31 DIAGNOSIS — K219 Gastro-esophageal reflux disease without esophagitis: Secondary | ICD-10-CM

## 2015-01-31 DIAGNOSIS — E119 Type 2 diabetes mellitus without complications: Secondary | ICD-10-CM | POA: Diagnosis not present

## 2015-01-31 DIAGNOSIS — E785 Hyperlipidemia, unspecified: Secondary | ICD-10-CM

## 2015-01-31 DIAGNOSIS — J302 Other seasonal allergic rhinitis: Secondary | ICD-10-CM

## 2015-01-31 DIAGNOSIS — I10 Essential (primary) hypertension: Secondary | ICD-10-CM | POA: Diagnosis not present

## 2015-01-31 LAB — LIPID PANEL
Cholesterol: 170 mg/dL (ref 0–200)
HDL: 41 mg/dL — ABNORMAL LOW (ref >=46)
LDL Cholesterol: 102 mg/dL — ABNORMAL HIGH (ref 0–99)
Total CHOL/HDL Ratio: 4.1 Ratio
Triglycerides: 137 mg/dL (ref <150)
VLDL: 27 mg/dL (ref 0–40)

## 2015-01-31 LAB — POCT GLYCOSYLATED HEMOGLOBIN (HGB A1C): Hemoglobin A1C: 7.8

## 2015-01-31 LAB — GLUCOSE, CAPILLARY: Glucose-Capillary: 187 mg/dL — ABNORMAL HIGH (ref 70–99)

## 2015-01-31 LAB — TSH: TSH: 1.87 u[IU]/mL (ref 0.350–4.500)

## 2015-01-31 NOTE — Assessment & Plan Note (Signed)
Patient's symptoms of cough, headaches, sinus pressure, ear pain, and rhinorrhea are likely related to her allergies. She states she has not been taking her home Zyrtec. I instructed her to restart this to alleviate her symptoms.  - Home Zyrtec 10 mg daily restarted

## 2015-01-31 NOTE — Assessment & Plan Note (Signed)
Lab Results  Component Value Date   HGBA1C 7.8 01/31/2015   HGBA1C 7.0 11/16/2014   HGBA1C 6.8 07/20/2014     Assessment: Diabetes control: good control (HgbA1C at goal) Progress toward A1C goal:  at goal Comments: HbA1c has increased slightly. Blood sugars from her meter ranged in the 120s-200s with many at the higher end of the spectrum.   Plan: Medications:  continue current medications Instruction/counseling given: reminded to bring blood glucose meter & log to each visit, reminded to bring medications to each visit and discussed diet Educational resources provided: brochure Self management tools provided: copy of home glucose meter download Other plans:  - Continue Metformin 2000 mg daily  - Continue blood sugar testing 3 times daily  - Referral to podiatry placed

## 2015-01-31 NOTE — Patient Instructions (Signed)
It was a pleasure seeing you today, Bridget Owens.  Cough - Start taking Protonix again - Also restart your Zyrtec for allergies  Hair loss - Checking labs today  Diabetes - Continue Metformin 2000 mg daily   General Instructions:   Please bring your medicines with you each time you come to clinic.  Medicines may include prescription medications, over-the-counter medications, herbal remedies, eye drops, vitamins, or other pills.   Progress Toward Treatment Goals:  Treatment Goal 12/21/2014  Hemoglobin A1C at goal  Blood pressure at goal  Stop smoking smoking less    Self Care Goals & Plans:  Self Care Goal 01/31/2015  Manage my medications take my medicines as prescribed; bring my medications to every visit  Monitor my health keep track of my blood glucose; bring my glucose meter and log to each visit; check my feet daily  Eat healthy foods drink diet soda or water instead of juice or soda; eat foods that are low in salt; eat baked foods instead of fried foods  Be physically active -  Stop smoking go to the [employer] (https://[employer]/); cut down the number of cigarettes smoked  Meeting treatment goals -    Home Blood Glucose Monitoring 12/21/2014  Check my blood sugar once a day  When to check my blood sugar before breakfast     Care Management & Community Referrals:  Referral 12/21/2014  Referrals made for care management support none needed  Referrals made to community resources none

## 2015-01-31 NOTE — Assessment & Plan Note (Signed)
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 Will check testosterone to rule out hyperandrogenic state.  - Check testosterone and TSH

## 2015-01-31 NOTE — Progress Notes (Signed)
   Subjective:    Patient ID: Bridget Owens, female    DOB: 04/09/1956, 59 y.o.   MRN: [id number]  HPI Ms. Gilchrest is a 59yo woman with PMHx of HTN, Type 2 DM, hyperlipidemia, and GERD who presents today for an acute visit. She states she has had a cough for the past month that will not go away. She describes mostly a dry cough with occasional sputum production that is clear. She also notes a burning sensation in her chest sometimes. She reports associated headaches, right ear pain, sinus pressure, and rhinorrhea. She denies fevers, chills, sore throat, dyspnea, and wheezing. She states she has not been taking her home Zyrtec or Protonix. She is not on an ACE inhibitor (on ARB).    Patient also notes hair loss that started 1 year ago. She states she continues to lose more hair and is having to wear wigs or hats to hide her hair loss. She reports having dark hair growth on her chair sometimes, but denies acne. She denies pulling out her hair.    Review of Systems General: Denies night sweats, changes in weight, changes in appetite HEENT: Denies changes in vision  CV: Denies CP, palpitations, orthopnea Pulm: See above  GI: Denies abdominal pain, nausea, vomiting, diarrhea, constipation, melena, hematochezia GU: Denies dysuria, hematuria, frequency Msk: Denies muscle cramps, joint pains Neuro: Denies weakness, numbness, tingling Skin: Denies rashes, bruising    Objective:   Physical Exam General: sitting up in chair, NAD HEENT: Hair loss noted mostly at crown of head. No erythema or lesions noted. EOMI, PERRL. Ear canals and tympanic membranes appear normal bilaterally. Pharynx non-erythematous. Mucus membranes moist. Neck: supple, no lymphadenopathy CV: RRR, no m/g/r Pulm: CTA bilaterally, breaths non-labored Abd: BS+, soft, non-tender Ext: warm, no edema, moves all Neuro: alert and oriented x 3, no focal deficits    Assessment & Plan:  Please refer to A&P documentation.

## 2015-01-31 NOTE — Assessment & Plan Note (Signed)
BP Readings from Last 3 Encounters:  01/31/15 137/74  12/21/14 134/73  11/16/14 165/79    Lab Results  Component Value Date   NA 139 10/19/2014   K 3.7 10/19/2014   CREATININE 0.62 10/19/2014    Assessment: Blood pressure control: controlled Progress toward BP goal:  at goal Comments: BP well controlled.   Plan: Medications:  continue current medications. Continue atenolol-chlorthalidone 50-25 mg daily and losartan 100 mg daily. Educational resources provided: brochure Self management tools provided: home blood pressure logbook

## 2015-01-31 NOTE — Assessment & Plan Note (Signed)
-   Check lipid profile today - Continue Atorvastatin 40 mg daily

## 2015-01-31 NOTE — Assessment & Plan Note (Signed)
Patient has not been taking her home Protonix and has symptoms of cough and burning sensation in her chest occasionally likely due to GERD.  - Protonix 40 mg daily restarted

## 2015-02-01 LAB — TESTOSTERONE: Testosterone: 29 ng/dL (ref 10–70)

## 2015-02-01 NOTE — Progress Notes (Signed)
INTERNAL MEDICINE TEACHING ATTENDING ADDENDUM - Colbe Viviano, MD: I reviewed and discussed at the time of visit with the resident Dr. Rivet, the patient's medical history, physical examination, diagnosis and results of pertinent tests and treatment and I agree with the patient's care as documented.  

## 2015-02-02 ENCOUNTER — Other Ambulatory Visit: Payer: Self-pay | Admitting: *Deleted

## 2015-02-02 DIAGNOSIS — E119 Type 2 diabetes mellitus without complications: Secondary | ICD-10-CM

## 2015-02-02 DIAGNOSIS — E1142 Type 2 diabetes mellitus with diabetic polyneuropathy: Secondary | ICD-10-CM

## 2015-02-03 MED ORDER — METFORMIN HCL 1000 MG PO TABS: 1000.0000 mg | ORAL_TABLET | Freq: Two times a day (BID) | ORAL | Status: DC

## 2015-02-03 NOTE — Telephone Encounter (Signed)
Pt's insurance will not pay for this metformin (glumetza) she needs regular metformin, could you please change pt is out and has been for 2 days

## 2015-02-21 ENCOUNTER — Encounter: Payer: Self-pay | Admitting: Podiatry

## 2015-02-21 ENCOUNTER — Ambulatory Visit (INDEPENDENT_AMBULATORY_CARE_PROVIDER_SITE_OTHER): Payer: Medicaid Other | Admitting: Podiatry

## 2015-02-21 VITALS — BP 147/73 | HR 70 | Resp 12

## 2015-02-21 DIAGNOSIS — G629 Polyneuropathy, unspecified: Secondary | ICD-10-CM | POA: Diagnosis not present

## 2015-02-21 DIAGNOSIS — E1342 Other specified diabetes mellitus with diabetic polyneuropathy: Secondary | ICD-10-CM

## 2015-02-21 DIAGNOSIS — L84 Corns and callosities: Secondary | ICD-10-CM

## 2015-02-21 DIAGNOSIS — Q828 Other specified congenital malformations of skin: Secondary | ICD-10-CM

## 2015-02-21 DIAGNOSIS — E1142 Type 2 diabetes mellitus with diabetic polyneuropathy: Secondary | ICD-10-CM

## 2015-02-21 NOTE — Progress Notes (Signed)
   Subjective:    Patient ID: Bridget Owens, female    DOB: 1955-12-13, 59 y.o.   MRN: [id number]  HPI  N-SORE, DISCOLORATION L-B/L FEET D-10 YEARS O-SLOWLY C-WORSE, THICK SKIN A-PRESSURE T-SOAK WITH EPSON SALT  Review of Systems  Eyes: Positive for pain and itching.  Skin: Positive for color change.  Neurological: Positive for headaches.   Patient denies any history of foot ulceration or claudication    Objective:   Physical Exam  Orientated 3 DP pulses 2/4 bilaterally PT pulses 2/4 bilaterally Capillary reflex immediate bilaterally  Neurological: Sensation to 10 g monofilament wire intact 5/5 bilaterally Vibratory sensation nonreactive right reactive left Ankle reflexes reactive bilaterally  Dermatological: Multiple nucleated keratoses plantar first third fifth MPJ right and fifth MPJ left No open wounds noted bilaterally  Musculoskeletal: No deformities noted bilaterally No restriction in the ankle, subtalar, midtarsal joints bilaterally      Assessment & Plan:   Assessment: Satisfactory vascular status Diabetic with a history of peripheral neuropathy Porokeratosis 4  Plan: Review of the results of examination with patient today Debridement of porokeratosis 4 without any bleeding  Reappoint when necessary at patient's request or yearly

## 2015-02-21 NOTE — Patient Instructions (Signed)
Diabetes and Foot Care Diabetes may cause you to have problems because of poor blood supply (circulation) to your feet and legs. This may cause the skin on your feet to become thinner, break easier, and heal more slowly. Your skin may become dry, and the skin may peel and crack. You may also have nerve damage in your legs and feet causing decreased feeling in them. You may not notice minor injuries to your feet that could lead to infections or more serious problems. Taking care of your feet is one of the most important things you can do for yourself.  HOME CARE INSTRUCTIONS  Wear shoes at all times, even in the house. Do not go barefoot. Bare feet are easily injured.  Check your feet daily for blisters, cuts, and redness. If you cannot see the bottom of your feet, use a mirror or ask someone for help.  Wash your feet with warm water (do not use hot water) and mild soap. Then pat your feet and the areas between your toes until they are completely dry. Do not soak your feet as this can dry your skin.  Apply a moisturizing lotion or petroleum jelly (that does not contain alcohol and is unscented) to the skin on your feet and to dry, brittle toenails. Do not apply lotion between your toes.  Trim your toenails straight across. Do not dig under them or around the cuticle. File the edges of your nails with an emery board or nail file.  Do not cut corns or calluses or try to remove them with medicine.  Wear clean socks or stockings every day. Make sure they are not too tight. Do not wear knee-high stockings since they may decrease blood flow to your legs.  Wear shoes that fit properly and have enough cushioning. To break in new shoes, wear them for just a few hours a day. This prevents you from injuring your feet. Always look in your shoes before you put them on to be sure there are no objects inside.  Do not cross your legs. This may decrease the blood flow to your feet.  If you find a minor scrape,  cut, or break in the skin on your feet, keep it and the skin around it clean and dry. These areas may be cleansed with mild soap and water. Do not cleanse the area with peroxide, alcohol, or iodine.  When you remove an adhesive bandage, be sure not to damage the skin around it.  If you have a wound, look at it several times a day to make sure it is healing.  Do not use heating pads or hot water bottles. They may burn your skin. If you have lost feeling in your feet or legs, you may not know it is happening until it is too late.  Make sure your health care provider performs a complete foot exam at least annually or more often if you have foot problems. Report any cuts, sores, or bruises to your health care provider immediately. SEEK MEDICAL CARE IF:   You have an injury that is not healing.  You have cuts or breaks in the skin.  You have an ingrown nail.  You notice redness on your legs or feet.  You feel burning or tingling in your legs or feet.  You have pain or cramps in your legs and feet.  Your legs or feet are numb.  Your feet always feel cold. SEEK IMMEDIATE MEDICAL CARE IF:   There is increasing redness,   swelling, or pain in or around a wound.  There is a red line that goes up your leg.  Pus is coming from a wound.  You develop a fever or as directed by your health care provider.  You notice a bad smell coming from an ulcer or wound. Document Released: 09/21/2000 Document Revised: 05/27/2013 Document Reviewed: 03/03/2013 Exit[employer] Patient Information 2015 [employer]. This information is not intended to replace advice given to you by your health care provider. Make sure you discuss any questions you have with your health care provider.  

## 2015-03-11 ENCOUNTER — Ambulatory Visit: Payer: Medicaid Other | Admitting: Internal Medicine

## 2015-03-14 ENCOUNTER — Ambulatory Visit (INDEPENDENT_AMBULATORY_CARE_PROVIDER_SITE_OTHER): Payer: Medicaid Other | Admitting: Internal Medicine

## 2015-03-14 ENCOUNTER — Encounter: Payer: Self-pay | Admitting: Internal Medicine

## 2015-03-14 VITALS — BP 152/85 | HR 72 | Temp 97.5°F | Ht 65.0 in | Wt 177.7 lb

## 2015-03-14 DIAGNOSIS — E1142 Type 2 diabetes mellitus with diabetic polyneuropathy: Secondary | ICD-10-CM

## 2015-03-14 DIAGNOSIS — R109 Unspecified abdominal pain: Secondary | ICD-10-CM | POA: Insufficient documentation

## 2015-03-14 DIAGNOSIS — Z79899 Other long term (current) drug therapy: Secondary | ICD-10-CM | POA: Diagnosis not present

## 2015-03-14 DIAGNOSIS — I10 Essential (primary) hypertension: Secondary | ICD-10-CM

## 2015-03-14 DIAGNOSIS — R10815 Periumbilic abdominal tenderness: Secondary | ICD-10-CM | POA: Diagnosis not present

## 2015-03-14 DIAGNOSIS — R1033 Periumbilical pain: Secondary | ICD-10-CM

## 2015-03-14 HISTORY — DX: Unspecified abdominal pain: R10.9

## 2015-03-14 LAB — GLUCOSE, CAPILLARY: Glucose-Capillary: 192 mg/dL — ABNORMAL HIGH (ref 65–99)

## 2015-03-14 MED ORDER — SIMETHICONE 80 MG PO CHEW: 80.0000 mg | CHEWABLE_TABLET | Freq: Four times a day (QID) | ORAL | Status: DC | PRN

## 2015-03-14 NOTE — Patient Instructions (Signed)
1. Please make an appointment for 6 weeks.   2. Please take all medications as previously prescribed with the following changes:  Start taking Simethicone up to 4 times daily as needed for abdominal pain and gas pain.   3. If you have worsening of your symptoms or new symptoms arise, please call the clinic ([phone]), or go to the ER immediately if symptoms are severe.

## 2015-03-14 NOTE — Assessment & Plan Note (Signed)
Lab Results  Component Value Date   HGBA1C 7.8 01/31/2015   HGBA1C 7.0 11/16/2014   HGBA1C 6.8 07/20/2014     Assessment: Comments: Has been taking Metformin 1000 mg bid, says she does not miss this. Had previously been taking Glimepiride 2 mg qAM and for some reason has stopped this medication, says she thought it was discontinued some time ago. It has since fallen off of her med list. Patient does admit to some low blood sugars into the 70's at home on occasion.   Plan: Medications:  continue current medications; Metformin 1000 mg bid ONLY. Will hold off on restarting Sulfonurea given CBG's in the 70's. Given her increase in HbA1c during her last visit, will likely need addition of another medication at her next visit.  Instruction/counseling given: discussed diet Educational resources provided: brochure (denies) Self management tools provided:   Other plans: Check CMP today given complaints of abdominal pain.

## 2015-03-14 NOTE — Assessment & Plan Note (Signed)
BP Readings from Last 3 Encounters:  03/14/15 152/85  02/21/15 147/73  01/31/15 137/74    Lab Results  Component Value Date   NA 139 10/19/2014   K 3.7 10/19/2014   CREATININE 0.62 10/19/2014    Assessment: Comments: BP elevated today, has not taken her BP medications today.   Plan: Medications:  continue current medications; Tenoretic 50-25 daily + Cozaar 100 mg daily Educational resources provided: brochure (denies) Self management tools provided:   Other plans: None

## 2015-03-14 NOTE — Progress Notes (Signed)
Internal Medicine Clinic Attending  Case discussed with Dr. Buskey at the time of the visit.  We reviewed the resident's history and exam and pertinent patient test results.  I agree with the assessment, diagnosis, and plan of care documented in the resident's note.  

## 2015-03-14 NOTE — Progress Notes (Signed)
Subjective:   Patient ID: Bridget Owens female   DOB: January 07, 1956 59 y.o.   MRN: [id number]  HPI: [name] is a 59 y.o. female w/ PMHx of HTN, DM type II, HLD, GERD, and hot flashes, presents to the clinic today for an acute visit for abdominal pain. She says she has had this pain for 2 months now, says it is periumbilical, dull in nature, and 5-7/10 in severity. She claims it comes and goes on its own, but is relieved temporarily with passing a bowel movement. She has not tried any medications for this. She says the pain does not radiate and she also denies any associated nausea, vomiting, fever, chills, constipation, or diarrhea. She says the patient is not associated w/ food, she denies any recent sick contacts, different foods, or recent travel. Patient does have issues w/ GERD and has been taking Protonix for this, says this is a different type of pain. She denies any hematochezia, melena, or hematemesis.   Current Outpatient Prescriptions  Medication Sig Dispense Refill  . acetaminophen (TYLENOL EX ST ARTHRITIS PAIN) 500 MG tablet Take 1 tablet (500 mg total) by mouth every 6 (six) hours as needed. 30 tablet 0  . albuterol (PROVENTIL HFA;VENTOLIN HFA) 108 (90 BASE) MCG/ACT inhaler Inhale 1-2 puffs into the lungs every 6 (six) hours as needed for wheezing or shortness of breath. 1 Inhaler 0  . aspirin 81 MG EC tablet Take 81 mg by mouth daily.      Marland Kitchen atenolol-chlorthalidone (TENORETIC) 50-25 MG per tablet TAKE 1 TABLET BY MOUTH DAILY 90 tablet 1  . atorvastatin (LIPITOR) 40 MG tablet TAKE 1 TABLET BY MOUTH EVERY DAY 30 tablet 5  . benzonatate (TESSALON) 100 MG capsule Take 1 capsule (100 mg total) by mouth 3 (three) times daily as needed for cough. 20 capsule 0  . cetirizine (ZYRTEC) 10 MG tablet Take 1 tablet (10 mg total) by mouth daily. 100 tablet 0  . glucose blood (ACCU-CHEK SMARTVIEW) test strip Use to check sugar once daily as directed 50 each 5  . GLUMETZA 1000 MG 24 hr tablet    9  . ipratropium (ATROVENT) 0.06 % nasal spray Place 2 sprays into the nose 3 (three) times daily. 15 mL 0  . losartan (COZAAR) 100 MG tablet TAKE 1 TABLET BY MOUTH DAILY 30 tablet 5  . metFORMIN (GLUCOPHAGE) 1000 MG tablet Take 1 tablet (1,000 mg total) by mouth 2 (two) times daily with a meal. 60 tablet 11  . pantoprazole (PROTONIX) 40 MG tablet Take 1 tablet (40 mg total) by mouth daily. 30 tablet 2  . sodium chloride (OCEAN) 0.65 % SOLN nasal spray Place 1 spray into both nostrils 4 (four) times daily. 30 mL 0   No current facility-administered medications for this visit.    Review of Systems  General: Denies fever, diaphoresis, appetite change, and fatigue.  Respiratory: Denies SOB, cough, and wheezing.   Cardiovascular: Denies chest pain and palpitations.  Gastrointestinal: Positive for abdominal pain. Denies nausea, vomiting, and diarrhea Musculoskeletal: Denies myalgias, arthralgias, back pain, and gait problem.  Neurological: Denies dizziness, syncope, weakness, lightheadedness, and headaches.  Psychiatric/Behavioral: Denies mood changes, sleep disturbance, and agitation.   Objective:   Physical Exam: Filed Vitals:   03/14/15 1113  BP: 156/84  Pulse: 74  Temp: 97.5 F (36.4 C)  TempSrc: Oral  Height: 5\' 5"  (1.651 m)  Weight: 177 lb 11.2 oz (80.604 kg)  SpO2: 99%    General: AA female,  alert, cooperative, NAD. HEENT: PERRL, EOMI. Moist mucus membranes. Neck: Full range of motion without pain, supple, no lymphadenopathy. Lungs: Clear to ascultation bilaterally, normal work of respiration, no wheezes, rales, rhonchi. Heart: RRR, no murmurs, gallops, or rubs. Abdomen: Soft, only mildly tender in th periumbilical region, non-distended, BS +. Extremities: No cyanosis, clubbing, or edema. No ankle tenderness on exam.  Neurologic: Alert & oriented x3, cranial nerves II-XII intact, strength grossly intact, sensation intact to light touch   Assessment & Plan:   Please  see problem based assessment and plan.

## 2015-03-14 NOTE — Assessment & Plan Note (Signed)
Periumbilical, dull pain, has been present intermittently for 2 months. No associated symptoms. Denies fever, chills, nausea, vomiting, diarrhea, or constipation. No recent sick contacts, travel, or different foods. Only mild periumbilical tenderness on exam. Feel this is most likely gas pain given her description. Improves w/ passing a bowel movement. No tenderness in the RUQ, although prolonged symptoms raises some question of biliary colic even though she denies any association w/ food.  -Continue Protonix 40 mg daily -Check CMP -Start Simethicone q4h prn for gas pain

## 2015-03-15 LAB — COMPLETE METABOLIC PANEL WITH GFR
ALT: 12 U/L (ref 0–35)
AST: 13 U/L (ref 0–37)
Albumin: 4.2 g/dL (ref 3.5–5.2)
Alkaline Phosphatase: 54 U/L (ref 39–117)
BUN: 11 mg/dL (ref 6–23)
CO2: 27 mEq/L (ref 19–32)
Calcium: 10 mg/dL (ref 8.4–10.5)
Chloride: 99 mEq/L (ref 96–112)
Creat: 0.65 mg/dL (ref 0.50–1.10)
GFR, Est African American: >89 mL/min
GFR, Est Non African American: >89 mL/min
Glucose, Bld: 177 mg/dL — ABNORMAL HIGH (ref 70–99)
Potassium: 3.9 mEq/L (ref 3.5–5.3)
Sodium: 137 mEq/L (ref 135–145)
Total Bilirubin: 0.4 mg/dL (ref 0.2–1.2)
Total Protein: 7.1 g/dL (ref 6.0–8.3)

## 2015-04-14 ENCOUNTER — Ambulatory Visit (INDEPENDENT_AMBULATORY_CARE_PROVIDER_SITE_OTHER): Payer: Medicaid Other | Admitting: Internal Medicine

## 2015-04-14 ENCOUNTER — Encounter: Payer: Self-pay | Admitting: Internal Medicine

## 2015-04-14 VITALS — BP 147/80 | HR 65 | Temp 97.7°F | Wt 179.6 lb

## 2015-04-14 DIAGNOSIS — E1142 Type 2 diabetes mellitus with diabetic polyneuropathy: Secondary | ICD-10-CM

## 2015-04-14 DIAGNOSIS — M79604 Pain in right leg: Secondary | ICD-10-CM

## 2015-04-14 DIAGNOSIS — M79606 Pain in leg, unspecified: Secondary | ICD-10-CM | POA: Insufficient documentation

## 2015-04-14 LAB — GLUCOSE, CAPILLARY: Glucose-Capillary: 160 mg/dL — ABNORMAL HIGH (ref 65–99)

## 2015-04-14 NOTE — Progress Notes (Signed)
Subjective:    Patient ID: Bridget Owens, female    DOB: 12-17-1955, 59 y.o.   MRN: [id number]  HPI Comments: [name] is a 59 year old with PMH as below here for leg pain x several months.  Please see problem based charting for assessment and plan.     Past Medical History  Diagnosis Date  . Bell's palsy 01/2009    Left sided  . Type II diabetes mellitus   . Hypertension   . Hyperlipidemia   . Allergic rhinitis   . Intolerance, drug     Leg cramps on Lipitor  . GERD (gastroesophageal reflux disease)   . Uterine fibroid   . Chest pain     Exercise stress test negative 6/07  . Postmenopausal symptoms     Hot flashes, vaginal dryness, iritability, difficulty sleeping. as of 2005.  Marland Kitchen Insomnia   . External hemorrhoid   . Lipoma 12/11    Posterior neck.   . Osteoarthritis     Carpometacarpal joint of right thumb  . Trigger finger     Right thumb  . Sebaceous cyst of breast     Has been refered to derm.  . Adenomatous colon polyp 6/09    Resected on colonoscopy, no high grade dysplasia.   . Hypertensive retinopathy     Followed by Dr. Herbert Deaner.  . Diabetic peripheral neuropathy   . Postmenopausal bleeding 9/05    Endometrial biopsy showed  FOCAL TUBAL METAPLASIA   . Colon polyps     03/22/2008: adenomatous polyps x3 w no dysplasia. Needs repeat colonoscopy in 3 years   Current Outpatient Prescriptions on File Prior to Visit  Medication Sig Dispense Refill  . acetaminophen (TYLENOL EX ST ARTHRITIS PAIN) 500 MG tablet Take 1 tablet (500 mg total) by mouth every 6 (six) hours as needed. 30 tablet 0  . albuterol (PROVENTIL HFA;VENTOLIN HFA) 108 (90 BASE) MCG/ACT inhaler Inhale 1-2 puffs into the lungs every 6 (six) hours as needed for wheezing or shortness of breath. 1 Inhaler 0  . aspirin 81 MG EC tablet Take 81 mg by mouth daily.      Marland Kitchen atenolol-chlorthalidone (TENORETIC) 50-25 MG per tablet TAKE 1 TABLET BY MOUTH DAILY 90 tablet 1  . atorvastatin (LIPITOR) 40 MG tablet TAKE  1 TABLET BY MOUTH EVERY DAY 30 tablet 5  . cetirizine (ZYRTEC) 10 MG tablet Take 1 tablet (10 mg total) by mouth daily. 100 tablet 0  . glucose blood (ACCU-CHEK SMARTVIEW) test strip Use to check sugar once daily as directed 50 each 5  . GLUMETZA 1000 MG 24 hr tablet   9  . ipratropium (ATROVENT) 0.06 % nasal spray Place 2 sprays into the nose 3 (three) times daily. 15 mL 0  . losartan (COZAAR) 100 MG tablet TAKE 1 TABLET BY MOUTH DAILY 30 tablet 5  . metFORMIN (GLUCOPHAGE) 1000 MG tablet Take 1 tablet (1,000 mg total) by mouth 2 (two) times daily with a meal. 60 tablet 11  . pantoprazole (PROTONIX) 40 MG tablet Take 1 tablet (40 mg total) by mouth daily. 30 tablet 2  . simethicone (GAS-X) 80 MG chewable tablet Chew 1 tablet (80 mg total) by mouth 4 (four) times daily as needed for flatulence. 100 tablet 2  . sodium chloride (OCEAN) 0.65 % SOLN nasal spray Place 1 spray into both nostrils 4 (four) times daily. 30 mL 0   No current facility-administered medications on file prior to visit.    Review of Systems  Constitutional: Negative for fever, chills and appetite change.  Respiratory: Negative for shortness of breath.   Cardiovascular: Negative for chest pain and leg swelling.  Gastrointestinal: Negative for nausea, vomiting, abdominal pain, diarrhea, constipation and blood in stool.  Genitourinary: Negative for dysuria and difficulty urinating.  Musculoskeletal: Negative for back pain.       Filed Vitals:   04/14/15 1401  BP: 147/80  Pulse: 65  Temp: 97.7 F (36.5 C)  TempSrc: Oral  Weight: 179 lb 9.6 oz (81.466 kg)  SpO2: 100%     Objective:   Physical Exam  Constitutional: She is oriented to person, place, and time. She appears well-developed. No distress.  HENT:  Head: Normocephalic and atraumatic.  Mouth/Throat: Oropharynx is clear and moist. No oropharyngeal exudate.  Eyes: EOM are normal. Pupils are equal, round, and reactive to light.  Neck: Neck supple.    Cardiovascular: Normal rate, regular rhythm, normal heart sounds and intact distal pulses.  Exam reveals no gallop and no friction rub.   No murmur heard. 2+ DPs B/L  Pulmonary/Chest: Effort normal and breath sounds normal. No respiratory distress. She has no wheezes. She has no rales.  Abdominal: Soft. Bowel sounds are normal.  Musculoskeletal: Normal range of motion. She exhibits tenderness. She exhibits no edema.  Anterior leg TTP.  Neurological: She is alert and oriented to person, place, and time. No cranial nerve deficit.  Strength and sensation grossly intact upper and lower extremities.  Skin: Skin is warm and dry. No rash noted. She is not diaphoretic. No erythema.  Legs/feet have normal color, warmth.  There are no rashes, swelling, ulcers/wounds or signs of infection.  Psychiatric: She has a normal mood and affect. Her behavior is normal.  Vitals reviewed.         Assessment & Plan:  Please see problem based charting for assessment and plan.

## 2015-04-14 NOTE — Assessment & Plan Note (Addendum)
No leg swelling, calf pain or risk factors to suggest DVT.  She is a smoker but has good DPs and pain is not worse with exercise or improved with rest to suggest PAD.  No prior injury or surgery to right leg.  Hx in the chart indicates she has had leg cramping with Lipitor in the past.  She cannot remember.  I initially planned to stop statin to see if she improved, however she told me at the end of the visit that she stopped Lipitor 1 month ago because her legs were swollen and she thought the Lipitor caused it.  Unilateral presentation would be unusual and since she has been off of statin with no improvement this is probably not statin myopathy.  She engages in walking but not running so I do not think this is shin splint.  Cramping is worse at night but recent electrolytes normal and she tells me she stays hydrated with water so less likely due to electrolyte abnormality.  Etiology is unclear but it does not seem to be life or limb threatening as it has been going on for months, she is able to walk/function and her exam is benign. - advised her to use ice or heat to front of leg and prn Tylenol for symptoms - will plan to check vitamin D at follow-up visit in 2 weeks - can refer to PT if she is still having pain

## 2015-04-14 NOTE — Patient Instructions (Signed)
1. Please stop your atorvastatin for the next week and see if your leg pain improves.  Try ice or heat to your leg when it gives you pain.  You can continue Tylenol if needed for pain. I will call you if there are problems with your labs.  Please come back to see me in 1-2 weeks.  2. Please take all medications as prescribed.   3. If you have worsening of your symptoms or new symptoms arise, please call the clinic ([phone]), or go to the ER immediately if symptoms are severe.

## 2015-04-19 NOTE — Progress Notes (Signed)
Internal Medicine Clinic Attending  Case discussed with Dr. Wilson soon after the resident saw the patient.  We reviewed the resident's history and exam and pertinent patient test results.  I agree with the assessment, diagnosis, and plan of care documented in the resident's note.  

## 2015-05-25 ENCOUNTER — Other Ambulatory Visit: Payer: Self-pay | Admitting: Internal Medicine

## 2015-06-03 ENCOUNTER — Telehealth: Payer: Self-pay | Admitting: *Deleted

## 2015-06-03 ENCOUNTER — Emergency Department (HOSPITAL_COMMUNITY)
Admission: EM | Admit: 2015-06-03 | Discharge: 2015-06-03 | Disposition: A | Discharge status: Home / Self Care | Payer: Medicaid Other | Attending: Emergency Medicine | Admitting: Emergency Medicine

## 2015-06-03 ENCOUNTER — Emergency Department (HOSPITAL_COMMUNITY): Payer: Medicaid Other

## 2015-06-03 ENCOUNTER — Encounter (HOSPITAL_COMMUNITY): Payer: Self-pay | Admitting: *Deleted

## 2015-06-03 DIAGNOSIS — M1990 Unspecified osteoarthritis, unspecified site: Secondary | ICD-10-CM | POA: Insufficient documentation

## 2015-06-03 DIAGNOSIS — I10 Essential (primary) hypertension: Secondary | ICD-10-CM | POA: Insufficient documentation

## 2015-06-03 DIAGNOSIS — Z86010 Personal history of colonic polyps: Secondary | ICD-10-CM | POA: Diagnosis not present

## 2015-06-03 DIAGNOSIS — Z79899 Other long term (current) drug therapy: Secondary | ICD-10-CM | POA: Diagnosis not present

## 2015-06-03 DIAGNOSIS — R05 Cough: Secondary | ICD-10-CM | POA: Diagnosis not present

## 2015-06-03 DIAGNOSIS — R059 Cough, unspecified: Secondary | ICD-10-CM

## 2015-06-03 DIAGNOSIS — Z720 Tobacco use: Secondary | ICD-10-CM | POA: Diagnosis not present

## 2015-06-03 DIAGNOSIS — Z86018 Personal history of other benign neoplasm: Secondary | ICD-10-CM | POA: Insufficient documentation

## 2015-06-03 DIAGNOSIS — K219 Gastro-esophageal reflux disease without esophagitis: Secondary | ICD-10-CM | POA: Diagnosis not present

## 2015-06-03 DIAGNOSIS — E1140 Type 2 diabetes mellitus with diabetic neuropathy, unspecified: Secondary | ICD-10-CM | POA: Insufficient documentation

## 2015-06-03 DIAGNOSIS — Z8669 Personal history of other diseases of the nervous system and sense organs: Secondary | ICD-10-CM | POA: Diagnosis not present

## 2015-06-03 DIAGNOSIS — R0602 Shortness of breath: Secondary | ICD-10-CM | POA: Diagnosis present

## 2015-06-03 DIAGNOSIS — E785 Hyperlipidemia, unspecified: Secondary | ICD-10-CM | POA: Diagnosis not present

## 2015-06-03 DIAGNOSIS — Z7982 Long term (current) use of aspirin: Secondary | ICD-10-CM | POA: Diagnosis not present

## 2015-06-03 LAB — BASIC METABOLIC PANEL
Anion gap: 10 (ref 5–15)
BUN: 11 mg/dL (ref 6–20)
CO2: 25 mmol/L (ref 22–32)
Calcium: 9.5 mg/dL (ref 8.9–10.3)
Chloride: 99 mmol/L — ABNORMAL LOW (ref 101–111)
Creatinine, Ser: 0.71 mg/dL (ref 0.44–1.00)
GFR calc Af Amer: >60 mL/min (ref >60)
GFR calc non Af Amer: >60 mL/min (ref >60)
Glucose, Bld: 253 mg/dL — ABNORMAL HIGH (ref 65–99)
Potassium: 3.4 mmol/L — ABNORMAL LOW (ref 3.5–5.1)
Sodium: 134 mmol/L — ABNORMAL LOW (ref 135–145)

## 2015-06-03 LAB — CBC
HCT: 37.4 % (ref 36.0–46.0)
Hemoglobin: 12.6 g/dL (ref 12.0–15.0)
MCH: 29.2 pg (ref 26.0–34.0)
MCHC: 33.7 g/dL (ref 30.0–36.0)
MCV: 86.6 fL (ref 78.0–100.0)
Platelets: 249 10*3/uL (ref 150–400)
RBC: 4.32 MIL/uL (ref 3.87–5.11)
RDW: 12.6 % (ref 11.5–15.5)
WBC: 5.8 10*3/uL (ref 4.0–10.5)

## 2015-06-03 LAB — I-STAT TROPONIN, ED: Troponin i, poc: 0 ng/mL (ref 0.00–0.08)

## 2015-06-03 LAB — BRAIN NATRIURETIC PEPTIDE: B Natriuretic Peptide: 21.3 pg/mL (ref 0.0–100.0)

## 2015-06-03 MED ORDER — ALBUTEROL SULFATE (2.5 MG/3ML) 0.083% IN NEBU
2.5000 mg | INHALATION_SOLUTION | Freq: Once | RESPIRATORY_TRACT | Status: AC
  Administered 2015-06-03: 2.5 mg via RESPIRATORY_TRACT
  Filled 2015-06-03: qty 3

## 2015-06-03 NOTE — ED Notes (Signed)
Finished breathing treatment

## 2015-06-03 NOTE — Telephone Encounter (Signed)
Pt called SOB for past week. Painting her appt. Refuses to go to ER. Appt made 06/06/15 2:45PM Dr Arcelia Jew. If any changes suggest ER. Hilda Blades Risa Auman RN 06/03/15 3:45PM

## 2015-06-03 NOTE — ED Notes (Signed)
Pt reports having runny nose, productive cough and sob x 10 days since they started painting her house. No resp distress noted, ekg done.

## 2015-06-03 NOTE — ED Provider Notes (Signed)
CSN: [id number]     Arrival date & time 06/03/15  1836 History   First MD Initiated Contact with Patient 06/03/15 2108     Chief Complaint  Patient presents with  . Shortness of Breath  . Cough     (Consider location/radiation/quality/duration/timing/severity/associated sxs/prior Treatment) Patient is a 58 y.o. female presenting with shortness of breath and cough. The history is provided by the patient. No language interpreter was used.  Shortness of Breath Severity:  Mild Onset quality:  Gradual Duration:  10 days Timing:  Sporadic Progression:  Waxing and waning Chronicity:  New Context: strong odors   Associated symptoms: cough   Associated symptoms: no fever   Cough Associated symptoms: shortness of breath   Associated symptoms: no fever     Past Medical History  Diagnosis Date  . Bell's palsy 01/2009    Left sided  . Type II diabetes mellitus   . Hypertension   . Hyperlipidemia   . Allergic rhinitis   . Intolerance, drug     Leg cramps on Lipitor  . GERD (gastroesophageal reflux disease)   . Uterine fibroid   . Chest pain     Exercise stress test negative 6/07  . Postmenopausal symptoms     Hot flashes, vaginal dryness, iritability, difficulty sleeping. as of 2005.  Marland Kitchen Insomnia   . External hemorrhoid   . Lipoma 12/11    Posterior neck.   . Osteoarthritis     Carpometacarpal joint of right thumb  . Trigger finger     Right thumb  . Sebaceous cyst of breast     Has been refered to derm.  . Adenomatous colon polyp 6/09    Resected on colonoscopy, no high grade dysplasia.   . Hypertensive retinopathy     Followed by Dr. Herbert Deaner.  . Diabetic peripheral neuropathy   . Postmenopausal bleeding 9/05    Endometrial biopsy showed  FOCAL TUBAL METAPLASIA   . Colon polyps     03/22/2008: adenomatous polyps x3 w no dysplasia. Needs repeat colonoscopy in 3 years   Past Surgical History  Procedure Laterality Date  . Tubal ligation     Family History  Problem  Relation Age of Onset  . Pneumonia Mother   . Diabetes Mother   . Early death Father   . Diabetes Sister   . Diabetes Brother   . Diabetes Maternal Grandmother    Social History  Substance Use Topics  . Smoking status: Current Every Day Smoker -- 0.30 packs/day for 30 years    Types: Cigarettes  . Smokeless tobacco: Former Systems developer    Quit date: 11/28/2010     Comment: trying to quit .  Doing a little less now.  . Alcohol Use: No   OB History    No data available     Review of Systems  Constitutional: Negative for fever.  Respiratory: Positive for cough and shortness of breath.   All other systems reviewed and are negative.     Allergies  Codeine  Home Medications   Prior to Admission medications   Medication Sig Start Date End Date Taking? Authorizing Provider  aspirin 81 MG EC tablet Take 81 mg by mouth daily.     Yes Historical Provider, MD  atenolol-chlorthalidone (TENORETIC) 50-25 MG per tablet TAKE 1 TABLET BY MOUTH DAILY 12/06/14  Yes Corky Sox, MD  latanoprost (XALATAN) 0.005 % ophthalmic solution Place 1 drop into both eyes at bedtime. 03/05/15  Yes Historical Provider, MD  losartan (COZAAR)  100 MG tablet TAKE 1 TABLET BY MOUTH DAILY 05/26/15  Yes Corky Sox, MD  metFORMIN (GLUCOPHAGE) 1000 MG tablet Take 1 tablet (1,000 mg total) by mouth 2 (two) times daily with a meal. 02/03/15 02/03/16 Yes Oval Linsey, MD  acetaminophen (TYLENOL EX ST ARTHRITIS PAIN) 500 MG tablet Take 1 tablet (500 mg total) by mouth every 6 (six) hours as needed. 10/05/14   Blain Pais, MD  albuterol (PROVENTIL HFA;VENTOLIN HFA) 108 (90 BASE) MCG/ACT inhaler Inhale 1-2 puffs into the lungs every 6 (six) hours as needed for wheezing or shortness of breath. 07/24/14   Margarita Mail, PA-C  atorvastatin (LIPITOR) 40 MG tablet TAKE 1 TABLET BY MOUTH EVERY DAY Patient not taking: Reported on 06/03/2015 12/06/14   Corky Sox, MD  cetirizine (ZYRTEC) 10 MG tablet Take 1 tablet (10 mg total)  by mouth daily. Patient not taking: Reported on 04/14/2015 10/05/14   Blain Pais, MD  glucose blood (ACCU-CHEK SMARTVIEW) test strip Use to check sugar once daily as directed 12/30/14   Oval Linsey, MD  ipratropium (ATROVENT) 0.06 % nasal spray Place 2 sprays into the nose 3 (three) times daily. 10/19/14   Charlesetta Shanks, MD  pantoprazole (PROTONIX) 40 MG tablet Take 1 tablet (40 mg total) by mouth daily. 11/16/14 11/16/15  Corky Sox, MD  simethicone (GAS-X) 80 MG chewable tablet Chew 1 tablet (80 mg total) by mouth 4 (four) times daily as needed for flatulence. 03/14/15 03/13/16  Corky Sox, MD  sodium chloride (OCEAN) 0.65 % SOLN nasal spray Place 1 spray into both nostrils 4 (four) times daily. 10/05/14   Blain Pais, MD   BP 128/58 mmHg  Pulse 69  Temp(Src) 98 F (36.7 C) (Oral)  Resp 19  SpO2 93% Physical Exam  Constitutional: She is oriented to person, place, and time. She appears well-developed and well-nourished. No distress.  Eyes: Pupils are equal, round, and reactive to light.  Neck: Neck supple.  Cardiovascular: Normal rate and regular rhythm.   Pulmonary/Chest: Effort normal and breath sounds normal.  Abdominal: Soft. Bowel sounds are normal.  Musculoskeletal: She exhibits no edema or tenderness.  Lymphadenopathy:    She has no cervical adenopathy.  Neurological: She is alert and oriented to person, place, and time.  Skin: Skin is warm and dry.  Psychiatric: She has a normal mood and affect.  Nursing note and vitals reviewed.   ED Course  Procedures (including critical care time) Labs Review Labs Reviewed  BASIC METABOLIC PANEL - Abnormal; Notable for the following:    Sodium 134 (*)    Potassium 3.4 (*)    Chloride 99 (*)    Glucose, Bld 253 (*)    All other components within normal limits  CBC  BRAIN NATRIURETIC PEPTIDE  I-STAT TROPOININ, ED    Imaging Review Dg Chest 2 View  06/03/2015   CLINICAL DATA:  SOB. Pt reports having runny nose,  productive cough and sob x 10 days since they started painting her house. Pt also states mold was found in her daughter's home in April and pt has spent some time at her house since then. Hx HTN controlled with medication, diabetes, and current smoker of 1 PPD.  EXAM: CHEST  2 VIEW  COMPARISON:  07/24/2014  FINDINGS: Cardiac silhouette normal in size and configuration. Normal mediastinal and hilar contours.  Clear lungs.  No pleural effusion or pneumothorax.  Bony thorax is intact.  IMPRESSION: No active cardiopulmonary disease.  Electronically Signed   By: Lajean Manes M.D.   On: 06/03/2015 20:13   I have personally reviewed and evaluated these images and lab results as part of my medical decision-making.   EKG Interpretation None     Patient given albuterol neb in ED with improvement in symptoms. Discharged home with return precautions discussed. Follow-up with her PCP in the [facility]. MDM   Final diagnoses:  None    Cough, likely due to respiratory irritant. Improved with albuterol.    Etta Quill, NP 06/04/15 0100  Forde Dandy, MD 06/04/15 1322

## 2015-06-03 NOTE — Discharge Instructions (Signed)
Cough, Adult   A cough is a reflex. It helps you clear your throat and airways. A cough can help heal your body. A cough can last 2 or 3 weeks (acute) or may last more than 8 weeks (chronic). Some common causes of a cough can include an infection, allergy, or a cold.  HOME CARE  · Only take medicine as told by your doctor.  · If given, take your medicines (antibiotics) as told. Finish them even if you start to feel better.  · Use a cold steam vaporizer or humidifier in your home. This can help loosen thick spit (secretions).  · Sleep so you are almost sitting up (semi-upright). Use pillows to do this. This helps reduce coughing.  · Rest as needed.  · Stop smoking if you smoke.  GET HELP RIGHT AWAY IF:  · You have yellowish-white fluid (pus) in your thick spit.  · Your cough gets worse.  · Your medicine does not reduce coughing, and you are losing sleep.  · You cough up blood.  · You have trouble breathing.  · Your pain gets worse and medicine does not help.  · You have a fever.  MAKE SURE YOU:   · Understand these instructions.  · Will watch your condition.  · Will get help right away if you are not doing well or get worse.  Document Released: 06/07/2011 Document Revised: 02/08/2014 Document Reviewed: 06/07/2011  ExitCare® Patient Information ©2015 [employer]. This information is not intended to replace advice given to you by your health care provider. Make sure you discuss any questions you have with your health care provider.

## 2015-06-03 NOTE — ED Notes (Signed)
MD at bedside. 

## 2015-06-06 ENCOUNTER — Ambulatory Visit: Payer: Medicaid Other | Admitting: Internal Medicine

## 2015-06-06 NOTE — Telephone Encounter (Signed)
Tried to reach pt [phone]  about appt 06/06/15 2:45PM Dr Arcelia Jew.  Called pt several times 06/03/15 and no ID recording. Again 06/06/15 called pt 11:30AM with no answer. Hilda Blades Stephen Turnbaugh RN 06/06/15 11:30AM

## 2015-06-07 ENCOUNTER — Encounter: Payer: Self-pay | Admitting: Internal Medicine

## 2015-07-12 ENCOUNTER — Ambulatory Visit (INDEPENDENT_AMBULATORY_CARE_PROVIDER_SITE_OTHER): Payer: Medicaid Other | Admitting: Internal Medicine

## 2015-07-12 ENCOUNTER — Encounter: Payer: Self-pay | Admitting: Internal Medicine

## 2015-07-12 VITALS — BP 130/68 | HR 71 | Temp 97.5°F | Ht 65.0 in | Wt 179.0 lb

## 2015-07-12 DIAGNOSIS — E1165 Type 2 diabetes mellitus with hyperglycemia: Secondary | ICD-10-CM

## 2015-07-12 DIAGNOSIS — Z0000 Encounter for general adult medical examination without abnormal findings: Secondary | ICD-10-CM

## 2015-07-12 DIAGNOSIS — I10 Essential (primary) hypertension: Secondary | ICD-10-CM

## 2015-07-12 DIAGNOSIS — Z79899 Other long term (current) drug therapy: Secondary | ICD-10-CM | POA: Diagnosis not present

## 2015-07-12 DIAGNOSIS — E1142 Type 2 diabetes mellitus with diabetic polyneuropathy: Secondary | ICD-10-CM

## 2015-07-12 DIAGNOSIS — Z7984 Long term (current) use of oral hypoglycemic drugs: Secondary | ICD-10-CM

## 2015-07-12 LAB — GLUCOSE, CAPILLARY: Glucose-Capillary: 112 mg/dL — ABNORMAL HIGH (ref 65–99)

## 2015-07-12 LAB — POCT GLYCOSYLATED HEMOGLOBIN (HGB A1C): Hemoglobin A1C: 9.4

## 2015-07-12 MED ORDER — GLIMEPIRIDE 2 MG PO TABS: 2.0000 mg | ORAL_TABLET | Freq: Every day | ORAL | Status: DC

## 2015-07-12 NOTE — Patient Instructions (Signed)
General Instructions:  1. Please make follow up appointment for 6 weeks.   2. Please take all medications as previously prescribed with the following changes:  Start taking Glimepiride 2 mg once daily with breakfast.   Start taking your Metformin 1000 mg TWICE daily. DO NOT TAKE BOTH PILLS IN THE AM.   3. If you have worsening of your symptoms or new symptoms arise, please call the clinic ([phone]), or go to the ER immediately if symptoms are severe.  You have done a great job in taking all your medications. Please continue to do this.  Please bring your medicines with you each time you come to clinic.  Medicines may include prescription medications, over-the-counter medications, herbal remedies, eye drops, vitamins, or other pills.   Progress Toward Treatment Goals:  Treatment Goal 07/12/2015  Hemoglobin A1C deteriorated  Blood pressure at goal  Stop smoking smoking less    Self Care Goals & Plans:  Self Care Goal 07/12/2015  Manage my medications take my medicines as prescribed; bring my medications to every visit; refill my medications on time  Monitor my health keep track of my blood glucose; bring my glucose meter and log to each visit  Eat healthy foods drink diet soda or water instead of juice or soda; eat more vegetables; eat foods that are low in salt; eat baked foods instead of fried foods; eat fruit for snacks and desserts  Be physically active find an activity I enjoy  Stop smoking set a quit date and stop smoking  Meeting treatment goals maintain the current self-care plan    Home Blood Glucose Monitoring 07/12/2015  Check my blood sugar once a day  When to check my blood sugar before breakfast     Care Management & Community Referrals:  Referral 07/12/2015  Referrals made for care management support none needed  Referrals made to community resources none

## 2015-07-12 NOTE — Progress Notes (Signed)
Subjective:   Patient ID: Bridget Owens female   DOB: September 24, 1956 59 y.o.   MRN: [id number]  HPI: Bridget Owens is a 59 y.o. female w/ PMHx of HTN, DM type II, HLD, GERD, and hot flashes, presents to the clinic today for follow up visit for her DM and HTN. Patient has some non-specific complaints including some mild hand pain that comes and goes (currently not bothering her). She does state that her blood sugars have been mostly in the 200's recently despite taking her Metformin. She states she has not been eating as healthy recently and has not been exercising. She also states she has been taking her Metformin as 2000 mg once daily in the AM rather than 1000 mg bid. She denies any significant hypoglycemic symptoms but does admit to one blood sugar in the 70's back in August. She also says that since her blood sugars have been higher over the recent past that she started taking her Amaryl again in the morning, but she has not been consistent with this.   Current Outpatient Prescriptions  Medication Sig Dispense Refill  . acetaminophen (TYLENOL EX ST ARTHRITIS PAIN) 500 MG tablet Take 1 tablet (500 mg total) by mouth every 6 (six) hours as needed. 30 tablet 0  . albuterol (PROVENTIL HFA;VENTOLIN HFA) 108 (90 BASE) MCG/ACT inhaler Inhale 1-2 puffs into the lungs every 6 (six) hours as needed for wheezing or shortness of breath. 1 Inhaler 0  . aspirin 81 MG EC tablet Take 81 mg by mouth daily.      Marland Kitchen atenolol-chlorthalidone (TENORETIC) 50-25 MG per tablet TAKE 1 TABLET BY MOUTH DAILY 90 tablet 1  . atorvastatin (LIPITOR) 40 MG tablet TAKE 1 TABLET BY MOUTH EVERY DAY (Patient not taking: Reported on 06/03/2015) 30 tablet 5  . cetirizine (ZYRTEC) 10 MG tablet Take 1 tablet (10 mg total) by mouth daily. (Patient not taking: Reported on 04/14/2015) 100 tablet 0  . glimepiride (AMARYL) 2 MG tablet Take 1 tablet (2 mg total) by mouth daily with breakfast. 30 tablet 5  . glucose blood (ACCU-CHEK SMARTVIEW)  test strip Use to check sugar once daily as directed 50 each 5  . ipratropium (ATROVENT) 0.06 % nasal spray Place 2 sprays into the nose 3 (three) times daily. 15 mL 0  . latanoprost (XALATAN) 0.005 % ophthalmic solution Place 1 drop into both eyes at bedtime.  4  . losartan (COZAAR) 100 MG tablet TAKE 1 TABLET BY MOUTH DAILY 30 tablet 5  . metFORMIN (GLUCOPHAGE) 1000 MG tablet Take 1 tablet (1,000 mg total) by mouth 2 (two) times daily with a meal. 60 tablet 11  . pantoprazole (PROTONIX) 40 MG tablet Take 1 tablet (40 mg total) by mouth daily. 30 tablet 2  . simethicone (GAS-X) 80 MG chewable tablet Chew 1 tablet (80 mg total) by mouth 4 (four) times daily as needed for flatulence. 100 tablet 2  . sodium chloride (OCEAN) 0.65 % SOLN nasal spray Place 1 spray into both nostrils 4 (four) times daily. 30 mL 0   No current facility-administered medications for this visit.    Review of Systems  General: Denies fever, diaphoresis, appetite change, and fatigue.  Respiratory: Denies SOB, cough, and wheezing.   Cardiovascular: Denies chest pain and palpitations.  Gastrointestinal: Denies nausea, vomiting, abdominal pain, and diarrhea Musculoskeletal: Positive for mild, intermittent right hand pain. Denies myalgias, back pain, and gait problem.  Neurological: Denies dizziness, syncope, weakness, lightheadedness, and headaches.  Psychiatric/Behavioral: Denies mood  changes, sleep disturbance, and agitation.    Objective:   Physical Exam: Filed Vitals:   07/12/15 1502  BP: 130/68  Pulse: 71  Temp: 97.5 F (36.4 C)  TempSrc: Oral  Height: 5\' 5"  (1.651 m)  Weight: 179 lb (81.194 kg)  SpO2: 99%    General: Alert, cooperative, NAD. HEENT: PERRL, EOMI. Moist mucus membranes Neck: Full range of motion without pain, supple, no lymphadenopathy or carotid bruits Lungs: Clear to ascultation bilaterally, normal work of respiration, no wheezes, rales, rhonchi Heart: RRR, no murmurs, gallops, or  rubs Abdomen: Soft, non-tender, non-distended, BS + Extremities: No cyanosis, clubbing, or edema Neurologic: Alert & oriented X3, cranial nerves II-XII intact, strength grossly intact, sensation intact to light touch   Assessment & Plan:   Please see problem based assessment and plan.

## 2015-07-13 ENCOUNTER — Other Ambulatory Visit: Payer: Self-pay | Admitting: Internal Medicine

## 2015-07-13 NOTE — Assessment & Plan Note (Signed)
Refused flu shot

## 2015-07-13 NOTE — Assessment & Plan Note (Signed)
BP Readings from Last 3 Encounters:  07/12/15 130/68  06/03/15 128/58  04/14/15 147/80    Lab Results  Component Value Date   NA 134* 06/03/2015   K 3.4* 06/03/2015   CREATININE 0.71 06/03/2015    Assessment: Blood pressure control: controlled Progress toward BP goal:  at goal Comments: Taking Losartan 100 mg daily + Atenolol-Chlorthalidone 50-25 mg daily.   Plan: Medications:  continue current medications Other plans: RTC in 6 weeks for DM follow up

## 2015-07-13 NOTE — Assessment & Plan Note (Signed)
Lab Results  Component Value Date   HGBA1C 9.4 07/12/2015   HGBA1C 7.8 01/31/2015   HGBA1C 7.0 11/16/2014     Assessment: Diabetes control: poor control (HgbA1C >9%) Progress toward A1C goal:  deteriorated Comments: CBG's have steadily increased since her last visit. Average in the 200's. Has been taking Metformin 2000 mg in the AM rather than 1000 mg bid. No recent hypoglycemia.  Plan: Medications:  Continue Metformin 1000 mg BID. reinforced the importance of taking this twice daily. Start Amaryl 2 mg qAM.  Home glucose monitoring: Frequency: once a day Timing: before breakfast Instruction/counseling given: reminded to bring blood glucose meter & log to each visit, reminded to bring medications to each visit, discussed foot care, discussed the need for weight loss and discussed diet Educational resources provided:   Self management tools provided: copy of home glucose meter download Other plans: RTC in 6 weeks. If still elevated, can change to twice daily dosing vs adding Lantus insulin.

## 2015-07-18 NOTE — Progress Notes (Signed)
Internal Medicine Clinic Attending  Case discussed with Dr. Adames soon after the resident saw the patient.  We reviewed the resident's history and exam and pertinent patient test results.  I agree with the assessment, diagnosis, and plan of care documented in the resident's note. 

## 2015-07-26 ENCOUNTER — Other Ambulatory Visit: Payer: Self-pay | Admitting: Internal Medicine

## 2015-08-12 ENCOUNTER — Other Ambulatory Visit: Payer: Self-pay | Admitting: *Deleted

## 2015-08-12 DIAGNOSIS — E118 Type 2 diabetes mellitus with unspecified complications: Secondary | ICD-10-CM

## 2015-08-16 MED ORDER — GLUCOSE BLOOD VI STRP: ORAL_STRIP | Status: DC

## 2015-08-17 ENCOUNTER — Other Ambulatory Visit: Payer: Self-pay | Admitting: Internal Medicine

## 2015-08-25 ENCOUNTER — Ambulatory Visit (INDEPENDENT_AMBULATORY_CARE_PROVIDER_SITE_OTHER): Payer: Medicaid Other | Admitting: Internal Medicine

## 2015-08-25 ENCOUNTER — Encounter: Payer: Self-pay | Admitting: Internal Medicine

## 2015-08-25 VITALS — BP 129/78 | HR 67 | Temp 97.8°F | Ht 68.0 in | Wt 182.7 lb

## 2015-08-25 DIAGNOSIS — B373 Candidiasis of vulva and vagina: Secondary | ICD-10-CM | POA: Insufficient documentation

## 2015-08-25 DIAGNOSIS — E1165 Type 2 diabetes mellitus with hyperglycemia: Secondary | ICD-10-CM | POA: Diagnosis not present

## 2015-08-25 DIAGNOSIS — Z7984 Long term (current) use of oral hypoglycemic drugs: Secondary | ICD-10-CM

## 2015-08-25 DIAGNOSIS — E1142 Type 2 diabetes mellitus with diabetic polyneuropathy: Secondary | ICD-10-CM

## 2015-08-25 DIAGNOSIS — B3731 Acute candidiasis of vulva and vagina: Secondary | ICD-10-CM

## 2015-08-25 MED ORDER — FLUCONAZOLE 150 MG PO TABS: 150.0000 mg | ORAL_TABLET | ORAL | Status: DC

## 2015-08-25 NOTE — Patient Instructions (Signed)
FOR YOUR YEAST INFECTION: TAKE FLUCONAZOLE ONE TABLET EVERY 3 DAYS FOR 3 DOSES. IF YOUR PAIN AND BURNING DO NOT IMPROVE, PLEASE RETURN TO THE CLINIC.  FOR YOUR DIABETES: TAKE METFORMIN 1000 MG TWICE A DAY. TAKE AMARYL 2 MG EVERY MORNING WITH BREAKFAST.  RETURN IN 6 WEEKS FOR FOLLOW UP.

## 2015-08-25 NOTE — Assessment & Plan Note (Signed)
Lab Results  Component Value Date   HGBA1C 9.4 07/12/2015   HGBA1C 7.8 01/31/2015   HGBA1C 7.0 11/16/2014     Assessment: Diabetes control:  Uncontrolled Progress toward A1C goal:   Stagnant Comments: Compliant with metformin 1000 mg BID. Has not been taking amaryl 2 mg daily - only occasionally if her glucose is elevated.   Plan: Medications:  continue current medications, explained to patient to take Amaryl 2 mg every morning with breakfast. Home glucose monitoring: Frequency:  BID Timing:  ACHS Instruction/counseling given: discussed diet Educational resources provided: brochure, handout Self management tools provided: copy of home glucose meter download Other plans: Return in 7 weeks for A1c recheck, would continue to titrate amaryl up to max 8 mg.

## 2015-08-25 NOTE — Progress Notes (Signed)
Subjective:    Patient ID: Bridget Owens, female    DOB: 1956-05-14, 59 y.o.   MRN: [id number]  HPI Bridget Owens is a 59 y.o. female with PMHx of T2DM who presents to the clinic for follow up for T2DM. Please see A&P for the status of the patient's chronic medical problems.   Past Medical History  Diagnosis Date  . Bell's palsy 01/2009    Left sided  . Type II diabetes mellitus ([place])   . Hypertension   . Hyperlipidemia   . Allergic rhinitis   . Intolerance, drug     Leg cramps on Lipitor  . GERD (gastroesophageal reflux disease)   . Uterine fibroid   . Chest pain     Exercise stress test negative 6/07  . Postmenopausal symptoms     Hot flashes, vaginal dryness, iritability, difficulty sleeping. as of 2005.  Marland Kitchen Insomnia   . External hemorrhoid   . Lipoma 12/11    Posterior neck.   . Osteoarthritis     Carpometacarpal joint of right thumb  . Trigger finger     Right thumb  . Sebaceous cyst of breast     Has been refered to derm.  . Adenomatous colon polyp 6/09    Resected on colonoscopy, no high grade dysplasia.   . Hypertensive retinopathy     Followed by Dr. Herbert Deaner.  . Diabetic peripheral neuropathy ([place])   . Postmenopausal bleeding 9/05    Endometrial biopsy showed  FOCAL TUBAL METAPLASIA   . Colon polyps     03/22/2008: adenomatous polyps x3 w no dysplasia. Needs repeat colonoscopy in 3 years    Outpatient Encounter Prescriptions as of 08/25/2015  Medication Sig  . acetaminophen (TYLENOL EX ST ARTHRITIS PAIN) 500 MG tablet Take 1 tablet (500 mg total) by mouth every 6 (six) hours as needed.  Marland Kitchen albuterol (PROVENTIL HFA;VENTOLIN HFA) 108 (90 BASE) MCG/ACT inhaler Inhale 1-2 puffs into the lungs every 6 (six) hours as needed for wheezing or shortness of breath.  Marland Kitchen aspirin 81 MG EC tablet Take 81 mg by mouth daily.    Marland Kitchen atenolol-chlorthalidone (TENORETIC) 50-25 MG tablet TAKE 1 TABLET BY MOUTH DAILY  . atorvastatin (LIPITOR) 40 MG tablet TAKE 1 TABLET BY MOUTH EVERY  DAY (Patient not taking: Reported on 06/03/2015)  . cetirizine (ZYRTEC) 10 MG tablet Take 1 tablet (10 mg total) by mouth daily. (Patient not taking: Reported on 04/14/2015)  . fluconazole (DIFLUCAN) 150 MG tablet Take 1 tablet (150 mg total) by mouth every 3 (three) days.  Marland Kitchen glimepiride (AMARYL) 2 MG tablet Take 1 tablet (2 mg total) by mouth daily with breakfast.  . glucose blood (ACCU-CHEK SMARTVIEW) test strip Use to check blood sugar twice daily. ICD10: E11.9  . ipratropium (ATROVENT) 0.06 % nasal spray Place 2 sprays into the nose 3 (three) times daily.  Marland Kitchen latanoprost (XALATAN) 0.005 % ophthalmic solution Place 1 drop into both eyes at bedtime.  Marland Kitchen losartan (COZAAR) 100 MG tablet TAKE 1 TABLET BY MOUTH DAILY  . metFORMIN (GLUCOPHAGE) 1000 MG tablet Take 1 tablet (1,000 mg total) by mouth 2 (two) times daily with a meal.  . pantoprazole (PROTONIX) 40 MG tablet Take 1 tablet (40 mg total) by mouth daily.  . simethicone (GAS-X) 80 MG chewable tablet Chew 1 tablet (80 mg total) by mouth 4 (four) times daily as needed for flatulence.  . sodium chloride (OCEAN) 0.65 % SOLN nasal spray Place 1 spray into both nostrils 4 (four) times  daily.   No facility-administered encounter medications on file as of 08/25/2015.    Family History  Problem Relation Age of Onset  . Pneumonia Mother   . Diabetes Mother   . Early death Father   . Diabetes Sister   . Diabetes Brother   . Diabetes Maternal Grandmother     Social History   Social History  . Marital Status: Single    Spouse Name: N/A  . Number of Children: N/A  . Years of Education: N/A   Occupational History  . Not on file.   Social History Main Topics  . Smoking status: Current Every Day Smoker -- 0.30 packs/day for 30 years    Types: Cigarettes  . Smokeless tobacco: Former Systems developer    Quit date: 11/28/2010     Comment: trying to quit .  Doing a little less now.  . Alcohol Use: No  . Drug Use: No  . Sexual Activity: Not on file    Other Topics Concern  . Not on file   Social History Narrative   Review of Systems General: Denies fever, chills, fatigue Respiratory: Denies SOB, DOE   Cardiovascular: Denies chest pain and palpitations.  Gastrointestinal: Denies nausea, vomiting, abdominal pain, diarrhea, constipation Genitourinary: Admits to vaginal burning and itching with dysuria. Unsure if she has discharge. Denies urgency, frequency, hematuria, suprapubic pain and flank pain. Neurological: Denies dizziness, headaches, weakness, lightheadedness, numbness     Objective:   Physical Exam Filed Vitals:   08/25/15 1342  BP: 129/78  Pulse: 67  Temp: 97.8 F (36.6 C)  TempSrc: Oral  Height: 5\' 8"  (1.727 m)  Weight: 182 lb 11.2 oz (82.872 kg)  SpO2: 100%   General: Vital signs reviewed.  Patient is well-developed and well-nourished, in no acute distress and cooperative with exam.  Cardiovascular: RRR, S1 normal, S2 normal Pulmonary/Chest: Clear to auscultation bilaterally, no wheezes, rales, or rhonchi. Abdominal: Soft, non-tender, non-distended, BS +, no guarding present. Negative CVA tenderness or suprapubic tenderness. Pelvic exam: normal external genitalia, VULVA: normal appearing vulva with no masses, tenderness or lesions, VAGINA: normal appearing vagina with normal color, no lesions, vaginal discharge - white, creamy, curd-like and thick. Extremities: No lower extremity edema bilaterally Skin: Warm, dry and intact. No rashes or erythema. Psychiatric: Normal mood and affect. speech and behavior is normal. Cognition and memory are normal.      Assessment & Plan:   Please see problem based assessment and plan.

## 2015-08-25 NOTE — Assessment & Plan Note (Addendum)
Patient presents with complaint of vaginal burning and occasional itching for the past 2 months. She does have dysuria, but the burning persists even when she is not urinating. She is unsure if she has any discharge. She has had recurrent yeast infections in the past. She denies fever, chills, increased frequency, urgency, hematuria, suprapubic pain or flank pain. I doubt UTI. Patient has not been sexually active for 5 years. HIV negative in 2014. Pelvic exams reveals white, thick, curd-like discharge consistent with complicated vaginal candidiasis.   Plan: -Fluconazole 150 mg po Q3Days for 3 doses

## 2015-08-29 ENCOUNTER — Other Ambulatory Visit: Payer: Self-pay | Admitting: Internal Medicine

## 2015-08-29 NOTE — Telephone Encounter (Signed)
Phoned to [facility], attempted to call pt at number given but busy x3 attempts

## 2015-08-29 NOTE — Telephone Encounter (Signed)
Pt requesting yeast infection to filled @ [facility].

## 2015-08-30 NOTE — Progress Notes (Signed)
Case discussed with Dr. Marvel Plan soon after the resident saw the patient.  We reviewed the resident's history and exam and pertinent patient test results.  I agree with the assessment, diagnosis and plan of care documented in the resident's note.

## 2015-10-19 ENCOUNTER — Telehealth: Payer: Self-pay | Admitting: *Deleted

## 2015-10-19 NOTE — Telephone Encounter (Signed)
Medication approved through 10/18/2016, pharmacy aware.[name]1/11/20173:43 PM

## 2015-10-19 NOTE — Telephone Encounter (Signed)
  Received PA request from pt's pharmacy for her losartan 100mg  tabs. Medication is preferred after trial and failure of an ACE. Per pt's record she tried lisinopril in 2012. Request submitted online via [place] [employer]. Request sent for review.Regenia Skeeter, Dalanie Kisner Cassady1/11/201712:54 PM       Confirmation #: [id number]  Medicaid ID# [id number]

## 2015-11-17 ENCOUNTER — Other Ambulatory Visit: Payer: Self-pay | Admitting: Oncology

## 2015-11-23 ENCOUNTER — Encounter: Payer: Self-pay | Admitting: Internal Medicine

## 2015-11-23 ENCOUNTER — Ambulatory Visit (INDEPENDENT_AMBULATORY_CARE_PROVIDER_SITE_OTHER): Payer: Medicaid Other | Admitting: Internal Medicine

## 2015-11-23 ENCOUNTER — Telehealth: Payer: Self-pay | Admitting: *Deleted

## 2015-11-23 VITALS — BP 170/81 | HR 74 | Temp 97.5°F | Ht 66.0 in | Wt 182.3 lb

## 2015-11-23 DIAGNOSIS — E1165 Type 2 diabetes mellitus with hyperglycemia: Secondary | ICD-10-CM | POA: Diagnosis not present

## 2015-11-23 DIAGNOSIS — E785 Hyperlipidemia, unspecified: Secondary | ICD-10-CM | POA: Diagnosis not present

## 2015-11-23 DIAGNOSIS — I10 Essential (primary) hypertension: Secondary | ICD-10-CM

## 2015-11-23 DIAGNOSIS — Z9114 Patient's other noncompliance with medication regimen: Secondary | ICD-10-CM | POA: Diagnosis not present

## 2015-11-23 DIAGNOSIS — Z7984 Long term (current) use of oral hypoglycemic drugs: Secondary | ICD-10-CM | POA: Diagnosis not present

## 2015-11-23 DIAGNOSIS — Z8742 Personal history of other diseases of the female genital tract: Secondary | ICD-10-CM

## 2015-11-23 DIAGNOSIS — Z0000 Encounter for general adult medical examination without abnormal findings: Secondary | ICD-10-CM

## 2015-11-23 DIAGNOSIS — E1142 Type 2 diabetes mellitus with diabetic polyneuropathy: Secondary | ICD-10-CM

## 2015-11-23 DIAGNOSIS — B3731 Acute candidiasis of vulva and vagina: Secondary | ICD-10-CM

## 2015-11-23 DIAGNOSIS — B373 Candidiasis of vulva and vagina: Secondary | ICD-10-CM

## 2015-11-23 LAB — POCT GLYCOSYLATED HEMOGLOBIN (HGB A1C): Hemoglobin A1C: 8

## 2015-11-23 LAB — GLUCOSE, CAPILLARY: Glucose-Capillary: 199 mg/dL — ABNORMAL HIGH (ref 65–99)

## 2015-11-23 MED ORDER — OLMESARTAN-AMLODIPINE-HCTZ 40-5-25 MG PO TABS: ORAL_TABLET | ORAL | Status: DC

## 2015-11-23 MED ORDER — ATORVASTATIN CALCIUM 40 MG PO TABS: 40.0000 mg | ORAL_TABLET | Freq: Every day | ORAL | Status: DC

## 2015-11-23 MED ORDER — GLIMEPIRIDE 2 MG PO TABS: 2.0000 mg | ORAL_TABLET | Freq: Every day | ORAL | Status: DC

## 2015-11-23 NOTE — Assessment & Plan Note (Signed)
Refused flu shot

## 2015-11-23 NOTE — Assessment & Plan Note (Addendum)
BP Readings from Last 3 Encounters:  11/23/15 170/81  08/25/15 129/78  07/12/15 130/68    Lab Results  Component Value Date   NA 134* 06/03/2015   K 3.4* 06/03/2015   CREATININE 0.71 06/03/2015    Assessment: Blood pressure control:  Poorly controlled Comments: Has been out of her BP medication for about 2 weeks according to the patient. Has been on Losartan 100 mg + Tenoretic 50-25. Looking back, no clear indication of BB, also does not seem she has ever had adequate BP control. Compliance is constantly an issue. Was initially started on Lisinopril in 2012, had hair loss related to this medication which is why she was changed to ARB. Would like to add CCB for increased BP control.   Plan: Medications:  Discontinue current BP regimen. No clear reason for beta blocker, BP not adequately controlled. START Tribenzor (Olmesartan 40 + Amlodipine 5 + HCTZ 25). This dosage will be equivalent with regards to ARB/thiazide, but feel that the addition of CCB will allow for improved control over the beta blocker, as well as improved compliance with 1 pill. Can increase dose of amlodipine in this formulation if needed in the future.  Educational resources provided: brochure (denied) Self management tools provided:   Other plans: Check BMP today to assess renal function given use of thiazide.  -RTC in 4 weeks for BP recheck.

## 2015-11-23 NOTE — Patient Instructions (Signed)
1. Please return for blood pressure check in 4 weeks.   2. Please take all medications as previously prescribed with the following changes:  [place]. Start taking Tribenzor instead. This is ONE PILL you will take once daily for your blood pressure.   Take Metformin 1000 mg twice daily  TAKE AMARYL 2 MG EVERY MORNING WITH BREAKFAST.   3. If you have worsening of your symptoms or new symptoms arise, please call the clinic [phone] or go to the ER immediately if symptoms are severe.

## 2015-11-23 NOTE — Assessment & Plan Note (Addendum)
Lab Results  Component Value Date   HGBA1C 8.0 11/23/2015   HGBA1C 9.4 07/12/2015   HGBA1C 7.8 01/31/2015     Assessment: Diabetes control:  Poorly controlled, but significantly improved.  Comments: Patient still poorly compliant with Amaryl 2 mg qAM. Says she takes this pill sometimes. No clear reason for poor compliance, denies any adverse effects this this. Has not had symptoms of hypoglycemia. Continues to take Metformin 1000 mg bid  Plan: Medications:  continue current medications; reinforced importance of DAILY Amaryl use.  Home glucose monitoring: Frequency:  every once and a while Instruction/counseling given: reminded to get eye exam, reminded to bring blood glucose meter & log to each visit, reminded to bring medications to each visit, discussed foot care, discussed the need for weight loss and discussed diet Educational resources provided: brochure (denied) Self management tools provided: copy of home glucose meter download Other plans: Referral placed for opthalmology. Sees Dr. Herbert Deaner. Check BMP today. RTC in 4 weeks.

## 2015-11-23 NOTE — Progress Notes (Signed)
Subjective:   Patient ID: CASELYNN JAVAID female   DOB: 13-Jun-1956 60 y.o.   MRN: [id number]  HPI: Ms. LOVE TREST is a 60 y.o. female w/ PMHx of HTN, DM type II, HLD, GERD, and hot flashes, presents to the clinic today for follow up visit for her DM and HTN. Patient has been out of her BP medications for some time, says she hasn't taken any for about 2 weeks. Feels great though. Still complaining of hot flashes. Is taking her Metformin regularly, still misses doses of her Amaryl pretty frequently. No low blood sugars or symptoms to suggests hypoglycemia.   Current Outpatient Prescriptions  Medication Sig Dispense Refill  . acetaminophen (TYLENOL EX ST ARTHRITIS PAIN) 500 MG tablet Take 1 tablet (500 mg total) by mouth every 6 (six) hours as needed. 30 tablet 0  . albuterol (PROVENTIL HFA;VENTOLIN HFA) 108 (90 BASE) MCG/ACT inhaler Inhale 1-2 puffs into the lungs every 6 (six) hours as needed for wheezing or shortness of breath. 1 Inhaler 0  . aspirin 81 MG EC tablet Take 81 mg by mouth daily.      Marland Kitchen atenolol-chlorthalidone (TENORETIC) 50-25 MG tablet TAKE 1 TABLET BY MOUTH DAILY 90 tablet 1  . atorvastatin (LIPITOR) 40 MG tablet TAKE 1 TABLET BY MOUTH EVERY DAY (Patient not taking: Reported on 06/03/2015) 30 tablet 5  . cetirizine (ZYRTEC) 10 MG tablet Take 1 tablet (10 mg total) by mouth daily. (Patient not taking: Reported on 04/14/2015) 100 tablet 0  . fluconazole (DIFLUCAN) 150 MG tablet Take 1 tablet (150 mg total) by mouth every 3 (three) days. 3 tablet 0  . glimepiride (AMARYL) 2 MG tablet Take 1 tablet (2 mg total) by mouth daily with breakfast. 30 tablet 5  . glucose blood (ACCU-CHEK SMARTVIEW) test strip Use to check blood sugar twice daily. ICD10: E11.9 50 each 11  . ipratropium (ATROVENT) 0.06 % nasal spray Place 2 sprays into the nose 3 (three) times daily. 15 mL 0  . latanoprost (XALATAN) 0.005 % ophthalmic solution Place 1 drop into both eyes at bedtime.  4  . losartan (COZAAR) 100  MG tablet TAKE 1 TABLET BY MOUTH DAILY 30 tablet 5  . metFORMIN (GLUCOPHAGE) 1000 MG tablet Take 1 tablet (1,000 mg total) by mouth 2 (two) times daily with a meal. 60 tablet 11  . pantoprazole (PROTONIX) 40 MG tablet Take 1 tablet (40 mg total) by mouth daily. 30 tablet 2  . simethicone (GAS-X) 80 MG chewable tablet Chew 1 tablet (80 mg total) by mouth 4 (four) times daily as needed for flatulence. 100 tablet 2  . sodium chloride (OCEAN) 0.65 % SOLN nasal spray Place 1 spray into both nostrils 4 (four) times daily. 30 mL 0   No current facility-administered medications for this visit.    Review of Systems  General: Positive for hot flashes. Denies fever, diaphoresis, appetite change, and fatigue.  Respiratory: Denies SOB, cough, and wheezing.   Cardiovascular: Denies chest pain and palpitations.  Gastrointestinal: Denies nausea, vomiting, abdominal pain, and diarrhea Musculoskeletal: Denies myalgias, arthralgias, back pain, and gait problem.  Neurological: Denies dizziness, syncope, weakness, lightheadedness, and headaches.  Psychiatric/Behavioral: Denies mood changes, sleep disturbance, and agitation.     Objective:   Physical Exam: Filed Vitals:   11/23/15 1058  BP: 170/81  Pulse: 74  Temp: 97.5 F (36.4 C)  TempSrc: Oral  Height: 5\' 6"  (1.676 m)  Weight: 182 lb 4.8 oz (82.691 kg)  SpO2: 100%  General: AA female, alert, cooperative, NAD. HEENT: PERRL, EOMI. Moist mucus membranes Neck: Full range of motion without pain, supple, no lymphadenopathy or carotid bruits Lungs: Clear to ascultation bilaterally, normal work of respiration, no wheezes, rales, rhonchi Heart: RRR, no murmurs, gallops, or rubs Abdomen: Soft, non-tender, non-distended, BS + Extremities: No cyanosis, clubbing, or edema Neurologic: Alert & oriented X3, cranial nerves II-XII intact, strength grossly intact, sensation intact to light touch   Assessment & Plan:   Please see problem based assessment  and plan.

## 2015-11-23 NOTE — Telephone Encounter (Signed)
Call to Medicaid at [phone] for Prior Authorization for Generic Tribenzor # 30 tablets.  Patient has experienced hair loss with Ace Inhibitors.  Prior Authorization approved 11/25/2015 through 11/17/2016.  Approval Code [id number].  Sander Nephew, RN 11/23/2015 12:06 PM

## 2015-11-23 NOTE — Assessment & Plan Note (Signed)
Refilled Atorvastatin.

## 2015-11-23 NOTE — Assessment & Plan Note (Signed)
No further issues with this.

## 2015-11-24 LAB — BMP8+ANION GAP
Anion Gap: 21 mmol/L — ABNORMAL HIGH (ref 10.0–18.0)
BUN/Creatinine Ratio: 18 (ref 11–26)
BUN: 14 mg/dL (ref 8–27)
CO2: 20 mmol/L (ref 18–29)
Calcium: 9.7 mg/dL (ref 8.7–10.3)
Chloride: 100 mmol/L (ref 96–106)
Creatinine, Ser: 0.77 mg/dL (ref 0.57–1.00)
GFR calc Af Amer: 96 mL/min/{1.73_m2} (ref >59)
GFR calc non Af Amer: 84 mL/min/{1.73_m2} (ref >59)
Glucose: 206 mg/dL — ABNORMAL HIGH (ref 65–99)
Potassium: 4.5 mmol/L (ref 3.5–5.2)
Sodium: 141 mmol/L (ref 134–144)

## 2015-11-25 NOTE — Progress Notes (Signed)
Internal Medicine Clinic Attending  Case discussed with Dr. Thorp soon after the resident saw the patient.  We reviewed the resident's history and exam and pertinent patient test results.  I agree with the assessment, diagnosis, and plan of care documented in the resident's note. 

## 2015-11-29 ENCOUNTER — Encounter: Payer: Medicaid Other | Admitting: Internal Medicine

## 2015-12-05 ENCOUNTER — Telehealth: Payer: Self-pay | Admitting: Internal Medicine

## 2015-12-05 NOTE — Telephone Encounter (Signed)
APPT. REMINDER CALL, PHONE HAS BEEN DISCONNECTED

## 2015-12-06 ENCOUNTER — Other Ambulatory Visit: Payer: Self-pay | Admitting: Internal Medicine

## 2015-12-06 ENCOUNTER — Encounter: Payer: Medicaid Other | Admitting: Internal Medicine

## 2015-12-19 ENCOUNTER — Other Ambulatory Visit: Payer: Self-pay | Admitting: Dietician

## 2015-12-19 ENCOUNTER — Telehealth: Payer: Self-pay | Admitting: Internal Medicine

## 2015-12-19 NOTE — Telephone Encounter (Signed)
APPT REMINDER CALL, PHONE DISCONNECTED

## 2015-12-19 NOTE — Telephone Encounter (Signed)
Called patient's pharmacy  as part of project trying to help patient's with a1C between 8-9% lower their blood sugars to target Last refills of her diabetes medications:  Metformin- picked up on   2/21 60 tablets 1000 mg, also picked up on 10/22/15 and 09/15/15 glimiperide - picked up on 11/22/14  2mg  30 pills, and before that October 2016.

## 2015-12-20 ENCOUNTER — Encounter: Payer: Medicaid Other | Admitting: Internal Medicine

## 2015-12-20 NOTE — Telephone Encounter (Signed)
Tried calling patient as part of project trying to help patient's with a1C between 8-9% lower their blood sugars to target. Her number is not working

## 2015-12-21 ENCOUNTER — Other Ambulatory Visit: Payer: Self-pay | Admitting: Internal Medicine

## 2015-12-21 DIAGNOSIS — E1142 Type 2 diabetes mellitus with diabetic polyneuropathy: Secondary | ICD-10-CM

## 2015-12-26 ENCOUNTER — Ambulatory Visit: Payer: Medicaid Other | Admitting: Internal Medicine

## 2016-01-09 ENCOUNTER — Ambulatory Visit (INDEPENDENT_AMBULATORY_CARE_PROVIDER_SITE_OTHER): Payer: Medicaid Other | Admitting: Internal Medicine

## 2016-01-09 ENCOUNTER — Encounter: Payer: Self-pay | Admitting: Internal Medicine

## 2016-01-09 VITALS — BP 174/80 | HR 83 | Temp 98.2°F | Ht 66.0 in | Wt 185.0 lb

## 2016-01-09 DIAGNOSIS — L299 Pruritus, unspecified: Secondary | ICD-10-CM | POA: Diagnosis not present

## 2016-01-09 DIAGNOSIS — E785 Hyperlipidemia, unspecified: Secondary | ICD-10-CM

## 2016-01-09 DIAGNOSIS — R202 Paresthesia of skin: Secondary | ICD-10-CM | POA: Insufficient documentation

## 2016-01-09 DIAGNOSIS — I10 Essential (primary) hypertension: Secondary | ICD-10-CM | POA: Diagnosis not present

## 2016-01-09 HISTORY — DX: Paresthesia of skin: R20.2

## 2016-01-09 MED ORDER — DIPHENHYDRAMINE HCL 25 MG PO CAPS: 25.0000 mg | ORAL_CAPSULE | Freq: Four times a day (QID) | ORAL | Status: DC | PRN

## 2016-01-09 MED ORDER — CETIRIZINE HCL 5 MG/5ML PO SYRP: 5.0000 mg | ORAL_SOLUTION | Freq: Every day | ORAL | Status: DC

## 2016-01-09 NOTE — Assessment & Plan Note (Signed)
>>  ASSESSMENT AND PLAN FOR NOTALGIA PARESTHETICA WRITTEN ON 01/10/2016  1:17 PM BY TRUONG, DIANA M, MD  Pt states she has had all over body itching x 1 month. She felt it was due to her new blood pressure medication tribenzor which she stopped last week w/o any relief in itching. She also stopped taking her statin 2 days ago w/o improvement in itching. She states she has burning of her skin and then itching. She denies new detergents, new soap, eating new food, and new pets aside from her dog she has had for 6 years. She recently moved in with her daughter in August and does not believe there is any mold in the house. Her granddaughter who lives with her has been experiencing the same sx as her. She has tried benadryl which she states helps sometimes. Itching is not associated w/ hot baths and she states hot water helps with the itching. She has allergies and has not been taking any allergies meds. Denies hx of cancer or liver disease. She also reports rt hand swelling above her thenar eminence that started on 4 days ago but self resolved 2 days later. On exam her right hand not swollen and symmetrical w/ her left hand. She does not have any erythema, rash, scaling, bug bites, hyperpigmentation, or plaques on her skin. During the exam she is rubbing her back against the chair b/c her back is itching. Ddx include atopic dermatitis from the usual factors - pollen, animal dander, mold, ciggarette smoke, although she does not have any skin changes. She has a dx of seasonal allergies and has not been taking a daily antihistamine. Could also be due to xerosis however she does not have any apparent signs of dry skin on exam. Other ddx are primary biliary cholangitis, hepatitis, HIV, illicit substance abuse such as cocaine, autoimmune disorders such as sjogrens, and psychogenic pruritis. Very unlikely could be the norvasc component of tribenzor which has less than 2% chance of causing rash.    - LFTs to rule out liver  disease or elevated alk phos that could suggest PBC-- nl, will not check AMA for PBC since LFTs nl - TSH to r/o thyroid d/o--nl - ferritin nl - given rx for benadryl 25mg  q6h prn and daily zyrtec - if pruritis persists can check HIV, UDS, and further evaluate for underlying malignancy

## 2016-01-09 NOTE — Assessment & Plan Note (Addendum)
Pt states she has had all over body itching x 1 month. She felt it was due to her new blood pressure medication tribenzor which she stopped last week w/o any relief in itching. She also stopped taking her statin 2 days ago w/o improvement in itching. She states she has burning of her skin and then itching. She denies new detergents, new soap, eating new food, and new pets aside from her dog she has had for 6 years. She recently moved in with her daughter in August and does not believe there is any mold in the house. Her granddaughter who lives with her has been experiencing the same sx as her. She has tried benadryl which she states helps sometimes. Itching is not associated w/ hot baths and she states hot water helps with the itching. She has allergies and has not been taking any allergies meds. Denies hx of cancer or liver disease. She also reports rt hand swelling above her thenar eminence that started on 4 days ago but self resolved 2 days later. On exam her right hand not swollen and symmetrical w/ her left hand. She does not have any erythema, rash, scaling, bug bites, hyperpigmentation, or plaques on her skin. During the exam she is rubbing her back against the chair b/c her back is itching. Ddx include atopic dermatitis from the usual factors - pollen, animal dander, mold, ciggarette smoke, although she does not have any skin changes. She has a dx of seasonal allergies and has not been taking a daily antihistamine. Could also be due to xerosis however she does not have any apparent signs of dry skin on exam. Other ddx are primary biliary cholangitis, hepatitis, HIV, illicit substance abuse such as cocaine, autoimmune disorders such as sjogrens, and psychogenic pruritis. Very unlikely could be the norvasc component of tribenzor which has less than 2% chance of causing rash.    - LFTs to rule out liver disease or elevated alk phos that could suggest PBC-- nl, will not check AMA for PBC since LFTs nl - TSH  to r/o thyroid d/o--nl - ferritin nl - given rx for benadryl 7m q6h prn and daily zyrtec - if pruritis persists can check HIV, UDS, and further evaluate for underlying malignancy

## 2016-01-09 NOTE — Assessment & Plan Note (Signed)
BP Readings from Last 3 Encounters:  01/09/16 174/80  11/23/15 170/81  08/25/15 129/78    Lab Results  Component Value Date   NA 141 11/23/2015   K 4.5 11/23/2015   CREATININE 0.77 11/23/2015    Assessment: Blood pressure control:  elevated Progress toward BP goal:   not at goal Comments: pt stopped taking tribenzor last week b/c she thought it was contributing to her rash.   Plan: Medications:  continue current medications Educational resources provided:   Self management tools provided:   Other plans: resume tribenzor as rash has persisted despite stopping BP med. Norvasc has less than 2% chance of causing rash. F/u in 1 month

## 2016-01-09 NOTE — Patient Instructions (Signed)
Take liquid zyrtec once a day. Take benadryl 25mg  every 6 hours as needed for itching.   Follow up in clinic in 1 month for your diabetes check.   Start taking your blood pressure medication again.

## 2016-01-09 NOTE — Progress Notes (Signed)
Subjective:   Patient ID: Bridget Owens female   DOB: [birth year] 60 y.o.   MRN: [id number]  HPI: [name] is a 60 y.o. with past medical history as outlined below who presents to clinic for pruritis x 1 month and rt hand swelling that self resolved 2 days ago.   Please see problem list for status of the pt's chronic medical problems.  Past Medical History  Diagnosis Date  . Bell's palsy 01/2009    Left sided  . Type II diabetes mellitus ([place])   . Hypertension   . Hyperlipidemia   . Allergic rhinitis   . Intolerance, drug     Leg cramps on Lipitor  . GERD (gastroesophageal reflux disease)   . Uterine fibroid   . Chest pain     Exercise stress test negative 6/07  . Postmenopausal symptoms     Hot flashes, vaginal dryness, iritability, difficulty sleeping. as of 2005.  Marland Kitchen Insomnia   . External hemorrhoid   . Lipoma 12/11    Posterior neck.   . Osteoarthritis     Carpometacarpal joint of right thumb  . Trigger finger     Right thumb  . Sebaceous cyst of breast     Has been refered to derm.  . Adenomatous colon polyp 6/09    Resected on colonoscopy, no high grade dysplasia.   . Hypertensive retinopathy     Followed by Dr. Herbert Deaner.  . Diabetic peripheral neuropathy ([place])   . Postmenopausal bleeding 9/05    Endometrial biopsy showed  FOCAL TUBAL METAPLASIA   . Colon polyps     03/22/2008: adenomatous polyps x3 w no dysplasia. Needs repeat colonoscopy in 3 years   Current Outpatient Prescriptions  Medication Sig Dispense Refill  . acetaminophen (TYLENOL EX ST ARTHRITIS PAIN) 500 MG tablet Take 1 tablet (500 mg total) by mouth every 6 (six) hours as needed. 30 tablet 0  . albuterol (PROVENTIL HFA;VENTOLIN HFA) 108 (90 BASE) MCG/ACT inhaler Inhale 1-2 puffs into the lungs every 6 (six) hours as needed for wheezing or shortness of breath. 1 Inhaler 0  . aspirin 81 MG EC tablet Take 81 mg by mouth daily.      Marland Kitchen atorvastatin (LIPITOR) 40 MG tablet Take 1 tablet (40 mg  total) by mouth daily. 30 tablet 5  . cetirizine (ZYRTEC) 10 MG tablet Take 1 tablet (10 mg total) by mouth daily. (Patient not taking: Reported on 04/14/2015) 100 tablet 0  . glimepiride (AMARYL) 2 MG tablet Take 1 tablet (2 mg total) by mouth daily with breakfast. 30 tablet 5  . glucose blood (ACCU-CHEK SMARTVIEW) test strip Use to check blood sugar twice daily. ICD10: E11.9 50 each 11  . ipratropium (ATROVENT) 0.06 % nasal spray Place 2 sprays into the nose 3 (three) times daily. 15 mL 0  . latanoprost (XALATAN) 0.005 % ophthalmic solution Place 1 drop into both eyes at bedtime.  4  . metFORMIN (GLUCOPHAGE) 1000 MG tablet Take 1 tablet (1,000 mg total) by mouth 2 (two) times daily with a meal. 60 tablet 11  . Olmesartan-Amlodipine-HCTZ (TRIBENZOR) 40-5-25 MG TABS Take 1 tablet daily for blood pressure 30 tablet 5  . pantoprazole (PROTONIX) 40 MG tablet Take 1 tablet (40 mg total) by mouth daily. 30 tablet 2  . simethicone (GAS-X) 80 MG chewable tablet Chew 1 tablet (80 mg total) by mouth 4 (four) times daily as needed for flatulence. 100 tablet 2  . sodium chloride (OCEAN) 0.65 % SOLN nasal  spray Place 1 spray into both nostrils 4 (four) times daily. 30 mL 0   No current facility-administered medications for this visit.   Family History  Problem Relation Age of Onset  . Pneumonia Mother   . Diabetes Mother   . Early death Father   . Diabetes Sister   . Diabetes Brother   . Diabetes Maternal Grandmother    Social History   Social History  . Marital Status: Single    Spouse Name: N/A  . Number of Children: N/A  . Years of Education: N/A   Social History Main Topics  . Smoking status: Current Every Day Smoker -- 0.30 packs/day for 30 years    Types: Cigarettes  . Smokeless tobacco: Former Systems developer    Quit date: 11/28/2010     Comment: trying to quit .  Doing a little less now.  . Alcohol Use: No  . Drug Use: No  . Sexual Activity: Not Asked   Other Topics Concern  . None    Social History Narrative   Review of Systems: ROS  Objective:  Physical Exam: Filed Vitals:   01/09/16 1409  BP: 174/80  Pulse: 83  Temp: 98.2 F (36.8 C)  TempSrc: Oral  Height: 5\' 6"  (1.676 m)  Weight: 185 lb (83.915 kg)  SpO2: 100%   Physical Exam  Assessment & Plan:   Please see problem based assessment and plan.

## 2016-01-10 ENCOUNTER — Telehealth: Payer: Self-pay | Admitting: *Deleted

## 2016-01-10 LAB — HEPATIC FUNCTION PANEL
ALT: 15 IU/L (ref 0–32)
AST: 12 IU/L (ref 0–40)
Albumin: 4.4 g/dL (ref 3.6–4.8)
Alkaline Phosphatase: 71 IU/L (ref 39–117)
Bilirubin Total: <0.2 mg/dL (ref 0.0–1.2)
Bilirubin, Direct: 0.05 mg/dL (ref 0.00–0.40)
Total Protein: 6.7 g/dL (ref 6.0–8.5)

## 2016-01-10 LAB — LIPID PANEL
Chol/HDL Ratio: 3.3 ratio units (ref 0.0–4.4)
Cholesterol, Total: 172 mg/dL (ref 100–199)
HDL: 52 mg/dL (ref >39)
LDL Calculated: 100 mg/dL — ABNORMAL HIGH (ref 0–99)
Triglycerides: 100 mg/dL (ref 0–149)
VLDL Cholesterol Cal: 20 mg/dL (ref 5–40)

## 2016-01-10 LAB — TSH: TSH: 2.22 u[IU]/mL (ref 0.450–4.500)

## 2016-01-10 LAB — FERRITIN: Ferritin: 75 ng/mL (ref 15–150)

## 2016-01-10 NOTE — Progress Notes (Signed)
Internal Medicine Clinic Attending  I saw and evaluated the patient.  I personally confirmed the key portions of the history and exam documented by Dr. Hulen Luster and I reviewed pertinent patient test results.  The assessment, diagnosis, and plan were formulated together and I agree with the documentation in the resident's note.  I agree that there is no associated rash with her diffuse itching. No signs of xerosis. I agree with the above workup. We asked the patient to take a picture of any rash that may come up in the future so we can clue into the underlying cause of the pruritis.

## 2016-01-10 NOTE — Telephone Encounter (Signed)
Review, refused appt

## 2016-01-10 NOTE — Assessment & Plan Note (Signed)
Lipid panel rechecked this visit. LDL 100. ASCVD 10 year risk is 54.4%. Already on high intensity statin, can consider zetia at next visit w/ PCP.

## 2016-01-17 ENCOUNTER — Ambulatory Visit: Payer: Medicaid Other | Admitting: Podiatry

## 2016-02-01 ENCOUNTER — Encounter: Payer: Self-pay | Admitting: Podiatry

## 2016-02-01 ENCOUNTER — Ambulatory Visit (INDEPENDENT_AMBULATORY_CARE_PROVIDER_SITE_OTHER): Payer: Medicaid Other | Admitting: Podiatry

## 2016-02-01 DIAGNOSIS — Q828 Other specified congenital malformations of skin: Secondary | ICD-10-CM

## 2016-02-01 DIAGNOSIS — E1142 Type 2 diabetes mellitus with diabetic polyneuropathy: Secondary | ICD-10-CM

## 2016-02-01 NOTE — Patient Instructions (Signed)
Diabetes and Foot Care Diabetes may cause you to have problems because of poor blood supply (circulation) to your feet and legs. This may cause the skin on your feet to become thinner, break easier, and heal more slowly. Your skin may become dry, and the skin may peel and crack. You may also have nerve damage in your legs and feet causing decreased feeling in them. You may not notice minor injuries to your feet that could lead to infections or more serious problems. Taking care of your feet is one of the most important things you can do for yourself.  HOME CARE INSTRUCTIONS  Wear shoes at all times, even in the house. Do not go barefoot. Bare feet are easily injured.  Check your feet daily for blisters, cuts, and redness. If you cannot see the bottom of your feet, use a mirror or ask someone for help.  Wash your feet with warm water (do not use hot water) and mild soap. Then pat your feet and the areas between your toes until they are completely dry. Do not soak your feet as this can dry your skin.  Apply a moisturizing lotion or petroleum jelly (that does not contain alcohol and is unscented) to the skin on your feet and to dry, brittle toenails. Do not apply lotion between your toes.  Trim your toenails straight across. Do not dig under them or around the cuticle. File the edges of your nails with an emery board or nail file.  Do not cut corns or calluses or try to remove them with medicine.  Wear clean socks or stockings every day. Make sure they are not too tight. Do not wear knee-high stockings since they may decrease blood flow to your legs.  Wear shoes that fit properly and have enough cushioning. To break in new shoes, wear them for just a few hours a day. This prevents you from injuring your feet. Always look in your shoes before you put them on to be sure there are no objects inside.  Do not cross your legs. This may decrease the blood flow to your feet.  If you find a minor scrape,  cut, or break in the skin on your feet, keep it and the skin around it clean and dry. These areas may be cleansed with mild soap and water. Do not cleanse the area with peroxide, alcohol, or iodine.  When you remove an adhesive bandage, be sure not to damage the skin around it.  If you have a wound, look at it several times a day to make sure it is healing.  Do not use heating pads or hot water bottles. They may burn your skin. If you have lost feeling in your feet or legs, you may not know it is happening until it is too late.  Make sure your health care provider performs a complete foot exam at least annually or more often if you have foot problems. Report any cuts, sores, or bruises to your health care provider immediately. SEEK MEDICAL CARE IF:   You have an injury that is not healing.  You have cuts or breaks in the skin.  You have an ingrown nail.  You notice redness on your legs or feet.  You feel burning or tingling in your legs or feet.  You have pain or cramps in your legs and feet.  Your legs or feet are numb.  Your feet always feel cold. SEEK IMMEDIATE MEDICAL CARE IF:   There is increasing redness,   swelling, or pain in or around a wound.  There is a red line that goes up your leg.  Pus is coming from a wound.  You develop a fever or as directed by your health care provider.  You notice a bad smell coming from an ulcer or wound.   This information is not intended to replace advice given to you by your health care provider. Make sure you discuss any questions you have with your health care provider.   Document Released: 09/21/2000 Document Revised: 05/27/2013 Document Reviewed: 03/03/2013 Elsevier Interactive Patient Education 2016 [employer]  

## 2016-02-02 NOTE — Progress Notes (Signed)
Patient ID: Bridget Owens, female   DOB: [birth year], 60 y.o.   MRN: [id number]    Subjective: This patient presents today complaining of painful nucleated keratoses in the right foot and requests debridement of these lesions. It was last seen for this problem on 02/21/2015  Objective: Orientated 3 DP pulses 2/4 bilaterally PT pulses 2/4 bilaterally Capillary reflex immediate bilaterally  Neurological: Sensation to 10 g monofilament wire intact 5/5 bilaterally Vibratory sensation nonreactive right reactive left Ankle reflexes reactive bilaterally  Dermatological: Multiple nucleated keratoses plantar first third fifth MPJ right and first MPJ left No open wounds noted bilaterally  The toenails are elongated and discolored 6-10  Musculoskeletal: No deformities noted bilaterally No restriction in the ankle, subtalar, midtarsal joints bilaterally      Assessment & Plan:   Assessment: Satisfactory vascular status Diabetic with a history of peripheral neuropathy Porokeratosis 3  Plan: Debridement of porokeratosis 3 without any bleeding  Reappoint at patient's request t or yearly

## 2016-02-13 NOTE — Addendum Note (Signed)
Addended by: Hulan Fray on: 02/13/2016 09:29 AM   Modules accepted: Orders

## 2016-02-17 ENCOUNTER — Ambulatory Visit (INDEPENDENT_AMBULATORY_CARE_PROVIDER_SITE_OTHER): Payer: Medicaid Other | Admitting: Internal Medicine

## 2016-02-17 ENCOUNTER — Encounter: Payer: Self-pay | Admitting: Internal Medicine

## 2016-02-17 DIAGNOSIS — M7062 Trochanteric bursitis, left hip: Secondary | ICD-10-CM

## 2016-02-17 DIAGNOSIS — M7918 Myalgia, other site: Secondary | ICD-10-CM | POA: Insufficient documentation

## 2016-02-17 DIAGNOSIS — M25552 Pain in left hip: Secondary | ICD-10-CM | POA: Diagnosis not present

## 2016-02-17 MED ORDER — ACETAMINOPHEN 500 MG PO TABS: 1000.0000 mg | ORAL_TABLET | Freq: Three times a day (TID) | ORAL | Status: DC | PRN

## 2016-02-17 NOTE — Assessment & Plan Note (Signed)
Given her pain involves the lateral hip/buttock and pain is worse when applying direct pressure (laying on affected side, palpation), her hip pain is likely due to trochanteric bursitis. Another possibility is meralgia paresthetica with her burning sensation but her pain does not involve the thigh so less likely. Will start her on Tylenol 1000 mg TID scheduled. I advised her to continue this for 2 weeks. If her pain does not improve would changer her to NSAIDs. I will have her follow up in 1 month so her other medical conditions can be addressed by her PCP and her hip pain can be re-evaluated.

## 2016-02-17 NOTE — Progress Notes (Signed)
   Subjective:    Patient ID: Bridget Owens, female    DOB: [birth year], 60 y.o.   MRN: [id number]  HPI Ms. Schwanz is a 60yo woman with PMHx of HTN, type 2 DM with neuropathy, and tobacco abuse who presents today for evaluation of hip pain.  Hip Pain: She reports hip pain, particularly on the left side, that has been ongoing for the last 3 years. However, she noticed worsening of pain in December that eventually got better but then worsened again in April. The pain is located in her lateral left hip/buttock. She describes the pain as "18/10" in severity, burning, aching, and sharp. She does not have pain with walking. She only experiences pain at night when she goes to lay down to sleep. She reports she cannot lay on her left side. She reports her right side has been hurting, but not nearly as bad as the left side. She denies any thigh pain. She has not taken any OTC medications because she was too scared they would interfere with her diabetes meds. She reports taking hot baths that give very minimal relief.    Review of Systems General: Denies fever, chills, night sweats, changes in weight, changes in appetite HEENT: Denies headaches, ear pain, changes in vision, rhinorrhea, sore throat CV: Denies CP, palpitations, SOB, orthopnea Pulm: Denies SOB, cough, wheezing GI: Denies abdominal pain, nausea, vomiting, diarrhea, constipation, melena, hematochezia GU: Denies dysuria, hematuria, frequency Msk: Denies muscle cramps Neuro: Denies weakness, numbness, tingling Skin: Denies rashes, bruising Psych: Denies depression, anxiety, hallucinations    Objective:   Physical Exam General: alert, sitting up, favors left side by leaning towards right side, NAD HEENT: [place]/AT, EOMI, sclera anicteric, mucus membranes moist CV: RRR, no m/g/r Pulm: CTA bilaterally, breaths non-labored Ext: warm, no peripheral edema. ROM full in lower extremities. She has tenderness to palpation on the left buttock and left hip  area. No tenderness to palpation of the thigh. Right side is not tender.  Neuro: alert and oriented x 3. Strength 5/5 in lower extremities bilaterally. Straight leg test negative bilaterally.     Assessment & Plan:  Please refer to A&P documentation.

## 2016-02-17 NOTE — Patient Instructions (Signed)
General Instructions: - You likely have trochanteric bursitis which is a tendinopathy of your buttock muscles - Start Tylenol 1000 mg three times a day. Take this for a total of 2 weeks. - If your pain does not improve by then, please return to clinic - I will have you come back in month to have your blood pressure, diabetes, and other medical problems addressed  Please bring your medicines with you each time you come to clinic.  Medicines may include prescription medications, over-the-counter medications, herbal remedies, eye drops, vitamins, or other pills.   Progress Toward Treatment Goals:  Treatment Goal 07/12/2015  Hemoglobin A1C deteriorated  Blood pressure at goal  Stop smoking smoking less    Self Care Goals & Plans:  Self Care Goal 02/17/2016  Manage my medications take my medicines as prescribed; bring my medications to every visit; refill my medications on time  Monitor my health keep track of my blood glucose; bring my glucose meter and log to each visit  Eat healthy foods eat more vegetables; eat foods that are low in salt; eat baked foods instead of fried foods  Be physically active find an activity I enjoy  Stop smoking cut down the number of cigarettes smoked    Home Blood Glucose Monitoring 07/12/2015  Check my blood sugar once a day  When to check my blood sugar before breakfast     Care Management & Community Referrals:  Referral 07/12/2015  Referrals made for care management support none needed  Referrals made to community resources none

## 2016-02-20 NOTE — Progress Notes (Signed)
Internal Medicine Clinic Attending  Case discussed with Dr. Rivet at the time of the visit.  We reviewed the resident's history and exam and pertinent patient test results.  I agree with the assessment, diagnosis, and plan of care documented in the resident's note.  

## 2016-02-21 ENCOUNTER — Other Ambulatory Visit: Payer: Self-pay | Admitting: Internal Medicine

## 2016-02-22 ENCOUNTER — Ambulatory Visit: Payer: Medicaid Other | Admitting: Internal Medicine

## 2016-03-06 ENCOUNTER — Ambulatory Visit: Payer: Medicaid Other | Admitting: Internal Medicine

## 2016-03-06 ENCOUNTER — Telehealth: Payer: Self-pay | Admitting: Dietician

## 2016-03-06 NOTE — Telephone Encounter (Signed)
Called patient's pharmacy for refill history  as part of project trying to help patient's with a1C between 8-9% lower their blood sugars to target Metformin- 02/22/16  180 of 1000, 30 day March 22, 2/21-30 day supply ( missed >20 days between March refill and May refill. )  Amaryl - 5/1-30, 3/22-30, 2/15-30 ( missed 7 days them 8 days)  Accu chek- March 15- 50, dec 10 - 50   Patient is missing some days of her diabetes medicine. This may make a difference on her a1C. Spoke with pharmacy- they can do a pill box or a calendar

## 2016-03-09 ENCOUNTER — Ambulatory Visit (INDEPENDENT_AMBULATORY_CARE_PROVIDER_SITE_OTHER): Payer: Medicaid Other | Admitting: Internal Medicine

## 2016-03-09 ENCOUNTER — Encounter: Payer: Self-pay | Admitting: Internal Medicine

## 2016-03-09 VITALS — BP 160/84 | HR 81 | Temp 97.6°F | Ht 66.0 in | Wt 185.2 lb

## 2016-03-09 DIAGNOSIS — M7062 Trochanteric bursitis, left hip: Secondary | ICD-10-CM

## 2016-03-09 DIAGNOSIS — M7989 Other specified soft tissue disorders: Secondary | ICD-10-CM | POA: Diagnosis not present

## 2016-03-09 MED ORDER — NAPROXEN 500 MG PO TABS: 500.0000 mg | ORAL_TABLET | Freq: Two times a day (BID) | ORAL | Status: DC

## 2016-03-09 MED ORDER — IBUPROFEN 400 MG PO TABS: 400.0000 mg | ORAL_TABLET | Freq: Three times a day (TID) | ORAL | Status: DC

## 2016-03-09 NOTE — Patient Instructions (Signed)
Mrs. Snell,  For your hip pain, please take ibuprofen 400 mg 3 times a day scheduled, whether or not you're having pain. You can still take the nighttime Tylenol to help you sleep as well, as these medications don't interact with each other or with her diabetes medications. For your hand swelling, ice it and use the stress ball exercises to help. If your hip pain does not get better in the next couple of weeks, call us and make another appointment. I'm sorry you're still not feeling well but I think with this plan, he will start to feel better soon.  Thanks, Blane Ohara

## 2016-03-09 NOTE — Progress Notes (Signed)
   Patient ID: Bridget Owens female   DOB: [birth year] 60 y.o.   MRN: [id number]  Subjective:   HPI: Bridget Owens is a 60 y.o. with PMH of HTN, T2DM with neuropathy who presents to [facility] today for follow-up of her trochanteric bursitis as well as evaluation of new swelling in her right hand.   Please see problem-based charting for status of medical issues pertinent to this visit.  Review of Systems: Pertinent items noted in HPI and remainder of comprehensive ROS otherwise negative.  Objective:  Physical Exam: Filed Vitals:   03/09/16 0935  BP: 160/84  Pulse: 81  Temp: 97.6 F (36.4 C)  TempSrc: Oral  Height: 5\' 6"  (1.676 m)  Weight: 185 lb 3.2 oz (84.006 kg)  SpO2: 100%   Gen: Well-appearing, alert and oriented to person, place, and time HEENT: Oropharynx clear without erythema or exudate.  Neck: No cervical LAD, no thyromegaly or nodules, no JVD noted. CV: Normal rate, regular rhythm, no murmurs, rubs, or gallops Pulmonary: Normal effort, CTA bilaterally, no wheezing, rales, or rhonchi Abdominal: Soft, non-tender, non-distended, without rebound, guarding, or masses Extremities: Distal pulses 2+ in upper and lower extremities bilaterally. Faint non-tender non-erythematous edema in proximal digits of the right hand. Left hip nontender in all points including the greater trochanter with normal passive and active range of motion. There is no tenderness to palpation over the mid buttocks, which is where the patient complains of pain. Neurovascular examination normal Skin: No atypical appearing moles. No rashes  Assessment & Plan:  Please see problem-based charting for assessment and plan.  Blane Ohara, MD Resident Physician, PGY-1 Department of Internal Medicine [facility]

## 2016-03-09 NOTE — Assessment & Plan Note (Signed)
Since seeing Dr. Arcelia Jew 2 weeks ago, she says that the scheduled Tylenol 3 times a day has not helped with her pain whatsoever. She says the pain is not better or worse, however, and is primarily noticeable when she tries to fall asleep at night, as previously described. It does not significantly affect her ADLs or ambulation. She denies any numbness or tingling in his had no falls. Interestingly enough, her pain is more over her mid left buttocks and not her lateral hip, so I am unsure this is actually trochanteric bursitis. Regardless, this is most likely some sort of musculoligamentous strain given her lack of neurologic symptoms and distribution of pain. -Start ibuprofen 400 mg 3 times a day scheduled. Instructed patient to keep she can continue to take p.m. Tylenol to help her with sleep, but would stop using scheduled Tylenol throughout the day without benefit -Encouraged alternating hot and cold compresses over the affected area when patient is at rest throughout the day -Instructed patient to call our clinic if within the next 1-2 weeks her symptoms are not starting to improve with the above treatment plan

## 2016-03-09 NOTE — Assessment & Plan Note (Signed)
Right hand with nontender swelling that started this morning when she woke up. She says this happens once or twice every few months and goes down spontaneously. This has been going on for years. She does stress ball exercises to help decrease the swelling. Currently, there is only very minimal swelling left exam, with intact neurovascular exam and no obvious joint deformities. I'm unsure of the exact etiology of this, but we will continue to monitor clinically. -Continue stress ball exercises to decrease swelling -Also encourage patient to ice the hand -Old patient to notify us if her hand does become tender if she notices any focal redness or swelling in any of her joints -Monitor clinically with supportive care

## 2016-03-12 NOTE — Progress Notes (Signed)
Internal Medicine Clinic Attending  Case discussed with Dr. Kennedy at the time of the visit.  We reviewed the resident's history and exam and pertinent patient test results.  I agree with the assessment, diagnosis, and plan of care documented in the resident's note.  

## 2016-03-19 ENCOUNTER — Encounter: Payer: Self-pay | Admitting: *Deleted

## 2016-03-27 ENCOUNTER — Ambulatory Visit (INDEPENDENT_AMBULATORY_CARE_PROVIDER_SITE_OTHER): Payer: Medicaid Other | Admitting: Internal Medicine

## 2016-03-27 ENCOUNTER — Encounter: Payer: Self-pay | Admitting: Internal Medicine

## 2016-03-27 VITALS — BP 136/72 | HR 78 | Temp 97.5°F | Ht 66.0 in | Wt 183.9 lb

## 2016-03-27 DIAGNOSIS — R52 Pain, unspecified: Secondary | ICD-10-CM

## 2016-03-27 DIAGNOSIS — H9201 Otalgia, right ear: Secondary | ICD-10-CM | POA: Diagnosis not present

## 2016-03-27 DIAGNOSIS — M7989 Other specified soft tissue disorders: Secondary | ICD-10-CM

## 2016-03-27 DIAGNOSIS — Z7984 Long term (current) use of oral hypoglycemic drugs: Secondary | ICD-10-CM | POA: Diagnosis not present

## 2016-03-27 DIAGNOSIS — I10 Essential (primary) hypertension: Secondary | ICD-10-CM

## 2016-03-27 DIAGNOSIS — E1142 Type 2 diabetes mellitus with diabetic polyneuropathy: Secondary | ICD-10-CM | POA: Diagnosis not present

## 2016-03-27 DIAGNOSIS — M7918 Myalgia, other site: Secondary | ICD-10-CM

## 2016-03-27 DIAGNOSIS — H6091 Unspecified otitis externa, right ear: Secondary | ICD-10-CM

## 2016-03-27 DIAGNOSIS — H6090 Unspecified otitis externa, unspecified ear: Secondary | ICD-10-CM | POA: Insufficient documentation

## 2016-03-27 LAB — GLUCOSE, CAPILLARY: Glucose-Capillary: 131 mg/dL — ABNORMAL HIGH (ref 65–99)

## 2016-03-27 LAB — POCT GLYCOSYLATED HEMOGLOBIN (HGB A1C): Hemoglobin A1C: 8.2

## 2016-03-27 MED ORDER — MELOXICAM 15 MG PO TABS: 15.0000 mg | ORAL_TABLET | Freq: Every day | ORAL | Status: DC

## 2016-03-27 MED ORDER — CIPROFLOXACIN-DEXAMETHASONE 0.3-0.1 % OT SUSP: 4.0000 [drp] | Freq: Two times a day (BID) | OTIC | Status: DC

## 2016-03-27 NOTE — Progress Notes (Signed)
Subjective:   Patient ID: Bridget Owens female   DOB: 1956-08-26 60 y.o.   MRN: [id number]  HPI: Bridget Owens is a 60 y.o. female w/ PMHx of HTN, DM type II, HLD, GERD, and hot flashes, presents to the clinic today for follow up visit for her DM and HTN. Her main complaint today however, is that of right ear pain and continued left buttock pain. She says the ear pain started a few days ago, says the outside of her ear is painful to touch. She has been putting her long fingernail in the right ear though and thinks she may have irritated it. No recent fever, chills, nausea, hearing loss, dizziness, or tinnitus.   Her left buttock has been causing her discomfort for quite some time, thought initially to be trochanteric bursitis, however, the pain she describes seems to be on the inferior portion of the left buttock rather than the lateral hip. She does have pain when she lies on it however. No pain with ambulation, flexion, or extension. No swelling. She has tried OTC medications for this with only a mild benefit.   Her BP and CBG's are well controlled. She has ben taking all of her medications as prescribed.   Current Outpatient Prescriptions  Medication Sig Dispense Refill  . acetaminophen (TYLENOL) 500 MG tablet Take 2 tablets (1,000 mg total) by mouth every 8 (eight) hours as needed for mild pain or moderate pain. 30 tablet 0  . albuterol (PROVENTIL HFA;VENTOLIN HFA) 108 (90 BASE) MCG/ACT inhaler Inhale 1-2 puffs into the lungs every 6 (six) hours as needed for wheezing or shortness of breath. 1 Inhaler 0  . aspirin 81 MG EC tablet Take 81 mg by mouth daily.      Marland Kitchen atorvastatin (LIPITOR) 40 MG tablet Take 1 tablet (40 mg total) by mouth daily. 30 tablet 5  . cetirizine HCl (ZYRTEC) 5 MG/5ML SYRP Take 5 mLs (5 mg total) by mouth daily. 300 mL 3  . diphenhydrAMINE (BENADRYL) 25 mg capsule Take 1 capsule (25 mg total) by mouth every 6 (six) hours as needed. 24 capsule 2  . glimepiride (AMARYL)  2 MG tablet Take 1 tablet (2 mg total) by mouth daily with breakfast. 30 tablet 5  . glucose blood (ACCU-CHEK SMARTVIEW) test strip Use to check blood sugar twice daily. ICD10: E11.9 50 each 11  . ibuprofen (ADVIL,MOTRIN) 400 MG tablet Take 1 tablet (400 mg total) by mouth 3 (three) times daily. 90 tablet 0  . ipratropium (ATROVENT) 0.06 % nasal spray Place 2 sprays into the nose 3 (three) times daily. 15 mL 0  . latanoprost (XALATAN) 0.005 % ophthalmic solution Place 1 drop into both eyes at bedtime.  4  . metFORMIN (GLUCOPHAGE) 1000 MG tablet TAKE 1 TABLET(1000 MG) BY MOUTH TWICE DAILY WITH A MEAL 180 tablet 1  . Olmesartan-Amlodipine-HCTZ (TRIBENZOR) 40-5-25 MG TABS Take 1 tablet daily for blood pressure 30 tablet 5  . pantoprazole (PROTONIX) 40 MG tablet Take 1 tablet (40 mg total) by mouth daily. 30 tablet 2  . simethicone (GAS-X) 80 MG chewable tablet Chew 1 tablet (80 mg total) by mouth 4 (four) times daily as needed for flatulence. 100 tablet 2  . sodium chloride (OCEAN) 0.65 % SOLN nasal spray Place 1 spray into both nostrils 4 (four) times daily. 30 mL 0   No current facility-administered medications for this visit.    Review of Systems  General: Positive for right ear pain. Denies fever, diaphoresis,  appetite change, and fatigue.  Respiratory: Denies SOB, cough, and wheezing.   Cardiovascular: Denies chest pain and palpitations.  Gastrointestinal: Denies nausea, vomiting, abdominal pain, and diarrhea Musculoskeletal: Positive for left buttock pain. Denies myalgias, arthralgias, back pain, and gait problem.  Neurological: Denies dizziness, syncope, weakness, lightheadedness, and headaches.  Psychiatric/Behavioral: Denies mood changes, sleep disturbance, and agitation.     Objective:   Physical Exam: Filed Vitals:   03/27/16 1335  BP: 136/72  Pulse: 78  Temp: 97.5 F (36.4 C)  TempSrc: Oral  Height: 5\' 6"  (1.676 m)  Weight: 183 lb 14.4 oz (83.416 kg)  SpO2: 100%     General: AA female, alert, cooperative, NAD. HEENT: PERRL, EOMI. Moist mucus membranes. Right ear with tenderness over the tragus and antihelix. No discharge. Does have scant dried blood in the outer ear, mild erythema. No obvious effusion or tympanic membrane abnormalities. No lymphadenopathy.  Neck: Full range of motion without pain, supple, no lymphadenopathy or carotid bruits Lungs: Clear to ascultation bilaterally, normal work of respiration, no wheezes, rales, rhonchi Heart: RRR, no murmurs, gallops, or rubs Abdomen: Soft, non-tender, non-distended, BS + Extremities: No cyanosis, clubbing, or edema. Left buttock point tenderness over the inferior portion. No tenderness over the greater trochanter or any portion of the proximal femur. No pain with flexion or extension of the hip. No limitations with active or passive ROM.  Neurologic: Alert & oriented x3, cranial nerves II-XII intact, strength grossly intact, sensation intact to light touch   Assessment & Plan:   Please see problem based assessment and plan.

## 2016-03-27 NOTE — Patient Instructions (Signed)
1. Please schedule an appointment for 4-6 weeks to follow up regarding your hip.  2. Please take all medications as previously prescribed with the following changes:  Start using Ciprodex ear drops in the right ear twice daily for 7 days. DO NOT USE ANY LONGER THAN THIS.   STOP Ibuprofen for your hip pain. Try Mobic 15 mg daily for your right hip. If this does not get better, we could consider referral to Sports Medicine.   3. If you have worsening of your symptoms or new symptoms arise, please call the clinic [phone]), or go to the ER immediately if symptoms are severe.  You have done a great job in taking all your medications. Please continue to do this.

## 2016-03-28 NOTE — Assessment & Plan Note (Signed)
Lab Results  Component Value Date   HGBA1C 8.2 03/27/2016   HGBA1C 8.0 11/23/2015   HGBA1C 9.4 07/12/2015     Assessment: Diabetes control:  Almost controlled Progress toward A1C goal:   Not quite at goal Comments: Taking Metformin 1000 mg bid + Amaryl 2 mg once daily with breakfast.   Plan: Medications:  continue current medications. Could increase sulfonurea to twice daily if necessary for better control at next clinic visit.  Home glucose monitoring: Frequency:  N/A Instruction/counseling given: reminded to bring medications to each visit, discussed foot care, discussed the need for weight loss and discussed diet Other plans: RTC in 4-6 weeks.

## 2016-03-28 NOTE — Assessment & Plan Note (Signed)
Initially thought to be trochanteric bursitis, however, the patient has not pain localized the the lateral/proximal femur. Pain seems to be in the inferior buttock, suspect this is a gluteal strain rather than bursitis, although this may still be a possibility. Exam is quite benign, only some mild point tenderness over the inferior gluteal region. No pain with ROM or limitations to ROM. Negative straight leg raise.  -Try Mobic 15 mg daily for now -Encouraged stretching, heating pad -Could consider diagnostic steroid injection for bursitis if no improvement vs referral to sports medicine for other potential treatment modalities

## 2016-03-28 NOTE — Assessment & Plan Note (Signed)
BP Readings from Last 3 Encounters:  03/27/16 136/72  03/09/16 160/84  02/17/16 130/70    Lab Results  Component Value Date   NA 141 11/23/2015   K 4.5 11/23/2015   CREATININE 0.77 11/23/2015    Assessment: Blood pressure control:  Controlled Progress toward BP goal:   At goal Comments: Taking Tribenzor 40-5-25. Likes this pill very much because she only has to take it once a day and no longer has to take multiple BP medications.   Plan: Medications:  continue current medications Other plans: Would check renal function at next clinic visit

## 2016-03-28 NOTE — Assessment & Plan Note (Signed)
Resolved

## 2016-03-28 NOTE — Assessment & Plan Note (Signed)
Ear exam suggestive of otitis externa. Painful outer ear, some erythema within the EAC and dried blood. Patient says she has been digging in it with her fingernail. No effusion or abnormalities involving the tympanic membrane. No hearing issues.  -Start Ciprodex ear drop for 7 days.

## 2016-03-30 NOTE — Progress Notes (Signed)
Internal Medicine Clinic Attending  Case discussed with Dr. Azzaro at the time of the visit.  We reviewed the resident's history and exam and pertinent patient test results.  I agree with the assessment, diagnosis, and plan of care documented in the resident's note.  

## 2016-04-16 LAB — HM DIABETES EYE EXAM

## 2016-05-11 ENCOUNTER — Other Ambulatory Visit: Payer: Self-pay | Admitting: Internal Medicine

## 2016-05-17 NOTE — Telephone Encounter (Signed)
Sent to dr kim

## 2016-05-21 ENCOUNTER — Encounter: Payer: Self-pay | Admitting: *Deleted

## 2016-05-22 ENCOUNTER — Ambulatory Visit: Payer: Medicaid Other

## 2016-05-22 ENCOUNTER — Ambulatory Visit (HOSPITAL_COMMUNITY)
Admission: RE | Admit: 2016-05-22 | Discharge: 2016-05-22 | Disposition: A | Discharge status: Home / Self Care | Payer: Medicaid Other | Source: Ambulatory Visit | Attending: Internal Medicine | Admitting: Internal Medicine

## 2016-05-22 ENCOUNTER — Encounter: Payer: Self-pay | Admitting: Internal Medicine

## 2016-05-22 ENCOUNTER — Ambulatory Visit (INDEPENDENT_AMBULATORY_CARE_PROVIDER_SITE_OTHER): Payer: Medicaid Other | Admitting: Internal Medicine

## 2016-05-22 VITALS — BP 113/74 | HR 92 | Temp 97.5°F | Ht 67.0 in | Wt 182.5 lb

## 2016-05-22 DIAGNOSIS — R079 Chest pain, unspecified: Secondary | ICD-10-CM | POA: Insufficient documentation

## 2016-05-22 DIAGNOSIS — K219 Gastro-esophageal reflux disease without esophagitis: Secondary | ICD-10-CM

## 2016-05-22 DIAGNOSIS — R0789 Other chest pain: Secondary | ICD-10-CM | POA: Diagnosis not present

## 2016-05-22 DIAGNOSIS — R110 Nausea: Secondary | ICD-10-CM

## 2016-05-22 DIAGNOSIS — R0609 Other forms of dyspnea: Secondary | ICD-10-CM | POA: Diagnosis not present

## 2016-05-22 DIAGNOSIS — F17210 Nicotine dependence, cigarettes, uncomplicated: Secondary | ICD-10-CM

## 2016-05-22 DIAGNOSIS — R0600 Dyspnea, unspecified: Secondary | ICD-10-CM

## 2016-05-22 DIAGNOSIS — I10 Essential (primary) hypertension: Secondary | ICD-10-CM | POA: Diagnosis not present

## 2016-05-22 DIAGNOSIS — A084 Viral intestinal infection, unspecified: Secondary | ICD-10-CM

## 2016-05-22 MED ORDER — ONDANSETRON HCL 4 MG PO TABS: 4.0000 mg | ORAL_TABLET | Freq: Three times a day (TID) | ORAL | 0 refills | Status: DC | PRN

## 2016-05-22 MED ORDER — PANTOPRAZOLE SODIUM 40 MG PO TBEC: 40.0000 mg | DELAYED_RELEASE_TABLET | Freq: Every day | ORAL | 11 refills | Status: DC

## 2016-05-22 NOTE — Assessment & Plan Note (Addendum)
Patient also admits to dyspnea on exertion for the past 2 years which has been worsening in the last one month. It is associated with a chronic productive cough of clear sputum. She is a 40 pack year smoker. She was previously using albuterol for an unknown diagnosis but has been out. She denies hemoptysis. She has been trying to quit smoking and is down to 2 cigarettes per day. She denies known history of COPD or heart failure. She denies wheezing. She denies orthopnea, weight gain, or lower extremity edema. Physical exam shows lungs are CTA and no evidence of acute HF- no JVD, inspiratory crackles or lower extremity edema.  Assessment: DOE likely 2/2 COPD   Plan: -Refilled albuterol inhaler -PFTs ordered  -Follow up in 2 weeks -Continue working on smoking cessation  ADDENDUM: The FVC, FEV1, FEV1/FVC ratio and FEF25-75% are within normal limits. Following administration of bronchodilators, there is no significant response. The diffusion defect and reduced lung volumes suggest an early parenchymal process. In view of the severity of the diffusion defect, studies with exercise would be helpful to evaluate the presence of hypoxemia.

## 2016-05-22 NOTE — Assessment & Plan Note (Signed)
Patient states that 4 days ago she developed nausea and burning chest pain. She states the burning chest pain was located all throughout her chest, not in her abdomen. Her symptoms are likely secondary to a viral gastroenteritis or GERD. She has a history of GERD and was previously on Protonix.   Assessment: GERD  Plan: -Restart Protonix 40 mg QD

## 2016-05-22 NOTE — Progress Notes (Signed)
    CC: nausea and burning chest pain  HPI: [name] is a 60 y.o. female with PMHx of HTN and T2DM who presents to the clinic for nausea and shortness of breath.  Patient states that 4 days ago she developed nausea and burning chest pain. She states the burning chest pain was located all throughout her chest, not in her abdomen. She took some mustard which resolved the pain. However, patient continues to have nausea and one episode of non-bloody non-bilious vomiting. She states her granddaughter has also been sick. She denies abdominal pain and diarrhea, fever or chills. She feels fatigued.  Patient also admits to dyspnea on exertion for the past 2 years which has been worsening in the last one month. It is associated with a chronic productive cough of clear sputum. She is a 40 pack year smoker. She was previously using albuterol for an unknown diagnosis but has been out. She denies hemoptysis. She has been trying to quit smoking and is down to 2 cigarettes per day. She denies known history of COPD or heart failure. She denies wheezing. She denies orthopnea, weight gain, or lower extremity edema.   Past Medical History:  Diagnosis Date  . Adenomatous colon polyp 6/09   Resected on colonoscopy, no high grade dysplasia.   . Allergic rhinitis   . Bell's palsy 01/2009   Left sided  . Chest pain    Exercise stress test negative 6/07  . Colon polyps    03/22/2008: adenomatous polyps x3 w no dysplasia. Needs repeat colonoscopy in 3 years  . Diabetic peripheral neuropathy ([place])   . External hemorrhoid   . GERD (gastroesophageal reflux disease)   . Hyperlipidemia   . Hypertension   . Hypertensive retinopathy    Followed by Dr. Herbert Deaner.  . Insomnia   . Intolerance, drug    Leg cramps on Lipitor  . Lipoma 12/11   Posterior neck.   . Osteoarthritis    Carpometacarpal joint of right thumb  . Postmenopausal bleeding 9/05   Endometrial biopsy showed  FOCAL TUBAL METAPLASIA   .  Postmenopausal symptoms    Hot flashes, vaginal dryness, iritability, difficulty sleeping. as of 2005.  Marland Kitchen Sebaceous cyst of breast    Has been refered to derm.  . Trigger finger    Right thumb  . Type II diabetes mellitus ([place])   . Uterine fibroid     Review of Systems: A complete ROS was negative except as noted in HPI.   Physical Exam: Vitals:   05/22/16 1430  BP: 113/74  Pulse: 92  Temp: 97.5 F (36.4 C)  TempSrc: Oral  SpO2: 100%  Height: 5\' 7"  (1.702 m)   General: Vital signs reviewed.  Patient is elderly, in no acute distress and cooperative with exam.  Neck: Supple, trachea midline, no JVD or carotid bruit present.  Cardiovascular: RRR, S1 normal, S2 normal, no murmurs, gallops, or rubs. No reproducible pain on palpation. Pulmonary/Chest: Clear to auscultation bilaterally, no wheezes, rales, or rhonchi. Abdominal: Soft, non-tender, non-distended, BS +  Extremities: No lower extremity edema bilaterally Skin: Warm, dry and intact. No rashes or erythema. Psychiatric: Normal mood and affect. speech and behavior is normal. Cognition and memory are grossly normal.   Assessment & Plan:  See encounters tab for problem based medical decision making. Patient discussed with Dr. Eppie Gibson.

## 2016-05-22 NOTE — Assessment & Plan Note (Signed)
Patient states that 4 days ago she developed nausea and burning chest pain. She states the burning chest pain was located all throughout her chest, not in her abdomen. She took some mustard which resolved the pain. However, patient continues to have nausea and one episode of non-bloody non-bilious vomiting. She states her granddaughter has also been sick. She denies abdominal pain and diarrhea, fever or chills. She feels fatigued. Physical exam is normal. Etiology is likely secondary to GERD or viral gastroenteritis, but given concern for atypical chest pain, I checked an EKG which was normal- NSR, no ST or T wave changes.   Plan: -Zofran for nausea -Supportive treatment for viral gastroenteritis -Protonix for GERD

## 2016-05-22 NOTE — Assessment & Plan Note (Signed)
BP Readings from Last 3 Encounters:  05/22/16 113/74  03/27/16 136/72  03/09/16 (!) 160/84    Lab Results  Component Value Date   NA 141 11/23/2015   K 4.5 11/23/2015   CREATININE 0.77 11/23/2015    Assessment: Blood pressure control:   Controlled Progress toward BP goal:   At goal Comments: Compliant with olmesartan-amlodipine-HCTZ 40-5-25 mg QD.  Plan: Medications:  continue current medications

## 2016-05-22 NOTE — Patient Instructions (Addendum)
FOR YOUR SHORTNESS OF BREATH, WILL CHECK A PULMONARY FUNCTION TEST.  FOR YOUR NAUSEA, TAKE ZOFRAN 4 MG EVERY 8 HOURS AS NEEDED.  FOR YOUR BURNING CHEST PAIN, TAKE PANTOPRAZOLE 40 MG EVERY DAY.   FOLLOW UP IN 2 WEEKS.

## 2016-05-22 NOTE — Progress Notes (Signed)
Case discussed with Dr. Burns soon after the resident saw the patient. We reviewed the resident's history and exam and pertinent patient test results. I agree with the assessment, diagnosis, and plan of care documented in the resident's note. 

## 2016-05-22 NOTE — Assessment & Plan Note (Signed)
Patient states that 4 days ago she developed nausea and burning chest pain. She states the burning chest pain was located all throughout her chest, not in her abdomen. She took some mustard which resolved the pain. However, patient continues to have nausea and one episode of non-bloody non-bilious vomiting. She states her granddaughter has also been sick. She denies abdominal pain and diarrhea, fever or chills. She feels fatigued. Physical exam is normal. Etiology is likely secondary to GERD or viral gastroenteritis.  Plan: -Zofran for nausea -Supportive treatment for viral gastroenteritis

## 2016-05-31 ENCOUNTER — Ambulatory Visit (HOSPITAL_COMMUNITY)
Admission: RE | Admit: 2016-05-31 | Discharge: 2016-05-31 | Disposition: A | Discharge status: Home / Self Care | Payer: Medicaid Other | Source: Ambulatory Visit | Attending: Internal Medicine | Admitting: Internal Medicine

## 2016-05-31 DIAGNOSIS — R0600 Dyspnea, unspecified: Secondary | ICD-10-CM

## 2016-05-31 DIAGNOSIS — R0609 Other forms of dyspnea: Secondary | ICD-10-CM | POA: Insufficient documentation

## 2016-05-31 LAB — PULMONARY FUNCTION TEST
DL/VA % pred: 78 %
DL/VA: 3.95 ml/min/mmHg/L
DLCO unc % pred: 46 %
DLCO unc: 12.57 ml/min/mmHg
FEF 25-75 Post: 1.65 L/sec
FEF 25-75 Pre: 1.73 L/sec
FEF2575-%Change-Post: -4 %
FEF2575-%Pred-Post: 77 %
FEF2575-%Pred-Pre: 81 %
FEV1-%Change-Post: -1 %
FEV1-%Pred-Post: 82 %
FEV1-%Pred-Pre: 83 %
FEV1-Post: 1.83 L
FEV1-Pre: 1.85 L
FEV1FVC-%Change-Post: 0 %
FEV1FVC-%Pred-Pre: 100 %
FEV6-%Change-Post: -1 %
FEV6-%Pred-Post: 83 %
FEV6-%Pred-Pre: 85 %
FEV6-Post: 2.29 L
FEV6-Pre: 2.34 L
FEV6FVC-%Change-Post: 0 %
FEV6FVC-%Pred-Post: 103 %
FEV6FVC-%Pred-Pre: 103 %
FVC-%Change-Post: -1 %
FVC-%Pred-Post: 81 %
FVC-%Pred-Pre: 82 %
FVC-Post: 2.29 L
FVC-Pre: 2.34 L
Post FEV1/FVC ratio: 80 %
Post FEV6/FVC ratio: 100 %
Pre FEV1/FVC ratio: 79 %
Pre FEV6/FVC Ratio: 100 %
RV % pred: 93 %
RV: 2 L
TLC % pred: 75 %
TLC: 4.03 L

## 2016-05-31 MED ORDER — ALBUTEROL SULFATE (2.5 MG/3ML) 0.083% IN NEBU
2.5000 mg | INHALATION_SOLUTION | Freq: Once | RESPIRATORY_TRACT | Status: AC
  Administered 2016-05-31: 2.5 mg via RESPIRATORY_TRACT

## 2016-06-05 ENCOUNTER — Telehealth: Payer: Self-pay | Admitting: *Deleted

## 2016-06-05 NOTE — Telephone Encounter (Signed)
Call from pt - states when she walks, her back hurts, she's nauseated, and "can't breathe". And her doctor told her to call back if it continues; last saw Dr Quay Burow on 8/15./17. Denies having chest pain. Appt given tomorrow in [facility]. Pt instructed to go to ED if condition worsens; pt stated "I know that".

## 2016-06-05 NOTE — Telephone Encounter (Signed)
Agree with appt. 

## 2016-06-06 ENCOUNTER — Ambulatory Visit: Payer: Medicaid Other

## 2016-06-06 ENCOUNTER — Telehealth: Payer: Self-pay | Admitting: Internal Medicine

## 2016-06-06 NOTE — Telephone Encounter (Signed)
APT. REMINDER CALL, NO ANSWER, NO VOICEMAIL °

## 2016-06-07 ENCOUNTER — Ambulatory Visit: Payer: Medicaid Other

## 2016-06-15 ENCOUNTER — Other Ambulatory Visit: Payer: Self-pay | Admitting: Internal Medicine

## 2016-06-15 DIAGNOSIS — E785 Hyperlipidemia, unspecified: Secondary | ICD-10-CM

## 2016-06-15 DIAGNOSIS — I10 Essential (primary) hypertension: Secondary | ICD-10-CM

## 2016-06-15 DIAGNOSIS — E1142 Type 2 diabetes mellitus with diabetic polyneuropathy: Secondary | ICD-10-CM

## 2016-07-04 ENCOUNTER — Telehealth: Payer: Self-pay | Admitting: Internal Medicine

## 2016-07-04 NOTE — Telephone Encounter (Signed)
APT. REMINDER CALL, NO ANSWER, NO VOICEMAIL °

## 2016-07-05 ENCOUNTER — Ambulatory Visit (INDEPENDENT_AMBULATORY_CARE_PROVIDER_SITE_OTHER): Payer: Medicaid Other | Admitting: Internal Medicine

## 2016-07-05 DIAGNOSIS — F17210 Nicotine dependence, cigarettes, uncomplicated: Secondary | ICD-10-CM

## 2016-07-05 DIAGNOSIS — J069 Acute upper respiratory infection, unspecified: Secondary | ICD-10-CM

## 2016-07-05 DIAGNOSIS — R0981 Nasal congestion: Secondary | ICD-10-CM

## 2016-07-05 NOTE — Progress Notes (Signed)
   CC: Congestion, rhinorrhea, and cough  HPI:  [name] is a 60 y.o. female with PMHx detailed below presenting with congestion, rhinorrhea, and cough that has developed over the last week and a half.   See problem based assessment and plan below for additional details.  Past Medical History:  Diagnosis Date  . Adenomatous colon polyp 6/09   Resected on colonoscopy, no high grade dysplasia.   . Allergic rhinitis   . Bell's palsy 01/2009   Left sided  . Chest pain    Exercise stress test negative 6/07  . Colon polyps    03/22/2008: adenomatous polyps x3 w no dysplasia. Needs repeat colonoscopy in 3 years  . Diabetic peripheral neuropathy ([place])   . External hemorrhoid   . GERD (gastroesophageal reflux disease)   . Hyperlipidemia   . Hypertension   . Hypertensive retinopathy    Followed by Dr. Herbert Deaner.  . Insomnia   . Intolerance, drug    Leg cramps on Lipitor  . Lipoma 12/11   Posterior neck.   . Osteoarthritis    Carpometacarpal joint of right thumb  . Postmenopausal bleeding 9/05   Endometrial biopsy showed  FOCAL TUBAL METAPLASIA   . Postmenopausal symptoms    Hot flashes, vaginal dryness, iritability, difficulty sleeping. as of 2005.  Marland Kitchen Sebaceous cyst of breast    Has been refered to derm.  . Trigger finger    Right thumb  . Type II diabetes mellitus ([place])   . Uterine fibroid     Review of Systems: Review of Systems  Constitutional: Positive for malaise/fatigue. Negative for chills and fever.  HENT: Positive for congestion, ear pain and sore throat. Negative for ear discharge and hearing loss.   Respiratory: Positive for cough and sputum production. Negative for shortness of breath.   Cardiovascular: Negative for chest pain.  Gastrointestinal: Negative for abdominal pain, constipation and diarrhea.  Musculoskeletal: Negative for myalgias.  Neurological: Positive for headaches. Negative for dizziness.     Physical Exam: Vitals:   07/05/16 0942  BP:  (!) 160/76  Pulse: 80  Temp: 97.6 F (36.4 C)  TempSrc: Oral  SpO2: 100%  Weight: 182 lb 1.6 oz (82.6 kg)  Height: 5\' 7"  (1.702 m)   Body mass index is 28.52 kg/m. GENERAL- Woman sitting comfortably in exam room chair, alert, coughing and spitting phlegm HEENT- Atraumatic, EOMI, moist mucous membranes, frontal and maxillary sinuses tender to palpation, oropharynx mildly erythematous, tender cervical lymphadenopathy bilaterally, TMs clear with some serous fluid buildup, non-erythematous, non-exudative CARDIAC- Regular rate and rhythm, no murmurs, rubs or gallops. RESP- Clear to ascultation bilaterally, no wheezing or crackles, normal work of breathing ABDOMEN- Normoactive bowel sounds, soft, nontender, nondistended BACK- Kyphotic curvature, barrel-chested, no paraspinal tenderness EXTREMITIES- Normal bulk and range of motion, no edema, 2+ peripheral pulses SKIN- Warm, dry, intact, without visible rash PSYCH- Appropriate affect, clear speech, thoughts linear and goal-directed  Assessment & Plan:   See encounters tab for problem based medical decision making.  Patient seen with Dr. Dareen Piano

## 2016-07-05 NOTE — Assessment & Plan Note (Signed)
Reports 1.5 weeks of progressive URI symptoms. Began with sneezing/rhinorrhea, progressed to congestion, sore throat, now with cough productive of white sputum. Endorses subjective ear congestion and right ear pain. Denies fevers/chills. Tried Mucinex and Benadryl without symptom relief. Patient states that her daughter has come down with similar symptoms in the same span of time. She is having difficulty sleeping due to congestion, post-nasal drip, and cough. Patient is a longstanding 1 ppd smoker but has stopped during this illness, she has a prn Albuterol inhaler but has not had to use it. On exam lungs are clear and patient has sinus tenderness, lymphadenopathy, erythematous oropharynx, clear TMs, coughing up sputum often.  Assessment: viral URI  Plan:  - Supportive care with hydration and ample rest, OTCs for symptom relief - Advised to call/return if develops signs of bacterial sinusitis or pneumonia - would need to return for antibiotics and/or CXR

## 2016-07-05 NOTE — Patient Instructions (Addendum)
We think your symptoms are from a bad virus.   The best way to get over this illness to stay well hydrated and get plenty of sleep. You can take over the counter medications like mucinex, saline nasal spray, cough drops, Nyquil/Dayquil, etc. Also taking hot showers can help with congestion.  Remember to wash your hands often and cover your mouth when you cough to prevent transmission.  If you don't feel better in a week or develop high fevers please return to see [place].

## 2016-07-09 NOTE — Progress Notes (Signed)
Internal Medicine Clinic Attending  I saw and evaluated the patient.  I personally confirmed the key portions of the history and exam documented by Dr. Johnson and I reviewed pertinent patient test results.  The assessment, diagnosis, and plan were formulated together and I agree with the documentation in the resident's note.  

## 2016-07-12 IMAGING — CR DG CHEST 2V
2 series · 2 of 2 positions shown · non-contrast
Comparison: 07/24/2014

CLINICAL DATA: SOB. Pt reports having runny nose, productive cough
and sob x 10 days since they started painting her house. Pt also
states mold was found in her daughter's home in [place] and pt has
spent some time at her house since then. Hx HTN controlled with
medication, diabetes, and current smoker of 1 PPD.

EXAM:
CHEST  2 VIEW

[chest pa]
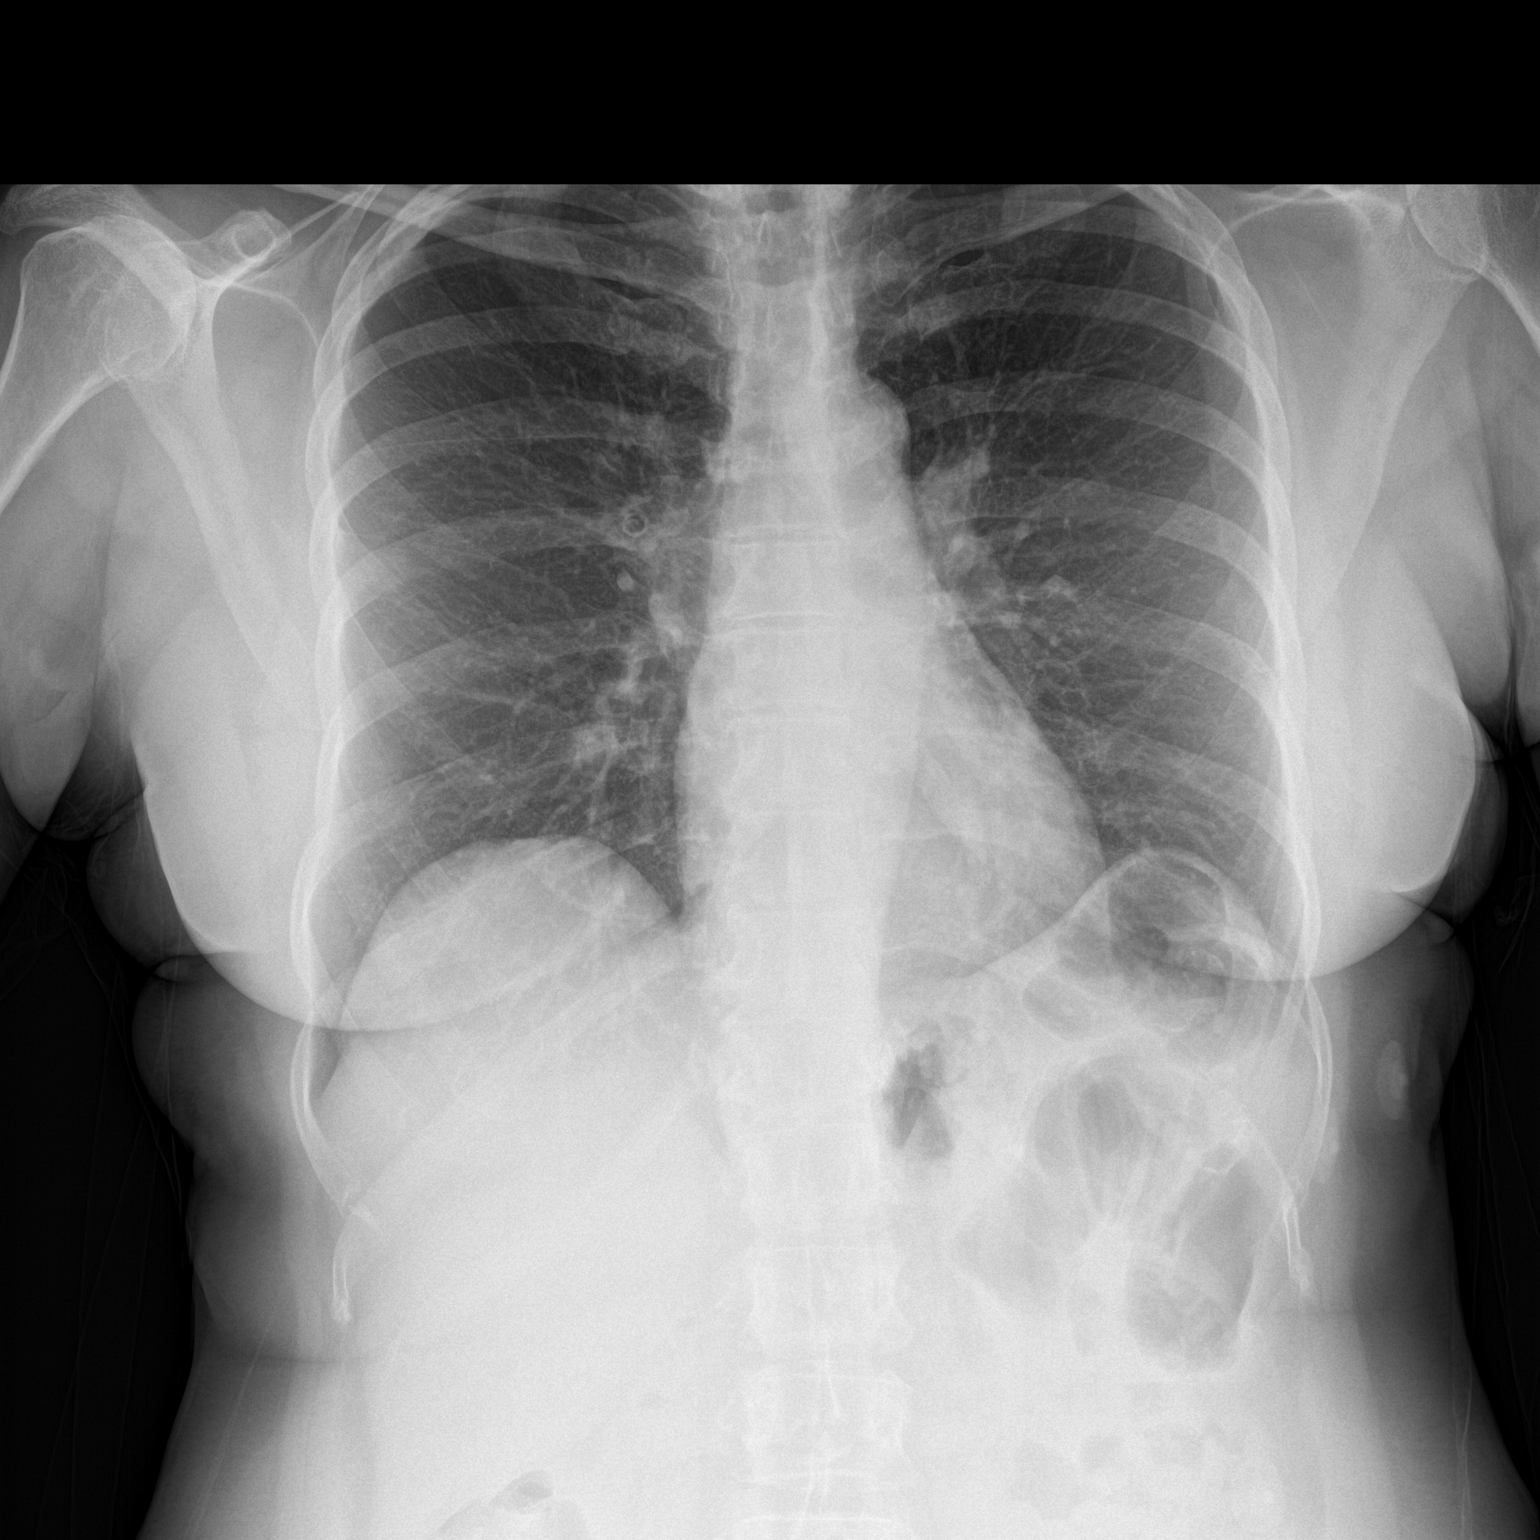

[chest lat]
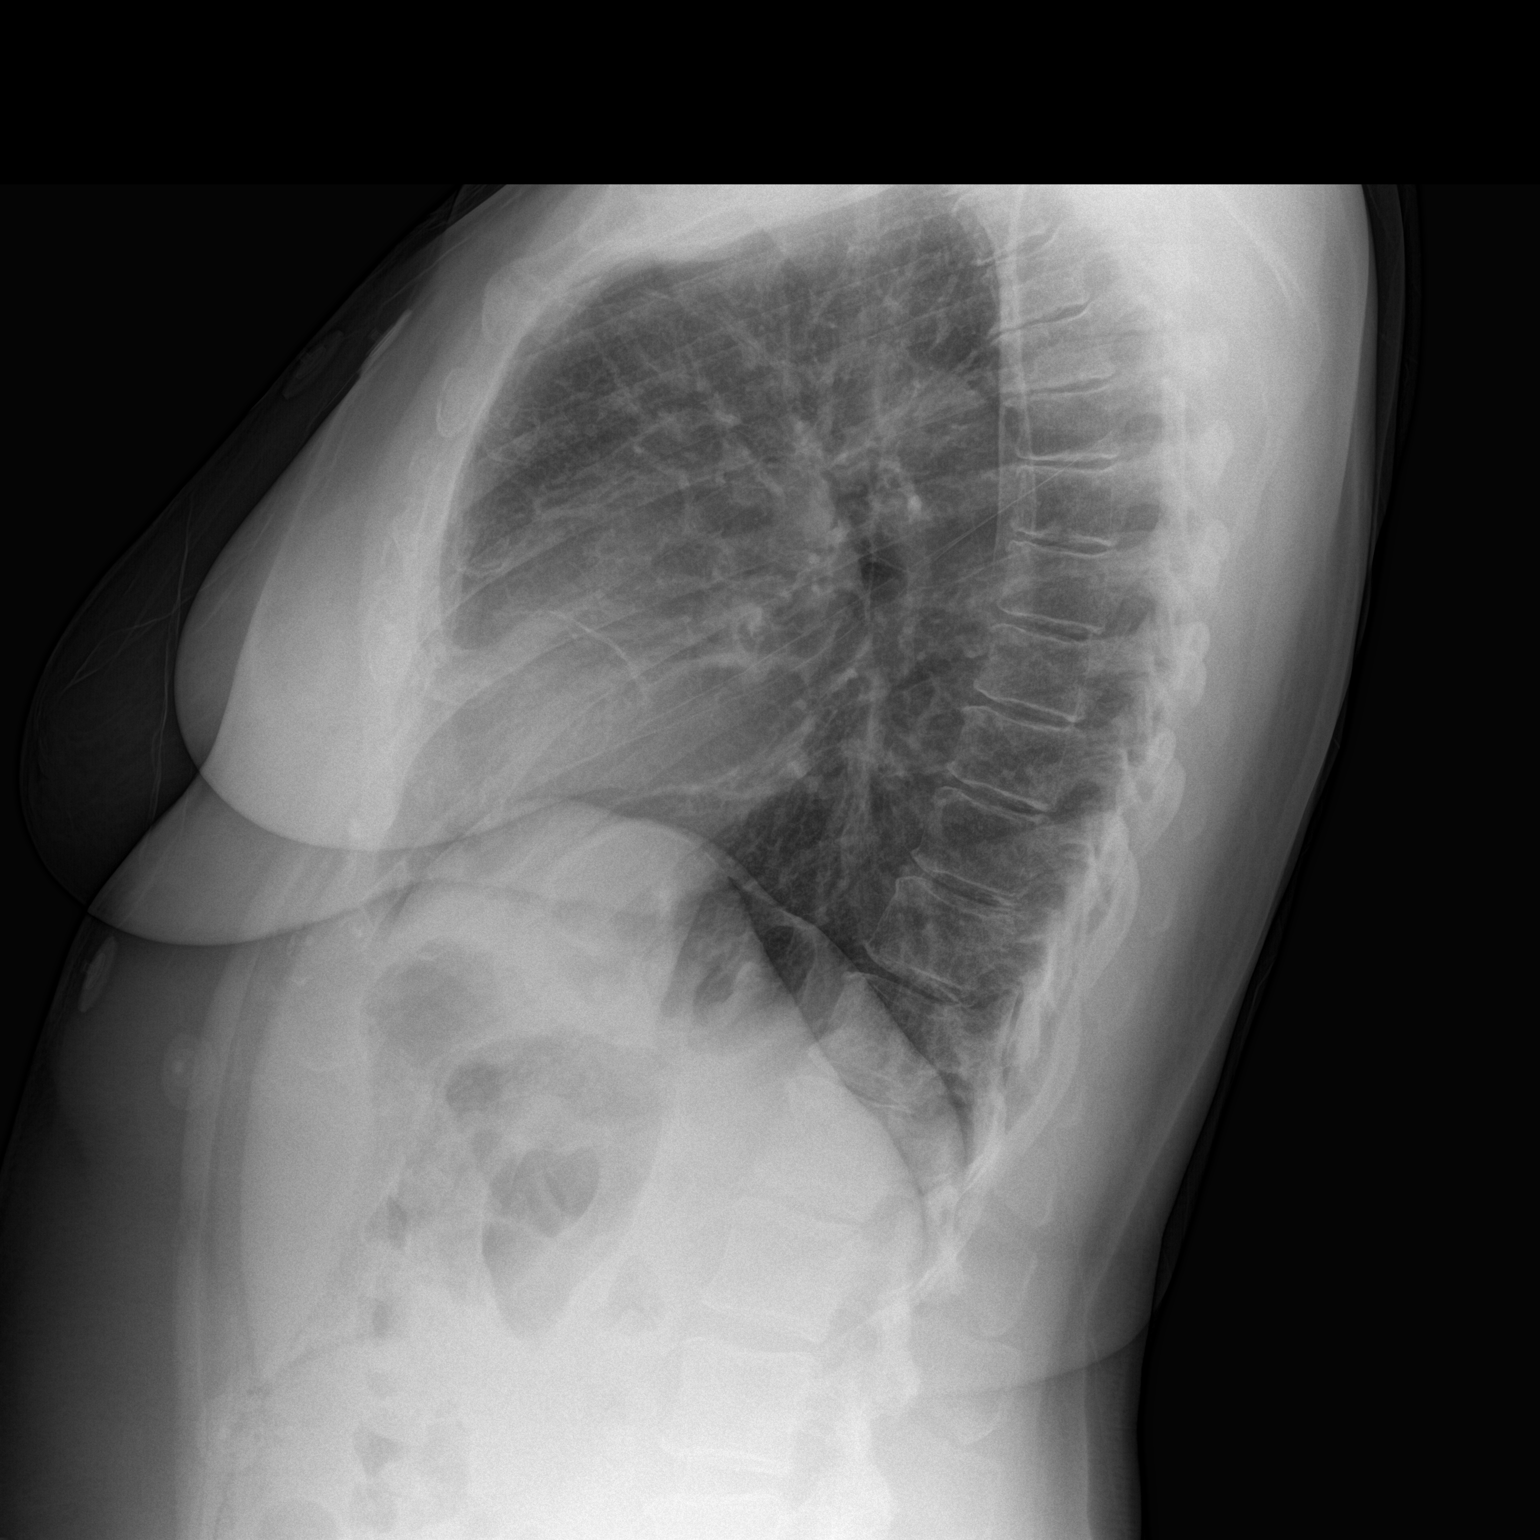

[2 of 2 positions shown; findings below may reference images not displayed]

FINDINGS: Cardiac silhouette normal in size and configuration. Normal
mediastinal and hilar contours.

Clear lungs.  No pleural effusion or pneumothorax.

Bony thorax is intact.
IMPRESSION: No active cardiopulmonary disease.

## 2016-07-24 ENCOUNTER — Telehealth: Payer: Self-pay | Admitting: Internal Medicine

## 2016-07-24 NOTE — Telephone Encounter (Signed)
Calling to check how pt is doing with the management of her chronic illness. Pt states that she will need refills on her prescriptions pretty soon, but she does not have money now. She states that her son will give her some money later today to go to [place] to refill them. She states that she checks her blood sugar this morning and it was 74, and after couple hours she recheck it it jumped to 280. Pt states that she does not understand why her blood sugar jump so fast. She states that she does not check her blood pressure regularly.  Pt complains that she has onset of headache and ear pain sometimes.  Pt states that she has her next appointment with the clinic on 08/06/2016.

## 2016-07-26 ENCOUNTER — Telehealth: Payer: Self-pay | Admitting: *Deleted

## 2016-07-27 ENCOUNTER — Ambulatory Visit (INDEPENDENT_AMBULATORY_CARE_PROVIDER_SITE_OTHER): Payer: Medicaid Other | Admitting: Internal Medicine

## 2016-07-27 VITALS — BP 151/97 | HR 81 | Temp 97.4°F | Ht 67.0 in | Wt 185.0 lb

## 2016-07-27 DIAGNOSIS — E785 Hyperlipidemia, unspecified: Secondary | ICD-10-CM

## 2016-07-27 DIAGNOSIS — Z836 Family history of other diseases of the respiratory system: Secondary | ICD-10-CM

## 2016-07-27 DIAGNOSIS — R0609 Other forms of dyspnea: Secondary | ICD-10-CM

## 2016-07-27 DIAGNOSIS — M7918 Myalgia, other site: Secondary | ICD-10-CM

## 2016-07-27 DIAGNOSIS — R05 Cough: Secondary | ICD-10-CM

## 2016-07-27 DIAGNOSIS — F17210 Nicotine dependence, cigarettes, uncomplicated: Secondary | ICD-10-CM | POA: Diagnosis not present

## 2016-07-27 DIAGNOSIS — E1142 Type 2 diabetes mellitus with diabetic polyneuropathy: Secondary | ICD-10-CM

## 2016-07-27 DIAGNOSIS — E119 Type 2 diabetes mellitus without complications: Secondary | ICD-10-CM | POA: Diagnosis not present

## 2016-07-27 DIAGNOSIS — I10 Essential (primary) hypertension: Secondary | ICD-10-CM

## 2016-07-27 DIAGNOSIS — R0600 Dyspnea, unspecified: Secondary | ICD-10-CM

## 2016-07-27 LAB — GLUCOSE, CAPILLARY: Glucose-Capillary: 245 mg/dL — ABNORMAL HIGH (ref 65–99)

## 2016-07-27 LAB — POCT GLYCOSYLATED HEMOGLOBIN (HGB A1C): Hemoglobin A1C: 8.9

## 2016-07-27 MED ORDER — MELOXICAM 15 MG PO TABS: 15.0000 mg | ORAL_TABLET | Freq: Every day | ORAL | 0 refills | Status: DC

## 2016-07-27 MED ORDER — ATORVASTATIN CALCIUM 40 MG PO TABS: ORAL_TABLET | ORAL | 0 refills | Status: DC

## 2016-07-27 MED ORDER — OLMESARTAN-AMLODIPINE-HCTZ 40-5-25 MG PO TABS: 1.0000 | ORAL_TABLET | Freq: Every day | ORAL | 3 refills | Status: DC

## 2016-07-27 MED ORDER — ALBUTEROL SULFATE HFA 108 (90 BASE) MCG/ACT IN AERS: 1.0000 | INHALATION_SPRAY | Freq: Four times a day (QID) | RESPIRATORY_TRACT | 3 refills | Status: DC | PRN

## 2016-07-27 MED ORDER — GLIMEPIRIDE 2 MG PO TABS: ORAL_TABLET | ORAL | 3 refills | Status: DC

## 2016-07-27 NOTE — Assessment & Plan Note (Addendum)
Her worsening dyspnea and chronic cough over the past couple of years in context of her extensive smoking history were highly suggestive of COPD.  However, recent PFTs in 05/2016 show diffusion deficit (DLCO 46%) without significant changes in lung volumes or obstruction.  With concern for malignancy and ILD and story of recent hemoptysis, I think advanced lung imaging is warranted. -CBC -Quantiferon -HR Chest CT -Smoking cessation

## 2016-07-27 NOTE — Patient Instructions (Addendum)
You were seen today for your chronic cough and shortness of breath.  I'm not sure why  You're coughing, but we need to do some more tests to figure out why.  I've ordered some blood tests and a CT scan of your chest get more information.  If you cough up more blood, please call the clinic again for another appointment.  If its lots of blood and won't stop, call 911 and go to the ER.  I'll see you on 10/30 for your regular appointment.

## 2016-07-27 NOTE — Progress Notes (Signed)
CC: cough  HPI:  [name] is a 60 y.o. woman with history of HTN, DM2, and tobacco abuse who presents with chronic cough, dyspnea, and new hemoptysis.  Persistent cough for the last 2-3 months which is worsening.  Productive of thin mucous, which has had a pinkish tinge for the last 2 weeks, and yesterday coughed up 2 "red hunks" followed by self-limited epistaxis lasting 1-2 minutes, resolved with pressure and tilting head backwards..  She sometimes wheezes at night.  Uses albuterol inhaler during the day when she feels short of breath which sometimes helps a little.  Breathing doesn't limit walking, and she can walk "a really long way".  She is currently smoking 1/2 pack per day, and has a 40+ pack year history.  She had PFTs performed on 05/31/16 which showed diffusion deficit (DLCO 46%) but no obstruction.  Her most recent CXR a year ago was normal, and there is no chest CT in her records.  Has hat hot flashes ever since 60 years old.  Good appetite.  Weight stable.  Parents had TB.  Dry eyes and mouth.  No nose/mouth/genital sores.  She was last seen in clinic on 9/28 for rhinorrhea, congestion, and cough deemed to be a viral URI. Conservative management was advised with symptomatic treatment with OTC meds.   Past Medical History:  Diagnosis Date  . Adenomatous colon polyp 6/09   Resected on colonoscopy, no high grade dysplasia.   . Allergic rhinitis   . Bell's palsy 01/2009   Left sided  . Chest pain    Exercise stress test negative 6/07  . Colon polyps    03/22/2008: adenomatous polyps x3 w no dysplasia. Needs repeat colonoscopy in 3 years  . Diabetic peripheral neuropathy ([place])   . External hemorrhoid   . GERD (gastroesophageal reflux disease)   . Hyperlipidemia   . Hypertension   . Hypertensive retinopathy    Followed by Dr. Herbert Deaner.  . Insomnia   . Intolerance, drug    Leg cramps on Lipitor  . Lipoma 12/11   Posterior neck.   . Osteoarthritis    Carpometacarpal joint of right thumb  . Postmenopausal bleeding 9/05   Endometrial biopsy showed  FOCAL TUBAL METAPLASIA   . Postmenopausal symptoms    Hot flashes, vaginal dryness, iritability, difficulty sleeping. as of 2005.  Marland Kitchen Sebaceous cyst of breast    Has been refered to derm.  . Trigger finger    Right thumb  . Type II diabetes mellitus ([place])   . Uterine fibroid     Review of Systems:   Review of Systems  Constitutional: Positive for malaise/fatigue. Negative for chills, fever and weight loss.       Hot flashes since age 27  HENT: Positive for nosebleeds.        Headache with coughing  Eyes: Negative for double vision, pain and redness.  Respiratory: Positive for cough, hemoptysis, sputum production, shortness of breath and wheezing.   Cardiovascular: Positive for chest pain. Negative for palpitations, orthopnea, leg swelling and PND.  Gastrointestinal: Positive for constipation. Negative for abdominal pain, blood in stool, diarrhea, melena, nausea and vomiting.  Genitourinary: Negative for dysuria and hematuria.  Musculoskeletal: Negative for joint pain and myalgias.  Skin: Negative for rash.  Endo/Heme/Allergies: Does not bruise/bleed easily.    Physical Exam:  Vitals:   07/27/16 0957  BP: (!) 151/97  Pulse: 81  Temp: 97.4 F (36.3 C)  TempSrc: Oral  SpO2: 100%  Weight: 185 lb (  83.9 kg)  Height: 5\' 7"  (1.702 m)   Physical Exam  Constitutional: She is oriented to person, place, and time. She appears well-developed and well-nourished. No distress.  HENT:  Edentulous Oropharynx clear and moist without ulceration, erythema or exudate Nose normal with ulceration or bleeding  Eyes: Conjunctivae are normal. No scleral icterus.  Cardiovascular: Normal rate and regular rhythm.   Pulmonary/Chest:  Normal effort and rate of breathing Good air movement throughout No crackles or wheezes  Musculoskeletal: She exhibits no edema or tenderness.  Lymphadenopathy:        Head (right side): No submental, no submandibular, no preauricular and no posterior auricular adenopathy present.       Head (left side): No submental, no submandibular, no preauricular and no posterior auricular adenopathy present.    She has no cervical adenopathy.    She has no axillary adenopathy.       Right: No supraclavicular adenopathy present.       Left: No supraclavicular adenopathy present.  Neurological: She is alert and oriented to person, place, and time.  Skin: Skin is warm and dry.  Psychiatric: She has a normal mood and affect. Her behavior is normal.    CBC Latest Ref Rng & Units 06/03/2015 03/05/2014 10/24/2013  WBC 4.0 - 10.5 K/uL 5.8 6.1 7.6  Hemoglobin 12.0 - 15.0 g/dL 12.6 12.2 15.5(H)  Hematocrit 36.0 - 46.0 % 37.4 37.3 42.6  Platelets 150 - 400 K/uL 249 261 301    Component     Latest Ref Rng & Units 05/31/2016  FVC-Pre     L 2.34  FVC-%Pred-Pre     % 82  FVC-Post     L 2.29  FVC-%Pred-Post     % 81  FVC-%Change-Post     % -1  FEV1-Pre     L 1.85  FEV1-%Pred-Pre     % 83  FEV1-Post     L 1.83  FEV1-%Pred-Post     % 82  FEV1-%Change-Post     % -1  FEV6-Pre     L 2.34  FEV6-%Pred-Pre     % 85  FEV6-Post     L 2.29  FEV6-%Pred-Post     % 83  FEV6-%Change-Post     % -1  Pre FEV1/FVC ratio     % 79  FEV1FVC-%Pred-Pre     % 100  Post FEV1/FVC ratio     % 80  FEV1FVC-%Change-Post     % 0  Pre FEV6/FVC Ratio     % 100  FEV6FVC-%Pred-Pre     % 103  Post FEV6/FVC ratio     % 100  FEV6FVC-%Pred-Post     % 103  FEV6FVC-%Change-Post     % 0  FEF 25-75 Pre     L/sec 1.73  FEF2575-%Pred-Pre     % 81  FEF 25-75 Post     L/sec 1.65  FEF2575-%Pred-Post     % 77  FEF2575-%Change-Post     % -4  RV     L 2.00  RV % pred     % 93  TLC     L 4.03  TLC % pred     % 75  DLCO unc     ml/min/mmHg 12.57  DLCO unc % pred     % 46  DL/VA     ml/min/mmHg/L 3.95  DL/VA % pred     % 78   Chest Radiographs  (06/03/2015) FINDINGS: Cardiac silhouette normal in size and configuration. Normal mediastinal  and hilar contours.  Clear lungs.  No pleural effusion or pneumothorax.  Bony thorax is intact.  IMPRESSION: No active cardiopulmonary disease.  Assessment & Plan:   See Encounters Tab for problem based charting.  Patient seen with Dr. Angelia Mould

## 2016-07-28 LAB — CBC WITH DIFFERENTIAL/PLATELET
Basophils Absolute: 0 10*3/uL (ref 0.0–0.2)
Basos: 1 %
EOS (ABSOLUTE): 0.1 10*3/uL (ref 0.0–0.4)
Eos: 2 %
Hematocrit: 36.2 % (ref 34.0–46.6)
Hemoglobin: 11.8 g/dL (ref 11.1–15.9)
Immature Grans (Abs): 0 10*3/uL (ref 0.0–0.1)
Immature Granulocytes: 0 %
Lymphocytes Absolute: 3 10*3/uL (ref 0.7–3.1)
Lymphs: 44 %
MCH: 28.4 pg (ref 26.6–33.0)
MCHC: 32.6 g/dL (ref 31.5–35.7)
MCV: 87 fL (ref 79–97)
Monocytes Absolute: 0.3 10*3/uL (ref 0.1–0.9)
Monocytes: 5 %
Neutrophils Absolute: 3.4 10*3/uL (ref 1.4–7.0)
Neutrophils: 48 %
Platelets: 309 10*3/uL (ref 150–379)
RBC: 4.15 x10E6/uL (ref 3.77–5.28)
RDW: 12.8 % (ref 12.3–15.4)
WBC: 6.9 10*3/uL (ref 3.4–10.8)

## 2016-07-30 LAB — QUANTIFERON TB GOLD ASSAY (BLOOD)

## 2016-07-30 LAB — QUANTIFERON IN TUBE
QFT TB AG MINUS NIL VALUE: 0 IU/mL
QUANTIFERON MITOGEN VALUE: >10 IU/mL
QUANTIFERON TB AG VALUE: 0.02 IU/mL
QUANTIFERON TB GOLD: NEGATIVE
Quantiferon Nil Value: 0.02 IU/mL

## 2016-07-30 NOTE — Progress Notes (Signed)
Internal Medicine Clinic Attending  I saw and evaluated the patient.  I personally confirmed the key portions of the history and exam documented by Dr. O'Sullivan and I reviewed pertinent patient test results.  The assessment, diagnosis, and plan were formulated together and I agree with the documentation in the resident's note.   

## 2016-07-31 ENCOUNTER — Encounter: Payer: Self-pay | Admitting: Internal Medicine

## 2016-08-03 ENCOUNTER — Ambulatory Visit (HOSPITAL_COMMUNITY): Admission: RE | Admit: 2016-08-03 | Payer: Medicaid Other | Source: Ambulatory Visit

## 2016-08-05 NOTE — Assessment & Plan Note (Addendum)
Lab Results  Component Value Date   HGBA1C 8.9 07/27/2016   HGBA1C 8.2 03/27/2016   HGBA1C 8.0 11/23/2015    No results for input(s): GLUCAP in the last 72 hours.   Current medications: metformin 1000 mg BID, glimepride 2 mg daily Current insulin: none  Assessment HgbA1c goal: 7.0 Glycemic control: uncontrolled Complications: peripheral neuropathy  Plan Medications: continue metformin 1000 mg BID, increase glimepride to 4 mg daily with largest meal Insulin: none Other: -encourage diet, exercise, and weight loss

## 2016-08-05 NOTE — Progress Notes (Signed)
CC: L side pain  HPI:  Bridget Owens is a 60 y.o.  woman with history of HTN, DM2, and tobacco abuse who presents for further evaluation of cough, dyspnea, and hemoptysis.   Please see A&P for status of the patient's chronic medical conditions.  Since she was last seen on 10/20, she things her breathing has improved, although the continues to have productive cough and expectorated pink-tinged mucous last Wednesday.  She thinks she is coughing when her mouth is dry.  Has not been walking as much now that the weather is cold.  She did not have the ordered CT chest done because she was not at home when they called to schedule it.  In the past week, she has developed left flank and left upper abdominal pain, that is sharp and constant.  She noticed it suddenly at night a bit over a week ago.  Not associated with eating, movement, coughing, or anything else.  She has been taking 2 Tylenol PM at night to help her sleep, but no other pain meds.  No change in bowel habits.  She endorses chronic constipation, with BM every 2-3 days.  No melena or hematochezia.  Past Medical History:  Diagnosis Date  . Adenomatous colon polyp 6/09   Resected on colonoscopy, no high grade dysplasia.   . Allergic rhinitis   . Bell's palsy 01/2009   Left sided  . Chest pain    Exercise stress test negative 6/07  . Colon polyps    03/22/2008: adenomatous polyps x3 w no dysplasia. Needs repeat colonoscopy in 3 years  . Diabetic peripheral neuropathy ([place])   . External hemorrhoid   . GERD (gastroesophageal reflux disease)   . Hyperlipidemia   . Hypertension   . Hypertensive retinopathy    Followed by Dr. Herbert Deaner.  . Insomnia   . Intolerance, drug    Leg cramps on Lipitor  . Lipoma 12/11   Posterior neck.   . Osteoarthritis    Carpometacarpal joint of right thumb  . Postmenopausal bleeding 9/05   Endometrial biopsy showed  FOCAL TUBAL METAPLASIA   . Postmenopausal symptoms    Hot flashes, vaginal  dryness, iritability, difficulty sleeping. as of 2005.  Marland Kitchen Sebaceous cyst of breast    Has been refered to derm.  . Trigger finger    Right thumb  . Type II diabetes mellitus ([place])   . Uterine fibroid     Review of Systems:   Review of Systems  Constitutional: Negative for chills and fever.  Respiratory: Positive for cough, hemoptysis and sputum production. Negative for shortness of breath and wheezing.   Cardiovascular: Negative for chest pain and palpitations.  Gastrointestinal: Positive for abdominal pain and constipation. Negative for blood in stool and melena.  Musculoskeletal:       Pain in left lateral and posterior lower ribs    Physical Exam:  Vitals:   08/06/16 1506 08/06/16 1507  BP: (!) 175/81 (!) 150/80  Pulse: 89 80  Temp: 97.8 F (36.6 C)   TempSrc: Core (Comment)   SpO2: 100%   Weight: 183 lb 12.8 oz (83.4 kg)   Height:  5\' 7"  (1.702 m)   Physical Exam  Constitutional: She is oriented to person, place, and time. She appears well-developed and well-nourished. No distress.  Cardiovascular: Normal rate and regular rhythm.   Pulmonary/Chest: Effort normal and breath sounds normal. She has no wheezes. She has no rales.  Abdominal: Soft. Bowel sounds are normal. She exhibits no distension  and no mass.  Tender to palpation LUQ  Musculoskeletal:  Tenderness to palpation over L lateral and posterior 9th-10th ribs  Neurological: She is alert and oriented to person, place, and time.  Psychiatric: She has a normal mood and affect. Her behavior is normal.    CBC Latest Ref Rng & Units 07/27/2016 06/03/2015 03/05/2014  WBC 3.4 - 10.8 x10E3/uL 6.9 5.8 6.1  Hemoglobin 12.0 - 15.0 g/dL - 12.6 12.2  Hematocrit 34.0 - 46.6 % 36.2 37.4 37.3  Platelets 150 - 379 x10E3/uL 309 249 261   Component     Latest Ref Rng & Units 07/27/2016  QUANTIFERON TB GOLD     Negative Negative    Assessment & Plan:   See Encounters Tab for problem based charting.  Patient seen with  Dr. Evette Doffing

## 2016-08-05 NOTE — Assessment & Plan Note (Addendum)
BP Readings from Last 3 Encounters:  08/06/16 (!) 150/80  07/27/16 (!) 151/97  07/05/16 (!) 160/76   Lab Results  Component Value Date   CREATININE 0.77 11/23/2015   Lab Results  Component Value Date   K 4.5 11/23/2015    Current medications: olmesartan-amlodipine-HCTZ 40-5-25 mg daily  Assessment BP goal: 140/90 BP control: above goal  Plan Medications: continue olmesartan-amlodipine-HCTZ 40-5-25 mg daily Other: -encouraged exercise and weight loss

## 2016-08-05 NOTE — Assessment & Plan Note (Addendum)
With her significant smoking history, chronic cough, and reported hemoptysis, high concern for malignancy.  CBC was normal, quantiferon negative.  PFTs showed isolated diffusion deficit.  She missed her scheduled chest CT. 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 -Repeat HR chest CT in 6 months -Pulmonology referral

## 2016-08-06 ENCOUNTER — Encounter: Payer: Self-pay | Admitting: Internal Medicine

## 2016-08-06 ENCOUNTER — Ambulatory Visit (INDEPENDENT_AMBULATORY_CARE_PROVIDER_SITE_OTHER): Payer: Medicaid Other | Admitting: Internal Medicine

## 2016-08-06 VITALS — BP 150/80 | HR 80 | Temp 97.8°F | Ht 67.0 in | Wt 183.8 lb

## 2016-08-06 DIAGNOSIS — Z7984 Long term (current) use of oral hypoglycemic drugs: Secondary | ICD-10-CM | POA: Diagnosis not present

## 2016-08-06 DIAGNOSIS — R042 Hemoptysis: Secondary | ICD-10-CM | POA: Diagnosis not present

## 2016-08-06 DIAGNOSIS — R0600 Dyspnea, unspecified: Secondary | ICD-10-CM

## 2016-08-06 DIAGNOSIS — E1142 Type 2 diabetes mellitus with diabetic polyneuropathy: Secondary | ICD-10-CM

## 2016-08-06 DIAGNOSIS — R0781 Pleurodynia: Secondary | ICD-10-CM | POA: Diagnosis not present

## 2016-08-06 DIAGNOSIS — Z79899 Other long term (current) drug therapy: Secondary | ICD-10-CM | POA: Diagnosis not present

## 2016-08-06 DIAGNOSIS — K5909 Other constipation: Secondary | ICD-10-CM

## 2016-08-06 DIAGNOSIS — F17210 Nicotine dependence, cigarettes, uncomplicated: Secondary | ICD-10-CM

## 2016-08-06 DIAGNOSIS — I10 Essential (primary) hypertension: Secondary | ICD-10-CM

## 2016-08-06 DIAGNOSIS — R1012 Left upper quadrant pain: Secondary | ICD-10-CM

## 2016-08-06 DIAGNOSIS — R0609 Other forms of dyspnea: Secondary | ICD-10-CM | POA: Diagnosis not present

## 2016-08-06 MED ORDER — GLIMEPIRIDE 4 MG PO TABS: 4.0000 mg | ORAL_TABLET | Freq: Every day | ORAL | 3 refills | Status: DC

## 2016-08-06 NOTE — Patient Instructions (Addendum)
You were seen today for follow-up of your shortness of breath and diabetes.  For your lungs, it is important you quit smoking.  I would still like to get that CT scan of your chest.  If you don't hear from anyone to schedule it in the next week, please call the clinic.  For your diabetes, I would look to increase the glimepride (amaryl) to 4 mg once daily before breakfast or before your largest meal.  I think the pain in your left side is likely a pulled muscle from coughing.  If it doesn't get better, or you feel sick, please call for an appointment right away.  I'll see you again in 3 months.

## 2016-08-07 ENCOUNTER — Encounter: Payer: Self-pay | Admitting: Internal Medicine

## 2016-08-07 DIAGNOSIS — R1012 Left upper quadrant pain: Secondary | ICD-10-CM | POA: Insufficient documentation

## 2016-08-07 LAB — BMP8+ANION GAP
Anion Gap: 17 mmol/L (ref 10.0–18.0)
BUN/Creatinine Ratio: 18 (ref 12–28)
BUN: 15 mg/dL (ref 8–27)
CO2: 22 mmol/L (ref 18–29)
Calcium: 10.1 mg/dL (ref 8.7–10.3)
Chloride: 100 mmol/L (ref 96–106)
Creatinine, Ser: 0.83 mg/dL (ref 0.57–1.00)
GFR calc Af Amer: 87 mL/min/{1.73_m2} (ref >59)
GFR calc non Af Amer: 76 mL/min/{1.73_m2} (ref >59)
Glucose: 233 mg/dL — ABNORMAL HIGH (ref 65–99)
Potassium: 4.2 mmol/L (ref 3.5–5.2)
Sodium: 139 mmol/L (ref 134–144)

## 2016-08-07 NOTE — Assessment & Plan Note (Signed)
Sharp, constant left-sided pain with foci over lateral 9th and 10th ribs and mild tenderness in LUQ.  No history of trauma.  No CVA tenderness or splenomegaly on exam.  No alleviated or exacerbating factors, no signs of systemic illness.  I think her pain is musculoskeletal, consistent with intercostal  -Anticipatory guidance given - RTC if not improving in 1-2 weeks, or fever, change in bowel habits

## 2016-08-08 NOTE — Progress Notes (Signed)
Internal Medicine Clinic Attending  I saw and evaluated the patient.  I personally confirmed the key portions of the history and exam documented by Dr. O'Sullivan and I reviewed pertinent patient test results.  The assessment, diagnosis, and plan were formulated together and I agree with the documentation in the resident's note.   

## 2016-08-14 ENCOUNTER — Ambulatory Visit (HOSPITAL_COMMUNITY)
Admission: RE | Admit: 2016-08-14 | Discharge: 2016-08-14 | Disposition: A | Discharge status: Home / Self Care | Payer: Medicaid Other | Source: Ambulatory Visit | Attending: Internal Medicine | Admitting: Internal Medicine

## 2016-08-14 DIAGNOSIS — R918 Other nonspecific abnormal finding of lung field: Secondary | ICD-10-CM | POA: Diagnosis not present

## 2016-08-14 DIAGNOSIS — I700 Atherosclerosis of aorta: Secondary | ICD-10-CM | POA: Diagnosis not present

## 2016-08-14 DIAGNOSIS — I2510 Atherosclerotic heart disease of native coronary artery without angina pectoris: Secondary | ICD-10-CM | POA: Diagnosis not present

## 2016-08-14 DIAGNOSIS — R0609 Other forms of dyspnea: Secondary | ICD-10-CM | POA: Diagnosis not present

## 2016-08-14 DIAGNOSIS — J439 Emphysema, unspecified: Secondary | ICD-10-CM | POA: Diagnosis not present

## 2016-08-14 DIAGNOSIS — M47814 Spondylosis without myelopathy or radiculopathy, thoracic region: Secondary | ICD-10-CM | POA: Diagnosis not present

## 2016-08-14 DIAGNOSIS — F17210 Nicotine dependence, cigarettes, uncomplicated: Secondary | ICD-10-CM | POA: Insufficient documentation

## 2016-08-14 DIAGNOSIS — R0600 Dyspnea, unspecified: Secondary | ICD-10-CM

## 2016-08-15 ENCOUNTER — Encounter: Payer: Self-pay | Admitting: Internal Medicine

## 2016-08-15 NOTE — Addendum Note (Signed)
Addended by: Rica Records on: 08/15/2016 01:54 PM   Modules accepted: Orders

## 2016-08-17 ENCOUNTER — Ambulatory Visit: Payer: Medicaid Other

## 2016-08-23 ENCOUNTER — Ambulatory Visit (INDEPENDENT_AMBULATORY_CARE_PROVIDER_SITE_OTHER): Payer: Medicaid Other | Admitting: Internal Medicine

## 2016-08-23 ENCOUNTER — Encounter: Payer: Self-pay | Admitting: Internal Medicine

## 2016-08-23 DIAGNOSIS — R1084 Generalized abdominal pain: Secondary | ICD-10-CM | POA: Diagnosis not present

## 2016-08-23 DIAGNOSIS — L98429 Non-pressure chronic ulcer of back with unspecified severity: Secondary | ICD-10-CM

## 2016-08-23 DIAGNOSIS — I10 Essential (primary) hypertension: Secondary | ICD-10-CM

## 2016-08-23 DIAGNOSIS — R0781 Pleurodynia: Secondary | ICD-10-CM

## 2016-08-23 DIAGNOSIS — R05 Cough: Secondary | ICD-10-CM

## 2016-08-23 DIAGNOSIS — R109 Unspecified abdominal pain: Secondary | ICD-10-CM

## 2016-08-23 DIAGNOSIS — R203 Hyperesthesia: Secondary | ICD-10-CM

## 2016-08-23 MED ORDER — GABAPENTIN 300 MG PO CAPS: 300.0000 mg | ORAL_CAPSULE | Freq: Every day | ORAL | 0 refills | Status: DC

## 2016-08-23 NOTE — Progress Notes (Signed)
   CC: left side pain  HPI: Ms.Nyonna L Owens is a 60 y.o. with past medical history as outlined below who presents to acute care clinic for follow up of pain over her left side. Three months ago she developed gradual onset of left-sided burning pain. Pain starts on her left side just under her ribs and radiates around her stomach to her navel. The pain keeps her up at night and it is uncomfortable for her to wear clothing that touches the area. Walking around alleviates a lot of the pain but heat only provided mild relief. Tylenol and ibuprofen provided no relief. She does have a chronic cough and wonders if this could be related to the pain. She has not noticed a rash over the area at any point in the past. She reports no trauma to the area.   Please see problem list for status of the pt's chronic medical problems.  Past Medical History:  Diagnosis Date  . Adenomatous colon polyp 6/09   Resected on colonoscopy, no high grade dysplasia.   . Allergic rhinitis   . Bell's palsy 01/2009   Left sided  . Chest pain    Exercise stress test negative 6/07  . Colon polyps    03/22/2008: adenomatous polyps x3 w no dysplasia. Needs repeat colonoscopy in 3 years  . Diabetic peripheral neuropathy ([place])   . External hemorrhoid   . GERD (gastroesophageal reflux disease)   . Hyperlipidemia   . Hypertension   . Hypertensive retinopathy    Followed by Dr. Herbert Deaner.  . Insomnia   . Intolerance, drug    Leg cramps on Lipitor  . Lipoma 12/11   Posterior neck.   . Osteoarthritis    Carpometacarpal joint of right thumb  . Postmenopausal bleeding 9/05   Endometrial biopsy showed  FOCAL TUBAL METAPLASIA   . Postmenopausal symptoms    Hot flashes, vaginal dryness, iritability, difficulty sleeping. as of 2005.  Marland Kitchen Sebaceous cyst of breast    Has been refered to derm.  . Trigger finger    Right thumb  . Type II diabetes mellitus ([place])   . Uterine fibroid    Review of Systems:  Please see each problem  below for a pertinent review of systems. Review of Systems  Constitutional: Negative for chills and fever.  Respiratory: Negative for cough.   Gastrointestinal: Negative for constipation and diarrhea.  Genitourinary: Negative for dysuria and hematuria.   Physical Exam:  Vitals:   08/23/16 1317  BP: (!) 170/78  Pulse: 98  Temp: 97.7 F (36.5 C)  TempSrc: Oral  SpO2: 100%  Weight: 182 lb (82.6 kg)  Height: 5\' 6"  (1.676 m)   Physical Exam  Cardiovascular: Normal rate and regular rhythm.   Abdominal: Soft. Bowel sounds are normal. She exhibits no distension. There is tenderness.  Skin darkening and tenderness to light touch over left middle quadrant.   Genitourinary:  Genitourinary Comments: No CVA tenderness  Skin:  1cm ulcerated lesion to the left of her lower thoracic spine    Assessment & Plan:   See Encounters Tab for problem based charting.  Patient seen with Dr. Eppie Gibson

## 2016-08-23 NOTE — Patient Instructions (Signed)
It was a pleasure to meet you today Ms. Makarewicz  START taking gabapentin 300 mg daily at bedtime If your pain is not improved in one week you can start taking an additional pill in the morning   Schedule an appointment to see Dr. Inda Castle in one month

## 2016-08-24 DIAGNOSIS — R109 Unspecified abdominal pain: Secondary | ICD-10-CM | POA: Insufficient documentation

## 2016-08-24 NOTE — Assessment & Plan Note (Signed)
Initial blood pressure 170/78. Manual recheck 138/80.   Continue olmesartan-amlodipine-HCTZ 40-5-25 mg daily

## 2016-08-24 NOTE — Assessment & Plan Note (Signed)
Three months ago she developed gradual onset of left-sided burning pain. Pain starts on her left side just under her ribs and radiates around her stomach to her navel. The pain keeps her up at night and it is uncomfortable for her to wear clothing that touches the area. Walking around alleviates a lot of the pain but heat only provided mild relief. Tylenol and ibuprofen provided no relief. She does have a chronic cough and wonders if this could be related to the pain. She has not noticed a rash over the area at any point in the past. She reports no trauma to the area.   On exam she has extreme tenderness to light tough over her left side and abdomen in the T10 dermatome distribution.   Post herpetic neuralgia  Prescribed trial of gabapentin 300 mg daily at bedtime If her pain has not improved in one week she can increase to gabapentin 300 mg BID

## 2016-08-24 NOTE — Telephone Encounter (Signed)
review 

## 2016-08-26 NOTE — Progress Notes (Signed)
I saw and evaluated the patient.  I personally confirmed the key portions of Dr. Fredrik Cove history and exam and reviewed pertinent patient test results.  The assessment, diagnosis, and plan were formulated together and I agree with the documentation in the resident's note.  The pain is very suspicious for a neuropathic etiology given that it follows a left T10 distribution and is associated with hyperesthesia.  Although there is a very small skin ulceration over the spine there are no other lesions in this distribution.  A therapeutic trial of gabapentin with titration is a reasonable therapeutic and diagnostic step.  With regards to the chronic cough, if it persists it too will also require evaluation, if not already done so.

## 2016-08-28 ENCOUNTER — Encounter: Payer: Self-pay | Admitting: *Deleted

## 2016-09-04 ENCOUNTER — Telehealth: Payer: Self-pay

## 2016-09-04 NOTE — Telephone Encounter (Signed)
Bridget Owens is a 60 y.o. female who was contacted on behalf of [facility] Geriatrics Task Force. Patient reports that she is generally doing well with accessing and taking her medications. She does report that she missed her Tribenzor and glimeperide for approximately 3-4 days last week due to issues with her pharmacy.She reports no adherence issues with any other medications Patient does not check blood pressure at home but does request assistance with getting a blood pressure cuff. She reports that her fasting BG this morning was ~120 and her PPBG was 135. She does not check her BG regularly but does report remembering her highest being 281. She could not recall when this she checked this reading or the factors that may have contributed. Patient's major complaint was pain on her back and sides that she believes to be shingles. She was initiated on a trial of Gabapentin 300mg  which she takes 2 capsules nightly with no relief. She reports that she also takes 2 tablets of tylenol, but has severe pain when bathing. Her symptoms include itching as well as "tingling, sharp, and hot" pain. Patient was advised to take diphenhydramine qhs to help with associated pain and was advised that it may make her drowsy. Patient requests an appointment to be seen for this chief complaint.

## 2016-09-05 ENCOUNTER — Encounter: Payer: Self-pay | Admitting: Internal Medicine

## 2016-09-05 ENCOUNTER — Ambulatory Visit (INDEPENDENT_AMBULATORY_CARE_PROVIDER_SITE_OTHER)
Admission: RE | Admit: 2016-09-05 | Discharge: 2016-09-05 | Disposition: A | Discharge status: Home / Self Care | Payer: Medicaid Other | Source: Ambulatory Visit | Attending: Internal Medicine | Admitting: Internal Medicine

## 2016-09-05 ENCOUNTER — Ambulatory Visit (INDEPENDENT_AMBULATORY_CARE_PROVIDER_SITE_OTHER): Payer: Medicaid Other | Admitting: Internal Medicine

## 2016-09-05 ENCOUNTER — Other Ambulatory Visit: Payer: Self-pay | Admitting: Internal Medicine

## 2016-09-05 VITALS — BP 130/82 | HR 82 | Ht 66.0 in | Wt 181.0 lb

## 2016-09-05 DIAGNOSIS — L299 Pruritus, unspecified: Secondary | ICD-10-CM

## 2016-09-05 DIAGNOSIS — E118 Type 2 diabetes mellitus with unspecified complications: Secondary | ICD-10-CM

## 2016-09-05 DIAGNOSIS — J8410 Pulmonary fibrosis, unspecified: Secondary | ICD-10-CM

## 2016-09-05 DIAGNOSIS — F17210 Nicotine dependence, cigarettes, uncomplicated: Secondary | ICD-10-CM

## 2016-09-05 NOTE — Patient Instructions (Addendum)
The key is to stop smoking completely before smoking completely stops you!   Please remember to go to the x-ray department downstairs for your tests - we will call you with the results when they are available.    Please schedule a follow up visit in 3 months but call sooner if needed with cxr and pfts on return

## 2016-09-05 NOTE — Progress Notes (Signed)
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 Pulmonary Consult    Referred by Dr. Heber [place]. Pt states she had some SOB in August 2017, it last a few days and then resolved. She states that she eventually found out she has a "spot on my lung".    has cut down on smoking and doe has improved to where = MMRC1 = can walk nl pace, flat grade, can't hurry or go uphills or steps s sob    No obvious day to day or daytime variability or assoc excess/ purulent sputum or mucus plugs or hemoptysis or cp or chest tightness, subjective wheeze or overt sinus or hb symptoms. No unusual exp hx or h/o childhood pna/ asthma or knowledge of premature birth.  Sleeping ok without nocturnal  or early am exacerbation  of respiratory  c/o's or need for noct saba. Also denies any obvious fluctuation of symptoms with weather or environmental changes or other aggravating or alleviating factors except as outlined above   Current Medications, Allergies, Complete Past Medical History, Past Surgical History, Family History, and Social History were reviewed in [employer] record.  ROS  The following are not active complaints unless bolded sore throat, dysphagia, dental problems, itching, sneezing,  nasal congestion or excess/ purulent secretions, ear ache,   fever, chills, sweats, unintended wt loss, classically pleuritic or exertional cp,  orthopnea pnd or leg swelling, presyncope, palpitations, abdominal pain, anorexia, nausea, vomiting, diarrhea  or change in bowel or bladder habits, change in stools or urine, dysuria,hematuria,  rash, arthralgias, visual complaints, headache, numbness, weakness or ataxia or problems with walking or  coordination,  change in mood/affect or memory.            Review of Systems     Objective:   Physical Exam    amb bf nad   Wt Readings from Last 3 Encounters:  09/05/16 181 lb (82.1 kg)  08/23/16 182 lb (82.6 kg)  08/06/16 183 lb 12.8 oz (83.4 kg)    Vital signs reviewed  - Note on arrival 02 sats  97% on RA    HEENT: nl dentition, turbinates, and oropharynx. Nl external ear canals without cough reflex   NECK :  without JVD/Nodes/TM/ nl carotid upstrokes bilaterally   LUNGS: no acc muscle use,  Nl contour chest which is clear to A and P bilaterally without cough on insp or exp maneuvers   CV:  RRR  no s3 or murmur or increase in P2, nad no edema   ABD:  soft and nontender with nl inspiratory excursion in the supine position. No bruits or organomegaly appreciated, bowel sounds nl  MS:  Nl gait/ ext warm without deformities, calf tenderness, cyanosis or clubbing No obvious joint restrictions   SKIN: warm and dry without lesions    NEURO:  alert, approp, nl sensorium with  no motor or cerebellar deficits apparent.    CXR PA and Lateral:   09/05/2016 :    I personally reviewed images and agree with radiology impression as follows:  Abnormal interstitial lung markings, probably similar to the previous CT. No consolidation or collapse.  My impression: very minimally changed  since 06/03/15    Assessment:

## 2016-09-05 NOTE — Assessment & Plan Note (Signed)

## 2016-09-06 NOTE — Assessment & Plan Note (Signed)
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   In active  smokers Langerhan's Cell  Histiocyctosis (eosinophilic granuomatosis),  DIP,  and Respiratory Bronchiolitis ILD also need to be considered,  And this fits best with DIP  DIP can respond to steroids but the degree of response is highly varialbe and since she says she's already better since cut down on cigs and this is the main rx for DIP,  I strongly recommend this first and return in 3 months for f/u pfts.  Total time devoted to counseling  > 50 % of 60 min office visit:  review case with pt/ discussion of options/alternatives/ personally creating written customized instructions  in presence of pt  then going over those specific  Instructions directly with the pt including how to use all of the meds but in particular covering each new medication in detail and the difference between the maintenance/automatic meds and the prns using an action plan format for the latter.  Please see AVS from this visit for a full list of these instructions

## 2016-09-06 NOTE — Progress Notes (Signed)
Spoke with pt and notified of results per Dr. Wert. Pt verbalized understanding and denied any questions. 

## 2016-09-18 ENCOUNTER — Other Ambulatory Visit: Payer: Self-pay | Admitting: Pharmacist

## 2016-09-18 DIAGNOSIS — E785 Hyperlipidemia, unspecified: Secondary | ICD-10-CM

## 2016-09-18 DIAGNOSIS — E1142 Type 2 diabetes mellitus with diabetic polyneuropathy: Secondary | ICD-10-CM

## 2016-09-18 NOTE — Telephone Encounter (Signed)
Patient was contacted with Frank Tillman, PharmD candidate. I agree with the assessment and plan of care documented.  

## 2016-09-19 MED ORDER — ATORVASTATIN CALCIUM 40 MG PO TABS: ORAL_TABLET | ORAL | 3 refills | Status: DC

## 2016-09-19 MED ORDER — METFORMIN HCL 1000 MG PO TABS: ORAL_TABLET | ORAL | 1 refills | Status: DC

## 2016-11-04 NOTE — Assessment & Plan Note (Addendum)
Lab Results  Component Value Date   HGBA1C 8.7 11/05/2016   HGBA1C 8.9 07/27/2016   HGBA1C 8.2 03/27/2016    Recent Labs  11/05/16 1445  GLUCAP 258*    Glimepride increased from 2 mg to 4 mg daily at last visit.  Reports not missing doses.  Current medications: metformin 1000 mg BID, glimepride 4 mg daily Current insulin: none  Assessment HgbA1c goal: 7.0 Glycemic control: uncontrolled Complications: peripheral neuropathy  Plan Medications: Add Byetta 5 mcg BID, continue metformin 1000 mg BID, glimepride 4 mg daily with largest meal Insulin: none Other: -A1c today -encourage diet, exercise, and weight loss

## 2016-11-04 NOTE — Progress Notes (Signed)
   CC: vaginal itching  HPI:  [name] is a 61 y.o.  woman with history of HTN, DM2, and tobacco abuse who presents for follow-up of diabetes.  Please see A&P for status of the patient's chronic medical conditions.   Past Medical History:  Diagnosis Date  . Adenomatous colon polyp 6/09   Resected on colonoscopy, no high grade dysplasia.   . Allergic rhinitis   . Bell's palsy 01/2009   Left sided  . Chest pain    Exercise stress test negative 6/07  . Colon polyps    03/22/2008: adenomatous polyps x3 w no dysplasia. Needs repeat colonoscopy in 3 years  . Diabetic peripheral neuropathy ([place])   . External hemorrhoid   . GERD (gastroesophageal reflux disease)   . Hyperlipidemia   . Hypertension   . Hypertensive retinopathy    Followed by Dr. Herbert Deaner.  . Insomnia   . Intolerance, drug    Leg cramps on Lipitor  . Lipoma 12/11   Posterior neck.   . Osteoarthritis    Carpometacarpal joint of right thumb  . Postmenopausal bleeding 9/05   Endometrial biopsy showed  FOCAL TUBAL METAPLASIA   . Postmenopausal symptoms    Hot flashes, vaginal dryness, iritability, difficulty sleeping. as of 2005.  Marland Kitchen Sebaceous cyst of breast    Has been refered to derm.  . Trigger finger    Right thumb  . Type II diabetes mellitus ([place])   . Uterine fibroid     Review of Systems:   Review of Systems  Constitutional: Negative for chills and fever.  Respiratory: Positive for cough and sputum production. Negative for shortness of breath.   Cardiovascular: Negative for chest pain and palpitations.  Genitourinary:       Vaginal itching    Physical Exam:  Vitals:   11/05/16 1425  BP: 135/69  Pulse: 92  Temp: 97.9 F (36.6 C)  TempSrc: Oral  SpO2: 100%  Weight: 181 lb 8 oz (82.3 kg)   Physical Exam  Constitutional: She is oriented to person, place, and time. She appears well-developed and well-nourished. No distress.  Cardiovascular: Normal rate and regular rhythm.   Bilateral DP  pulse intact  Pulmonary/Chest: Effort normal and breath sounds normal.  Genitourinary:  Genitourinary Comments: Homogenous, thin, white vaginal discharge covering vaginal walls on speculum exam No erythema or erosions No odor  Neurological: She is alert and oriented to person, place, and time.  Skin:  Feet with mild callus over head of 5th metatarsal bilaterally Warm, dry, no wounds  Psychiatric: She has a normal mood and affect. Her behavior is normal.     Assessment & Plan:   See Encounters Tab for problem based charting.  Patient discussed with Dr. Dareen Piano

## 2016-11-04 NOTE — Assessment & Plan Note (Addendum)
Seen by pulmonologist Christinia Gully.  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    -tobacco cessation -follow-up Dr Melvyn Novas and repeat PFTs

## 2016-11-04 NOTE — Assessment & Plan Note (Addendum)
BP Readings from Last 3 Encounters:  11/05/16 135/69  09/05/16 130/82  08/23/16 (!) 170/78   Lab Results  Component Value Date   CREATININE 0.83 08/06/2016   Lab Results  Component Value Date   K 4.2 08/06/2016    Current medications: olmesartan-amlodipine-HCTZ 40-5-25 mg daily  Assessment BP goal: 140/90 BP control: well controlled  Plan Medications: continue olmesartan-amlodipine-HCTZ 40-5-25 mg daily Other: -encouraged exercise and weight loss

## 2016-11-04 NOTE — Assessment & Plan Note (Addendum)
Current smoking: 1/2 - 1 pack per day  Reports strong cravings, but knows she has to quit.  Knows people who have taken "the smoking pill", presumably Chantix, and "freaked out", and she is not interested in it.  Has previusly used nicotine patch and is will to try again.  A/P -Counseled regarding tobacco cessation -Provided with 1-800-QUIT-NOW [phone]) -Nicotine patches and lozenges

## 2016-11-05 ENCOUNTER — Other Ambulatory Visit (HOSPITAL_COMMUNITY)
Admission: RE | Admit: 2016-11-05 | Discharge: 2016-11-05 | Disposition: A | Discharge status: Home / Self Care | Payer: Medicaid Other | Source: Ambulatory Visit | Attending: Internal Medicine | Admitting: Internal Medicine

## 2016-11-05 ENCOUNTER — Ambulatory Visit (INDEPENDENT_AMBULATORY_CARE_PROVIDER_SITE_OTHER): Payer: Medicaid Other | Admitting: Internal Medicine

## 2016-11-05 ENCOUNTER — Encounter: Payer: Self-pay | Admitting: Internal Medicine

## 2016-11-05 VITALS — BP 135/69 | HR 92 | Temp 97.9°F | Wt 181.5 lb

## 2016-11-05 DIAGNOSIS — E1142 Type 2 diabetes mellitus with diabetic polyneuropathy: Secondary | ICD-10-CM | POA: Diagnosis not present

## 2016-11-05 DIAGNOSIS — B3731 Acute candidiasis of vulva and vagina: Secondary | ICD-10-CM

## 2016-11-05 DIAGNOSIS — I10 Essential (primary) hypertension: Secondary | ICD-10-CM

## 2016-11-05 DIAGNOSIS — Z716 Tobacco abuse counseling: Secondary | ICD-10-CM

## 2016-11-05 DIAGNOSIS — J8410 Pulmonary fibrosis, unspecified: Secondary | ICD-10-CM

## 2016-11-05 DIAGNOSIS — N898 Other specified noninflammatory disorders of vagina: Secondary | ICD-10-CM | POA: Diagnosis not present

## 2016-11-05 DIAGNOSIS — B373 Candidiasis of vulva and vagina: Secondary | ICD-10-CM | POA: Insufficient documentation

## 2016-11-05 DIAGNOSIS — Z7984 Long term (current) use of oral hypoglycemic drugs: Secondary | ICD-10-CM | POA: Diagnosis not present

## 2016-11-05 DIAGNOSIS — F17210 Nicotine dependence, cigarettes, uncomplicated: Secondary | ICD-10-CM

## 2016-11-05 DIAGNOSIS — L84 Corns and callosities: Secondary | ICD-10-CM

## 2016-11-05 DIAGNOSIS — Z79899 Other long term (current) drug therapy: Secondary | ICD-10-CM

## 2016-11-05 LAB — GLUCOSE, CAPILLARY: Glucose-Capillary: 258 mg/dL — ABNORMAL HIGH (ref 65–99)

## 2016-11-05 LAB — POCT GLYCOSYLATED HEMOGLOBIN (HGB A1C): Hemoglobin A1C: 8.7

## 2016-11-05 MED ORDER — FLUCONAZOLE 150 MG PO TABS: 150.0000 mg | ORAL_TABLET | Freq: Once | ORAL | 0 refills | Status: AC

## 2016-11-05 MED ORDER — NICOTINE POLACRILEX 2 MG MT LOZG: 2.0000 mg | LOZENGE | OROMUCOSAL | 0 refills | Status: DC | PRN

## 2016-11-05 MED ORDER — EXENATIDE 5 MCG/0.02ML ~~LOC~~ SOPN: 5.0000 ug | PEN_INJECTOR | Freq: Two times a day (BID) | SUBCUTANEOUS | 11 refills | Status: DC

## 2016-11-05 MED ORDER — NICOTINE 14 MG/24HR TD PT24: 14.0000 mg | MEDICATED_PATCH | TRANSDERMAL | 0 refills | Status: DC

## 2016-11-05 NOTE — Patient Instructions (Addendum)
I have given you a prescription for a new medicine, Byetta, for diabetes.  This is an injection into the skin that you will give yourself twice per day to control the diabetes.  For the vaginal itching, I have given you a prescription for one pill to take one time to treat yeast.  Keep trying to quit smoking.  I have prescribed a nicotine patch for you to wear daily, and also some nicotine lozenges that you can suck on when you have a craving.  Please call 1-800-QUIT-NOW and talk to them about quitting.

## 2016-11-05 NOTE — Assessment & Plan Note (Addendum)
Vaginal itching for the past 5 days, has not noticed discharge or odor.  Thin, homogenous, odorless discharge on speculum exam.  Not sexually active for past 5 years.  Most likely candidiasis, possibly BV or trichomonas.  -150 mg PO fluconazole once -wet prep -pap

## 2016-11-06 LAB — HIV ANTIBODY (ROUTINE TESTING W REFLEX): HIV Screen 4th Generation wRfx: NONREACTIVE

## 2016-11-07 LAB — CYTOLOGY - PAP: Diagnosis: NEGATIVE

## 2016-11-07 LAB — CERVICOVAGINAL ANCILLARY ONLY: Wet Prep (BD Affirm): NEGATIVE

## 2016-11-07 NOTE — Progress Notes (Signed)
Internal Medicine Clinic Attending  Case discussed with Dr. O'Sullivan at the time of the visit.  We reviewed the resident's history and exam and pertinent patient test results.  I agree with the assessment, diagnosis, and plan of care documented in the resident's note. 

## 2016-11-09 ENCOUNTER — Telehealth: Payer: Self-pay | Admitting: Dietician

## 2016-11-09 DIAGNOSIS — E1142 Type 2 diabetes mellitus with diabetic polyneuropathy: Secondary | ICD-10-CM

## 2016-11-09 MED ORDER — INSULIN PEN NEEDLE 31G X 5 MM MISC: 12 refills | Status: DC

## 2016-11-09 NOTE — Telephone Encounter (Signed)
Bridget Owens calls and says nobody taught her her how to use the new injection medicine. She doesn't understand why she needs it when her sugars are always low. I informed her that her A1C is higher than it is supposed to be and that is why her doctor ordered it. She asked if she should take it at night. I explained that it should be taken before breakfast and her evening meal.    She cannot get transportion to our office today. She also needs a prescription for pen needles. This is requested in this note. She agreed to go to [facility] and ask the pharmacist there to show her how to use,

## 2016-11-09 NOTE — Telephone Encounter (Signed)
Called [facility] and asked them to instruct Bridget Owens on how to use her Byetta pen. They agreed to do so.

## 2016-11-12 ENCOUNTER — Encounter: Payer: Self-pay | Admitting: Internal Medicine

## 2016-11-15 ENCOUNTER — Telehealth: Payer: Self-pay | Admitting: *Deleted

## 2016-11-15 NOTE — Telephone Encounter (Signed)
Call to patient to follow up through Geriatric task Force.  Patient states she is continuing to take her meds as ordered for Blood pressure and Diabetes.  Has problems with giving herself the Byetta.  Stated that she does not know how to give her self the medication and is afraid to do the injection.  Patient stated when asked that the Pharmacy did not help her.  Butch Penny Plyler the Diabetes Educator has agreed to have patient come in for an appointment for teaching administration of the medication.  Patient is scheduled to come in on Monday.  When asked if she had aany othe problems -patient stated that she has a rash on her leg that has been bothering her for a few days.  Has applied ointment with no relief.  Unable to come in on tomorrow for an appointment.  Patient scheduled to come in on Monday for the rash as well. Sander Nephew, RN 11/15/2016 4:23 PM

## 2016-11-19 ENCOUNTER — Ambulatory Visit: Payer: Medicaid Other

## 2016-11-19 ENCOUNTER — Ambulatory Visit: Payer: Medicaid Other | Admitting: Dietician

## 2016-11-23 ENCOUNTER — Encounter: Payer: Self-pay | Admitting: Internal Medicine

## 2016-11-23 ENCOUNTER — Ambulatory Visit: Payer: Medicaid Other | Admitting: Dietician

## 2016-11-23 ENCOUNTER — Ambulatory Visit: Payer: Medicaid Other

## 2016-11-23 ENCOUNTER — Encounter: Payer: Self-pay | Admitting: *Deleted

## 2016-11-23 ENCOUNTER — Telehealth: Payer: Self-pay | Admitting: *Deleted

## 2016-11-23 NOTE — Telephone Encounter (Signed)
PATIENT CONTACTED WITH FOOT APPOINTMENT MARCH 7.018 @ 3:15PM  / [place] MAILED.

## 2016-11-23 NOTE — Telephone Encounter (Signed)
Called patient to reschedule missed appointment this morning for Byetta teaching as patient has not started it yet per geriatric task force call.

## 2016-12-06 ENCOUNTER — Ambulatory Visit: Payer: Medicaid Other | Admitting: Internal Medicine

## 2016-12-10 ENCOUNTER — Other Ambulatory Visit: Payer: Self-pay

## 2016-12-10 DIAGNOSIS — I10 Essential (primary) hypertension: Secondary | ICD-10-CM

## 2016-12-10 NOTE — Telephone Encounter (Signed)
Olmesartan-Amlodipine-HCTZ 40-5-25 MG TABS, refill request @ [facility] on [address].

## 2016-12-11 ENCOUNTER — Other Ambulatory Visit: Payer: Self-pay

## 2016-12-11 MED ORDER — OLMESARTAN-AMLODIPINE-HCTZ 40-5-25 MG PO TABS: 1.0000 | ORAL_TABLET | Freq: Every day | ORAL | 3 refills | Status: DC

## 2016-12-11 NOTE — Telephone Encounter (Signed)
Unable to get med at the pharmacy. Please call pt back.

## 2016-12-11 NOTE — Telephone Encounter (Signed)
Needs PA for olmesartan/amlodipine/hctz, sending to [facility].

## 2016-12-12 ENCOUNTER — Ambulatory Visit: Payer: Medicaid Other | Admitting: Podiatry

## 2016-12-12 ENCOUNTER — Telehealth: Payer: Self-pay | Admitting: *Deleted

## 2016-12-12 NOTE — Telephone Encounter (Addendum)
Spoke to patient needs a PA for her blood pressure medication- Olmesartan/Amlodipine/HCTZ.  Call to [place] [facility].  Information was provided for PA review.  PA # [id number] will need to recheck for decision in 24 hours.  Sander Nephew, RN 12/12/2016 10:53 AM  Prior Approval for Olmesartan/Amolodipine/HCTZ staring 12/12/2016 thru 12/07/2017.  Sander Nephew, RN 12/14/2016 10:51 AM.

## 2016-12-27 ENCOUNTER — Telehealth: Payer: Self-pay

## 2016-12-27 NOTE — Telephone Encounter (Signed)
Bridget Owens is a 61 y.o. female who was contacted on behalf of [facility] Geriatrics Task Force. I was calling to follow-up on a phone call made on 11/13/16 when the patient stated she was unable to use her Byetta pen. I called to see if she has since been educated on how to use the pen and she said she has not. The patient was unable to come into clinic today, so I offered to schedule her but she does not know when she can make it. I told her the pharmacy schedule was pretty open tomorrow, so she is more than welcome to come so I can teach her. She says she will call back when she knows she can make it, but I will call again next week to discuss more options with her. I am also interested to check on if Bydureon is covered by her insurance so that she could get that instead as she is afraid of injections. Patient has not been using the Byetta at all because she does not know how to use it!! She does not have other medication questions at this time.

## 2016-12-28 NOTE — Telephone Encounter (Signed)
Great catch, Arby Barrette! Can you call the pharmacy to see if she is due for a refill? If so, you might be able to ask that they put a "counsel patient" note on the bag just in case we have a tough time getting her into clinic.  Also, she may be due for refill on her other meds (atorva, glimepiride, metformin, olmesartan combo)  The byetta is definitely significant  Thank you!

## 2016-12-28 NOTE — Telephone Encounter (Signed)
Hi! Can you please help to try to schedule patient for pharmacy clinic visit?  Thank you for any help you can provide

## 2016-12-29 ENCOUNTER — Emergency Department (HOSPITAL_COMMUNITY)
Admission: EM | Admit: 2016-12-29 | Discharge: 2016-12-29 | Disposition: A | Discharge status: Home / Self Care | Payer: Medicaid Other | Attending: Emergency Medicine | Admitting: Emergency Medicine

## 2016-12-29 ENCOUNTER — Emergency Department (HOSPITAL_COMMUNITY): Payer: Medicaid Other

## 2016-12-29 ENCOUNTER — Encounter (HOSPITAL_COMMUNITY): Payer: Self-pay

## 2016-12-29 DIAGNOSIS — J069 Acute upper respiratory infection, unspecified: Secondary | ICD-10-CM

## 2016-12-29 DIAGNOSIS — E119 Type 2 diabetes mellitus without complications: Secondary | ICD-10-CM | POA: Insufficient documentation

## 2016-12-29 DIAGNOSIS — I10 Essential (primary) hypertension: Secondary | ICD-10-CM | POA: Insufficient documentation

## 2016-12-29 DIAGNOSIS — F17210 Nicotine dependence, cigarettes, uncomplicated: Secondary | ICD-10-CM | POA: Diagnosis not present

## 2016-12-29 DIAGNOSIS — Z794 Long term (current) use of insulin: Secondary | ICD-10-CM | POA: Diagnosis not present

## 2016-12-29 DIAGNOSIS — Z7982 Long term (current) use of aspirin: Secondary | ICD-10-CM | POA: Diagnosis not present

## 2016-12-29 DIAGNOSIS — R05 Cough: Secondary | ICD-10-CM | POA: Diagnosis present

## 2016-12-29 MED ORDER — GUAIFENESIN ER 600 MG PO TB12: 600.0000 mg | ORAL_TABLET | Freq: Two times a day (BID) | ORAL | 0 refills | Status: DC

## 2016-12-29 MED ORDER — FLUTICASONE PROPIONATE 50 MCG/ACT NA SUSP: 1.0000 | Freq: Every day | NASAL | 0 refills | Status: DC

## 2016-12-29 NOTE — ED Provider Notes (Signed)
[facility] DEPT Provider Note   CSN: [id number] Arrival date & time: 12/29/16  1850   By signing my name below, I, Bridget Owens, attest that this documentation has been prepared under the direction and in the presence of Shary Decamp, PA-C. Electronically Signed: Eunice Owens, Scribe. 12/29/16. 7:56 PM.   History   Chief Complaint Chief Complaint  Patient presents with  . URI   The history is provided by the patient and medical records. No language interpreter was used.    HPI Comments: Bridget Owens is a 61 y.o. female who presents to the Emergency Department complaining of generalized body aches > in the chest x 5 days. Pt states a dog sneezed in face. She reports associated ear ache, rhinorrhea, abdominal pain, diarrhea, dry cough and enuresis. She states she has taken robitussin without relief. Pt states she is a smoker. She denies vomit and fever.  Past Medical History:  Diagnosis Date  . Adenomatous colon polyp 6/09   Resected on colonoscopy, no high grade dysplasia.   . Allergic rhinitis   . Bell's palsy 01/2009   Left sided  . Chest pain    Exercise stress test negative 6/07  . Colon polyps    03/22/2008: adenomatous polyps x3 w no dysplasia. Needs repeat colonoscopy in 3 years  . Diabetic peripheral neuropathy ([place])   . External hemorrhoid   . GERD (gastroesophageal reflux disease)   . Hyperlipidemia   . Hypertension   . Hypertensive retinopathy    Followed by Dr. Herbert Deaner.  . Insomnia   . Intolerance, drug    Leg cramps on Lipitor  . Lipoma 12/11   Posterior neck.   . Osteoarthritis    Carpometacarpal joint of right thumb  . Postmenopausal bleeding 9/05   Endometrial biopsy showed  FOCAL TUBAL METAPLASIA   . Postmenopausal symptoms    Hot flashes, vaginal dryness, iritability, difficulty sleeping. as of 2005.  Marland Kitchen Sebaceous cyst of breast    Has been refered to derm.  . Trigger finger    Right thumb  . Type II diabetes mellitus ([place])   . Uterine  fibroid     Patient Active Problem List   Diagnosis Date Noted  . Pulmonary fibrosis, unspecified ([place]) 09/05/2016  . Burning chest pain 05/22/2016  . Pruritus 01/09/2016  . Mixed incontinence urge and stress 12/21/2014  . Neuropathy, lower extremity 12/21/2014  . Seasonal allergies 10/06/2014  . Vaginal candidiasis 01/28/2014  . Cigarette smoker 04/03/2013  . Health care maintenance 02/03/2013  . Personal history of colonic polyps 12/22/2012  . 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 Hemorrhoids 02/07/2012  . GERD (gastroesophageal reflux disease) 08/13/2011  . TRIGGER FINGER, RIGHT THUMB 12/22/2009  . Type 2 diabetes mellitus with peripheral neuropathy ([place]) 11/11/2006  . Hyperlipidemia 11/11/2006  . Hypertensive retinopathy 11/11/2006  . Essential hypertension 11/11/2006    Past Surgical History:  Procedure Laterality Date  . TUBAL LIGATION      OB History    No data available       Home Medications    Prior to Admission medications   Medication Sig Start Date End Date Taking? Authorizing Provider  ACCU-CHEK SMARTVIEW test strip Use to check sugar once daily as directed. Diag code E11.42. Non insulin dependent 09/06/16   Minus Liberty, MD  albuterol (PROVENTIL HFA;VENTOLIN HFA) 108 (90 Base) MCG/ACT inhaler Inhale 1-2 puffs into the lungs every 6 (six) hours as needed for wheezing or shortness of breath. 07/27/16   Minus Liberty, MD  aspirin 81 MG EC tablet Take 81 mg by mouth daily.      Historical Provider, MD  atorvastatin (LIPITOR) 40 MG tablet TAKE 1 TABLET(40 MG) BY MOUTH DAILY 09/19/16   Minus Liberty, MD  diphenhydrAMINE (BENADRYL) 25 mg capsule Take 1 capsule (25 mg total) by mouth every 6 (six) hours as needed. 01/09/16 01/08/17  Norman Herrlich, MD  exenatide (BYETTA 5 MCG PEN) 5 MCG/0.02ML SOPN injection Inject 0.02 mLs (5 mcg total) into the skin 2 (two) times daily with a meal. 11/05/16 11/05/17  Minus Liberty, MD  glimepiride (AMARYL) 4  MG tablet Take 1 tablet (4 mg total) by mouth daily with breakfast. Take at breakfast OR before your largest meal of the day. 08/06/16   Minus Liberty, MD  glucose blood (ACCU-CHEK SMARTVIEW) test strip Use to check blood sugar twice daily. ICD10: E11.9 08/19/15   Corky Sox, MD  Insulin Pen Needle 31G X 5 MM MISC Use to inject Byetta two times a day before breakfast and before your evening meal 11/09/16   Minus Liberty, MD  latanoprost (XALATAN) 0.005 % ophthalmic solution Place 1 drop into both eyes at bedtime. 03/05/15   Historical Provider, MD  metFORMIN (GLUCOPHAGE) 1000 MG tablet TAKE 1 TABLET(1000 MG) BY MOUTH TWICE DAILY WITH A MEAL 09/19/16   Minus Liberty, MD  nicotine (NICODERM CQ - DOSED IN MG/24 HOURS) 14 mg/24hr patch Place 1 patch (14 mg total) onto the skin daily. 11/05/16 11/05/17  Minus Liberty, MD  nicotine polacrilex (NICOTINE MINI) 2 MG lozenge Take 1 lozenge (2 mg total) by mouth as needed for smoking cessation. 11/05/16   Minus Liberty, MD  Olmesartan-Amlodipine-HCTZ 40-5-25 MG TABS Take 1 tablet by mouth daily. for blood pressure 12/11/16   Oval Linsey, MD    Family History Family History  Problem Relation Age of Onset  . Pneumonia Mother   . Diabetes Mother   . Early death Father   . Diabetes Sister   . Diabetes Brother   . Diabetes Maternal Grandmother     Social History Social History  Substance Use Topics  . Smoking status: Current Some Day Smoker    Packs/day: 0.50    Years: 30.00    Types: Cigarettes  . Smokeless tobacco: Former Systems developer    Quit date: 11/28/2010     Comment: trying to quit.  Smoking more. 1/2PPD  . Alcohol use No     Allergies   Codeine   Review of Systems Review of Systems  Constitutional: Negative for fever.  HENT: Positive for congestion, ear pain, rhinorrhea and sneezing.   Respiratory: Positive for cough.   Gastrointestinal: Positive for diarrhea. Negative for vomiting.  Genitourinary: Positive for  enuresis (with sneezing).  Musculoskeletal: Positive for arthralgias and myalgias.   Physical Exam Updated Vital Signs BP 121/68 (BP Location: Left Arm)   Pulse (!) 106   Temp 99.4 F (37.4 C) (Oral)   Resp 20   SpO2 95%   Physical Exam  Constitutional: She is oriented to person, place, and time. She appears well-developed and well-nourished. No distress.  HENT:  Head: Normocephalic and atraumatic.  Right Ear: Tympanic membrane, external ear and ear canal normal.  Left Ear: Tympanic membrane, external ear and ear canal normal.  Nose: Nose normal.  Mouth/Throat: Uvula is midline, oropharynx is clear and moist and mucous membranes are normal. No trismus in the jaw. No oropharyngeal exudate, posterior oropharyngeal erythema or tonsillar abscesses.  Eyes: Conjunctivae and EOM are normal. Pupils are equal,  round, and reactive to light. Right eye exhibits no discharge. Left eye exhibits no discharge. No scleral icterus.  Neck: Normal range of motion. Neck supple. No JVD present. No tracheal deviation present.  Cardiovascular: Normal rate, regular rhythm, S1 normal, S2 normal, normal heart sounds, intact distal pulses and normal pulses.   Pulmonary/Chest: Effort normal and breath sounds normal. No stridor. No respiratory distress. She has no decreased breath sounds. She has no wheezes. She has no rhonchi. She has no rales.  Abdominal: Normal appearance and bowel sounds are normal. There is no tenderness.  Musculoskeletal: Normal range of motion.  Neurological: She is alert and oriented to person, place, and time. Coordination normal.  Skin: Skin is warm and dry.  Psychiatric: She has a normal mood and affect. Her speech is normal and behavior is normal. Judgment and thought content normal.  Nursing note and vitals reviewed.  ED Treatments / Results  DIAGNOSTIC STUDIES: Oxygen Saturation is 95% on RA, adequate by my interpretation.    COORDINATION OF CARE: 7:47 PM Discussed treatment plan  with pt at bedside and pt agreed to plan. Will order imaging and medications. Pt advised to F/U with PCP.  Labs (all labs ordered are listed, but only abnormal results are displayed) Labs Reviewed - No data to display  EKG  EKG Interpretation None       Radiology Dg Chest 2 View  Result Date: 12/29/2016 CLINICAL DATA:  Acute onset of cough and runny nose. Body aches and fever. EXAM: CHEST  2 VIEW COMPARISON:  Chest radiograph performed 09/05/2016 FINDINGS: The lungs are well-aerated. Mild peribronchial thickening is noted. There is no evidence of focal opacification, pleural effusion or pneumothorax. The heart is normal in size; the mediastinal contour is within normal limits. No acute osseous abnormalities are seen. IMPRESSION: Mild peribronchial thickening noted.  Lungs otherwise grossly clear. Electronically Signed   By: Garald Balding M.D.   On: 12/29/2016 20:12    Procedures Procedures (including critical care time)  Medications Ordered in ED Medications - No data to display   Initial Impression / Assessment and Plan / ED Course  I have reviewed the triage vital signs and the nursing notes.  Pertinent labs & imaging results that were available during my care of the patient were reviewed by me and considered in my medical decision making (see chart for details).  Final Clinical Impressions(s) / ED Diagnoses   {I have reviewed and evaluated the relevant imaging studies.  {I have reviewed the relevant previous healthcare records.  {I obtained HPI from historian.   ED Course:  Assessment: Pt is a 61 y.o. female presents with URI symptoms x several days . On exam, pt in NAD. VSS. Afebrile. Lungs CTA, Heart RRR. Abdomen nontender/soft. Pt CXR negative for acute infiltrate. Patients symptoms are consistent with URI, likely viral etiology. Discussed that antibiotics are not indicated for viral infections. Pt will be discharged with symptomatic treatment.  Verbalizes understanding and  is agreeable with plan. Pt is hemodynamically stable & in NAD prior to dc.   Disposition/Plan:  DC Home Additional Verbal discharge instructions given and discussed with patient.  Pt Instructed to f/u with PCP in the next week for evaluation and treatment of symptoms. Return precautions given Pt acknowledges and agrees with plan  Supervising Physician Nat Christen, MD  Final diagnoses:  Upper respiratory tract infection, unspecified type    New Prescriptions New Prescriptions   No medications on file  I personally performed the services described in this  documentation, which was scribed in my presence. The recorded information has been reviewed and is accurate.    Shary Decamp, PA-C 12/29/16 2040    Nat Christen, MD 12/30/16 [phone]

## 2016-12-29 NOTE — ED Notes (Signed)
Pt stable, ambulatory, states understanding of discharge instructions 

## 2016-12-29 NOTE — ED Triage Notes (Signed)
Onset  4 days ago runny nose,  Cough, body aches, fever.  No respiratory or swallowing difficulties.

## 2016-12-29 NOTE — Discharge Instructions (Signed)
Please read and follow all provided instructions.  Your diagnoses today include:  1. Upper respiratory tract infection, unspecified type     You appear to have an upper respiratory infection (URI). An upper respiratory tract infection, or cold, is a viral infection of the air passages leading to the lungs. It should improve gradually after 5-7 days. You may have a lingering cough that lasts for 2- 4 weeks after the infection.  Tests performed today include: Vital signs. See below for your results today.   Medications prescribed:   Take any prescribed medications only as directed. Treatment for your infection is aimed at treating the symptoms. There are no medications, such as antibiotics, that will cure your infection.   Home care instructions:  Follow any educational materials contained in this packet.   Your illness is contagious and can be spread to others, especially during the first 3 or 4 days. It cannot be cured by antibiotics or other medicines. Take basic precautions such as washing your hands often, covering your mouth when you cough or sneeze, and avoiding public places where you could spread your illness to others.   Please continue drinking plenty of fluids.  Use over-the-counter medicines as needed as directed on packaging for symptom relief.  You may also use ibuprofen or tylenol as directed on packaging for pain or fever.  Do not take multiple medicines containing Tylenol or acetaminophen to avoid taking too much of this medication.  Follow-up instructions: Please follow-up with your primary care provider in the next 3 days for further evaluation of your symptoms if you are not feeling better.   Return instructions:  Please return to the Emergency Department if you experience worsening symptoms.  RETURN IMMEDIATELY IF you develop shortness of breath, confusion or altered mental status, a new rash, become dizzy, faint, or poorly responsive, or are unable to be cared for at  home. Please return if you have persistent vomiting and cannot keep down fluids or develop a fever that is not controlled by tylenol or motrin.   Please return if you have any other emergent concerns.  Additional Information:  Your vital signs today were: BP 121/68 (BP Location: Left Arm)    Pulse (!) 106    Temp 99.4 F (37.4 C) (Oral)    Resp 20    SpO2 95%  If your blood pressure (BP) was elevated above 135/85 this visit, please have this repeated by your doctor within one month. --------------

## 2017-01-14 ENCOUNTER — Ambulatory Visit: Payer: Medicaid Other | Admitting: Pharmacist

## 2017-01-18 ENCOUNTER — Other Ambulatory Visit: Payer: Self-pay | Admitting: Internal Medicine

## 2017-01-18 DIAGNOSIS — K219 Gastro-esophageal reflux disease without esophagitis: Secondary | ICD-10-CM

## 2017-01-22 ENCOUNTER — Encounter: Payer: Self-pay | Admitting: Internal Medicine

## 2017-01-22 ENCOUNTER — Ambulatory Visit (INDEPENDENT_AMBULATORY_CARE_PROVIDER_SITE_OTHER): Payer: Medicaid Other | Admitting: Internal Medicine

## 2017-01-22 ENCOUNTER — Ambulatory Visit (INDEPENDENT_AMBULATORY_CARE_PROVIDER_SITE_OTHER)
Admission: RE | Admit: 2017-01-22 | Discharge: 2017-01-22 | Disposition: A | Discharge status: Home / Self Care | Payer: Medicaid Other | Source: Ambulatory Visit | Attending: Internal Medicine | Admitting: Internal Medicine

## 2017-01-22 VITALS — BP 126/74 | HR 84 | Ht 65.0 in | Wt 177.0 lb

## 2017-01-22 DIAGNOSIS — F17210 Nicotine dependence, cigarettes, uncomplicated: Secondary | ICD-10-CM | POA: Diagnosis not present

## 2017-01-22 DIAGNOSIS — J8410 Pulmonary fibrosis, unspecified: Secondary | ICD-10-CM | POA: Diagnosis not present

## 2017-01-22 LAB — PULMONARY FUNCTION TEST
DL/VA % pred: 86 %
DL/VA: 4.24 ml/min/mmHg/L
DLCO cor % pred: 43 %
DLCO cor: 11.16 ml/min/mmHg
DLCO unc % pred: 46 %
DLCO unc: 11.85 ml/min/mmHg
FEF 25-75 Post: 3.04 L/sec
FEF 25-75 Pre: 1.55 L/sec
FEF2575-%Change-Post: 95 %
FEF2575-%Pred-Post: 149 %
FEF2575-%Pred-Pre: 76 %
FEV1-%Change-Post: 17 %
FEV1-%Pred-Post: 92 %
FEV1-%Pred-Pre: 78 %
FEV1-Post: 1.96 L
FEV1-Pre: 1.66 L
FEV1FVC-%Change-Post: 8 %
FEV1FVC-%Pred-Pre: 100 %
FEV6-%Change-Post: 8 %
FEV6-%Pred-Post: 87 %
FEV6-%Pred-Pre: 80 %
FEV6-Post: 2.27 L
FEV6-Pre: 2.1 L
FEV6FVC-%Pred-Post: 103 %
FEV6FVC-%Pred-Pre: 103 %
FVC-%Change-Post: 8 %
FVC-%Pred-Post: 84 %
FVC-%Pred-Pre: 77 %
FVC-Post: 2.27 L
FVC-Pre: 2.1 L
Post FEV1/FVC ratio: 86 %
Post FEV6/FVC ratio: 100 %
Pre FEV1/FVC ratio: 79 %
Pre FEV6/FVC Ratio: 100 %
RV % pred: 118 %
RV: 2.5 L
TLC % pred: 82 %
TLC: 4.29 L

## 2017-01-22 NOTE — Patient Instructions (Addendum)
Please remember to go to the   x-ray department downstairs in the basement  for your tests - we will call you with the results when they are available.  The key is to stop smoking completely before smoking completely stops you - it's not too late!!!  Pulmonary follow up is as needed for worse breathing or more need for your albuterol inhaler

## 2017-01-22 NOTE — Progress Notes (Signed)
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 Pulmonary Consult    Referred by Dr. Heber Meigs. Pt states she had some SOB in August 2017, it last a few days and then resolved. She states that she eventually found out she has a "spot on my lung".    has cut down on smoking and doe has improved to where = MMRC1 = can walk nl pace, flat grade, can't hurry or go uphills or steps s sob rec The key is to stop smoking completely before smoking completely stops you!        01/22/2017  f/u ov/[facility] re: ? DIP / has saba but not using / cutting down on smoking Chief Complaint  Patient presents with  . Follow-up    PFT's done today. Breathing is doing well and no new co's.      Not limited by breathing from desired activities  And no need for saba  No obvious day to day or daytime variability or assoc excess/ purulent sputum or mucus plugs or hemoptysis or cp or chest tightness, subjective wheeze or overt sinus or hb symptoms. No unusual exp hx or h/o childhood pna/ asthma or knowledge of premature birth.  Sleeping ok without nocturnal  or early am exacerbation  of respiratory  c/o's or need for noct saba. Also denies any obvious fluctuation of symptoms with weather or environmental changes or other aggravating or alleviating factors except as outlined above   Current Medications, Allergies, Complete Past Medical History, Past Surgical History, Family History, and Social History were reviewed in [employer] record.  ROS  The following are not active complaints unless bolded sore throat, dysphagia, dental problems, itching, sneezing,  nasal congestion or excess/ purulent secretions, ear ache,   fever, chills,  sweats, unintended wt loss, classically pleuritic or exertional cp,  orthopnea pnd or leg swelling, presyncope, palpitations, abdominal pain, anorexia, nausea, vomiting, diarrhea  or change in bowel or bladder habits, change in stools or urine, dysuria,hematuria,  rash, arthralgias, visual complaints, headache, numbness, weakness or ataxia or problems with walking or coordination,  change in mood/affect or memory.                     Objective:   Physical Exam    amb bf nad   01/22/2017        178   09/05/16 181 lb (82.1 kg)  08/23/16 182 lb (82.6 kg)  08/06/16 183 lb 12.8 oz (83.4 kg)    Vital signs reviewed  - Note on arrival 02 sats  97% on RA      HEENT: nl dentition, turbinates, and oropharynx. Nl external ear canals without cough reflex   NECK :  without JVD/Nodes/TM/ nl carotid upstrokes bilaterally   LUNGS: no acc muscle use,  Nl contour chest which is clear to A and P bilaterally without cough on insp or exp maneuvers   CV:  RRR  no s3 or murmur or increase in P2, nad no edema   ABD:  soft and nontender with nl inspiratory excursion in the supine position. No bruits or organomegaly appreciated, bowel sounds nl  MS:  Nl  gait/ ext warm without deformities, calf tenderness, cyanosis or clubbing No obvious joint restrictions   SKIN: warm and dry without lesions    NEURO:  alert, approp, nl sensorium with  no motor or cerebellar deficits apparent.     CXR PA and Lateral:   01/22/2017 :    I personally reviewed images and agree with radiology impression as follows:    No active cardiopulmonary disease.     Assessment:

## 2017-01-22 NOTE — Progress Notes (Signed)
PFT done today. 

## 2017-01-22 NOTE — Assessment & Plan Note (Signed)

## 2017-01-22 NOTE — Assessment & Plan Note (Addendum)
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  Discussed in detail all the  indications, usual  risks and alternatives  relative to the benefits with patient who agrees to proceed with conservative f/u as outlined  For symptom flare and in meantime work hard on stopping smoking completely (see separate a/p)   I had an extended discussion with the patient reviewing all relevant studies completed to date and  lasting 15 to 20 minutes of a 25 minute visit    Each maintenance medication was reviewed in detail including most importantly the difference between maintenance and prns and under what circumstances the prns are to be triggered using an action plan format that is not reflected in the computer generated alphabetically organized AVS.    Please see AVS for specific instructions unique to this visit that I personally wrote and verbalized to the the pt in detail and then reviewed with pt  by my nurse highlighting any  changes in therapy recommended at today's visit to their plan of care.

## 2017-01-22 NOTE — Progress Notes (Signed)
Spoke with pt and notified of results per Dr. Wert. Pt verbalized understanding and denied any questions. 

## 2017-01-23 ENCOUNTER — Ambulatory Visit: Payer: Medicaid Other | Admitting: Podiatry

## 2017-02-11 ENCOUNTER — Other Ambulatory Visit: Payer: Self-pay | Admitting: Internal Medicine

## 2017-02-11 DIAGNOSIS — L299 Pruritus, unspecified: Secondary | ICD-10-CM

## 2017-02-20 ENCOUNTER — Ambulatory Visit (INDEPENDENT_AMBULATORY_CARE_PROVIDER_SITE_OTHER): Payer: Medicaid Other | Admitting: Podiatry

## 2017-02-20 ENCOUNTER — Encounter: Payer: Self-pay | Admitting: Podiatry

## 2017-02-20 VITALS — BP 126/70 | HR 86 | Resp 16

## 2017-02-20 DIAGNOSIS — Q828 Other specified congenital malformations of skin: Secondary | ICD-10-CM | POA: Diagnosis not present

## 2017-02-20 DIAGNOSIS — E119 Type 2 diabetes mellitus without complications: Secondary | ICD-10-CM | POA: Diagnosis not present

## 2017-02-20 NOTE — Patient Instructions (Signed)
Today your diabetic foot exam demonstrated adequate circulation and feeling in your feet. We discussed smoking and encouraged you to stop smoking Return as needed or yearly  Diabetes and Foot Care Diabetes may cause you to have problems because of poor blood supply (circulation) to your feet and legs. This may cause the skin on your feet to become thinner, break easier, and heal more slowly. Your skin may become dry, and the skin may peel and crack. You may also have nerve damage in your legs and feet causing decreased feeling in them. You may not notice minor injuries to your feet that could lead to infections or more serious problems. Taking care of your feet is one of the most important things you can do for yourself. Follow these instructions at home:  Wear shoes at all times, even in the house. Do not go barefoot. Bare feet are easily injured.  Check your feet daily for blisters, cuts, and redness. If you cannot see the bottom of your feet, use a mirror or ask someone for help.  Wash your feet with warm water (do not use hot water) and mild soap. Then pat your feet and the areas between your toes until they are completely dry. Do not soak your feet as this can dry your skin.  Apply a moisturizing lotion or petroleum jelly (that does not contain alcohol and is unscented) to the skin on your feet and to dry, brittle toenails. Do not apply lotion between your toes.  Trim your toenails straight across. Do not dig under them or around the cuticle. File the edges of your nails with an emery board or nail file.  Do not cut corns or calluses or try to remove them with medicine.  Wear clean socks or stockings every day. Make sure they are not too tight. Do not wear knee-high stockings since they may decrease blood flow to your legs.  Wear shoes that fit properly and have enough cushioning. To break in new shoes, wear them for just a few hours a day. This prevents you from injuring your feet. Always  look in your shoes before you put them on to be sure there are no objects inside.  Do not cross your legs. This may decrease the blood flow to your feet.  If you find a minor scrape, cut, or break in the skin on your feet, keep it and the skin around it clean and dry. These areas may be cleansed with mild soap and water. Do not cleanse the area with peroxide, alcohol, or iodine.  When you remove an adhesive bandage, be sure not to damage the skin around it.  If you have a wound, look at it several times a day to make sure it is healing.  Do not use heating pads or hot water bottles. They may burn your skin. If you have lost feeling in your feet or legs, you may not know it is happening until it is too late.  Make sure your health care provider performs a complete foot exam at least annually or more often if you have foot problems. Report any cuts, sores, or bruises to your health care provider immediately. Contact a health care provider if:  You have an injury that is not healing.  You have cuts or breaks in the skin.  You have an ingrown nail.  You notice redness on your legs or feet.  You feel burning or tingling in your legs or feet.  You have pain or  cramps in your legs and feet.  Your legs or feet are numb.  Your feet always feel cold. Get help right away if:  There is increasing redness, swelling, or pain in or around a wound.  There is a red line that goes up your leg.  Pus is coming from a wound.  You develop a fever or as directed by your health care provider.  You notice a bad smell coming from an ulcer or wound. This information is not intended to replace advice given to you by your health care provider. Make sure you discuss any questions you have with your health care provider. Document Released: 09/21/2000 Document Revised: 03/01/2016 Document Reviewed: 03/03/2013 Elsevier Interactive Patient Education  2017 [employer].

## 2017-02-20 NOTE — Progress Notes (Signed)
   Subjective:    Patient ID: Bridget Owens, female    DOB: 09-15-1956, 61 y.o.   MRN: [id number]  HPI   .  This diabetic patient presents today requesting a general diabetic foot examination as well as complaining of a painful plantar skin lesion on the left foot requesting debridement of this lesion. Patient is a diabetic approximately 15 years and denies history of foot ulceration, claudication or amputation Patient is a smoker continues to smoke at least one pack of cigarettes daily since a teenager She was last evaluated and treated in our office on 02/01/2016  Review of Systems  All other systems reviewed and are negative.      Objective:   Physical Exam  Objective: Orientated 3 DP pulses 2/4 bilaterally PT pulses 2/4 bilaterally Capillary reflex immediate bilaterally  Neurological: Sensation to 10 g monofilament wire intact 5/5 bilaterally Vibratory sensation nonreactive right reactive left Ankle reflexes reactive bilaterally  Dermatological: Nucleated keratoses plantar fifth MPJ left and lateral fifth left toe No open wounds noted bilaterally  The toenails are elongated and discolored with deformity and toenails 1, 5 bilaterally  Musculoskeletal: No deformities noted bilaterally No restriction in the ankle, subtalar, midtarsal joints bilaterally Manual motor testing dorsi flexion, plantar flexion, inversion, eversion 5/5 bilaterally      Assessment & Plan:   Assessment: Diabetic with satisfactory neurovascular status Porokeratosis fifth MPJ left Tobacco abuse  Plan: Discuss findings of examination with patient today and informed patient that she has satisfactory neurovascular status. I encouraged her to stop smoking Debride plantar callus fifth left MPJ without any bleeding  Reappoint at patient's request or yearly

## 2017-03-01 ENCOUNTER — Other Ambulatory Visit: Payer: Self-pay | Admitting: Internal Medicine

## 2017-03-01 DIAGNOSIS — E785 Hyperlipidemia, unspecified: Secondary | ICD-10-CM

## 2017-03-06 ENCOUNTER — Other Ambulatory Visit: Payer: Self-pay | Admitting: Internal Medicine

## 2017-03-06 DIAGNOSIS — K219 Gastro-esophageal reflux disease without esophagitis: Secondary | ICD-10-CM

## 2017-03-15 ENCOUNTER — Encounter: Payer: Self-pay | Admitting: *Deleted

## 2017-03-20 ENCOUNTER — Telehealth: Payer: Self-pay | Admitting: *Deleted

## 2017-04-03 ENCOUNTER — Ambulatory Visit: Payer: Medicaid Other

## 2017-04-05 ENCOUNTER — Ambulatory Visit: Payer: Medicaid Other

## 2017-04-12 ENCOUNTER — Ambulatory Visit (INDEPENDENT_AMBULATORY_CARE_PROVIDER_SITE_OTHER): Payer: Medicaid Other | Admitting: Internal Medicine

## 2017-04-12 ENCOUNTER — Encounter: Payer: Self-pay | Admitting: Internal Medicine

## 2017-04-12 VITALS — BP 181/86 | HR 78 | Temp 97.6°F | Ht 65.0 in | Wt 178.1 lb

## 2017-04-12 DIAGNOSIS — M25551 Pain in right hip: Secondary | ICD-10-CM

## 2017-04-12 DIAGNOSIS — Z7984 Long term (current) use of oral hypoglycemic drugs: Secondary | ICD-10-CM

## 2017-04-12 DIAGNOSIS — M25552 Pain in left hip: Secondary | ICD-10-CM

## 2017-04-12 DIAGNOSIS — E1142 Type 2 diabetes mellitus with diabetic polyneuropathy: Secondary | ICD-10-CM

## 2017-04-12 MED ORDER — IBUPROFEN 200 MG PO TABS: 800.0000 mg | ORAL_TABLET | Freq: Three times a day (TID) | ORAL | 0 refills | Status: DC | PRN

## 2017-04-12 NOTE — Progress Notes (Signed)
   CC: Itching, hip pain  HPI:  [name] is a 61 y.o. 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   She reports bilateral hip pain for about 1 month, worse with movement and laying down. She states this is similar to prior hip pain she has had in the past and was told she may have arthritis. Ibuprofen and tramadol (though pt unsure of specific name) have helped in the past but she has not taken any for current sx.   She also reports itching of bilateral LEs x 2 weeks, described as a tingling sensation. She also states this is similar to past sx attributed to her diabetes. Denies pain, weakness, skin changes, erythema. Of note, the pt was prescribed Byetta several months ago but has not been using because she was not instructed how to inject it.   Past Medical History:  Diagnosis Date  . Adenomatous colon polyp 6/09   Resected on colonoscopy, no high grade dysplasia.   . Allergic rhinitis   . Bell's palsy 01/2009   Left sided  . Chest pain    Exercise stress test negative 6/07  . Colon polyps    03/22/2008: adenomatous polyps x3 w no dysplasia. Needs repeat colonoscopy in 3 years  . Diabetic peripheral neuropathy ([place])   . External hemorrhoid   . GERD (gastroesophageal reflux disease)   . Hyperlipidemia   . Hypertension   . Hypertensive retinopathy    Followed by Dr. Herbert Deaner.  . Insomnia   . Intolerance, drug    Leg cramps on Lipitor  . Lipoma 12/11   Posterior neck.   . Osteoarthritis    Carpometacarpal joint of right thumb  . Postmenopausal bleeding 9/05   Endometrial biopsy showed  FOCAL TUBAL METAPLASIA   . Postmenopausal symptoms    Hot flashes, vaginal dryness, iritability, difficulty sleeping. as of 2005.  Marland Kitchen Sebaceous cyst of breast    Has been refered to derm.  . Trigger finger    Right thumb  . Type II diabetes mellitus ([place])   . Uterine fibroid    Review of Systems:   Review of Systems  Constitutional: Negative for fever.  Musculoskeletal: Positive for joint pain. Negative for falls.  Skin: Positive for itching. Negative for rash.  Neurological: Positive for tingling. Negative for focal weakness.    Physical Exam:  Vitals:   04/12/17 1107  BP: (!) 181/86  Pulse: 78  Temp: 97.6 F (36.4 C)  TempSrc: Oral  SpO2: 100%  Weight: 178 lb 1.6 oz (80.8 kg)  Height: 5\' 5"  (1.651 m)   Physical Exam  Constitutional: She is oriented to person, place, and time. She appears well-developed and well-nourished.  Cardiovascular: Normal rate and regular rhythm.   Musculoskeletal:  Tenderness to palpation of L lateral hip, full ROM and 5/5 strength of bilateral LEs   Neurological: She is alert and oriented to person, place, and time.  Sensation intact to bilateral LEs  Skin: Skin is warm and dry. Capillary refill takes less than 2 seconds. No rash noted. No erythema.    Assessment & Plan:   See Encounters Tab for problem based charting.  Patient seen with Dr. Angelia Mould

## 2017-04-12 NOTE — Patient Instructions (Addendum)
Thank you for coming in [name].   Bridget Owens showed you how to use the Byetta medicine. Start using this at home to help control your diabetes, which should help with the itching you've been having. We will also check blood work to see how you're diabetes has been doing.   For your hip pain, you can take 800 mg of Ibuprofen three times a day, from a drug store.   For your ear, keep an eye on this and let us know if it isn't getting better or if it becomes worse.

## 2017-04-12 NOTE — Assessment & Plan Note (Addendum)
DM with neuropathy Assessment: Pt describes tingling sensation causing itching, consistent with a presentation of diabetic neuropathy and her past presentation. She has not been taking exenatide likely resulting in poor glycemic control and these symptoms. Will try to reestablish better control before prescribing medications specifically targeted at neuropathy sx such as gabapentin, pregabalin, or TCAs. Butch Penny instructed pt on exenatide injections.   Plan: -Restart Byetta 5 mcg BID -Continue Metformin -A1c  -F/u with PCP in 3 months to reassess control and sx

## 2017-04-12 NOTE — Assessment & Plan Note (Signed)
Hip Pain Assessment: Bilateral hip pain related to activity likely arthritic in nature as she has experienced in the past, may have element of bursitis given tenderness to palpation of lateral hip on exam. NSAID treatment would help improve both etiologies and the patient has not tried tx for current symtpoms. Ibuprofen sent to pharmacy via rx per patient preference   Plan:  -Ibuprofen 800 mg tid prn

## 2017-04-13 ENCOUNTER — Other Ambulatory Visit: Payer: Self-pay | Admitting: Internal Medicine

## 2017-04-13 DIAGNOSIS — E785 Hyperlipidemia, unspecified: Secondary | ICD-10-CM

## 2017-04-13 LAB — HEMOGLOBIN A1C
Est. average glucose Bld gHb Est-mCnc: 163 mg/dL
Hgb A1c MFr Bld: 7.3 % — ABNORMAL HIGH (ref 4.8–5.6)

## 2017-04-16 NOTE — Progress Notes (Signed)
Internal Medicine Clinic Attending  I saw and evaluated the patient.  I personally confirmed the key portions of the history and exam documented by Dr. Harden and I reviewed pertinent patient test results.  The assessment, diagnosis, and plan were formulated together and I agree with the documentation in the resident's note.  

## 2017-04-18 NOTE — Addendum Note (Signed)
Addended by: Hulan Fray on: 04/18/2017 01:35 PM   Modules accepted: Orders

## 2017-04-22 ENCOUNTER — Other Ambulatory Visit: Payer: Self-pay

## 2017-04-22 DIAGNOSIS — E1142 Type 2 diabetes mellitus with diabetic polyneuropathy: Secondary | ICD-10-CM

## 2017-04-22 MED ORDER — GLIMEPIRIDE 4 MG PO TABS: 4.0000 mg | ORAL_TABLET | Freq: Every day | ORAL | 3 refills | Status: DC

## 2017-05-21 ENCOUNTER — Other Ambulatory Visit: Payer: Self-pay | Admitting: *Deleted

## 2017-05-21 DIAGNOSIS — E1142 Type 2 diabetes mellitus with diabetic polyneuropathy: Secondary | ICD-10-CM

## 2017-05-21 MED ORDER — METFORMIN HCL 1000 MG PO TABS: ORAL_TABLET | ORAL | 0 refills | Status: DC

## 2017-06-05 ENCOUNTER — Other Ambulatory Visit: Payer: Self-pay | Admitting: *Deleted

## 2017-06-05 DIAGNOSIS — L299 Pruritus, unspecified: Secondary | ICD-10-CM

## 2017-06-07 ENCOUNTER — Other Ambulatory Visit: Payer: Self-pay | Admitting: *Deleted

## 2017-06-07 DIAGNOSIS — K219 Gastro-esophageal reflux disease without esophagitis: Secondary | ICD-10-CM

## 2017-06-07 DIAGNOSIS — E1142 Type 2 diabetes mellitus with diabetic polyneuropathy: Secondary | ICD-10-CM

## 2017-06-07 MED ORDER — PANTOPRAZOLE SODIUM 40 MG PO TBEC: 40.0000 mg | DELAYED_RELEASE_TABLET | Freq: Every day | ORAL | 2 refills | Status: DC

## 2017-06-07 MED ORDER — ACCU-CHEK FASTCLIX LANCETS MISC: 11 refills | Status: DC

## 2017-06-17 ENCOUNTER — Ambulatory Visit: Payer: Self-pay

## 2017-07-05 ENCOUNTER — Ambulatory Visit: Payer: Self-pay

## 2017-07-09 ENCOUNTER — Ambulatory Visit (INDEPENDENT_AMBULATORY_CARE_PROVIDER_SITE_OTHER): Payer: Medicaid Other | Admitting: Internal Medicine

## 2017-07-09 ENCOUNTER — Ambulatory Visit: Payer: Self-pay

## 2017-07-09 VITALS — BP 145/71 | HR 91 | Temp 98.1°F | Ht 67.0 in | Wt 180.9 lb

## 2017-07-09 DIAGNOSIS — M1990 Unspecified osteoarthritis, unspecified site: Secondary | ICD-10-CM | POA: Diagnosis not present

## 2017-07-09 DIAGNOSIS — R109 Unspecified abdominal pain: Secondary | ICD-10-CM

## 2017-07-09 DIAGNOSIS — E785 Hyperlipidemia, unspecified: Secondary | ICD-10-CM

## 2017-07-09 DIAGNOSIS — I10 Essential (primary) hypertension: Secondary | ICD-10-CM | POA: Diagnosis not present

## 2017-07-09 MED ORDER — SENNOSIDES-DOCUSATE SODIUM 8.6-50 MG PO TABS: 1.0000 | ORAL_TABLET | Freq: Every day | ORAL | 1 refills | Status: DC

## 2017-07-09 MED ORDER — POLYETHYLENE GLYCOL 3350 17 G PO PACK: 17.0000 g | PACK | Freq: Every day | ORAL | 1 refills | Status: DC

## 2017-07-09 NOTE — Assessment & Plan Note (Addendum)
Patient with non-specific left sided abdominal/flank pain that is stabbing, burning, or pruritic in nature. She was seen before in November 2017 with similar complaints and thought to have post herpetic neuralgia. She was started on a trial of gabapentin at the time. She says she has not seen any rash over the area or had shingles before. Doubt rib injury/fracture as she has not had any injury and her pain is below her left ribs. She denies any heavy lifting to suggest muscle strain.   She denies any dysuria, back pain, and has no CVA tenderness to suggest UTI/Pyelo/nephrolithiasis. Pain is worse with deep palpation over her flank without obvious mass felt. She reports chronic constipation and had to give herself an enema just to have a bowel movement four days ago, which was her last BM. At this point, I will treat her as constipation with a bowel regimen and monitor for improvement. If she does not improve, I would consider bony abnormality such as vertebral fracture with referred pain, although she has not been tested for osteoporosis or been on chronic steroids. - Start miralax and Senokot-S daily until regular bowel movements; then can use as needed - f/u with [place] if no improvement in 2 weeks, otherwise can follow up with PCP

## 2017-07-09 NOTE — Patient Instructions (Signed)
It was a pleasure to see you [name].  Your left sided abdominal pain may be due to your constipation.  We will try starting a bowel regimen with Miralax and Senokot-S. Take these daily until you are having regular bowel movements, then you can take these as needed.  Please see [place] in 2 weeks if you are not feeling better, otherwise you may follow up with your PCP.

## 2017-07-09 NOTE — Progress Notes (Signed)
CC: Left abdominal pain  HPI:  [name] is a 61 y.o. female with PMH as listed below including HTN, Pulmonary Fibrosis, T2DM, and GERD who presents for left sided abdominal pain.  Pain began in July and was not too bothersome so she did not think much of it. It has since worsened and describes her pain as alternating between stabbing, burning, or itching. She has not noticed any rash in the area. She feels like there is a "knot" inside. She has been having chronic constipation with her last bowel movement 4 days ago for which she required an enema. She says she has never had regular bowel movements. She does not take any narcotic medications. She denies any dysuria, diarrhea, fevers, chills, injury, heavy lifting, nausea, or vomiting.  Past Medical History:  Diagnosis Date  . Adenomatous colon polyp 6/09   Resected on colonoscopy, no high grade dysplasia.   . Allergic rhinitis   . Bell's palsy 01/2009   Left sided  . Chest pain    Exercise stress test negative 6/07  . Colon polyps    03/22/2008: adenomatous polyps x3 w no dysplasia. Needs repeat colonoscopy in 3 years  . Diabetic peripheral neuropathy ([place])   . External hemorrhoid   . GERD (gastroesophageal reflux disease)   . Hyperlipidemia   . Hypertension   . Hypertensive retinopathy    Followed by Dr. Herbert Deaner.  . Insomnia   . Intolerance, drug    Leg cramps on Lipitor  . Lipoma 12/11   Posterior neck.   . Osteoarthritis    Carpometacarpal joint of right thumb  . Postmenopausal bleeding 9/05   Endometrial biopsy showed  FOCAL TUBAL METAPLASIA   . Postmenopausal symptoms    Hot flashes, vaginal dryness, iritability, difficulty sleeping. as of 2005.  Marland Kitchen Sebaceous cyst of breast    Has been refered to derm.  . Trigger finger    Right thumb  . Type II diabetes mellitus ([place])   . Uterine fibroid    Review of Systems:   Review of Systems  Constitutional: Negative for chills, diaphoresis and fever.  Respiratory:  Negative for shortness of breath.   Cardiovascular: Negative for chest pain.  Gastrointestinal: Positive for abdominal pain and constipation. Negative for diarrhea, nausea and vomiting.  Genitourinary: Negative for dysuria.  Psychiatric/Behavioral: Negative for substance abuse.     Physical Exam:  Vitals:   07/09/17 1600  BP: (!) 145/71  Pulse: 91  Temp: 98.1 F (36.7 C)  TempSrc: Oral  SpO2: 99%  Weight: 180 lb 14.4 oz (82.1 kg)  Height: 5\' 7"  (1.702 m)   Physical Exam  Constitutional: She is oriented to person, place, and time. She appears well-developed and well-nourished. No distress.  Cardiovascular: Normal rate and regular rhythm.   Pulmonary/Chest: Effort normal. No respiratory distress. She has no wheezes. She has no rales.  Abdominal: Soft. She exhibits no distension.  Left flank mildly tender with deep palpation without obvious mass. No rash or bruising. No CVA tenderness. No guarding. No hernia. No surgical scar.  Musculoskeletal: Normal range of motion.  Neurological: She is alert and oriented to person, place, and time.  Skin: Skin is warm. She is not diaphoretic.  Psychiatric: She has a normal mood and affect.    Assessment & Plan:   See Encounters Tab for problem based charting.  Patient discussed with Dr. Lynnae January  Left flank pain Patient with non-specific left sided abdominal/flank pain that is stabbing, burning, or pruritic in nature. She  was seen before in November 2017 with similar complaints and thought to have post herpetic neuralgia. She was started on a trial of gabapentin at the time. She says she has not seen any rash over the area or had shingles before. Doubt rib injury/fracture as she has not had any injury and her pain is below her left ribs. She denies any heavy lifting to suggest muscle strain.   She denies any dysuria, back pain, and has no CVA tenderness to suggest UTI/Pyelo/nephrolithiasis. Pain is worse with deep palpation over her flank  without obvious mass felt. She reports chronic constipation and had to give herself an enema just to have a bowel movement four days ago, which was her last BM. At this point, I will treat her as constipation with a bowel regimen and monitor for improvement. If she does not improve, I would consider bony abnormality such as vertebral fracture with referred pain, although she has not been tested for osteoporosis or been on chronic steroids. - Start miralax and Senokot-S daily until regular bowel movements; then can use as needed - f/u with [place] if no improvement in 2 weeks, otherwise can follow up with PCP

## 2017-07-10 NOTE — Progress Notes (Signed)
Internal Medicine Clinic Attending  Case discussed with Dr. Patel at the time of the visit.  We reviewed the resident's history and exam and pertinent patient test results.  I agree with the assessment, diagnosis, and plan of care documented in the resident's note.  

## 2017-07-13 ENCOUNTER — Inpatient Hospital Stay (HOSPITAL_COMMUNITY)
Admission: AD | Admit: 2017-07-13 | Discharge: 2017-07-13 | Disposition: A | Discharge status: Home / Self Care | Payer: Medicaid Other | Source: Ambulatory Visit | Attending: Obstetrics & Gynecology | Admitting: Obstetrics & Gynecology

## 2017-07-13 ENCOUNTER — Inpatient Hospital Stay (HOSPITAL_COMMUNITY): Payer: Medicaid Other

## 2017-07-13 DIAGNOSIS — R0789 Other chest pain: Secondary | ICD-10-CM | POA: Insufficient documentation

## 2017-07-13 DIAGNOSIS — F17210 Nicotine dependence, cigarettes, uncomplicated: Secondary | ICD-10-CM | POA: Insufficient documentation

## 2017-07-13 DIAGNOSIS — R0781 Pleurodynia: Secondary | ICD-10-CM | POA: Diagnosis not present

## 2017-07-13 LAB — URINALYSIS, ROUTINE W REFLEX MICROSCOPIC
Bilirubin Urine: NEGATIVE
Glucose, UA: NEGATIVE mg/dL
Ketones, ur: NEGATIVE mg/dL
Nitrite: NEGATIVE
Protein, ur: NEGATIVE mg/dL
Specific Gravity, Urine: 1.016 (ref 1.005–1.030)
pH: 5 (ref 5.0–8.0)

## 2017-07-13 MED ORDER — CYCLOBENZAPRINE HCL 5 MG PO TABS: 5.0000 mg | ORAL_TABLET | Freq: Three times a day (TID) | ORAL | 0 refills | Status: DC | PRN
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
## 2017-07-13 NOTE — MAU Note (Signed)
End of shift, report given to Bowen, RN.

## 2017-07-13 NOTE — MAU Note (Signed)
Pain in left side since July. Pain comes and goes. Was told in [facility] was constipated. Last BM 2 days ago and was normal. Also having pain at belly button that is sharp

## 2017-07-13 NOTE — Discharge Instructions (Signed)
Chest Wall Pain °Chest wall pain is pain in or around the bones and muscles of your chest. Sometimes, an injury causes this pain. Sometimes, the cause may not be known. This pain may take several weeks or longer to get better. °Follow these instructions at home: °Pay attention to any changes in your symptoms. Take these actions to help with your pain: °· Rest as told by your health care provider. °· Avoid activities that cause pain. These include any activities that use your chest muscles or your abdominal and side muscles to lift heavy items. °· If directed, apply ice to the painful area: °¨ Put ice in a plastic bag. °¨ Place a towel between your skin and the bag. °¨ Leave the ice on for 20 minutes, 2-3 times per day. °· Take over-the-counter and prescription medicines only as told by your health care provider. °· Do not use tobacco products, including cigarettes, chewing tobacco, and e-cigarettes. If you need help quitting, ask your health care provider. °· Keep all follow-up visits as told by your health care provider. This is important. °Contact a health care provider if: °· You have a fever. °· Your chest pain becomes worse. °· You have new symptoms. °Get help right away if: °· You have nausea or vomiting. °· You feel sweaty or light-headed. °· You have a cough with phlegm (sputum) or you cough up blood. °· You develop shortness of breath. °This information is not intended to replace advice given to you by your health care provider. Make sure you discuss any questions you have with your health care provider. °Document Released: 09/24/2005 Document Revised: 02/02/2016 Document Reviewed: 12/20/2014 °Elsevier Interactive Patient Education © 2017 [employer] Inc. ° °

## 2017-07-16 ENCOUNTER — Encounter (HOSPITAL_COMMUNITY): Payer: Self-pay | Admitting: Emergency Medicine

## 2017-07-16 DIAGNOSIS — Z79899 Other long term (current) drug therapy: Secondary | ICD-10-CM | POA: Diagnosis not present

## 2017-07-16 DIAGNOSIS — R1084 Generalized abdominal pain: Secondary | ICD-10-CM | POA: Diagnosis not present

## 2017-07-16 DIAGNOSIS — E1140 Type 2 diabetes mellitus with diabetic neuropathy, unspecified: Secondary | ICD-10-CM | POA: Diagnosis not present

## 2017-07-16 DIAGNOSIS — Z7984 Long term (current) use of oral hypoglycemic drugs: Secondary | ICD-10-CM | POA: Diagnosis not present

## 2017-07-16 DIAGNOSIS — I10 Essential (primary) hypertension: Secondary | ICD-10-CM | POA: Diagnosis not present

## 2017-07-16 DIAGNOSIS — F17210 Nicotine dependence, cigarettes, uncomplicated: Secondary | ICD-10-CM | POA: Diagnosis not present

## 2017-07-16 LAB — COMPREHENSIVE METABOLIC PANEL
ALT: 17 U/L (ref 14–54)
AST: 16 U/L (ref 15–41)
Albumin: 4 g/dL (ref 3.5–5.0)
Alkaline Phosphatase: 64 U/L (ref 38–126)
Anion gap: 10 (ref 5–15)
BUN: 14 mg/dL (ref 6–20)
CO2: 26 mmol/L (ref 22–32)
Calcium: 9.6 mg/dL (ref 8.9–10.3)
Chloride: 99 mmol/L — ABNORMAL LOW (ref 101–111)
Creatinine, Ser: 0.91 mg/dL (ref 0.44–1.00)
GFR calc Af Amer: >60 mL/min (ref >60)
GFR calc non Af Amer: >60 mL/min (ref >60)
Glucose, Bld: 193 mg/dL — ABNORMAL HIGH (ref 65–99)
Potassium: 3.3 mmol/L — ABNORMAL LOW (ref 3.5–5.1)
Sodium: 135 mmol/L (ref 135–145)
Total Bilirubin: 0.5 mg/dL (ref 0.3–1.2)
Total Protein: 6.9 g/dL (ref 6.5–8.1)

## 2017-07-16 LAB — URINALYSIS, ROUTINE W REFLEX MICROSCOPIC
Bilirubin Urine: NEGATIVE
Glucose, UA: NEGATIVE mg/dL
Hgb urine dipstick: NEGATIVE
Ketones, ur: NEGATIVE mg/dL
Nitrite: NEGATIVE
Protein, ur: NEGATIVE mg/dL
Specific Gravity, Urine: 1.013 (ref 1.005–1.030)
pH: 5 (ref 5.0–8.0)

## 2017-07-16 LAB — CBC WITH DIFFERENTIAL/PLATELET
Basophils Absolute: 0 10*3/uL (ref 0.0–0.1)
Basophils Relative: 0 %
Eosinophils Absolute: 0.2 10*3/uL (ref 0.0–0.7)
Eosinophils Relative: 2 %
HCT: 39.5 % (ref 36.0–46.0)
Hemoglobin: 13.1 g/dL (ref 12.0–15.0)
Lymphocytes Relative: 30 %
Lymphs Abs: 2.7 10*3/uL (ref 0.7–4.0)
MCH: 28.8 pg (ref 26.0–34.0)
MCHC: 33.2 g/dL (ref 30.0–36.0)
MCV: 86.8 fL (ref 78.0–100.0)
Monocytes Absolute: 0.4 10*3/uL (ref 0.1–1.0)
Monocytes Relative: 5 %
Neutro Abs: 5.5 10*3/uL (ref 1.7–7.7)
Neutrophils Relative %: 63 %
Platelets: 325 10*3/uL (ref 150–400)
RBC: 4.55 MIL/uL (ref 3.87–5.11)
RDW: 13.1 % (ref 11.5–15.5)
WBC: 8.8 10*3/uL (ref 4.0–10.5)

## 2017-07-16 NOTE — ED Triage Notes (Signed)
Patient reports chronic left flank pain for 1 month with nausea , pain radiating to left lateral abdomen , no emesis or diarrhea , denies fever or chills .

## 2017-07-17 ENCOUNTER — Emergency Department (HOSPITAL_COMMUNITY)
Admission: EM | Admit: 2017-07-17 | Discharge: 2017-07-17 | Disposition: A | Discharge status: Home / Self Care | Payer: Medicaid Other | Attending: Emergency Medicine | Admitting: Emergency Medicine

## 2017-07-17 ENCOUNTER — Telehealth: Payer: Self-pay | Admitting: General Practice

## 2017-07-17 ENCOUNTER — Emergency Department (HOSPITAL_COMMUNITY): Payer: Medicaid Other

## 2017-07-17 ENCOUNTER — Ambulatory Visit: Payer: Medicaid Other

## 2017-07-17 DIAGNOSIS — R109 Unspecified abdominal pain: Secondary | ICD-10-CM

## 2017-07-17 MED ORDER — HYDROCODONE-ACETAMINOPHEN 5-325 MG PO TABS
1.0000 | ORAL_TABLET | Freq: Once | ORAL | Status: AC
  Administered 2017-07-17: 1 via ORAL
  Filled 2017-07-17: qty 1
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
## 2017-07-17 NOTE — Telephone Encounter (Signed)
Called pt, states she wants pain medicine called in, informed her this cannot be done, made appt in [facility] at her choosing fri 10/12 at [address] [facility]

## 2017-07-17 NOTE — Telephone Encounter (Signed)
Patient went to the hospital last night, Patient was told she have the shingles and she would like to know if the Dr can prescribe some pain meds. Pls call patient

## 2017-07-19 ENCOUNTER — Ambulatory Visit (INDEPENDENT_AMBULATORY_CARE_PROVIDER_SITE_OTHER): Payer: Medicaid Other | Admitting: Internal Medicine

## 2017-07-19 ENCOUNTER — Encounter: Payer: Self-pay | Admitting: Internal Medicine

## 2017-07-19 VITALS — BP 156/72 | HR 85 | Temp 98.2°F | Wt 180.0 lb

## 2017-07-19 DIAGNOSIS — R208 Other disturbances of skin sensation: Secondary | ICD-10-CM

## 2017-07-19 DIAGNOSIS — F17210 Nicotine dependence, cigarettes, uncomplicated: Secondary | ICD-10-CM

## 2017-07-19 DIAGNOSIS — Z79899 Other long term (current) drug therapy: Secondary | ICD-10-CM

## 2017-07-19 DIAGNOSIS — R109 Unspecified abdominal pain: Secondary | ICD-10-CM

## 2017-07-19 MED ORDER — PREGABALIN ER 165 MG PO TB24: 165.0000 mg | ORAL_TABLET | Freq: Every day | ORAL | 0 refills | Status: DC

## 2017-07-19 NOTE — Assessment & Plan Note (Addendum)
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  She describes the pain as burning, and 10/10 pain, which comes and goes but is present at baseline. Her shirt touching the area causes the pain and so she has to not let any objects touch that area. She mentions that the pain started last month. However, review of prior records states that she was seen in Nov 2017 for similiar complaint and the pain was attributed to hyperalgesia in a dermatomal distribution- possibly post-herpetic neuralgia. Then she was seen on Oct 2nd and it was thought constipation was contributing to it so she was given miralax- but patient says she never had constipation and is not taking it.  In November she was started on gabapentin, but she says she never heard of gabapentin She then went to ER recently and was told she had muscle spasm and given flexeril, and had CT renal done which was unremarkable. CXR was done last week which shows demineralized bones but no acute fracture.   Plan -We do not know the etiology of the pain currently but We will treat as neuropathic pain with extended release lyrica daily 165 mg at bedtime. Precautions given about this medicine causing drowsiness. -She will return in 1 month to re-assess- if she is having persistent pain despite this treatment, then may consider obtaining MRI of lumbar and thoracic spine- if perhaps there is a nerve root impingement- though her normal neuro exam is reassuring today.   -Her CXR shows demineralized bones. I calculated her FRAX score which shows 8.5% major osteoporotic fracture in 10 years, and 1.2% hip fracture in 10 years with smoking included. She can obtain the DEXA scan at 34. There is no history of prior steroid use. She has no prior history of fractures, but has history of smoking.

## 2017-07-19 NOTE — Progress Notes (Signed)
Internal Medicine Clinic Attending  Case discussed with Dr. Saraiya at the time of the visit.  We reviewed the resident's history and exam and pertinent patient test results.  I agree with the assessment, diagnosis, and plan of care documented in the resident's note.  

## 2017-07-19 NOTE — Progress Notes (Signed)
    CC: left 10 dermatomal pain HPI: Ms.Jalayna L Caterino is a 61 y.o. woman with PMH noted below here for left 10 persistent dermatomal pain  Please see Problem List/A&P for the status of the patient's chronic medical problems   Past Medical History:  Diagnosis Date  . Adenomatous colon polyp 6/09   Resected on colonoscopy, no high grade dysplasia.   . Allergic rhinitis   . Bell's palsy 01/2009   Left sided  . Chest pain    Exercise stress test negative 6/07  . Colon polyps    03/22/2008: adenomatous polyps x3 w no dysplasia. Needs repeat colonoscopy in 3 years  . Diabetic peripheral neuropathy ([place])   . External hemorrhoid   . GERD (gastroesophageal reflux disease)   . Hyperlipidemia   . Hypertension   . Hypertensive retinopathy    Followed by Dr. Herbert Deaner.  . Insomnia   . Intolerance, drug    Leg cramps on Lipitor  . Lipoma 12/11   Posterior neck.   . Osteoarthritis    Carpometacarpal joint of right thumb  . Postmenopausal bleeding 9/05   Endometrial biopsy showed  FOCAL TUBAL METAPLASIA   . Postmenopausal symptoms    Hot flashes, vaginal dryness, iritability, difficulty sleeping. as of 2005.  Marland Kitchen Sebaceous cyst of breast    Has been refered to derm.  . Trigger finger    Right thumb  . Type II diabetes mellitus ([place])   . Uterine fibroid     Review of Systems:  Constitutional: Negative for fever, chills, weight loss and malaise/fatigue.  HEENT: No headaches, vision problems, cough, hearing problems  Respiratory: Negative for cough, shortness of breath and wheezing.  Cardiovascular: Negative for chest pain, orthopnea, or PND   Gastrointestinal: Negative for heartburn, nausea, vomiting, abdominal pain, diarrhea Musculoskeletal: Negative for myalgias, joint pain, falls Skin: Negative for rash.  Neurological: Negative for tingling.  Physical Exam: Vitals:   07/19/17 1327  BP: (!) 156/72  Pulse: 85  Temp: 98.2 F (36.8 C)  TempSrc: Oral  SpO2: 100%  Weight: 180  lb (81.6 kg)    General: A&O, in NAD Neck: supple, midline trachea, CV: RRR, normal s1, s2, no m/r/g Resp: equal and symmetric breath sounds, no wheezing or crackles  Abdomen: soft, nontender, nondistended, +BS Skin: warm, dry, intact, no open lesions or rashes noted Extremities: no edema  MSK: hyperalgesia and pain at left t10 dermatomal distribution from umbilicus to back in a band like pattern Neurologic exam: CN II-XII grossly intact DTRs: 2+ and symmetric. No hyperreflexia  Sensory: intact to light touch- left t10 hyperalgesia  Motor: 5/5 strength in upper, 5/5 in lower extremities, normal muscle tone Cerebellar: gait normal   Assessment & Plan:   See encounters tab for problem based medical decision making. Patient discussed with Dr. Evette Doffing

## 2017-07-19 NOTE — Patient Instructions (Addendum)
Thank you for your visit today Please start taking the pregabalin medicine daily at night time. As we discussed, this may may you sleepy, so do not take during daytime or drive when you take it. Please follow up in 1 month, earlier if your pain worsens

## 2017-07-22 ENCOUNTER — Telehealth: Payer: Self-pay | Admitting: Internal Medicine

## 2017-07-22 ENCOUNTER — Telehealth: Payer: Self-pay | Admitting: *Deleted

## 2017-07-22 NOTE — Telephone Encounter (Addendum)
Information was called to [facility] Tracks for PA or Lyrica.  Will await determination within 24 hours.  Confirmation number [id number].  Interaction number [id number].  Sander Nephew, RN 07/22/2017 356 PM. Spoke with  Tracks on 07/23/2017 denied. Sander Nephew, RN 07/23/2017 11:40 AM.

## 2017-07-22 NOTE — Telephone Encounter (Signed)
Pt called and could not understand what she was trying to tell me, called pharmacy, needs PA on pregabalin, will send to [facility]

## 2017-07-23 ENCOUNTER — Other Ambulatory Visit: Payer: Self-pay | Admitting: Oncology

## 2017-07-23 DIAGNOSIS — K219 Gastro-esophageal reflux disease without esophagitis: Secondary | ICD-10-CM

## 2017-07-23 DIAGNOSIS — L299 Pruritus, unspecified: Secondary | ICD-10-CM

## 2017-07-23 NOTE — Telephone Encounter (Signed)
Sent in to [facility] on yesterday.  Awaiting a decision.

## 2017-07-24 NOTE — Telephone Encounter (Signed)
Pt called, she was informed that we are awaiting insurance to approve PA

## 2017-07-25 NOTE — Telephone Encounter (Signed)
Meloxicam, Naproxsyn, Toradol tabs, , Ibuprofen, Indomethacin

## 2017-07-25 NOTE — Telephone Encounter (Signed)
Yes. Bridget Owens

## 2017-07-25 NOTE — Telephone Encounter (Signed)
Is the pregabalin denied? Did they give any other medicines which may be approved under her insurance- like regular pregabalin, or gabapentin ?  thanks

## 2017-07-26 MED ORDER — GABAPENTIN 600 MG PO TABS: 600.0000 mg | ORAL_TABLET | Freq: Every day | ORAL | 2 refills | Status: DC

## 2017-07-26 NOTE — Telephone Encounter (Signed)
NSAIDs will not address her pain. Im sending gabaprntin

## 2017-07-29 NOTE — Telephone Encounter (Addendum)
Patient called c/o pain inquiring about what meds to take for this pain. She stated L sided pain started when she started taking "diabetic pens" (on Byetta) Called [facility]- gabapentin was sent 07/26/17 but pharmacy said need PA. Thanks! Informed patient that gaba need PA.

## 2017-07-29 NOTE — Telephone Encounter (Signed)
Is there anything I need to do regarding the prior auth?  Thanks

## 2017-07-31 ENCOUNTER — Telehealth: Payer: Self-pay | Admitting: *Deleted

## 2017-07-31 DIAGNOSIS — G5793 Unspecified mononeuropathy of bilateral lower limbs: Secondary | ICD-10-CM

## 2017-07-31 NOTE — Telephone Encounter (Addendum)
Call to [place] for PA for Gabapentin 600 mg tablets.  Information was given. To be sent to Pharmacy for review.  Determination within 24 hours.  [place]  [phone]. [id number].  Sander Nephew, RN 07/31/2017 9:50 AM. Call to [facility] .  Denied. Use for medication has not been approved for usage

## 2017-08-01 ENCOUNTER — Other Ambulatory Visit: Payer: Self-pay | Admitting: Pharmacist

## 2017-08-01 DIAGNOSIS — E1142 Type 2 diabetes mellitus with diabetic polyneuropathy: Secondary | ICD-10-CM

## 2017-08-01 MED ORDER — METFORMIN HCL 1000 MG PO TABS: ORAL_TABLET | ORAL | 0 refills | Status: DC

## 2017-08-02 MED ORDER — GABAPENTIN 300 MG PO CAPS: 600.0000 mg | ORAL_CAPSULE | Freq: Every day | ORAL | 2 refills | Status: DC

## 2017-09-03 ENCOUNTER — Telehealth: Payer: Self-pay | Admitting: Dietician

## 2017-09-03 DIAGNOSIS — E1142 Type 2 diabetes mellitus with diabetic polyneuropathy: Secondary | ICD-10-CM

## 2017-09-03 MED ORDER — ACCU-CHEK FASTCLIX LANCETS MISC: 11 refills | Status: DC

## 2017-09-03 NOTE — Telephone Encounter (Signed)
Nylee says she is having trouble getting her lancets. Requested prescription be sent to [facility] on [address].

## 2017-09-05 ENCOUNTER — Other Ambulatory Visit: Payer: Self-pay

## 2017-09-05 ENCOUNTER — Ambulatory Visit (INDEPENDENT_AMBULATORY_CARE_PROVIDER_SITE_OTHER): Payer: Medicaid Other | Admitting: Internal Medicine

## 2017-09-05 VITALS — BP 150/73 | HR 90 | Temp 97.5°F | Ht 67.0 in | Wt 180.5 lb

## 2017-09-05 DIAGNOSIS — R0789 Other chest pain: Secondary | ICD-10-CM

## 2017-09-05 DIAGNOSIS — L602 Onychogryphosis: Secondary | ICD-10-CM | POA: Diagnosis not present

## 2017-09-05 DIAGNOSIS — H9203 Otalgia, bilateral: Secondary | ICD-10-CM

## 2017-09-05 NOTE — Patient Instructions (Addendum)
FOLLOW-UP INSTRUCTIONS When: In a month with PCP For: Diabetes, foot ulcers and toenail, GERD What to bring: Medications  I recommend going back to taking the pantoprazole 40mg  every morning for your heartburn pain. I think this is also contributing to your cough. Your smoking is also contributing to your cough.  I am placing a referral to see the foot center for your toenails and calluses. They may recommend starting an antifungal medication for your toenails.  You may start using the peroxide drops for ear wax, drops in each ear twice daily for several days

## 2017-09-05 NOTE — Progress Notes (Signed)
CC: Brown toe nails  HPI:  [name] is a 61 y.o. female with PMHx detailed below presenting with toenail problems. She also reports sore throat with coughing, also bilateral ear pain.  See problem based assessment and plan below for additional details.  Burning chest pain She also reports increased heartburn symptoms with sharp, retrosternal pain. She does not know if this correlates with eating, before or after. She used to take pantoprazole for acid reflux but has stopping doing this since she did not need it. This sensation is sometimes associated with nonproductive coughing. She denies any nausea, vomiting, radiation of pain. Plan Restart pantoprazole for suspected GERD related symptoms  Hypertrophic toenail She has bilateral hypertrophic and darkened toenails. There is no surrounding erythema or skin changes. It is not clear to me that she has an active fungal infection. However she has reported diabetic neuropathy and also has callus formation in pressure sensitive areas. She would probably benefit from evaluation and recommendations by podiatry, though I do not see a strong indication to start medications at this time. Plan Referral to podiatry  Ear pain, bilateral She has significant but nonobstructive cerumen bilaterally. I do not see obvious erythema or lesions in her external ear canals. TMs are intact and she has no hearing changes. She denies instrumentation with Qtips or otherwise. Plan Start carbamide peroxide drops BID in each ear for at least 5-7 days    Past Medical History:  Diagnosis Date  . Adenomatous colon polyp 6/09   Resected on colonoscopy, no high grade dysplasia.   . Allergic rhinitis   . Bell's palsy 01/2009   Left sided  . Chest pain    Exercise stress test negative 6/07  . Colon polyps    03/22/2008: adenomatous polyps x3 w no dysplasia. Needs repeat colonoscopy in 3 years  . Diabetic peripheral neuropathy ([place]rid[place] Owens)   . External hemorrhoid     . GERD (gastroesophageal reflux disease)   . Hyperlipidemia   . Hypertension   . Hypertensive retinopathy    Followed by Dr. Herbert Deaner.  . Insomnia   . Intolerance, drug    Leg cramps on Lipitor  . Lipoma 12/11   Posterior neck.   . Osteoarthritis    Carpometacarpal joint of right thumb  . Postmenopausal bleeding 9/05   Endometrial biopsy showed  FOCAL TUBAL METAPLASIA   . Postmenopausal symptoms    Hot flashes, vaginal dryness, iritability, difficulty sleeping. as of 2005.  Marland Kitchen Sebaceous cyst of breast    Has been refered to derm.  . Trigger finger    Right thumb  . Type II diabetes mellitus ([place])   . Uterine fibroid     Review of Systems: Review of Systems  Constitutional: Negative for chills and fever.  Cardiovascular: Negative for leg swelling.  Skin: Negative for rash.  Neurological: Positive for tingling and sensory change.     Physical Exam: Vitals:   09/05/17 1544  BP: (!) 150/73  Pulse: 90  Temp: (!) 97.5 F (36.4 C)  TempSrc: Oral  SpO2: 99%  Weight: 180 lb 8 oz (81.9 kg)  Height: 5\' 7"  (1.702 m)   GENERAL- alert, co-operative, NAD HEENT- Oral mucosa appears moist CARDIAC- RRR, no murmurs, rubs or gallops. RESP- CTAB, no wheezes or crackles. NEURO- Sensation in feet is grossly intact but reports tingling, burning sensation in toes EXTREMITIES- pulse 2+, nails are thickened with discoloration, callus present under the 5th MTP joint on both feet, with small callus forming laterally at 5th  DIP on left      SKIN- Warm, dry, No rash or lesion. PSYCH- Normal mood and affect, appropriate thought content and speech.    Assessment & Plan:   See encounters tab for problem based medical decision making.   Patient discussed with Dr. Daryll Drown

## 2017-09-06 ENCOUNTER — Telehealth: Payer: Self-pay | Admitting: Internal Medicine

## 2017-09-06 NOTE — Telephone Encounter (Signed)
Patient requesting refills on acid reflex

## 2017-09-07 NOTE — Assessment & Plan Note (Signed)
She also reports increased heartburn symptoms with sharp, retrosternal pain. She does not know if this correlates with eating, before or after. She used to take pantoprazole for acid reflux but has stopping doing this since she did not need it. This sensation is sometimes associated with nonproductive coughing. She denies any nausea, vomiting, radiation of pain. Plan Restart pantoprazole for suspected GERD related symptoms

## 2017-09-07 NOTE — Assessment & Plan Note (Signed)
She has bilateral hypertrophic and darkened toenails. There is no surrounding erythema or skin changes. It is not clear to me that she has an active fungal infection. However she has reported diabetic neuropathy and also has callus formation in pressure sensitive areas. She would probably benefit from evaluation and recommendations by podiatry, though I do not see a strong indication to start medications at this time. Plan Referral to podiatry

## 2017-09-07 NOTE — Assessment & Plan Note (Signed)
She has significant but nonobstructive cerumen bilaterally. I do not see obvious erythema or lesions in her external ear canals. TMs are intact and she has no hearing changes. She denies instrumentation with Qtips or otherwise. Plan Start carbamide peroxide drops BID in each ear for at least 5-7 days

## 2017-09-08 NOTE — Progress Notes (Signed)
Internal Medicine Clinic Attending  Case discussed with Dr. Rice at the time of the visit.  We reviewed the resident's history and exam and pertinent patient test results.  I agree with the assessment, diagnosis, and plan of care documented in the resident's note.  

## 2017-09-09 NOTE — Telephone Encounter (Signed)
Per patient she is not using the pharmacy in [place]. Needs to speak with a nurse. Please call back.

## 2017-09-09 NOTE — Telephone Encounter (Signed)
Lm for rtc 

## 2017-09-23 ENCOUNTER — Encounter: Payer: Self-pay | Admitting: Podiatry

## 2017-09-23 ENCOUNTER — Ambulatory Visit: Payer: Medicaid Other | Admitting: Podiatry

## 2017-09-23 DIAGNOSIS — M79675 Pain in left toe(s): Secondary | ICD-10-CM

## 2017-09-23 DIAGNOSIS — B351 Tinea unguium: Secondary | ICD-10-CM | POA: Diagnosis not present

## 2017-09-23 DIAGNOSIS — Q828 Other specified congenital malformations of skin: Secondary | ICD-10-CM | POA: Diagnosis not present

## 2017-09-23 DIAGNOSIS — E119 Type 2 diabetes mellitus without complications: Secondary | ICD-10-CM | POA: Diagnosis not present

## 2017-09-23 DIAGNOSIS — M79674 Pain in right toe(s): Secondary | ICD-10-CM | POA: Diagnosis not present

## 2017-09-23 MED ORDER — CICLOPIROX 8 % EX SOLN: Freq: Every day | CUTANEOUS | 0 refills | Status: DC

## 2017-09-26 NOTE — Progress Notes (Signed)
Subjective: 61 y.o. returns the office today for painful, elongated, thickened toenails which she cannot trim herself. Denies any redness or drainage around the nails.  Also states that she was callus in the ball of her left foot which is very painful which causes her to walk differently.  Denies any redness or drainage or any swelling.  Denies any acute changes since last appointment and no new complaints today. Denies any systemic complaints such as fevers, chills, nausea, vomiting.   PCP: Katherine Roan, MD  Objective: AAO 3, NAD DP/PT pulses 2/4 palpable, CRT less than 3 seconds Nails hypertrophic, dystrophic, elongated, brittle, discolored 10. There is tenderness overlying the nails 1-5 bilaterally. There is no surrounding erythema or drainage along the nail sites. Hyperkeratotic lesion present left submetatarsal area.  Upon debridement there is no underlying ulceration, drainage or any signs of infection. No open lesions or pre-ulcerative lesions are identified. No other areas of tenderness bilateral lower extremities. No overlying edema, erythema, increased warmth. No pain with calf compression, swelling, warmth, erythema.  Assessment: Patient presents with symptomatic onychomycosis; hyperkeratotic lesion  Plan: -Treatment options including alternatives, risks, complications were discussed -Nails sharply debrided 10 without complication/bleeding. -Hyperkeratotic lesion sharply debrided x1 without any complications or bleeding -Discussed daily foot inspection. If there are any changes, to call the office immediately.  -Follow-up in 3 months or sooner if any problems are to arise. In the meantime, encouraged to call the office with any questions, concerns, changes symptoms.  Celesta Gentile, DPM

## 2017-09-27 ENCOUNTER — Other Ambulatory Visit: Payer: Self-pay | Admitting: Oncology

## 2017-09-27 DIAGNOSIS — E118 Type 2 diabetes mellitus with unspecified complications: Secondary | ICD-10-CM

## 2017-10-03 ENCOUNTER — Other Ambulatory Visit: Payer: Self-pay | Admitting: Internal Medicine

## 2017-10-03 ENCOUNTER — Telehealth: Payer: Self-pay | Admitting: Dietician

## 2017-10-03 DIAGNOSIS — E1142 Type 2 diabetes mellitus with diabetic polyneuropathy: Secondary | ICD-10-CM

## 2017-10-03 MED ORDER — ACCU-CHEK GUIDE W/DEVICE KIT: 1.0000 | PACK | Freq: Every day | 0 refills | Status: DC

## 2017-10-03 MED ORDER — ACCU-CHEK FASTCLIX LANCETS MISC: 3 refills | Status: DC

## 2017-10-03 MED ORDER — GLUCOSE BLOOD VI STRP: ORAL_STRIP | 5 refills | Status: DC

## 2017-10-03 NOTE — Telephone Encounter (Signed)
Bridget Owens does not want to use [facility] anymore. She wants a new meter and supplies to be sent to [facility] on [address].(Her current meter is no longer supported by [employer])

## 2017-10-03 NOTE — Telephone Encounter (Signed)
No answer at [facility] in [place]

## 2017-10-11 ENCOUNTER — Encounter: Payer: Self-pay | Admitting: Internal Medicine

## 2017-10-11 ENCOUNTER — Ambulatory Visit (INDEPENDENT_AMBULATORY_CARE_PROVIDER_SITE_OTHER): Payer: Medicaid Other | Admitting: Internal Medicine

## 2017-10-11 ENCOUNTER — Other Ambulatory Visit: Payer: Self-pay

## 2017-10-11 ENCOUNTER — Encounter (INDEPENDENT_AMBULATORY_CARE_PROVIDER_SITE_OTHER): Payer: Self-pay

## 2017-10-11 VITALS — BP 153/82 | HR 82 | Temp 97.5°F | Ht 67.0 in | Wt 178.7 lb

## 2017-10-11 DIAGNOSIS — J069 Acute upper respiratory infection, unspecified: Secondary | ICD-10-CM | POA: Diagnosis not present

## 2017-10-11 HISTORY — DX: Acute upper respiratory infection, unspecified: J06.9

## 2017-10-11 MED ORDER — MOMETASONE FUROATE 50 MCG/ACT NA SUSP: 2.0000 | Freq: Every day | NASAL | 0 refills | Status: DC

## 2017-10-11 MED ORDER — BENZONATATE 100 MG PO CAPS: 100.0000 mg | ORAL_CAPSULE | Freq: Three times a day (TID) | ORAL | 0 refills | Status: DC | PRN

## 2017-10-11 MED ORDER — ALBUTEROL SULFATE HFA 108 (90 BASE) MCG/ACT IN AERS: 1.0000 | INHALATION_SPRAY | Freq: Four times a day (QID) | RESPIRATORY_TRACT | 0 refills | Status: DC | PRN

## 2017-10-11 NOTE — Progress Notes (Signed)
   CC: Chest and head congestion, cough  HPI:  [name] is a 61 y.o. female with history noted below that presents to the acute care clinic for progressively worsening chest and head congestion along with productive cough of white sputum. She denies fever/chills, shortness of breath or chest pain. She states that her grandchildren had a cold a week ago and has spread throughout the family. She has tried Sudafed and Mucinex with little benefit.  Past Medical History:  Diagnosis Date  . Adenomatous colon polyp 6/09   Resected on colonoscopy, no high grade dysplasia.   . Allergic rhinitis   . Bell's palsy 01/2009   Left sided  . Chest pain    Exercise stress test negative 6/07  . Colon polyps    03/22/2008: adenomatous polyps x3 w no dysplasia. Needs repeat colonoscopy in 3 years  . Diabetic peripheral neuropathy ([place])   . External hemorrhoid   . GERD (gastroesophageal reflux disease)   . Hyperlipidemia   . Hypertension   . Hypertensive retinopathy    Followed by Dr. Herbert Deaner.  . Insomnia   . Intolerance, drug    Leg cramps on Lipitor  . Lipoma 12/11   Posterior neck.   . Osteoarthritis    Carpometacarpal joint of right thumb  . Postmenopausal bleeding 9/05   Endometrial biopsy showed  FOCAL TUBAL METAPLASIA   . Postmenopausal symptoms    Hot flashes, vaginal dryness, iritability, difficulty sleeping. as of 2005.  Marland Kitchen Sebaceous cyst of breast    Has been refered to derm.  . Trigger finger    Right thumb  . Type II diabetes mellitus ([place])   . Uterine fibroid     Review of Systems:  Review of Systems  Constitutional: Positive for malaise/fatigue. Negative for chills and fever.  HENT: Negative for sore throat.   Respiratory: Positive for cough. Negative for shortness of breath.   Cardiovascular: Negative for chest pain.  Gastrointestinal: Negative for nausea and vomiting.     Physical Exam:  Vitals:   10/11/17 1025  BP: (!) 153/82  Pulse: 82  Temp: (!) 97.5 F  (36.4 C)  TempSrc: Oral  SpO2: 99%  Weight: 178 lb 11.2 oz (81.1 kg)  Height: 5\' 7"  (1.702 m)   Physical Exam  Constitutional: She is well-developed, well-nourished, and in no distress.  Cardiovascular: Normal rate, regular rhythm and normal heart sounds. Exam reveals no gallop and no friction rub.  No murmur heard. Pulmonary/Chest: Effort normal and breath sounds normal. No respiratory distress. She has no wheezes. She has no rales.     Assessment & Plan:   See encounters tab for problem based medical decision making.   Patient discussed with Dr. Daryll Drown

## 2017-10-11 NOTE — Patient Instructions (Addendum)
Bridget Owens,  It was a pleasure meeting you today. For your cold please start taking Tessalon Perles, Nasonex and use an albuterol inhaler. You may use over-the-counter Coricidin for cough suppressant.

## 2017-10-11 NOTE — Assessment & Plan Note (Signed)
Assessment:  Likely viral Upper respiratory infection Patient presents with URI symptoms. Patient is afebrile and satting well on room air.  She has a productive cough with white sputum.  Recommended patient stop taking Sudafed as her blood pressure is elevated at 153/82. Prescribed Tessalon Perles, Nasonex and albuterol inhaler. Recommended taking Coricidin for additional cough suppression.  Plan -Tessalon Perles, Nasonex and albuterol inhaler -Recommended over-the-counter Coricidin

## 2017-10-14 NOTE — Addendum Note (Signed)
Addended by: Gilles Chiquito B on: 10/14/2017 09:36 AM   Modules accepted: Level of Service

## 2017-10-14 NOTE — Progress Notes (Signed)
Internal Medicine Clinic Attending  Case discussed with Dr. Hoffman at the time of the visit.  We reviewed the resident's history and exam and pertinent patient test results.  I agree with the assessment, diagnosis, and plan of care documented in the resident's note.  

## 2017-10-17 ENCOUNTER — Telehealth: Payer: Self-pay | Admitting: *Deleted

## 2017-10-17 NOTE — Telephone Encounter (Addendum)
Information was sent to [facility] Tracks for PA for Mometasone 50 mcg Nasal Spray.  Call to patient to see outcomes of previous  nasal sprays.  Patient stated they burned her nose so she stopped using.  Confirmation number is [id number].  Will await decision from [place].  Sander Nephew, RN 10/17/2017 2:39 PM. PA for for Mometasone Nasal Spray was approved 10/17/2017 thru 10/17/2018.  Sander Nephew, RN 10/18/2017 1:42 PM

## 2017-10-29 ENCOUNTER — Telehealth: Payer: Self-pay | Admitting: Dietician

## 2017-10-29 ENCOUNTER — Encounter: Payer: Self-pay | Admitting: Dietician

## 2017-10-29 NOTE — Telephone Encounter (Signed)
Bridget Owens wanted help getting more test strips. She used all 50 this month already because she was having trouble using the meter. I explained she should be okay not checking her blood a sugar for a few days ( she thought was was taking insulin; educated her that Byetta is not insulin and should not cause a low blood sugar) . I encouraged her to call the pharmacy and ask them what day she can get more strips. She verbalized understanding.

## 2017-10-31 ENCOUNTER — Encounter: Payer: Self-pay | Admitting: Dietician

## 2017-10-31 ENCOUNTER — Telehealth: Payer: Self-pay | Admitting: Dietician

## 2017-10-31 NOTE — Telephone Encounter (Addendum)
[name] is calling again asking for more strips to check her blood sugar. She ran out early because she says she was told to check her blood sugar 3 times a day and she wants to check it 3x/day so she knows how to eat or if her sugar is low.  I reiterated that Byetta is not insulin and that she is at low risk of low blood sugar. The last time we have meter readings from her here in the office was 11/01/2015- 2 years ago.  I told her that I would ask you how frequently you'd like her to check her blood sugar and if you want her to check more often than 1x/day then we'd send in a new prescription. Marland Kitchen

## 2017-10-31 NOTE — Telephone Encounter (Signed)
1x per day is fine, she can alternate when she tests each day.  Day 1 test fasting before breakfast, Day 2 test 2 hours after lunch, day 3 test before bedtime, then repeat and keep a log

## 2017-11-01 ENCOUNTER — Encounter: Payer: Self-pay | Admitting: Dietician

## 2017-11-01 NOTE — Telephone Encounter (Signed)
Called Ms. Ury and explained to her how Dr. Shan Levans wants her to check her blood sugar. She repeated back understanding. I am also mailing her a logbook with the days and tine highlighted in pink.

## 2017-11-04 ENCOUNTER — Other Ambulatory Visit: Payer: Self-pay | Admitting: Internal Medicine

## 2017-11-04 DIAGNOSIS — E1142 Type 2 diabetes mellitus with diabetic polyneuropathy: Secondary | ICD-10-CM

## 2017-11-06 NOTE — Telephone Encounter (Signed)
Refilled

## 2017-11-08 ENCOUNTER — Ambulatory Visit: Payer: Medicaid Other

## 2017-11-11 ENCOUNTER — Ambulatory Visit: Payer: Medicaid Other

## 2017-11-11 ENCOUNTER — Encounter: Payer: Self-pay | Admitting: Internal Medicine

## 2017-11-23 ENCOUNTER — Other Ambulatory Visit: Payer: Self-pay | Admitting: Internal Medicine

## 2017-11-23 DIAGNOSIS — E1142 Type 2 diabetes mellitus with diabetic polyneuropathy: Secondary | ICD-10-CM

## 2017-11-25 NOTE — Telephone Encounter (Signed)
refilled 

## 2017-11-26 ENCOUNTER — Other Ambulatory Visit: Payer: Self-pay | Admitting: Internal Medicine

## 2017-11-26 NOTE — Telephone Encounter (Signed)
NEEDS REFILL ON MEDICATION FOR DIABETES

## 2017-11-26 NOTE — Telephone Encounter (Signed)
This has been done, informed pt

## 2017-12-03 ENCOUNTER — Other Ambulatory Visit: Payer: Self-pay | Admitting: Internal Medicine

## 2017-12-03 DIAGNOSIS — K219 Gastro-esophageal reflux disease without esophagitis: Secondary | ICD-10-CM

## 2017-12-03 NOTE — Telephone Encounter (Signed)
refilled 

## 2017-12-16 ENCOUNTER — Other Ambulatory Visit: Payer: Self-pay | Admitting: *Deleted

## 2017-12-16 MED ORDER — EXENATIDE 5 MCG/0.02ML ~~LOC~~ SOPN: 5.0000 ug | PEN_INJECTOR | Freq: Two times a day (BID) | SUBCUTANEOUS | 2 refills | Status: DC

## 2017-12-16 NOTE — Telephone Encounter (Signed)
refilled 

## 2017-12-18 ENCOUNTER — Ambulatory Visit: Payer: Medicaid Other

## 2017-12-23 ENCOUNTER — Ambulatory Visit: Payer: Medicaid Other | Admitting: Podiatry

## 2017-12-27 ENCOUNTER — Other Ambulatory Visit: Payer: Self-pay | Admitting: Internal Medicine

## 2017-12-27 DIAGNOSIS — I10 Essential (primary) hypertension: Secondary | ICD-10-CM

## 2017-12-27 NOTE — Telephone Encounter (Signed)
Will refill a short course.  Pt canceled last appt, needs repeat labs and possibly adjust regimen.

## 2017-12-30 NOTE — Telephone Encounter (Signed)
Please re-schedule pt's appt per Dr Shan Levans. Thanks

## 2018-01-03 ENCOUNTER — Ambulatory Visit: Payer: Medicaid Other | Admitting: Podiatry

## 2018-01-03 ENCOUNTER — Telehealth: Payer: Self-pay

## 2018-01-03 NOTE — Telephone Encounter (Signed)
Olmesartan-amLODIPine-HCTZ 40-5-25 MG TABS, is not at the pharmacy. Please call pt back.

## 2018-01-03 NOTE — Telephone Encounter (Signed)
I would prefer for her to come to clinic for a quick BP appointment.  Depending on how high her bp is she can get a temporary script for a different BP med to hold her over until prior authorization.

## 2018-01-03 NOTE — Telephone Encounter (Signed)
Pt states she has developed a h/a from not taking this med, do you want to prescribe something else or go on with PA

## 2018-01-03 NOTE — Telephone Encounter (Signed)
This med needs PA [facility], sending to [place].

## 2018-01-07 ENCOUNTER — Ambulatory Visit: Payer: Medicaid Other | Admitting: Internal Medicine

## 2018-01-07 ENCOUNTER — Other Ambulatory Visit: Payer: Self-pay

## 2018-01-07 ENCOUNTER — Encounter: Payer: Self-pay | Admitting: Internal Medicine

## 2018-01-07 DIAGNOSIS — Z8701 Personal history of pneumonia (recurrent): Secondary | ICD-10-CM | POA: Diagnosis not present

## 2018-01-07 DIAGNOSIS — I10 Essential (primary) hypertension: Secondary | ICD-10-CM | POA: Diagnosis not present

## 2018-01-07 DIAGNOSIS — Z79899 Other long term (current) drug therapy: Secondary | ICD-10-CM

## 2018-01-07 DIAGNOSIS — F17210 Nicotine dependence, cigarettes, uncomplicated: Secondary | ICD-10-CM

## 2018-01-07 DIAGNOSIS — R51 Headache: Secondary | ICD-10-CM

## 2018-01-07 DIAGNOSIS — E785 Hyperlipidemia, unspecified: Secondary | ICD-10-CM | POA: Diagnosis not present

## 2018-01-07 DIAGNOSIS — N951 Menopausal and female climacteric states: Secondary | ICD-10-CM | POA: Diagnosis not present

## 2018-01-07 DIAGNOSIS — E119 Type 2 diabetes mellitus without complications: Secondary | ICD-10-CM

## 2018-01-07 DIAGNOSIS — J8417 Other interstitial pulmonary diseases with fibrosis in diseases classified elsewhere: Secondary | ICD-10-CM

## 2018-01-07 MED ORDER — AMLODIPINE BESYLATE 5 MG PO TABS: 5.0000 mg | ORAL_TABLET | Freq: Every day | ORAL | 0 refills | Status: DC

## 2018-01-07 MED ORDER — HYDROCHLOROTHIAZIDE 25 MG PO TABS: 25.0000 mg | ORAL_TABLET | Freq: Every day | ORAL | 0 refills | Status: DC

## 2018-01-07 MED ORDER — OLMESARTAN MEDOXOMIL 40 MG PO TABS: 40.0000 mg | ORAL_TABLET | Freq: Every day | ORAL | 0 refills | Status: DC

## 2018-01-07 MED ORDER — GABAPENTIN 300 MG PO CAPS: 300.0000 mg | ORAL_CAPSULE | Freq: Every day | ORAL | 0 refills | Status: DC

## 2018-01-07 NOTE — Progress Notes (Signed)
   CC: management of HTN  HPI:  [name] is a 62 y.o. 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  She is on olmesartan-amlodipine-HCTZ 40-5-25mg  daily, however she has not been able to pick this medication up since her insurance requires prior auth. Since not having the medication, she endorses a HA.  She has not been taking her antihypertensive for the last 2 weeks. She endorses HA on the top of her head for the last 2 weeks. Denies blurry vision, chest pain, shortness of breath, or associated nausea or vomiting. She drinks a cup of coffee every day. Has not taken anything for the pain.  She also endorses hot flashes for the last 13 years that started at the onset of menopause. She has not tried anything for the hot flashes. The symptoms are bothersome, particularly at night. She states she frequently has to change her clothes due to sweating through them.  Still smoking about half a pack per day.  Please see the assessment and plan below for the status of the patient's chronic medical problems.  Past Medical History:  Diagnosis Date  . Adenomatous colon polyp 6/09   Resected on colonoscopy, no high grade dysplasia.   . Allergic rhinitis   . Bell's palsy 01/2009   Left sided  . Chest pain    Exercise stress test negative 6/07  . Colon polyps    03/22/2008: adenomatous polyps x3 w no dysplasia. Needs repeat colonoscopy in 3 years  . Diabetic peripheral neuropathy ([place])   . External hemorrhoid   . GERD (gastroesophageal reflux disease)   . Hyperlipidemia   . Hypertension   . Hypertensive retinopathy    Followed by Dr. Herbert Deaner.  . Insomnia   . Intolerance, drug    Leg cramps on Lipitor  . Lipoma 12/11   Posterior neck.   . Osteoarthritis    Carpometacarpal joint of right thumb  . Postmenopausal bleeding 9/05   Endometrial biopsy showed  FOCAL TUBAL METAPLASIA   . Postmenopausal  symptoms    Hot flashes, vaginal dryness, iritability, difficulty sleeping. as of 2005.  Marland Kitchen Sebaceous cyst of breast    Has been refered to derm.  . Trigger finger    Right thumb  . Type II diabetes mellitus ([place])   . Uterine fibroid    Review of Systems:   GEN: Endorses hot flashes, particularly at night EYES: No blurry vision NEURO: No focal numbness or weakness CV: No chest pain PULM: No shortness of breath ABD: No abdominal pain, N/V  Physical Exam:  Vitals:   01/07/18 1018 01/07/18 1104  BP: (!) 162/78 (!) 156/84  Pulse: 80   Temp: 97.6 F (36.4 C)   TempSrc: Oral   SpO2: 99%   Weight: 184 lb 6.4 oz (83.6 kg)   Height: 5\' 7"  (1.702 m)    GEN: Sitting in chair comfortably in NAD EYES: PERRL CV: NR & RR, no m/r/g PULM: CTAB, no wheezes or rales EXT: No LE edema  Assessment & Plan:   See Encounters Tab for problem based charting.  Patient discussed with Dr. Lynnae January

## 2018-01-07 NOTE — Patient Instructions (Addendum)
FOLLOW-UP INSTRUCTIONS When: 02/17/2018 (appointment already scheduled) For: BP management What to bring: medications   Bridget Owens,  It was a pleasure to meet you today.  For your blood pressure, please start taking: - Olmesartan 40mg  once a day - Amlodipine 5mg  once a day - HCTZ 25mg  once a day  For your hot flashes, please try: - Gabapentin 300mg  once at night  Please continue to work on stopping smoking. This will help with your overall health, lungs, and heart.  Please return on 02/17/2018 to see your primary doctor.

## 2018-01-07 NOTE — Telephone Encounter (Signed)
appt 4/2 [facility]

## 2018-01-07 NOTE — Assessment & Plan Note (Addendum)
Assessment Endorses hot flashes for the last 13 years, which started at the onset of menopause. The hot flashes are particularly bad at night, and she reports that she often soaks through her clothes. Symptoms seem bothersome to her daily life.  Plan - Trial gabapentin 300mg  QHS - F/u with PCP on 02/17/2018 - Can consider paroxetine/SNRI if no improvement with gabapentin

## 2018-01-07 NOTE — Progress Notes (Signed)
Internal Medicine Clinic Attending  Case discussed with Dr. Huang at the time of the visit.  We reviewed the resident's history and exam and pertinent patient test results.  I agree with the assessment, diagnosis, and plan of care documented in the resident's note. 

## 2018-01-07 NOTE — Assessment & Plan Note (Addendum)
Assessment BP today is 162/78, HR 80. Repeat 156/84. She had been on olmesartan-amlodipine-HCTZ 40-5-25mg  daily with sBP in 140s-150s range, however her insurance requires prior auth and she has not been able to take this medication in the last 2 weeks. Since not having the medication, she endorses a headache. No blurry vision, chest pain, or shortness of breath. Will send separate prescriptions for anti-hypertensives  Plan - Olmesartan 40mg  daily - Amlodipine 5mg  daily - HCTZ 25mg  daily - F/u with PCP on 02/17/2018

## 2018-01-07 NOTE — Assessment & Plan Note (Signed)
Assessment Was smoking 1.5ppd, now smoking 0.5ppd. She is interested in trying to quit. We discussed continuing to decrease the number of cigarettes she smokes daily.  Plan - Counseled on smoking cessation

## 2018-01-23 ENCOUNTER — Other Ambulatory Visit: Payer: Self-pay | Admitting: Oncology

## 2018-01-23 DIAGNOSIS — E1142 Type 2 diabetes mellitus with diabetic polyneuropathy: Secondary | ICD-10-CM

## 2018-01-31 ENCOUNTER — Ambulatory Visit: Payer: Medicaid Other | Admitting: Podiatry

## 2018-01-31 ENCOUNTER — Encounter: Payer: Self-pay | Admitting: Podiatry

## 2018-01-31 DIAGNOSIS — M79675 Pain in left toe(s): Secondary | ICD-10-CM | POA: Diagnosis not present

## 2018-01-31 DIAGNOSIS — M216X2 Other acquired deformities of left foot: Secondary | ICD-10-CM | POA: Diagnosis not present

## 2018-01-31 DIAGNOSIS — B351 Tinea unguium: Secondary | ICD-10-CM

## 2018-01-31 DIAGNOSIS — Q828 Other specified congenital malformations of skin: Secondary | ICD-10-CM

## 2018-01-31 DIAGNOSIS — M79674 Pain in right toe(s): Secondary | ICD-10-CM | POA: Diagnosis not present

## 2018-01-31 DIAGNOSIS — E1142 Type 2 diabetes mellitus with diabetic polyneuropathy: Secondary | ICD-10-CM | POA: Diagnosis not present

## 2018-01-31 NOTE — Progress Notes (Addendum)
Complaint:  Visit Type: Patient returns to my office for continued preventative foot care services. Complaint: Patient states" my nails have grown long and thick and become painful to walk and wear shoes" Patient has been diagnosed with DM with no foot complications. The patient presents for preventative foot care services. No changes to ROS.  Patient also has painful callus on the outside bottom of left foot she would like to go away. Podiatric Exam: Vascular: dorsalis pedis and posterior tibial pulses are palpable bilateral. Capillary return is immediate. Temperature gradient is WNL. Skin turgor WNL  Sensorium: Normal Semmes Weinstein monofilament test. Normal tactile sensation bilaterally. Nail Exam: Pt has thick disfigured discolored nails with subungual debris noted bilateral entire nail hallux through fifth toenails Ulcer Exam: There is no evidence of ulcer or pre-ulcerative changes or infection. Orthopedic Exam: Muscle tone and strength are WNL. No limitations in general ROM. No crepitus or effusions noted. Foot type and digits show no abnormalities. Bony prominences are unremarkable. Skin: No Porokeratosis. No infection or ulcers  Diagnosis:  Onychomycosis, , Pain in right toe, pain in left toes  Porokeratosis left foot.  Treatment & Plan Procedures and Treatment: Consent by patient was obtained for treatment procedures.   Debridement of mycotic and hypertrophic toenails, 1 through 5 bilateral and clearing of subungual debris. No ulceration, no infection noted. Consider surgical correction fifth metatarsal. Debride porokeratosis  Left foot.  Medicaid ABN signed for 2019. Return Visit-Office Procedure: Patient instructed to return to the office for a follow up visit 3 months for continued evaluation and treatment.    Gardiner Barefoot DPM

## 2018-02-10 ENCOUNTER — Telehealth: Payer: Self-pay

## 2018-02-10 NOTE — Progress Notes (Signed)
Bridget Owens is a 62 y.o. female who was contacted on behalf of [facility] Geriatrics Task Force.   Patient kept phone call brief and stated she did not need help with her medications.   HCTZ and amlodipine are out of refills. Last filled 01/2018.   Called [facility]. Olmesartan 40 mg was never filled. Combination (olmesartan-amlodipine-HCTZ) was last filled 09/2017. Risk of duplicate medication.   Please consider medication rec. Refer to clinical pharmacist if assistance is needed.

## 2018-02-13 ENCOUNTER — Encounter: Payer: Medicaid Other | Admitting: Internal Medicine

## 2018-02-17 ENCOUNTER — Encounter: Payer: Self-pay | Admitting: Internal Medicine

## 2018-02-17 ENCOUNTER — Encounter: Payer: Medicaid Other | Admitting: Internal Medicine

## 2018-02-18 ENCOUNTER — Other Ambulatory Visit: Payer: Self-pay | Admitting: Pharmacist

## 2018-02-18 DIAGNOSIS — I10 Essential (primary) hypertension: Secondary | ICD-10-CM

## 2018-02-18 DIAGNOSIS — F17210 Nicotine dependence, cigarettes, uncomplicated: Secondary | ICD-10-CM

## 2018-02-18 MED ORDER — HYDROCHLOROTHIAZIDE 25 MG PO TABS: 25.0000 mg | ORAL_TABLET | Freq: Every day | ORAL | 0 refills | Status: DC

## 2018-02-18 MED ORDER — AMLODIPINE BESYLATE 5 MG PO TABS: 5.0000 mg | ORAL_TABLET | Freq: Every day | ORAL | 0 refills | Status: DC

## 2018-02-18 NOTE — Telephone Encounter (Signed)
refilled 

## 2018-02-26 ENCOUNTER — Ambulatory Visit: Payer: Medicaid Other | Admitting: Internal Medicine

## 2018-02-26 VITALS — BP 174/74 | HR 90 | Temp 98.0°F | Ht 67.0 in | Wt 184.5 lb

## 2018-02-26 DIAGNOSIS — F17210 Nicotine dependence, cigarettes, uncomplicated: Secondary | ICD-10-CM | POA: Diagnosis not present

## 2018-02-26 DIAGNOSIS — Z79899 Other long term (current) drug therapy: Secondary | ICD-10-CM | POA: Diagnosis not present

## 2018-02-26 DIAGNOSIS — Z9189 Other specified personal risk factors, not elsewhere classified: Secondary | ICD-10-CM

## 2018-02-26 DIAGNOSIS — J302 Other seasonal allergic rhinitis: Secondary | ICD-10-CM

## 2018-02-26 DIAGNOSIS — I10 Essential (primary) hypertension: Secondary | ICD-10-CM | POA: Diagnosis not present

## 2018-02-26 MED ORDER — FLUTICASONE PROPIONATE 50 MCG/ACT NA SUSP: 1.0000 | Freq: Every day | NASAL | 0 refills | Status: DC

## 2018-02-26 MED ORDER — DM-GUAIFENESIN ER 30-600 MG PO TB12: 1.0000 | ORAL_TABLET | Freq: Two times a day (BID) | ORAL | 0 refills | Status: DC

## 2018-02-26 MED ORDER — DIPHENHYDRAMINE HCL 25 MG PO CAPS: 25.0000 mg | ORAL_CAPSULE | Freq: Every evening | ORAL | 0 refills | Status: DC

## 2018-02-26 NOTE — Patient Instructions (Addendum)
Your cough is likely caused by your runny nose.  I have prescribed you a nasal spray, and some cough medicine. Continue taking the over the counter benadryl.  Additionally please come back next week to meet with our pharmacist and go over your medications.  Please bring all your medications at that visit.   Allergic Rhinitis, Adult Allergic rhinitis is an allergic reaction that affects the mucous membrane inside the nose. It causes sneezing, a runny or stuffy nose, and the feeling of mucus going down the back of the throat (postnasal drip). Allergic rhinitis can be mild to severe. There are two types of allergic rhinitis:  Seasonal. This type is also called hay fever. It happens only during certain seasons.  Perennial. This type can happen at any time of the year.  What are the causes? This condition happens when the body's defense system (immune system) responds to certain harmless substances called allergens as though they were germs.  Seasonal allergic rhinitis is triggered by pollen, which can come from grasses, trees, and weeds. Perennial allergic rhinitis may be caused by:  House dust mites.  Pet dander.  Mold spores.  What are the signs or symptoms? Symptoms of this condition include:  Sneezing.  Runny or stuffy nose (nasal congestion).  Postnasal drip.  Itchy nose.  Tearing of the eyes.  Trouble sleeping.  Daytime sleepiness.  How is this diagnosed? This condition may be diagnosed based on:  Your medical history.  A physical exam.  Tests to check for related conditions, such as: ? Asthma. ? Pink eye. ? Ear infection. ? Upper respiratory infection.  Tests to find out which allergens trigger your symptoms. These may include skin or blood tests.  How is this treated? There is no cure for this condition, but treatment can help control symptoms. Treatment may include:  Taking medicines that block allergy symptoms, such as antihistamines. Medicine may be  given as a shot, nasal spray, or pill.  Avoiding the allergen.  Desensitization. This treatment involves getting ongoing shots until your body becomes less sensitive to the allergen. This treatment may be done if other treatments do not help.  If taking medicine and avoiding the allergen does not work, new, stronger medicines may be prescribed.  Follow these instructions at home:  Find out what you are allergic to. Common allergens include smoke, dust, and pollen.  Avoid the things you are allergic to. These are some things you can do to help avoid allergens: ? Replace carpet with wood, tile, or vinyl flooring. Carpet can trap dander and dust. ? Do not smoke. Do not allow smoking in your home. ? Change your heating and air conditioning filter at least once a month. ? During allergy season:  Keep windows closed as much as possible.  Plan outdoor activities when pollen counts are lowest. This is usually during the evening hours.  When coming indoors, change clothing and shower before sitting on furniture or bedding.  Take over-the-counter and prescription medicines only as told by your health care provider.  Keep all follow-up visits as told by your health care provider. This is important. Contact a health care provider if:  You have a fever.  You develop a persistent cough.  You make whistling sounds when you breathe (you wheeze).  Your symptoms interfere with your normal daily activities. Get help right away if:  You have shortness of breath. Summary  This condition can be managed by taking medicines as directed and avoiding allergens.  Contact your health  care provider if you develop a persistent cough or fever.  During allergy season, keep windows closed as much as possible. This information is not intended to replace advice given to you by your health care provider. Make sure you discuss any questions you have with your health care provider. Document Released:  06/19/2001 Document Revised: 11/01/2016 Document Reviewed: 11/01/2016 Elsevier Interactive Patient Education  [employer].

## 2018-02-26 NOTE — Progress Notes (Signed)
CC: Cough,   HPI:  Bridget Owens is a 62 y.o. female with PMH below she is here to address a new cough.   Please see A&P for status of the patient's chronic medical conditions  Past Medical History:  Diagnosis Date  . Adenomatous colon polyp 6/09   Resected on colonoscopy, no high grade dysplasia.   . Allergic rhinitis   . Bell's palsy 01/2009   Left sided  . Chest pain    Exercise stress test negative 6/07  . Colon polyps    03/22/2008: adenomatous polyps x3 w no dysplasia. Needs repeat colonoscopy in 3 years  . Diabetic peripheral neuropathy ([place])   . External hemorrhoid   . GERD (gastroesophageal reflux disease)   . Hyperlipidemia   . Hypertension   . Hypertensive retinopathy    Followed by Dr. Herbert Deaner.  . Insomnia   . Intolerance, drug    Leg cramps on Lipitor  . Lipoma 12/11   Posterior neck.   . Osteoarthritis    Carpometacarpal joint of right thumb  . Postmenopausal bleeding 9/05   Endometrial biopsy showed  FOCAL TUBAL METAPLASIA   . Postmenopausal symptoms    Hot flashes, vaginal dryness, iritability, difficulty sleeping. as of 2005.  Marland Kitchen Sebaceous cyst of breast    Has been refered to derm.  . Trigger finger    Right thumb  . Type II diabetes mellitus ([place])   . Uterine fibroid    Review of Systems:  ROS: Pulmonary: pt denies increased work of breathing, shortness of breath,  Cardiac: pt denies palpitations, chest pain,  Abdominal: pt denies abdominal pain, nausea, vomiting, or diarrhea  Physical Exam:  Vitals:   02/26/18 1601  BP: (!) 174/74  Pulse: 90  Temp: 98 F (36.7 C)  TempSrc: Oral  SpO2: 99%  Weight: 184 lb 8 oz (83.7 kg)  Height: 5\' 7"  (1.702 m)   Physical Exam  Constitutional:  Non-toxic appearance. She does not appear ill.  HENT:  Nose: Mucosal edema and rhinorrhea present.  Mouth/Throat: Posterior oropharyngeal erythema present.  Cardiovascular: Normal rate and regular rhythm. Exam reveals no gallop and no friction rub.  No  murmur heard. Pulmonary/Chest: Effort normal and breath sounds normal. No respiratory distress. She has no wheezes. She has no rhonchi. She has no rales.  Abdominal: Soft. Bowel sounds are normal. She exhibits no distension and no mass. There is no tenderness.  Neurological: She is alert.  Skin: No rash noted. No erythema.    Social History   Socioeconomic History  . Marital status: Single    Spouse name: Not on file  . Number of children: Not on file  . Years of education: Not on file  . Highest education level: Not on file  Occupational History  . Not on file  Social Needs  . Financial resource strain: Not on file  . Food insecurity:    Worry: Not on file    Inability: Not on file  . Transportation needs:    Medical: Not on file    Non-medical: Not on file  Tobacco Use  . Smoking status: Current Some Day Smoker    Packs/day: 0.50    Years: 30.00    Pack years: 15.00    Types: Cigarettes  . Smokeless tobacco: Former Systems developer    Quit date: 11/28/2010  . Tobacco comment: 9-10 cigerettes  Substance and Sexual Activity  . Alcohol use: No    Alcohol/week: 0.0 oz  . Drug use: No  .  Sexual activity: Not on file  Lifestyle  . Physical activity:    Days per week: Not on file    Minutes per session: Not on file  . Stress: Not on file  Relationships  . Social connections:    Talks on phone: Not on file    Gets together: Not on file    Attends religious service: Not on file    Active member of club or organization: Not on file    Attends meetings of clubs or organizations: Not on file    Relationship status: Not on file  . Intimate partner violence:    Fear of current or ex partner: Not on file    Emotionally abused: Not on file    Physically abused: Not on file    Forced sexual activity: Not on file  Other Topics Concern  . Not on file  Social History Narrative  . Not on file    Family History  Problem Relation Age of Onset  . Pneumonia Mother   . Diabetes Mother     . Early death Father   . Diabetes Sister   . Diabetes Brother   . Diabetes Maternal Grandmother     Assessment & Plan:   See Encounters Tab for problem based charting.  Patient discussed with Dr. Dareen Piano

## 2018-02-27 ENCOUNTER — Telehealth: Payer: Self-pay | Admitting: Internal Medicine

## 2018-02-27 DIAGNOSIS — Z9189 Other specified personal risk factors, not elsewhere classified: Secondary | ICD-10-CM | POA: Insufficient documentation

## 2018-02-27 HISTORY — DX: Other specified personal risk factors, not elsewhere classified: Z91.89

## 2018-02-27 MED ORDER — IBUPROFEN 600 MG PO TABS: ORAL_TABLET | ORAL | 2 refills | Status: DC

## 2018-02-27 NOTE — Telephone Encounter (Signed)
Refill Request  Pt is requesting some Ibuprofen

## 2018-02-27 NOTE — Telephone Encounter (Signed)
No problem, thought she was taking that over the counter, sorry about that.  I will order it now

## 2018-02-27 NOTE — Assessment & Plan Note (Signed)
BP Readings from Last 3 Encounters:  02/26/18 (!) 174/74  01/07/18 (!) 156/84  10/11/17 (!) 153/82   Pt unsure which medications if any she is taking for blood pressure at this time.  I was alerted by geriatric task force about this patient.  She continues to miss her regularly scheduled visits, she is here only because she developed a cough.    -asked pt to please return, bring all her medications next week -referral placed to help sort out meds with pharmacist here

## 2018-02-27 NOTE — Assessment & Plan Note (Addendum)
Thursday of last week cough began put goldbond underneath breasts, breathed in powder then started coughing.  No fevers, does have runny nose.  Clear phlegm productive cough.  Sore throat.  Pt has visible drainage from sinuses and irritated oropharynx.  Suspect postnasal drip.  No fevers, chills, myalgias or arthralgias.    -mucinex dm for cough -flonase and zyrtec for drainage

## 2018-02-27 NOTE — Assessment & Plan Note (Signed)
Pt has old medicines at home, was switched from combo blood pressure pill to individual therapy.  She is at risk for polypharmacy as she takes her medications intermittently and has poor literacy.    -asked pt to please return, bring all her medications next week -referral placed to our pharmacist to help sort out meds with pharmacist here

## 2018-02-27 NOTE — Telephone Encounter (Signed)
Pt states her hips are aching bad and she needs some IBU as you had discussed

## 2018-02-27 NOTE — Telephone Encounter (Signed)
refilled 

## 2018-03-05 NOTE — Progress Notes (Signed)
Internal Medicine Clinic Attending  Case discussed with Dr. Winfrey  at the time of the visit.  We reviewed the resident's history and exam and pertinent patient test results.  I agree with the assessment, diagnosis, and plan of care documented in the resident's note.  

## 2018-03-07 ENCOUNTER — Ambulatory Visit: Payer: Medicaid Other | Admitting: Pharmacist

## 2018-03-11 ENCOUNTER — Ambulatory Visit (INDEPENDENT_AMBULATORY_CARE_PROVIDER_SITE_OTHER): Payer: Medicaid Other | Admitting: Pharmacist

## 2018-03-11 ENCOUNTER — Other Ambulatory Visit (INDEPENDENT_AMBULATORY_CARE_PROVIDER_SITE_OTHER): Payer: Medicaid Other

## 2018-03-11 VITALS — BP 157/79 | HR 103

## 2018-03-11 DIAGNOSIS — E1142 Type 2 diabetes mellitus with diabetic polyneuropathy: Secondary | ICD-10-CM | POA: Diagnosis not present

## 2018-03-11 LAB — POCT GLYCOSYLATED HEMOGLOBIN (HGB A1C): Hemoglobin A1C: 7.9 % — AB (ref 4.0–5.6)

## 2018-03-11 LAB — GLUCOSE, CAPILLARY: Glucose-Capillary: 313 mg/dL — ABNORMAL HIGH (ref 65–99)

## 2018-03-11 NOTE — Patient Instructions (Addendum)
Medication changes: - Stop taking Byetta. Start Ozempic, inject once a week. - Apply 1 nicotine patch in the morning. Use nicotine gum as needed for cravings. - You can stop taking Benadryl if you feel it is NOT helping with your cough - Use plain Mucinex (not mucinex combo with decongestants) - Continue all other medications

## 2018-03-11 NOTE — Progress Notes (Signed)
7.9%

## 2018-03-11 NOTE — Progress Notes (Signed)
See Bridget Owens's note

## 2018-03-12 ENCOUNTER — Telehealth: Payer: Self-pay | Admitting: *Deleted

## 2018-03-12 LAB — LIPID PANEL
Chol/HDL Ratio: 3.4 ratio (ref 0.0–4.4)
Cholesterol, Total: 142 mg/dL (ref 100–199)
HDL: 42 mg/dL (ref >39)
LDL Calculated: 71 mg/dL (ref 0–99)
Triglycerides: 144 mg/dL (ref 0–149)
VLDL Cholesterol Cal: 29 mg/dL (ref 5–40)

## 2018-03-12 LAB — MICROALBUMIN / CREATININE URINE RATIO
Creatinine, Urine: 21.2 mg/dL
Microalb/Creat Ratio: 16.5 mg/g creat (ref 0.0–30.0)
Microalbumin, Urine: 3.5 ug/mL

## 2018-03-12 MED ORDER — SEMAGLUTIDE(0.25 OR 0.5MG/DOS) 2 MG/1.5ML ~~LOC~~ SOPN: 0.5000 mg | PEN_INJECTOR | Freq: Every day | SUBCUTANEOUS | 3 refills | Status: DC

## 2018-03-12 NOTE — Progress Notes (Signed)
S: Bridget Owens is a 62 y.o. female reports to clinical pharmacist appointment for medication management. Patient did  bring medication bottles.   Allergies  Allergen Reactions  . Codeine Other (See Comments)    "felt funny"   Medication Sig  ACCU-CHEK FASTCLIX LANCETS MISC Use to check sugar  daily as directed  ACCU-CHEK SOFTCLIX LANCETS lancets USE TO check blood sugar 4 times daily. diag code E11.42. Insulin dependent  albuterol (PROVENTIL HFA) 108 (90 Base) MCG/ACT inhaler Inhale 1-2 puffs into the lungs every 6 (six) hours as needed for wheezing or shortness of breath.  amLODipine (NORVASC) 5 MG tablet Take 1 tablet (5 mg total) by mouth daily.  aspirin 81 MG EC tablet Take 81 mg by mouth daily.    atorvastatin (LIPITOR) 40 MG tablet TAKE 1 TABLET(40 MG) BY MOUTH DAILY  Blood Glucose Monitoring Suppl (ACCU-CHEK GUIDE) w/Device KIT 1 each by Does not apply route daily.  fluticasone (FLONASE) 50 MCG/ACT nasal spray Place 1 spray into both nostrils daily.  gabapentin (NEURONTIN) 300 MG capsule Take 1 capsule (300 mg total) by mouth at bedtime.  glimepiride (AMARYL) 4 MG tablet TAKE 1 TABLET BY MOUTH ONCE DAILY WITH BREAKFAST OR BEFORE LARGEST MEAL OF THE DAY  glucose blood (ACCU-CHEK GUIDE) test strip Check blood sugar one time a day  guaiFENesin (MUCINEX) 600 MG 12 hr tablet Take 600 mg by mouth 2 (two) times daily as needed for cough.  hydrochlorothiazide (HYDRODIURIL) 25 MG tablet Take 1 tablet (25 mg total) by mouth daily.  ibuprofen (ADVIL,MOTRIN) 600 MG tablet Can use one tablet 2-3 times per week with food for hip pain.  metFORMIN (GLUCOPHAGE) 1000 MG tablet TAKE 1 TABLET(1000 MG) BY MOUTH TWICE DAILY WITH A MEAL  pantoprazole (PROTONIX) 40 MG tablet TAKE ONE TABLET BY MOUTH EVERY DAY  ciclopirox (PENLAC) 8 % solution Apply topically at bedtime. Apply over nail and surrounding skin. Apply daily over previous coat. After seven (7) days, may remove with alcohol and continue cycle.    Past Medical History:  Diagnosis Date  . Adenomatous colon polyp 6/09   Resected on colonoscopy, no high grade dysplasia.   . Allergic rhinitis   . Bell's palsy 01/2009   Left sided  . Chest pain    Exercise stress test negative 6/07  . Colon polyps    03/22/2008: adenomatous polyps x3 w no dysplasia. Needs repeat colonoscopy in 3 years  . Diabetic peripheral neuropathy ([place])   . External hemorrhoid   . GERD (gastroesophageal reflux disease)   . Hyperlipidemia   . Hypertension   . Hypertensive retinopathy    Followed by Dr. Herbert Deaner.  . Insomnia   . Intolerance, drug    Leg cramps on Lipitor  . Lipoma 12/11   Posterior neck.   . Osteoarthritis    Carpometacarpal joint of right thumb  . Postmenopausal bleeding 9/05   Endometrial biopsy showed  FOCAL TUBAL METAPLASIA   . Postmenopausal symptoms    Hot flashes, vaginal dryness, iritability, difficulty sleeping. as of 2005.  Marland Kitchen Sebaceous cyst of breast    Has been refered to derm.  . Trigger finger    Right thumb  . Type II diabetes mellitus ([place])   . Uterine fibroid    Social History   Socioeconomic History  . Marital status: Single    Spouse name: Not on file  . Number of children: Not on file  . Years of education: Not on file  . Highest education level: Not on  file  Occupational History  . Not on file  Social Needs  . Financial resource strain: Not on file  . Food insecurity:    Worry: Not on file    Inability: Not on file  . Transportation needs:    Medical: Not on file    Non-medical: Not on file  Tobacco Use  . Smoking status: Current Some Day Smoker    Packs/day: 0.50    Years: 30.00    Pack years: 15.00    Types: Cigarettes  . Smokeless tobacco: Former Systems developer    Quit date: 11/28/2010  . Tobacco comment: 9-10 cigerettes  Substance and Sexual Activity  . Alcohol use: No    Alcohol/week: 0.0 oz  . Drug use: No  . Sexual activity: Not on file  Lifestyle  . Physical activity:    Days per week: Not on  file    Minutes per session: Not on file  . Stress: Not on file  Relationships  . Social connections:    Talks on phone: Not on file    Gets together: Not on file    Attends religious service: Not on file    Active member of club or organization: Not on file    Attends meetings of clubs or organizations: Not on file    Relationship status: Not on file  Other Topics Concern  . Not on file  Social History Narrative  . Not on file   Family History  Problem Relation Age of Onset  . Pneumonia Mother   . Diabetes Mother   . Early death Father   . Diabetes Sister   . Diabetes Brother   . Diabetes Maternal Grandmother    O: Component Value Date/Time   CHOL 142 03/11/2018 1405   HDL 42 03/11/2018 1405   TRIG 144 03/11/2018 1405   AST 16 07/16/2017 2232   ALT 17 07/16/2017 2232   NA 135 07/16/2017 2232   NA 139 08/06/2016 1546   K 3.3 (L) 07/16/2017 2232   CL 99 (L) 07/16/2017 2232   CO2 26 07/16/2017 2232   GLUCOSE 193 (H) 07/16/2017 2232   HGBA1C 7.9 (A) 03/11/2018 1419   HGBA1C 7.3 (H) 04/12/2017 1212   BUN 14 07/16/2017 2232   BUN 15 08/06/2016 1546   CREATININE 0.91 07/16/2017 2232   CREATININE 0.65 03/14/2015 1149   CALCIUM 9.6 07/16/2017 2232   GFRNONAA >60 07/16/2017 2232   GFRNONAA >89 03/14/2015 1149   GFRAA >60 07/16/2017 2232   GFRAA >89 03/14/2015 1149   WBC 8.8 07/16/2017 2232   HGB 13.1 07/16/2017 2232   HGB 11.8 07/27/2016 1108   HCT 39.5 07/16/2017 2232   HCT 36.2 07/27/2016 1108   PLT 325 07/16/2017 2232   PLT 309 07/27/2016 1108   TSH 2.220 01/09/2016 1505   Ht Readings from Last 2 Encounters:  02/26/18 _0  (1.702 m)  01/07/18 _1  (1.702 m)   Wt Readings from Last 2 Encounters:  02/26/18 184 lb 8 oz (83.7 kg)  01/07/18 184 lb 6.4 oz (83.6 kg)   There is no height or weight on file to calculate BMI. BP Readings from Last 3 Encounters:  03/11/18 (!) 157/79  02/26/18 (!) 174/74  01/07/18 (!) 156/84    A/P: Medications were reviewed  with patient and her medication list was updated. Patient's main concern today is polypharmacy. She also states the exenatide injection is painful and inquired about a once-weekly GLP1-RA. We provided a sample of semaglutide to switch from  twice-daily exenatide. Semaglutide may also provide further efficacy for A1C reduction. [facility] pharmacy confirmed semaglutide coverage for $3 copay. History of sulfonylurea use for > 10 years, so can likely d/c the glimepiride---will follow up with PCP.  BP elevated again today. Current regimen includes amlodipine 5 mg and HCTZ 25 mg daily. Patient reports stopping lisinopril in the past due to hair loss and was switched to olmesartan in a combo pill. Insurance no longer covers the combo pill with olmesartan, but will discuss with PCP re-starting another ARB combination covered by Medicaid (e.g. Exforge HCT, Hyzaar, Diovan HCT, Lotrel).  Patient is interested in smoking cessation. She reports readiness to quit as soon as possible and has tried NRT patch monotherapy in the past and Nicotrol inhaler monotherapy. Patient reports interest in avoiding bupropion and varenicline. She reports smoking 1 pack per day. Provided samples of 21 mg nicotine patch and 2 mg nicotine gum today. Patient was educated about NRT, a referral was placed to [facility], and patient was advised to schedule [facility] follow up appointment.  Patient was offered PPSV23 vaccine but she refused at this time.  An after visit summary was provided and patient advised to follow up in 2 weeks or sooner if any changes in condition or questions regarding medications arise.   The patient verbalized understanding of information provided by repeating back concepts discussed.

## 2018-03-12 NOTE — Telephone Encounter (Signed)
[facility] calls to clarify directions for ozempic directions on script sent states once daily, notes state once weekly, VO for once weekly, do you agree? Please change on medlist

## 2018-03-12 NOTE — Telephone Encounter (Signed)
It is once weekly dosing I agree

## 2018-03-12 NOTE — Telephone Encounter (Signed)
Please change medlist to reflect, thanks

## 2018-03-15 ENCOUNTER — Other Ambulatory Visit: Payer: Self-pay | Admitting: Internal Medicine

## 2018-03-15 DIAGNOSIS — E1142 Type 2 diabetes mellitus with diabetic polyneuropathy: Secondary | ICD-10-CM

## 2018-03-15 MED ORDER — SEMAGLUTIDE(0.25 OR 0.5MG/DOS) 2 MG/1.5ML ~~LOC~~ SOPN: 0.5000 mg | PEN_INJECTOR | SUBCUTANEOUS | 3 refills | Status: DC

## 2018-03-15 NOTE — Progress Notes (Signed)
Was ordered as once daily changed to once weekly

## 2018-03-15 NOTE — Telephone Encounter (Signed)
Med list changed.  Just ccing you Dr. Maudie Mercury so you're aware, this medication may have defaulted to the wrong dosing in epic when ordered.

## 2018-03-17 ENCOUNTER — Telehealth: Payer: Self-pay | Admitting: Pharmacist

## 2018-03-17 DIAGNOSIS — E1142 Type 2 diabetes mellitus with diabetic polyneuropathy: Secondary | ICD-10-CM

## 2018-03-17 DIAGNOSIS — I10 Essential (primary) hypertension: Secondary | ICD-10-CM

## 2018-03-17 MED ORDER — AMLODIPINE BESYLATE 10 MG PO TABS: 10.0000 mg | ORAL_TABLET | Freq: Every evening | ORAL | 11 refills | Status: DC

## 2018-03-17 MED ORDER — SEMAGLUTIDE(0.25 OR 0.5MG/DOS) 2 MG/1.5ML ~~LOC~~ SOPN: 0.5000 mg | PEN_INJECTOR | SUBCUTANEOUS | 3 refills | Status: DC

## 2018-03-17 MED ORDER — NICOTINE 14 MG/24HR TD PT24: MEDICATED_PATCH | TRANSDERMAL | 0 refills | Status: DC

## 2018-03-17 MED ORDER — HYDROCHLOROTHIAZIDE 25 MG PO TABS: 25.0000 mg | ORAL_TABLET | Freq: Every day | ORAL | 3 refills | Status: DC

## 2018-03-17 MED ORDER — NICOTINE POLACRILEX 2 MG MT GUM: CHEWING_GUM | OROMUCOSAL | 0 refills | Status: DC

## 2018-03-17 NOTE — Progress Notes (Signed)
Patient requested refill of HCTZ, sent Rx. Sent corrected Rx for [facility]ide to [facility], and increased amlodipine to 10 mg daily for HTN mgmt per PCP.

## 2018-03-17 NOTE — Addendum Note (Signed)
Addended by: Forde Dandy on: 03/17/2018 09:43 AM   Modules accepted: Orders, SmartSet

## 2018-03-18 LAB — HM DIABETES EYE EXAM

## 2018-03-19 ENCOUNTER — Other Ambulatory Visit: Payer: Self-pay | Admitting: Internal Medicine

## 2018-03-19 ENCOUNTER — Encounter: Payer: Self-pay | Admitting: *Deleted

## 2018-03-19 DIAGNOSIS — E118 Type 2 diabetes mellitus with unspecified complications: Secondary | ICD-10-CM

## 2018-03-19 NOTE — Telephone Encounter (Signed)
refilled 

## 2018-03-26 ENCOUNTER — Encounter: Payer: Self-pay | Admitting: *Deleted

## 2018-04-04 ENCOUNTER — Ambulatory Visit: Payer: Medicaid Other | Admitting: Podiatry

## 2018-04-07 ENCOUNTER — Ambulatory Visit: Payer: Medicaid Other | Admitting: Podiatry

## 2018-04-14 ENCOUNTER — Encounter: Payer: Medicaid Other | Admitting: Internal Medicine

## 2018-04-24 ENCOUNTER — Ambulatory Visit: Payer: Medicaid Other | Admitting: Podiatry

## 2018-05-01 ENCOUNTER — Telehealth: Payer: Self-pay | Admitting: Internal Medicine

## 2018-05-01 NOTE — Telephone Encounter (Signed)
Phone call may have been automated reminder for appt 05/05/2018 with PCP at 3:15. Patient confirmed appt date and time. Hubbard Hartshorn, RN, BSN

## 2018-05-01 NOTE — Telephone Encounter (Signed)
Pt is returning a call, unsure who called. Pt cotnact# [phone]

## 2018-05-05 ENCOUNTER — Encounter: Payer: Medicaid Other | Admitting: Internal Medicine

## 2018-05-13 ENCOUNTER — Ambulatory Visit: Payer: Medicaid Other

## 2018-05-14 ENCOUNTER — Encounter: Payer: Self-pay | Admitting: Internal Medicine

## 2018-05-14 ENCOUNTER — Other Ambulatory Visit (HOSPITAL_COMMUNITY)
Admission: RE | Admit: 2018-05-14 | Discharge: 2018-05-14 | Disposition: A | Discharge status: Home / Self Care | Payer: Medicaid Other | Source: Ambulatory Visit | Attending: Internal Medicine | Admitting: Internal Medicine

## 2018-05-14 ENCOUNTER — Ambulatory Visit (INDEPENDENT_AMBULATORY_CARE_PROVIDER_SITE_OTHER): Payer: Medicaid Other | Admitting: Internal Medicine

## 2018-05-14 ENCOUNTER — Other Ambulatory Visit: Payer: Self-pay

## 2018-05-14 VITALS — BP 165/85 | HR 75 | Temp 98.0°F | Ht 67.0 in | Wt 175.6 lb

## 2018-05-14 DIAGNOSIS — R200 Anesthesia of skin: Secondary | ICD-10-CM | POA: Insufficient documentation

## 2018-05-14 DIAGNOSIS — N949 Unspecified condition associated with female genital organs and menstrual cycle: Secondary | ICD-10-CM | POA: Diagnosis not present

## 2018-05-14 DIAGNOSIS — R208 Other disturbances of skin sensation: Secondary | ICD-10-CM

## 2018-05-14 DIAGNOSIS — N9489 Other specified conditions associated with female genital organs and menstrual cycle: Secondary | ICD-10-CM | POA: Insufficient documentation

## 2018-05-14 DIAGNOSIS — E1140 Type 2 diabetes mellitus with diabetic neuropathy, unspecified: Secondary | ICD-10-CM | POA: Diagnosis not present

## 2018-05-14 MED ORDER — FLUCONAZOLE 100 MG PO TABS: 150.0000 mg | ORAL_TABLET | Freq: Every day | ORAL | 0 refills | Status: AC

## 2018-05-14 NOTE — Patient Instructions (Signed)
Bridget Owens,  It was a pleasure taking care of you here at the clinic today.  Hears we discussed today  1.  For the numbness in your left thumb I recommend that you purchase a wrist splint and wear this at night before you go to bed.  This should help alleviate some of the symptoms 2.  For the vaginal burning, I think you might have a yeast infection and I am prescribing you a medication called Diflucan which she will take only 1 pill.  Please pick this up at the pharmacy  Dr. Eileen Stanford

## 2018-05-14 NOTE — Assessment & Plan Note (Signed)
Vaginal burning: Long-standing history of uncontrolled diabetes now with a 55-month history of vaginal burning.  Denies vaginal discharge, dysuria, itching, urinary frequency.  Has been sexually active for 20 years.  No new recent changes in medication.  GU exams with speculum revealed scant white ectocervical discharge. -Most likely vaginal candidiasis -Wet mount pending -Empirically treat with Diflucan 150 mg x 1 dose

## 2018-05-14 NOTE — Assessment & Plan Note (Signed)
Median nerve radiculopathy: 70-month history of left thumb numbness with intermittent radiation to the second and third digit.  Denies weakness or difficulty with grip.  Physical exam was negative for Phalen test and Tinel sign.  Given her long-standing history of diabetes she might have peripheral nerve paresthesia that could be contributing to the numbness or carpal tunnel syndrome given her increase housework.  -Advised to try nighttime wrist splint

## 2018-05-14 NOTE — Progress Notes (Signed)
CC: Left thumb numbness and vaginal burning  HPI:  [name] is a 62 y.o. with uncontrolled type 2 diabetes and lower extremity neuropathy presented with numbness of the left thumb.  She reports that since April she has been feeling numbness and tingling of her left thumb and sometimes intermittently travels to the second and third digit of the left hand.  She does do a lot of housework but denies prolonged use of her wrist.  She denies any weakness or difficulty with grip or dropping items.  She also reports over 63-month history of vaginal burning but denies discharge, dysuria, itching, urinary frequency.  She does report increased urinary urgency.  Patient is not sexually active and has not had any new changes in her medication.  Median nerve radiculopathy: 15-month history of left thumb numbness with intermittent radiation to the second and third digit.  Denies weakness or difficulty with grip.  Physical exam was negative for Phalen test and Tinel sign.  Given her long-standing history of diabetes she might have peripheral nerve paresthesia that could be contributing to the numbness or carpal tunnel syndrome given her increase housework. -Advised to try nighttime wrist splint  Vaginal burning: Long-standing history of uncontrolled diabetes now with a 60-month history of vaginal burning.  Denies vaginal discharge, dysuria, itching, urinary frequency.  Has been sexually active for 20 years.  No new recent changes in medication.  GU exams with speculum revealed scant white ectocervical discharge. -Most likely vaginal candidiasis -Wet mount pending -Empirically treat with Diflucan 150 mg x 1 dose  Past Medical History:  Diagnosis Date  . Adenomatous colon polyp 6/09   Resected on colonoscopy, no high grade dysplasia.   . Allergic rhinitis   . Bell's palsy 01/2009   Left sided  . Chest pain    Exercise stress test negative 6/07  . Colon polyps    03/22/2008: adenomatous polyps x3 w no  dysplasia. Needs repeat colonoscopy in 3 years  . Diabetic peripheral neuropathy ([place])   . External hemorrhoid   . GERD (gastroesophageal reflux disease)   . Hyperlipidemia   . Hypertension   . Hypertensive retinopathy    Followed by Dr. Herbert Deaner.  . Insomnia   . Intolerance, drug    Leg cramps on Lipitor  . Lipoma 12/11   Posterior neck.   . Osteoarthritis    Carpometacarpal joint of right thumb  . Postmenopausal bleeding 9/05   Endometrial biopsy showed  FOCAL TUBAL METAPLASIA   . Postmenopausal symptoms    Hot flashes, vaginal dryness, iritability, difficulty sleeping. as of 2005.  Marland Kitchen Sebaceous cyst of breast    Has been refered to derm.  . Trigger finger    Right thumb  . Type II diabetes mellitus ([place])   . Uterine fibroid    Review of Systems:  As per HPI  Physical Exam:  Vitals:   05/14/18 1103  BP: (!) 165/85  Pulse: 75  Temp: 98 F (36.7 C)  TempSrc: Oral  SpO2: 100%  Weight: 175 lb 9.6 oz (79.7 kg)  Height: 5\' 7"  (1.702 m)   Physical Exam  Constitutional: She is well-developed, well-nourished, and in no distress.  Cardiovascular: Normal rate, regular rhythm and normal heart sounds. Exam reveals no gallop and no friction rub.  No murmur heard. Pulmonary/Chest: Breath sounds normal.  Genitourinary: Cervix exhibits no motion tenderness. Vagina exhibits no exudate. Thin  odorless  white and vaginal discharge found.    Assessment & Plan:   See Encounters  Tab for problem based charting.  Patient discussed with Dr. Dareen Piano

## 2018-05-15 ENCOUNTER — Ambulatory Visit: Payer: Medicaid Other | Admitting: Podiatry

## 2018-05-15 ENCOUNTER — Encounter: Payer: Self-pay | Admitting: Internal Medicine

## 2018-05-15 DIAGNOSIS — M79675 Pain in left toe(s): Secondary | ICD-10-CM | POA: Diagnosis not present

## 2018-05-15 DIAGNOSIS — Q828 Other specified congenital malformations of skin: Secondary | ICD-10-CM | POA: Diagnosis not present

## 2018-05-15 DIAGNOSIS — E1142 Type 2 diabetes mellitus with diabetic polyneuropathy: Secondary | ICD-10-CM | POA: Diagnosis not present

## 2018-05-15 DIAGNOSIS — B351 Tinea unguium: Secondary | ICD-10-CM | POA: Diagnosis not present

## 2018-05-15 DIAGNOSIS — M79674 Pain in right toe(s): Secondary | ICD-10-CM | POA: Diagnosis not present

## 2018-05-15 LAB — CERVICOVAGINAL ANCILLARY ONLY
Bacterial vaginitis: NEGATIVE
Candida vaginitis: POSITIVE — AB
Trichomonas: NEGATIVE

## 2018-05-15 NOTE — Progress Notes (Signed)
I have sent a letter to Bridget Owens with lab results and also assured her that I empirically treated her with diflucan 150mg  x1

## 2018-05-15 NOTE — Progress Notes (Signed)
Internal Medicine Clinic Attending  I saw and evaluated the patient.  I personally confirmed the key portions of the history and exam documented by Dr. Agyei and I reviewed pertinent patient test results.  The assessment, diagnosis, and plan were formulated together and I agree with the documentation in the resident's note.  

## 2018-05-19 ENCOUNTER — Telehealth: Payer: Self-pay | Admitting: Internal Medicine

## 2018-05-19 ENCOUNTER — Other Ambulatory Visit: Payer: Self-pay | Admitting: *Deleted

## 2018-05-19 DIAGNOSIS — N949 Unspecified condition associated with female genital organs and menstrual cycle: Secondary | ICD-10-CM

## 2018-05-19 MED ORDER — FLUCONAZOLE 100 MG PO TABS: 150.0000 mg | ORAL_TABLET | Freq: Every day | ORAL | 0 refills | Status: DC

## 2018-05-19 NOTE — Assessment & Plan Note (Signed)
I received message from Bonnita Nasuti that patient had called reporting continue vaginal burning and itching.  She was found to have vaginal candidiasis on wet mount prep and received Diflucan 150 mg x 1 dose.  I am prescribing her an additional dose of Diflucan 150 mg to complete a total of 2 doses.

## 2018-05-19 NOTE — Telephone Encounter (Signed)
Hi Bridget Owens,  I have given patient a prescription of Diflucan 150 mg x 1 dose she was found to have vaginal candidiasis.  She can pick this up at the pharmacy tomorrow.  This should help with her yeast infection.  If symptoms gets worse she can follow-up with the clinic.  Thank you

## 2018-05-19 NOTE — Telephone Encounter (Signed)
Pt  Finished her medication for her Vaginal Burning and irritation and is still burning and would like to know what to do. Please call patient back.

## 2018-05-19 NOTE — Telephone Encounter (Signed)
Dr Eileen Stanford, do you want her to come back in for additional eval?

## 2018-05-20 ENCOUNTER — Other Ambulatory Visit: Payer: Self-pay | Admitting: Internal Medicine

## 2018-05-20 DIAGNOSIS — N949 Unspecified condition associated with female genital organs and menstrual cycle: Secondary | ICD-10-CM

## 2018-05-20 MED ORDER — FLUCONAZOLE 100 MG PO TABS: 150.0000 mg | ORAL_TABLET | Freq: Every day | ORAL | 0 refills | Status: AC

## 2018-06-02 ENCOUNTER — Other Ambulatory Visit: Payer: Self-pay

## 2018-06-02 ENCOUNTER — Ambulatory Visit (INDEPENDENT_AMBULATORY_CARE_PROVIDER_SITE_OTHER): Payer: Medicaid Other | Admitting: Internal Medicine

## 2018-06-02 ENCOUNTER — Encounter: Payer: Self-pay | Admitting: Internal Medicine

## 2018-06-02 ENCOUNTER — Encounter: Payer: Medicaid Other | Admitting: Internal Medicine

## 2018-06-02 VITALS — BP 135/63 | HR 90 | Temp 97.9°F | Wt 177.8 lb

## 2018-06-02 DIAGNOSIS — Z79899 Other long term (current) drug therapy: Secondary | ICD-10-CM | POA: Diagnosis not present

## 2018-06-02 DIAGNOSIS — I10 Essential (primary) hypertension: Secondary | ICD-10-CM | POA: Diagnosis not present

## 2018-06-02 DIAGNOSIS — R252 Cramp and spasm: Secondary | ICD-10-CM

## 2018-06-02 DIAGNOSIS — Z8742 Personal history of other diseases of the female genital tract: Secondary | ICD-10-CM

## 2018-06-02 DIAGNOSIS — Z7984 Long term (current) use of oral hypoglycemic drugs: Secondary | ICD-10-CM | POA: Diagnosis not present

## 2018-06-02 DIAGNOSIS — Z1239 Encounter for other screening for malignant neoplasm of breast: Secondary | ICD-10-CM

## 2018-06-02 DIAGNOSIS — F17210 Nicotine dependence, cigarettes, uncomplicated: Secondary | ICD-10-CM

## 2018-06-02 DIAGNOSIS — E1142 Type 2 diabetes mellitus with diabetic polyneuropathy: Secondary | ICD-10-CM

## 2018-06-02 DIAGNOSIS — Z8639 Personal history of other endocrine, nutritional and metabolic disease: Secondary | ICD-10-CM

## 2018-06-02 DIAGNOSIS — Z833 Family history of diabetes mellitus: Secondary | ICD-10-CM

## 2018-06-02 DIAGNOSIS — Z9111 Patient's noncompliance with dietary regimen: Secondary | ICD-10-CM | POA: Diagnosis not present

## 2018-06-02 LAB — GLUCOSE, CAPILLARY: Glucose-Capillary: 329 mg/dL — ABNORMAL HIGH (ref 70–99)

## 2018-06-02 NOTE — Progress Notes (Signed)
CC: follow up of T2DM, HTN, hand cramping  HPI:  [name] is a 62 y.o. female with PMH below.  She is here to follow up on her T2DM, HTN, hand cramping.  Please see A&P for status of the patient's chronic medical conditions  Past Medical History:  Diagnosis Date  . Adenomatous colon polyp 6/09   Resected on colonoscopy, no high grade dysplasia.   . Allergic rhinitis   . Bell's palsy 01/2009   Left sided  . Chest pain    Exercise stress test negative 6/07  . Colon polyps    03/22/2008: adenomatous polyps x3 w no dysplasia. Needs repeat colonoscopy in 3 years  . Diabetic peripheral neuropathy ([place])   . External hemorrhoid   . GERD (gastroesophageal reflux disease)   . Hyperlipidemia   . Hypertension   . Hypertensive retinopathy    Followed by Dr. Herbert Deaner.  . Insomnia   . Intolerance, drug    Leg cramps on Lipitor  . Lipoma 12/11   Posterior neck.   . Osteoarthritis    Carpometacarpal joint of right thumb  . Postmenopausal bleeding 9/05   Endometrial biopsy showed  FOCAL TUBAL METAPLASIA   . Postmenopausal symptoms    Hot flashes, vaginal dryness, iritability, difficulty sleeping. as of 2005.  Marland Kitchen Sebaceous cyst of breast    Has been refered to derm.  . Trigger finger    Right thumb  . Type II diabetes mellitus ([place])   . Uterine fibroid    Review of Systems:  ROS: Pulmonary: pt denies increased work of breathing, shortness of breath,  Cardiac: pt denies palpitations, chest pain,  Abdominal: pt denies abdominal pain, nausea, vomiting, or diarrhea  Physical Exam:  Vitals:   06/02/18 1611  BP: 135/63  Pulse: 90  Temp: 97.9 F (36.6 C)  TempSrc: Oral  SpO2: 99%  Weight: 177 lb 12.8 oz (80.6 kg)   Cardiac: normal rate and rhythm, clear s1 and s2 Pulmonary: CTAB, not in distress Abdominal: non distended abdomen, soft and nontender Extremities: no LE edema, upper extremities with normal strength and sensation bilaterally, normal hand strength and  sensation, no atrophy of the muscles of the hand Mental status: Alert, conversant, in good spirits  Social History   Socioeconomic History  . Marital status: Single    Spouse name: Not on file  . Number of children: Not on file  . Years of education: Not on file  . Highest education level: Not on file  Occupational History  . Not on file  Social Needs  . Financial resource strain: Not on file  . Food insecurity:    Worry: Not on file    Inability: Not on file  . Transportation needs:    Medical: Not on file    Non-medical: Not on file  Tobacco Use  . Smoking status: Current Every Day Smoker    Packs/day: 0.50    Years: 30.00    Pack years: 15.00    Types: Cigarettes  . Smokeless tobacco: Former Systems developer    Quit date: 11/28/2010  Substance and Sexual Activity  . Alcohol use: No    Alcohol/week: 0.0 standard drinks  . Drug use: No  . Sexual activity: Not on file  Lifestyle  . Physical activity:    Days per week: Not on file    Minutes per session: Not on file  . Stress: Not on file  Relationships  . Social connections:    Talks on phone: Not on file  Gets together: Not on file    Attends religious service: Not on file    Active member of club or organization: Not on file    Attends meetings of clubs or organizations: Not on file    Relationship status: Not on file  . Intimate partner violence:    Fear of current or ex partner: Not on file    Emotionally abused: Not on file    Physically abused: Not on file    Forced sexual activity: Not on file  Other Topics Concern  . Not on file  Social History Narrative  . Not on file    Family History  Problem Relation Age of Onset  . Pneumonia Mother   . Diabetes Mother   . Early death Father   . Diabetes Sister   . Diabetes Brother   . Diabetes Maternal Grandmother     Assessment & Plan:   See Encounters Tab for problem based charting.  Patient discussed with Dr. Lynnae January

## 2018-06-02 NOTE — Patient Instructions (Signed)
Bridget Owens, we will get some labs to assess why your hands may be cramping.  If it is a result of your blood pressure we will need to change your blood pressure medications around.  Please change your diet as we discussed to help control your blood sugars.  I will have you follow up in about 3 months.

## 2018-06-03 ENCOUNTER — Encounter: Payer: Self-pay | Admitting: Internal Medicine

## 2018-06-03 ENCOUNTER — Telehealth: Payer: Self-pay | Admitting: *Deleted

## 2018-06-03 LAB — CBC WITH DIFFERENTIAL/PLATELET
Basophils Absolute: 0 10*3/uL (ref 0.0–0.2)
Basos: 0 %
EOS (ABSOLUTE): 0.1 10*3/uL (ref 0.0–0.4)
Eos: 2 %
Hematocrit: 40.6 % (ref 34.0–46.6)
Hemoglobin: 13.7 g/dL (ref 11.1–15.9)
Immature Grans (Abs): 0 10*3/uL (ref 0.0–0.1)
Immature Granulocytes: 0 %
Lymphocytes Absolute: 2.4 10*3/uL (ref 0.7–3.1)
Lymphs: 33 %
MCH: 28.7 pg (ref 26.6–33.0)
MCHC: 33.7 g/dL (ref 31.5–35.7)
MCV: 85 fL (ref 79–97)
Monocytes Absolute: 0.4 10*3/uL (ref 0.1–0.9)
Monocytes: 5 %
Neutrophils Absolute: 4.5 10*3/uL (ref 1.4–7.0)
Neutrophils: 60 %
Platelets: 326 10*3/uL (ref 150–450)
RBC: 4.77 x10E6/uL (ref 3.77–5.28)
RDW: 13.4 % (ref 12.3–15.4)
WBC: 7.4 10*3/uL (ref 3.4–10.8)

## 2018-06-03 LAB — CMP14 + ANION GAP
ALT: 15 IU/L (ref 0–32)
AST: 13 IU/L (ref 0–40)
Albumin/Globulin Ratio: 1.6 (ref 1.2–2.2)
Albumin: 4.4 g/dL (ref 3.6–4.8)
Alkaline Phosphatase: 74 IU/L (ref 39–117)
Anion Gap: 14 mmol/L (ref 10.0–18.0)
BUN/Creatinine Ratio: 17 (ref 12–28)
BUN: 12 mg/dL (ref 8–27)
Bilirubin Total: <0.2 mg/dL (ref 0.0–1.2)
CO2: 27 mmol/L (ref 20–29)
Calcium: 10 mg/dL (ref 8.7–10.3)
Chloride: 98 mmol/L (ref 96–106)
Creatinine, Ser: 0.71 mg/dL (ref 0.57–1.00)
GFR calc Af Amer: 104 mL/min/{1.73_m2} (ref >59)
GFR calc non Af Amer: 90 mL/min/{1.73_m2} (ref >59)
Globulin, Total: 2.8 g/dL (ref 1.5–4.5)
Glucose: 230 mg/dL — ABNORMAL HIGH (ref 65–99)
Potassium: 3.6 mmol/L (ref 3.5–5.2)
Sodium: 139 mmol/L (ref 134–144)
Total Protein: 7.2 g/dL (ref 6.0–8.5)

## 2018-06-03 LAB — LIPID PANEL
Chol/HDL Ratio: 3.6 ratio (ref 0.0–4.4)
Cholesterol, Total: 148 mg/dL (ref 100–199)
HDL: 41 mg/dL (ref >39)
LDL Calculated: 92 mg/dL (ref 0–99)
Triglycerides: 77 mg/dL (ref 0–149)
VLDL Cholesterol Cal: 15 mg/dL (ref 5–40)

## 2018-06-03 NOTE — Assessment & Plan Note (Signed)
Lab Results  Component Value Date   HGBA1C 7.9 (A) 03/11/2018   Patient did not bring her cbgs today, we took a cbg here today and it was extremely elevated at 329.  She admits to drinking her grandsons soda right before coming in.  She continues to be uninterested on any kind of insulin even a once a day long-acting.  At this point she is barely tolerating the once weekly Ozempic.  But she agrees she will continue to take this medicine.  She is currently on glimepiride 5, Ozempic 0.5 mg, metformin 1000 mg twice daily.  Her A1c is not where I would like it but it is reasonably controlled at 7.9.  In talking with her about her diabetes it seems her main culprit is her diet.  She continues to eat a lot of fried foods and potatoes.  She says she fries most of her foods because it is been very hot and her stove creates a lot of heat.  She also occasionally does drink sodas like the one that she did before coming in.    -Continue to encourage patient to work on her diet -We are more or less maxed out on therapy that does not contain insulin, patient is not interested in insulin and would likely be a poor candidate -We will follow-up for repeat A1c in about 3 months

## 2018-06-03 NOTE — Assessment & Plan Note (Signed)
BP Readings from Last 3 Encounters:  06/02/18 135/63  05/14/18 (!) 165/85  03/11/18 (!) 157/79  BP much improved today, she is still on amlodipine 10mg , HCTZ 25mg  daily.  It seems that her bp seems to be better when she is compliant with her medication.  She was able to pick up her amlodipine after her last visit for vaginal candidiasis.    -continue regimen above

## 2018-06-03 NOTE — Telephone Encounter (Signed)
Patient called in requesting a letter be written to the [facility] stating that patient cannot stay alone 2/2 chronic disease needs/afety issues. This will allow patient to have a bigger apt to accommodate her daughter and 3 grandchildren. Currently lives in one bedroom apt. Patient has received letter from eye doctor, however, [employer] requiring second letter. Hubbard Hartshorn, RN, BSN

## 2018-06-03 NOTE — Assessment & Plan Note (Addendum)
Patient reports cramping in her hands that can be rather severe at times.  She said it can also be painful, is not a weakness of the hands but cramping that is intermittent.  Chart review shows she has had some hypokalemia in the past.  Her physical exam today was very reassuring she has very strong arms and hands and good preserved sensation there is no atrophy of the muscles of the hand.    -Evaluated metabolic panel and cbc that returned grossly unremarkable -With it being a little harder outside in her being on a diuretic it is possible that it could be a low volume situation however her blood pressure does not reflect this today, I will try a trial of the lower dose of diuretic if this continues to be an issue for the patient -We will also check a magnesium at next visit although I would expect it to be normal given her normal potassium -continue to monitor for resolution

## 2018-06-03 NOTE — Telephone Encounter (Signed)
Patient was asked to bring back a form for this from the [employer].  There is apparently a specific form to be filled out.

## 2018-06-04 NOTE — Telephone Encounter (Signed)
Attempted to reach patient to discuss form. Line went directly to recording, "Call cannot be completed at this time." Unable to leave VM. Hubbard Hartshorn, RN, BSN

## 2018-06-04 NOTE — Progress Notes (Signed)
Internal Medicine Clinic Attending  Case discussed with Dr. Winfrey  at the time of the visit.  We reviewed the resident's history and exam and pertinent patient test results.  I agree with the assessment, diagnosis, and plan of care documented in the resident's note.  

## 2018-06-06 ENCOUNTER — Ambulatory Visit: Payer: Medicaid Other

## 2018-06-06 NOTE — Telephone Encounter (Signed)
Called pt - no answer; unable to leave message "VM has not been set up yet".

## 2018-06-07 NOTE — Progress Notes (Signed)
Subjective:  Patient ID: Bridget Owens, female    DOB: [birth year],  MRN: [id number]  Chief Complaint  Patient presents with  . debride    B/L nail trimming and callus trimming -FBS: 130 x 1 day     62 y.o. female presents  for diabetic foot care. Last AMBS was 131-day ago.  Does not know her primary care provider.  Last seen 3 months ago.. Denies numbness and tingling in their feet. Denies cramping in legs and thighs.  Review of Systems: Negative except as noted in the HPI. Denies N/V/F/Ch.  Past Medical History:  Diagnosis Date  . Adenomatous colon polyp 6/09   Resected on colonoscopy, no high grade dysplasia.   . Allergic rhinitis   . Bell's palsy 01/2009   Left sided  . Chest pain    Exercise stress test negative 6/07  . Colon polyps    03/22/2008: adenomatous polyps x3 w no dysplasia. Needs repeat colonoscopy in 3 years  . Diabetic peripheral neuropathy ([place])   . External hemorrhoid   . GERD (gastroesophageal reflux disease)   . Hyperlipidemia   . Hypertension   . Hypertensive retinopathy    Followed by Dr. Herbert Deaner.  . Insomnia   . Intolerance, drug    Leg cramps on Lipitor  . Lipoma 12/11   Posterior neck.   . Osteoarthritis    Carpometacarpal joint of right thumb  . Postmenopausal bleeding 9/05   Endometrial biopsy showed  FOCAL TUBAL METAPLASIA   . Postmenopausal symptoms    Hot flashes, vaginal dryness, iritability, difficulty sleeping. as of 2005.  Marland Kitchen Sebaceous cyst of breast    Has been refered to derm.  . Trigger finger    Right thumb  . Type II diabetes mellitus ([place])   . Uterine fibroid     Current Outpatient Medications:  .  ACCU-CHEK FASTCLIX LANCETS MISC, Use to check sugar  daily as directed, Disp: 102 each, Rfl: 3 .  ACCU-CHEK SOFTCLIX LANCETS lancets, USE TO check blood sugar 4 times daily. diag code E11.42. Insulin dependent, Disp: 400 each, Rfl: 1 .  albuterol (PROVENTIL HFA) 108 (90 Base) MCG/ACT inhaler, Inhale 1-2 puffs into the lungs every  6 (six) hours as needed for wheezing or shortness of breath., Disp: 1 Inhaler, Rfl: 0 .  amLODipine (NORVASC) 10 MG tablet, Take 1 tablet (10 mg total) by mouth every evening., Disp: 30 tablet, Rfl: 11 .  aspirin 81 MG EC tablet, Take 81 mg by mouth daily.  , Disp: , Rfl:  .  atorvastatin (LIPITOR) 40 MG tablet, TAKE 1 TABLET(40 MG) BY MOUTH DAILY, Disp: 90 tablet, Rfl: 3 .  Blood Glucose Monitoring Suppl (ACCU-CHEK GUIDE) w/Device KIT, 1 each by Does not apply route daily., Disp: 1 kit, Rfl: 0 .  ciclopirox (PENLAC) 8 % solution, Apply topically at bedtime. Apply over nail and surrounding skin. Apply daily over previous coat. After seven (7) days, may remove with alcohol and continue cycle., Disp: 6.6 mL, Rfl: 0 .  fluticasone (FLONASE) 50 MCG/ACT nasal spray, Place 1 spray into both nostrils daily., Disp: 16 g, Rfl: 0 .  gabapentin (NEURONTIN) 300 MG capsule, Take 1 capsule (300 mg total) by mouth at bedtime., Disp: 60 capsule, Rfl: 0 .  glimepiride (AMARYL) 4 MG tablet, TAKE 1 TABLET BY MOUTH ONCE DAILY WITH BREAKFAST OR BEFORE LARGEST MEAL OF THE DAY, Disp: 90 tablet, Rfl: 1 .  glucose blood (ACCU-CHEK GUIDE) test strip, Check blood sugar one time a day, Disp: 50  each, Rfl: 5 .  glucose blood (ACCU-CHEK SMARTVIEW) test strip, Use to check blood sugar 4 times daily. diag code E11.42. Insulin dependent, Disp: 375 each, Rfl: 11 .  guaiFENesin (MUCINEX) 600 MG 12 hr tablet, Take 600 mg by mouth 2 (two) times daily as needed for cough., Disp: , Rfl:  .  hydrochlorothiazide (HYDRODIURIL) 25 MG tablet, Take 1 tablet (25 mg total) by mouth daily., Disp: 60 tablet, Rfl: 3 .  ibuprofen (ADVIL,MOTRIN) 600 MG tablet, Can use one tablet 2-3 times per week with food for hip pain., Disp: 30 tablet, Rfl: 2 .  metFORMIN (GLUCOPHAGE) 1000 MG tablet, TAKE 1 TABLET(1000 MG) BY MOUTH TWICE DAILY WITH A MEAL, Disp: 180 tablet, Rfl: 1 .  nicotine (NICODERM CQ - DOSED IN MG/24 HOURS) 14 mg/24hr patch, Apply 1 patch  daily. Wear for 24 hours, then remove and replace with new patch. If you have sleep disturbances, remove at bedtime., Disp: 42 patch, Rfl: 0 .  nicotine polacrilex (NICORETTE) 2 MG gum, RX #2 Weeks 7-9: 1 piece every 2-4 hours.Max 24 pieces per day., Disp: 252 each, Rfl: 0 .  pantoprazole (PROTONIX) 40 MG tablet, TAKE ONE TABLET BY MOUTH EVERY DAY, Disp: 90 tablet, Rfl: 1 .  Semaglutide (OZEMPIC) 0.25 or 0.5 MG/DOSE SOPN, Inject 0.5 mg into the skin once a week., Disp: 1 pen, Rfl: 3  Social History   Tobacco Use  Smoking Status Current Every Day Smoker  . Packs/day: 0.50  . Years: 30.00  . Pack years: 15.00  . Types: Cigarettes  Smokeless Tobacco Former Systems developer  . Quit date: 11/28/2010    Allergies  Allergen Reactions  . Codeine Other (See Comments)    "felt funny"   Objective:  There were no vitals filed for this visit. There is no height or weight on file to calculate BMI. Constitutional Well developed. Well nourished.  Vascular Dorsalis pedis pulses present 1+ bilaterally  Posterior tibial pulses present 1+ bilaterally  Pedal hair growth diminished. Capillary refill normal to all digits.  No cyanosis or clubbing noted.  Neurologic Normal speech. Oriented to person, place, and time. Epicritic sensation to light touch grossly present bilaterally. Protective sensation with 5.07 monofilament  absent bilaterally.  Dermatologic Nails elongated, thickened, dystrophic. No open wounds. Hyperkeratotic lesion sub-met 5 left  Orthopedic: Normal joint ROM without pain or crepitus bilaterally. No visible deformities. No bony tenderness.   Assessment:   1. Diabetic polyneuropathy associated with type 2 diabetes mellitus (HCC)   2. Pain due to onychomycosis of toenails of both feet   3. Porokeratosis    Plan:  Patient was evaluated and treated and all questions answered.  Diabetes with DPN, Onychomycosis -Educated on diabetic footcare. Diabetic risk level 1 -Nails x10 debrided  sharply and manually with large nail nipper and rotary burr.   Procedure: Nail Debridement Rationale: Patient meets criteria for routine foot care due to DPN Type of Debridement: manual, sharp debridement. Instrumentation: Nail nipper, rotary burr. Number of Nails: 10   Procedure: Paring of Lesion Rationale: painful hyperkeratotic lesion Type of Debridement: manual, sharp debridement. Instrumentation: 312 blade Number of Lesions: 1   Return in about 3 months (around 08/15/2018) for Diabetic Foot Care.

## 2018-06-10 NOTE — Telephone Encounter (Signed)
Called Seymour Bars @ [employer] [phone]) to assist the patient with an accomodation request per the patient. Unable to speak with Izora Gala.  Left VM for her to rtn phone call to see if an accomodation Letter is still required as, Normally we can assist the pt with completing requests of this nature but we need the forms.

## 2018-06-11 ENCOUNTER — Encounter: Payer: Self-pay | Admitting: Internal Medicine

## 2018-06-11 ENCOUNTER — Ambulatory Visit: Payer: Medicaid Other

## 2018-06-11 NOTE — Progress Notes (Signed)
Sent pt a letter with results

## 2018-07-14 ENCOUNTER — Ambulatory Visit: Payer: Medicaid Other | Admitting: Internal Medicine

## 2018-07-14 ENCOUNTER — Other Ambulatory Visit: Payer: Self-pay

## 2018-07-14 VITALS — BP 172/84 | HR 75 | Temp 98.8°F | Ht 67.0 in | Wt 178.7 lb

## 2018-07-14 DIAGNOSIS — I10 Essential (primary) hypertension: Secondary | ICD-10-CM | POA: Diagnosis not present

## 2018-07-14 DIAGNOSIS — E119 Type 2 diabetes mellitus without complications: Secondary | ICD-10-CM | POA: Diagnosis not present

## 2018-07-14 DIAGNOSIS — L299 Pruritus, unspecified: Secondary | ICD-10-CM

## 2018-07-14 DIAGNOSIS — R252 Cramp and spasm: Secondary | ICD-10-CM

## 2018-07-14 MED ORDER — HYDROCHLOROTHIAZIDE 25 MG PO TABS: 25.0000 mg | ORAL_TABLET | Freq: Every day | ORAL | 3 refills | Status: DC

## 2018-07-14 MED ORDER — GABAPENTIN 300 MG PO CAPS: 300.0000 mg | ORAL_CAPSULE | Freq: Every day | ORAL | 0 refills | Status: DC

## 2018-07-14 MED ORDER — AMLODIPINE BESYLATE 10 MG PO TABS: 10.0000 mg | ORAL_TABLET | Freq: Every evening | ORAL | 11 refills | Status: DC

## 2018-07-14 NOTE — Telephone Encounter (Signed)
Spoke with the pt to f/u with her Letter.  The patient states everything was put on hold until the new person takes over.  PEr  The patient she will f/u with [place] after speaking with [employer].

## 2018-07-14 NOTE — Patient Instructions (Signed)
Bridget Owens, It was a pleasure meeting you. Please use hydrocortisone cream for the place on your bottom and ankle for itching.  I have sent in your blood pressure medications for refills.  I am checking some lab work to make sure your electrolytes are normal.   Please follow-up with your PCP as scheduled or return to [facility] if needed.

## 2018-07-15 ENCOUNTER — Encounter: Payer: Self-pay | Admitting: Internal Medicine

## 2018-07-15 ENCOUNTER — Other Ambulatory Visit: Payer: Self-pay | Admitting: Internal Medicine

## 2018-07-15 LAB — BMP8+ANION GAP
Anion Gap: 14 mmol/L (ref 10.0–18.0)
BUN/Creatinine Ratio: 15 (ref 12–28)
BUN: 14 mg/dL (ref 8–27)
CO2: 25 mmol/L (ref 20–29)
Calcium: 10.2 mg/dL (ref 8.7–10.3)
Chloride: 102 mmol/L (ref 96–106)
Creatinine, Ser: 0.92 mg/dL (ref 0.57–1.00)
GFR calc Af Amer: 76 mL/min/{1.73_m2} (ref >59)
GFR calc non Af Amer: 66 mL/min/{1.73_m2} (ref >59)
Glucose: 161 mg/dL — ABNORMAL HIGH (ref 65–99)
Potassium: 4.5 mmol/L (ref 3.5–5.2)
Sodium: 141 mmol/L (ref 134–144)

## 2018-07-15 LAB — MAGNESIUM: Magnesium: 2.2 mg/dL (ref 1.6–2.3)

## 2018-07-15 MED ORDER — IBUPROFEN 600 MG PO TABS: ORAL_TABLET | ORAL | 3 refills | Status: DC

## 2018-07-15 NOTE — Assessment & Plan Note (Signed)
Area at lateral malleolus appears to have insect bite with healing scab. No evidence of cellulitis. Does not appear consistent with SK or AK. Encouraged application of hydrocortisone ointment for pruritis.

## 2018-07-15 NOTE — Assessment & Plan Note (Signed)
>>  ASSESSMENT AND PLAN FOR NOTALGIA PARESTHETICA WRITTEN ON 07/15/2018  6:29 AM BY BLOOMFIELD, CARLEY D, DO  Area at lateral malleolus appears to have insect bite with healing scab. No evidence of cellulitis. Does not appear consistent with SK or AK. Encouraged application of hydrocortisone ointment for pruritis.

## 2018-07-15 NOTE — Telephone Encounter (Signed)
refilled 

## 2018-07-15 NOTE — Telephone Encounter (Signed)
Needs refills on ibuprofen (ADVIL,MOTRIN) 600 MG tablet at [facility] [address]    ;pt contact [phone]

## 2018-07-15 NOTE — Assessment & Plan Note (Addendum)
Patient continues to complain of intermittent cramping of hands and calves. BMET checked at last visit was normal. Re-checked BMET and mag today which were both normal. Encouraged hydration throughout the day and will continue to monitor. Consider adding vitamin B supplementation if symptoms persist.

## 2018-07-15 NOTE — Progress Notes (Signed)
   CC: sore on left ankle  HPI:  Bridget Owens is a 62 y.o. female with PMHx HTN, T2DM who presents for a sore on her left ankle since March. Patient recalls sustaining a bug bite to the area that time. Since then the area has slightly enlarged and has progressively gotten more pruritic. She has not tried any ointments. No swelling, fevers, or other areas on her skin with similar findings.   Patient's blood pressure is elevated at today's visit. She has been out of her anti-hypertensives for 3 weeks. Denies headaches, vision changes, chest pain, shortness of breath.   She also complains of continued intermittent cramping in hands and calves.   Past Medical History:  Diagnosis Date  . Adenomatous colon polyp 6/09   Resected on colonoscopy, no high grade dysplasia.   . Allergic rhinitis   . Bell's palsy 01/2009   Left sided  . Chest pain    Exercise stress test negative 6/07  . Colon polyps    03/22/2008: adenomatous polyps x3 w no dysplasia. Needs repeat colonoscopy in 3 years  . Diabetic peripheral neuropathy ([place])   . External hemorrhoid   . GERD (gastroesophageal reflux disease)   . Hyperlipidemia   . Hypertension   . Hypertensive retinopathy    Followed by Dr. Herbert Deaner.  . Insomnia   . Intolerance, drug    Leg cramps on Lipitor  . Lipoma 12/11   Posterior neck.   . Osteoarthritis    Carpometacarpal joint of right thumb  . Postmenopausal bleeding 9/05   Endometrial biopsy showed  FOCAL TUBAL METAPLASIA   . Postmenopausal symptoms    Hot flashes, vaginal dryness, iritability, difficulty sleeping. as of 2005.  Marland Kitchen Sebaceous cyst of breast    Has been refered to derm.  . Trigger finger    Right thumb  . Type II diabetes mellitus ([place])   . Uterine fibroid     Physical Exam:  Vitals:   07/14/18 1554  BP: (!) 172/84  Pulse: 75  Temp: 98.8 F (37.1 C)  TempSrc: Oral  SpO2: 100%  Weight: 178 lb 11.2 oz (81.1 kg)  Height: 5\' 7"  (1.702 m)   General: alert, pleasant,  appears stated age, NAD Ext: no lower extremity edema Skin: lateral malleolus with 2cm round area of underlying erythema with overlying healing scab; no drainage  Psych: normal mood and affect    Assessment & Plan:   See Encounters Tab for problem based charting.  Patient seen with Dr. Dareen Piano

## 2018-07-16 NOTE — Progress Notes (Signed)
Internal Medicine Clinic Attending  I saw and evaluated the patient.  I personally confirmed the key portions of the history and exam documented by Dr. Bloomfield and I reviewed pertinent patient test results.  The assessment, diagnosis, and plan were formulated together and I agree with the documentation in the resident's note.  

## 2018-08-14 ENCOUNTER — Ambulatory Visit: Payer: Medicaid Other | Admitting: Podiatry

## 2018-08-14 DIAGNOSIS — L84 Corns and callosities: Secondary | ICD-10-CM | POA: Diagnosis not present

## 2018-08-14 DIAGNOSIS — M79674 Pain in right toe(s): Secondary | ICD-10-CM

## 2018-08-14 DIAGNOSIS — M79675 Pain in left toe(s): Secondary | ICD-10-CM | POA: Diagnosis not present

## 2018-08-14 DIAGNOSIS — E1142 Type 2 diabetes mellitus with diabetic polyneuropathy: Secondary | ICD-10-CM

## 2018-08-14 DIAGNOSIS — B351 Tinea unguium: Secondary | ICD-10-CM

## 2018-08-14 NOTE — Patient Instructions (Addendum)
Diabetes and Foot Care Diabetes may cause you to have problems because of poor blood supply (circulation) to your feet and legs. This may cause the skin on your feet to become thinner, break easier, and heal more slowly. Your skin may become dry, and the skin may peel and crack. You may also have nerve damage in your legs and feet causing decreased feeling in them. You may not notice minor injuries to your feet that could lead to infections or more serious problems. Taking care of your feet is one of the most important things you can do for yourself. Follow these instructions at home:  Wear shoes at all times, even in the house. Do not go barefoot. Bare feet are easily injured.  Check your feet daily for blisters, cuts, and redness. If you cannot see the bottom of your feet, use a mirror or ask someone for help.  Wash your feet with warm water (do not use hot water) and mild soap. Then pat your feet and the areas between your toes until they are completely dry. Do not soak your feet as this can dry your skin.  Apply a moisturizing lotion or petroleum jelly (that does not contain alcohol and is unscented) to the skin on your feet and to dry, brittle toenails. Do not apply lotion between your toes.  Trim your toenails straight across. Do not dig under them or around the cuticle. File the edges of your nails with an emery board or nail file.  Do not cut corns or calluses or try to remove them with medicine.  Wear clean socks or stockings every day. Make sure they are not too tight. Do not wear knee-high stockings since they may decrease blood flow to your legs.  Wear shoes that fit properly and have enough cushioning. To break in new shoes, wear them for just a few hours a day. This prevents you from injuring your feet. Always look in your shoes before you put them on to be sure there are no objects inside.  Do not cross your legs. This may decrease the blood flow to your feet.  If you find a  minor scrape, cut, or break in the skin on your feet, keep it and the skin around it clean and dry. These areas may be cleansed with mild soap and water. Do not cleanse the area with peroxide, alcohol, or iodine.  When you remove an adhesive bandage, be sure not to damage the skin around it.  If you have a wound, look at it several times a day to make sure it is healing.  Do not use heating pads or hot water bottles. They may burn your skin. If you have lost feeling in your feet or legs, you may not know it is happening until it is too late.  Make sure your health care provider performs a complete foot exam at least annually or more often if you have foot problems. Report any cuts, sores, or bruises to your health care provider immediately. Contact a health care provider if:  You have an injury that is not healing.  You have cuts or breaks in the skin.  You have an ingrown nail.  You notice redness on your legs or feet.  You feel burning or tingling in your legs or feet.  You have pain or cramps in your legs and feet.  Your legs or feet are numb.  Your feet always feel cold. Get help right away if:  There is increasing   redness, swelling, or pain in or around a wound.  There is a red line that goes up your leg.  Pus is coming from a wound.  You develop a fever or as directed by your health care provider.  You notice a bad smell coming from an ulcer or wound. This information is not intended to replace advice given to you by your health care provider. Make sure you discuss any questions you have with your health care provider. Document Released: 09/21/2000 Document Revised: 03/01/2016 Document Reviewed: 03/03/2013 Elsevier Interactive Patient Education  2017 [employer]  Diabetic Nephropathy Diabetic nephropathy is kidney disease that is caused by diabetes (diabetes mellitus). Kidneys are organs that filter and clean blood and get rid of body waste products and extra fluid.  Diabetes can cause gradual kidney damage over many years. Diabetic nephropathy that continues to get worse (progress) can lead to kidney failure. What are the causes? This condition is caused by kidney damage from diabetes that is not well controlled with treatment. Having high blood sugar (glucose) for a long time can damage blood vessels in the kidneys and cause them to thicken and scar. This prevents the kidneys from functioning normally. What increases the risk? This condition is more likely to develop in people with diabetes who:  Have had diabetes for many years.  Have high blood pressure.  Have high blood glucose levels over a long period of time.  Have a family history of kidney disease.  Have a history of tobacco use.  Have certain genes that are passed from parent to child (inherited).  Are of African-American, Hispanic, Native American, Asian, or [place] descent.  What are the signs or symptoms? This condition may not cause symptoms at first. If you do have symptoms, they may include:  Swelling of your hands, feet, or ankles.  Weakness.  Poor appetite.  Nausea.  Confusion.  Fatigue.  Trouble sleeping.  Dry, itchy skin.  If nephropathy leads to kidney failure, symptoms may include:  Vomiting.  Shortness of breath.  Jerky movements you cannot control (seizure).  Coma.  How is this diagnosed? It is important to diagnose this condition before symptoms develop. You may be screened for diabetic nephropathy at a routine health care visit. Screening tests may include:  Yearly (annual) urine tests.  Urine collection over a 24-hour period to measure kidney function.  Blood tests that measure blood glucose levels and kidney function.  If your health care provider suspects diabetic nephropathy, he or she may:  Review your medical history and symptoms.  Do a physical exam.  Do an ultrasound of your kidneys.  Perform a procedure to take a sample  of kidney tissue for testing (biopsy).  How is this treated? The goal of treatment is to prevent or slow down damage to your kidneys by managing your diabetes. To do this, it is important to control:  Your blood pressure. ? Generally, the goal is to keep your blood pressure below 130/80. Your target blood pressure depends on many factors. ? To help control blood pressure, you may be prescribed medicines to lower blood pressure (ACE inhibitors) or to help your body get rid of excess fluid (diuretics).  Your A1c (hemoglobin A1c) level. Generally, the goal of treatment is to maintain an A1c level of less than 7%.  Your blood glucose level.  Your blood lipids. If you have high cholesterol, you may need to take lipid-lowering drugs, such as statins.  Other treatments may include:  Medicines, including insulin  injections.  Lifestyle changes, such as: ? Losing weight. ? Quitting smoking (smoking cessation).  Changes to your diet, which may include: ? Limiting your salt (sodium), protein, and fluid intake. ? Taking vitamin D supplements.  If your disease progresses to end-stage kidney failure, treatment may include:  Dialysis. This is a procedure to filter your blood with a machine.  Kidney transplant.  Follow these instructions at home: Lifestyle  Maintain a healthy weight. Work with your health care provider to lose weight, if needed.  Do not use any products that contain nicotine or tobacco, such as cigarettes and e-cigarettes. If you need help quitting, ask your health care provider.  Be physically active every day. Ask your health care provider what type of exercise is best for you.  Eat healthy foods, and eat healthy snacks between meals. Follow instructions from your health care provider about eating and drinking restrictions. ? Limit your sodium, protein, or fluid intake as directed.  Work with your health care provider to manage your blood pressure. General  instructions  Follow your diabetes management plan as directed. ? Check your blood glucose levels as directed by your health care provider. ? Keep your blood glucose in your target range as directed by your health care provider. ? Have your A1c level checked at least two times a year, or as often as told by your health care provider.  Take over-the-counter and prescription medicines only as told by your health care provider. This includes insulin and supplements.  Keep all follow-up visits and routine visits as told by your health care provider. This is important. Make sure to get screening tests as directed. Contact a health care provider if:  You have trouble keeping your blood glucose in your goal range.  Your blood glucose level is higher than 240 mg/dL (13.3 mmol/L) for 2 days in a row.  You have swelling in your hands, ankles, or feet.  You feel weak, tired, or dizzy.  You have involuntary muscle tightening (spasms).  You have nausea or vomiting.  You feel tired all the time. Get help right away if:  You are very sleepy.  You have: ? A seizure. ? Severe, painful muscle spasms. ? Shortness of breath. ? Chest pain.  You faint. Summary  Keep your blood glucose in your target range as directed by your health care provider.  Work with your health care provider to manage your blood pressure.  Keep all follow-up visits and routine visits as told by your health care provider. This is important. Make sure to get screening tests as directed. This information is not intended to replace advice given to you by your health care provider. Make sure you discuss any questions you have with your health care provider. Document Released: 10/14/2007 Document Revised: 08/22/2016 Document Reviewed: 08/22/2016 Elsevier Interactive Patient Education  [employer].

## 2018-08-15 ENCOUNTER — Other Ambulatory Visit: Payer: Self-pay | Admitting: Internal Medicine

## 2018-08-15 DIAGNOSIS — E1142 Type 2 diabetes mellitus with diabetic polyneuropathy: Secondary | ICD-10-CM

## 2018-08-18 NOTE — Telephone Encounter (Signed)
refilled 

## 2018-08-31 ENCOUNTER — Encounter: Payer: Self-pay | Admitting: Podiatry

## 2018-08-31 NOTE — Progress Notes (Signed)
Subjective: [name] presents with diabetes, diabetic neuropathy and cc of painful, discolored, thick toenails and painful callus which interfere with activities of daily living. Pain is aggravated when wearing enclosed shoe gear. Pain is relieved with periodic professional debridement.  Objective: Vascular Examination: Capillary refill time <3 seconds x 10 digits Dorsalis pedis and Posterior tibial pulses present as 1/4 b/l No digital hair x 10 digits Skin temperature WNL b/l  Dermatological Examination: Skin with normal turgor, texture and tone b/l  Toenails 1-5 b/l discolored, thick, dystrophic with subungual debris and pain with palpation to nailbeds due to thickness of nails.  Hyperkeratotic lesion submet head 5 left foot  Musculoskeletal: Muscle strength 5/5 to all LE muscle groups  Neurological: Sensation absent with 10 gram monofilament.  Assessment: 1. Painful onychomycosis toenails 1-5 b/l 2. Callus submet head 5 left foot 3. NIDDM with Diabetic neuropathy  Plan: 1. Continue diabetic foot care principles.  2. Toenails 1-5 b/l were debrided in length and girth without iatrogenic bleeding. 3. Hyperkeratotic lesion pared with sterile chisel blade left submet head 5 4. Patient to continue soft, supportive shoe gear 5. Patient to report any pedal injuries to medical professional  6. Follow up 3 months. Patient/POA to call should there be a concern in the interim.

## 2018-09-09 ENCOUNTER — Ambulatory Visit: Payer: Medicaid Other | Admitting: Internal Medicine

## 2018-09-09 ENCOUNTER — Other Ambulatory Visit: Payer: Self-pay

## 2018-09-09 VITALS — BP 171/85 | HR 114 | Temp 100.2°F | Ht 64.0 in | Wt 171.8 lb

## 2018-09-09 DIAGNOSIS — R05 Cough: Secondary | ICD-10-CM

## 2018-09-09 DIAGNOSIS — J111 Influenza due to unidentified influenza virus with other respiratory manifestations: Secondary | ICD-10-CM

## 2018-09-09 DIAGNOSIS — J8410 Pulmonary fibrosis, unspecified: Secondary | ICD-10-CM | POA: Diagnosis not present

## 2018-09-09 DIAGNOSIS — R509 Fever, unspecified: Secondary | ICD-10-CM | POA: Diagnosis not present

## 2018-09-09 DIAGNOSIS — J3489 Other specified disorders of nose and nasal sinuses: Secondary | ICD-10-CM

## 2018-09-09 DIAGNOSIS — R0981 Nasal congestion: Secondary | ICD-10-CM

## 2018-09-09 DIAGNOSIS — M791 Myalgia, unspecified site: Secondary | ICD-10-CM | POA: Diagnosis not present

## 2018-09-09 DIAGNOSIS — E1142 Type 2 diabetes mellitus with diabetic polyneuropathy: Secondary | ICD-10-CM | POA: Diagnosis not present

## 2018-09-09 MED ORDER — OSELTAMIVIR PHOSPHATE 75 MG PO CAPS: 75.0000 mg | ORAL_CAPSULE | Freq: Two times a day (BID) | ORAL | 0 refills | Status: AC

## 2018-09-09 MED ORDER — GUAIFENESIN ER 600 MG PO TB12: 600.0000 mg | ORAL_TABLET | Freq: Two times a day (BID) | ORAL | 0 refills | Status: DC | PRN

## 2018-09-09 NOTE — Patient Instructions (Signed)
Thank you for allowing [place] to provide your care. I have sent out a prescription called Tamiflu and mucinex. Please take these medications as prescribed. Please call you grandchildren's pediatrician to see if they need to be treated for the flu.

## 2018-09-09 NOTE — Progress Notes (Signed)
Internal Medicine Clinic Attending  Case discussed with Dr. Helberg at the time of the visit.  We reviewed the resident's history and exam and pertinent patient test results.  I agree with the assessment, diagnosis, and plan of care documented in the resident's note.    

## 2018-09-09 NOTE — Assessment & Plan Note (Signed)
Patient presented with signs and symptoms consistent with influenza. Although she is over 48 hours since symptom onset given that she has underlying pulmonary fibrosis and exposure to small children feel it is appropriate to proceed with treatment with Tamiflu. I will send out a prescription for Tamiflu 75 mg BID. She will contact her children's pediatrician to determine whether they need treatment. I have sent out a prescription for guaifenesin to help with her congestion. She was advised to avoid over-the-counter medications with DM or dextromethorphan.

## 2018-09-09 NOTE — Progress Notes (Signed)
   CC: Flu-like illness  HPI:  Ms.Genessa L Hoots is a 62 y.o. female with a history of type II diabetes and pulmonary fibrosis who presented the clinic with five days of fevers, chills, rhinorrhea, nasal congestion, cough, myalgias, and arthralgias. She states that her symptoms began the day after Thanksgiving. She lives with her two grandchildren both have been sick were recently given antibiotics for an infection. She is concerned that she came down with the same infection. She is not noticed any diarrhea, dysuria, hematuria, new rash. She does feel that her ears feel full and her left ear continuously pops. She is not tried any over-the-counter medicines because she is not sure which ones will affect her diabetes or her blood pressure.  Past Medical History:  Diagnosis Date  . Adenomatous colon polyp 6/09   Resected on colonoscopy, no high grade dysplasia.   . Allergic rhinitis   . Bell's palsy 01/2009   Left sided  . Chest pain    Exercise stress test negative 6/07  . Colon polyps    03/22/2008: adenomatous polyps x3 w no dysplasia. Needs repeat colonoscopy in 3 years  . Diabetic peripheral neuropathy ([place])   . External hemorrhoid   . GERD (gastroesophageal reflux disease)   . Hyperlipidemia   . Hypertension   . Hypertensive retinopathy    Followed by Dr. Herbert Deaner.  . Insomnia   . Intolerance, drug    Leg cramps on Lipitor  . Lipoma 12/11   Posterior neck.   . Osteoarthritis    Carpometacarpal joint of right thumb  . Postmenopausal bleeding 9/05   Endometrial biopsy showed  FOCAL TUBAL METAPLASIA   . Postmenopausal symptoms    Hot flashes, vaginal dryness, iritability, difficulty sleeping. as of 2005.  Marland Kitchen Sebaceous cyst of breast    Has been refered to derm.  . Trigger finger    Right thumb  . Type II diabetes mellitus ([place])   . Uterine fibroid    Review of Systems:  12 point ROS preformed. All negative aside from those mentioned in the HPI.  Physical Exam: Vitals:   09/09/18 0929  BP: (!) 171/85  Pulse: (!) 114  Temp: 100.2 F (37.9 C)  TempSrc: Oral  SpO2: 95%  Weight: 171 lb 12.8 oz (77.9 kg)  Height: 5\' 4"  (1.626 m)   General: Ill appearing female  HENT: + bilateral conjunctivitis, rhinorrhea, diffuse sinus tenderness, TM clear with good light reflex, no oropharyngeal exudates. Pulm: Good air movement with no wheezing or crackles  CV: Tachycardia, no murmurs, no rubs  Abdomen: Active bowel sounds, soft, non-distended, no tenderness to palpation  Extremities: Pulses palpable in all extremities, no LE edema  Skin: Warm and dry  Neuro: Alert and oriented x 3  Assessment & Plan:   See Encounters Tab for problem based charting.  Patient discussed with Dr. Lynnae January

## 2018-09-22 ENCOUNTER — Telehealth: Payer: Self-pay | Admitting: Internal Medicine

## 2018-09-22 ENCOUNTER — Ambulatory Visit: Payer: Medicaid Other

## 2018-09-22 NOTE — Telephone Encounter (Signed)
Pt unable to come today and has cancelled her appt.  Pt is now requesting a call back about her vaginal itching wants to know if someone can call in some medication.

## 2018-09-23 MED ORDER — FLUCONAZOLE 150 MG PO TABS: 150.0000 mg | ORAL_TABLET | Freq: Every day | ORAL | 0 refills | Status: DC

## 2018-09-23 NOTE — Telephone Encounter (Signed)
I spoke with Bridget Owens, it seems she is having another episode of her vulvovaginal candidiasis.  I agreed to a one time dose of diflucan for treatment

## 2018-10-10 ENCOUNTER — Other Ambulatory Visit: Payer: Self-pay

## 2018-10-10 ENCOUNTER — Ambulatory Visit (INDEPENDENT_AMBULATORY_CARE_PROVIDER_SITE_OTHER): Payer: Medicaid Other | Admitting: Internal Medicine

## 2018-10-10 ENCOUNTER — Other Ambulatory Visit (HOSPITAL_COMMUNITY)
Admission: RE | Admit: 2018-10-10 | Discharge: 2018-10-10 | Disposition: A | Discharge status: Home / Self Care | Payer: Medicaid Other | Source: Ambulatory Visit | Attending: Internal Medicine | Admitting: Internal Medicine

## 2018-10-10 ENCOUNTER — Encounter (INDEPENDENT_AMBULATORY_CARE_PROVIDER_SITE_OTHER): Payer: Self-pay

## 2018-10-10 ENCOUNTER — Encounter: Payer: Self-pay | Admitting: Internal Medicine

## 2018-10-10 VITALS — BP 152/73 | HR 93 | Temp 98.9°F | Wt 170.3 lb

## 2018-10-10 DIAGNOSIS — E1142 Type 2 diabetes mellitus with diabetic polyneuropathy: Secondary | ICD-10-CM

## 2018-10-10 DIAGNOSIS — Z7984 Long term (current) use of oral hypoglycemic drugs: Secondary | ICD-10-CM | POA: Diagnosis not present

## 2018-10-10 DIAGNOSIS — N949 Unspecified condition associated with female genital organs and menstrual cycle: Secondary | ICD-10-CM | POA: Insufficient documentation

## 2018-10-10 DIAGNOSIS — K219 Gastro-esophageal reflux disease without esophagitis: Secondary | ICD-10-CM | POA: Diagnosis not present

## 2018-10-10 DIAGNOSIS — I10 Essential (primary) hypertension: Secondary | ICD-10-CM | POA: Diagnosis not present

## 2018-10-10 DIAGNOSIS — E785 Hyperlipidemia, unspecified: Secondary | ICD-10-CM

## 2018-10-10 LAB — POCT GLYCOSYLATED HEMOGLOBIN (HGB A1C): Hemoglobin A1C: 10.3 % — AB (ref 4.0–5.6)

## 2018-10-10 LAB — GLUCOSE, CAPILLARY: Glucose-Capillary: 236 mg/dL — ABNORMAL HIGH (ref 70–99)

## 2018-10-10 MED ORDER — GLIMEPIRIDE 4 MG PO TABS: 4.0000 mg | ORAL_TABLET | Freq: Every day | ORAL | 1 refills | Status: DC

## 2018-10-10 MED ORDER — IBUPROFEN 600 MG PO TABS: ORAL_TABLET | ORAL | 0 refills | Status: DC

## 2018-10-10 NOTE — Assessment & Plan Note (Addendum)
Patient with complaints of vaginal discomfort, itching, and vaginal discharge.   Pelvic exam reveals dry mucous membranes and only scant gray discharge.  Plan: --f/u wet prep and treat accordingly  Addendum: Wet prep negative for cvag, bvag, GC, Chlamydia, and trichomonas. 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 Discussed with patient and she is in agreement to start vaginal moisturizers for symptoms control for the time being.

## 2018-10-10 NOTE — Assessment & Plan Note (Signed)
Uncontrolled with A1c of 10.3 today. Patient is motivated to better control her diabetes and control her symptoms.   Plan: --continue metformin 1000mg  BID --continue Ozempic 0.5mg  qwkly --restart glimepiride 4mg  daily; can titrate up in future up to 8mg  daily --referral to DM education --patient would like to avoid insulin if possible but understands it may be necessary in the future if lifestyle changes and oral meds are not sufficient --in future, would avoid SGLT2I due to frequent yeast infections; other option would be pioglitazone --has f/u with PCP in 2 mos

## 2018-10-10 NOTE — Progress Notes (Signed)
Internal Medicine Clinic Attending  Case discussed with Dr. Svalina  at the time of the visit.  We reviewed the resident's history and exam and pertinent patient test results.  I agree with the assessment, diagnosis, and plan of care documented in the resident's note.  

## 2018-10-10 NOTE — Patient Instructions (Signed)
I'll call you with the results of your test; it will take a day or two to come back.   I have sent back in glimepiride. Take one pill once a day with breakfast. Continue your metformin twice a day with meals and ozembic injection once a week.

## 2018-10-10 NOTE — Progress Notes (Signed)
   CC: Diabetes  HPI:  [name] is a 63 y.o. with a PMH of HTN, T2DM with peripheral neuropathy, GERD, HLD, presenting to clinic for follow up on diabetes.  Last A1c in June 2019 was 7.9; at that time patient was on metformin 1000mg  BID, glimepiride 5mg  daily and ozempic 0.5mg  every week; since then she states she has run out of glimepiride. She notes increased CBGs at home with associated symptoms of blurry vision, polydipsia, polyuria, and mild nausea without vomiting or abdominal pain. She is not interested in starting insulin therapy at this time until she exhausts all other options.   Patient also endorses over a week h/o of vaginal discomfort and grey vaginal discharge; she denies fevers, chills, dysuria, hematuria. She states it has been years since she's been sexually active and is not worried about an STD. She denies genital skin lesions.   Please see problem based Assessment and Plan for status of patients chronic conditions.  Past Medical History:  Diagnosis Date  . Adenomatous colon polyp 6/09   Resected on colonoscopy, no high grade dysplasia.   . Allergic rhinitis   . Bell's palsy 01/2009   Left sided  . Chest pain    Exercise stress test negative 6/07  . Colon polyps    03/22/2008: adenomatous polyps x3 w no dysplasia. Needs repeat colonoscopy in 3 years  . Diabetic peripheral neuropathy ([place])   . External hemorrhoid   . GERD (gastroesophageal reflux disease)   . Hyperlipidemia   . Hypertension   . Hypertensive retinopathy    Followed by Dr. Herbert Deaner.  . Insomnia   . Intolerance, drug    Leg cramps on Lipitor  . Lipoma 12/11   Posterior neck.   . Osteoarthritis    Carpometacarpal joint of right thumb  . Postmenopausal bleeding 9/05   Endometrial biopsy showed  FOCAL TUBAL METAPLASIA   . Postmenopausal symptoms    Hot flashes, vaginal dryness, iritability, difficulty sleeping. as of 2005.  Marland Kitchen Sebaceous cyst of breast    Has been refered to derm.  .  Trigger finger    Right thumb  . Type II diabetes mellitus ([place])   . Uterine fibroid     Review of Systems:   Per HPI  Physical Exam:  Vitals:   10/10/18 1324  BP: (!) 152/73  Pulse: 93  Temp: 98.9 F (37.2 C)  TempSrc: Oral  SpO2: 99%  Weight: 170 lb 4.8 oz (77.2 kg)   GENERAL- alert, co-operative, appears as stated age, not in any distress. HEENT- oral mucosa appears moist. GU- no external lesions; dryness of vaginal mucosa, scant gray discharge EXTREMITIES- no LE edema edema. SKIN- Warm, no rash or lesion, dry skin on extremities.  Assessment & Plan:   See Encounters Tab for problem based charting.   Patient discussed with Dr. Carmel [place], MD Internal Medicine PGY-3

## 2018-10-13 LAB — CERVICOVAGINAL ANCILLARY ONLY
Bacterial vaginitis: NEGATIVE
Candida vaginitis: NEGATIVE
Chlamydia: NEGATIVE
Neisseria Gonorrhea: NEGATIVE
Trichomonas: NEGATIVE

## 2018-10-13 MED ORDER — VAGISIL FEMININE MOISTURIZER VA LOTN: 1.0000 "application " | TOPICAL_LOTION | VAGINAL | 3 refills | Status: DC

## 2018-10-13 NOTE — Addendum Note (Signed)
Addended by: Alphonzo Grieve on: 10/13/2018 05:07 PM   Modules accepted: Orders

## 2018-10-28 ENCOUNTER — Other Ambulatory Visit: Payer: Self-pay | Admitting: Internal Medicine

## 2018-10-28 DIAGNOSIS — E118 Type 2 diabetes mellitus with unspecified complications: Secondary | ICD-10-CM

## 2018-10-28 DIAGNOSIS — E1142 Type 2 diabetes mellitus with diabetic polyneuropathy: Secondary | ICD-10-CM

## 2018-10-28 NOTE — Telephone Encounter (Signed)
refilled 

## 2018-11-05 ENCOUNTER — Telehealth: Payer: Self-pay

## 2018-11-05 ENCOUNTER — Other Ambulatory Visit: Payer: Self-pay | Admitting: *Deleted

## 2018-11-05 DIAGNOSIS — E1142 Type 2 diabetes mellitus with diabetic polyneuropathy: Secondary | ICD-10-CM

## 2018-11-05 MED ORDER — SEMAGLUTIDE(0.25 OR 0.5MG/DOS) 2 MG/1.5ML ~~LOC~~ SOPN: 0.5000 mg | PEN_INJECTOR | SUBCUTANEOUS | 4 refills | Status: DC

## 2018-11-05 NOTE — Telephone Encounter (Signed)
Requesting to speak with a nurse about med, please call pt back.  

## 2018-11-05 NOTE — Telephone Encounter (Signed)
Pt needed refill, sent in new encounter

## 2018-11-05 NOTE — Telephone Encounter (Signed)
refilled 

## 2018-11-10 ENCOUNTER — Encounter: Payer: Self-pay | Admitting: *Deleted

## 2018-11-13 ENCOUNTER — Ambulatory Visit: Payer: Medicaid Other | Admitting: Podiatry

## 2018-11-19 ENCOUNTER — Encounter: Payer: Self-pay | Admitting: Podiatry

## 2018-11-19 ENCOUNTER — Ambulatory Visit: Payer: Medicaid Other | Admitting: Podiatry

## 2018-11-19 DIAGNOSIS — L84 Corns and callosities: Secondary | ICD-10-CM | POA: Diagnosis not present

## 2018-11-19 DIAGNOSIS — M79674 Pain in right toe(s): Secondary | ICD-10-CM | POA: Diagnosis not present

## 2018-11-19 DIAGNOSIS — M79675 Pain in left toe(s): Secondary | ICD-10-CM

## 2018-11-19 DIAGNOSIS — B351 Tinea unguium: Secondary | ICD-10-CM

## 2018-11-19 DIAGNOSIS — E1142 Type 2 diabetes mellitus with diabetic polyneuropathy: Secondary | ICD-10-CM | POA: Diagnosis not present

## 2018-11-19 NOTE — Patient Instructions (Signed)

## 2018-11-23 NOTE — Progress Notes (Signed)
Subjective: Bridget Owens presents with diabetes, diabetic neuropathy and cc of painful, discolored, thick toenails and painful callus/corn which interfere with activities of daily living. Pain is aggravated when wearing enclosed shoe gear. Pain is relieved with periodic professional debridement.  Katherine Roan, MD is her PCP.    Current Outpatient Medications:  .  ACCU-CHEK SOFTCLIX LANCETS lancets, USE TO check blood sugar 4 times daily. diag code E11.42. Insulin dependent, Disp: 400 each, Rfl: 1 .  albuterol (PROVENTIL HFA) 108 (90 Base) MCG/ACT inhaler, Inhale 1-2 puffs into the lungs every 6 (six) hours as needed for wheezing or shortness of breath., Disp: 1 Inhaler, Rfl: 0 .  amLODipine (NORVASC) 10 MG tablet, Take 1 tablet (10 mg total) by mouth every evening., Disp: 30 tablet, Rfl: 11 .  aspirin 81 MG EC tablet, Take 81 mg by mouth daily.  , Disp: , Rfl:  .  atorvastatin (LIPITOR) 40 MG tablet, TAKE 1 TABLET(40 MG) BY MOUTH DAILY, Disp: 90 tablet, Rfl: 3 .  Blood Glucose Monitoring Suppl (ACCU-CHEK GUIDE) w/Device KIT, USE AS DIRECTED, Disp: 1 kit, Rfl: 0 .  ciclopirox (PENLAC) 8 % solution, Apply topically at bedtime. Apply over nail and surrounding skin. Apply daily over previous coat. After seven (7) days, may remove with alcohol and continue cycle., Disp: 6.6 mL, Rfl: 0 .  fluticasone (FLONASE) 50 MCG/ACT nasal spray, Place 1 spray into both nostrils daily., Disp: 16 g, Rfl: 0 .  gabapentin (NEURONTIN) 300 MG capsule, Take 1 capsule (300 mg total) by mouth at bedtime., Disp: 60 capsule, Rfl: 0 .  glimepiride (AMARYL) 4 MG tablet, Take 1 tablet (4 mg total) by mouth daily with breakfast., Disp: 90 tablet, Rfl: 1 .  glucose blood test strip, CHECK BLOOD SUGAR ONE TIME A DAY, Disp: 90 each, Rfl: 10 .  hydrochlorothiazide (HYDRODIURIL) 25 MG tablet, Take 1 tablet (25 mg total) by mouth daily., Disp: 60 tablet, Rfl: 3 .  ibuprofen (ADVIL,MOTRIN) 600 MG tablet, Can use one tablet 2-3  times per week with food for hip pain., Disp: 30 tablet, Rfl: 0 .  metFORMIN (GLUCOPHAGE) 1000 MG tablet, TAKE 1 TABLET(1000 MG) BY MOUTH TWICE DAILY WITH A MEAL, Disp: 180 tablet, Rfl: 4 .  nicotine (NICODERM CQ - DOSED IN MG/24 HOURS) 14 mg/24hr patch, Apply 1 patch daily. Wear for 24 hours, then remove and replace with new patch. If you have sleep disturbances, remove at bedtime., Disp: 42 patch, Rfl: 0 .  nicotine polacrilex (NICORETTE) 2 MG gum, RX #2 Weeks 7-9: 1 piece every 2-4 hours.Max 24 pieces per day., Disp: 252 each, Rfl: 0 .  pantoprazole (PROTONIX) 40 MG tablet, TAKE ONE TABLET BY MOUTH EVERY DAY, Disp: 90 tablet, Rfl: 1 .  Semaglutide,0.25 or 0.5MG/DOS, (OZEMPIC, 0.25 OR 0.5 MG/DOSE,) 2 MG/1.5ML SOPN, Inject 0.5 mg into the skin once a week., Disp: 3 pen, Rfl: 4 .  Vaginal Moisturizer (VAGISIL FEMININE MOISTURIZER) LOTN, Place 1 application vaginally 3 (three) times a week., Disp: 1 Bottle, Rfl: 3  Allergies  Allergen Reactions  . Codeine Other (See Comments)    "felt funny"    Vascular Examination: Capillary refill time <3 seconds x 10 digits  Dorsalis pedis and Posterior tibial pulses 1/4 b/l  No digital hair x 10 digits  Skin temperature gradient WNL b/l  Dermatological Examination: Skin with normal turgor, texture and tone b/l  Toenails 1-5 b/l discolored, thick, dystrophic with subungual debris and pain with palpation to nailbeds due to thickness of nails.  Hyperkeratotic lesion submet head 5 left foot. No erythema, no edema, no drainage, no flocculence.  Musculoskeletal: Muscle strength 5/5 to all LE muscle groups  Neurological: Sensation diminished with 10 gram monofilament.   Assessment: 1. Painful onychomycosis toenails 1-5 b/l 2. Callus submet head 5 left foot 3. NIDDM with Diabetic neuropathy  Plan: 1. Continue diabetic foot care principles.  2. Toenails 1-5 b/l were debrided in length and girth without iatrogenic bleeding. 3. Hyperkeratotic  lesion pared with sterile scalpel blade submet head 5 left foot without incident. 4. Patient to continue soft, supportive shoe gear 5. Patient to report any pedal injuries to medical professional  6. Follow up 3 months.  7. Patient/POA to call should there be a concern in the interim.

## 2018-12-05 ENCOUNTER — Other Ambulatory Visit: Payer: Self-pay | Admitting: Internal Medicine

## 2018-12-05 NOTE — Telephone Encounter (Signed)
Ask pt to call the insurance company or speak w/ pharmacist about doing an override since she accidentally threw meds away, she is agreeable

## 2018-12-05 NOTE — Telephone Encounter (Signed)
Needs refill on amLODipine (NORVASC) 10 MG tablet [id number] - [place], [place] - [address] ;pt contact [phone]  Accidentally threw away when cleaning up

## 2018-12-08 ENCOUNTER — Encounter: Payer: Medicaid Other | Admitting: Internal Medicine

## 2018-12-08 ENCOUNTER — Encounter: Payer: Medicaid Other | Admitting: Dietician

## 2018-12-11 ENCOUNTER — Ambulatory Visit: Payer: Medicaid Other | Admitting: Podiatry

## 2018-12-22 ENCOUNTER — Encounter: Payer: Medicaid Other | Admitting: Internal Medicine

## 2018-12-22 ENCOUNTER — Encounter: Payer: Medicaid Other | Admitting: Dietician

## 2018-12-23 ENCOUNTER — Telehealth: Payer: Self-pay | Admitting: Dietician

## 2018-12-23 ENCOUNTER — Encounter: Payer: Self-pay | Admitting: Dietician

## 2018-12-23 NOTE — Telephone Encounter (Signed)
Spoke with patient about appointment cancellations. Answered their questions and concerns about diet and nutrition for weight loss.  Debera Lat, RD 12/23/2018 10:47 AM.

## 2018-12-25 ENCOUNTER — Other Ambulatory Visit: Payer: Self-pay | Admitting: *Deleted

## 2018-12-25 DIAGNOSIS — E785 Hyperlipidemia, unspecified: Secondary | ICD-10-CM

## 2018-12-25 MED ORDER — ATORVASTATIN CALCIUM 40 MG PO TABS: ORAL_TABLET | ORAL | 3 refills | Status: DC

## 2018-12-25 NOTE — Telephone Encounter (Signed)
refilled 

## 2018-12-31 ENCOUNTER — Other Ambulatory Visit: Payer: Self-pay

## 2018-12-31 ENCOUNTER — Ambulatory Visit: Payer: Medicaid Other | Admitting: Internal Medicine

## 2018-12-31 VITALS — BP 132/78 | HR 83 | Temp 98.0°F | Wt 173.4 lb

## 2018-12-31 DIAGNOSIS — K644 Residual hemorrhoidal skin tags: Secondary | ICD-10-CM

## 2018-12-31 DIAGNOSIS — L298 Other pruritus: Secondary | ICD-10-CM

## 2018-12-31 DIAGNOSIS — L299 Pruritus, unspecified: Secondary | ICD-10-CM | POA: Insufficient documentation

## 2018-12-31 LAB — GLUCOSE, CAPILLARY: Glucose-Capillary: 107 mg/dL — ABNORMAL HIGH (ref 70–99)

## 2018-12-31 NOTE — Progress Notes (Signed)
   CC: itching in buttocks  HPI:  Bridget Owens is a 63 y.o. female with history noted below that presents to the acute care clinic for 1 year history of anal itching. Please see problem based charting for the status of patient's chronic medical conditions.  Past Medical History:  Diagnosis Date  . Adenomatous colon polyp 6/09   Resected on colonoscopy, no high grade dysplasia.   . Allergic rhinitis   . Bell's palsy 01/2009   Left sided  . Chest pain    Exercise stress test negative 6/07  . Colon polyps    03/22/2008: adenomatous polyps x3 w no dysplasia. Needs repeat colonoscopy in 3 years  . Diabetic peripheral neuropathy ([place])   . External hemorrhoid   . GERD (gastroesophageal reflux disease)   . Hyperlipidemia   . Hypertension   . Hypertensive retinopathy    Followed by Dr. Herbert Deaner.  . Insomnia   . Intolerance, drug    Leg cramps on Lipitor  . Lipoma 12/11   Posterior neck.   . Osteoarthritis    Carpometacarpal joint of right thumb  . Postmenopausal bleeding 9/05   Endometrial biopsy showed  FOCAL TUBAL METAPLASIA   . Postmenopausal symptoms    Hot flashes, vaginal dryness, iritability, difficulty sleeping. as of 2005.  Marland Kitchen Sebaceous cyst of breast    Has been refered to derm.  . Trigger finger    Right thumb  . Type II diabetes mellitus ([place])   . Uterine fibroid     Review of Systems:  Review of Systems  Constitutional: Negative for chills and fever.  Musculoskeletal: Negative for myalgias.     Physical Exam:  Vitals:   12/31/18 1352  BP: 132/78  Pulse: 83  Temp: 98 F (36.7 C)  TempSrc: Oral  SpO2: 98%  Weight: 173 lb 6.4 oz (78.7 kg)   Physical Exam  Constitutional: She is well-developed, well-nourished, and in no distress.  Genitourinary:    Genitourinary Comments: External hemorrhoids Intergluteal cleft without erythema, open wounds, plaques     Skin: Skin is warm and dry.     Assessment & Plan:   See encounters tab for problem based  medical decision making.   Patient discussed with Dr. Evette Doffing

## 2018-12-31 NOTE — Assessment & Plan Note (Signed)
History of present illness: Patient reports a one-year history of chronic proximal intergluteal cleft pruritus that is constant. She states she has tried diaper rash cream with little benefit. She states she takes 1-2 showers a day and tries to keep the area dry and clean. Patient denies any wounds in the area, fever/chills.  Assessment: Proximal intergluteal cleft pruritus, chronic Likely dermatitis.  On exam intergluteal cleft area appears clean and dry.  There are no open wounds or signs of infection.  Hemorrhoids are noted but no other acute abnormalities.  At this time will continue with diaper rash cream and add 1%hydrocortisone cream to use 2 times daily in the area.  Plan -1% hydrocortisone cream -Continue diaper rash cream

## 2018-12-31 NOTE — Progress Notes (Signed)
Internal Medicine Clinic Attending  Case discussed with Dr. Hoffman at the time of the visit.  We reviewed the resident's history and exam and pertinent patient test results.  I agree with the assessment, diagnosis, and plan of care documented in the resident's note.  

## 2018-12-31 NOTE — Patient Instructions (Signed)
Ms. Lesh,  It is a pleasure meeting you today. Please start using 1% hydrocortisone cream in addition to the diaper rash cream to help with your symptoms.

## 2019-01-14 ENCOUNTER — Other Ambulatory Visit: Payer: Self-pay | Admitting: Internal Medicine

## 2019-01-14 DIAGNOSIS — E1142 Type 2 diabetes mellitus with diabetic polyneuropathy: Secondary | ICD-10-CM

## 2019-01-15 ENCOUNTER — Other Ambulatory Visit: Payer: Self-pay | Admitting: *Deleted

## 2019-01-15 DIAGNOSIS — E1142 Type 2 diabetes mellitus with diabetic polyneuropathy: Secondary | ICD-10-CM

## 2019-01-15 MED ORDER — ACCU-CHEK SOFTCLIX LANCETS MISC: 1 refills | Status: DC

## 2019-01-26 ENCOUNTER — Ambulatory Visit: Payer: Medicaid Other | Admitting: Dietician

## 2019-01-26 ENCOUNTER — Other Ambulatory Visit: Payer: Self-pay

## 2019-01-26 ENCOUNTER — Encounter: Payer: Self-pay | Admitting: Dietician

## 2019-01-26 NOTE — Progress Notes (Signed)
Telephone visit due to COVID-19. Referral received for diabetes education from Dr. Jari Favre.  Bridget Owens is a 63 y.o. female who was contacted on behalf of [facility] nutrition and diabetes services  Tried calling this patient. Their voicemail box is not set up. I was unable to leave a message   Debera Lat, RD 01/26/2019 2:06 PM.

## 2019-01-27 ENCOUNTER — Ambulatory Visit: Payer: Medicaid Other | Admitting: Podiatry

## 2019-02-09 ENCOUNTER — Encounter: Payer: Medicaid Other | Admitting: Internal Medicine

## 2019-02-09 ENCOUNTER — Telehealth: Payer: Self-pay | Admitting: *Deleted

## 2019-02-09 ENCOUNTER — Other Ambulatory Visit: Payer: Self-pay

## 2019-02-09 DIAGNOSIS — J302 Other seasonal allergic rhinitis: Secondary | ICD-10-CM

## 2019-02-09 MED ORDER — FLUTICASONE PROPIONATE 50 MCG/ACT NA SUSP: 1.0000 | Freq: Every day | NASAL | 0 refills | Status: DC

## 2019-02-09 NOTE — Telephone Encounter (Signed)
Placed on PCP's schedule today for telehealth appt at 3:15. Attempted to reach patient to notify her of appt. No answer and VM box is not set up. Hubbard Hartshorn, RN, BSN

## 2019-02-09 NOTE — Telephone Encounter (Signed)
Yes - would put her on for a telehealth call - may need antibiotic

## 2019-02-09 NOTE — Telephone Encounter (Signed)
Patient called in c/o intermittent left ear pain x 1 month. At its worst, rates pain 11/10. Also c/o sneezing which increases pain. Patient has hx of allergic rhinitis. Has been out of flonase. Will send request for refill to PCP and Attending. Hubbard Hartshorn, RN, BSN

## 2019-02-09 NOTE — Telephone Encounter (Signed)
Noted. What # did she call from?

## 2019-02-09 NOTE — Telephone Encounter (Signed)
[phone]. Verified this was correct number during original call. Hubbard Hartshorn, RN, BSN

## 2019-02-09 NOTE — Telephone Encounter (Signed)
Called pt no answer voicemail box has not been set up yet went ahead and refilled flonase.  Historically very difficult to get on the phone may be better to see in person if problem persistent and would have the advantage of looking in the ear.

## 2019-02-10 ENCOUNTER — Telehealth: Payer: Self-pay | Admitting: *Deleted

## 2019-02-10 NOTE — Telephone Encounter (Signed)
Medicine attending:  Dr Guadlupe Spanish unable to assist patient who called with complaint of ear pain but then unavailable to answer her phone when called & attempts by our nursing staff to confirm number.

## 2019-02-10 NOTE — Telephone Encounter (Signed)
Pt called and was quite stern stating no one called her back, read her dr winfrey's note, she then said "well give me a appt then" scheduled for 7/11 at

## 2019-02-16 ENCOUNTER — Other Ambulatory Visit: Payer: Self-pay

## 2019-02-16 ENCOUNTER — Ambulatory Visit (INDEPENDENT_AMBULATORY_CARE_PROVIDER_SITE_OTHER): Payer: Medicare Other | Admitting: Internal Medicine

## 2019-02-16 ENCOUNTER — Encounter: Payer: Self-pay | Admitting: Internal Medicine

## 2019-02-16 VITALS — BP 119/67 | HR 82 | Temp 98.0°F | Wt 176.6 lb

## 2019-02-16 DIAGNOSIS — Z7984 Long term (current) use of oral hypoglycemic drugs: Secondary | ICD-10-CM

## 2019-02-16 DIAGNOSIS — H9203 Otalgia, bilateral: Secondary | ICD-10-CM | POA: Diagnosis not present

## 2019-02-16 DIAGNOSIS — F17210 Nicotine dependence, cigarettes, uncomplicated: Secondary | ICD-10-CM

## 2019-02-16 DIAGNOSIS — E1142 Type 2 diabetes mellitus with diabetic polyneuropathy: Secondary | ICD-10-CM

## 2019-02-16 LAB — POCT GLYCOSYLATED HEMOGLOBIN (HGB A1C): Hemoglobin A1C: 6.5 % — AB (ref 4.0–5.6)

## 2019-02-16 LAB — GLUCOSE, CAPILLARY: Glucose-Capillary: 120 mg/dL — ABNORMAL HIGH (ref 70–99)

## 2019-02-16 MED ORDER — GLIMEPIRIDE 4 MG PO TABS: 4.0000 mg | ORAL_TABLET | ORAL | 1 refills | Status: DC

## 2019-02-16 MED ORDER — CETIRIZINE HCL 10 MG PO TABS: 10.0000 mg | ORAL_TABLET | Freq: Every day | ORAL | 0 refills | Status: DC

## 2019-02-16 NOTE — Progress Notes (Signed)
CC: Ear pain, bilateral, T2DM follow up  HPI:  [name] is a 63 y.o. female with PMH below.  Today we will address Ear pain, bilateral, T2DM follow up  Please see A&P for status of the patient's chronic medical conditions  Past Medical History:  Diagnosis Date  . Adenomatous colon polyp 6/09   Resected on colonoscopy, no high grade dysplasia.   . Allergic rhinitis   . Bell's palsy 01/2009   Left sided  . Chest pain    Exercise stress test negative 6/07  . Colon polyps    03/22/2008: adenomatous polyps x3 w no dysplasia. Needs repeat colonoscopy in 3 years  . Diabetic peripheral neuropathy ([place])   . External hemorrhoid   . GERD (gastroesophageal reflux disease)   . Hyperlipidemia   . Hypertension   . Hypertensive retinopathy    Followed by Dr. Herbert Deaner.  . Insomnia   . Intolerance, drug    Leg cramps on Lipitor  . Lipoma 12/11   Posterior neck.   . Osteoarthritis    Carpometacarpal joint of right thumb  . Postmenopausal bleeding 9/05   Endometrial biopsy showed  FOCAL TUBAL METAPLASIA   . Postmenopausal symptoms    Hot flashes, vaginal dryness, iritability, difficulty sleeping. as of 2005.  Marland Kitchen Sebaceous cyst of breast    Has been refered to derm.  . Trigger finger    Right thumb  . Type II diabetes mellitus ([place])   . Uterine fibroid    Review of Systems:  ROS: Pulmonary: pt denies increased work of breathing, shortness of breath,  Cardiac: pt denies palpitations, chest pain,  Abdominal: pt denies abdominal pain, nausea, vomiting, or diarrhea  Physical Exam:  Vitals:   02/16/19 1440 02/16/19 1443  BP:  119/67  Pulse:  82  Temp:  98 F (36.7 C)  TempSrc:  Oral  SpO2:  99%  Weight: 176 lb 9.6 oz (80.1 kg)    Cardiac: normal rate and rhythm, clear s1 and s2, no murmurs, rubs or gallops Pulmonary: CTAB, not in distress Abdominal: non distended abdomen, soft and nontender Extremities: no LE edema Psych: Alert, conversant, in good spirits Ears: right  and left ear show obscuring of the tympanic membranes.  On careful examination this appears to be very old cerumen which could be causing the patient's discomfort.  It is very distal and could not be reached without special instrumentation with camera assistance.     Social History   Socioeconomic History  . Marital status: Single    Spouse name: Not on file  . Number of children: Not on file  . Years of education: Not on file  . Highest education level: Not on file  Occupational History  . Not on file  Social Needs  . Financial resource strain: Not very hard  . Food insecurity:    Worry: Sometimes true    Inability: Sometimes true  . Transportation needs:    Medical: No    Non-medical: No  Tobacco Use  . Smoking status: Current Every Day Smoker    Packs/day: 1.00    Years: 30.00    Pack years: 30.00    Types: Cigarettes  . Smokeless tobacco: Former Systems developer    Quit date: 11/28/2010  . Tobacco comment: .5 PPD  Substance and Sexual Activity  . Alcohol use: No    Alcohol/week: 0.0 standard drinks  . Drug use: No  . Sexual activity: Not on file  Lifestyle  . Physical activity:  Days per week: 7 days    Minutes per session: 50 min  . Stress: Not on file  Relationships  . Social connections:    Talks on phone: Not on file    Gets together: Not on file    Attends religious service: Not on file    Active member of club or organization: Not on file    Attends meetings of clubs or organizations: Not on file    Relationship status: Not on file  . Intimate partner violence:    Fear of current or ex partner: Not on file    Emotionally abused: Not on file    Physically abused: Not on file    Forced sexual activity: Not on file  Other Topics Concern  . Not on file  Social History Narrative  . Not on file    Family History  Problem Relation Age of Onset  . Pneumonia Mother   . Diabetes Mother   . Early death Father   . Diabetes Sister   . Diabetes Brother   . Diabetes  Maternal Grandmother     Assessment & Plan:   See Encounters Tab for problem based charting.  Patient discussed with Dr. Dareen Piano

## 2019-02-16 NOTE — Patient Instructions (Signed)
Ms. Schlotzhauer, we have placed a referral to an ear nose and throat specialist to help clean out your ears.  I have started you on an allergy medicine as well.  Please start taking your glimeperide every other day.  I will call you with the results of your hemoglobin A1C.

## 2019-02-17 NOTE — Assessment & Plan Note (Signed)
Ear pain R>L.  She feels this has been intermittent since February.  March went away didn't think much of it.  April started hurting very bad.  Feels like a pressure mainly in right ear.  Nose has been running every day.  Sneezing a lot and blowing her nose.  She is not taking anything for her allergies currently.  She does take a benadryl intermittently but for sleep.  This has caused her not to sleep well.  She feels this is affecting her balance as well.  Her daughter picked up the flonase  but she has not been able to take it until last night because it was left in her car and someone borrowed the car.  No fevers or chills.  It is alleviated with rubbing it, nothing makes it worse.  Associated symptoms are sneezing and runny nose, headaches and sinus pressure.  Risk factors are T2DM.  TM's are obscured by cerumen but I did not see any signs of infection in the ear canal.  I expect her symptoms are mainly from the cerumen impaction.  -will give pt trial of zyrtec, just started flonase yesterday -ENT referral to clear cerumen with camera guided instrument

## 2019-02-17 NOTE — Assessment & Plan Note (Signed)
T2DM: checking cbg about once daily but difficult to get enough strips.  Last week cbg's running 130, 140, one day was 97.  She did have a low of 67 she says she felt a little weak.  She is currently on ozempic, metformin and amaryl.  Avoiding SGLT2 due to recurrent yeast infections.  Her A1C today is excellent much improved and consistent with her cbg records.    -changed amaryl to every other day pill is too small to reduce dose by 1/2  -now that I have a1c value will attempt to contact patient and stop amaryl altogether.  (historically difficult to get on the phone)

## 2019-02-18 ENCOUNTER — Encounter: Payer: Self-pay | Admitting: Podiatry

## 2019-02-18 ENCOUNTER — Ambulatory Visit: Payer: Medicaid Other | Admitting: Podiatry

## 2019-02-18 ENCOUNTER — Ambulatory Visit (INDEPENDENT_AMBULATORY_CARE_PROVIDER_SITE_OTHER): Payer: Medicare Other | Admitting: Podiatry

## 2019-02-18 ENCOUNTER — Ambulatory Visit (INDEPENDENT_AMBULATORY_CARE_PROVIDER_SITE_OTHER): Payer: Medicare Other

## 2019-02-18 ENCOUNTER — Other Ambulatory Visit: Payer: Self-pay | Admitting: Podiatry

## 2019-02-18 ENCOUNTER — Other Ambulatory Visit: Payer: Self-pay

## 2019-02-18 VITALS — Temp 97.7°F

## 2019-02-18 DIAGNOSIS — M779 Enthesopathy, unspecified: Secondary | ICD-10-CM

## 2019-02-18 DIAGNOSIS — M7752 Other enthesopathy of left foot: Secondary | ICD-10-CM

## 2019-02-18 DIAGNOSIS — E1142 Type 2 diabetes mellitus with diabetic polyneuropathy: Secondary | ICD-10-CM | POA: Diagnosis not present

## 2019-02-18 DIAGNOSIS — M79675 Pain in left toe(s): Secondary | ICD-10-CM | POA: Diagnosis not present

## 2019-02-18 DIAGNOSIS — B351 Tinea unguium: Secondary | ICD-10-CM

## 2019-02-18 DIAGNOSIS — Q828 Other specified congenital malformations of skin: Secondary | ICD-10-CM

## 2019-02-18 DIAGNOSIS — M79672 Pain in left foot: Secondary | ICD-10-CM

## 2019-02-18 DIAGNOSIS — M79674 Pain in right toe(s): Secondary | ICD-10-CM | POA: Diagnosis not present

## 2019-02-18 MED ORDER — AMOXICILLIN 500 MG PO CAPS: 500.0000 mg | ORAL_CAPSULE | Freq: Two times a day (BID) | ORAL | 0 refills | Status: AC

## 2019-02-18 NOTE — Progress Notes (Signed)
Subjective: Bridget Owens presents to clinic for preventative diabetic foot care.  Patient states she was out playing football with her grandchildren when her granddaughter stepped on her left foot when tackling her. She relates the area of her callus has been very painful ever since. She states she can barely walk because it is too tender. She denies any drainage, swelling or redness. She denies any fever, chills, nightsweats, nausea or vomiting. She denies any bruising or inability to move her toes. She has soaked her left foot in epsom salt and warm water, but it has provided no relief.   Bridget Roan, MD is her PCP. Last visit 02/16/2019.   Allergies  Allergen Reactions  . Codeine Other (See Comments)    "felt funny"    Vascular Examination: Capillary refill time <3 seconds  x 10 digits.  Dorsalis pedis pulses palpable 1/4 b/l.   Posterior tibial pulses 1/4 b/l.   Digital hair absent x 10 digits.  Skin temperature gradient WNL b/l.  Dermatological Examination: Skin with normal turgor, texture and tone b/l.  Toenails 1-5 b/l discolored, thick, dystrophic with subungual debris and pain with palpation to nailbeds due to thickness of nails.  Porokeratotic lesions submet 5 left foot with tenderness to palpation. +Exquisite tenderness to lesion. Unable to pare/enucleate without local anesthetic. Post debridement, there is no erythema, no edema, no drainage, no flocculence. No underlying wound discovered.  Porokeratotic lesions submet head 5 right foot with tenderness to palpation. No erythema, no edema, no drainage, no flocculence.  Musculoskeletal: Muscle strength 5/5 to all LE muscle groups.  Neurological: Sensation diminished plantar forefoot 1/5 with 10 gram monofilament b/l.  Xrays left foot negative for bone destruction; negative for fractures.    Assessment: 1. Painful onychomycosis toenails 1-5 b/l 2. Painful porokeratosis submet head 5 b/l 3. Injury left foot  aggravating porokeratotic lesion submet head 5 left foot 4. NIDDM with Diabetic neuropathy  Plan: 1. Continue diabetic foot care principles. Literature dispensed on today. 2. Toenails 1-5 b/l were debrided in length and girth without iatrogenic bleeding. 3. Xrays taken and reviewed with Bridget Owens. Negative for fractures. Negative for bone destruction/infection. 4. Bridget Owens consented to local anesthetic injection in order to pare/enucleate her porokeratotic lesion left foot. Betadine prep was performed surrounding lesion. A 3 cc injection comprised of 50/50 mixture 1% lidocaine plain and 0.5% marcaine plain was administered around periphery of her lesion. Once anesthesia was confirmed, porokeratotic lesion was pared/enucleated. No underlying abscess/wound was discovered. Light bleeding addressed with Lumicain Hemostatic Solution. Betadine Ointment and light dressing applied. She was instructed to apply Neosporin Cream with pain relief to lesion once daily and cover with bandaid.  She was advised to discontinue soaking her foot.   5. Porokeratosis pared/enucleated submet head 5 right foot. 6. Rx for amoxicillin was sent, 500 mg po bid x 1 week. 7. Patient to continue soft, supportive shoe gear daily. 8. Patient to report any pedal injuries to medical professional immediately. 9. Follow up 1 week for recheck of left foot lesion. 10. Advised her to discontinue playing football. She related understanding. 11. Patient/POA to call should there be a concern in the interim.

## 2019-02-18 NOTE — Progress Notes (Signed)
Internal Medicine Clinic Attending  I saw and evaluated the patient.  I personally confirmed the key portions of the history and exam documented by Dr. Winfrey and I reviewed pertinent patient test results.  The assessment, diagnosis, and plan were formulated together and I agree with the documentation in the resident's note. 

## 2019-02-18 NOTE — Patient Instructions (Signed)
Corns and Calluses Corns are small areas of thickened skin that occur on the top, sides, or tip of a toe. They contain a cone-shaped core with a point that can press on a nerve below. This causes pain.  Calluses are areas of thickened skin that can occur anywhere on the body, including the hands, fingers, palms, soles of the feet, and heels. Calluses are usually larger than corns. What are the causes? Corns and calluses are caused by rubbing (friction) or pressure, such as from shoes that are too tight or do not fit properly. What increases the risk? Corns are more likely to develop in people who have misshapen toes (toe deformities), such as hammer toes. Calluses can occur with friction to any area of the skin. They are more likely to develop in people who:  Work with their hands.  Wear shoes that fit poorly, are too tight, or are high-heeled.  Have toe deformities. What are the signs or symptoms? Symptoms of a corn or callus include:  A hard growth on the skin.  Pain or tenderness under the skin.  Redness and swelling.  Increased discomfort while wearing tight-fitting shoes, if your feet are affected. If a corn or callus becomes infected, symptoms may include:  Redness and swelling that gets worse.  Pain.  Fluid, blood, or pus draining from the corn or callus. How is this diagnosed? Corns and calluses may be diagnosed based on your symptoms, your medical history, and a physical exam. How is this treated? Treatment for corns and calluses may include:  Removing the cause of the friction or pressure. This may involve: ? Changing your shoes. ? Wearing shoe inserts (orthotics) or other protective layers in your shoes, such as a corn pad. ? Wearing gloves.  Applying medicine to the skin (topical medicine) to help soften skin in the hardened, thickened areas.  Removing layers of dead skin with a file to reduce the size of the corn or callus.  Removing the corn or callus with a  scalpel or laser.  Taking antibiotic medicines, if your corn or callus is infected.  Having surgery, if a toe deformity is the cause. Follow these instructions at home:   Take over-the-counter and prescription medicines only as told by your health care provider.  If you were prescribed an antibiotic, take it as told by your health care provider. Do not stop taking it even if your condition starts to improve.  Wear shoes that fit well. Avoid wearing high-heeled shoes and shoes that are too tight or too loose.  Wear any padding, protective layers, gloves, or orthotics as told by your health care provider.  Soak your hands or feet and then use a file or pumice stone to soften your corn or callus. Do this as told by your health care provider.  Check your corn or callus every day for symptoms of infection. Contact a health care provider if you:  Notice that your symptoms do not improve with treatment.  Have redness or swelling that gets worse.  Notice that your corn or callus becomes painful.  Have fluid, blood, or pus coming from your corn or callus.  Have new symptoms. Summary  Corns are small areas of thickened skin that occur on the top, sides, or tip of a toe.  Calluses are areas of thickened skin that can occur anywhere on the body, including the hands, fingers, palms, and soles of the feet. Calluses are usually larger than corns.  Corns and calluses are caused by   rubbing (friction) or pressure, such as from shoes that are too tight or do not fit properly.  Treatment may include wearing any padding, protective layers, gloves, or orthotics as told by your health care provider. This information is not intended to replace advice given to you by your health care provider. Make sure you discuss any questions you have with your health care provider. Document Released: 06/30/2004 Document Revised: 08/07/2017 Document Reviewed: 08/07/2017 Elsevier Interactive Patient Education   2019 [employer]. Fungal Nail Infection A fungal nail infection is a common infection of the toenails or fingernails. This condition affects toenails more often than fingernails. It often affects the great, or big, toes. More than one nail may be infected. The condition can be passed from person to person (is contagious). What are the causes? This condition is caused by a fungus. Several types of fungi can cause the infection. These fungi are common in moist and warm areas. If your hands or feet come into contact with the fungus, it may get into a crack in your fingernail or toenail and cause the infection. What increases the risk? The following factors may make you more likely to develop this condition:  Being female.  Being of older age.  Living with someone who has the fungus.  Walking barefoot in areas where the fungus thrives, such as showers or locker rooms.  Wearing shoes and socks that cause your feet to sweat.  Having a nail injury or a recent nail surgery.  Having certain medical conditions, such as: ? Athlete's foot. ? Diabetes. ? Psoriasis. ? Poor circulation. ? A weak body defense system (immune system). What are the signs or symptoms? Symptoms of this condition include:  A pale spot on the nail.  Thickening of the nail.  A nail that becomes yellow or brown.  A brittle or ragged nail edge.  A crumbling nail.  A nail that has lifted away from the nail bed. How is this diagnosed? This condition is diagnosed with a physical exam. Your health care provider may take a scraping or clipping from your nail to test for the fungus. How is this treated? Treatment is not needed for mild infections. If you have significant nail changes, treatment may include:  Antifungal medicines taken by mouth (orally). You may need to take the medicine for several weeks or several months, and you may not see the results for a long time. These medicines can cause side effects. Ask your  health care provider what problems to watch for.  Antifungal nail polish or nail cream. These may be used along with oral antifungal medicines.  Laser treatment of the nail.  Surgery to remove the nail. This may be needed for the most severe infections. It can take a long time, usually up to a year, for the infection to go away. The infection may also come back. Follow these instructions at home: Medicines  Take or apply over-the-counter and prescription medicines only as told by your health care provider.  Ask your health care provider about using over-the-counter mentholated ointment on your nails. Nail care  Trim your nails often.  Wash and dry your hands and feet every day.  Keep your feet dry: ? Wear absorbent socks, and change your socks frequently. ? Wear shoes that allow air to circulate, such as sandals or canvas tennis shoes. Throw out old shoes.  Do not use artificial nails.  If you go to a nail salon, make sure you choose one that uses clean instruments.  Use antifungal foot powder on your feet and in your shoes. General instructions  Do not share personal items, such as towels or nail clippers.  Do not walk barefoot in shower rooms or locker rooms.  Wear rubber gloves if you are working with your hands in wet areas.  Keep all follow-up visits as told by your health care provider. This is important. Contact a health care provider if: Your infection is not getting better or it is getting worse after several months. Summary  A fungal nail infection is a common infection of the toenails or fingernails.  Treatment is not needed for mild infections. If you have significant nail changes, treatment may include taking medicine orally and applying medicine to your nails.  It can take a long time, usually up to a year, for the infection to go away. The infection may also come back.  Take or apply over-the-counter and prescription medicines only as told by your health  care provider.  Follow instructions for taking care of your nails to help prevent infection from coming back or spreading. This information is not intended to replace advice given to you by your health care provider. Make sure you discuss any questions you have with your health care provider. Document Released: 09/21/2000 Document Revised: 02/28/2018 Document Reviewed: 02/28/2018 Elsevier Interactive Patient Education  2019 [employer].

## 2019-02-25 ENCOUNTER — Ambulatory Visit: Payer: Medicaid Other

## 2019-03-03 ENCOUNTER — Ambulatory Visit (INDEPENDENT_AMBULATORY_CARE_PROVIDER_SITE_OTHER): Payer: Medicare Other | Admitting: Podiatry

## 2019-03-03 ENCOUNTER — Other Ambulatory Visit: Payer: Self-pay

## 2019-03-03 DIAGNOSIS — M79672 Pain in left foot: Secondary | ICD-10-CM | POA: Diagnosis not present

## 2019-03-05 NOTE — Progress Notes (Signed)
Bridget Owens is here today for a follow-up to check a lesion on her left foot.  She states the area is a lot better and she is not had any discomfort with it at this time.  Left foot sub-fifth met lesion: Area appears to be free from any signs and symptoms of infection, there is no redness, there is no erythema, there is no swelling.  There are no open lesions, the area has healed over well.  Advised her on signs and symptoms of infection, she is to follow-up as needed with any pain or acute symptom changes.  Reviewed and concur with Nurse's note.  

## 2019-03-08 ENCOUNTER — Encounter: Payer: Self-pay | Admitting: Podiatry

## 2019-03-16 DIAGNOSIS — H9203 Otalgia, bilateral: Secondary | ICD-10-CM | POA: Diagnosis not present

## 2019-03-23 ENCOUNTER — Telehealth: Payer: Self-pay | Admitting: Internal Medicine

## 2019-03-23 NOTE — Telephone Encounter (Signed)
Needs refill on needles, Accu-Chek Softclix Lancets lancets,   Semaglutide,0.25 or 0.5MG /DOS, (OZEMPIC, 0.25 OR 0.5 MG/DOSE,) 2 MG/1.5ML SOPN ;pt contact [place], [place] [facility] [place]

## 2019-03-23 NOTE — Telephone Encounter (Signed)
Lancets # [phone] with 1 refill sent 01/15/2019. Confirmed with Pharmacist at [facility] patient has refills for lancets and ozempic. He will get these ready for patient. Hubbard Hartshorn, RN, BSN

## 2019-03-25 DIAGNOSIS — E113293 Type 2 diabetes mellitus with mild nonproliferative diabetic retinopathy without macular edema, bilateral: Secondary | ICD-10-CM | POA: Diagnosis not present

## 2019-03-25 DIAGNOSIS — H2513 Age-related nuclear cataract, bilateral: Secondary | ICD-10-CM | POA: Diagnosis not present

## 2019-03-25 DIAGNOSIS — H401231 Low-tension glaucoma, bilateral, mild stage: Secondary | ICD-10-CM | POA: Diagnosis not present

## 2019-03-25 LAB — HM DIABETES EYE EXAM

## 2019-03-31 ENCOUNTER — Other Ambulatory Visit: Payer: Self-pay | Admitting: *Deleted

## 2019-03-31 MED ORDER — PEN NEEDLES 31G X 5 MM MISC: 1.0000 | 1 refills | Status: DC

## 2019-03-31 NOTE — Telephone Encounter (Signed)
refilled 

## 2019-04-06 ENCOUNTER — Telehealth: Payer: Self-pay | Admitting: *Deleted

## 2019-04-06 NOTE — Telephone Encounter (Signed)
Call to [employer] for PA for Ozempic.  Information was given. Denied by Medicaid as patient has a Medicare Part D Plan.  [facility] was called and informed of .  Ozempic was approved.  Sander Nephew, RN 04/06/2019 9:55 AM.

## 2019-05-29 ENCOUNTER — Telehealth: Payer: Self-pay | Admitting: Dietician

## 2019-05-29 NOTE — Telephone Encounter (Signed)
Called Bridget Owens to reschedule her missed appointment from March. She made an appointment or Thursday, August 27.  I called  [facility]- she is getting test strips for her accu check guide for 1x/day testing

## 2019-06-01 ENCOUNTER — Other Ambulatory Visit: Payer: Self-pay | Admitting: Internal Medicine

## 2019-06-04 ENCOUNTER — Ambulatory Visit: Payer: Medicare Other | Admitting: Dietician

## 2019-06-05 ENCOUNTER — Ambulatory Visit: Payer: Medicare Other | Admitting: Podiatry

## 2019-06-19 ENCOUNTER — Ambulatory Visit: Payer: Medicare Other

## 2019-06-24 ENCOUNTER — Telehealth: Payer: Self-pay | Admitting: Dietician

## 2019-06-29 ENCOUNTER — Ambulatory Visit (INDEPENDENT_AMBULATORY_CARE_PROVIDER_SITE_OTHER): Payer: Medicare Other | Admitting: Internal Medicine

## 2019-06-29 ENCOUNTER — Other Ambulatory Visit: Payer: Self-pay

## 2019-06-29 ENCOUNTER — Encounter: Payer: Self-pay | Admitting: Internal Medicine

## 2019-06-29 VITALS — BP 166/60 | HR 100 | Temp 98.5°F | Wt 175.5 lb

## 2019-06-29 DIAGNOSIS — E1142 Type 2 diabetes mellitus with diabetic polyneuropathy: Secondary | ICD-10-CM | POA: Diagnosis not present

## 2019-06-29 DIAGNOSIS — I10 Essential (primary) hypertension: Secondary | ICD-10-CM | POA: Diagnosis not present

## 2019-06-29 DIAGNOSIS — J069 Acute upper respiratory infection, unspecified: Secondary | ICD-10-CM | POA: Diagnosis not present

## 2019-06-29 DIAGNOSIS — J302 Other seasonal allergic rhinitis: Secondary | ICD-10-CM

## 2019-06-29 DIAGNOSIS — B9789 Other viral agents as the cause of diseases classified elsewhere: Secondary | ICD-10-CM | POA: Diagnosis not present

## 2019-06-29 LAB — POCT GLYCOSYLATED HEMOGLOBIN (HGB A1C): Hemoglobin A1C: 13.4 % — AB (ref 4.0–5.6)

## 2019-06-29 LAB — GLUCOSE, CAPILLARY: Glucose-Capillary: 450 mg/dL — ABNORMAL HIGH (ref 70–99)

## 2019-06-29 MED ORDER — GUAIFENESIN-DM 100-10 MG/5ML PO SYRP: 5.0000 mL | ORAL_SOLUTION | ORAL | 0 refills | Status: DC | PRN

## 2019-06-29 MED ORDER — PEN NEEDLES 31G X 5 MM MISC: 1.0000 | 1 refills | Status: DC

## 2019-06-29 MED ORDER — CETIRIZINE HCL 10 MG PO TABS: 10.0000 mg | ORAL_TABLET | Freq: Every day | ORAL | 0 refills | Status: DC

## 2019-06-29 MED ORDER — FLUTICASONE PROPIONATE 50 MCG/ACT NA SUSP: 1.0000 | Freq: Every day | NASAL | 0 refills | Status: DC

## 2019-06-29 NOTE — Assessment & Plan Note (Signed)
Pt requires refills on medications with associated diagnosis above.  Reviewed disease process and find this medication to be necessary, will not change dose or alter current therapy. 

## 2019-06-29 NOTE — Progress Notes (Signed)
CC: T2DM, Viral URI, HTN  HPI:  [name] is a 63 y.o. female with PMH below.  Today we will address T2DM, Viral URI, HTN  Please see A&P for status of the patient's chronic medical conditions  Past Medical History:  Diagnosis Date  . Adenomatous colon polyp 6/09   Resected on colonoscopy, no high grade dysplasia.   . Allergic rhinitis   . Bell's palsy 01/2009   Left sided  . Chest pain    Exercise stress test negative 6/07  . Colon polyps    03/22/2008: adenomatous polyps x3 w no dysplasia. Needs repeat colonoscopy in 3 years  . Diabetic peripheral neuropathy ([place])   . External hemorrhoid   . GERD (gastroesophageal reflux disease)   . Hyperlipidemia   . Hypertension   . Hypertensive retinopathy    Followed by Dr. Herbert Owens.  . Insomnia   . Intolerance, drug    Leg cramps on Lipitor  . Lipoma 12/11   Posterior neck.   . Osteoarthritis    Carpometacarpal joint of right thumb  . Postmenopausal bleeding 9/05   Endometrial biopsy showed  FOCAL TUBAL METAPLASIA   . Postmenopausal symptoms    Hot flashes, vaginal dryness, iritability, difficulty sleeping. as of 2005.  Marland Kitchen Sebaceous cyst of breast    Has been refered to derm.  . Trigger finger    Right thumb  . Type II diabetes mellitus ([place])   . Uterine fibroid    Review of Systems: ROS: Pulmonary: pt denies increased work of breathing, shortness of breath,  Cardiac: pt denies palpitations, chest pain,  Abdominal: pt denies abdominal pain, nausea, vomiting, or diarrhea  Physical Exam:  Vitals:   06/29/19 0938  BP: (!) 166/60  Pulse: 100  Temp: 98.5 F (36.9 C)  TempSrc: Oral  SpO2: 97%  Weight: 175 lb 8 oz (79.6 kg)   Cardiac: JVD flat, normal rate and rhythm, clear s1 and s2, no murmurs, rubs or gallops, no LE edema Pulmonary: CTAB, not in distress Psych: Alert, conversant, in good spirits HEENT: rhinorrhea present, no pharyngeal erythema or exudate, no sinus pain with palpation   Social History    Socioeconomic History  . Marital status: Single    Spouse name: Not on file  . Number of children: Not on file  . Years of education: Not on file  . Highest education level: Not on file  Occupational History  . Not on file  Social Needs  . Financial resource strain: Not very hard  . Food insecurity    Worry: Sometimes true    Inability: Sometimes true  . Transportation needs    Medical: No    Non-medical: No  Tobacco Use  . Smoking status: Current Every Day Smoker    Packs/day: 1.00    Years: 30.00    Pack years: 30.00    Types: Cigarettes  . Smokeless tobacco: Former Systems developer    Quit date: 11/28/2010  . Tobacco comment: .5 PPD  Substance and Sexual Activity  . Alcohol use: No    Alcohol/week: 0.0 standard drinks  . Drug use: No  . Sexual activity: Not on file  Lifestyle  . Physical activity    Days per week: 7 days    Minutes per session: 50 min  . Stress: Not on file  Relationships  . Social Herbalist on phone: Not on file    Gets together: Not on file    Attends religious service: Not on file  Active member of club or organization: Not on file    Attends meetings of clubs or organizations: Not on file    Relationship status: Not on file  . Intimate partner violence    Fear of current or ex partner: Not on file    Emotionally abused: Not on file    Physically abused: Not on file    Forced sexual activity: Not on file  Other Topics Concern  . Not on file  Social History Narrative  . Not on file    Family History  Problem Relation Age of Onset  . Pneumonia Mother   . Diabetes Mother   . Early death Father   . Diabetes Sister   . Diabetes Brother   . Diabetes Maternal Grandmother     Assessment & Plan:   See Encounters Tab for problem based charting.  Patient discussed with Dr. Philipp Owens

## 2019-06-29 NOTE — Assessment & Plan Note (Signed)
One week of Rhinorrhea, mostly non productive coughing up occasional clear whitish phlegm.  Feels under the weatherbut not short of breath.  She denies fevers or chills.  She has been taking mucinex and sudafed.  She lost her flonase and zyrtec needs refills.  No exposure to coronavirus but has been around her little granddaughter that had rhinorrhea.    Likely represents viral URI.  -refill flonase, zyrtec -prescribe robitussin DM

## 2019-06-29 NOTE — Assessment & Plan Note (Addendum)
Not taking bp medicine.  Not for any particular reason and confirmed she has medication at home.  Pt has medicare and medicaid. Supposed to be on amlodipine 10mg  and hctz 25mg  daily.    -restart home regimen above -told her to stop otc sudafed

## 2019-06-29 NOTE — Patient Instructions (Signed)
[name], please start back your diabetes and blood pressure medicines.  I will prescribe some medicines today for your cough and runny nose as you likely have a virus.  Please return in one month.  You can buy some voltaren gel for your shoulder.

## 2019-06-29 NOTE — Assessment & Plan Note (Signed)
Last A1C 6.5.  A1C today is 13.4 Says she hasn't been taking any of her medicines for at least a few weeks. Says she hasn't felt like doing anything.  She is on metformin 1000mg  BID, ozempic 0.5mg  weekly.  Has all  her medicine at home but needs some pen needles for the ozempic. She was very hesitant about any type of injection but agreed to a once weekly formulation.  She has been very resistant to insulin in the past and was actually doing well without it a few months ago was even able to d/c her amaryl.    -restart metformin and ozempic -4 week follow up

## 2019-07-01 NOTE — Progress Notes (Signed)
Internal Medicine Clinic Attending  Case discussed with Dr. Winfrey  at the time of the visit.  We reviewed the resident's history and exam and pertinent patient test results.  I agree with the assessment, diagnosis, and plan of care documented in the resident's note.  

## 2019-07-07 ENCOUNTER — Other Ambulatory Visit: Payer: Self-pay | Admitting: Internal Medicine

## 2019-07-07 DIAGNOSIS — I10 Essential (primary) hypertension: Secondary | ICD-10-CM

## 2019-07-14 ENCOUNTER — Other Ambulatory Visit: Payer: Self-pay | Admitting: Internal Medicine

## 2019-07-14 DIAGNOSIS — I10 Essential (primary) hypertension: Secondary | ICD-10-CM

## 2019-07-15 ENCOUNTER — Other Ambulatory Visit: Payer: Self-pay | Admitting: Internal Medicine

## 2019-07-15 DIAGNOSIS — I10 Essential (primary) hypertension: Secondary | ICD-10-CM

## 2019-07-15 DIAGNOSIS — E1142 Type 2 diabetes mellitus with diabetic polyneuropathy: Secondary | ICD-10-CM

## 2019-07-16 ENCOUNTER — Other Ambulatory Visit: Payer: Self-pay | Admitting: Internal Medicine

## 2019-07-16 DIAGNOSIS — I10 Essential (primary) hypertension: Secondary | ICD-10-CM

## 2019-07-16 DIAGNOSIS — E1142 Type 2 diabetes mellitus with diabetic polyneuropathy: Secondary | ICD-10-CM

## 2019-07-17 ENCOUNTER — Encounter: Payer: Self-pay | Admitting: Dietician

## 2019-07-17 NOTE — Telephone Encounter (Signed)
Unable to contact patient by phone and voicemail box is not set up to leave a message. Will mail a letter

## 2019-07-20 ENCOUNTER — Telehealth: Payer: Self-pay | Admitting: Internal Medicine

## 2019-07-20 ENCOUNTER — Encounter: Payer: Self-pay | Admitting: Internal Medicine

## 2019-07-20 NOTE — Telephone Encounter (Signed)
Pt needs appt w/ [facility]. And dr Shan Levans

## 2019-07-20 NOTE — Telephone Encounter (Signed)
Pt missed a call, unsure who call, pls contact [phone]

## 2019-07-21 NOTE — Telephone Encounter (Signed)
Called pt yesterday with no answer per message from Christene Slates and a letter was mailed for the patient to contact the office to sch. . Pt is now sch for 07/27/2019 with Dr.Winfrey and Butch Penny Plyler

## 2019-07-27 ENCOUNTER — Encounter: Payer: Self-pay | Admitting: Internal Medicine

## 2019-07-27 ENCOUNTER — Ambulatory Visit (INDEPENDENT_AMBULATORY_CARE_PROVIDER_SITE_OTHER): Payer: Medicare Other | Admitting: Internal Medicine

## 2019-07-27 ENCOUNTER — Encounter: Payer: Medicare Other | Admitting: Dietician

## 2019-07-27 ENCOUNTER — Other Ambulatory Visit: Payer: Self-pay

## 2019-07-27 VITALS — BP 132/79 | HR 88 | Temp 98.0°F | Ht 67.0 in | Wt 178.5 lb

## 2019-07-27 DIAGNOSIS — L659 Nonscarring hair loss, unspecified: Secondary | ICD-10-CM

## 2019-07-27 DIAGNOSIS — Z7984 Long term (current) use of oral hypoglycemic drugs: Secondary | ICD-10-CM

## 2019-07-27 DIAGNOSIS — E118 Type 2 diabetes mellitus with unspecified complications: Secondary | ICD-10-CM | POA: Diagnosis not present

## 2019-07-27 DIAGNOSIS — Z79899 Other long term (current) drug therapy: Secondary | ICD-10-CM | POA: Diagnosis not present

## 2019-07-27 DIAGNOSIS — Z720 Tobacco use: Secondary | ICD-10-CM | POA: Diagnosis not present

## 2019-07-27 DIAGNOSIS — E119 Type 2 diabetes mellitus without complications: Secondary | ICD-10-CM | POA: Diagnosis not present

## 2019-07-27 DIAGNOSIS — Z1159 Encounter for screening for other viral diseases: Secondary | ICD-10-CM | POA: Diagnosis not present

## 2019-07-27 DIAGNOSIS — Z1231 Encounter for screening mammogram for malignant neoplasm of breast: Secondary | ICD-10-CM

## 2019-07-27 DIAGNOSIS — I10 Essential (primary) hypertension: Secondary | ICD-10-CM | POA: Diagnosis not present

## 2019-07-27 LAB — GLUCOSE, CAPILLARY: Glucose-Capillary: 157 mg/dL — ABNORMAL HIGH (ref 70–99)

## 2019-07-27 MED ORDER — GLUCOSE BLOOD VI STRP: ORAL_STRIP | 10 refills | Status: DC

## 2019-07-27 NOTE — Patient Instructions (Signed)
[name] Please use flonase as described below.  I have ordered some labs to make sure there are no other reasons for your hair loss.  Please continue your current medications for blood pressure and diabetes.  Please bring your meter at your next visit.

## 2019-07-27 NOTE — Assessment & Plan Note (Signed)
Patient is now well controlled after starting back her amlodipine 10mg  and hctz 25mg  daily.    -check urine microalbumin/cr if proteinuria will switch to ARB if not continue current regimen.

## 2019-07-27 NOTE — Assessment & Plan Note (Signed)
Pt doing well since starting back metformin and ozempic.  Cbg 157 much improved today compared to last month.  She reports good readings at home but did not bring her meter.  She is walking with her daughter for exercise actually had a symptomatic low with exercise but she had not eaten beforehand cbg 55.    -continue current regimen will hold off on increasing ozempic for now -repeat a1c at next visit -refilled test strips

## 2019-07-27 NOTE — Assessment & Plan Note (Signed)
Pt reports gradual hair loss since menopause in her early 65's.  No history of trauma to the area.  This is likely age related hair loss but she would like further assessment.  Last TSH normal three years ago.  -will check ferritin, TSH and RPR

## 2019-07-27 NOTE — Progress Notes (Signed)
CC: Hair loss, HTN, T2DM follow up, hepatits C screening, breast cancer screening  HPI:  [name] is a 63 y.o. female with PMH below.  Today we will address Hair loss, HTN, T2DM follow up, Hair loss, HTN, T2DM follow up, hepatits C screening, breast cancer screening  Please see A&P for status of the patient's chronic medical conditions  Past Medical History:  Diagnosis Date  . Adenomatous colon polyp 6/09   Resected on colonoscopy, no high grade dysplasia.   . Allergic rhinitis   . Bell's palsy 01/2009   Left sided  . Chest pain    Exercise stress test negative 6/07  . Colon polyps    03/22/2008: adenomatous polyps x3 w no dysplasia. Needs repeat colonoscopy in 3 years  . Diabetic peripheral neuropathy ([place])   . External hemorrhoid   . GERD (gastroesophageal reflux disease)   . Hyperlipidemia   . Hypertension   . Hypertensive retinopathy    Followed by Dr. Herbert Deaner.  . Insomnia   . Intolerance, drug    Leg cramps on Lipitor  . Lipoma 12/11   Posterior neck.   . Osteoarthritis    Carpometacarpal joint of right thumb  . Postmenopausal bleeding 9/05   Endometrial biopsy showed  FOCAL TUBAL METAPLASIA   . Postmenopausal symptoms    Hot flashes, vaginal dryness, iritability, difficulty sleeping. as of 2005.  Marland Kitchen Sebaceous cyst of breast    Has been refered to derm.  . Trigger finger    Right thumb  . Type II diabetes mellitus ([place])   . Uterine fibroid    Review of Systems:  ROS: Pulmonary: pt denies increased work of breathing, shortness of breath,  Cardiac: pt denies palpitations, chest pain,  Abdominal: pt denies abdominal pain, nausea, vomiting, or diarrhea   Physical Exam:  Vitals:   07/27/19 1354  BP: 132/79  Pulse: 88  Temp: 98 F (36.7 C)  TempSrc: Oral  SpO2: 98%  Weight: 178 lb 8 oz (81 kg)  Height: 5\' 7"  (1.702 m)   Cardiac: JVD flat, normal rate and rhythm, clear s1 and s2, no murmurs, rubs or gallops, no LE edema Pulmonary: CTAB, not in  distress Scalp: hair loss noted mainly on crown of head with no apparent scarring or skin abnormality Psych: Alert, conversant, in good spirits   Social History   Socioeconomic History  . Marital status: Single    Spouse name: Not on file  . Number of children: Not on file  . Years of education: Not on file  . Highest education level: Not on file  Occupational History  . Not on file  Social Needs  . Financial resource strain: Not very hard  . Food insecurity    Worry: Sometimes true    Inability: Sometimes true  . Transportation needs    Medical: No    Non-medical: No  Tobacco Use  . Smoking status: Current Every Day Smoker    Packs/day: 1.00    Years: 30.00    Pack years: 30.00    Types: Cigarettes  . Smokeless tobacco: Former Systems developer    Quit date: 11/28/2010  . Tobacco comment: sometimes less.  Substance and Sexual Activity  . Alcohol use: No    Alcohol/week: 0.0 standard drinks  . Drug use: No  . Sexual activity: Not on file  Lifestyle  . Physical activity    Days per week: 7 days    Minutes per session: 50 min  . Stress: Not on file  Relationships  . Social Herbalist on phone: Not on file    Gets together: Not on file    Attends religious service: Not on file    Active member of club or organization: Not on file    Attends meetings of clubs or organizations: Not on file    Relationship status: Not on file  . Intimate partner violence    Fear of current or ex partner: Not on file    Emotionally abused: Not on file    Physically abused: Not on file    Forced sexual activity: Not on file  Other Topics Concern  . Not on file  Social History Narrative  . Not on file   Family History  Problem Relation Age of Onset  . Pneumonia Mother   . Diabetes Mother   . Early death Father   . Diabetes Sister   . Diabetes Brother   . Diabetes Maternal Grandmother     Assessment & Plan:   See Encounters Tab for problem based charting.  Patient discussed  with Dr. Rebeca Alert

## 2019-07-28 LAB — MICROALBUMIN / CREATININE URINE RATIO
Creatinine, Urine: 43.6 mg/dL
Microalb/Creat Ratio: 8 mg/g creat (ref 0–29)
Microalbumin, Urine: 3.4 ug/mL

## 2019-07-28 LAB — TSH: TSH: 2.45 u[IU]/mL (ref 0.450–4.500)

## 2019-07-28 LAB — BMP8+ANION GAP
Anion Gap: 19 mmol/L — ABNORMAL HIGH (ref 10.0–18.0)
BUN/Creatinine Ratio: 17 (ref 12–28)
BUN: 13 mg/dL (ref 8–27)
CO2: 20 mmol/L (ref 20–29)
Calcium: 10.4 mg/dL — ABNORMAL HIGH (ref 8.7–10.3)
Chloride: 100 mmol/L (ref 96–106)
Creatinine, Ser: 0.78 mg/dL (ref 0.57–1.00)
GFR calc Af Amer: 92 mL/min/{1.73_m2} (ref >59)
GFR calc non Af Amer: 80 mL/min/{1.73_m2} (ref >59)
Glucose: 137 mg/dL — ABNORMAL HIGH (ref 65–99)
Potassium: 4.1 mmol/L (ref 3.5–5.2)
Sodium: 139 mmol/L (ref 134–144)

## 2019-07-28 LAB — HEPATITIS C ANTIBODY: Hep C Virus Ab: <0.1 s/co ratio (ref 0.0–0.9)

## 2019-07-28 LAB — FERRITIN: Ferritin: 45 ng/mL (ref 15–150)

## 2019-07-28 LAB — RPR: RPR Ser Ql: NONREACTIVE

## 2019-07-29 ENCOUNTER — Telehealth: Payer: Self-pay | Admitting: Internal Medicine

## 2019-07-29 NOTE — Telephone Encounter (Signed)
Spoke with patient about lab results from 10/19.  Hair loss likely age related hair loss.

## 2019-07-29 NOTE — Progress Notes (Signed)
[facility] Attending  Case discussed with Dr. Winfrey at the time of the visit.  We reviewed the resident's history and exam and pertinent patient test results.  I agree with the assessment, diagnosis, and plan of care documented in the resident's note.  Alexander Raines, M.D., Ph.D.  

## 2019-08-20 ENCOUNTER — Other Ambulatory Visit: Payer: Self-pay | Admitting: Internal Medicine

## 2019-08-20 DIAGNOSIS — E1142 Type 2 diabetes mellitus with diabetic polyneuropathy: Secondary | ICD-10-CM

## 2019-09-08 ENCOUNTER — Ambulatory Visit: Payer: Medicare Other

## 2019-10-10 ENCOUNTER — Other Ambulatory Visit: Payer: Self-pay | Admitting: Internal Medicine

## 2019-10-10 DIAGNOSIS — E1142 Type 2 diabetes mellitus with diabetic polyneuropathy: Secondary | ICD-10-CM

## 2019-10-23 ENCOUNTER — Encounter: Payer: Self-pay | Admitting: Internal Medicine

## 2019-10-23 ENCOUNTER — Telehealth: Payer: Self-pay | Admitting: *Deleted

## 2019-10-23 ENCOUNTER — Ambulatory Visit: Payer: Medicare Other

## 2019-10-23 ENCOUNTER — Other Ambulatory Visit: Payer: Self-pay

## 2019-10-23 ENCOUNTER — Other Ambulatory Visit: Payer: Self-pay | Admitting: Internal Medicine

## 2019-10-23 ENCOUNTER — Telehealth: Payer: Self-pay | Admitting: Internal Medicine

## 2019-10-23 DIAGNOSIS — E1142 Type 2 diabetes mellitus with diabetic polyneuropathy: Secondary | ICD-10-CM

## 2019-10-23 MED ORDER — OZEMPIC (0.25 OR 0.5 MG/DOSE) 2 MG/1.5ML ~~LOC~~ SOPN: 0.5000 mg | PEN_INJECTOR | SUBCUTANEOUS | 0 refills | Status: DC

## 2019-10-23 NOTE — Telephone Encounter (Signed)
Please call pt, she states her cbg's are in 300's and she is upset appt Tuesday 1/19 at 1315

## 2019-10-23 NOTE — Telephone Encounter (Signed)
Patient would like a call back.  States her sugars are running high.

## 2019-10-23 NOTE — Progress Notes (Signed)
   Reason for call:   I received a call from [name] at 4 PM indicating glucose around 290.   Pertinent Data:   Her glucose has been increased the past week in the 250-320 range, although it goes as low as 107. She denies increased urinary frequency, polydipsia, abdominal pain, cough, pain, recent illness or sick contacts. She has been taking her metformin 1000mg  bid and ozempic .5 mcg qweekly on Sundays. She has felt more fatigued recently but has no focal or diffuse weakness, denies dizziness or changes in BM. She has a clinic appointment scheduled for next Tuesday. Her lowest glucose has been 107, which was this week. She has been drinking sodas a few times a week.    Assessment / Plan / Recommendations:   Increase ozempic to .74mcg for one time dose when it is due Sunday  F/u in clinic Tuesday, call clinic line earlier if glucose goes >450  Advised to avoid sodas and other sugary foods, especially for the next two days.   As always, pt is advised that if symptoms worsen or new symptoms arise, they should go to an urgent care facility or to to ER for further evaluation.   Andy Allende A, DO   10/23/2019, 4:38 PM

## 2019-10-23 NOTE — Telephone Encounter (Signed)
Started new encounter

## 2019-10-27 ENCOUNTER — Ambulatory Visit (INDEPENDENT_AMBULATORY_CARE_PROVIDER_SITE_OTHER): Payer: Medicare Other | Admitting: Internal Medicine

## 2019-10-27 ENCOUNTER — Encounter: Payer: Self-pay | Admitting: Internal Medicine

## 2019-10-27 ENCOUNTER — Other Ambulatory Visit: Payer: Self-pay

## 2019-10-27 VITALS — BP 127/63 | HR 90 | Temp 98.5°F | Ht 64.0 in | Wt 178.2 lb

## 2019-10-27 DIAGNOSIS — E118 Type 2 diabetes mellitus with unspecified complications: Secondary | ICD-10-CM

## 2019-10-27 DIAGNOSIS — M79642 Pain in left hand: Secondary | ICD-10-CM | POA: Insufficient documentation

## 2019-10-27 LAB — POCT URINALYSIS DIPSTICK
Bilirubin, UA: NEGATIVE
Blood, UA: NEGATIVE
Glucose, UA: POSITIVE — AB
Ketones, UA: NEGATIVE
Leukocytes, UA: NEGATIVE
Nitrite, UA: NEGATIVE
Protein, UA: NEGATIVE
Spec Grav, UA: 1.015 (ref 1.010–1.025)
Urobilinogen, UA: 0.2 E.U./dL
pH, UA: 6 (ref 5.0–8.0)

## 2019-10-27 LAB — BASIC METABOLIC PANEL
Anion gap: 11 (ref 5–15)
BUN: 12 mg/dL (ref 8–23)
CO2: 26 mmol/L (ref 22–32)
Calcium: 10 mg/dL (ref 8.9–10.3)
Chloride: 96 mmol/L — ABNORMAL LOW (ref 98–111)
Creatinine, Ser: 0.79 mg/dL (ref 0.44–1.00)
GFR calc Af Amer: >60 mL/min (ref >60)
GFR calc non Af Amer: >60 mL/min (ref >60)
Glucose, Bld: 420 mg/dL — ABNORMAL HIGH (ref 70–99)
Potassium: 4.3 mmol/L (ref 3.5–5.1)
Sodium: 133 mmol/L — ABNORMAL LOW (ref 135–145)

## 2019-10-27 LAB — POCT GLYCOSYLATED HEMOGLOBIN (HGB A1C): Hemoglobin A1C: 9.6 % — AB (ref 4.0–5.6)

## 2019-10-27 LAB — GLUCOSE, CAPILLARY: Glucose-Capillary: 394 mg/dL — ABNORMAL HIGH (ref 70–99)

## 2019-10-27 MED ORDER — GLIMEPIRIDE 4 MG PO TABS: 4.0000 mg | ORAL_TABLET | Freq: Every day | ORAL | 3 refills | Status: DC

## 2019-10-27 NOTE — Progress Notes (Signed)
   CC: High blood sugars, left hand pain  HPI:  [name] is a 64 y.o. female with PMHx listed below presenting for High blood sugars, left hand pain. Please see the A&P for the status of the patient's chronic medical problems.  Past Medical History:  Diagnosis Date  . Adenomatous colon polyp 6/09   Resected on colonoscopy, no high grade dysplasia.   . Allergic rhinitis   . Bell's palsy 01/2009   Left sided  . Chest pain    Exercise stress test negative 6/07  . Colon polyps    03/22/2008: adenomatous polyps x3 w no dysplasia. Needs repeat colonoscopy in 3 years  . Diabetic peripheral neuropathy ([place])   . External hemorrhoid   . GERD (gastroesophageal reflux disease)   . Hyperlipidemia   . Hypertension   . Hypertensive retinopathy    Followed by Dr. Herbert Deaner.  . Insomnia   . Intolerance, drug    Leg cramps on Lipitor  . Lipoma 12/11   Posterior neck.   . Osteoarthritis    Carpometacarpal joint of right thumb  . Postmenopausal bleeding 9/05   Endometrial biopsy showed  FOCAL TUBAL METAPLASIA   . Postmenopausal symptoms    Hot flashes, vaginal dryness, iritability, difficulty sleeping. as of 2005.  Marland Kitchen Sebaceous cyst of breast    Has been refered to derm.  . Trigger finger    Right thumb  . Type II diabetes mellitus ([place])   . Uterine fibroid    Review of Systems:  Performed and all others negative.  Physical Exam: Vitals:   10/27/19 1350  BP: 127/63  Pulse: 90  Temp: 98.5 F (36.9 C)  TempSrc: Oral  SpO2: 99%  Weight: 178 lb 3.2 oz (80.8 kg)  Height: 5\' 4"  (1.626 m)   General: Well nourished female in no acute distress Pulm: Good air movement with no wheezing or crackles  CV: RRR, no murmurs, no rubs   Assessment & Plan:   See Encounters Tab for problem based charting.  Patient discussed with Dr. Philipp Ovens

## 2019-10-27 NOTE — Assessment & Plan Note (Signed)
Patient with three months of left hand pain. She states that she was working in the yard with her granddaughter and her hand was slammed against a post. Since that time she has had continuous pain. She is tried over-the-counter pain medications without significant relief. On physical exam she has significant tenderness to palpation of the first MTP.  A/P: - Will obtain left hand films to rule out fracture.

## 2019-10-27 NOTE — Patient Instructions (Signed)
Thank you for allowing [place] to provide your care. Today we're restarting your glimepiride. Please take this as prescribed in addition all your other medications. Try to cut back on the soda and green tea. Continue to work on increasing your exercise. I would like you to come back to see her primary care doctor in three months or sooner if any issues arise.

## 2019-10-27 NOTE — Assessment & Plan Note (Addendum)
Patient with uncontrolled diabetes mellitus. Previous A1c 13.6. Currently on metformin 1000 mg BID and Semaglutide 0.5 mg Qweekly. She states that over the past month she has noticed worsening hyperglycemia. She has been taking all her medications as prescribed and states that she does not miss any doses. She did bring her blood glucose meter with her today. On the few occasions that she has checked her blood sugar has ranged from a low of 189 to a high of 369. She has been drinking soda and sweetened green tea. She otherwise has not modified her diet. She is not exercising.  I reviewed the records from her prior PCP visits. It appears that she was previously well controlled on Semaglutide, metformin, and glimepiride. She has had times where she becomes uncontrolled every time her glimepiride is discontinued.  Currently denies polyuria, polydipsia, infectious symptoms, chest pain, shortness of breath, abdominal pain, nausea/vomiting. She has never been hospitalized for hyperglycemic emergency.  A/P: - UA negative for ketones - STAT BMP  - Continue Metformin and Semaglutide as prescribed.  - Advised dietary changes - Possible she is a MODY type diabetic and needs a sulfonylurea for control. This is supported by the fact that she becomes uncontrolled every time her glimepiride is discontinued. Will restart her glimepiride 4 mg QD - Follow-up with PCP in 3 months

## 2019-10-27 NOTE — Progress Notes (Signed)
Internal Medicine Clinic Attending  Case discussed with Dr. Helberg at the time of the visit.  We reviewed the resident's history and exam and pertinent patient test results.  I agree with the assessment, diagnosis, and plan of care documented in the resident's note.    

## 2019-11-02 ENCOUNTER — Encounter: Payer: Self-pay | Admitting: Internal Medicine

## 2019-11-02 ENCOUNTER — Ambulatory Visit: Payer: Medicare Other | Admitting: Internal Medicine

## 2019-11-02 ENCOUNTER — Ambulatory Visit: Payer: Medicare Other

## 2019-11-02 VITALS — BP 135/79 | HR 91 | Temp 98.4°F | Ht 64.0 in | Wt 176.9 lb

## 2019-11-02 DIAGNOSIS — E118 Type 2 diabetes mellitus with unspecified complications: Secondary | ICD-10-CM

## 2019-11-02 DIAGNOSIS — E1142 Type 2 diabetes mellitus with diabetic polyneuropathy: Secondary | ICD-10-CM

## 2019-11-02 MED ORDER — GLUCOSE BLOOD VI STRP: ORAL_STRIP | 10 refills | Status: DC

## 2019-11-02 MED ORDER — ACCU-CHEK SOFTCLIX LANCETS MISC: 1 refills | Status: DC

## 2019-11-02 NOTE — Progress Notes (Signed)
Patient presented for appointment to evaluate her hyperglycemia. This was addressed on 1/19. Since starting the glimiperdie her blood sugars have come down to less than 200. She is tolerating the medication well. She is not had any hypoglycemia. She is only checking her blood sugars once a day. She continues to drink at least one bottle of sweet green tea per day.  Discussed continuing her current medications without any changes. She will follow-up with her PCP in three months or sooner if any issues arise.

## 2019-11-03 ENCOUNTER — Telehealth: Payer: Self-pay | Admitting: Dietician

## 2019-11-03 DIAGNOSIS — E118 Type 2 diabetes mellitus with unspecified complications: Secondary | ICD-10-CM

## 2019-11-03 NOTE — Telephone Encounter (Signed)
Ms. Grenier called for an appointment to help her with her diabetes. Scheduled for 11/04/19 at 10:15 PM. Request referral.

## 2019-11-04 NOTE — Progress Notes (Signed)
Internal Medicine Clinic Attending  Case discussed with Dr. Helberg at the time of the visit.  We reviewed the resident's history and exam and pertinent patient test results.  I agree with the assessment, diagnosis, and plan of care documented in the resident's note.    

## 2019-11-05 ENCOUNTER — Ambulatory Visit: Payer: Medicare Other | Admitting: Dietician

## 2019-11-05 ENCOUNTER — Ambulatory Visit (INDEPENDENT_AMBULATORY_CARE_PROVIDER_SITE_OTHER): Payer: Medicare Other | Admitting: Dietician

## 2019-11-05 ENCOUNTER — Encounter: Payer: Self-pay | Admitting: Dietician

## 2019-11-05 ENCOUNTER — Other Ambulatory Visit: Payer: Self-pay | Admitting: Dietician

## 2019-11-05 DIAGNOSIS — Z6830 Body mass index (BMI) 30.0-30.9, adult: Secondary | ICD-10-CM | POA: Diagnosis not present

## 2019-11-05 DIAGNOSIS — Z713 Dietary counseling and surveillance: Secondary | ICD-10-CM | POA: Diagnosis not present

## 2019-11-05 DIAGNOSIS — E118 Type 2 diabetes mellitus with unspecified complications: Secondary | ICD-10-CM

## 2019-11-05 LAB — GLUCOSE, CAPILLARY: Glucose-Capillary: 207 mg/dL — ABNORMAL HIGH (ref 70–99)

## 2019-11-05 MED ORDER — ACCU-CHEK FASTCLIX LANCETS MISC: 3 refills | Status: AC

## 2019-11-05 MED ORDER — ACCU-CHEK GUIDE VI STRP: ORAL_STRIP | 3 refills | Status: DC

## 2019-11-05 NOTE — Telephone Encounter (Signed)
Needs more test strips

## 2019-11-05 NOTE — Patient Instructions (Addendum)
What you said you will do in the next 2 weeks to lower your blood sugar and weight- 1- Drink more drinks without less sugar-  Water, tea, coffee, milk or Store brand SELTZER WATER  2- eat more vegetables by eating then at least 2 times a day  3- do more exercise start tonight- walk indoors and climb steps  4- Eat fruit up to 3 servings a day   Back Exercises These exercises help to make your trunk and back strong. They also help to keep the lower back flexible. Doing these exercises can help to prevent back pain or lessen existing pain.  If you have back pain, try to do these exercises 2-3 times each day or as told by your doctor.  As you get better, do the exercises once each day. Repeat the exercises more often as told by your doctor.  To stop back pain from coming back, do the exercises once each day, or as told by your doctor. Exercises Single knee to chest Do these steps 3-5 times in a row for each leg: 1. Lie on your back on a firm bed or the floor with your legs stretched out. 2. Bring one knee to your chest. 3. Grab your knee or thigh with both hands and hold them it in place. 4. Pull on your knee until you feel a gentle stretch in your lower back or buttocks. 5. Keep doing the stretch for 10-30 seconds. 6. Slowly let go of your leg and straighten it. Pelvic tilt Do these steps 5-10 times in a row: 1. Lie on your back on a firm bed or the floor with your legs stretched out. 2. Bend your knees so they point up to the ceiling. Your feet should be flat on the floor. 3. Tighten your lower belly (abdomen) muscles to press your lower back against the floor. This will make your tailbone point up to the ceiling instead of pointing down to your feet or the floor. 4. Stay in this position for 5-10 seconds while you gently tighten your muscles and breathe evenly. Cat-cow Do these steps until your lower back bends more easily: 1. Get on your hands and knees on a firm surface. Keep  your hands under your shoulders, and keep your knees under your hips. You may put padding under your knees. 2. Let your head hang down toward your chest. Tighten (contract) the muscles in your belly. Point your tailbone toward the floor so your lower back becomes rounded like the back of a cat. 3. Stay in this position for 5 seconds. 4. Slowly lift your head. Let the muscles of your belly relax. Point your tailbone up toward the ceiling so your back forms a sagging arch like the back of a cow. 5. Stay in this position for 5 seconds.  Press-ups Do these steps 5-10 times in a row: 1. Lie on your belly (face-down) on the floor. 2. Place your hands near your head, about shoulder-width apart. 3. While you keep your back relaxed and keep your hips on the floor, slowly straighten your arms to raise the top half of your body and lift your shoulders. Do not use your back muscles. You may change where you place your hands in order to make yourself more comfortable. 4. Stay in this position for 5 seconds. 5. Slowly return to lying flat on the floor.  Bridges Do these steps 10 times in a row: 1. Lie on your back on a firm surface. 2. [place]  your knees so they point up to the ceiling. Your feet should be flat on the floor. Your arms should be flat at your sides, next to your body. 3. Tighten your butt muscles and lift your butt off the floor until your waist is almost as high as your knees. If you do not feel the muscles working in your butt and the back of your thighs, slide your feet 1-2 inches farther away from your butt. 4. Stay in this position for 3-5 seconds. 5. Slowly lower your butt to the floor, and let your butt muscles relax. If this exercise is too easy, try doing it with your arms crossed over your chest. Belly crunches Do these steps 5-10 times in a row: 1. Lie on your back on a firm bed or the floor with your legs stretched out. 2. Bend your knees so they point up to the ceiling. Your feet  should be flat on the floor. 3. Cross your arms over your chest. 4. Tip your chin a little bit toward your chest but do not bend your neck. 5. Tighten your belly muscles and slowly raise your chest just enough to lift your shoulder blades a tiny bit off of the floor. Avoid raising your body higher than that, because it can put too much stress on your low back. 6. Slowly lower your chest and your head to the floor. Back lifts Do these steps 5-10 times in a row: 1. Lie on your belly (face-down) with your arms at your sides, and rest your forehead on the floor. 2. Tighten the muscles in your legs and your butt. 3. Slowly lift your chest off of the floor while you keep your hips on the floor. Keep the back of your head in line with the curve in your back. Look at the floor while you do this. 4. Stay in this position for 3-5 seconds. 5. Slowly lower your chest and your face to the floor. Contact a doctor if:  Your back pain gets a lot worse when you do an exercise.  Your back pain does not get better 2 hours after you exercise. If you have any of these problems, stop doing the exercises. Do not do them again unless your doctor says it is okay. Get help right away if:  You have sudden, very bad back pain. If this happens, stop doing the exercises. Do not do them again unless your doctor says it is okay. This information is not intended to replace advice given to you by your health care provider. Make sure you discuss any questions you have with your health care provider. Document Revised: 06/19/2018 Document Reviewed: 06/19/2018 Elsevier Patient Education  2020 [employer].

## 2019-11-05 NOTE — Progress Notes (Signed)
  Medical Nutrition Therapy:  Appt start time: 1030 end time:  1120. Total time: 50 minutes Visit # 1  11/05/2019 [name] [id number]  Assessment:  Primary concerns today: weight loss and blood sugar  Ms. Sprenkle requested today's visit because she is very concerned about her abdominal weight.  I also think she is concerned about her blood sugars, dry hair and sweating. Her blood sugars appear variable over the past 2 years per a1cs. She states she gets $95/month in food stamps. Her stated goal for her blood sugars is 130-200.  Preferred Learning Style: No preference indicated  Learning Readiness: Ready  ANTHROPOMETRICS: Estimated body mass index is 30.35 kg/m as calculated from the following:   Height as of 11/02/19: 5\' 4"  (1.626 m).   Weight as of this encounter: 176 lb 12.8 oz (80.2 kg).  WEIGHT HISTORY:  Wt Readings from Last 5 Encounters:  11/05/19 176 lb 12.8 oz (80.2 kg)  11/02/19 176 lb 14.4 oz (80.2 kg)  10/27/19 178 lb 3.2 oz (80.8 kg)  07/27/19 178 lb 8 oz (81 kg)  06/29/19 175 lb 8 oz (79.6 kg)   SLEEP:need to assess at future visit  MEDICATIONS: reports taking diabetes drugs as ordered, ran out of strips BLOOD SUGAR: 207 after coffee and no meds today METER DOWNLOAD  Report summary is from last 30 days,  Average tests per day: 0.9 Average blood glucose: 298 Range: minimum: 174 and maximum: 484 % in target range: 3.6 % below target range:0 % above target range: 96.4 % hypoglycemia: 0 Notes about patterns: fairly stable over course of day, checks fairly consistently throughout day, mostly before dinner   Lab Results  Component Value Date   HGBA1C 9.6 (A) 10/27/2019   HGBA1C 13.4 (A) 06/29/2019   HGBA1C 6.5 (A) 02/16/2019   HGBA1C 10.3 (A) 10/10/2018   HGBA1C 7.9 (A) 03/11/2018     DIETARY INTAKE: Usual eating pattern includes 2( dislikes breakfast) meals and ? snacks per day. Everyday foods include loves fruit, yogurt,.  Avoided foods include diet  drinks.  Constipation: she reports no problem, but asked about recommended frequency of use of laxatives, Any hair loss: reports her hair is dry Dining Out (times/week): need to assess at future visit  24-hr recall:  B ( AM): coffee with cream  L ( PM): ham sandwich or baked chicken leg D ( PM): corn and peas made with healthy margarine Snacks- up to "10 apples a day", several fruits serving a day, yogurt Beverages: water, coffee, sweet green tea, regular soda  Usual physical activity: walks steps at her daughters, walks to store and around the store  Estimated energy needs: not done today   Progress Towards Goal(s):  In progress.   Nutritional Diagnosis:  NB-1.1 Food and nutrition-related knowledge deficit As related to lack of sufficient prior diabetes and nutrition education.  As evidenced by her report, her continued use of high sugar foods and elevated blood sugars.    Intervention:  Nutrition education about nutrion and diabets . Action Goal: see patient instructions  Outcome goal: decreased weight and blood sugars Coordination of care:   Teaching Method Utilized: Visual, Auditory,Hands on Handouts given during visit include: After visit summary Barriers to learning/adherence to lifestyle change: transportation al;ck of material resources Demonstrated degree of understanding via:  Teach Back   Monitoring/Evaluation:  Dietary intake, exercise, meter, and body weight in 2 week(s)  Debera Lat, RD 11/05/2019 1:31 PM. .

## 2019-11-06 ENCOUNTER — Other Ambulatory Visit: Payer: Self-pay | Admitting: Internal Medicine

## 2019-11-06 NOTE — Telephone Encounter (Signed)
Spoke with [facility]:they need the CMN form.  Found CMN, completed it and faxed it.  Follow up call to [facility]: pharmacy was trying to run it and said they'd call us back. Patient notified.

## 2019-11-06 NOTE — Telephone Encounter (Signed)
Need refill on test strip; pt was here on Monday physician they will sending it; pls contact [phone]

## 2019-11-06 NOTE — Telephone Encounter (Signed)
Per pharmacy pt's test strips need a PA, she is out and needs them asap

## 2019-11-09 ENCOUNTER — Ambulatory Visit (INDEPENDENT_AMBULATORY_CARE_PROVIDER_SITE_OTHER): Payer: Medicare Other | Admitting: Podiatry

## 2019-11-09 ENCOUNTER — Encounter: Payer: Self-pay | Admitting: Podiatry

## 2019-11-09 DIAGNOSIS — M21621 Bunionette of right foot: Secondary | ICD-10-CM | POA: Diagnosis not present

## 2019-11-09 DIAGNOSIS — B351 Tinea unguium: Secondary | ICD-10-CM

## 2019-11-09 DIAGNOSIS — M79674 Pain in right toe(s): Secondary | ICD-10-CM

## 2019-11-09 DIAGNOSIS — M21622 Bunionette of left foot: Secondary | ICD-10-CM

## 2019-11-09 DIAGNOSIS — Q828 Other specified congenital malformations of skin: Secondary | ICD-10-CM

## 2019-11-09 DIAGNOSIS — E119 Type 2 diabetes mellitus without complications: Secondary | ICD-10-CM

## 2019-11-09 DIAGNOSIS — E1142 Type 2 diabetes mellitus with diabetic polyneuropathy: Secondary | ICD-10-CM | POA: Diagnosis not present

## 2019-11-09 NOTE — Patient Instructions (Signed)
Diabetes Mellitus and Foot Care Foot care is an important part of your health, especially when you have diabetes. Diabetes may cause you to have problems because of poor blood flow (circulation) to your feet and legs, which can cause your skin to:  Become thinner and drier.  Break more easily.  Heal more slowly.  Peel and crack. You may also have nerve damage (neuropathy) in your legs and feet, causing decreased feeling in them. This means that you may not notice minor injuries to your feet that could lead to more serious problems. Noticing and addressing any potential problems early is the best way to prevent future foot problems. How to care for your feet Foot hygiene  Wash your feet daily with warm water and mild soap. Do not use hot water. Then, pat your feet and the areas between your toes until they are completely dry. Do not soak your feet as this can dry your skin.  Trim your toenails straight across. Do not dig under them or around the cuticle. File the edges of your nails with an emery board or nail file.  Apply a moisturizing lotion or petroleum jelly to the skin on your feet and to dry, brittle toenails. Use lotion that does not contain alcohol and is unscented. Do not apply lotion between your toes. Shoes and socks  Wear clean socks or stockings every day. Make sure they are not too tight. Do not wear knee-high stockings since they may decrease blood flow to your legs.  Wear shoes that fit properly and have enough cushioning. Always look in your shoes before you put them on to be sure there are no objects inside.  To break in new shoes, wear them for just a few hours a day. This prevents injuries on your feet. Wounds, scrapes, corns, and calluses  Check your feet daily for blisters, cuts, bruises, sores, and redness. If you cannot see the bottom of your feet, use a mirror or ask someone for help.  Do not cut corns or calluses or try to remove them with medicine.  If you  find a minor scrape, cut, or break in the skin on your feet, keep it and the skin around it clean and dry. You may clean these areas with mild soap and water. Do not clean the area with peroxide, alcohol, or iodine.  If you have a wound, scrape, corn, or callus on your foot, look at it several times a day to make sure it is healing and not infected. Check for: ? Redness, swelling, or pain. ? Fluid or blood. ? Warmth. ? Pus or a bad smell. General instructions  Do not cross your legs. This may decrease blood flow to your feet.  Do not use heating pads or hot water bottles on your feet. They may burn your skin. If you have lost feeling in your feet or legs, you may not know this is happening until it is too late.  Protect your feet from hot and cold by wearing shoes, such as at the beach or on hot pavement.  Schedule a complete foot exam at least once a year (annually) or more often if you have foot problems. If you have foot problems, report any cuts, sores, or bruises to your health care provider immediately. Contact a health care provider if:  You have a medical condition that increases your risk of infection and you have any cuts, sores, or bruises on your feet.  You have an injury that is not   healing.  You have redness on your legs or feet.  You feel burning or tingling in your legs or feet.  You have pain or cramps in your legs and feet.  Your legs or feet are numb.  Your feet always feel cold.  You have pain around a toenail. Get help right away if:  You have a wound, scrape, corn, or callus on your foot and: ? You have pain, swelling, or redness that gets worse. ? You have fluid or blood coming from the wound, scrape, corn, or callus. ? Your wound, scrape, corn, or callus feels warm to the touch. ? You have pus or a bad smell coming from the wound, scrape, corn, or callus. ? You have a fever. ? You have a red line going up your leg. Summary  Check your feet every day  for cuts, sores, red spots, swelling, and blisters.  Moisturize feet and legs daily.  Wear shoes that fit properly and have enough cushioning.  If you have foot problems, report any cuts, sores, or bruises to your health care provider immediately.  Schedule a complete foot exam at least once a year (annually) or more often if you have foot problems. This information is not intended to replace advice given to you by your health care provider. Make sure you discuss any questions you have with your health care provider. Document Revised: 06/17/2019 Document Reviewed: 10/26/2016 Elsevier Patient Education  2020 [employer]  

## 2019-11-09 NOTE — Progress Notes (Signed)
Subjective: Bridget Owens presents today for follow up of preventative diabetic foot care and painful callus(es) plantar aspect of both feet.   Patient states her feet were fine until a man stepped on her left foot when she was in the supermarket about a week ago. She denies any redness or swelling of area. State the callus is just more symptomatic. She does not want her toenails debrided as she has had a pedicure.   She continues to be an everyday smoker.  PCP: Dr. Guadlupe Spanish; last date seen 07/27/2019.  Allergies  Allergen Reactions  . Codeine Other (See Comments)    "felt funny"    Objective: There were no vitals filed for this visit.  Vascular Examination:  Capillary fill time to digits <3s b/l, faintly palpable DP pulses b/l, faintly palpable PT pulses b/l, pedal hair absent b/l and skin temperature gradient within normal limits b/l  Dermatological Examination: Pedal skin with normal turgor, texture and tone bilaterally, no open wounds bilaterally, no interdigital macerations bilaterally and porokeratotic lesion(s) submet head 5 b/l, submet head 2, 3 right foot. No erythema, no edema, no drainage, no flocculence. Toenails recently debrided and painted with red nail polish. No erythema, no edema, no drainage from digits.  Musculoskeletal: Normal muscle strength 5/5 to all lower extremity muscle groups bilaterally, no pain crepitus or joint limitation noted with ROM b/l, tailor's bunion deformity noted b/l and ambulates indepently without any assistance.  Neurological: Protective sensation decreased with 10 gram monofilament b/l and vibratory sensation intact b/l  Assessment: 1. Pain due to onychomycosis of toenails of both feet   2. Porokeratosis   3. Diabetic peripheral neuropathy associated with type 2 diabetes mellitus ([place])   4. Tailor's bunion of both feet   5. Encounter for diabetic foot exam ([place])     Plan: -Continue diabetic foot care principles. Literature  dispensed on today.  -Discussed dangers of pedicures with patient. In the event she decides to continue to get pedicures, I advised her to take her own instruments. Do not allow anyone to use sharp blades on her feet, and to take her own nail polish. Advised her she's sharing nail polish with the community as everyone chooses from the same supply of nail polish. She related understanding. -Painful porokeratotic lesions submet head 5 b/l and submet head 2, 3 right foot pared and enucleated with sterile scalpel blade -Patient to continue soft, supportive shoe gear daily. -Patient to report any pedal injuries to medical professional immediately. -Patient/POA to call should there be question/concern in the interim.  Return in about 3 months (around 02/06/2020) for diabetic callus trim.

## 2019-11-19 ENCOUNTER — Encounter: Payer: Self-pay | Admitting: Dietician

## 2019-11-19 ENCOUNTER — Ambulatory Visit (INDEPENDENT_AMBULATORY_CARE_PROVIDER_SITE_OTHER): Payer: Medicare Other | Admitting: Dietician

## 2019-11-19 DIAGNOSIS — E118 Type 2 diabetes mellitus with unspecified complications: Secondary | ICD-10-CM

## 2019-11-19 DIAGNOSIS — Z713 Dietary counseling and surveillance: Secondary | ICD-10-CM | POA: Diagnosis not present

## 2019-11-19 DIAGNOSIS — Z6830 Body mass index (BMI) 30.0-30.9, adult: Secondary | ICD-10-CM | POA: Diagnosis not present

## 2019-11-19 NOTE — Progress Notes (Signed)
Medical Nutrition Therapy:  Appt start time: [id number] end time:  1045. Total time: 50 minutes Visit # 2  11/19/2019 [name] [id number]  Assessment:  Primary concerns today: weight loss and blood sugar  Ms. Bendell presents today for follow up. She bring her meter, reports decreasing her regular soda intake, drinking more water, eating baked foods and fruits vegetables and less starchy foods.  She complains of hot flashes and hunger in the middle of the night, makes her sick like she is going to throw up sometime". She has not been walking as much because it has been  cold and has a callus on her foot. Her weight is decreased 1# and her blood sugar are improved from and average of 305 before our last visit to 190 for the past 2 weeks.   ANTHROPOMETRICS: Estimated body mass index is 30.16 kg/m as calculated from the following:   Height as of 11/02/19: 5\' 4"  (1.626 m).   Weight as of this encounter: 175 lb 11.2 oz (79.7 kg).  WEIGHT HISTORY:  Wt Readings from Last 5 Encounters:  11/19/19 175 lb 11.2 oz (79.7 kg)  11/05/19 176 lb 12.8 oz (80.2 kg)  11/02/19 176 lb 14.4 oz (80.2 kg)  10/27/19 178 lb 3.2 oz (80.8 kg)  07/27/19 178 lb 8 oz (81 kg)   SLEEP:need to assess at future visit  MEDICATIONS: reports taking diabetes drugs as ordered BLOOD SUGAR: 129/125/167 yesterday METER DOWNLOAD  Report summary is from last 30 days,  Average tests per day: 0.9 Average blood glucose: 260 (298) Range: minimum: 120(174) and maximum: 484 % in target range: 20.9 (3.6) % below target range:0 % above target range: 79.1 (96.4) % hypoglycemia: 0 Notes about patterns: fairly stable over course of day, checks fairly consistently throughout day, mostly before dinner   DIETARY INTAKE: Usual eating pattern includes 2(dislikes breakfast) meals and 1-2 snacks per day. Everyday foods include  fruit, yogurt,  Avoided foods include diet drinks.  Any hair loss: reports her hair is dry Dining Out  (times/week): need to assess at future visit   Progress Towards Goal(s):  Some progress.   Nutritional Diagnosis:  NB-1.1 Food and nutrition-related knowledge deficit As related to lack of sufficient prior diabetes and nutrition education.is gradually improving   As evidenced by her report, her decreased weight and blood sugars.    Intervention:  Nutrition education about nutrion and diabetes and lower carb/sugar, increased fiber and healthy fats food choices Action Goal: see patient instructions  Outcome goal: decreased weight and blood sugars Coordination of care: none  Teaching Method Utilized: Visual, Auditory,Hands on Handouts given during visit include: After visit summary Barriers to learning/adherence to lifestyle change: transportation lack of material resources Demonstrated degree of understanding via:  Teach Back   Monitoring/Evaluation:  Dietary intake, exercise, meter, and body weight in 4 week(s)  Debera Lat, RD 11/19/2019 5:51 PM. .

## 2019-11-19 NOTE — Patient Instructions (Addendum)
  Keep eating healthy by less starches more fruits vegetables, nuts and whole grains and fish.   Add flax seed 1-2 tablespoons per day to food as we discussed.    Can buy at [employer]- they all have a different brand of soy milk-   Yogurt- buying the bigger contaner can save money    Can eat quinoa instead of rice.

## 2019-12-11 ENCOUNTER — Encounter: Payer: Self-pay | Admitting: *Deleted

## 2019-12-17 ENCOUNTER — Telehealth: Payer: Self-pay | Admitting: Dietician

## 2019-12-17 ENCOUNTER — Ambulatory Visit: Payer: Medicare Other | Admitting: Dietician

## 2019-12-17 NOTE — Telephone Encounter (Signed)
Called to offer telehealth or rescheduling her appointment.. patient elected to reschedule. She reports blood sugars are " a whole lot better".

## 2019-12-24 ENCOUNTER — Telehealth: Payer: Self-pay | Admitting: Dietician

## 2019-12-24 NOTE — Telephone Encounter (Signed)
Asked about changing her appointment time.

## 2019-12-25 ENCOUNTER — Telehealth: Payer: Self-pay | Admitting: Dietician

## 2019-12-25 ENCOUNTER — Ambulatory Visit: Payer: Medicare Other | Admitting: Dietician

## 2019-12-28 NOTE — Telephone Encounter (Signed)
Reschedule her appointment

## 2019-12-31 ENCOUNTER — Ambulatory Visit: Payer: Medicare Other | Admitting: Dietician

## 2020-01-04 ENCOUNTER — Ambulatory Visit (INDEPENDENT_AMBULATORY_CARE_PROVIDER_SITE_OTHER): Payer: Medicare Other | Admitting: Dietician

## 2020-01-04 ENCOUNTER — Encounter: Payer: Self-pay | Admitting: Dietician

## 2020-01-04 DIAGNOSIS — Z6830 Body mass index (BMI) 30.0-30.9, adult: Secondary | ICD-10-CM | POA: Diagnosis not present

## 2020-01-04 DIAGNOSIS — Z713 Dietary counseling and surveillance: Secondary | ICD-10-CM | POA: Diagnosis not present

## 2020-01-04 DIAGNOSIS — E118 Type 2 diabetes mellitus with unspecified complications: Secondary | ICD-10-CM

## 2020-01-04 NOTE — Patient Instructions (Signed)
Your blood sugars are fantastic!! average is 148   You did this by  Walking more- 30 minutes a day Better food choices- see below   Milk- LACTAID milk would be good for your tummy  Try to eat 1 cup fruit  Each day.  Try to eat 2 cups vegetables each day.   Try to eat a balanced small meal /snack-                         -peanut butter & banana sandwich or                        -baked potato with broccoli and cheese                       -Tuna salad sandwich   Drinking a 8 oz soda is like eating 2 slices bread- there fore I recommend having 1/2 sandwich when you drink a soda  SELTZER WATER. SPARKLING WATER

## 2020-01-04 NOTE — Progress Notes (Signed)
Medical Nutrition Therapy:  Appt start time: [id number] end time:  1400. Total time: 42 minutes Visit # 3  01/04/2020 [name] [id number]  Assessment:  Primary concerns today: weight loss and blood sugar  Ms. Kolman has been checking her blood sugar every other day. There is no change in her weight. She has had significant decrease in her blood sugar. She attributes this to walking and making healthier food choices( less brad, soda, fried foods, processed foods.   Blood sugar are improved: average was 305 before our first visit,  to 190 the second visit and now 148 for the past 30 days. Range is 72 to 211. She does report symptoms of low blood sugar at 72mg /dl ANTHROPOMETRICS: Estimated body mass index is 30.4 kg/m as calculated from the following:   Height as of 11/02/19: 5\' 4"  (1.626 m).   Weight as of this encounter: 177 lb 1.6 oz (80.3 kg).  SLEEP:she reports it is good except when she awkens hungry- eats cereal and goes back to bed  DIETARY INTAKE: Usual eating pattern includes 2(dislikes breakfast) meals and 1-2 snacks per day. Everyday foods include  fruit, yogurt,  Avoided foods include diet drinks.   Dining Out (times/week): seldom 6am- sanka with "little bit of sugar and cream" 11 am- boiled egg, 4 slices [place] bacon, tangerine or cornflakes 3 Pm pineapple and chips or chili beans 8 PM- 8 oz soda, baked sweet potato with margarine and cinnamon, baked fish 2-3 am cold cereal   Progress Towards Goal(s):  Some progress.   Nutritional Diagnosis:  NB-1.1 Food and nutrition-related knowledge deficit As related to lack of sufficient prior diabetes and nutrition education.is gradually improving   As evidenced by her report, her decreased  blood sugars.    Intervention:  Nutrition education about goal setting and need for support Action Goal: see patient instructions  Outcome goal: decreased weight and blood sugars Coordination of care: consider alternative to amaryl as patient  desires wt loss and reports hypoglycemic symptoms at 72.  Teaching Method Utilized: Visual, Auditory,Hands on Handouts given during visit include: After visit summary Barriers to learning/adherence to lifestyle change: transportation lack of material resources Demonstrated degree of understanding via:  Teach Back   Monitoring/Evaluation:  Dietary intake, exercise, meter, and body weight in 4 week(s)  By phone per patient Debera Lat, RD 01/04/2020 2:00 PM. .

## 2020-01-17 ENCOUNTER — Other Ambulatory Visit: Payer: Self-pay | Admitting: Internal Medicine

## 2020-01-17 DIAGNOSIS — E1142 Type 2 diabetes mellitus with diabetic polyneuropathy: Secondary | ICD-10-CM

## 2020-01-28 ENCOUNTER — Encounter: Payer: Self-pay | Admitting: *Deleted

## 2020-01-29 ENCOUNTER — Other Ambulatory Visit: Payer: Self-pay | Admitting: Internal Medicine

## 2020-01-29 DIAGNOSIS — I10 Essential (primary) hypertension: Secondary | ICD-10-CM

## 2020-02-01 ENCOUNTER — Ambulatory Visit (INDEPENDENT_AMBULATORY_CARE_PROVIDER_SITE_OTHER): Payer: Medicare Other | Admitting: Internal Medicine

## 2020-02-01 ENCOUNTER — Encounter: Payer: Self-pay | Admitting: Dietician

## 2020-02-01 ENCOUNTER — Telehealth: Payer: Self-pay | Admitting: *Deleted

## 2020-02-01 ENCOUNTER — Encounter: Payer: Self-pay | Admitting: Internal Medicine

## 2020-02-01 ENCOUNTER — Other Ambulatory Visit (HOSPITAL_COMMUNITY)
Admission: RE | Admit: 2020-02-01 | Discharge: 2020-02-01 | Disposition: A | Discharge status: Home / Self Care | Payer: Medicare Other | Source: Ambulatory Visit | Attending: Internal Medicine | Admitting: Internal Medicine

## 2020-02-01 ENCOUNTER — Ambulatory Visit: Payer: Medicare Other | Admitting: Dietician

## 2020-02-01 VITALS — BP 142/77 | HR 81 | Temp 81.0°F | Wt 178.3 lb

## 2020-02-01 DIAGNOSIS — N760 Acute vaginitis: Secondary | ICD-10-CM

## 2020-02-01 DIAGNOSIS — Z13820 Encounter for screening for osteoporosis: Secondary | ICD-10-CM | POA: Diagnosis not present

## 2020-02-01 DIAGNOSIS — E1142 Type 2 diabetes mellitus with diabetic polyneuropathy: Secondary | ICD-10-CM | POA: Diagnosis not present

## 2020-02-01 DIAGNOSIS — E118 Type 2 diabetes mellitus with unspecified complications: Secondary | ICD-10-CM

## 2020-02-01 DIAGNOSIS — J302 Other seasonal allergic rhinitis: Secondary | ICD-10-CM | POA: Diagnosis not present

## 2020-02-01 DIAGNOSIS — M810 Age-related osteoporosis without current pathological fracture: Secondary | ICD-10-CM

## 2020-02-01 LAB — POCT GLYCOSYLATED HEMOGLOBIN (HGB A1C): Hemoglobin A1C: 6.8 % — AB (ref 4.0–5.6)

## 2020-02-01 LAB — GLUCOSE, CAPILLARY: Glucose-Capillary: 166 mg/dL — ABNORMAL HIGH (ref 70–99)

## 2020-02-01 MED ORDER — OZEMPIC (0.25 OR 0.5 MG/DOSE) 2 MG/1.5ML ~~LOC~~ SOPN: 1.0000 mg | PEN_INJECTOR | SUBCUTANEOUS | 1 refills | Status: DC

## 2020-02-01 NOTE — Progress Notes (Deleted)
CC: ***  HPI:  Ms.Demani L Byrnes is a 64 y.o. female with PMH below.  Today we will address ***  Please see A&P for status of the patient's chronic medical conditions  Past Medical History:  Diagnosis Date  . Adenomatous colon polyp 6/09   Resected on colonoscopy, no high grade dysplasia.   . Allergic rhinitis   . Bell's palsy 01/2009   Left sided  . Chest pain    Exercise stress test negative 6/07  . Colon polyps    03/22/2008: adenomatous polyps x3 w no dysplasia. Needs repeat colonoscopy in 3 years  . Diabetic peripheral neuropathy ([place])   . External hemorrhoid   . GERD (gastroesophageal reflux disease)   . Hyperlipidemia   . Hypertension   . Hypertensive retinopathy    Followed by Dr. Herbert Deaner.  . Insomnia   . Intolerance, drug    Leg cramps on Lipitor  . Lipoma 12/11   Posterior neck.   . Osteoarthritis    Carpometacarpal joint of right thumb  . Postmenopausal bleeding 9/05   Endometrial biopsy showed  FOCAL TUBAL METAPLASIA   . Postmenopausal symptoms    Hot flashes, vaginal dryness, iritability, difficulty sleeping. as of 2005.  Marland Kitchen Sebaceous cyst of breast    Has been refered to derm.  . Trigger finger    Right thumb  . Type II diabetes mellitus ([place])   . Uterine fibroid    Review of Systems:  ***  Physical Exam:  Vitals:   02/01/20 1530  BP: (!) 155/81  Pulse: 93  SpO2: 100%  Weight: 178 lb 4.8 oz (80.9 kg)   ***  Social History   Socioeconomic History  . Marital status: Single    Spouse name: Not on file  . Number of children: Not on file  . Years of education: Not on file  . Highest education level: Not on file  Occupational History  . Not on file  Tobacco Use  . Smoking status: Current Every Day Smoker    Packs/day: 1.00    Years: 30.00    Pack years: 30.00    Types: Cigarettes  . Smokeless tobacco: Former Systems developer    Quit date: 11/28/2010  . Tobacco comment: 1 PPD  Substance and Sexual Activity  . Alcohol use: No    Alcohol/week: 0.0  standard drinks  . Drug use: No  . Sexual activity: Not on file  Other Topics Concern  . Not on file  Social History Narrative  . Not on file   Social Determinants of Health   Financial Resource Strain:   . Difficulty of Paying Living Expenses:   Food Insecurity: Food Insecurity Present  . Worried About Charity fundraiser in the Last Year: Sometimes true  . Ran Out of Food in the Last Year: Never true  Transportation Needs:   . Lack of Transportation (Medical):   Marland Kitchen Lack of Transportation (Non-Medical):   Physical Activity:   . Days of Exercise per Week:   . Minutes of Exercise per Session:   Stress:   . Feeling of Stress :   Social Connections:   . Frequency of Communication with Friends and Family:   . Frequency of Social Gatherings with Friends and Family:   . Attends Religious Services:   . Active Member of Clubs or Organizations:   . Attends Archivist Meetings:   Marland Kitchen Marital Status:   Intimate Partner Violence:   . Fear of Current or Ex-Partner:   .  Emotionally Abused:   Marland Kitchen Physically Abused:   . Sexually Abused:    *** Family History  Problem Relation Age of Onset  . Pneumonia Mother   . Diabetes Mother   . Early death Father   . Diabetes Sister   . Diabetes Brother   . Diabetes Maternal Grandmother     Assessment & Plan:   See Encounters Tab for problem based charting.  Patient {GC/GE:[id number]::"discussed with","seen with"} Dr. {NAMES:[id number]::"Butcher","Granfortuna","E. Hoffman","Klima","Mullen","Narendra","Raines","Vincent"}

## 2020-02-01 NOTE — Patient Instructions (Addendum)
[name] we have increased your ozempic to one milligram weekly.  Additionally we have stopped your glimeperide.  I will test your swab for a possible candidal infection and call you with the results.  I will refill your zyrtec temporarily.  I have ordered a bone density test.

## 2020-02-01 NOTE — Progress Notes (Signed)
CC: T2DM, Vulvovaginitis, seasonal allergies, osteoporosis screening   HPI:  [name] is a 64 y.o. female with PMH below.  Today we will address T2DM, Vulvovaginitis, seasonal allergies, osteoporosis screening      Please see A&P for status of the patient's chronic medical conditions  Past Medical History:  Diagnosis Date  . Adenomatous colon polyp 6/09   Resected on colonoscopy, no high grade dysplasia.   . Allergic rhinitis   . Bell's palsy 01/2009   Left sided  . Chest pain    Exercise stress test negative 6/07  . Colon polyps    03/22/2008: adenomatous polyps x3 w no dysplasia. Needs repeat colonoscopy in 3 years  . Diabetic peripheral neuropathy ([place])   . External hemorrhoid   . GERD (gastroesophageal reflux disease)   . Hyperlipidemia   . Hypertension   . Hypertensive retinopathy    Followed by Dr. Herbert Deaner.  . Insomnia   . Intolerance, drug    Leg cramps on Lipitor  . Lipoma 12/11   Posterior neck.   . Osteoarthritis    Carpometacarpal joint of right thumb  . Postmenopausal bleeding 9/05   Endometrial biopsy showed  FOCAL TUBAL METAPLASIA   . Postmenopausal symptoms    Hot flashes, vaginal dryness, iritability, difficulty sleeping. as of 2005.  Marland Kitchen Sebaceous cyst of breast    Has been refered to derm.  . Trigger finger    Right thumb  . Type II diabetes mellitus ([place])   . Uterine fibroid    Review of Systems:  ROS: Pulmonary: pt denies increased work of breathing, shortness of breath,  Cardiac: pt denies palpitations, chest pain,  Abdominal: pt denies abdominal pain, nausea, vomiting, or diarrhea   Physical Exam:  Vitals:   02/01/20 1530  BP: (!) 155/81  Pulse: 93  SpO2: 100%  Weight: 178 lb 4.8 oz (80.9 kg)   Cardiac: JVD flat, normal rate and rhythm, clear s1 and s2, no murmurs, rubs or gallops, no LE edema Pulmonary: CTAB, not in distress Vaginal exam: white exudate and erythema within vaginal folds Psych: Alert, conversant, in good  spirits   Social History   Socioeconomic History  . Marital status: Single    Spouse name: Not on file  . Number of children: Not on file  . Years of education: Not on file  . Highest education level: Not on file  Occupational History  . Not on file  Tobacco Use  . Smoking status: Current Every Day Smoker    Packs/day: 1.00    Years: 30.00    Pack years: 30.00    Types: Cigarettes  . Smokeless tobacco: Former Systems developer    Quit date: 11/28/2010  . Tobacco comment: 1 PPD  Substance and Sexual Activity  . Alcohol use: No    Alcohol/week: 0.0 standard drinks  . Drug use: No  . Sexual activity: Not on file  Other Topics Concern  . Not on file  Social History Narrative  . Not on file   Social Determinants of Health   Financial Resource Strain:   . Difficulty of Paying Living Expenses:   Food Insecurity: Food Insecurity Present  . Worried About Charity fundraiser in the Last Year: Sometimes true  . Ran Out of Food in the Last Year: Never true  Transportation Needs:   . Lack of Transportation (Medical):   Marland Kitchen Lack of Transportation (Non-Medical):   Physical Activity:   . Days of Exercise per Week:   . Minutes of  Exercise per Session:   Stress:   . Feeling of Stress :   Social Connections:   . Frequency of Communication with Friends and Family:   . Frequency of Social Gatherings with Friends and Family:   . Attends Religious Services:   . Active Member of Clubs or Organizations:   . Attends Archivist Meetings:   Marland Kitchen Marital Status:   Intimate Partner Violence:   . Fear of Current or Ex-Partner:   . Emotionally Abused:   Marland Kitchen Physically Abused:   . Sexually Abused:     Family History  Problem Relation Age of Onset  . Pneumonia Mother   . Diabetes Mother   . Early death Father   . Diabetes Sister   . Diabetes Brother   . Diabetes Maternal Grandmother     Assessment & Plan:   See Encounters Tab for problem based charting.  Patient discussed with Dr.  Philipp Ovens

## 2020-02-01 NOTE — Progress Notes (Signed)
The patient was not seen by diabetes educator today. She will be asked to reschedule.

## 2020-02-01 NOTE — Telephone Encounter (Addendum)
Information was sent to CoverMyMeds for [place] for Ozempic.  Awaiting determination witjin 3-7 days.  [phone].  Sander Nephew, RN 02/01/2019 4:15 PM. Denial from [employer] will cover if patient is given 1 months worth.  Call to pharmacy.  Patient's dispense was changed to 1 month's worth 2 pens.  Was covered.  Sander Nephew, RN 02/03/2020 9:55 AM.

## 2020-02-02 DIAGNOSIS — N760 Acute vaginitis: Secondary | ICD-10-CM | POA: Insufficient documentation

## 2020-02-02 DIAGNOSIS — Z13820 Encounter for screening for osteoporosis: Secondary | ICD-10-CM | POA: Insufficient documentation

## 2020-02-02 HISTORY — DX: Acute vaginitis: N76.0

## 2020-02-02 HISTORY — DX: Encounter for screening for osteoporosis: Z13.820

## 2020-02-02 LAB — CERVICOVAGINAL ANCILLARY ONLY
Bacterial Vaginitis (gardnerella): NEGATIVE
Candida Glabrata: NEGATIVE
Candida Vaginitis: NEGATIVE
Comment: NEGATIVE
Comment: NEGATIVE
Comment: NEGATIVE

## 2020-02-02 MED ORDER — CETIRIZINE HCL 5 MG PO TABS: 5.0000 mg | ORAL_TABLET | Freq: Every day | ORAL | 0 refills | Status: DC

## 2020-02-02 NOTE — Progress Notes (Signed)
Internal Medicine Clinic Attending  Case discussed with Dr. Winfrey  at the time of the visit.  We reviewed the resident's history and exam and pertinent patient test results.  I agree with the assessment, diagnosis, and plan of care documented in the resident's note.  

## 2020-02-02 NOTE — Assessment & Plan Note (Signed)
Pt requires refills on medications with associated diagnosis above.  Reviewed disease process and find this medication to be necessary, will not change dose or alter current therapy. 

## 2020-02-02 NOTE — Assessment & Plan Note (Addendum)
She felt like she has had burning since going to a restaurant 3 weeks ago, water splashed up into her vagina during urination.  Burning only on the outside.  No dysuria, or increased frequency.  No sexual partners last sexual partner in 2002.  No history of STI, no prior history of genital herpes. She has white discharge present on vaginal exam, likely hand candidal infection.    -check for BV and Candidal infection -treat if appropriate

## 2020-02-02 NOTE — Assessment & Plan Note (Signed)
Patient has been doing well with her diabetes.  A1C 6.8 today.  She has periods where she will stop taking her medications and have poorly controlled diabetes.  There was a question of MODY however she has had good control in the past without a sulfonyurea, just has to take her medications.  Additionally she has had some lows at home of around 60 symptomatic.  Not a good candidate for SGLT2i  -d/c glimeperide -increase ozempic to 1mg  weekly -continue working with donna -continue metformin full dose

## 2020-02-02 NOTE — Assessment & Plan Note (Addendum)
Pt is due for age related screening for osteoporosis.  No fractures  -bone density scan

## 2020-02-08 ENCOUNTER — Other Ambulatory Visit: Payer: Self-pay

## 2020-02-08 ENCOUNTER — Encounter: Payer: Self-pay | Admitting: Podiatry

## 2020-02-08 ENCOUNTER — Ambulatory Visit (INDEPENDENT_AMBULATORY_CARE_PROVIDER_SITE_OTHER): Payer: Medicare Other | Admitting: Podiatry

## 2020-02-08 VITALS — Temp 96.3°F

## 2020-02-08 DIAGNOSIS — E1142 Type 2 diabetes mellitus with diabetic polyneuropathy: Secondary | ICD-10-CM

## 2020-02-08 DIAGNOSIS — Q828 Other specified congenital malformations of skin: Secondary | ICD-10-CM

## 2020-02-08 NOTE — Progress Notes (Signed)
Subjective: Bridget Owens presents today for follow up of preventative diabetic foot care and painful porokeratotic lesion(s) b/l feet and painful mycotic toenails b/l that limit ambulation. Aggravating factors include weightbearing with and without shoe gear. Pain for both is relieved with periodic professional debridement..  She relates her left foot is painful.   Allergies  Allergen Reactions  . Codeine Other (See Comments)    "felt funny"     Objective: Vitals:   02/08/20 1518  Temp: (!) 96.3 F (35.7 C)    Pt is a pleasant 64 y.o. year old Caucasian female  WD, WN  in NAD. AAO x 3.   Vascular Examination:  Capillary fill time to digits <3 seconds b/l. Faintly palpable pedal pulses b/l. Pedal hair present b/l. Skin temperature gradient within normal limits b/l.  Dermatological Examination: Pedal skin with normal turgor, texture and tone bilaterally. No open wounds bilaterally. No interdigital macerations bilaterally. Porokeratotic lesion(s) submet head 2 right foot, submet head 3 right foot, submet head 5 left foot and submet head 5 right foot. No erythema, no edema, no drainage, no flocculence.  Toenails 1-5 b/l well manicured with teal blue nailpolish. No erythema, no edema, no draiang  Musculoskeletal: Normal muscle strength 5/5 to all lower extremity muscle groups bilaterally. No pain crepitus or joint limitation noted with ROM b/l. Tailor's bunion deformity noted b/l.  Neurological: Protective sensation decreased with 10 gram monofilament b/l. Vibratory sensation intact b/l.  Assessment: 1. Porokeratosis   2. Diabetic peripheral neuropathy associated with type 2 diabetes mellitus ([place])     Plan: -No new findings. No new orders. -Continue diabetic foot care principles. Literature dispensed on today.  -Painful porokeratotic lesion(s) submet head 2 right foot, submet head 3 right foot, submet head 5 left foot and submet head 5 right foot pared and enucleated with  sterile scalpel blade without incident. -Patient to continue soft, supportive shoe gear daily. -Patient to report any pedal injuries to medical professional immediately. -Patient/POA to call should there be question/concern in the interim.  Return in about 3 months (around 05/10/2020) for diabetic nail and callus trim.  Marzetta Board, DPM

## 2020-02-08 NOTE — Patient Instructions (Signed)
Diabetes Mellitus and Foot Care Foot care is an important part of your health, especially when you have diabetes. Diabetes may cause you to have problems because of poor blood flow (circulation) to your feet and legs, which can cause your skin to:  Become thinner and drier.  Break more easily.  Heal more slowly.  Peel and crack. You may also have nerve damage (neuropathy) in your legs and feet, causing decreased feeling in them. This means that you may not notice minor injuries to your feet that could lead to more serious problems. Noticing and addressing any potential problems early is the best way to prevent future foot problems. How to care for your feet Foot hygiene  Wash your feet daily with warm water and mild soap. Do not use hot water. Then, pat your feet and the areas between your toes until they are completely dry. Do not soak your feet as this can dry your skin.  Trim your toenails straight across. Do not dig under them or around the cuticle. File the edges of your nails with an emery board or nail file.  Apply a moisturizing lotion or petroleum jelly to the skin on your feet and to dry, brittle toenails. Use lotion that does not contain alcohol and is unscented. Do not apply lotion between your toes. Shoes and socks  Wear clean socks or stockings every day. Make sure they are not too tight. Do not wear knee-high stockings since they may decrease blood flow to your legs.  Wear shoes that fit properly and have enough cushioning. Always look in your shoes before you put them on to be sure there are no objects inside.  To break in new shoes, wear them for just a few hours a day. This prevents injuries on your feet. Wounds, scrapes, corns, and calluses  Check your feet daily for blisters, cuts, bruises, sores, and redness. If you cannot see the bottom of your feet, use a mirror or ask someone for help.  Do not cut corns or calluses or try to remove them with medicine.  If you  find a minor scrape, cut, or break in the skin on your feet, keep it and the skin around it clean and dry. You may clean these areas with mild soap and water. Do not clean the area with peroxide, alcohol, or iodine.  If you have a wound, scrape, corn, or callus on your foot, look at it several times a day to make sure it is healing and not infected. Check for: ? Redness, swelling, or pain. ? Fluid or blood. ? Warmth. ? Pus or a bad smell. General instructions  Do not cross your legs. This may decrease blood flow to your feet.  Do not use heating pads or hot water bottles on your feet. They may burn your skin. If you have lost feeling in your feet or legs, you may not know this is happening until it is too late.  Protect your feet from hot and cold by wearing shoes, such as at the beach or on hot pavement.  Schedule a complete foot exam at least once a year (annually) or more often if you have foot problems. If you have foot problems, report any cuts, sores, or bruises to your health care provider immediately. Contact a health care provider if:  You have a medical condition that increases your risk of infection and you have any cuts, sores, or bruises on your feet.  You have an injury that is not   healing.  You have redness on your legs or feet.  You feel burning or tingling in your legs or feet.  You have pain or cramps in your legs and feet.  Your legs or feet are numb.  Your feet always feel cold.  You have pain around a toenail. Get help right away if:  You have a wound, scrape, corn, or callus on your foot and: ? You have pain, swelling, or redness that gets worse. ? You have fluid or blood coming from the wound, scrape, corn, or callus. ? Your wound, scrape, corn, or callus feels warm to the touch. ? You have pus or a bad smell coming from the wound, scrape, corn, or callus. ? You have a fever. ? You have a red line going up your leg. Summary  Check your feet every day  for cuts, sores, red spots, swelling, and blisters.  Moisturize feet and legs daily.  Wear shoes that fit properly and have enough cushioning.  If you have foot problems, report any cuts, sores, or bruises to your health care provider immediately.  Schedule a complete foot exam at least once a year (annually) or more often if you have foot problems. This information is not intended to replace advice given to you by your health care provider. Make sure you discuss any questions you have with your health care provider. Document Revised: 06/17/2019 Document Reviewed: 10/26/2016 [employer] Patient Education  2020 [employer]  Peripheral Neuropathy Peripheral neuropathy is a type of nerve damage. It affects nerves that carry signals between the spinal cord and the arms, legs, and the rest of the body (peripheral nerves). It does not affect nerves in the spinal cord or brain. In peripheral neuropathy, one nerve or a group of nerves may be damaged. Peripheral neuropathy is a broad category that includes many specific nerve disorders, like diabetic neuropathy, hereditary neuropathy, and carpal tunnel syndrome. What are the causes? This condition may be caused by:  Diabetes. This is the most common cause of peripheral neuropathy.  Nerve injury.  Pressure or stress on a nerve that lasts a long time.  Lack (deficiency) of B vitamins. This can result from alcoholism, poor diet, or a restricted diet.  Infections.  Autoimmune diseases, such as rheumatoid arthritis and systemic lupus erythematosus.  Nerve diseases that are passed from parent to child (inherited).  Some medicines, such as cancer medicines (chemotherapy).  Poisonous (toxic) substances, such as lead and mercury.  Too little blood flowing to the legs.  Kidney disease.  Thyroid disease. In some cases, the cause of this condition is not known. What are the signs or symptoms? Symptoms of this condition depend on which of your  nerves is damaged. Common symptoms include:  Loss of feeling (numbness) in the feet, hands, or both.  Tingling in the feet, hands, or both.  Burning pain.  Very sensitive skin.  Weakness.  Not being able to move a part of the body (paralysis).  Muscle twitching.  Clumsiness or poor coordination.  Loss of balance.  Not being able to control your bladder.  Feeling dizzy.  Sexual problems. How is this diagnosed? Diagnosing and finding the cause of peripheral neuropathy can be difficult. Your health care provider will take your medical history and do a physical exam. A neurological exam will also be done. This involves checking things that are affected by your brain, spinal cord, and nerves (nervous system). For example, your health care provider will check your reflexes, how you move, and   what you can feel. You may have other tests, such as:  Blood tests.  Electromyogram (EMG) and nerve conduction tests. These tests check nerve function and how well the nerves are controlling the muscles.  Imaging tests, such as CT scans or MRI to rule out other causes of your symptoms.  Removing a small piece of nerve to be examined in a lab (nerve biopsy). This is rare.  Removing and examining a small amount of the fluid that surrounds the brain and spinal cord (lumbar puncture). This is rare. How is this treated? Treatment for this condition may involve:  Treating the underlying cause of the neuropathy, such as diabetes, kidney disease, or vitamin deficiencies.  Stopping medicines that can cause neuropathy, such as chemotherapy.  Medicine to relieve pain. Medicines may include: ? Prescription or over-the-counter pain medicine. ? Antiseizure medicine. ? Antidepressants. ? Pain-relieving patches that are applied to painful areas of skin.  Surgery to relieve pressure on a nerve or to destroy a nerve that is causing pain.  Physical therapy to help improve movement and  balance.  Devices to help you move around (assistive devices). Follow these instructions at home: Medicines  Take over-the-counter and prescription medicines only as told by your health care provider. Do not take any other medicines without first asking your health care provider.  Do not drive or use heavy machinery while taking prescription pain medicine. Lifestyle   Do not use any products that contain nicotine or tobacco, such as cigarettes and e-cigarettes. Smoking keeps blood from reaching damaged nerves. If you need help quitting, ask your health care provider.  Avoid or limit alcohol. Too much alcohol can cause a vitamin B deficiency, and vitamin B is needed for healthy nerves.  Eat a healthy diet. This includes: ? Eating foods that are high in fiber, such as fresh fruits and vegetables, whole grains, and beans. ? Limiting foods that are high in fat and processed sugars, such as fried or sweet foods. General instructions   If you have diabetes, work closely with your health care provider to keep your blood sugar under control.  If you have numbness in your feet: ? Check every day for signs of injury or infection. Watch for redness, warmth, and swelling. ? Wear padded socks and comfortable shoes. These help protect your feet.  Develop a good support system. Living with peripheral neuropathy can be stressful. Consider talking with a mental health specialist or joining a support group.  Use assistive devices and attend physical therapy as told by your health care provider. This may include using a walker or a cane.  Keep all follow-up visits as told by your health care provider. This is important. Contact a health care provider if:  You have new signs or symptoms of peripheral neuropathy.  You are struggling emotionally from dealing with peripheral neuropathy.  Your pain is not well-controlled. Get help right away if:  You have an injury or infection that is not healing  normally.  You develop new weakness in an arm or leg.  You fall frequently. Summary  Peripheral neuropathy is when the nerves in the arms, or legs are damaged, resulting in numbness, weakness, or pain.  There are many causes of peripheral neuropathy, including diabetes, pinched nerves, vitamin deficiencies, autoimmune disease, and hereditary conditions.  Diagnosing and finding the cause of peripheral neuropathy can be difficult. Your health care provider will take your medical history, do a physical exam, and do tests, including blood tests and nerve function tests.    Treatment involves treating the underlying cause of the neuropathy and taking medicines to help control pain. Physical therapy and assistive devices may also help. This information is not intended to replace advice given to you by your health care provider. Make sure you discuss any questions you have with your health care provider. Document Revised: 09/06/2017 Document Reviewed: 12/03/2016 [employer] Patient Education  2020 [employer]  Corns and Calluses Corns are small areas of thickened skin that occur on the top, sides, or tip of a toe. They contain a cone-shaped core with a point that can press on a nerve below. This causes pain.  Calluses are areas of thickened skin that can occur anywhere on the body, including the hands, fingers, palms, soles of the feet, and heels. Calluses are usually larger than corns. What are the causes? Corns and calluses are caused by rubbing (friction) or pressure, such as from shoes that are too tight or do not fit properly. What increases the risk? Corns are more likely to develop in people who have misshapen toes (toe deformities), such as hammer toes. Calluses can occur with friction to any area of the skin. They are more likely to develop in people who:  Work with their hands.  Wear shoes that fit poorly, are too tight, or are high-heeled.  Have toe deformities. What are the signs  or symptoms? Symptoms of a corn or callus include:  A hard growth on the skin.  Pain or tenderness under the skin.  Redness and swelling.  Increased discomfort while wearing tight-fitting shoes, if your feet are affected. If a corn or callus becomes infected, symptoms may include:  Redness and swelling that gets worse.  Pain.  Fluid, blood, or pus draining from the corn or callus. How is this diagnosed? Corns and calluses may be diagnosed based on your symptoms, your medical history, and a physical exam. How is this treated? Treatment for corns and calluses may include:  Removing the cause of the friction or pressure. This may involve: ? Changing your shoes. ? Wearing shoe inserts (orthotics) or other protective layers in your shoes, such as a corn pad. ? Wearing gloves.  Applying medicine to the skin (topical medicine) to help soften skin in the hardened, thickened areas.  Removing layers of dead skin with a file to reduce the size of the corn or callus.  Removing the corn or callus with a scalpel or laser.  Taking antibiotic medicines, if your corn or callus is infected.  Having surgery, if a toe deformity is the cause. Follow these instructions at home:   Take over-the-counter and prescription medicines only as told by your health care provider.  If you were prescribed an antibiotic, take it as told by your health care provider. Do not stop taking it even if your condition starts to improve.  Wear shoes that fit well. Avoid wearing high-heeled shoes and shoes that are too tight or too loose.  Wear any padding, protective layers, gloves, or orthotics as told by your health care provider.  Soak your hands or feet and then use a file or pumice stone to soften your corn or callus. Do this as told by your health care provider.  Check your corn or callus every day for symptoms of infection. Contact a health care provider if you:  Notice that your symptoms do not  improve with treatment.  Have redness or swelling that gets worse.  Notice that your corn or callus becomes painful.  Have fluid, blood, or   pus coming from your corn or callus.  Have new symptoms. Summary  Corns are small areas of thickened skin that occur on the top, sides, or tip of a toe.  Calluses are areas of thickened skin that can occur anywhere on the body, including the hands, fingers, palms, and soles of the feet. Calluses are usually larger than corns.  Corns and calluses are caused by rubbing (friction) or pressure, such as from shoes that are too tight or do not fit properly.  Treatment may include wearing any padding, protective layers, gloves, or orthotics as told by your health care provider. This information is not intended to replace advice given to you by your health care provider. Make sure you discuss any questions you have with your health care provider. Document Revised: 01/14/2019 Document Reviewed: 08/07/2017 [employer] Patient Education  2020 [employer]  

## 2020-02-22 ENCOUNTER — Ambulatory Visit: Payer: Medicare Other | Admitting: Podiatry

## 2020-02-26 ENCOUNTER — Ambulatory Visit: Payer: Medicare Other | Admitting: Podiatry

## 2020-03-20 ENCOUNTER — Emergency Department (HOSPITAL_COMMUNITY): Payer: Medicare Other

## 2020-03-20 ENCOUNTER — Encounter (HOSPITAL_COMMUNITY): Payer: Self-pay | Admitting: Emergency Medicine

## 2020-03-20 ENCOUNTER — Other Ambulatory Visit: Payer: Self-pay

## 2020-03-20 ENCOUNTER — Emergency Department (HOSPITAL_COMMUNITY)
Admission: EM | Admit: 2020-03-20 | Discharge: 2020-03-20 | Disposition: A | Discharge status: Home / Self Care | Payer: Medicare Other | Attending: Emergency Medicine | Admitting: Emergency Medicine

## 2020-03-20 DIAGNOSIS — W19XXXA Unspecified fall, initial encounter: Secondary | ICD-10-CM | POA: Insufficient documentation

## 2020-03-20 DIAGNOSIS — Z79899 Other long term (current) drug therapy: Secondary | ICD-10-CM | POA: Insufficient documentation

## 2020-03-20 DIAGNOSIS — Y939 Activity, unspecified: Secondary | ICD-10-CM | POA: Diagnosis not present

## 2020-03-20 DIAGNOSIS — S39012A Strain of muscle, fascia and tendon of lower back, initial encounter: Secondary | ICD-10-CM | POA: Diagnosis not present

## 2020-03-20 DIAGNOSIS — Y999 Unspecified external cause status: Secondary | ICD-10-CM | POA: Insufficient documentation

## 2020-03-20 DIAGNOSIS — I10 Essential (primary) hypertension: Secondary | ICD-10-CM | POA: Insufficient documentation

## 2020-03-20 DIAGNOSIS — Z7982 Long term (current) use of aspirin: Secondary | ICD-10-CM | POA: Insufficient documentation

## 2020-03-20 DIAGNOSIS — F17210 Nicotine dependence, cigarettes, uncomplicated: Secondary | ICD-10-CM | POA: Diagnosis not present

## 2020-03-20 DIAGNOSIS — Z7984 Long term (current) use of oral hypoglycemic drugs: Secondary | ICD-10-CM | POA: Diagnosis not present

## 2020-03-20 DIAGNOSIS — E119 Type 2 diabetes mellitus without complications: Secondary | ICD-10-CM | POA: Diagnosis not present

## 2020-03-20 DIAGNOSIS — Y929 Unspecified place or not applicable: Secondary | ICD-10-CM | POA: Diagnosis not present

## 2020-03-20 DIAGNOSIS — S93401A Sprain of unspecified ligament of right ankle, initial encounter: Secondary | ICD-10-CM

## 2020-03-20 DIAGNOSIS — S3992XA Unspecified injury of lower back, initial encounter: Secondary | ICD-10-CM | POA: Diagnosis present

## 2020-03-20 MED ORDER — LIDOCAINE 5 % EX PTCH
1.0000 | MEDICATED_PATCH | CUTANEOUS | Status: DC
  Administered 2020-03-20: 1 via TRANSDERMAL
  Filled 2020-03-20: qty 1

## 2020-03-20 NOTE — ED Provider Notes (Signed)
[facility] Provider Note   CSN: [id number] Arrival date & time: 03/20/20  0014     History Chief Complaint  Patient presents with  . Fall    Bridget Owens is a 64 y.o. female.  64yo female slipped and fell yesterday. Right ankle pain has resolved, complains of low back pain. Ambulatory without assistance. No LOC, didn't hit head, not anticoagulated.         Past Medical History:  Diagnosis Date  . Adenomatous colon polyp 6/09   Resected on colonoscopy, no high grade dysplasia.   . Allergic rhinitis   . Bell's palsy 01/2009   Left sided  . Chest pain    Exercise stress test negative 6/07  . Colon polyps    03/22/2008: adenomatous polyps x3 w no dysplasia. Needs repeat colonoscopy in 3 years  . Diabetic peripheral neuropathy ([place])   . External hemorrhoid   . GERD (gastroesophageal reflux disease)   . Hyperlipidemia   . Hypertension   . Hypertensive retinopathy    Followed by Dr. Herbert Deaner.  . Insomnia   . Intolerance, drug    Leg cramps on Lipitor  . Lipoma 12/11   Posterior neck.   . Osteoarthritis    Carpometacarpal joint of right thumb  . Postmenopausal bleeding 9/05   Endometrial biopsy showed  FOCAL TUBAL METAPLASIA   . Postmenopausal symptoms    Hot flashes, vaginal dryness, iritability, difficulty sleeping. as of 2005.  Marland Kitchen Sebaceous cyst of breast    Has been refered to derm.  . Trigger finger    Right thumb  . Type II diabetes mellitus ([place])   . Uterine fibroid     Patient Active Problem List   Diagnosis Date Noted  . Vulvovaginitis 02/02/2020  . Osteoporosis screening 02/02/2020  . Hand pain, left 10/27/2019  . Non-scarring hair loss 07/27/2019  . Chronic pruritus 12/31/2018  . Cramping of hands 06/02/2018  . Numbness of left thumb 05/14/2018  . Vaginal burning 05/14/2018  . At risk for polypharmacy 02/27/2018  . Viral URI with cough 10/11/2017  . Hypertrophic toenail 09/05/2017  . Pulmonary fibrosis,  unspecified ([place]) 09/05/2016  . Left flank pain 08/24/2016  . Burning chest pain 05/22/2016  . Pruritus 01/09/2016  . Mixed incontinence urge and stress 12/21/2014  . Neuropathy, lower extremity 12/21/2014  . Seasonal allergies 10/06/2014  . Ear pain, bilateral 05/30/2014  . Cigarette smoker 04/03/2013  . Health care maintenance 02/03/2013  . 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 Hemorrhoids 02/07/2012  . GERD (gastroesophageal reflux disease) 08/13/2011  . Type 2 diabetes mellitus with peripheral neuropathy ([place]) 11/11/2006  . Hyperlipidemia 11/11/2006  . Hypertensive retinopathy 11/11/2006  . Essential hypertension 11/11/2006  . Hot flashes due to menopause 11/11/2006    Past Surgical History:  Procedure Laterality Date  . TUBAL LIGATION       OB History   No obstetric history on file.     Family History  Problem Relation Age of Onset  . Pneumonia Mother   . Diabetes Mother   . Early death Father   . Diabetes Sister   . Diabetes Brother   . Diabetes Maternal Grandmother     Social History   Tobacco Use  . Smoking status: Current Every Day Smoker    Packs/day: 1.00    Years: 30.00    Pack years: 30.00    Types: Cigarettes  . Smokeless tobacco: Former Systems developer    Quit date: 11/28/2010  .  Tobacco comment: 1 PPD  Substance Use Topics  . Alcohol use: No    Alcohol/week: 0.0 standard drinks  . Drug use: No    Home Medications Prior to Admission medications   Medication Sig Start Date End Date Taking? Authorizing Provider  Accu-Chek FastClix Lancets MISC Check up to two times a day. 11/05/19   Katherine Roan, MD  albuterol (PROVENTIL HFA) 108 (90 Base) MCG/ACT inhaler Inhale 1-2 puffs into the lungs every 6 (six) hours as needed for wheezing or shortness of breath. 10/11/17   Kalman Shan Ratliff, DO  amLODipine (NORVASC) 10 MG tablet TAKE 1 TABLET BY MOUTH EVERY EVENING 07/15/19   Katherine Roan, MD  aspirin 81 MG EC tablet Take 81 mg by mouth  daily.      [provider]  atorvastatin (LIPITOR) 40 MG tablet TAKE 1 TABLET(40 MG) BY MOUTH DAILY 12/25/18   Katherine Roan, MD  Blood Glucose Monitoring Suppl (ACCU-CHEK GUIDE) w/Device KIT USE AS DIRECTED 10/28/18   Katherine Roan, MD  cetirizine (ZYRTEC) 5 MG tablet Take 1 tablet (5 mg total) by mouth daily. 02/02/20   Katherine Roan, MD  ciclopirox (PENLAC) 8 % solution Apply topically at bedtime. Apply over nail and surrounding skin. Apply daily over previous coat. After seven (7) days, may remove with alcohol and continue cycle. 09/23/17   Trula Slade, DPM  fluticasone (FLONASE) 50 MCG/ACT nasal spray Place 1 spray into both nostrils daily. 06/29/19   Katherine Roan, MD  gabapentin (NEURONTIN) 300 MG capsule Take 1 capsule (300 mg total) by mouth at bedtime. 07/14/18   [place], Carley D, DO  glucose blood (ACCU-CHEK GUIDE) test strip Check blood sugar up to 2 times per day 11/05/19   Katherine Roan, MD  guaiFENesin-dextromethorphan (ROBITUSSIN DM) 100-10 MG/5ML syrup Take 5 mLs by mouth every 4 (four) hours as needed for cough. 06/29/19   Katherine Roan, MD  hydrochlorothiazide (HYDRODIURIL) 25 MG tablet TAKE 1 TABLET BY MOUTH DAILY 01/29/20   Katherine Roan, MD  ibuprofen (ADVIL,MOTRIN) 600 MG tablet Can use one tablet 2-3 times per week with food for hip pain. 10/10/18   Alphonzo Grieve, MD  Insulin Pen Needle (PEN NEEDLES) 31G X 5 MM MISC Inject 1 applicator into the skin once a week. diag code E11.42. insulin dependent 06/29/19   Katherine Roan, MD  metFORMIN (GLUCOPHAGE) 1000 MG tablet TAKE 1 TABLET(1000 MG) BY MOUTH TWICE DAILY WITH A MEAL 10/13/19   Katherine Roan, MD  nicotine (NICODERM CQ - DOSED IN MG/24 HOURS) 14 mg/24hr patch Apply 1 patch daily. Wear for 24 hours, then remove and replace with new patch. If you have sleep disturbances, remove at bedtime. 03/17/18   Katherine Roan, MD  nicotine polacrilex (NICORETTE) 2 MG gum RX #2 Weeks  7-9: 1 piece every 2-4 hours.Max 24 pieces per day. 03/17/18   Katherine Roan, MD  pantoprazole (PROTONIX) 40 MG tablet TAKE ONE TABLET BY MOUTH EVERY DAY 12/03/17   Katherine Roan, MD  Semaglutide,0.25 or 0.5MG/DOS, (OZEMPIC, 0.25 OR 0.5 MG/DOSE,) 2 MG/1.5ML SOPN Inject 1 mg into the skin once a week. 02/01/20   Katherine Roan, MD  Vaginal Moisturizer (VAGISIL FEMININE MOISTURIZER) LOTN Place 1 application vaginally 3 (three) times a week. 10/13/18   Alphonzo Grieve, MD    Allergies    Codeine  Review of Systems   Review of Systems  Constitutional: Negative for fever.  Musculoskeletal: Positive for arthralgias  and back pain. Negative for gait problem, joint swelling, neck pain and neck stiffness.  Skin: Negative for rash and wound.  Neurological: Negative for weakness and numbness.  Psychiatric/Behavioral: Negative for confusion.    Physical Exam Updated Vital Signs BP 138/72 (BP Location: Right Arm)   Pulse 70   Temp 98.5 F (36.9 C) (Oral)   Resp 12   Ht _0  (1.626 m)   Wt 85 kg   SpO2 99%   BMI 32.17 kg/m   Physical Exam Vitals and nursing note reviewed.  Constitutional:      General: She is not in acute distress.    Appearance: She is well-developed. She is not diaphoretic.  HENT:     Head: Normocephalic and atraumatic.  Pulmonary:     Effort: Pulmonary effort is normal.  Abdominal:     Palpations: Abdomen is soft.     Tenderness: There is no abdominal tenderness.  Musculoskeletal:        General: Tenderness present. No swelling.     Thoracic back: No tenderness or bony tenderness.     Lumbar back: Tenderness present. No bony tenderness. Negative right straight leg raise test and negative left straight leg raise test.     Right knee: No bony tenderness. Normal range of motion. No tenderness.     Left knee: No bony tenderness. Normal range of motion. No tenderness.     Right lower leg: No edema.     Left lower leg: No edema.     Right ankle: Normal. No  swelling, deformity or ecchymosis. No tenderness. Normal range of motion.     Left ankle: Normal. No swelling, deformity or ecchymosis. No tenderness. Normal range of motion.     Right foot: Normal range of motion. No tenderness or bony tenderness. Normal pulse.     Left foot: Normal range of motion. No tenderness or bony tenderness. Normal pulse.     Comments: Vague low back pain, no midline or bony tenderness. Leg strength equal, gait normal.  Skin:    General: Skin is warm and dry.     Findings: No erythema or rash.  Neurological:     Mental Status: She is alert and oriented to person, place, and time.  Psychiatric:        Behavior: Behavior normal.     ED Results / Procedures / Treatments   Labs (all labs ordered are listed, but only abnormal results are displayed) Labs Reviewed - No data to display  EKG None  Radiology DG Lumbar Spine Complete  Result Date: 03/20/2020 CLINICAL DATA:  Pain EXAM: LUMBAR SPINE - COMPLETE 4+ VIEW COMPARISON:  None. FINDINGS: There is no evidence of lumbar spine fracture. Alignment is normal. Intervertebral disc spaces are maintained. IMPRESSION: Negative. Electronically Signed   By: Constance Holster M.D.   On: 03/20/2020 01:16   DG Ankle Complete Right  Result Date: 03/20/2020 CLINICAL DATA:  Pain status post fall EXAM: RIGHT ANKLE - COMPLETE 3+ VIEW COMPARISON:  April 07, 2008 FINDINGS: There is a small well corticated osseous fragment at the medial malleolus. This is favored to represent sequela of an old remote injury. There is no soft tissue swelling. There is a small plantar calcaneal spur. IMPRESSION: Negative. Electronically Signed   By: Constance Holster M.D.   On: 03/20/2020 01:16    Procedures Procedures (including critical care time)  Medications Ordered in ED Medications  lidocaine (LIDODERM) 5 % 1 patch (has no administration in time range)  ED Course  I have reviewed the triage vital signs and the nursing  notes.  Pertinent labs & imaging results that were available during my care of the patient were reviewed by me and considered in my medical decision making (see chart for details).  Clinical Course as of Mar 20 704  Sun Mar 20, 2020  0703 64yo female with low back pain after a fall. XR lumbar spine and ankle unremarkable. Plan is to place Lidoderm to low back, recommend motrin/tylenol/warm compresses and follow up with PCP.   [LM]    Clinical Course User Index [LM] Roque Lias   MDM Rules/Calculators/A&P                          Final Clinical Impression(s) / ED Diagnoses Final diagnoses:  Strain of lumbar region, initial encounter  Sprain of right ankle, unspecified ligament, initial encounter    Rx / DC Orders ED Discharge Orders    None       Tacy Learn, PA-C 03/20/20 0240    Veryl Speak, MD 03/22/20 [phone]

## 2020-03-20 NOTE — Discharge Instructions (Signed)
Warm compresses to your low back for 20 minutes at a time. Limit lifting to 10 pounds for the next few days. You can take Motrin Tylenol as needed as directed for your pain. Follow-up with your primary care provider.

## 2020-03-20 NOTE — ED Triage Notes (Addendum)
Patient slipped and fell on wet floor at [facility] this evening , denies LOC /ambulatory , reports pain at lower back and right ankle , alert and oriented , respirations unlabored.

## 2020-03-22 ENCOUNTER — Encounter: Payer: Self-pay | Admitting: Internal Medicine

## 2020-03-22 ENCOUNTER — Ambulatory Visit (INDEPENDENT_AMBULATORY_CARE_PROVIDER_SITE_OTHER): Payer: Medicare Other | Admitting: Internal Medicine

## 2020-03-22 DIAGNOSIS — M549 Dorsalgia, unspecified: Secondary | ICD-10-CM

## 2020-03-22 DIAGNOSIS — M545 Low back pain, unspecified: Secondary | ICD-10-CM

## 2020-03-22 HISTORY — DX: Dorsalgia, unspecified: M54.9

## 2020-03-22 MED ORDER — BACLOFEN 10 MG PO TABS: 10.0000 mg | ORAL_TABLET | Freq: Three times a day (TID) | ORAL | 0 refills | Status: DC | PRN

## 2020-03-22 NOTE — Assessment & Plan Note (Signed)
Patient reports that 2 days ago she was at [facility] shopping, she slipped on some water and fell backwards, thinks that she may have fell onto her back and leg, she went to the ER and was evaluated.  She had a lumbar x-ray and right ankle x-ray that were both negative for fractures or acute changes.  Advised to use the Lidoderm patch on that area.  Since then she reports that she is not sure if her symptoms have been worsening feels about the same she has been able to get around her house and do her activities with no issues.  She has tried Tylenol after hours and some warm water, is not sure if either of those helped.  She denies any new symptoms such as numbness, weakness, fevers, chills, nausea, vomiting, chest pain, shortness of breath, or any other symptoms.  On exam she has some mild TTP over the bilateral lumbar muscle areas and right posterior lower leg, no rash or lesions noted, neurological exam was intact. Overall this seems to be consistent with a muscle strain. Discussed continuing supportive care and reassured patient. Advised on red flag symptoms and to come back in if symptoms don't improve or start to worsen.   -Baclofen PRN -Advised to use warm compresses and do stretches as needed.

## 2020-03-22 NOTE — Progress Notes (Signed)
CC: Low back pain  HPI:  Bridget Owens is a 64 y.o. with the history listed below presenting for low back pain.   Past Medical History:  Diagnosis Date  . Adenomatous colon polyp 6/09   Resected on colonoscopy, no high grade dysplasia.   . Allergic rhinitis   . Bell's palsy 01/2009   Left sided  . Chest pain    Exercise stress test negative 6/07  . Colon polyps    03/22/2008: adenomatous polyps x3 w no dysplasia. Needs repeat colonoscopy in 3 years  . Diabetic peripheral neuropathy ([place])   . External hemorrhoid   . GERD (gastroesophageal reflux disease)   . Hyperlipidemia   . Hypertension   . Hypertensive retinopathy    Followed by Dr. Herbert Deaner.  . Insomnia   . Intolerance, drug    Leg cramps on Lipitor  . Lipoma 12/11   Posterior neck.   . Osteoarthritis    Carpometacarpal joint of right thumb  . Postmenopausal bleeding 9/05   Endometrial biopsy showed  FOCAL TUBAL METAPLASIA   . Postmenopausal symptoms    Hot flashes, vaginal dryness, iritability, difficulty sleeping. as of 2005.  Marland Kitchen Sebaceous cyst of breast    Has been refered to derm.  . Trigger finger    Right thumb  . Type II diabetes mellitus ([place])   . Uterine fibroid    Review of Systems:   Constitutional: Negative for chills and fever.  Respiratory: Negative for shortness of breath.   Cardiovascular: Negative for chest pain and leg swelling.  Gastrointestinal: Negative for abdominal pain, nausea and vomiting.  MSK: Positive for back pain and right leg pain. Neurological: Negative for dizziness and headaches.    Physical Exam:  Vitals:   03/22/20 1112  BP: (!) 150/75  Pulse: 81  Temp: 98.1 F (36.7 C)  TempSrc: Oral  SpO2: 99%  Weight: 176 lb 6.4 oz (80 kg)  Height: 5\' 4"  (1.626 m)   Physical Exam Constitutional:      Appearance: Normal appearance.  HENT:     Head: Normocephalic and atraumatic.     Right Ear: Tympanic membrane normal.     Nose: Nose normal.     Mouth/Throat:      Mouth: Mucous membranes are moist.     Pharynx: Oropharynx is clear.  Eyes:     Extraocular Movements: Extraocular movements intact.     Pupils: Pupils are equal, round, and reactive to light.  Cardiovascular:     Rate and Rhythm: Normal rate and regular rhythm.     Pulses: Normal pulses.     Heart sounds: Normal heart sounds.  Pulmonary:     Effort: Pulmonary effort is normal.     Breath sounds: Normal breath sounds.  Abdominal:     General: Abdomen is flat. Bowel sounds are normal.     Palpations: Abdomen is soft.  Musculoskeletal:        General: Tenderness (Mild TTP over posterior right lower extremity) present. No swelling. Normal range of motion.     Cervical back: Normal range of motion and neck supple.     Right lower leg: No edema.     Left lower leg: No edema.  Skin:    General: Skin is warm and dry.     Capillary Refill: Capillary refill takes less than 2 seconds.  Neurological:     General: No focal deficit present.     Mental Status: She is alert and oriented to person, place, and time.  Sensory: No sensory deficit.     Motor: No weakness.  Psychiatric:        Mood and Affect: Mood normal.        Behavior: Behavior normal.    Assessment & Plan:   See Encounters Tab for problem based charting.  Patient discussed with Dr. Heber [place]

## 2020-03-22 NOTE — Patient Instructions (Addendum)
[name],  It was a pleasure to see you today. Thank you for coming in.   Today we discussed your back pain.  I am sorry that you had that fall. Your exam and imaging are all reassuring that there is nothing fractured. Your pain is likely due to muscle pains. I have sent in a medication called baclofen that can help with muscle pains, please do not take this with the tylenol PM. You can take NSAIDs, such as aspirin or ibuprofen. You can also use the heating pads for those areas.    Please return to clinic in 1-2 months or sooner if needed.   Thank you again for coming in.   Lonia Skinner M.D.  Muscle Strain A muscle strain is an injury that occurs when a muscle is stretched beyond its normal length. Usually, a small number of muscle fibers are torn when this happens. There are three types of muscle strains. First-degree strains have the least amount of muscle fiber tearing and the least amount of pain. Second-degree and third-degree strains have more tearing and pain. Usually, recovery from muscle strain takes 1-2 weeks. Complete healing normally takes 5-6 weeks. What are the causes? This condition is caused when a sudden, violent force is placed on a muscle and stretches it too far. This may occur with a fall, lifting, or sports. What increases the risk? This condition is more likely to develop in athletes and people who are physically active. What are the signs or symptoms? Symptoms of this condition include:  Pain.  Bruising.  Swelling.  Trouble using the muscle. How is this diagnosed? This condition is diagnosed based on a physical exam and your medical history. Tests may also be done, including an X-ray, ultrasound, or MRI. How is this treated? This condition is initially treated with PRICE therapy. This therapy involves:  Protecting the muscle from being injured again.  Resting the injured muscle.  Icing the injured muscle.  Applying pressure (compression) to  the injured muscle. This may be done with a splint or elastic bandage.  Raising (elevating) the injured muscle. Your health care provider may also recommend medicine for pain. Follow these instructions at home: If you have a splint:  Wear the splint as told by your health care provider. Remove it only as told by your health care provider.  Loosen the splint if your fingers or toes tingle, become numb, or turn cold and blue.  Keep the splint clean.  If the splint is not waterproof: ? Do not let it get wet. ? Cover it with a watertight covering when you take a bath or a shower. Managing pain, stiffness, and swelling   If directed, put ice on the injured area. ? If you have a removable splint, remove it as told by your health care provider. ? Put ice in a plastic bag. ? Place a towel between your skin and the bag. ? Leave the ice on for 20 minutes, 2-3 times a day.  Move your fingers or toes often to avoid stiffness and to lessen swelling.  Raise (elevate) the injured area above the level of your heart while you are sitting or lying down.  Wear an elastic bandage as told by your health care provider. Make sure that it is not too tight. General instructions  Take over-the-counter and prescription medicines only as told by your health care provider.  Restrict your activity and rest the injured muscle as told by your health care provider. Gentle movements  may be allowed.  If physical therapy was prescribed, do exercises as told by your health care provider.  Do not put pressure on any part of the splint until it is fully hardened. This may take several hours.  Do not use any products that contain nicotine or tobacco, such as cigarettes and e-cigarettes. These can delay bone healing. If you need help quitting, ask your health care provider.  Ask your health care provider when it is safe to drive if you have a splint.  Keep all follow-up visits as told by your health care  provider. This is important. How is this prevented?  Warm up before exercising. This helps to prevent future muscle strains. Contact a health care provider if:  You have more pain or swelling in the injured area. Get help right away if:  You have numbness or tingling or lose a lot of strength in the injured area. Summary  A muscle strain is an injury that occurs when a muscle is stretched beyond its normal length.  This condition is caused when a sudden, violent force is placed on a muscle and stretches it too far.  This condition is initially treated with PRICE therapy, which involves protecting, resting, icing, compressing, and elevating.  Gentle movements may be allowed. If physical therapy was prescribed, do exercises as told by your health care provider. This information is not intended to replace advice given to you by your health care provider. Make sure you discuss any questions you have with your health care provider. Document Revised: 09/06/2017 Document Reviewed: 10/31/2016 Elsevier Patient Education  2020 [employer].

## 2020-03-23 NOTE — Progress Notes (Signed)
Internal Medicine Clinic Attending  Case discussed with Dr. Krienke at the time of the visit.  We reviewed the resident's history and exam and pertinent patient test results.  I agree with the assessment, diagnosis, and plan of care documented in the resident's note.    

## 2020-03-25 ENCOUNTER — Other Ambulatory Visit: Payer: Self-pay

## 2020-03-25 ENCOUNTER — Encounter (HOSPITAL_COMMUNITY): Payer: Self-pay | Admitting: *Deleted

## 2020-03-25 ENCOUNTER — Emergency Department (HOSPITAL_COMMUNITY): Payer: Medicare Other

## 2020-03-25 ENCOUNTER — Emergency Department (HOSPITAL_COMMUNITY)
Admission: EM | Admit: 2020-03-25 | Discharge: 2020-03-25 | Disposition: A | Discharge status: Home / Self Care | Payer: Medicare Other | Attending: Emergency Medicine | Admitting: Emergency Medicine

## 2020-03-25 ENCOUNTER — Ambulatory Visit (HOSPITAL_COMMUNITY)
Admission: EM | Admit: 2020-03-25 | Discharge: 2020-03-25 | Disposition: A | Discharge status: Other Acute Inpatient Hospital | Payer: Medicare Other | Source: Home / Self Care

## 2020-03-25 DIAGNOSIS — Y939 Activity, unspecified: Secondary | ICD-10-CM | POA: Diagnosis not present

## 2020-03-25 DIAGNOSIS — Y999 Unspecified external cause status: Secondary | ICD-10-CM | POA: Diagnosis not present

## 2020-03-25 DIAGNOSIS — W19XXXA Unspecified fall, initial encounter: Secondary | ICD-10-CM | POA: Diagnosis not present

## 2020-03-25 DIAGNOSIS — Y92512 Supermarket, store or market as the place of occurrence of the external cause: Secondary | ICD-10-CM | POA: Insufficient documentation

## 2020-03-25 DIAGNOSIS — F17210 Nicotine dependence, cigarettes, uncomplicated: Secondary | ICD-10-CM | POA: Insufficient documentation

## 2020-03-25 DIAGNOSIS — W19XXXD Unspecified fall, subsequent encounter: Secondary | ICD-10-CM

## 2020-03-25 DIAGNOSIS — S0990XA Unspecified injury of head, initial encounter: Secondary | ICD-10-CM | POA: Diagnosis not present

## 2020-03-25 DIAGNOSIS — E1140 Type 2 diabetes mellitus with diabetic neuropathy, unspecified: Secondary | ICD-10-CM | POA: Diagnosis not present

## 2020-03-25 DIAGNOSIS — I10 Essential (primary) hypertension: Secondary | ICD-10-CM | POA: Insufficient documentation

## 2020-03-25 NOTE — ED Triage Notes (Signed)
Pt fell Sat at [facility], she was seen at [facility], now c/o knot on head and pain when touched

## 2020-03-25 NOTE — ED Provider Notes (Signed)
[facility] DEPT Provider Note   CSN: [id number] Arrival date & time: 03/25/20  1501     History Chief Complaint  Patient presents with  . Head Injury    Bridget Owens is a 64 y.o. female.  She is here with a complaint of pain at the top of her head after a fall last week.  She said she fell at [facility] 6 days ago.  She was evaluated in the ED and had some x-rays of her back and right ankle.  She said that is getting somewhat better.  Last evening she noticed more pain at the top of her head.  No blurry vision double vision numbness or weakness.  She feels that her head pain is due to her recent fall.  She is not sure if she struck her head.  No other acute complaints.  The history is provided by the patient.  Head Injury Location:  Frontal Time since incident:  5 days Mechanism of injury: fall   Fall:    Fall occurred:  Standing   Impact surface:  Hard floor   Point of impact:  Unable to specify Pain details:    Quality:  Sharp   Severity:  Moderate   Duration:  2 days   Timing:  Constant   Progression:  Unchanged Chronicity:  New Relieved by:  None tried Worsened by:  Pressure Ineffective treatments:  None tried Associated symptoms: headache   Associated symptoms: no blurred vision, no disorientation, no double vision, no focal weakness, no loss of consciousness, no nausea, no neck pain, no numbness and no vomiting        Past Medical History:  Diagnosis Date  . Adenomatous colon polyp 6/09   Resected on colonoscopy, no high grade dysplasia.   . Allergic rhinitis   . Bell's palsy 01/2009   Left sided  . Chest pain    Exercise stress test negative 6/07  . Colon polyps    03/22/2008: adenomatous polyps x3 w no dysplasia. Needs repeat colonoscopy in 3 years  . Diabetic peripheral neuropathy ([place])   . External hemorrhoid   . GERD (gastroesophageal reflux disease)   . Hyperlipidemia   . Hypertension   . Hypertensive retinopathy     Followed by Dr. Herbert Deaner.  . Insomnia   . Intolerance, drug    Leg cramps on Lipitor  . Lipoma 12/11   Posterior neck.   . Osteoarthritis    Carpometacarpal joint of right thumb  . Postmenopausal bleeding 9/05   Endometrial biopsy showed  FOCAL TUBAL METAPLASIA   . Postmenopausal symptoms    Hot flashes, vaginal dryness, iritability, difficulty sleeping. as of 2005.  Marland Kitchen Sebaceous cyst of breast    Has been refered to derm.  . Trigger finger    Right thumb  . Type II diabetes mellitus ([place])   . Uterine fibroid     Patient Active Problem List   Diagnosis Date Noted  . Back pain 03/22/2020  . Vulvovaginitis 02/02/2020  . Osteoporosis screening 02/02/2020  . Hand pain, left 10/27/2019  . Non-scarring hair loss 07/27/2019  . Chronic pruritus 12/31/2018  . Cramping of hands 06/02/2018  . Numbness of left thumb 05/14/2018  . Vaginal burning 05/14/2018  . At risk for polypharmacy 02/27/2018  . Viral URI with cough 10/11/2017  . Hypertrophic toenail 09/05/2017  . Pulmonary fibrosis, unspecified ([place]) 09/05/2016  . Left flank pain 08/24/2016  . Burning chest pain 05/22/2016  . Pruritus 01/09/2016  . Mixed  incontinence urge and stress 12/21/2014  . Neuropathy, lower extremity 12/21/2014  . Seasonal allergies 10/06/2014  . Ear pain, bilateral 05/30/2014  . Cigarette smoker 04/03/2013  . Health care maintenance 02/03/2013  . 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 Hemorrhoids 02/07/2012  . GERD (gastroesophageal reflux disease) 08/13/2011  . Type 2 diabetes mellitus with peripheral neuropathy ([place]) 11/11/2006  . Hyperlipidemia 11/11/2006  . Hypertensive retinopathy 11/11/2006  . Essential hypertension 11/11/2006  . Hot flashes due to menopause 11/11/2006    Past Surgical History:  Procedure Laterality Date  . TUBAL LIGATION       OB History   No obstetric history on file.     Family History  Problem Relation Age of Onset  . Pneumonia Mother   . Diabetes Mother     . Early death Father   . Diabetes Sister   . Diabetes Brother   . Diabetes Maternal Grandmother     Social History   Tobacco Use  . Smoking status: Current Every Day Smoker    Packs/day: 1.00    Years: 30.00    Pack years: 30.00    Types: Cigarettes  . Smokeless tobacco: Former Systems developer    Quit date: 11/28/2010  . Tobacco comment: 1 PPD sometimes more   Substance Use Topics  . Alcohol use: No    Alcohol/week: 0.0 standard drinks  . Drug use: No    Home Medications Prior to Admission medications   Medication Sig Start Date End Date Taking? Authorizing Provider  Accu-Chek FastClix Lancets MISC Check up to two times a day. 11/05/19   Katherine Roan, MD  albuterol (PROVENTIL HFA) 108 (90 Base) MCG/ACT inhaler Inhale 1-2 puffs into the lungs every 6 (six) hours as needed for wheezing or shortness of breath. 10/11/17   Kalman Shan Ratliff, DO  amLODipine (NORVASC) 10 MG tablet TAKE 1 TABLET BY MOUTH EVERY EVENING 07/15/19   Katherine Roan, MD  aspirin 81 MG EC tablet Take 81 mg by mouth daily.      [provider]  atorvastatin (LIPITOR) 40 MG tablet TAKE 1 TABLET(40 MG) BY MOUTH DAILY 12/25/18   Katherine Roan, MD  baclofen (LIORESAL) 10 MG tablet Take 1 tablet (10 mg total) by mouth 3 (three) times daily as needed for muscle spasms. 03/22/20   Asencion Noble, MD  Blood Glucose Monitoring Suppl (ACCU-CHEK GUIDE) w/Device KIT USE AS DIRECTED 10/28/18   Katherine Roan, MD  cetirizine (ZYRTEC) 5 MG tablet Take 1 tablet (5 mg total) by mouth daily. 02/02/20   Katherine Roan, MD  ciclopirox (PENLAC) 8 % solution Apply topically at bedtime. Apply over nail and surrounding skin. Apply daily over previous coat. After seven (7) days, may remove with alcohol and continue cycle. 09/23/17   Trula Slade, DPM  fluticasone (FLONASE) 50 MCG/ACT nasal spray Place 1 spray into both nostrils daily. 06/29/19   Katherine Roan, MD  gabapentin (NEURONTIN) 300 MG capsule  Take 1 capsule (300 mg total) by mouth at bedtime. 07/14/18   Bloom[place], Carley D, DO  glucose blood (ACCU-CHEK GUIDE) test strip Check blood sugar up to 2 times per day 11/05/19   Katherine Roan, MD  guaiFENesin-dextromethorphan (ROBITUSSIN DM) 100-10 MG/5ML syrup Take 5 mLs by mouth every 4 (four) hours as needed for cough. 06/29/19   Katherine Roan, MD  hydrochlorothiazide (HYDRODIURIL) 25 MG tablet TAKE 1 TABLET BY MOUTH DAILY 01/29/20   Katherine Roan, MD  ibuprofen (ADVIL,MOTRIN)  600 MG tablet Can use one tablet 2-3 times per week with food for hip pain. 10/10/18   Alphonzo Grieve, MD  Insulin Pen Needle (PEN NEEDLES) 31G X 5 MM MISC Inject 1 applicator into the skin once a week. diag code E11.42. insulin dependent 06/29/19   Katherine Roan, MD  metFORMIN (GLUCOPHAGE) 1000 MG tablet TAKE 1 TABLET(1000 MG) BY MOUTH TWICE DAILY WITH A MEAL 10/13/19   Katherine Roan, MD  nicotine (NICODERM CQ - DOSED IN MG/24 HOURS) 14 mg/24hr patch Apply 1 patch daily. Wear for 24 hours, then remove and replace with new patch. If you have sleep disturbances, remove at bedtime. 03/17/18   Katherine Roan, MD  nicotine polacrilex (NICORETTE) 2 MG gum RX #2 Weeks 7-9: 1 piece every 2-4 hours.Max 24 pieces per day. 03/17/18   Katherine Roan, MD  pantoprazole (PROTONIX) 40 MG tablet TAKE ONE TABLET BY MOUTH EVERY DAY 12/03/17   Katherine Roan, MD  Semaglutide,0.25 or 0.5MG/DOS, (OZEMPIC, 0.25 OR 0.5 MG/DOSE,) 2 MG/1.5ML SOPN Inject 1 mg into the skin once a week. 02/01/20   Katherine Roan, MD  Vaginal Moisturizer (VAGISIL FEMININE MOISTURIZER) LOTN Place 1 application vaginally 3 (three) times a week. 10/13/18   Alphonzo Grieve, MD    Allergies    Codeine  Review of Systems   Review of Systems  Constitutional: Negative for fever.  HENT: Negative for sore throat.   Eyes: Negative for blurred vision, double vision and visual disturbance.  Respiratory: Negative for shortness of breath.     Cardiovascular: Negative for chest pain.  Gastrointestinal: Negative for abdominal pain, nausea and vomiting.  Genitourinary: Negative for dysuria.  Musculoskeletal: Positive for back pain. Negative for neck pain.  Skin: Negative for rash.  Neurological: Positive for headaches. Negative for focal weakness, loss of consciousness, speech difficulty, weakness and numbness.    Physical Exam Updated Vital Signs BP 131/82 (BP Location: Right Arm)   Pulse 78   Temp 98.1 F (36.7 C) (Oral)   Resp 16   Ht 5' 4" (1.626 m)   Wt 80 kg   SpO2 98%   BMI 30.29 kg/m   Physical Exam Vitals and nursing note reviewed.  Constitutional:      General: She is not in acute distress.    Appearance: Normal appearance. She is well-developed.  HENT:     Head: Normocephalic.     Comments: She has some tenderness over her forehead.  No open wounds.  No obvious skull depression.  No bruising noted. Eyes:     Conjunctiva/sclera: Conjunctivae normal.  Cardiovascular:     Rate and Rhythm: Normal rate and regular rhythm.     Heart sounds: No murmur heard.   Pulmonary:     Effort: Pulmonary effort is normal. No respiratory distress.     Breath sounds: Normal breath sounds.  Abdominal:     Palpations: Abdomen is soft.     Tenderness: There is no abdominal tenderness.  Musculoskeletal:        General: No deformity. Normal range of motion.     Cervical back: Neck supple. No tenderness.  Skin:    General: Skin is warm and dry.     Capillary Refill: Capillary refill takes less than 2 seconds.  Neurological:     General: No focal deficit present.     Mental Status: She is alert.     Sensory: No sensory deficit.     Motor: No weakness.     Gait:  Gait normal.     ED Results / Procedures / Treatments   Labs (all labs ordered are listed, but only abnormal results are displayed) Labs Reviewed - No data to display  EKG None  Radiology CT Head Wo Contrast  Result Date: 03/25/2020 CLINICAL DATA:   Fall at [place]: CT HEAD WITHOUT CONTRAST TECHNIQUE: Contiguous axial images were obtained from the base of the skull through the vertex without intravenous contrast. COMPARISON:  CT 05/10/2004, MRI 02/19/2013, CT 11/25/2012 FINDINGS: Brain: Stable appearance of a fat attenuation lesion measuring 7 mm in diameter along the left tentorium, previously characterized as a benign dural lipoma. No evidence of acute infarction, hemorrhage, hydrocephalus, extra-axial collection or mass lesion/mass effect. Patchy areas of white matter hypoattenuation are most compatible with chronic microvascular angiopathy. Vascular: Atherosclerotic calcification of the carotid siphons. No hyperdense vessel. Skull: Mild left occipital scalp swelling (3/23) with small crescentic hematoma measuring up to 5 mm in maximal thickness. No calvarial fracture is seen. No worrisome osseous lesions. Sinuses/Orbits: Paranasal sinuses and mastoid air cells are predominantly clear. Included orbital structures are unremarkable. Other: None IMPRESSION: 1. Mild left occipital scalp swelling with small crescentic hematoma measuring up to 5 mm in maximal thickness. No calvarial fracture. 2. No acute intracranial abnormality. 3. 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 4. 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 Electronically Signed   By: Lovena Le M.D.   On: 03/25/2020 21:05    Procedures Procedures (including critical care time)  Medications Ordered in ED Medications - No data to display  ED Course  I have reviewed the triage vital signs and the nursing notes.  Pertinent labs & imaging results that were available during my care of the patient were reviewed by me and considered in my medical decision making (see chart for details).    MDM Rules/Calculators/A&P                         Differential diagnosis includes scalp contusion, skull fracture, intracranial bleed including subdural  epidural cerebral contusion.  Patient's imaging reassuring.  Reviewed this with the patient.  Return instructions discussed.  Final Clinical Impression(s) / ED Diagnoses Final diagnoses:  Injury of head, initial encounter  Fall, subsequent encounter    Rx / DC Orders ED Discharge Orders    None       Hayden Rasmussen, MD 03/26/20 1102

## 2020-03-25 NOTE — Discharge Instructions (Addendum)
You were seen in the emergency department for evaluation of pain in the top of your head possibly related to a fall last week.  You had a CAT scan of your head that did not show any obvious fracture or bleeding on the brain.  You should use ice to the affected area.  Tylenol or ibuprofen as needed for pain.  Follow-up with your doctor.  Return to the emergency department if any worsening or concerning symptoms.

## 2020-03-25 NOTE — ED Notes (Signed)
Patient is being discharged from the Urgent [facility] and sent to the Emergency Department via personal vehicle by family member. Per Provider Loura Halt, patient is stable but in need of higher level of care due to head injury. Patient is aware and verbalizes understanding of plan of care. There were no vitals filed for this visit.

## 2020-03-29 ENCOUNTER — Other Ambulatory Visit: Payer: Self-pay | Admitting: Internal Medicine

## 2020-03-29 NOTE — Telephone Encounter (Signed)
She may be taking more than prescribed, I will forward this to Dr. Sherry Ruffing as I was not the original prescriber.

## 2020-03-29 NOTE — Telephone Encounter (Signed)
Called pt - stated she does not need a refill on Baclofen. But worried about her head - she went to ED on 6/18 for head injury. Offered her an appt - declined; stated she will call back if she does not feel better.

## 2020-04-15 ENCOUNTER — Ambulatory Visit: Payer: Medicare Other | Admitting: Podiatry

## 2020-04-21 ENCOUNTER — Telehealth: Payer: Self-pay | Admitting: *Deleted

## 2020-04-21 NOTE — Telephone Encounter (Signed)
I agree

## 2020-04-21 NOTE — Telephone Encounter (Signed)
Calls and states she thinks she has COVID States she is tired all the time, she is ask if this is different than normal for her, "NO" she does state she had a h/a 7/14 Denies N&V Aches, pains Dry Cough Sore throat Runny nose Constant h/a Fevers Loss of taste Rash Has not been around anyone that she knows is +POS+ She is advised to stay away from any known COVID +POS+ persons Drink plenty of fluid Keep hands clean and away from face Keep surfaces clean Open her windows and let house "air out" Rest as much as possible Wear a mask if she is around others She then ask when she can come to clinic for vaccine she is given vaccine site information  Do you agree?

## 2020-05-05 ENCOUNTER — Ambulatory Visit (INDEPENDENT_AMBULATORY_CARE_PROVIDER_SITE_OTHER): Payer: Medicare Other | Admitting: Student

## 2020-05-05 ENCOUNTER — Encounter: Payer: Self-pay | Admitting: Student

## 2020-05-05 VITALS — BP 132/63 | HR 92 | Temp 98.9°F | Ht 64.0 in | Wt 173.5 lb

## 2020-05-05 DIAGNOSIS — E1142 Type 2 diabetes mellitus with diabetic polyneuropathy: Secondary | ICD-10-CM | POA: Diagnosis not present

## 2020-05-05 DIAGNOSIS — L299 Pruritus, unspecified: Secondary | ICD-10-CM

## 2020-05-05 DIAGNOSIS — Z7984 Long term (current) use of oral hypoglycemic drugs: Secondary | ICD-10-CM | POA: Diagnosis not present

## 2020-05-05 LAB — GLUCOSE, CAPILLARY: Glucose-Capillary: 105 mg/dL — ABNORMAL HIGH (ref 70–99)

## 2020-05-05 LAB — POCT GLYCOSYLATED HEMOGLOBIN (HGB A1C): Hemoglobin A1C: 6.1 % — AB (ref 4.0–5.6)

## 2020-05-05 MED ORDER — LIDOCAINE 4 % EX LOTN: 1.0000 "application " | TOPICAL_LOTION | Freq: Three times a day (TID) | CUTANEOUS | 0 refills | Status: DC | PRN

## 2020-05-05 MED ORDER — OZEMPIC (0.25 OR 0.5 MG/DOSE) 2 MG/1.5ML ~~LOC~~ SOPN: 1.0000 mg | PEN_INJECTOR | SUBCUTANEOUS | 1 refills | Status: DC

## 2020-05-05 NOTE — Patient Instructions (Addendum)
Today, we discussed that your rash could be due to shingles, although is not a typical-appearing shingles rash, and may resolve on its own naturally.   I have prescribed Lidocaine lotion to your pharmacy. Please use this three times per day as needed for itching and burning. I have also refilled your Ozempic.  Please call us at [phone] if your rash worsens or becomes bumpy.  We will check your blood sugars today and I will call with results. Please make a follow up appointment in September (about 6 weeks).  Thank you,  Dr. Jeralyn Bennett   Rash, Adult A rash is a change in the color of your skin. A rash can also change the way your skin feels. There are many different conditions and factors that can cause a rash. Some rashes may disappear after a few days, but some may last for a few weeks. Common causes of rashes include:  Viral infections, such as: ? Colds. ? Measles. ? Hand, foot, and mouth disease.  Bacterial infections, such as: ? Scarlet fever. ? Impetigo.  Fungal infections, such as Candida.  Allergic reactions to food, medicines, or skin care products. Follow these instructions at home: The goal of treatment is to stop the itching and keep the rash from spreading. Pay attention to any changes in your symptoms. Follow these instructions to help with your condition: Medicine Take or apply over-the-counter and prescription medicines only as told by your health care provider. These may include:  Corticosteroid creams to treat red or swollen skin.  Anti-itch lotions.  Oral allergy medicines (antihistamines).  Oral corticosteroids for severe symptoms.  Skin care  Apply cool compresses to the affected areas.  Do not scratch or rub your skin.  Avoid covering the rash. Make sure the rash is exposed to air as much as possible. Managing itching and discomfort  Avoid hot showers or baths, which can make itching worse. A cold shower may help.  Try taking a bath  with: ? Epsom salts. Follow manufacturer instructions on the packaging. You can get these at your local pharmacy or grocery store. ? Baking soda. Pour a small amount into the bath as told by your health care provider. ? Colloidal oatmeal. Follow manufacturer instructions on the packaging. You can get this at your local pharmacy or grocery store.  Try applying baking soda paste to your skin. Stir water into baking soda until it reaches a paste-like consistency.  Try applying calamine lotion. This is an over-the-counter lotion that helps to relieve itchiness.  Keep cool and out of the sun. Sweating and being hot can make itching worse. General instructions   Rest as needed.  Drink enough fluid to keep your urine pale yellow.  Wear loose-fitting clothing.  Avoid scented soaps, detergents, and perfumes. Use gentle soaps, detergents, perfumes, and other cosmetic products.  Avoid any substance that causes your rash. Keep a journal to help track what causes your rash. Write down: ? What you eat. ? What cosmetic products you use. ? What you drink. ? What you wear. This includes jewelry.  Keep all follow-up visits as told by your health care provider. This is important. Contact a health care provider if:  You sweat at night.  You lose weight.  You urinate more than normal.  You urinate less than normal, or you notice that your urine is a darker color than usual.  You feel weak.  You vomit.  Your skin or the whites of your eyes look yellow (jaundice).  Your  skin: ? Tingles. ? Is numb.  Your rash: ? Does not go away after several days. ? Gets worse.  You are: ? Unusually thirsty. ? More tired than normal.  You have: ? New symptoms. ? Pain in your abdomen. ? A fever. ? Diarrhea. Get help right away if you:  Have a fever and your symptoms suddenly get worse.  Develop confusion.  Have a severe headache or a stiff neck.  Have severe joint pains or  stiffness.  Have a seizure.  Develop a rash that covers all or most of your body. The rash may or may not be painful.  Develop blisters that: ? Are on top of the rash. ? Grow larger or grow together. ? Are painful. ? Are inside your nose or mouth.  Develop a rash that: ? Looks like purple pinprick-sized spots all over your body. ? Has a "bull's eye" or looks like a target. ? Is not related to sun exposure, is red and painful, and causes your skin to peel. Summary  A rash is a change in the color of your skin. Some rashes disappear after a few days, but some may last for a few weeks.  The goal of treatment is to stop the itching and keep the rash from spreading.  Take or apply over-the-counter and prescription medicines only as told by your health care provider.  Contact a health care provider if you have new or worsening symptoms.  Keep all follow-up visits as told by your health care provider. This is important. This information is not intended to replace advice given to you by your health care provider. Make sure you discuss any questions you have with your health care provider. Document Revised: 01/16/2019 Document Reviewed: 04/28/2018 Elsevier Patient Education  [place].

## 2020-05-05 NOTE — Progress Notes (Signed)
59

## 2020-05-05 NOTE — Assessment & Plan Note (Addendum)
A: Hemoglobin A1c decreased from 9.6 in January 2021 to 6.8 on 02/02/20. She is currently on Metformin 1000mg  BID and Semaglutide 1mg  per week. No symptoms of polydipsia or polyuria today.   P: Will recheck HbA1c today and have patient follow up with new PCP in 2 months.

## 2020-05-05 NOTE — Progress Notes (Signed)
   CC: Rash  HPI:  [name] is a 64 y.o. female with PMHx of type II DM, pulmonary fibrosis, HTN, HLD and GERD presenting with complaint of a rash on her back that she first noticed 5 nights ago. She states she has a small area on her left upper back that is painful with some swelling that "burns". She says this burning has waxed and waned since onset and is associated with intermittent itching. Her pain and itching are worse when she sweats or gets her skin wet. She does note she has tried a new [employer] recently. She notes a history of chicken pox. She endorses sweating but denies any fevers or chills.  Past Medical History:  Diagnosis Date  . Adenomatous colon polyp 6/09   Resected on colonoscopy, no high grade dysplasia.   . Allergic rhinitis   . Bell's palsy 01/2009   Left sided  . Chest pain    Exercise stress test negative 6/07  . Colon polyps    03/22/2008: adenomatous polyps x3 w no dysplasia. Needs repeat colonoscopy in 3 years  . Diabetic peripheral neuropathy ([place])   . External hemorrhoid   . GERD (gastroesophageal reflux disease)   . Hyperlipidemia   . Hypertension   . Hypertensive retinopathy    Followed by Dr. Herbert Deaner.  . Insomnia   . Intolerance, drug    Leg cramps on Lipitor  . Lipoma 12/11   Posterior neck.   . Osteoarthritis    Carpometacarpal joint of right thumb  . Postmenopausal bleeding 9/05   Endometrial biopsy showed  FOCAL TUBAL METAPLASIA   . Postmenopausal symptoms    Hot flashes, vaginal dryness, iritability, difficulty sleeping. as of 2005.  Marland Kitchen Sebaceous cyst of breast    Has been refered to derm.  . Trigger finger    Right thumb  . Type II diabetes mellitus ([place])   . Uterine fibroid    PSHx: Patient states she continues to smoke and denies alcohol or other drug use. She has family at home who are able to help her apply lotion to her back.   Allergies: Codeine (unspecified reaction)  Review of Systems:  All others negative except as  noted in HPI.    Vitals:   05/05/20 1423  BP: (!) 132/63  Pulse: 92  Temp: 98.9 F (37.2 C)  TempSrc: Oral  SpO2: 99%  Weight: 173 lb 8 oz (78.7 kg)  Height: 5\' 4"  (1.626 m)   Physical Exam: Constitutional: Patient appears well. No acute distress. Eyes: No conjunctival injection. Sclera non-icteric.  HENT: Moist mucus membranes. No oral lesions.  Respiratory: Lungs are clear to auscultation, bilaterally. No wheezes, rales, or rhonchi. Cardiovascular: Regular rate and rhythm. No murmurs, rubs, or gallops. No LE edema.  Skin: Patient endorses itching with palpation of the T1-T2 dermatomal areas of her left upper back. There is very mild hyperpigmentation and swelling in a 3x3" segment of skin around this area without papules, vesicles, or other raised lesions.  No other rashes or lesions noted. Patient is mildly diaphoretic.   Assessment & Plan:   See Encounters Tab for problem based charting.  Patient seen with Dr. Evette Doffing.  Jeralyn Bennett, PGY1 [facility]  Pager: [phone]

## 2020-05-05 NOTE — Assessment & Plan Note (Addendum)
A: Patient states she has had waxing and waning burning pain and intermittent itching, worse with diaphoresis, in a 3x3" area of skin on her upper left back. There is minimal hyperpigmentation and swelling of this region on physical exam without papules or vesicles. Patient denies pain but endorses itching on palpation of the area. No fevers, chills, or other systemic symptoms. Symptoms may represent atypical shingles, although may also represent nonspecific contact dermatitis.  P: Prescribed 4% topical lidocaine cream for her to apply TID PRN for pain and itching.  - Patient was instructed to call if symptoms do not improve or worsen or if she notices any raised lesions.

## 2020-05-05 NOTE — Assessment & Plan Note (Signed)
>>  ASSESSMENT AND PLAN FOR NOTALGIA PARESTHETICA WRITTEN ON 05/05/2020  5:09 PM BY SPEAKMAN, RACHEL, MD  A: Patient states she has had waxing and waning burning pain and intermittent itching, worse with diaphoresis, in a 3x3" area of skin on her upper left back. There is minimal hyperpigmentation and swelling of this region on physical exam without papules or vesicles. Patient denies pain but endorses itching on palpation of the area. No fevers, chills, or other systemic symptoms. Symptoms may represent atypical shingles, although may also represent nonspecific contact dermatitis.  P: Prescribed 4% topical lidocaine cream for her to apply TID PRN for pain and itching.  - Patient was instructed to call if symptoms do not improve or worsen or if she notices any raised lesions.

## 2020-05-06 ENCOUNTER — Telehealth: Payer: Self-pay | Admitting: Student

## 2020-05-06 NOTE — Progress Notes (Signed)
Internal Medicine Clinic Attending  I saw and evaluated the patient.  I personally confirmed the key portions of the history and exam documented by Dr. Speakman and I reviewed pertinent patient test results.  The assessment, diagnosis, and plan were formulated together and I agree with the documentation in the resident's note.  

## 2020-05-06 NOTE — Telephone Encounter (Signed)
Asked to leave a message for Ms. Sharps regarding her lab results, but patient was not available and the phone was disconnected. HgbA1c within pre-diabetic range with improvement since prior visit. Continuing current medication regimen.  Jeralyn Bennett, PGY1 Internal Medicine [phone]

## 2020-05-09 ENCOUNTER — Ambulatory Visit: Payer: Medicare Other | Admitting: Podiatry

## 2020-05-26 NOTE — Progress Notes (Deleted)
   CC: ***  HPI:  [name] is a 64 y.o.   Past Medical History:  Diagnosis Date  . Adenomatous colon polyp 6/09   Resected on colonoscopy, no high grade dysplasia.   . Allergic rhinitis   . Bell's palsy 01/2009   Left sided  . Chest pain    Exercise stress test negative 6/07  . Colon polyps    03/22/2008: adenomatous polyps x3 w no dysplasia. Needs repeat colonoscopy in 3 years  . Diabetic peripheral neuropathy (Bridget Owens)   . External hemorrhoid   . GERD (gastroesophageal reflux disease)   . Hyperlipidemia   . Hypertension   . Hypertensive retinopathy    Followed by Dr. Herbert Owens.  . Insomnia   . Intolerance, drug    Leg cramps on Lipitor  . Lipoma 12/11   Posterior neck.   . Osteoarthritis    Carpometacarpal joint of right thumb  . Postmenopausal bleeding 9/05   Endometrial biopsy showed  FOCAL TUBAL METAPLASIA   . Postmenopausal symptoms    Hot flashes, vaginal dryness, iritability, difficulty sleeping. as of 2005.  Marland Kitchen Sebaceous cyst of breast    Has been refered to derm.  . Trigger finger    Right thumb  . Type II diabetes mellitus ([place])   . Uterine fibroid    Review of Systems:  ***  Physical Exam:  There were no vitals filed for this visit. ***  Assessment & Plan:   See Encounters Tab for problem based charting.  Patient {GC/GE:[id number]::"discussed with","seen with"} Dr. {NAMES:[id number]::"Bridget Owens","Bridget Owens","Bridget Owens","Bridget Owens","Bridget Owens","Bridget Owens","Bridget Owens"}

## 2020-05-27 ENCOUNTER — Encounter: Payer: Medicare Other | Admitting: Internal Medicine

## 2020-05-27 NOTE — Progress Notes (Signed)
CC: Left back pain and itchiness  HPI:  Bridget Owens is a 64 y.o. with history listed below including hyperlipidemia, hypertension, GERD, diabetes presenting with persistent left upper back pain and itchiness.  Past Medical History:  Diagnosis Date  . Adenomatous colon polyp 6/09   Resected on colonoscopy, no high grade dysplasia.   . Allergic rhinitis   . Bell's palsy 01/2009   Left sided  . Chest pain    Exercise stress test negative 6/07  . Colon polyps    03/22/2008: adenomatous polyps x3 w no dysplasia. Needs repeat colonoscopy in 3 years  . Diabetic peripheral neuropathy ([place])   . External hemorrhoid   . GERD (gastroesophageal reflux disease)   . Hyperlipidemia   . Hypertension   . Hypertensive retinopathy    Followed by Dr. Herbert Deaner.  . Insomnia   . Intolerance, drug    Leg cramps on Lipitor  . Lipoma 12/11   Posterior neck.   . Osteoarthritis    Carpometacarpal joint of right thumb  . Postmenopausal bleeding 9/05   Endometrial biopsy showed  FOCAL TUBAL METAPLASIA   . Postmenopausal symptoms    Hot flashes, vaginal dryness, iritability, difficulty sleeping. as of 2005.  Marland Kitchen Sebaceous cyst of breast    Has been refered to derm.  . Trigger finger    Right thumb  . Type II diabetes mellitus ([place])   . Uterine fibroid    Review of Systems:   Constitutional: Negative for chills and fever.  Positive for generalized itchiness. Respiratory: Negative for shortness of breath.   Cardiovascular: Negative for chest pain and leg swelling.  Musculoskeletal: Positive for left upper back pain. Gastrointestinal: Negative for abdominal pain, nausea and vomiting.  Neurological: Negative for dizziness and headaches.   Physical Exam:  Vitals:   05/30/20 1022 05/30/20 1028  BP:  130/65  Pulse:  79  Temp:  98.3 F (36.8 C)  TempSrc:  Oral  SpO2:  98%  Weight: 176 lb 14.4 oz (80.2 kg)   Height: 5\' 4"  (1.626 m)    Physical Exam Constitutional:      Appearance: Normal  appearance.  HENT:     Head: Normocephalic and atraumatic.     Mouth/Throat:     Mouth: Mucous membranes are moist.  Eyes:     Extraocular Movements: Extraocular movements intact.     Pupils: Pupils are equal, round, and reactive to light.  Cardiovascular:     Rate and Rhythm: Normal rate and regular rhythm.     Pulses: Normal pulses.     Heart sounds: Normal heart sounds.  Pulmonary:     Effort: Pulmonary effort is normal.     Breath sounds: Normal breath sounds.  Abdominal:     General: Abdomen is flat. Bowel sounds are normal.     Palpations: Abdomen is soft.  Musculoskeletal:        General: Swelling present. Normal range of motion.     Cervical back: Normal range of motion and neck supple.     Comments: Tenderness to palpation and light touch over left trapezius area, along T2-3 nerve roots, mild swelling, no erythema or warmth noted  Skin:    General: Skin is warm and dry.     Capillary Refill: Capillary refill takes less than 2 seconds.  Neurological:     General: No focal deficit present.     Mental Status: She is alert and oriented to person, place, and time.  Psychiatric:  Mood and Affect: Mood normal.        Behavior: Behavior normal.      Assessment & Plan:   See Encounters Tab for problem based charting.  Patient discussed with Dr. Rebeca Alert

## 2020-05-30 ENCOUNTER — Ambulatory Visit (INDEPENDENT_AMBULATORY_CARE_PROVIDER_SITE_OTHER): Payer: Medicare Other | Admitting: Internal Medicine

## 2020-05-30 ENCOUNTER — Other Ambulatory Visit: Payer: Self-pay

## 2020-05-30 ENCOUNTER — Encounter: Payer: Self-pay | Admitting: Internal Medicine

## 2020-05-30 VITALS — BP 130/65 | HR 79 | Temp 98.3°F | Ht 64.0 in | Wt 176.9 lb

## 2020-05-30 DIAGNOSIS — M546 Pain in thoracic spine: Secondary | ICD-10-CM | POA: Diagnosis not present

## 2020-05-30 DIAGNOSIS — L299 Pruritus, unspecified: Secondary | ICD-10-CM

## 2020-05-30 DIAGNOSIS — R222 Localized swelling, mass and lump, trunk: Secondary | ICD-10-CM

## 2020-05-30 DIAGNOSIS — E118 Type 2 diabetes mellitus with unspecified complications: Secondary | ICD-10-CM

## 2020-05-30 DIAGNOSIS — G8929 Other chronic pain: Secondary | ICD-10-CM

## 2020-05-30 MED ORDER — LIDOCAINE 4 % EX LOTN: 1.0000 "application " | TOPICAL_LOTION | Freq: Three times a day (TID) | CUTANEOUS | 0 refills | Status: DC | PRN

## 2020-05-30 NOTE — Assessment & Plan Note (Signed)
>>  ASSESSMENT AND PLAN FOR NOTALGIA PARESTHETICA WRITTEN ON 05/30/2020  3:59 PM BY Claudean Severance, MD  Patient reports that she continues to have left upper back pain and pruritus that has been going on for the past 2 months.  She reports that it started after a fall at [facility] where she slipped on some water and fell backwards. She was seen in the ER on 03/20/2020, she was having more lower back pain at that time, had a lumbar x-ray and right ankle x-ray that were unremarkable.  Felt that it was related to a muscle strain and she was given muscle relaxers at that time.  She was then seen on 05/05/2020 and noted to have waxing and waning burning pain and intermittent itchiness of her left upper back, minimal hyperpigmentation and swelling noted, felt that this could represent atypical shingles or a nonspecific contact dermatitis, she was prescribed a 4% topical lidocaine cream.  Since her last visit she reports she continues to have a waxing and waning burning pain and intermittent itchiness in the upper left back area.  Reports that it is sometimes sharp in nature rib pain, with no radiation.  She reports that will occur randomly, unclear if it is related to movement or rest.  She reports that a hot shower helps the pain.  She feels that it is related to her diabetes.  She has not tried anything for the pain.  She was given a prescription for the topical lidocaine cream however states that she lost this.  On exam she does have an area of mild swelling noted on the left upper back, along the T3-4 dermatomal areas, tenderness to light touch is noted, no erythema or warmth noted.  Overall symptoms still seem to be concerning for a neuropathic etiology.  Unlikely related to diabetes due to the localized area.  Atypical shingles could still be on the diagnosis, also could be related to a dorsal cutaneous nerve injury or thoracic spinal injury.  Will obtain a thoracic MRI to further evaluate this. The differential  muscle strain could still be a possibility, could consider PT evaluation of thoracic spine is negative.  -Thoracic MRI -Refill lidocaine cream -Advised to use warm compresses, over-the-counter NSAIDs, and stretching of the area -RTC in 1 month

## 2020-05-30 NOTE — Assessment & Plan Note (Signed)
Patient reports that she continues to have left upper back pain and pruritus that has been going on for the past 2 months.  She reports that it started after a fall at [facility] where she slipped on some water and fell backwards. She was seen in the ER on 03/20/2020, she was having more lower back pain at that time, had a lumbar x-ray and right ankle x-ray that were unremarkable.  Felt that it was related to a muscle strain and she was given muscle relaxers at that time.  She was then seen on 05/05/2020 and noted to have waxing and waning burning pain and intermittent itchiness of her left upper back, minimal hyperpigmentation and swelling noted, felt that this could represent atypical shingles or a nonspecific contact dermatitis, she was prescribed a 4% topical lidocaine cream.  Since her last visit she reports she continues to have a waxing and waning burning pain and intermittent itchiness in the upper left back area.  Reports that it is sometimes sharp in nature rib pain, with no radiation.  She reports that will occur randomly, unclear if it is related to movement or rest.  She reports that a hot shower helps the pain.  She feels that it is related to her diabetes.  She has not tried anything for the pain.  She was given a prescription for the topical lidocaine cream however states that she lost this.  On exam she does have an area of mild swelling noted on the left upper back, along the T3-4 dermatomal areas, tenderness to light touch is noted, no erythema or warmth noted.  Overall symptoms still seem to be concerning for a neuropathic etiology.  Unlikely related to diabetes due to the localized area.  Atypical shingles could still be on the diagnosis, also could be related to a dorsal cutaneous nerve injury or thoracic spinal injury.  Will obtain a thoracic MRI to further evaluate this. The differential muscle strain could still be a possibility, could consider PT evaluation of thoracic spine is  negative.  -Thoracic MRI -Refill lidocaine cream -Advised to use warm compresses, over-the-counter NSAIDs, and stretching of the area -RTC in 1 month

## 2020-05-30 NOTE — Patient Instructions (Signed)
[name],  It was a pleasure to see you today. Thank you for coming in.   Today we discussed your back pain and itchiness.  I have sent in a new prescription of the lidocaine cream, please start using it on that area.  I am getting some imaging to evaluate your spine.  Please continue stretching the area, you can try using warm compresses to see if this helps. You can try over the counter NSAIDs, such as ibuprofen.    Please return to clinic in 1 month or sooner if needed.   Thank you again for coming in.   Asencion Noble.D.

## 2020-05-31 LAB — MICROALBUMIN / CREATININE URINE RATIO
Creatinine, Urine: 127.4 mg/dL
Microalb/Creat Ratio: 5 mg/g creat (ref 0–29)
Microalbumin, Urine: 6.5 ug/mL

## 2020-05-31 NOTE — Progress Notes (Signed)
Internal Medicine Clinic Attending  Case discussed with Dr. Krienke at the time of the visit.  We reviewed the resident's history and exam and pertinent patient test results.  I agree with the assessment, diagnosis, and plan of care documented in the resident's note.  Alontae Chaloux, M.D., Ph.D.  

## 2020-06-03 ENCOUNTER — Encounter: Payer: Self-pay | Admitting: Podiatry

## 2020-06-03 ENCOUNTER — Other Ambulatory Visit: Payer: Self-pay

## 2020-06-03 ENCOUNTER — Ambulatory Visit (INDEPENDENT_AMBULATORY_CARE_PROVIDER_SITE_OTHER): Payer: Medicare Other | Admitting: Podiatry

## 2020-06-03 DIAGNOSIS — Q828 Other specified congenital malformations of skin: Secondary | ICD-10-CM

## 2020-06-03 DIAGNOSIS — E1142 Type 2 diabetes mellitus with diabetic polyneuropathy: Secondary | ICD-10-CM | POA: Diagnosis not present

## 2020-06-03 DIAGNOSIS — M21622 Bunionette of left foot: Secondary | ICD-10-CM

## 2020-06-03 DIAGNOSIS — M21621 Bunionette of right foot: Secondary | ICD-10-CM

## 2020-06-03 NOTE — Progress Notes (Signed)
Subjective: Bridget Owens presents today for follow up of preventative diabetic foot care and painful porokeratotic lesion(s) b/l feet and painful mycotic toenails b/l that limit ambulation. Aggravating factors include weightbearing with and without shoe gear. Pain for both is relieved with periodic professional debridement.  She relates her left foot lesion is most painful today.   Patient has thick, elongated, mycotic toenails b/l, but she does not want them cut. States she will be getting a Associate Professor on tomorrow.  Allergies  Allergen Reactions  . Codeine Other (See Comments)    "felt funny"     Objective: There were no vitals filed for this visit.  Pt is a pleasant 64 y.o. year old African American female  WD, WN  in NAD. AAO x 3.   Vascular Examination:  Capillary fill time to digits <3 seconds b/l. Faintly palpable pedal pulses b/l. Pedal hair present b/l. Skin temperature gradient within normal limits b/l.  Dermatological Examination: Pedal skin with normal turgor, texture and tone bilaterally. No open wounds bilaterally. No interdigital macerations bilaterally. Porokeratotic lesion(s) submet head 2 right foot, submet head 3 right foot, submet head 5 left foot and submet head 5 right foot. No erythema, no edema, no drainage, no flocculence.  Toenails 1-5 b/l elongated and thickened with teal blue nail polish. No erythema, no edema, no draiang  Musculoskeletal: Normal muscle strength 5/5 to all lower extremity muscle groups bilaterally. No pain crepitus or joint limitation noted with ROM b/l. Tailor's bunion deformity noted b/l.  Neurological: Protective sensation decreased with 10 gram monofilament b/l. Vibratory sensation intact b/l.  Assessment: 1. Porokeratosis   2. Tailor's bunion of both feet   3. Diabetic peripheral neuropathy associated with type 2 diabetes mellitus ([place])    Plan: -Examined patient.  -No new findings. No new orders.  -Discussed dangers of pedicures.  Despite counseling, she continues to get pedicures. -Continue diabetic foot care principles. -Painful porokeratotic lesion(s) submet head 2 right foot, submet head 3 right foot, submet head 5 left foot and submet head 5 right foot pared and enucleated with sterile scalpel blade without incident. -Patient to report any pedal injuries to medical professional immediately. -Patient to continue soft, supportive shoe gear daily. -Patient/POA to call should there be question/concern in the interim.  Return in about 3 months (around 09/03/2020) for diabetic nail and callus trim.  Marzetta Board, DPM

## 2020-06-06 ENCOUNTER — Telehealth: Payer: Self-pay | Admitting: Student

## 2020-06-06 NOTE — Telephone Encounter (Signed)
Pls contact pt referral podiatry referral [phone]

## 2020-06-06 NOTE — Telephone Encounter (Signed)
Spoke with the patient.  She wanted a referral for her back Pain.  Per Notes on 05/30/2020 pt to have a MRI and after those results Dr. Sherry Ruffing will f/u before a Referral is placed. Pt. Verbalized understanding.

## 2020-06-17 ENCOUNTER — Ambulatory Visit (HOSPITAL_COMMUNITY)
Admission: RE | Admit: 2020-06-17 | Discharge: 2020-06-17 | Disposition: A | Discharge status: Home / Self Care | Payer: Medicare Other | Source: Ambulatory Visit | Attending: Internal Medicine | Admitting: Internal Medicine

## 2020-06-17 DIAGNOSIS — G8929 Other chronic pain: Secondary | ICD-10-CM | POA: Diagnosis present

## 2020-06-17 DIAGNOSIS — M546 Pain in thoracic spine: Secondary | ICD-10-CM | POA: Diagnosis not present

## 2020-06-22 ENCOUNTER — Other Ambulatory Visit: Payer: Self-pay | Admitting: Internal Medicine

## 2020-06-22 MED ORDER — GABAPENTIN 300 MG PO CAPS: 300.0000 mg | ORAL_CAPSULE | Freq: Every day | ORAL | 5 refills | Status: DC

## 2020-06-22 NOTE — Progress Notes (Signed)
Contacted patient regarding MRI findings, discussed that there is not clear cause of her symptoms. Noted to have mild foraminal narrowing at T10-11 and T11-12, leftward disc protrusion and annular tear at T11-12 with mild left foraminal narrowing, and lateral disc bulging at T3-4, T4-5, T7-8, and T8-9 without significant stenosis. She reported that she continues to have the left upper abdominal pain and back pain. Discussed that cause of her symptoms are still unclear. Still seems neuropathic in nature, discussed that we can trial her on gabapentin to see if this helps. Also advised to make appointment in clinic to reassess her symptoms. Will start on gabapentin 300 mg daily.

## 2020-06-28 ENCOUNTER — Encounter: Payer: Medicare Other | Admitting: Student

## 2020-07-01 ENCOUNTER — Ambulatory Visit (INDEPENDENT_AMBULATORY_CARE_PROVIDER_SITE_OTHER): Payer: Medicare Other | Admitting: Student

## 2020-07-01 ENCOUNTER — Encounter: Payer: Self-pay | Admitting: Student

## 2020-07-01 ENCOUNTER — Other Ambulatory Visit: Payer: Self-pay

## 2020-07-01 VITALS — BP 130/71 | HR 89 | Temp 98.4°F | Ht 64.0 in | Wt 174.4 lb

## 2020-07-01 DIAGNOSIS — E785 Hyperlipidemia, unspecified: Secondary | ICD-10-CM | POA: Diagnosis present

## 2020-07-01 DIAGNOSIS — Z23 Encounter for immunization: Secondary | ICD-10-CM | POA: Insufficient documentation

## 2020-07-01 DIAGNOSIS — R202 Paresthesia of skin: Secondary | ICD-10-CM

## 2020-07-01 HISTORY — DX: Encounter for immunization: Z23

## 2020-07-01 MED ORDER — CAMPHOR-MENTHOL 0.5-0.5 % EX LOTN: 1.0000 "application " | TOPICAL_LOTION | CUTANEOUS | 0 refills | Status: DC | PRN

## 2020-07-01 MED ORDER — GABAPENTIN 300 MG PO CAPS: 300.0000 mg | ORAL_CAPSULE | Freq: Every day | ORAL | 5 refills | Status: DC

## 2020-07-01 NOTE — Assessment & Plan Note (Addendum)
Patient experienced fall in [place] where she landed on her back approximately three months ago. Since the fall, she reports ongoing waxing and waning burning pain and itchiness of the left upper back area. She has tried using topical lidocaine and gabapentin without resolution of her symptoms. She has received an MRI of her thoracic spine which was insiginifacnt for explaining her symptoms, however, lateral disc bulging was present at the T3-T4 and T4-T5 discs. Physical examination reveals a hyperpigmented patch over the left mid-upper back.  Assessment: Her symptoms of pruritus, tenderness, burning pain and hyperalgesia in a unilateral distribution of her mid-upper back is consistent with Notalgia Paresthetica. This sensory neuropathy is likely secondary to her fall and mild disease of her spine. Multiple options are available for treatment of this condition including: -topical cooling lotions or creams such as camphor and menthol -topical steroids to treat associated lichen simplex -Capsaicin cream -Local anesthetic creams -Amitriptyline -Gabapentin or pregabalin -Transcutaneous electrical nerve stimulation (TENS) -Surgical decompression of vertebral nerve impingement -Physical therapy with repetitive exercises and stretches for the upper back  Plan: Discussed options for treatment with patient, she is interested in starting physical therapy with TENS, gabapentin and camphor-menthol.

## 2020-07-01 NOTE — Assessment & Plan Note (Signed)
Patient refused despite counseling.

## 2020-07-01 NOTE — Patient Instructions (Addendum)
Dear Ms. Console,  It was a pleasure meeting you in clinic today. The condition that you have is known as notalgia paresthetica. The best treatments for this condition include topical medications, gabapentin as well as physical therapy.  I have placed a referral for physical therapy, they will call you to schedule an appointment. Otherwise, please apply the topical medication at least twice daily. Also, taking gabapentin will help with your symptoms as well. I have included some exercises below that you can consider doing at home for the meantime.  You can try the following exercises at home for your symptoms: While sitting, cross arms and bend forward to stretch the upper back. Arms at sides, raise shoulders and rotate them forwards and backwards. Arms straight, rotate forwards 360 degrees and backwards 360 degrees. Rotate upper body left and right until a stretch is felt and hold. Massage the muscles besides the spine in the affected area.  Sincerely, Dr. Fausto Skillern, MD

## 2020-07-01 NOTE — Assessment & Plan Note (Signed)
>>  ASSESSMENT AND PLAN FOR NOTALGIA PARESTHETICA WRITTEN ON 07/01/2020  3:19 PM BY Roylene Reason, MD  Patient experienced fall in [place] where she landed on her back approximately three months ago. Since the fall, she reports ongoing waxing and waning burning pain and itchiness of the left upper back area. She has tried using topical lidocaine and gabapentin without resolution of her symptoms. She has received an MRI of her thoracic spine which was insiginifacnt for explaining her symptoms, however, lateral disc bulging was present at the T3-T4 and T4-T5 discs. Physical examination reveals a hyperpigmented patch over the left mid-upper back.  Assessment: Her symptoms of pruritus, tenderness, burning pain and hyperalgesia in a unilateral distribution of her mid-upper back is consistent with Notalgia Paresthetica. This sensory neuropathy is likely secondary to her fall and mild disease of her spine. Multiple options are available for treatment of this condition including: -topical cooling lotions or creams such as camphor and menthol -topical steroids to treat associated lichen simplex -Capsaicin cream -Local anesthetic creams -Amitriptyline -Gabapentin or pregabalin -Transcutaneous electrical nerve stimulation (TENS) -Surgical decompression of vertebral nerve impingement -Physical therapy with repetitive exercises and stretches for the upper back  Plan: Discussed options for treatment with patient, she is interested in starting physical therapy with TENS, gabapentin and camphor-menthol.

## 2020-07-01 NOTE — Progress Notes (Signed)
   CC: Back itching, burning, pain  HPI:  [name] is a 64 y.o. with past medical history as below who presents to clinic for evaluation of back itching. Refer to problem list for Assessment/Plan based charting of this encounter.  Past Medical History:  Diagnosis Date  . Adenomatous colon polyp 6/09   Resected on colonoscopy, no high grade dysplasia.   . Allergic rhinitis   . Bell's palsy 01/2009   Left sided  . Chest pain    Exercise stress test negative 6/07  . Colon polyps    03/22/2008: adenomatous polyps x3 w no dysplasia. Needs repeat colonoscopy in 3 years  . Diabetic peripheral neuropathy ([place])   . External hemorrhoid   . GERD (gastroesophageal reflux disease)   . Hyperlipidemia   . Hypertension   . Hypertensive retinopathy    Followed by Dr. Herbert Owens.  . Insomnia   . Intolerance, drug    Leg cramps on Lipitor  . Lipoma 12/11   Posterior neck.   . Osteoarthritis    Carpometacarpal joint of right thumb  . Postmenopausal bleeding 9/05   Endometrial biopsy showed  FOCAL TUBAL METAPLASIA   . Postmenopausal symptoms    Hot flashes, vaginal dryness, iritability, difficulty sleeping. as of 2005.  Marland Kitchen Sebaceous cyst of breast    Has been refered to derm.  . Trigger finger    Right thumb  . Type II diabetes mellitus ([place])   . Uterine fibroid    Review of Systems:  Endorses burning pain, pruritus of left upper back.  Physical Exam:  There were no vitals filed for this visit. Physical Exam Constitutional:      Appearance: Normal appearance.  HENT:     Head: Normocephalic and atraumatic.  Eyes:     Extraocular Movements: Extraocular movements intact.     Conjunctiva/sclera: Conjunctivae normal.  Cardiovascular:     Rate and Rhythm: Normal rate and regular rhythm.     Pulses: Normal pulses.     Heart sounds: Normal heart sounds.  Pulmonary:     Effort: Pulmonary effort is normal.     Breath sounds: Normal breath sounds.  Abdominal:     General: Abdomen is  flat. Bowel sounds are normal.     Palpations: Abdomen is soft.  Musculoskeletal:        General: Normal range of motion.     Cervical back: Normal range of motion and neck supple.  Skin:    General: Skin is warm and dry.     Capillary Refill: Capillary refill takes less than 2 seconds.     Comments: Hyperpigmented patch over left mid-upper back  Neurological:     General: No focal deficit present.     Mental Status: She is alert. Mental status is at baseline.  Psychiatric:        Mood and Affect: Mood normal.        Behavior: Behavior normal.        Thought Content: Thought content normal.        Judgment: Judgment normal.    Assessment & Plan:   See Encounters Tab for problem based charting.  Patient seen with Dr. Dareen Piano

## 2020-07-02 LAB — LIPID PANEL
Chol/HDL Ratio: 4.7 ratio — ABNORMAL HIGH (ref 0.0–4.4)
Cholesterol, Total: 225 mg/dL — ABNORMAL HIGH (ref 100–199)
HDL: 48 mg/dL (ref >39)
LDL Chol Calc (NIH): 154 mg/dL — ABNORMAL HIGH (ref 0–99)
Triglycerides: 128 mg/dL (ref 0–149)
VLDL Cholesterol Cal: 23 mg/dL (ref 5–40)

## 2020-07-04 ENCOUNTER — Other Ambulatory Visit: Payer: Self-pay | Admitting: Student

## 2020-07-04 DIAGNOSIS — E785 Hyperlipidemia, unspecified: Secondary | ICD-10-CM

## 2020-07-04 MED ORDER — ATORVASTATIN CALCIUM 40 MG PO TABS: ORAL_TABLET | ORAL | 3 refills | Status: DC

## 2020-07-04 NOTE — Assessment & Plan Note (Signed)
Patient reports that she stopped taking atorvastatin due to her prescription expiring about a year ago. She has not had a lipid profile checked in approximately two years. -Lipid profile today  ADDENDUM: Lipid Panel     Component Value Date/Time   CHOL 225 (H) 07/01/2020 1523   TRIG 128 07/01/2020 1523   HDL 48 07/01/2020 1523   CHOLHDL 4.7 (H) 07/01/2020 1523   CHOLHDL 4.1 01/31/2015 1435   VLDL 27 01/31/2015 1435   LDLCALC 154 (H) 07/01/2020 1523   LABVLDL 23 07/01/2020 1523  Due to patient's HLD, she would benefit from restarting her previous lipid-lowering medication regimen with lipitor 40mg  daily

## 2020-07-04 NOTE — Progress Notes (Signed)
Internal Medicine Clinic Attending  I saw and evaluated the patient.  I personally confirmed the key portions of the history and exam documented by Dr. Johnson and I reviewed pertinent patient test results.  The assessment, diagnosis, and plan were formulated together and I agree with the documentation in the resident's note.  

## 2020-07-04 NOTE — Addendum Note (Signed)
Addended by: Aldine Contes on: 07/04/2020 11:15 AM   Modules accepted: Level of Service

## 2020-07-11 ENCOUNTER — Ambulatory Visit: Payer: Medicare Other | Admitting: Podiatry

## 2020-07-26 ENCOUNTER — Ambulatory Visit: Payer: Medicare Other | Admitting: Physical Therapy

## 2020-08-01 ENCOUNTER — Other Ambulatory Visit: Payer: Self-pay | Admitting: *Deleted

## 2020-08-01 DIAGNOSIS — I10 Essential (primary) hypertension: Secondary | ICD-10-CM

## 2020-08-02 MED ORDER — HYDROCHLOROTHIAZIDE 25 MG PO TABS: 25.0000 mg | ORAL_TABLET | Freq: Every day | ORAL | 1 refills | Status: DC

## 2020-08-04 ENCOUNTER — Ambulatory Visit: Payer: Medicare Other | Attending: Internal Medicine

## 2020-08-18 ENCOUNTER — Ambulatory Visit: Payer: Medicare Other | Attending: Internal Medicine

## 2020-08-22 NOTE — Addendum Note (Signed)
Addended by: Hulan Fray on: 08/22/2020 08:12 PM   Modules accepted: Orders

## 2020-09-14 ENCOUNTER — Ambulatory Visit: Payer: Medicare Other | Admitting: Podiatry

## 2020-09-21 ENCOUNTER — Encounter: Payer: Medicare Other | Admitting: Internal Medicine

## 2020-09-26 ENCOUNTER — Ambulatory Visit (HOSPITAL_COMMUNITY)
Admission: RE | Admit: 2020-09-26 | Discharge: 2020-09-26 | Disposition: A | Discharge status: Home / Self Care | Payer: Medicare Other | Source: Ambulatory Visit | Attending: Student in an Organized Health Care Education/Training Program | Admitting: Student in an Organized Health Care Education/Training Program

## 2020-09-26 ENCOUNTER — Encounter: Payer: Self-pay | Admitting: Internal Medicine

## 2020-09-26 ENCOUNTER — Ambulatory Visit (INDEPENDENT_AMBULATORY_CARE_PROVIDER_SITE_OTHER): Payer: Medicare Other | Admitting: Internal Medicine

## 2020-09-26 ENCOUNTER — Other Ambulatory Visit: Payer: Self-pay

## 2020-09-26 VITALS — BP 144/77 | HR 80 | Temp 98.4°F | Wt 174.7 lb

## 2020-09-26 DIAGNOSIS — E118 Type 2 diabetes mellitus with unspecified complications: Secondary | ICD-10-CM | POA: Diagnosis not present

## 2020-09-26 DIAGNOSIS — R0789 Other chest pain: Secondary | ICD-10-CM | POA: Diagnosis present

## 2020-09-26 DIAGNOSIS — R0781 Pleurodynia: Secondary | ICD-10-CM

## 2020-09-26 DIAGNOSIS — N644 Mastodynia: Secondary | ICD-10-CM | POA: Insufficient documentation

## 2020-09-26 DIAGNOSIS — I10 Essential (primary) hypertension: Secondary | ICD-10-CM

## 2020-09-26 MED ORDER — NAPROXEN 500 MG PO TABS: 500.0000 mg | ORAL_TABLET | Freq: Two times a day (BID) | ORAL | 0 refills | Status: DC

## 2020-09-26 MED ORDER — BACLOFEN 10 MG PO TABS: 10.0000 mg | ORAL_TABLET | Freq: Three times a day (TID) | ORAL | 0 refills | Status: DC | PRN

## 2020-09-26 MED ORDER — DICLOFENAC SODIUM 1 % EX GEL: 4.0000 g | Freq: Four times a day (QID) | CUTANEOUS | 0 refills | Status: DC

## 2020-09-26 NOTE — Progress Notes (Signed)
   CC: left breast pain  HPI:  Bridget Owens is a 64 y.o. with PMH as below.   Please see A&P for assessment of the patient's acute and chronic medical conditions.   She has been having left breast pain the last several months, prior to this she had pain in the left side of her back that wrapped around to the front, which all started after she fell in [place] in June. Thoracic xr at the time showed no spinal injury. In August, the pain had begun to move to the anterior chest with tenderness. Thoracic spine MRI was done which showed slight bulge over T3-T4 and T4-T5 without stenosis. At that time she also endorsed burning and itching which is not present today. 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  The pain is constant and does not worsen with exertion but when she moves certain ways. She is not able to wear a bra due to the pain. She describes it as sharp and sometimes wraps around to her side and back. Regular touch and clothing does not irritate the area but pressing down hurts. She denies shortness of breath, nausea, neck, jaw pain or arm pain or numbness. She states that she has always had nipple inversion bilaterally.   Past Medical History:  Diagnosis Date  . Adenomatous colon polyp 6/09   Resected on colonoscopy, no high grade dysplasia.   . Allergic rhinitis   . Bell's palsy 01/2009   Left sided  . Chest pain    Exercise stress test negative 6/07  . Colon polyps    03/22/2008: adenomatous polyps x3 w no dysplasia. Needs repeat colonoscopy in 3 years  . Diabetic peripheral neuropathy ([place])   . External hemorrhoid   . GERD (gastroesophageal reflux disease)   . Hyperlipidemia   . Hypertension   . Hypertensive retinopathy    Followed by Dr. Herbert Deaner.  . Insomnia   . Intolerance, drug    Leg cramps on Lipitor  . Lipoma 12/11   Posterior neck.   . Osteoarthritis    Carpometacarpal joint of right thumb  . Postmenopausal bleeding 9/05   Endometrial biopsy  showed  FOCAL TUBAL METAPLASIA   . Postmenopausal symptoms    Hot flashes, vaginal dryness, iritability, difficulty sleeping. as of 2005.  Marland Kitchen Sebaceous cyst of breast    Has been refered to derm.  . Trigger finger    Right thumb  . Type II diabetes mellitus ([place])   . Uterine fibroid    Review of Systems:   10 point ROS negative except as noted in HPI  Physical Exam: Constitution: mild distress, appears stated age 27: RRR, no m/r/g, no LE edema  Respiratory: clear to auscultation bilaterally  Abdominal: NTTP, soft, non-distended MSK:  Very TTP to lower left costal cartilage from the costal to sternal junction, no skin changes to chest or back; no breast mass or tenderness, no axillary lymphadenopathy; back is NTTP  Neuro: normal affect, a&ox3 Skin: c/d/i   Vitals:   09/26/20 1331  BP: (!) 144/77  Pulse: 80  Temp: 98.4 F (36.9 C)  TempSrc: Oral  SpO2: 100%  Weight: 174 lb 11.2 oz (79.2 kg)     Assessment & Plan:   See Encounters Tab for problem based charting.  Patient discussed with Dr. Angelia Mould

## 2020-09-26 NOTE — Assessment & Plan Note (Addendum)
She has been having left breast pain the last several months, prior to this she had pain in the left side of her back that wrapped around to the front, which all started after she fell in [place] in June. Thoracic xr at the time showed no spinal injury. In August, the pain had begun to move to the anterior chest with tenderness. Thoracic spine MRI was done which showed slight bulge over T3-T4 and T4-T5 without stenosis. At that time she also endorsed burning and itching which is not present today. 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  The pain is constant and does not worsen with exertion but when she moves certain ways. She is not able to wear a bra due to the pain. She describes it as sharp and sometimes wraps around to her side and back. Regular touch and clothing does not irritate the area but pressing down hurts. She denies shortness of breath, nausea, neck, jaw pain or arm pain or numbness. She states that she has always had nipple inversion bilaterally.  On PE there is no breast mass or lymphadenopathy. No skin changes. She is very TTP along the lower costochondral to costosternal junction. No sternal tenderness, no abdominal or breast tenderness, no tenderness to the thoracic region of her back or posterior ribs Overall symptoms consistent with chronic injury to the costal cartilage although prior diagnosis of notalgia paresthetica may be a component as well.   - no prior xr of the ribs, rib xr ordered.  - last mammogram 2015 - reordered screening mammogram  - refer to PM&R  - naproxen 500 mg bid prn - refill baclofen 10 mg tid prn for ten days

## 2020-09-26 NOTE — Patient Instructions (Addendum)
Thank you for allowing [place] to provide your care today. Today we discussed your left rib pain    I have ordered the following labs for you:  Basic metabolic panel, hemoglobin a1c   I will call if any are abnormal.    Today we made the following changes to your medications:   Please START taking  Naproxen 500 mg - take one tablet two times per day as needed for rib pain   Baclofen 10 mg - take one tablet three times per day as needed for rib pain  Please complete x-rays of the ribs.   I have placed a referral to physical medicine and rehabilitation. They will call you for an appointment.   Please follow-up in two weeks for blood pressure check and hemoglobin a1c.   Please call the internal medicine center clinic if you have any questions or concerns, we may be able to help and keep you from a long and expensive emergency room wait. Our clinic and after hours phone number is [phone], the best time to call is Monday through Friday 9 am to 4 pm but there is always someone available 24/7 if you have an emergency. If you need medication refills please notify your pharmacy one week in advance and they will send [place] a request.

## 2020-09-27 NOTE — Progress Notes (Signed)
Internal Medicine Clinic Attending  Case discussed with Dr. Seawell  At the time of the visit.  We reviewed the resident's history and exam and pertinent patient test results.  I agree with the assessment, diagnosis, and plan of care documented in the resident's note.  

## 2020-09-28 ENCOUNTER — Encounter: Payer: Self-pay | Admitting: Internal Medicine

## 2020-09-30 ENCOUNTER — Other Ambulatory Visit: Payer: Self-pay | Admitting: Internal Medicine

## 2020-09-30 DIAGNOSIS — R0789 Other chest pain: Secondary | ICD-10-CM

## 2020-10-03 ENCOUNTER — Other Ambulatory Visit: Payer: Self-pay | Admitting: Internal Medicine

## 2020-10-03 DIAGNOSIS — R0789 Other chest pain: Secondary | ICD-10-CM

## 2020-10-03 DIAGNOSIS — E1142 Type 2 diabetes mellitus with diabetic polyneuropathy: Secondary | ICD-10-CM

## 2020-10-03 MED ORDER — OZEMPIC (0.25 OR 0.5 MG/DOSE) 2 MG/1.5ML ~~LOC~~ SOPN: 1.0000 mg | PEN_INJECTOR | SUBCUTANEOUS | 1 refills | Status: DC

## 2020-10-03 NOTE — Telephone Encounter (Signed)
Per pt-she did not request baclofen refill, but is requesting ozempic refill .Kingsley Spittle Cassady12/27/20214:42 PM

## 2020-10-03 NOTE — Telephone Encounter (Signed)
This was just refilled.  Please confirm with patient that she picked it up.  If she went through 30 tab in 7 days, then she needs a telehealth visit please, before further refills.   Thanks!

## 2020-10-05 ENCOUNTER — Telehealth: Payer: Self-pay

## 2020-10-05 ENCOUNTER — Other Ambulatory Visit: Payer: Self-pay | Admitting: Internal Medicine

## 2020-10-05 DIAGNOSIS — E118 Type 2 diabetes mellitus with unspecified complications: Secondary | ICD-10-CM

## 2020-10-05 LAB — HM DIABETES EYE EXAM

## 2020-10-05 MED ORDER — SEMAGLUTIDE (1 MG/DOSE) 4 MG/3ML ~~LOC~~ SOPN: 1.0000 mg | PEN_INJECTOR | SUBCUTANEOUS | 1 refills | Status: DC

## 2020-10-05 NOTE — Telephone Encounter (Signed)
Placed order for updated pen so that patient can inject once weekly.

## 2020-10-05 NOTE — Telephone Encounter (Signed)
Bridget Owens with [facility] pharmacy requesting to speak with a nurse about direction on Semaglutide,0.25 or 0.5MG /DOS, (OZEMPIC, 0.25 OR 0.5 MG/DOSE,) 2 MG/1.5ML SOPN. Please call back.

## 2020-10-05 NOTE — Telephone Encounter (Signed)
Pharmacy states that at current dose and instructions Semaglutide,0.25 or 0.5MG /DOS, (OZEMPIC, 0.25 OR 0.5 MG/DOSE,) 2 MG/1.5ML SOPN Inject 1 mg into the skin once a week, pt will have to inject twice a week. Does pen strength needs to be changed?  Pt has upcoming appt on 10/11/2019-CMA attempted to call pt to confirm how she was taking-no answer, unable to leave message.   Will send to appropriate team for review and/or address at pt's appt on Monday 01/03

## 2020-10-07 ENCOUNTER — Other Ambulatory Visit: Payer: Self-pay | Admitting: Internal Medicine

## 2020-10-07 DIAGNOSIS — R0789 Other chest pain: Secondary | ICD-10-CM

## 2020-10-10 ENCOUNTER — Ambulatory Visit (INDEPENDENT_AMBULATORY_CARE_PROVIDER_SITE_OTHER): Payer: Medicare Other | Admitting: Student

## 2020-10-10 ENCOUNTER — Encounter: Payer: Self-pay | Admitting: Student

## 2020-10-10 ENCOUNTER — Encounter: Payer: Self-pay | Admitting: *Deleted

## 2020-10-10 ENCOUNTER — Other Ambulatory Visit: Payer: Self-pay

## 2020-10-10 ENCOUNTER — Telehealth: Payer: Self-pay | Admitting: *Deleted

## 2020-10-10 VITALS — BP 138/74 | HR 61 | Temp 98.4°F | Ht 64.0 in | Wt 175.9 lb

## 2020-10-10 DIAGNOSIS — R0781 Pleurodynia: Secondary | ICD-10-CM

## 2020-10-10 DIAGNOSIS — N6322 Unspecified lump in the left breast, upper inner quadrant: Secondary | ICD-10-CM | POA: Diagnosis not present

## 2020-10-10 DIAGNOSIS — E1142 Type 2 diabetes mellitus with diabetic polyneuropathy: Secondary | ICD-10-CM

## 2020-10-10 DIAGNOSIS — N6324 Unspecified lump in the left breast, lower inner quadrant: Secondary | ICD-10-CM

## 2020-10-10 DIAGNOSIS — E118 Type 2 diabetes mellitus with unspecified complications: Secondary | ICD-10-CM

## 2020-10-10 DIAGNOSIS — I10 Essential (primary) hypertension: Secondary | ICD-10-CM

## 2020-10-10 DIAGNOSIS — N6320 Unspecified lump in the left breast, unspecified quadrant: Secondary | ICD-10-CM

## 2020-10-10 LAB — POCT GLYCOSYLATED HEMOGLOBIN (HGB A1C): Hemoglobin A1C: 6.4 % — AB (ref 4.0–5.6)

## 2020-10-10 LAB — GLUCOSE, CAPILLARY: Glucose-Capillary: 165 mg/dL — ABNORMAL HIGH (ref 70–99)

## 2020-10-10 MED ORDER — LIDOCAINE 5 % EX PTCH: 1.0000 | MEDICATED_PATCH | CUTANEOUS | 0 refills | Status: DC

## 2020-10-10 NOTE — Assessment & Plan Note (Signed)
Patient continues to endorse sharp pain in her left costal margin. States the cream prescribed has not help and she doesn't want to take any pain medications. Patient was informed that she was prescribed Naproxen and baclofen to help with her pain. There is no dermatomal rash to indicate herpes zoster. Xray of ribs negative for rib fractures. Patient now has a tender left breast mass that will be evaluated with a diagnostic mammogram. Will trial lidocaine patch to see if this improves her pain. --Start lidocaine patch --Continue Naproxen and baclofen as needed. --Follow up in 4 weeks.

## 2020-10-10 NOTE — Progress Notes (Signed)
Internal Medicine Clinic Attending  Case discussed with Dr. Kirke Corin  At the time of the visit.  We reviewed the resident's history and exam and pertinent patient test results.  I agree with the assessment, diagnosis, and plan of care documented in the resident's note.  Patient has had persistent left chest wall pain since a fall that happened several months ago.  She now has a new breast lump which has developed over the past few weeks in the same area. She feels this is unrelated to her prior pain. Regardless, we have ordered STAT diagnostic mammogram and ultrasound for further work up.

## 2020-10-10 NOTE — Addendum Note (Signed)
Addended by: Burnell Blanks on: 10/10/2020 01:49 PM   Modules accepted: Orders

## 2020-10-10 NOTE — Progress Notes (Signed)
   CC: BP, breast pain and diabetes follow up  HPI:  [name] is a 65 y.o. w/ PMH history as below who presents for a hypertension, breast pain and diabetics follow up. Please see problem based charting for evaluation, assessment and plan.  Past Medical History:  Diagnosis Date  . Adenomatous colon polyp 6/09   Resected on colonoscopy, no high grade dysplasia.   . Allergic rhinitis   . Bell's palsy 01/2009   Left sided  . Chest pain    Exercise stress test negative 6/07  . Colon polyps    03/22/2008: adenomatous polyps x3 w no dysplasia. Needs repeat colonoscopy in 3 years  . Diabetic peripheral neuropathy ([facility])   . External hemorrhoid   . GERD (gastroesophageal reflux disease)   . Hyperlipidemia   . Hypertension   . Hypertensive retinopathy    Followed by Dr. Elmer Owens.  . Insomnia   . Intolerance, drug    Leg cramps on Lipitor  . Lipoma 12/11   Posterior neck.   . Osteoarthritis    Carpometacarpal joint of right thumb  . Postmenopausal bleeding 9/05   Endometrial biopsy showed  FOCAL TUBAL METAPLASIA   . Postmenopausal symptoms    Hot flashes, vaginal dryness, iritability, difficulty sleeping. as of 2005.  Marland Kitchen Sebaceous cyst of breast    Has been refered to derm.  . Trigger finger    Right thumb  . Type II diabetes mellitus ([facility])   . Uterine fibroid    Review of Systems:  All ROS negative otherwise as stated in the HPI  Physical Exam:  General: Pleasant elderly woman. No acute distress. Well nourished, well developed. Head: Normocephalic, atraumatic w/o tenderness Cardiac: RRR. No murmurs, rubs or gallops. S1, S2. No lower extremities edema Breast: There is a 2-3 cm fluctuant mass in the upper inner quadrant of the left breast. The mass is moderately ttp. No nipple discharge or retraction. No rash or surrounding erythema. Mild ttp of the left costosternal angle.  Respiratory: Lungs CTAB. No wheezing or crackles. No increased WOB Abdominal: Soft, symmetric and  non tender. Normal bowel sounds Skin: Warm, dry and intact without rashes or lesions Extremities: Atraumatic. Full ROM. Pulse palpable.   Vitals:   10/10/20 1023  BP: 138/74  Pulse: 61  Temp: 98.4 F (36.9 C)  TempSrc: Oral  SpO2: 95%  Weight: 175 lb 14.4 oz (79.8 kg)  Height: 5\' 4"  (1.626 m)     Assessment & Plan:   See Encounters Tab for problem based charting.  Patient discussed with Dr. , MD, MPH

## 2020-10-10 NOTE — Assessment & Plan Note (Signed)
A1c in clinic of 6.4 is at goal. Non fasting CBG is 165. Patient is encouraged to continue lifestyle modification in addition to antidiabetic medications. --Continue Metformin 1000 mg twice daily --Continue on semaglutide 1 mg weekly --Repeat A1c check in 3 to 6 months

## 2020-10-10 NOTE — Assessment & Plan Note (Signed)
BP well controlled on current antihypertensive regimen.  BP of 138/74 today. Slightly increased due to left breast pain. Patient is encouraged to continue taking medications as prescribed. --Continue amlodipine 10 mg daily --Continue HCTZ 25 mg daily --BP check at next follow-up in 4 weeks

## 2020-10-10 NOTE — Patient Instructions (Signed)
Thank you, Bridget Owens for allowing [place] to provide your care today. Today we discussed your left breast pain, your diabetes and your blood pressure.  Blood pressure and diabetes are well controlled.  I have put in an order for diagnostic mammography to look at the swelling in your breast.  Please make sure to go to this appointment to have the study done.  I have also sent a prescription for a lidocaine patch to help with your pain.  I have ordered the following labs for you:  Lab Orders     Glucose, capillary     POC Hbg A1C   I will call if any are abnormal. All of your labs can be accessed through "My Chart".  I have ordered the following tests: Diagnostic mammogram  I have ordered the following medication/changed the following medications:  1. Lidocaine patch 5% patch: Apply to left chest near the tender area of your left breast  Please follow-up in 4 weeks.  Should you have any questions or concerns please call the internal medicine clinic at [phone].    Sharrell Ku, MD, MPH [facility]   My Chart Access: [email or web address]   If you have not already done so, please get your COVID 19 vaccine  To schedule an appointment for a COVID vaccine choice any of the following: Go to [email or web address]   Go to [email or web address]                  Call [phone]                                     Call [phone] and select Option 2

## 2020-10-10 NOTE — Assessment & Plan Note (Addendum)
Patient presented left breast pain and is not accompanied with swelling in her breast over the last few weeks. States she has had left chest wall and breast pain since she fell over the summer. She has tried a lidocaine gel with no improvement in pain. She has now noticed swelling in her left breast that is very tender to touch.  She denies any nipple discharge or recent injury to her breast. She denies any family history of malignancy. On exam, there is a 2 to 3 cm fluctuant mass in the UIQ of the left breast with no skin changes. The area is moderately tender to palpation. In this postmenopausal woman, this could likely represent a fibrocystic change vs. fibroadenoma vs. malignancy. Plan to evaluate with diagnostic mammography. --Stat diagnostic mammogram --Start lidocaine patch for pain  --F/u in 4 weeks

## 2020-10-10 NOTE — Telephone Encounter (Addendum)
Information for PA was sent through [facility] for Lidocaine 5% Patches.  Awaiting determination within 3-7 days.   [phone].  Valentina Gu 10/10/2020 3:35 PM. PA for Lidocaine 5% Patches was approved 10/08/2020 thru 10/07/2021 12:00 AM. PA [id number].   Angelina Ok, RN 10/11/2020 2:58 PM.

## 2020-10-14 ENCOUNTER — Other Ambulatory Visit: Payer: Self-pay | Admitting: Student

## 2020-10-14 DIAGNOSIS — R0789 Other chest pain: Secondary | ICD-10-CM

## 2020-10-21 ENCOUNTER — Other Ambulatory Visit: Payer: Self-pay

## 2020-10-21 ENCOUNTER — Other Ambulatory Visit: Payer: Self-pay | Admitting: Student

## 2020-10-21 DIAGNOSIS — N6324 Unspecified lump in the left breast, lower inner quadrant: Secondary | ICD-10-CM

## 2020-10-21 DIAGNOSIS — I10 Essential (primary) hypertension: Secondary | ICD-10-CM

## 2020-10-21 DIAGNOSIS — N6320 Unspecified lump in the left breast, unspecified quadrant: Secondary | ICD-10-CM

## 2020-10-21 MED ORDER — AMLODIPINE BESYLATE 10 MG PO TABS: 10.0000 mg | ORAL_TABLET | Freq: Every evening | ORAL | 11 refills | Status: DC

## 2020-10-21 NOTE — Telephone Encounter (Signed)
Need refill on blood pressure medicine ;pt contact [place] [place] [address] [place]

## 2020-10-21 NOTE — Progress Notes (Signed)
Patient's diagnostic mammogram and breast ultrasound could not be scheduled with [facility]. Sending this referral to the breast center.

## 2020-10-25 ENCOUNTER — Telehealth: Payer: Self-pay

## 2020-10-25 NOTE — Telephone Encounter (Signed)
Patient has been called and informed of lab results.

## 2020-10-25 NOTE — Telephone Encounter (Signed)
Requesting lab results, please call pt back.  

## 2020-11-08 ENCOUNTER — Telehealth: Payer: Self-pay

## 2020-11-08 NOTE — Telephone Encounter (Signed)
Agree with plan for moving up mammogram to earlier appointment. Thanks.

## 2020-11-08 NOTE — Telephone Encounter (Signed)
Pt stated she's having a lot of breast pain; stated this has been going on since March 19 2020. Her mammogram/ U/S appt scheduled 12/13/20. Told pt I will call to see if it can be moved up. I called [facility] - appt changed to 11/21/20 @ 1010 Am. I called pt back to inform her of new appt date/time.

## 2020-11-08 NOTE — Telephone Encounter (Signed)
Pt would like to someone to call her; she is having very bad breast pain [phone]

## 2020-11-21 ENCOUNTER — Other Ambulatory Visit: Payer: Medicare Other

## 2020-11-21 ENCOUNTER — Ambulatory Visit
Admission: RE | Admit: 2020-11-21 | Discharge: 2020-11-21 | Disposition: A | Discharge status: Home / Self Care | Payer: Medicare Other | Source: Ambulatory Visit | Attending: Internal Medicine | Admitting: Internal Medicine

## 2020-11-21 ENCOUNTER — Other Ambulatory Visit: Payer: Self-pay

## 2020-11-21 DIAGNOSIS — N6322 Unspecified lump in the left breast, upper inner quadrant: Secondary | ICD-10-CM

## 2020-12-02 ENCOUNTER — Encounter: Payer: Medicare Other | Admitting: Student

## 2020-12-05 ENCOUNTER — Ambulatory Visit: Payer: Medicare Other | Admitting: Student

## 2020-12-05 ENCOUNTER — Other Ambulatory Visit: Payer: Self-pay

## 2020-12-05 ENCOUNTER — Encounter: Payer: Self-pay | Admitting: Student

## 2020-12-05 ENCOUNTER — Ambulatory Visit (INDEPENDENT_AMBULATORY_CARE_PROVIDER_SITE_OTHER): Payer: Medicare Other | Admitting: Student

## 2020-12-05 DIAGNOSIS — N644 Mastodynia: Secondary | ICD-10-CM | POA: Diagnosis not present

## 2020-12-05 DIAGNOSIS — R202 Paresthesia of skin: Secondary | ICD-10-CM | POA: Diagnosis not present

## 2020-12-05 MED ORDER — DULOXETINE HCL 30 MG PO CPEP: 30.0000 mg | ORAL_CAPSULE | Freq: Every day | ORAL | 2 refills | Status: DC

## 2020-12-05 MED ORDER — GABAPENTIN 300 MG PO CAPS: 300.0000 mg | ORAL_CAPSULE | Freq: Two times a day (BID) | ORAL | 5 refills | Status: DC

## 2020-12-05 NOTE — Progress Notes (Signed)
CC: left breast burning pain  HPI:  [name] is a 65 y.o. female with history listed below presenting to the [facility] for left breast burning pain. Please see individualized problem based charting for full HPI.  Past Medical History:  Diagnosis Date  . Adenomatous colon polyp 6/09   Resected on colonoscopy, no high grade dysplasia.   . Allergic rhinitis   . Bell's palsy 01/2009   Left sided  . Chest pain    Exercise stress test negative 6/07  . Colon polyps    03/22/2008: adenomatous polyps x3 w no dysplasia. Needs repeat colonoscopy in 3 years  . Diabetic peripheral neuropathy ([place])   . External hemorrhoid   . GERD (gastroesophageal reflux disease)   . Hyperlipidemia   . Hypertension   . Hypertensive retinopathy    Followed by Dr. Herbert Deaner.  . Insomnia   . Intolerance, drug    Leg cramps on Lipitor  . Lipoma 12/11   Posterior neck.   . Osteoarthritis    Carpometacarpal joint of right thumb  . Postmenopausal bleeding 9/05   Endometrial biopsy showed  FOCAL TUBAL METAPLASIA   . Postmenopausal symptoms    Hot flashes, vaginal dryness, iritability, difficulty sleeping. as of 2005.  Marland Kitchen Sebaceous cyst of breast    Has been refered to derm.  . Trigger finger    Right thumb  . Type II diabetes mellitus ([place])   . Uterine fibroid     Review of Systems:  Negative aside from that listed in individualized problem based charting.  Physical Exam:  Vitals:   12/05/20 1554  BP: (!) 155/78  Pulse: 87  Temp: (!) 97.5 F (36.4 C)  TempSrc: Oral  SpO2: 99%  Weight: 171 lb 1.6 oz (77.6 kg)  Height: 5\' 4"  (1.626 m)   Physical Exam Constitutional:      Appearance: She is obese. She is not ill-appearing.  HENT:     Head: Normocephalic and atraumatic.     Nose: Nose normal.     Mouth/Throat:     Mouth: Mucous membranes are moist.     Pharynx: Oropharynx is clear.  Eyes:     Extraocular Movements: Extraocular movements intact.     Conjunctiva/sclera: Conjunctivae normal.      Pupils: Pupils are equal, round, and reactive to light.  Cardiovascular:     Rate and Rhythm: Normal rate and regular rhythm.     Pulses: Normal pulses.     Heart sounds: Normal heart sounds. No murmur heard. No friction rub. No gallop.   Pulmonary:     Effort: Pulmonary effort is normal.     Breath sounds: Normal breath sounds. No wheezing, rhonchi or rales.  Abdominal:     General: Bowel sounds are normal. There is no distension.     Palpations: Abdomen is soft.     Tenderness: There is no abdominal tenderness.  Musculoskeletal:        General: Normal range of motion.     Cervical back: Normal range of motion.  Skin:    General: Skin is warm and dry.  Neurological:     General: No focal deficit present.     Mental Status: She is alert and oriented to person, place, and time.  Psychiatric:        Mood and Affect: Mood normal.        Behavior: Behavior normal.      Assessment & Plan:   See Encounters Tab for problem based charting.  Patient  discussed with Dr. Dareen Piano

## 2020-12-05 NOTE — Patient Instructions (Signed)
Ms Reinheimer,  It was a pleasure seeing you in the clinic today.   We discussed the following today:  1. Please start taking your gabapentin twice daily.  2. I have prescribed a new medication called Duloxetine to help with your nerve pain.  3. I have sent both of these medications to your preferred pharmacy.  Please call our clinic at [phone] if you have any questions or concerns. The best time to call is Monday-Friday from 9am-4pm, but there is someone available 24/7 at the same number. If you need medication refills, please notify your pharmacy one week in advance and they will send [place] a request.   Thank you for letting us take part in your care. We look forward to seeing you next time!

## 2020-12-05 NOTE — Assessment & Plan Note (Signed)
Patient complaining of a shock-like burning pain that originates at her breast and travels to her left axilla. She has had this pain for months now, with no improvement in symptoms with lidocaine patches or voltaren gel. She does report some improvement with gabapentin, which she was taking at 300mg  qhs. Patient recently had a mammogram and left breast ultrasound earlier this month which were performed due to this breast pain, but neither showed any abnormalities. Patient does not have any herpetic/vesicular rash in the affected area to suggest shingles.  Patient's breast pain is localized in a  dermatomal (likely T5) region and is neuropathic in nature. This could likely be from an entrapped superficial nerve or from something more rare like zoster sine herpete (which is why there is no rash on exam). Regardless, due to the quality and location of pain, I will increase her gabapentin dosing to 300mg  BID and will add duloxetine 30mg  daily to see if these two medications in combination will provide relief for her neuropathic pain. Plan to f/u in 1 month to reassess patient's symptoms.  Plan: -increased gabapentin to 300mg  BID -start duloxetine 30mg  daily -f/u in 1 month

## 2020-12-08 NOTE — Progress Notes (Signed)
Internal Medicine Clinic Attending  Case discussed with Dr. Jinwala  At the time of the visit.  We reviewed the resident's history and exam and pertinent patient test results.  I agree with the assessment, diagnosis, and plan of care documented in the resident's note.  

## 2020-12-09 ENCOUNTER — Ambulatory Visit: Payer: Medicare Other | Admitting: Podiatry

## 2020-12-13 ENCOUNTER — Other Ambulatory Visit: Payer: Medicare Other

## 2020-12-14 ENCOUNTER — Other Ambulatory Visit: Payer: Self-pay

## 2020-12-14 ENCOUNTER — Encounter: Payer: Self-pay | Admitting: Podiatry

## 2020-12-14 ENCOUNTER — Ambulatory Visit: Payer: Medicare Other | Admitting: Podiatry

## 2020-12-14 ENCOUNTER — Ambulatory Visit (INDEPENDENT_AMBULATORY_CARE_PROVIDER_SITE_OTHER): Payer: Medicare Other | Admitting: Podiatry

## 2020-12-14 DIAGNOSIS — E1142 Type 2 diabetes mellitus with diabetic polyneuropathy: Secondary | ICD-10-CM | POA: Diagnosis not present

## 2020-12-14 DIAGNOSIS — Q828 Other specified congenital malformations of skin: Secondary | ICD-10-CM

## 2020-12-14 DIAGNOSIS — M21621 Bunionette of right foot: Secondary | ICD-10-CM

## 2020-12-14 DIAGNOSIS — M21622 Bunionette of left foot: Secondary | ICD-10-CM

## 2020-12-14 NOTE — Progress Notes (Signed)
Subjective: Bridget Owens presents today for follow up of preventative diabetic foot care and painful porokeratotic lesion(s) b/l feet and painful mycotic toenails b/l that limit ambulation. Aggravating factors include weightbearing with and without shoe gear. Pain for both is relieved with periodic professional debridement.  She relates her left foot lesion is most painful today.   Patient has thick, elongated, mycotic toenails b/l, but she does not want them cut.   She states blood glucose was 130 mg/dl on Sunday.    She has new PCP, Dr. Cato Mulligan, and last visit was 07/01/2020.  Allergies  Allergen Reactions  . Codeine Other (See Comments)    "felt funny"     Objective: There were no vitals filed for this visit.  Pt is a pleasant 65 y.o. year old African American female  WD, WN  in NAD. AAO x 3.   Vascular Examination:  Capillary fill time to digits <3 seconds b/l. Faintly palpable pedal pulses b/l. Pedal hair present b/l. Skin temperature gradient within normal limits b/l.  Dermatological Examination: Pedal skin with normal turgor, texture and tone bilaterally. No open wounds bilaterally. No interdigital macerations bilaterally. Porokeratotic lesion(s) submet head 2 right foot, submet head 3 right foot, submet head 5 left foot and submet head 5 right foot. No erythema, no edema, no drainage, no flocculence.  Toenails 1-5 b/l elongated and thickened with teal blue nail polish. No erythema, no edema, no draiang  Musculoskeletal: Normal muscle strength 5/5 to all lower extremity muscle groups bilaterally. No pain crepitus or joint limitation noted with ROM b/l. Tailor's bunion deformity noted b/l.  Neurological: Pt has subjective symptoms of neuropathy. Protective sensation decreased with 10 gram monofilament b/l. Vibratory sensation intact b/l.  Assessment: 1. Porokeratosis   2. Tailor's bunion of both feet   3. Diabetic peripheral neuropathy associated with type 2  diabetes mellitus ([place])    Plan: -Examined patient.  -No new findings. No new orders.  -Continue diabetic foot care principles. -Painful porokeratotic lesion(s) submet head 2 right foot, submet head 3 right foot, submet head 5 left foot and submet head 5 right foot pared and enucleated with sterile scalpel blade without incident. -Patient to report any pedal injuries to medical professional immediately. -Patient to continue soft, supportive shoe gear daily. -Patient/POA to call should there be question/concern in the interim.  Return in about 3 months (around 03/16/2021) for diabetic callus trim.  Marzetta Board, DPM

## 2021-01-03 ENCOUNTER — Telehealth: Payer: Self-pay

## 2021-01-03 DIAGNOSIS — J302 Other seasonal allergic rhinitis: Secondary | ICD-10-CM

## 2021-01-03 NOTE — Telephone Encounter (Signed)
Pt is requesting a call back she stated that her sinus are acting up from since the weekend

## 2021-01-03 NOTE — Telephone Encounter (Signed)
Called pt - person who answered the phone stated she went to the store. Informed I was returning her call and to call back in the morning to discuss sinus problem.

## 2021-01-04 NOTE — Telephone Encounter (Signed)
Pt c/o runny nose. I asked if she has any Zyrtec; states she's out. I will send refill request to her doctor.

## 2021-01-05 ENCOUNTER — Other Ambulatory Visit: Payer: Self-pay | Admitting: Internal Medicine

## 2021-01-05 DIAGNOSIS — J302 Other seasonal allergic rhinitis: Secondary | ICD-10-CM

## 2021-01-05 MED ORDER — CETIRIZINE HCL 5 MG PO TABS
5.0000 mg | ORAL_TABLET | Freq: Every day | ORAL | 5 refills | Status: DC
Start: 2021-01-05 — End: 2021-02-16

## 2021-01-05 NOTE — Telephone Encounter (Signed)
Medication refilled

## 2021-02-12 ENCOUNTER — Encounter: Payer: Self-pay | Admitting: *Deleted

## 2021-02-12 ENCOUNTER — Other Ambulatory Visit: Payer: Self-pay | Admitting: Internal Medicine

## 2021-02-12 DIAGNOSIS — E118 Type 2 diabetes mellitus with unspecified complications: Secondary | ICD-10-CM

## 2021-02-12 NOTE — Progress Notes (Signed)

## 2021-02-13 NOTE — Progress Notes (Signed)
Things That May Be Affecting Your Health:  Alcohol  Hearing loss  Pain    Depression  Home Safety  Sexual Health  x Diabetes  Lack of physical activity  Stress   Difficulty with daily activities  Loneliness  Tiredness   Drug use x Medicines x Tobacco use   Falls  Motor Vehicle Safety  Weight   Food choices  Oral Health  Other    YOUR PERSONALIZED HEALTH PLAN : 1. Schedule your next subsequent Medicare Wellness visit in one year 2. Attend all of your regular appointments to address your medical issues 3. Complete the preventative screenings and services   Annual Wellness Visit   Medicare Covered Preventative Screenings and [place] Men and Women Who How Often Need? Date of Last Service Action  Abdominal Aortic Aneurysm Adults with AAA risk factors Once      Alcohol Misuse and Counseling All Adults Screening once a year if no alcohol misuse. Counseling up to 4 face to face sessions.     Bone Density Measurement  Adults at risk for osteoporosis Once every 2 yrs x     Lipid Panel Z13.6 All adults without CV disease Once every 5 yrs       Colorectal Cancer   Stool sample or  Colonoscopy All adults 51 and older   Once every year  Every 10 years        Depression All Adults Once a year  Today   Diabetes Screening Blood glucose, post glucose load, or GTT Z13.1  All adults at risk  Pre-diabetics  Once per year  Twice per year      Diabetes  Self-Management Training All adults Diabetics 10 hrs first year; 2 hours subsequent years. Requires Copay     Glaucoma  Diabetics  Family history of glaucoma  African Americans 82 yrs +  Hispanic Americans 18 yrs + Annually - requires coppay      Hepatitis C Z72.89 or F19.20  High Risk for HCV  Born between 1945 and 1965  Annually  Once      HIV Z11.4 All adults based on risk  Annually btw ages 77 & 52 regardless of risk  Annually > 65 yrs if at increased risk      Lung Cancer Screening  Asymptomatic adults aged 40-77 with 30 pack yr history and current smoker OR quit within the last 15 yrs Annually Must have counseling and shared decision making documentation before first screen      Medical Nutrition Therapy Adults with   Diabetes  Renal disease  Kidney transplant within past 3 yrs 3 hours first year; 2 hours subsequent years     Obesity and Counseling All adults Screening once a year Counseling if BMI 30 or higher  Today   Tobacco Use Counseling Adults who use tobacco  Up to 8 visits in one year x    Vaccines Z23  Hepatitis B  Influenza   Pneumonia  Adults   Once  Once every flu season  Two different vaccines separated by one year     Next Annual Wellness Visit People with Medicare Every year  Today     Services & Screenings Women Who How Often Need  Date of Last Service Action  Mammogram  Z12.31 Women over 25 One baseline ages 50-39. Annually ager 40 yrs+      Pap tests All women Annually if high risk. Every 2 yrs for normal risk women  Screening for cervical cancer with   Pap (Z01.419 nl or Z01.411abnl) &  HPV Z11.51 Women aged 19 to 23 Once every 5 yrs     Screening pelvic and breast exams All women Annually if high risk. Every 2 yrs for normal risk women     Sexually Transmitted Diseases  Chlamydia  Gonorrhea  Syphilis All at risk adults Annually for non pregnant females at increased risk         [place] Men Who How Ofter Need  Date of Last Service Action  Prostate Cancer - DRE & PSA Men over 50 Annually.  DRE might require a copay.        Sexually Transmitted Diseases  Syphilis All at risk adults Annually for men at increased risk      Health Maintenance List Health Maintenance  Topic Date Due  . COVID-19 Vaccine (1) Never done  . DEXA SCAN  01/10/2019  . FOOT EXAM  11/08/2020  . HEMOGLOBIN A1C  01/08/2021  . TETANUS/TDAP  09/26/2021 (Originally 05/19/2019)  . PNA vac Low Risk Adult (1 of 2 - PCV13)  09/26/2021 (Originally 01/10/2019)  . INFLUENZA VACCINE  01/25/2022 (Originally 05/08/2021)  . URINE MICROALBUMIN  05/30/2021  . LIPID PANEL  07/01/2021  . OPHTHALMOLOGY EXAM  10/05/2021  . COLONOSCOPY (Pts 45-26yrs Insurance coverage will need to be confirmed)  08/28/2022  . MAMMOGRAM  11/21/2022  . Hepatitis C Screening  Completed  . HPV VACCINES  Aged Out

## 2021-02-16 ENCOUNTER — Telehealth: Payer: Self-pay | Admitting: *Deleted

## 2021-02-16 ENCOUNTER — Encounter: Payer: Self-pay | Admitting: Student

## 2021-02-16 ENCOUNTER — Ambulatory Visit (INDEPENDENT_AMBULATORY_CARE_PROVIDER_SITE_OTHER): Payer: Medicare Other | Admitting: Student

## 2021-02-16 VITALS — BP 153/80 | HR 95 | Temp 98.9°F | Ht 64.0 in | Wt 168.9 lb

## 2021-02-16 DIAGNOSIS — J329 Chronic sinusitis, unspecified: Secondary | ICD-10-CM

## 2021-02-16 DIAGNOSIS — J302 Other seasonal allergic rhinitis: Secondary | ICD-10-CM | POA: Diagnosis not present

## 2021-02-16 DIAGNOSIS — R059 Cough, unspecified: Secondary | ICD-10-CM

## 2021-02-16 DIAGNOSIS — B9689 Other specified bacterial agents as the cause of diseases classified elsewhere: Secondary | ICD-10-CM | POA: Diagnosis not present

## 2021-02-16 MED ORDER — CETIRIZINE HCL 5 MG PO TABS: 5.0000 mg | ORAL_TABLET | Freq: Every day | ORAL | 5 refills | Status: DC

## 2021-02-16 MED ORDER — AMOXICILLIN-POT CLAVULANATE 500-125 MG PO TABS: 1.0000 | ORAL_TABLET | Freq: Two times a day (BID) | ORAL | 0 refills | Status: DC

## 2021-02-16 MED ORDER — NETI POT SINUS WASH 2300-700 MG NA KIT: PACK | NASAL | 0 refills | Status: AC

## 2021-02-16 MED ORDER — AMOXICILLIN-POT CLAVULANATE 500-125 MG PO TABS: 1.0000 | ORAL_TABLET | Freq: Three times a day (TID) | ORAL | 0 refills | Status: DC

## 2021-02-16 NOTE — Assessment & Plan Note (Signed)
Patient presenting with a chief complaint of congestion, cough, sinus pain, right ear pain, chills starting 3 weeks ago.  Is been gradually worsening.  She has had green sinus drainage.  States she has had similar symptoms before which resolved with antibiotics.  Also reports some seasonal allergies.  She has been using Mucinex with some improvement.  Denies chills, chest pain, vision changes, sore throat, dizziness.  Has had fair oral intake.  On exam has bulging of right TM without erythema.  Otherwise unremarkable, she appears mildly ill.  No known sick contacts or COVID exposure.  Patient is not vaccinated.  - Check COVID test - Patient encouraged to receive COVID-vaccine - Start Augmentin 500 twice daily for 7 days - Start Zyrtec - Start Nettie pot - Continue Mucinex

## 2021-02-16 NOTE — Telephone Encounter (Signed)
Patient called in stating she cannot wait to be seen till 5/19. C/o ear pain and green nasal drainage. Appt given today with [facility] Team as [employer] is full. She is very Patent attorney.

## 2021-02-16 NOTE — Telephone Encounter (Signed)
Agree, I will see her. Thank you

## 2021-02-16 NOTE — Patient Instructions (Signed)
Thank you for allowing [place] to be a part of your care today, it was a pleasure seeing you. We discussed your sinus infection  I have made these changes to your medications: START augmentin, twice daily for 7 days START Zyrtec daily START Netti pot twice daily CONTINUE mucinex  Please call us back if you are not getting better.   Thank you, and please call the Internal Medicine Clinic at [phone] if you have any questions.  Best, Dr. Bridgett Larsson

## 2021-02-16 NOTE — Progress Notes (Signed)
   CC: Sinus pressure, congestion, chills  HPI:  [name] is a 65 y.o. female with history as below presenting for the above symptoms going on for 3 weeks. Please refer to problem based charting for further details of assessment and plan of current problem and chronic medical conditions.  Past Medical History:  Diagnosis Date  . Adenomatous colon polyp 6/09   Resected on colonoscopy, no high grade dysplasia.   . Allergic rhinitis   . Bell's palsy 01/2009   Left sided  . Chest pain    Exercise stress test negative 6/07  . Colon polyps    03/22/2008: adenomatous polyps x3 w no dysplasia. Needs repeat colonoscopy in 3 years  . Diabetic peripheral neuropathy ([place])   . External hemorrhoid   . GERD (gastroesophageal reflux disease)   . Hyperlipidemia   . Hypertension   . Hypertensive retinopathy    Followed by Dr. Herbert Deaner.  . Insomnia   . Intolerance, drug    Leg cramps on Lipitor  . Lipoma 12/11   Posterior neck.   . Osteoarthritis    Carpometacarpal joint of right thumb  . Postmenopausal bleeding 9/05   Endometrial biopsy showed  FOCAL TUBAL METAPLASIA   . Postmenopausal symptoms    Hot flashes, vaginal dryness, iritability, difficulty sleeping. as of 2005.  Marland Kitchen Sebaceous cyst of breast    Has been refered to derm.  . Trigger finger    Right thumb  . Type II diabetes mellitus ([place])   . Uterine fibroid    Review of Systems:   Review of Systems  Constitutional: Positive for chills and diaphoresis. Negative for fever.  HENT: Positive for congestion. Negative for sore throat.   Eyes: Negative for blurred vision and double vision.  Respiratory: Positive for cough. Negative for shortness of breath.   Cardiovascular: Negative for chest pain and claudication.  Skin: Negative for itching and rash.     Physical Exam: Vitals:   02/16/21 1544  BP: (!) 153/80  Pulse: 95  Temp: 98.9 F (37.2 C)  TempSrc: Oral  SpO2: 100%  Weight: 168 lb 14.4 oz (76.6 kg)  Height: 5'  4" (1.626 m)   Constitutional: no acute distress, appears mildly Head: atraumatic ENT: external ears normal, throat clear besides warts on posterior tongue, right TM is bulging with no erythema, left ear canal is normal Cardiovascular: regular rate and rhythm, normal heart sounds Pulmonary: effort normal, normal breath sounds bilaterally Abdominal: flat Skin: warm and dry Neurological: alert, no focal deficit Psychiatric: normal mood and affect  Assessment & Plan:   See Encounters Tab for problem based charting.  Patient discussed with Dr. Philipp Ovens

## 2021-02-18 LAB — NOVEL CORONAVIRUS, NAA: SARS-CoV-2, NAA: NOT DETECTED

## 2021-02-18 LAB — SARS-COV-2, NAA 2 DAY TAT

## 2021-02-20 NOTE — Progress Notes (Signed)
Internal Medicine Clinic Attending  Case discussed with Dr. Chen  At the time of the visit.  We reviewed the resident's history and exam and pertinent patient test results.  I agree with the assessment, diagnosis, and plan of care documented in the resident's note. 

## 2021-02-23 ENCOUNTER — Encounter: Payer: Medicare Other | Admitting: Internal Medicine

## 2021-03-01 ENCOUNTER — Telehealth: Payer: Self-pay | Admitting: Student

## 2021-03-01 DIAGNOSIS — N760 Acute vaginitis: Secondary | ICD-10-CM

## 2021-03-01 MED ORDER — FLUCONAZOLE 150 MG PO TABS: 150.0000 mg | ORAL_TABLET | Freq: Every day | ORAL | 0 refills | Status: DC

## 2021-03-01 NOTE — Assessment & Plan Note (Signed)
Patient with burning and itching after recent antibiotic prescription. States this is similar to prior yeast infections.  - start fluconazole, 150mg  on time - make appointment for follow up if not resolved

## 2021-03-01 NOTE — Telephone Encounter (Signed)
Pt reporting vaginal itching and burning.  Please call patient back.

## 2021-03-01 NOTE — Telephone Encounter (Signed)
Returned call to patient. States she was given antibiotic on 5/12 for sinus infection and now with 3 days of vaginal itching and burning. Denies vaginal discharge or burning with urination. States this happens every time she takes antibiotics and meant to ask Provider for "a pill" at 5/12 OV.

## 2021-03-01 NOTE — Telephone Encounter (Signed)
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

## 2021-03-01 NOTE — Telephone Encounter (Signed)
Hi Lauren, let her know I will write a script for yeast infection. If it does not get better, have her make an appointment.

## 2021-03-10 ENCOUNTER — Other Ambulatory Visit: Payer: Self-pay | Admitting: Student

## 2021-03-10 DIAGNOSIS — E118 Type 2 diabetes mellitus with unspecified complications: Secondary | ICD-10-CM

## 2021-03-12 ENCOUNTER — Encounter: Payer: Self-pay | Admitting: *Deleted

## 2021-03-13 ENCOUNTER — Encounter: Payer: Self-pay | Admitting: Student

## 2021-03-13 ENCOUNTER — Other Ambulatory Visit (HOSPITAL_COMMUNITY)
Admission: RE | Admit: 2021-03-13 | Discharge: 2021-03-13 | Disposition: A | Discharge status: Home / Self Care | Payer: Medicare Other | Source: Ambulatory Visit | Attending: Internal Medicine | Admitting: Internal Medicine

## 2021-03-13 ENCOUNTER — Other Ambulatory Visit: Payer: Self-pay

## 2021-03-13 ENCOUNTER — Ambulatory Visit (INDEPENDENT_AMBULATORY_CARE_PROVIDER_SITE_OTHER): Payer: Medicare Other | Admitting: Student

## 2021-03-13 VITALS — BP 125/65 | HR 81 | Temp 98.1°F | Wt 172.1 lb

## 2021-03-13 DIAGNOSIS — N898 Other specified noninflammatory disorders of vagina: Secondary | ICD-10-CM | POA: Diagnosis not present

## 2021-03-13 DIAGNOSIS — E7841 Elevated Lipoprotein(a): Secondary | ICD-10-CM | POA: Diagnosis not present

## 2021-03-13 DIAGNOSIS — N760 Acute vaginitis: Secondary | ICD-10-CM

## 2021-03-13 DIAGNOSIS — I10 Essential (primary) hypertension: Secondary | ICD-10-CM | POA: Diagnosis not present

## 2021-03-13 DIAGNOSIS — E118 Type 2 diabetes mellitus with unspecified complications: Secondary | ICD-10-CM

## 2021-03-13 DIAGNOSIS — E1142 Type 2 diabetes mellitus with diabetic polyneuropathy: Secondary | ICD-10-CM

## 2021-03-13 LAB — POCT GLYCOSYLATED HEMOGLOBIN (HGB A1C): Hemoglobin A1C: 6.3 % — AB (ref 4.0–5.6)

## 2021-03-13 LAB — GLUCOSE, CAPILLARY: Glucose-Capillary: 121 mg/dL — ABNORMAL HIGH (ref 70–99)

## 2021-03-13 NOTE — Assessment & Plan Note (Signed)
Patient's blood pressure 125/65 today, well controlled on current regimen of amlodipine and HCTZ daily to which she has been adherent. -Continue amlodipine 10mg  daily -Continue HCTZ 25mg  daily -Obtain BMP

## 2021-03-13 NOTE — Assessment & Plan Note (Signed)
Patient's last lipid profile approximately one year ago with LDL of 159 despite adherence to atorvastatin 40mg  daily. -Collect lipid profile today -Continue atorvastatin 40mg  daily for now with consideration to increase to 80mg  daily if needed

## 2021-03-13 NOTE — Patient Instructions (Addendum)
Ms. Laning,  It was a pleasure seeing you today.  For your vaginal discharge, you collected a vaginal swab which we will send to the lab. We will call you with the results of this test. Please let us know if you develop any new or worsening symptoms.  For your hypertension, we will collect a basic metabolic panel. Please continue taking your amlodipine and HCTZ every day, your blood pressure looks great.  For your diabetes, we will collect a hemoglobin A1c. Please continue taking the weekly injection Ozempic as well as your metformin twice daily.  For your cholesterol, we will check a lipid profile today to determine whether we need to increase the dose of your atorvastatin. I will call you with the results.  Sincerely, Dr. Paulla Dolly, MD

## 2021-03-13 NOTE — Progress Notes (Signed)
   CC: Routine follow-up, vaginal discharge  HPI:  [name] is a 65 y.o. with past medical history of HTN, T2DM, HLD and recent sinus infection complicated by suspected vulvovaginitis who presents to clinic today for evaluation. Refer to problem list for charting of this encounter.  Past Medical History:  Diagnosis Date  . Adenomatous colon polyp 6/09   Resected on colonoscopy, no high grade dysplasia.   . Allergic rhinitis   . Bell's palsy 01/2009   Left sided  . Chest pain    Exercise stress test negative 6/07  . Colon polyps    03/22/2008: adenomatous polyps x3 w no dysplasia. Needs repeat colonoscopy in 3 years  . Diabetic peripheral neuropathy ([place])   . External hemorrhoid   . GERD (gastroesophageal reflux disease)   . Hyperlipidemia   . Hypertension   . Hypertensive retinopathy    Followed by Dr. Herbert Deaner.  . Insomnia   . Intolerance, drug    Leg cramps on Lipitor  . Lipoma 12/11   Posterior neck.   . Osteoarthritis    Carpometacarpal joint of right thumb  . Postmenopausal bleeding 9/05   Endometrial biopsy showed  FOCAL TUBAL METAPLASIA   . Postmenopausal symptoms    Hot flashes, vaginal dryness, iritability, difficulty sleeping. as of 2005.  Marland Kitchen Sebaceous cyst of breast    Has been refered to derm.  . Trigger finger    Right thumb  . Type II diabetes mellitus ([place])   . Uterine fibroid    Review of Systems: Endorses white vaginal discharge. Denies vaginal burning, itching, fevers, chills, abdominal pain, nausea, vomiting.  Physical Exam:  Vitals:   03/13/21 1040  BP: 125/65  Pulse: 81  Temp: 98.1 F (36.7 C)  TempSrc: Oral  SpO2: 97%  Weight: 172 lb 1.6 oz (78.1 kg)   Physical Exam Constitutional:      General: She is not in acute distress.    Appearance: She is not ill-appearing.  Cardiovascular:     Rate and Rhythm: Normal rate and regular rhythm.     Pulses: Normal pulses.     Heart sounds: Normal heart sounds.  Pulmonary:     Effort:  Pulmonary effort is normal.     Breath sounds: Normal breath sounds.  Abdominal:     General: Abdomen is flat. Bowel sounds are normal.     Palpations: Abdomen is soft.     Tenderness: There is no abdominal tenderness.  Genitourinary:    Comments: Patient declined vaginal examination Skin:    Capillary Refill: Capillary refill takes less than 2 seconds.  Neurological:     General: No focal deficit present.     Mental Status: She is alert. Mental status is at baseline.  Psychiatric:        Mood and Affect: Mood normal.        Behavior: Behavior normal.        Thought Content: Thought content normal.        Judgment: Judgment normal.      Assessment & Plan:   See Encounters Tab for problem based charting.  Patient discussed with Dr. Jimmye Norman

## 2021-03-13 NOTE — Assessment & Plan Note (Signed)
Patient's hemoglobin A1c was 6.4 in January. She has been checking her blood sugars regularly at home. She has been adherent to metformin 1,000mg  twice daily and ozempic 1mg  weekly.  -Obtain hemoglobin A1c -Continue metformin 1,000mg  twice daily -Continue semaglutide 1mg  weekly

## 2021-03-13 NOTE — Assessment & Plan Note (Signed)
Patient reports that her symptoms of vaginal burning and itching have improved following fluconazole treatment on 03/01/21. Patient does report mild, ongoing white vaginal discharge since receiving antibiotic. Patient inquired about whether she needs a pap smear today. Offered to perform pelvic examination for patient which she adamantly refused. Discussed with patient that as she is 65 years old, she does not require screening pap smear at this time. Offered to patient a vaginal swab to check for BV and candida to which she was agreeable. -Obtain vaginal swab for BV and candida

## 2021-03-14 ENCOUNTER — Telehealth: Payer: Self-pay

## 2021-03-14 LAB — CERVICOVAGINAL ANCILLARY ONLY
Bacterial Vaginitis (gardnerella): NEGATIVE
Candida Glabrata: NEGATIVE
Candida Vaginitis: NEGATIVE
Comment: NEGATIVE
Comment: NEGATIVE
Comment: NEGATIVE

## 2021-03-14 LAB — LIPID PANEL
Chol/HDL Ratio: 3.3 ratio (ref 0.0–4.4)
Cholesterol, Total: 152 mg/dL (ref 100–199)
HDL: 46 mg/dL (ref >39)
LDL Chol Calc (NIH): 90 mg/dL (ref 0–99)
Triglycerides: 83 mg/dL (ref 0–149)
VLDL Cholesterol Cal: 16 mg/dL (ref 5–40)

## 2021-03-14 LAB — CBC
Hematocrit: 38.7 % (ref 34.0–46.6)
Hemoglobin: 12.5 g/dL (ref 11.1–15.9)
MCH: 28.7 pg (ref 26.6–33.0)
MCHC: 32.3 g/dL (ref 31.5–35.7)
MCV: 89 fL (ref 79–97)
Platelets: 316 10*3/uL (ref 150–450)
RBC: 4.36 x10E6/uL (ref 3.77–5.28)
RDW: 12.8 % (ref 11.7–15.4)
WBC: 8.4 10*3/uL (ref 3.4–10.8)

## 2021-03-14 LAB — BMP8+ANION GAP
Anion Gap: 15 mmol/L (ref 10.0–18.0)
BUN/Creatinine Ratio: 20 (ref 12–28)
BUN: 12 mg/dL (ref 8–27)
CO2: 22 mmol/L (ref 20–29)
Calcium: 9.5 mg/dL (ref 8.7–10.3)
Chloride: 103 mmol/L (ref 96–106)
Creatinine, Ser: 0.59 mg/dL (ref 0.57–1.00)
Glucose: 115 mg/dL — ABNORMAL HIGH (ref 65–99)
Potassium: 4.2 mmol/L (ref 3.5–5.2)
Sodium: 140 mmol/L (ref 134–144)
eGFR: 99 mL/min/{1.73_m2} (ref >59)

## 2021-03-14 NOTE — Telephone Encounter (Signed)
Requesting lab results, please call pt back.  

## 2021-03-15 NOTE — Progress Notes (Signed)
Internal Medicine Clinic Attending  Case discussed with Dr. Johnson  At the time of the visit.  We reviewed the resident's history and exam and pertinent patient test results.  I agree with the assessment, diagnosis, and plan of care documented in the resident's note.  

## 2021-03-19 ENCOUNTER — Other Ambulatory Visit: Payer: Self-pay | Admitting: Internal Medicine

## 2021-03-19 DIAGNOSIS — E118 Type 2 diabetes mellitus with unspecified complications: Secondary | ICD-10-CM

## 2021-03-29 ENCOUNTER — Ambulatory Visit: Payer: Medicare Other | Admitting: Podiatry

## 2021-04-05 ENCOUNTER — Other Ambulatory Visit: Payer: Self-pay

## 2021-04-05 ENCOUNTER — Encounter: Payer: Self-pay | Admitting: Podiatry

## 2021-04-05 ENCOUNTER — Ambulatory Visit (INDEPENDENT_AMBULATORY_CARE_PROVIDER_SITE_OTHER): Payer: Medicare Other | Admitting: Podiatry

## 2021-04-05 DIAGNOSIS — Q828 Other specified congenital malformations of skin: Secondary | ICD-10-CM | POA: Diagnosis not present

## 2021-04-05 DIAGNOSIS — E1142 Type 2 diabetes mellitus with diabetic polyneuropathy: Secondary | ICD-10-CM

## 2021-04-06 NOTE — Progress Notes (Signed)
Subjective: Bridget Owens is a pleasant 65 y.o. female patient seen today for painful plantar calluses b/l.  Pain interferes with ambulation. Aggravating factors include weightbearing with and without wearing enclosed shoe gear. Pain is relieved with periodic professional debridement.  She does not want her toenails cut on today's visit.  PCP is Cato Mulligan, MD. Last visit was: 03/13/2021  Allergies  Allergen Reactions   Codeine Other (See Comments)    "felt funny"    Objective: Physical Exam  General: Bridget Owens is a pleasant 65 y.o. African American female, in NAD. AAO x 3.   Vascular:  Capillary fill time to digits <3 seconds b/l lower extremities. Faintly palpable DP pulse(s) b/l lower extremities. Faintly palpable PT pulse(s) b/l lower extremities. Pedal hair present. Lower extremity skin temperature gradient within normal limits. No pain with calf compression b/l. No edema noted b/l lower extremities.  Dermatological:  Pedal skin with normal turgor, texture and tone b/l lower extremities No open wounds b/l lower extremities No interdigital macerations b/l lower extremities Porokeratotic lesion(s) submet head 2 left foot, submet head 3 left foot, submet head 5 left foot, and submet head 5 right foot. No erythema, no edema, no drainage, no fluctuance.  Toenails 1-5 b/l elongated with no erythema, no edema, no drainage, no fluctuance.  Musculoskeletal:  Normal muscle strength 5/5 to all lower extremity muscle groups bilaterally. No pain crepitus or joint limitation noted with ROM b/l. Tailor's bunion deformity noted b/l lower extremities.  Neurological:  Pt has subjective symptoms of neuropathy. Protective sensation decreased with 10 gram monofilament b/l. Vibratory sensation intact b/l.  Assessment and Plan:  1. Porokeratosis   2. Diabetic peripheral neuropathy associated with type 2 diabetes mellitus ([place])      -Examined patient. -Patient to continue soft,  supportive shoe gear daily. -Painful porokeratotic lesion(s) submet head 2 left foot, submet head 3 left foot, submet head 5 left foot, and submet head 5 right foot pared and enucleated with sterile scalpel blade without incident. Total number of lesions debrided=4. -Patient to report any pedal injuries to medical professional immediately. -Patient/POA to call should there be question/concern in the interim.  Return in about 3 months (around 07/06/2021).  Marzetta Board, DPM

## 2021-04-13 ENCOUNTER — Ambulatory Visit (INDEPENDENT_AMBULATORY_CARE_PROVIDER_SITE_OTHER): Payer: Medicare Other | Admitting: Student

## 2021-04-13 ENCOUNTER — Other Ambulatory Visit: Payer: Self-pay

## 2021-04-13 ENCOUNTER — Encounter: Payer: Self-pay | Admitting: Student

## 2021-04-13 VITALS — BP 151/74 | HR 86 | Temp 98.4°F | Ht 64.0 in | Wt 172.1 lb

## 2021-04-13 DIAGNOSIS — G629 Polyneuropathy, unspecified: Secondary | ICD-10-CM | POA: Diagnosis not present

## 2021-04-13 DIAGNOSIS — G5792 Unspecified mononeuropathy of left lower limb: Secondary | ICD-10-CM

## 2021-04-13 MED ORDER — GABAPENTIN 300 MG PO CAPS: 300.0000 mg | ORAL_CAPSULE | Freq: Three times a day (TID) | ORAL | 0 refills | Status: DC | PRN

## 2021-04-13 NOTE — Patient Instructions (Signed)
[name], it was a pleasure seeing you today!  Today we discussed: - Leg pain: I have prescribed gabapentin to help with the pain. In addition, I recommend doing the stretches I gave you as well as Tylenol, ice/warm compress. Return to clinic if symptoms do not improve within the next 1-2 weeks.   Start the following medications: Meds ordered this encounter  Medications   gabapentin (NEURONTIN) 300 MG capsule    Sig: Take 1 capsule (300 mg total) by mouth 3 (three) times daily as needed.    Dispense:  30 capsule    Refill:  0     Follow-up:  if symptoms do not improve    Please make sure to arrive 15 minutes prior to your next appointment. If you arrive late, you may be asked to reschedule.   We look forward to seeing you next time. Please call our clinic at [phone] if you have any questions or concerns. The best time to call is Monday-Friday from 9am-4pm, but there is someone available 24/7. If after hours or the weekend, call the main hospital number and ask for the Internal Medicine Resident On-Call. If you need medication refills, please notify your pharmacy one week in advance and they will send [place] a request.  Thank you for letting us take part in your care. Wishing you the best!  Thank you, Sanjuan Dame, MD

## 2021-04-14 ENCOUNTER — Telehealth: Payer: Self-pay

## 2021-04-14 DIAGNOSIS — G5792 Unspecified mononeuropathy of left lower limb: Secondary | ICD-10-CM | POA: Insufficient documentation

## 2021-04-14 NOTE — Progress Notes (Signed)
   CC: left leg pain  HPI:  Bridget Owens is a 65 y.o. with medical history as below presenting to [facility] for left leg pain.  Please see problem-based list for further details, assessments, and plans.  Past Medical History:  Diagnosis Date   Adenomatous colon polyp 6/09   Resected on colonoscopy, no high grade dysplasia.    Allergic rhinitis    Bell's palsy 01/2009   Left sided   Chest pain    Exercise stress test negative 6/07   Colon polyps    03/22/2008: adenomatous polyps x3 w no dysplasia. Needs repeat colonoscopy in 3 years   Diabetic peripheral neuropathy ([facility])    External hemorrhoid    GERD (gastroesophageal reflux disease)    Hyperlipidemia    Hypertension    Hypertensive retinopathy    Followed by Dr. Herbert Deaner.   Insomnia    Intolerance, drug    Leg cramps on Lipitor   Lipoma 12/11   Posterior neck.    Osteoarthritis    Carpometacarpal joint of right thumb   Postmenopausal bleeding 9/05   Endometrial biopsy showed  FOCAL TUBAL METAPLASIA    Postmenopausal symptoms    Hot flashes, vaginal dryness, iritability, difficulty sleeping. as of 2005.   Sebaceous cyst of breast    Has been refered to derm.   Trigger finger    Right thumb   Type II diabetes mellitus ([facility])    Uterine fibroid    Review of Systems:  As per HPI  Physical Exam:  Vitals:   04/13/21 1523 04/13/21 1531  BP: (!) 156/75 (!) 151/74  Pulse: 87 86  Temp: 98.4 F (36.9 C)   TempSrc: Oral   SpO2: 100%   Weight: 172 lb 1.6 oz (78.1 kg)   Height: 5\' 4"  (1.626 m)    General: Uncomfortable appearing, in mild distress CV: Regular rate, rhythm. No murmurs, rubs, gallops. Pulm: Normal work of breathing on room air. MSK: Normal bulk, tone. No spinal or paraspinal tenderness. Hip ROM normal bilaterally. FABER negative. No tenderness to palpation on greater trochanter bilaterally. No tenderness to palpation of left sciatic nerve. Knee ROM normal bilaterally. No swelling, erythema, or tenderness on  knees bilaterally. No pitting edema bilaterally.  Skin: Warm, dry. No rashes or lesions appreciated. Neuro: Awake, alert, motor 5/5 in bilateral lower extremities. Sensation in tact bilateral lower extremities.  Assessment & Plan:   See Encounters Tab for problem based charting.  Patient discussed with Dr.  Jimmye Norman

## 2021-04-14 NOTE — Telephone Encounter (Signed)
Requesting to speak with a nurse about getting pain medication. Please call pt back.

## 2021-04-14 NOTE — Telephone Encounter (Signed)
RTC, patient states "I'm in pain, my legs hurt and those pills the Dr gave me yesterday ain't working".  Reiterated to patient that Gabapentin could take up to 2 weeks to provide some pain relief and to continue this medication as instructed by MD.  Also instructed patient to take OTC tylenol per bottle directions and to rotate ice and heat to legs per MD instructions at [place] yesterday.  She verbalized understanding. SChaplin, RN,BSN

## 2021-04-14 NOTE — Assessment & Plan Note (Addendum)
[name] is presenting today with one week history of left lower extremity pain. She reports onset of pain last week without any obvious inciting event, denying injury or trauma. Describes sharp, shooting pain that begins in her left buttock and down to her toes on the left foot. This is also associated with intermittent parasthesias, denies numbness or weakness. She denies any alleviate or aggravating factors, including change with position. She has not had tried any medication to help with the pain. She denies fevers, chills, myalgias, joint pains, rashes, urinary incontinence, bowel incontinence.   On exam, [name] is in obvious pain, writhing and uncomfortable appearing. Otherwise her exam is benign without tenderness to palpation and normal range of motion. It does not appear that her symptoms are due to systemic illness. There is a previous note 6 years ago regarding neuropathy 2/2 diabetes, but [name] states she has not had this pain before and is not taking any medications for neuropathic pain. Most likely etiology is compressive given sudden onset of symptoms, possibly piriformis syndrome. Believe lumbar spinal pathology less likely given no spinal tenderness, but cannot rule out. Will trial conservative management. If symptoms do not improve, can consider imaging for further evaluation.   - Gabapentin 300mg  three times daily as needed - Alternate with Tylenol 500mg  every 6 hours as needed - Ice/warm compress - Lumbar/gluteal exercises given to patient - Return to clinic if symptoms persist or worsen  ADDENDUM: [name] has called the clinic multiple times as her pain has not improved with gabapentin. We will trial pregabalin. If this does not improve pain can consider referral to sports medicine. - Lyrica 50mg  three times daily as needed- - Consider sports medicine referral

## 2021-04-17 ENCOUNTER — Telehealth: Payer: Self-pay | Admitting: Student

## 2021-04-17 NOTE — Telephone Encounter (Signed)
Pt requesting a call back.  Pt reports after her visit on 04/13/2021 the pain has gotten worse in  her leg, and she has never had a pain like this before.  Please call the pt back.

## 2021-04-18 ENCOUNTER — Other Ambulatory Visit: Payer: Self-pay | Admitting: Student

## 2021-04-18 DIAGNOSIS — G629 Polyneuropathy, unspecified: Secondary | ICD-10-CM

## 2021-04-18 MED ORDER — PREGABALIN 50 MG PO CAPS: 50.0000 mg | ORAL_CAPSULE | Freq: Three times a day (TID) | ORAL | 0 refills | Status: DC

## 2021-04-18 NOTE — Telephone Encounter (Signed)
Pt is calling back to f/u.  Patient states Dr. Collene Gobble was to call her back today about what to do for her leg pain.  Please call the patient back.

## 2021-04-18 NOTE — Progress Notes (Signed)
Internal Medicine Clinic Attending ? ?Case discussed with Dr. Braswell  At the time of the visit.  We reviewed the resident?s history and exam and pertinent patient test results.  I agree with the assessment, diagnosis, and plan of care documented in the resident?s note.  ?

## 2021-04-21 ENCOUNTER — Telehealth: Payer: Self-pay

## 2021-04-21 NOTE — Telephone Encounter (Signed)
Requesting to speak with a nurse about having leg and hip pain. Please call back.

## 2021-04-21 NOTE — Telephone Encounter (Signed)
Returned call to patient. States she p/u lyrica on Tuesday, took 2 doses that day, and 3 doses Wed and Thurs. Also, taking 2 aleve when she takes lyrica. States she has not had any relief from the leg pain. Explained next step would be referral to Sports Med per Provider's note on 7/12. She understood.

## 2021-04-21 NOTE — Telephone Encounter (Signed)
Per Dr. Donnetta Hail last note, if no improvement can consider sport's medicine. Will forward message to him since he evaluated the patient 8 days ago.

## 2021-04-24 NOTE — Telephone Encounter (Signed)
Pt calling back to report she is no better today with her pain level.  Patient also reports she is having a lot of swelling and that no one called her back on Friday about what to do.  Please call the patient back.

## 2021-04-25 ENCOUNTER — Encounter: Payer: Self-pay | Admitting: Student

## 2021-04-25 ENCOUNTER — Ambulatory Visit (INDEPENDENT_AMBULATORY_CARE_PROVIDER_SITE_OTHER): Payer: Medicare Other | Admitting: Student

## 2021-04-25 ENCOUNTER — Other Ambulatory Visit: Payer: Self-pay

## 2021-04-25 ENCOUNTER — Telehealth: Payer: Self-pay

## 2021-04-25 VITALS — BP 146/76 | HR 86 | Temp 98.0°F | Ht 64.0 in | Wt 174.2 lb

## 2021-04-25 DIAGNOSIS — G5792 Unspecified mononeuropathy of left lower limb: Secondary | ICD-10-CM | POA: Diagnosis not present

## 2021-04-25 MED ORDER — CELECOXIB 100 MG PO CAPS: 100.0000 mg | ORAL_CAPSULE | Freq: Two times a day (BID) | ORAL | 0 refills | Status: DC

## 2021-04-25 NOTE — Patient Instructions (Signed)
Bridget Owens,  It was a pleasure seeing you in the clinic today.   I have placed a referral to sports medicine clinic, someone will call you to set this appointment up. I have prescribed celebrex to help with your pain, please take twice daily as prescribed.  Please call our clinic at [phone] if you have any questions or concerns. The best time to call is Monday-Friday from 9am-4pm, but there is someone available 24/7 at the same number. If you need medication refills, please notify your pharmacy one week in advance and they will send [place] a request.   Thank you for letting us take part in your care. We look forward to seeing you next time!

## 2021-04-25 NOTE — Telephone Encounter (Signed)
RTC, patient advised she will need an appt for evaluation per Dr. Lorelee Cover instructions (see telephone note from 04/21/21).  Red team full, appt made for today, Dr. Allyson Sabal at 1415. SChaplin, RN,BSN

## 2021-04-25 NOTE — Telephone Encounter (Signed)
Requesting to speak with a nurse about hip pain. Please call back.

## 2021-04-25 NOTE — Assessment & Plan Note (Signed)
[name] is presenting today for reevaluation of her left lower extremity neuropathy.  Please see prior note from Dr. Collene Gobble for full details.  In summary, patient with left lower extremity mononeuropathy for the past couple weeks.  Denies any recent trauma.  States that she has sharp shooting pain beginning in her left buttock extending down to her toes on left foot only.  These symptoms improve with walking but worsen when lying flat on her back.  On physical exam, straight leg test was negative for sciatica and did not elicit pinpoint tenderness of the greater trochanter.  However, patient's symptomology is very concerning for compressive neuropathy.  She continues to deny fevers, chills, myalgias, joint pains, rashes, urinary incontinence, bowel incontinence.  Trials of gabapentin and Lyrica did not improve pain and neither did lumbar/gluteal exercises for possible piriformis syndrome.  At this point, I believe the patient with benefit immensely from referral to sports medicine clinic for further evaluation.  We will also prescribe Celebrex for 14-day course to help with pain.  Plan: -Referral to sports medicine clinic -Celebrex 100 mg twice daily for 14 days -Follow-up in 1 month

## 2021-04-25 NOTE — Progress Notes (Signed)
   CC: left lower extremity neuropathy  HPI:  [name] is a 65 y.o. female with history listed below presenting to the [facility] for left lower extremity neuropathy. Please see individualized problem based charting for full HPI.  Past Medical History:  Diagnosis Date   Adenomatous colon polyp 6/09   Resected on colonoscopy, no high grade dysplasia.    Allergic rhinitis    Bell's palsy 01/2009   Left sided   Chest pain    Exercise stress test negative 6/07   Colon polyps    03/22/2008: adenomatous polyps x3 w no dysplasia. Needs repeat colonoscopy in 3 years   Diabetic peripheral neuropathy (HCC)    External hemorrhoid    GERD (gastroesophageal reflux disease)    Hyperlipidemia    Hypertension    Hypertensive retinopathy    Followed by Dr. Herbert Deaner.   Insomnia    Intolerance, drug    Leg cramps on Lipitor   Lipoma 12/11   Posterior neck.    Osteoarthritis    Carpometacarpal joint of right thumb   Postmenopausal bleeding 9/05   Endometrial biopsy showed  FOCAL TUBAL METAPLASIA    Postmenopausal symptoms    Hot flashes, vaginal dryness, iritability, difficulty sleeping. as of 2005.   Sebaceous cyst of breast    Has been refered to derm.   Trigger finger    Right thumb   Type II diabetes mellitus (HCC)    Uterine fibroid     Review of Systems:  Negative aside from that listed in individualized problem based charting.  Physical Exam:  Vitals:   04/25/21 1412 04/25/21 1422  BP: (!) 161/79 (!) 146/76  Pulse: 89 86  Temp: 98 F (36.7 C)   TempSrc: Oral   SpO2: 100%   Weight: 174 lb 3.2 oz (79 kg)   Height: 5\' 4"  (1.626 m)    Physical Exam Constitutional:      Appearance: Normal appearance. She is not ill-appearing.  HENT:     Mouth/Throat:     Mouth: Mucous membranes are moist.     Pharynx: Oropharynx is clear. No oropharyngeal exudate.  Eyes:     Extraocular Movements: Extraocular movements intact.     Conjunctiva/sclera: Conjunctivae normal.     Pupils:  Pupils are equal, round, and reactive to light.  Cardiovascular:     Rate and Rhythm: Normal rate and regular rhythm.     Pulses: Normal pulses.     Heart sounds: Normal heart sounds. No murmur heard.   No friction rub. No gallop.  Pulmonary:     Effort: Pulmonary effort is normal.     Breath sounds: Normal breath sounds. No wheezing, rhonchi or rales.  Abdominal:     General: Bowel sounds are normal. There is no distension.     Palpations: Abdomen is soft.     Tenderness: There is no abdominal tenderness.  Musculoskeletal:        General: No swelling.     Cervical back: Normal range of motion.     Comments: Left lower extremity: no pain over greater trochanter, negative straight leg test, no erythema or warmth noted. Mild tenderness with left knee and left hip flexion. Gait normal.  Neurological:     Mental Status: She is alert.     Assessment & Plan:   See Encounters Tab for problem based charting.  Patient discussed with Dr.  Jimmye Norman

## 2021-04-28 ENCOUNTER — Encounter (HOSPITAL_COMMUNITY): Payer: Self-pay

## 2021-04-28 ENCOUNTER — Other Ambulatory Visit: Payer: Self-pay

## 2021-04-28 ENCOUNTER — Emergency Department (HOSPITAL_COMMUNITY): Payer: Medicare Other

## 2021-04-28 ENCOUNTER — Emergency Department (HOSPITAL_COMMUNITY)
Admission: EM | Admit: 2021-04-28 | Discharge: 2021-04-29 | Disposition: A | Discharge status: Home / Self Care | Payer: Medicare Other | Attending: Student | Admitting: Student

## 2021-04-28 DIAGNOSIS — R200 Anesthesia of skin: Secondary | ICD-10-CM | POA: Diagnosis not present

## 2021-04-28 DIAGNOSIS — Z5321 Procedure and treatment not carried out due to patient leaving prior to being seen by health care provider: Secondary | ICD-10-CM | POA: Insufficient documentation

## 2021-04-28 DIAGNOSIS — M25552 Pain in left hip: Secondary | ICD-10-CM | POA: Insufficient documentation

## 2021-04-28 NOTE — ED Triage Notes (Signed)
Pt arrives POV for eval of L sided hip pain x 1 mos, seen by PCP for same advised it was a "pinched nerve", but states she feels it is not. States pain radiates down L leg as well

## 2021-04-28 NOTE — ED Provider Notes (Signed)
Emergency Medicine Provider Triage Evaluation Note  Bridget Owens , a 64 y.o. female  was evaluated in triage.  Pt complains of right hip pain and leg numbness.  Diagnosed 1 month ago with a pinched nerve, asked to prescribe gabapentin without relief.  States she fell in [place] which ended June which started the pain.  Pain is like a numbness that goes down the posterior side of her leg yet to her ankle.  No fevers or chills, no previous spinal surgeries or hip fractures..  Review of Systems  Positive: Hip pain, leg pain Negative: Urinary incontinence, saddle anesthesia, bilateral leg pain  Physical Exam  BP (!) 148/82   Pulse 89   Temp 98.7 F (37.1 C) (Oral)   Resp 14   Ht '5\' 4"'$  (1.626 m)   Wt 80 kg   SpO2 98%   BMI 30.27 kg/m  Gen:   Awake, no distress   Resp:  Normal effort  MSK:   Moves extremities without difficulty  Other:  Positive right straight leg raise. CN 3 through 12 grossly intact  Medical Decision Making  Medically screening exam initiated at 4:05 PM.  Appropriate orders placed.  Bridget Owens was informed that the remainder of the evaluation will be completed by another provider, this initial triage assessment does not replace that evaluation, and the importance of remaining in the ED until their evaluation is complete.     Sherrill Raring, PA-C 04/28/21 1608    Pattricia Boss, MD 05/06/21 [phone]

## 2021-04-29 NOTE — Progress Notes (Signed)
Internal Medicine Clinic Attending  Case discussed with Dr. Jinwala  At the time of the visit.  We reviewed the resident's history and exam and pertinent patient test results.  I agree with the assessment, diagnosis, and plan of care documented in the resident's note.  

## 2021-05-04 ENCOUNTER — Other Ambulatory Visit: Payer: Self-pay

## 2021-05-04 ENCOUNTER — Ambulatory Visit (INDEPENDENT_AMBULATORY_CARE_PROVIDER_SITE_OTHER): Payer: Medicare Other | Admitting: Sports Medicine

## 2021-05-04 VITALS — BP 132/82 | Ht 66.0 in | Wt 174.0 lb

## 2021-05-04 DIAGNOSIS — M5126 Other intervertebral disc displacement, lumbar region: Secondary | ICD-10-CM

## 2021-05-04 MED ORDER — CYCLOBENZAPRINE HCL 5 MG PO TABS: ORAL_TABLET | ORAL | 0 refills | Status: DC

## 2021-05-04 NOTE — Progress Notes (Signed)
   Subjective:    Patient ID: Bridget Owens, female    DOB: 02-07-1956, 65 y.o.   MRN: [id number]  HPI chief complaint: Left leg pain  Patient is a very pleasant 65 year old female that comes in today complaining of 1 year of left leg pain.  She fell at [facility] on June 12 but really did not have any low back or leg pain at that time.  Over the next several months, she began to get a burning pain that began in the left buttocks and radiated down the left leg to the left ankle.  This pain has progressively gotten worse.  It is most noticeable with sitting for long periods of time.  Symptoms improve with standing and walking.  She also endorses muscle spasms of her left foot, primarily at night.  X-rays of her lumbar spine done in March 20, 2020 were unremarkable.  She denies any low back pain.  No weakness.  She has tried several different medications including gabapentin and Advil without any relief of symptoms.  She denies any similar problems prior to her fall.  No pain in the left groin.  Past medical history reviewed Medications reviewed Allergies reviewed    Review of Systems As above    Objective:   Physical Exam  Well-developed, well-nourished.  No acute distress  Lumbar spine: No tenderness to palpation along the lumbar midline.  No paraspinal muscle spasm.  Full flexion and extension but she does have some reproducible pain with flexion.  Left hip: Smooth painless hip range of motion with a negative logroll.  Neurological exam: Strength is 5/5 in both lower extremities.  Reflexes are equal at the Achilles and patellar tendons bilaterally.  Sensation is intact to light touch grossly.  X-rays of her lumbar spine done in June 2021 are as above      Assessment & Plan:   Left leg pain likely secondary to lumbar disc protrusion/possible herniation  Patient will start physical therapy and will follow up with me again in 3 to 4 weeks.  I given her a prescription for Flexeril to take  5 to 10 mg at night as needed for pain and spasm.  If her symptoms are not improving at follow-up then we will consider an MRI to rule out a lumbar disc herniation.  She will call with questions or concerns in the interim.

## 2021-05-06 ENCOUNTER — Other Ambulatory Visit: Payer: Self-pay | Admitting: Student

## 2021-05-08 ENCOUNTER — Other Ambulatory Visit: Payer: Self-pay

## 2021-05-08 ENCOUNTER — Encounter: Payer: Self-pay | Admitting: Physical Therapy

## 2021-05-08 ENCOUNTER — Ambulatory Visit: Payer: Medicare Other | Attending: Sports Medicine | Admitting: Physical Therapy

## 2021-05-08 DIAGNOSIS — R208 Other disturbances of skin sensation: Secondary | ICD-10-CM | POA: Diagnosis present

## 2021-05-08 DIAGNOSIS — M79605 Pain in left leg: Secondary | ICD-10-CM | POA: Diagnosis present

## 2021-05-08 DIAGNOSIS — M6281 Muscle weakness (generalized): Secondary | ICD-10-CM | POA: Diagnosis present

## 2021-05-08 NOTE — Therapy (Signed)
[place], [place], [address] Phone: [phone]   Fax:  [phone]  Physical Therapy Evaluation  Patient Details  Name: Bridget Owens MRN: [id number] Date of Birth: [birth year] Referring Provider (PT): Dr. Lilia Argue   Encounter Date: 05/08/2021   PT End of Session - 05/08/21 1333     Visit Number 1    Number of Visits 8    Date for PT Re-Evaluation 06/05/21    Authorization Type MCR, MCD    PT Start Time 1102    PT Stop Time 1149    PT Time Calculation (min) 47 min    Activity Tolerance Patient tolerated treatment well    Behavior During Therapy [facility] for tasks assessed/performed             Past Medical History:  Diagnosis Date   Adenomatous colon polyp 6/09   Resected on colonoscopy, no high grade dysplasia.    Allergic rhinitis    Bell's palsy 01/2009   Left sided   Chest pain    Exercise stress test negative 6/07   Colon polyps    03/22/2008: adenomatous polyps x3 w no dysplasia. Needs repeat colonoscopy in 3 years   Diabetic peripheral neuropathy (HCC)    External hemorrhoid    GERD (gastroesophageal reflux disease)    Hyperlipidemia    Hypertension    Hypertensive retinopathy    Followed by Dr. Herbert Deaner.   Insomnia    Intolerance, drug    Leg cramps on Lipitor   Lipoma 12/11   Posterior neck.    Osteoarthritis    Carpometacarpal joint of right thumb   Postmenopausal bleeding 9/05   Endometrial biopsy showed  FOCAL TUBAL METAPLASIA    Postmenopausal symptoms    Hot flashes, vaginal dryness, iritability, difficulty sleeping. as of 2005.   Sebaceous cyst of breast    Has been refered to derm.   Trigger finger    Right thumb   Type II diabetes mellitus (HCC)    Uterine fibroid     Past Surgical History:  Procedure Laterality Date   TUBAL LIGATION      There were no vitals filed for this visit.    Subjective Assessment - 05/08/21 1110     Subjective Went to [facility]  in June 2021  and she slipped and fell on her L side.  It did not start bothering her until about a month ago.   She had breast pain initially but that is better  Pain is in her L buttock and L groin.  She was seen in ED and left AMA.  Saw Dr.  and he sent me here.  No XR taken. it feel like her leg is on fire.    Pertinent History HTN, diabetes    Limitations Lifting;Standing;Walking;House hold activities;Sitting    How long can you sit comfortably? depends    How long can you walk comfortably? she can walk 30 min then buttock pain increases    Diagnostic tests XR L hip.pelvis:  Mild degenerative joint disease of the left hip, 06/2020: MRI: T11-T12 Leftward disc protrusion and annular  tear is present. Mild left foraminal narrowing is present. The  central canal is patent.    Patient Stated Goals Pt would like this thing well so I can walk.    Currently in Pain? Yes    Pain Score 9     Pain Location Hip    Pain Orientation Anterior;Posterior;Lateral;Left    Pain Descriptors /  Indicators Burning;Aching;Sore    Pain Type Acute pain    Pain Radiating Towards to my foot, lower leg    Pain Onset More than a month ago    Pain Frequency Constant    Aggravating Factors  i dont know    Pain Relieving Factors changing positons, walking    Effect of Pain on Daily Activities pain affect her sometime with ADLs    Multiple Pain Sites No                [facility] PT Assessment - 05/08/21 0001       Assessment   Medical Diagnosis lumbar disc herniation    Referring Provider (PT) Dr. 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    Leisure has 2 dogs, cards      Cognition   Overall Cognitive Status Within Functional Limits for tasks assessed      Observation/Other Assessments   Focus on Therapeutic Outcomes (FOTO)  50%      Observation/Other Assessments-Edema    Edema --   per patient report L thigh swollen     Sensation   Light Touch Impaired by gross assessment    Additional Comments hyposensitive L thigh, calf, foot      Posture/Postural Control   Posture/Postural Control Postural limitations    Postural Limitations Rounded Shoulders;Forward head;Increased thoracic kyphosis    Posture Comments hips FW , sway back      AROM   Lumbar Flexion WFL no pain    Lumbar Extension 25% no pain    Lumbar - Right Side Bend WFL    Lumbar - Left Side Bend WFL    Lumbar - Right Rotation 25% no pain    Lumbar - Left Rotation 25% no pain      Strength   Right Hip Flexion 4+/5    Right Hip Extension 4/5    Right Hip ABduction 4/5    Left Hip Flexion 3+/5    Left Hip Extension 3+/5    Left Hip ABduction 3+/5    Right Knee Flexion 5/5    Right Knee Extension 5/5    Left Knee Flexion 4+/5    Left Knee Extension 4+/5    Right Ankle Dorsiflexion 5/5    Left Ankle Dorsiflexion 4+/5      Palpation   Spinal mobility no pain, stiffness throughout      Special Tests    Special Tests Hip Special Tests;Lumbar    Lumbar Tests Prone Knee Bend Test;Straight Leg Raise      Prone Knee Bend Test   Side Left    Comment pain in L thigh, no back pain      Straight Leg Raise   Findings Negative    Comment bilateral      Hip Scouring  Findings Negative    Side Left                        Objective measurements completed on examination: See above findings.               PT Education - 05/08/21 1145     Education Details PT/POC , FOTO    Person(s) Educated Patient     Methods Explanation    Comprehension Verbalized understanding;Returned demonstration                 PT Long Term Goals - 05/08/21 1334       PT LONG TERM GOAL #1   Title Pt will be I with HEP for general back and LE mobility    Time 4    Period Weeks    Status New    Target Date 06/05/21      PT LONG TERM GOAL #2   Title Pt will report pain more intermittent and decreased intensity in LLE <5/10 most of the time.    Time 4    Period Weeks    Status New    Target Date 06/05/21      PT LONG TERM GOAL #3   Title Pt will be able to sit symmetrically and more comfortably for 30-45 min for meals    Time 4    Period Weeks    Status New    Target Date 06/05/21      PT LONG TERM GOAL #4   Title Pt will be able to sleep with less leg burning and pain to improve rest, mood and home tasks the following day, rated 5/10 or less    Time 4    Period Weeks    Status New    Target Date 06/05/21                    Plan - 05/08/21 1338     Clinical Impression Statement Patient presents for mod complexity eval for pain in LLE due to fall on her L hip.  Imaging done the following Sept. 2020 showed definite thoracic and lumbar involvement. She had/has no back pain .  She did have several months of min to moderate pain , her LLE pain and symptoms have only increased in the past 4-5 weeks.  She has a definite neurogenic component due either to diabetic neuropathy and/or lumbar spine. Time spent today teasing out symptoms as she did state she could do most of her normal activites but pain rated 9/10 at rest. Her hip was not painful to touch or with testing.  LE numbness reduced with extension in prone.  She has min weakness in hips but not overtly weak in ankle or knee. Therapy will be aimed at reducing inflammation in spine, hips and increasing postural awareness, strength and overall hip mobility , preferential in extension.  She will benefit from skilled PT to improve her comfort  in her home, increase functional capacity and reduce pain.    Personal Factors and Comorbidities Education;Social Background;Time since onset of injury/illness/exacerbation;Comorbidity 3+    Comorbidities diabetes, neuropathy, HTN, known hip OA and thoracic, lumbar disc disease    Examination-Activity Limitations Bed Mobility;Squat;Lift;Locomotion Level;Stand;Sleep;Stairs    Examination-Participation Restrictions Community Activity;Interpersonal Relationship;Cleaning;Laundry    Stability/Clinical Decision Making Evolving/Moderate complexity    Clinical Decision Making Moderate    Rehab Potential Good    PT Frequency 2x / week    PT Duration  4 weeks    PT Treatment/Interventions ADLs/Self Care Home Management;Electrical Stimulation;Therapeutic activities;Manual techniques;Patient/family education;Therapeutic exercise;Balance training;Moist Heat;Functional mobility training;Neuromuscular re-education;Cryotherapy;Dry needling;Traction    PT Next Visit Plan develop HEP, extension? general strength: stretch ant hip, core    PT Home Exercise Plan not done at eval    Consulted and Agree with Plan of Care Patient             Patient will benefit from skilled therapeutic intervention in order to improve the following deficits and impairments:  Decreased endurance, Decreased mobility, Difficulty walking, Hypomobility, Postural dysfunction, Pain, Impaired flexibility, Increased fascial restricitons, Decreased strength, Decreased activity tolerance, Increased edema, Impaired sensation  Visit Diagnosis: Pain in left leg  Muscle weakness (generalized)     Problem List Patient Active Problem List   Diagnosis Date Noted   Mononeuropathy of left lower extremity 04/14/2021   Bacterial sinusitis 02/16/2021   Mass of upper inner quadrant of left breast 10/10/2020   Breast pain, left 09/26/2020   Need for influenza vaccination 07/01/2020   Back pain 03/22/2020   Vulvovaginitis 02/02/2020    Osteoporosis screening 02/02/2020   Non-scarring hair loss 07/27/2019   At risk for polypharmacy 02/27/2018   Viral URI with cough 10/11/2017   Hypertrophic toenail 09/05/2017   Pulmonary fibrosis, unspecified ([place]) 09/05/2016   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   Mixed incontinence urge and stress 12/21/2014   Neuropathy, lower extremity 12/21/2014   Seasonal allergies 10/06/2014   Ear pain, bilateral 05/30/2014   Cigarette smoker 04/03/2013   Health care maintenance 02/03/2013   Brain lipoma, 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

## 2021-05-10 ENCOUNTER — Encounter: Payer: Medicare Other | Admitting: Student

## 2021-05-12 ENCOUNTER — Other Ambulatory Visit: Payer: Self-pay

## 2021-05-12 ENCOUNTER — Ambulatory Visit: Payer: Medicare Other

## 2021-05-12 ENCOUNTER — Other Ambulatory Visit: Payer: Self-pay | Admitting: *Deleted

## 2021-05-12 DIAGNOSIS — R208 Other disturbances of skin sensation: Secondary | ICD-10-CM

## 2021-05-12 DIAGNOSIS — M79605 Pain in left leg: Secondary | ICD-10-CM

## 2021-05-12 DIAGNOSIS — M6281 Muscle weakness (generalized): Secondary | ICD-10-CM

## 2021-05-12 DIAGNOSIS — I10 Essential (primary) hypertension: Secondary | ICD-10-CM

## 2021-05-12 MED ORDER — HYDROCHLOROTHIAZIDE 25 MG PO TABS: 25.0000 mg | ORAL_TABLET | Freq: Every day | ORAL | 1 refills | Status: DC

## 2021-05-12 NOTE — Therapy (Addendum)
[place], [place], [address] Phone: [phone]   Fax:  [phone]  Physical Therapy Treatment/Discharge  Patient Details  Name: Bridget Owens MRN: [id number] Date of Birth: Aug 16, 1956 Referring Provider (PT): Dr. Lilia Argue   Encounter Date: 05/12/2021   PT End of Session - 05/12/21 1032     Visit Number 2    Number of Visits 8    Date for PT Re-Evaluation 06/05/21    Authorization Type MCR, MCD    Authorization Time Period FOTO v6,[id number]    Progress Note Due on Visit 10    PT Start Time 1000    PT Stop Time 1045    PT Time Calculation (min) 45 min    Activity Tolerance Patient tolerated treatment well    Behavior During Therapy [facility] for tasks assessed/performed             Past Medical History:  Diagnosis Date   Adenomatous colon polyp 6/09   Resected on colonoscopy, no high grade dysplasia.    Allergic rhinitis    Bell's palsy 01/2009   Left sided   Chest pain    Exercise stress test negative 6/07   Colon polyps    03/22/2008: adenomatous polyps x3 w no dysplasia. Needs repeat colonoscopy in 3 years   Diabetic peripheral neuropathy (HCC)    External hemorrhoid    GERD (gastroesophageal reflux disease)    Hyperlipidemia    Hypertension    Hypertensive retinopathy    Followed by Dr. Herbert Deaner.   Insomnia    Intolerance, drug    Leg cramps on Lipitor   Lipoma 12/11   Posterior neck.    Osteoarthritis    Carpometacarpal joint of right thumb   Postmenopausal bleeding 9/05   Endometrial biopsy showed  FOCAL TUBAL METAPLASIA    Postmenopausal symptoms    Hot flashes, vaginal dryness, iritability, difficulty sleeping. as of 2005.   Sebaceous cyst of breast    Has been refered to derm.   Trigger finger    Right thumb   Type II diabetes mellitus (HCC)    Uterine fibroid     Past Surgical History:  Procedure Laterality Date   TUBAL LIGATION      There were no vitals filed for this  visit.   Subjective Assessment - 05/12/21 1004     Subjective Pt reports feeling increased L thigh and glute pain today. She adds that she is ready to initiate PT at this time.    Pertinent History HTN, diabetes    Limitations Lifting;Standing;Walking;House hold activities;Sitting    Currently in Pain? Yes    Pain Score 9     Pain Location Hip    Pain Orientation Left;Anterior;Posterior;Lateral    Pain Descriptors / Indicators Aching;Burning;Sore                [facility] PT Assessment - 05/12/21 0001       Special Tests   Other special tests Long-axis distraction = complete remission of pain; Repeated lumbar extension = centralization of pain                           [facility] Adult PT Treatment/Exercise - 05/12/21 0001       Lumbar Exercises: Stretches   Lower Trunk Rotation Other (comment)   2x10 BIL   Other Lumbar Stretch Exercise Cobra stretch 3x30sec      Lumbar Exercises: Standing   Other Standing Lumbar  Exercises Pallof press with RTB 2x10 BIL    Other Standing Lumbar Exercises repeated lumbar extension x20      Lumbar Exercises: Supine   Large Ball Abdominal Isometric Other (comment)   3x30sec with green physioball     Lumbar Exercises: Prone   Other Prone Lumbar Exercises knee planks 3x30sec      Manual Therapy   Manual Therapy Manual Traction    Manual Traction BIL LAD x5 minutes                    PT Education - 05/12/21 1029     Education Details Educated on proper form with new exercises    Person(s) Educated Patient    Methods Explanation;Handout;Tactile cues;Verbal cues;Demonstration    Comprehension Verbalized understanding;Returned demonstration;Verbal cues required;Tactile cues required                 PT Long Term Goals - 05/08/21 1334       PT LONG TERM GOAL #1   Title Pt will be I with HEP for general back and LE mobility    Time 4    Period Weeks    Status New    Target Date 06/05/21      PT LONG  TERM GOAL #2   Title Pt will report pain more intermittent and decreased intensity in LLE <5/10 most of the time.    Time 4    Period Weeks    Status New    Target Date 06/05/21      PT LONG TERM GOAL #3   Title Pt will be able to sit symmetrically and more comfortably for 30-45 min for meals    Time 4    Period Weeks    Status New    Target Date 06/05/21      PT LONG TERM GOAL #4   Title Pt will be able to sleep with less leg burning and pain to improve rest, mood and home tasks the following day, rated 5/10 or less    Time 4    Period Weeks    Status New    Target Date 06/05/21                   Plan - 05/12/21 1032     Clinical Impression Statement Pt responded well to all interventions today, demonstrating proper form with regular VC and TC. She also reports mild increase in L LE sxs with strengthening exercises. However, she reports complete remission of sxs following manual long-axis distraction. She will continue to benefit from skilled PT to address her primary impairments and return to her prior level of function without limitation.    Personal Factors and Comorbidities Education;Social Background;Time since onset of injury/illness/exacerbation;Comorbidity 3+    Comorbidities diabetes, neuropathy, HTN, known hip OA and thoracic, lumbar disc disease    Examination-Activity Limitations Bed Mobility;Squat;Lift;Locomotion Level;Stand;Sleep;Stairs    Examination-Participation Restrictions Community Activity;Interpersonal Relationship;Cleaning;Laundry    Stability/Clinical Decision Making Evolving/Moderate complexity    Clinical Decision Making Moderate    Rehab Potential Good    PT Frequency 2x / week    PT Duration 4 weeks    PT Treatment/Interventions ADLs/Self Care Home Management;Electrical Stimulation;Therapeutic activities;Manual techniques;Patient/family education;Therapeutic exercise;Balance training;Moist Heat;Functional mobility training;Neuromuscular  re-education;Cryotherapy;Dry needling;Traction    PT Next Visit Plan Trial mechanical traction, progress hip/ core strengthening    PT Home Exercise Plan [id number]    Consulted and Agree with Plan of Care Patient  Patient will benefit from skilled therapeutic intervention in order to improve the following deficits and impairments:  Decreased endurance, Decreased mobility, Difficulty walking, Hypomobility, Postural dysfunction, Pain, Impaired flexibility, Increased fascial restricitons, Decreased strength, Decreased activity tolerance, Increased edema, Impaired sensation  Visit Diagnosis: Pain in left leg  Muscle weakness (generalized)  Other disturbances of skin sensation     Problem List Patient Active Problem List   Diagnosis Date Noted   Mononeuropathy of left lower extremity 04/14/2021   Bacterial sinusitis 02/16/2021   Mass of upper inner quadrant of left breast 10/10/2020   Breast pain, left 09/26/2020   Need for influenza vaccination 07/01/2020   Back pain 03/22/2020   Vulvovaginitis 02/02/2020   Osteoporosis screening 02/02/2020   Non-scarring hair loss 07/27/2019   At risk for polypharmacy 02/27/2018   Viral URI with cough 10/11/2017   Hypertrophic toenail 09/05/2017   Pulmonary fibrosis, unspecified ([place]) 09/05/2016   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   Mixed incontinence urge and stress 12/21/2014   Neuropathy, lower extremity 12/21/2014   Seasonal allergies 10/06/2014   Ear pain, bilateral 05/30/2014   Cigarette smoker 04/03/2013   Health care maintenance 02/03/2013   Brain lipoma, 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 [place], [place], [address] Phone: [phone]   Fax:  [phone]  Name: Bridget Owens MRN: [id number] Date of Birth: 1956-04-27  PHYSICAL THERAPY DISCHARGE SUMMARY  Visits from Start of Care: 2  Current functional level related to goals / functional outcomes: See above    Remaining deficits: Unknown   Education / Equipment: HEP    Patient agrees to discharge. Patient goals were not met. Patient is being discharged due to not returning since the last visit.  Raeford Razor, PT 06/19/21 10:39 AM Phone: [phone] Fax: [phone]

## 2021-05-12 NOTE — Patient Instructions (Signed)
  Bridget Owens

## 2021-05-15 ENCOUNTER — Telehealth: Payer: Self-pay | Admitting: Physical Therapy

## 2021-05-15 ENCOUNTER — Ambulatory Visit: Payer: Medicare Other | Admitting: Physical Therapy

## 2021-05-15 NOTE — Telephone Encounter (Signed)
Called patient regarding her missed appt today at 10:15. As I was leaving a voicemail, mid message, the receiver hung up the call.   Unable to reinforce the attendance policy.   Raeford Razor, PT 05/15/21 10:40 AM Phone: [phone] Fax: [phone]

## 2021-05-17 ENCOUNTER — Encounter: Payer: Self-pay | Admitting: Internal Medicine

## 2021-05-17 ENCOUNTER — Ambulatory Visit: Payer: Medicare Other | Admitting: Physical Therapy

## 2021-05-17 ENCOUNTER — Ambulatory Visit (INDEPENDENT_AMBULATORY_CARE_PROVIDER_SITE_OTHER): Payer: Medicare Other | Admitting: Internal Medicine

## 2021-05-17 VITALS — BP 142/68 | HR 87 | Temp 97.7°F | Wt 169.5 lb

## 2021-05-17 DIAGNOSIS — M7051 Other bursitis of knee, right knee: Secondary | ICD-10-CM

## 2021-05-17 DIAGNOSIS — M25562 Pain in left knee: Secondary | ICD-10-CM | POA: Diagnosis present

## 2021-05-17 DIAGNOSIS — M7050 Other bursitis of knee, unspecified knee: Secondary | ICD-10-CM

## 2021-05-17 MED ORDER — NAPROXEN 500 MG PO TABS
500.0000 mg | ORAL_TABLET | Freq: Two times a day (BID) | ORAL | 0 refills | Status: AC
Start: 2021-05-17 — End: 2021-05-27

## 2021-05-17 NOTE — Patient Instructions (Addendum)
Thank you for trusting me with your care. To recap, today we discussed the following:  1.Left Knee Pain - naproxen (NAPROSYN) 500 MG tablet; Take 1 tablet (500 mg total) by mouth 2 (two) times daily with a meal for 10 days.  Dispense: 20 tablet; Refill: 0

## 2021-05-17 NOTE — Progress Notes (Signed)
   CC: knee pain  HPI:[name] is a 65 y.o. female who presents for evaluation of knee pain. Please see individual problem based A/P for details.  Past Medical History:  Diagnosis Date   Adenomatous colon polyp 6/09   Resected on colonoscopy, no high grade dysplasia.    Allergic rhinitis    Bell's palsy 01/2009   Left sided   Chest pain    Exercise stress test negative 6/07   Colon polyps    03/22/2008: adenomatous polyps x3 w no dysplasia. Needs repeat colonoscopy in 3 years   Diabetic peripheral neuropathy (HCC)    External hemorrhoid    GERD (gastroesophageal reflux disease)    Hyperlipidemia    Hypertension    Hypertensive retinopathy    Followed by Dr. Herbert Deaner.   Insomnia    Intolerance, drug    Leg cramps on Lipitor   Lipoma 12/11   Posterior neck.    Osteoarthritis    Carpometacarpal joint of right thumb   Postmenopausal bleeding 9/05   Endometrial biopsy showed  FOCAL TUBAL METAPLASIA    Postmenopausal symptoms    Hot flashes, vaginal dryness, iritability, difficulty sleeping. as of 2005.   Sebaceous cyst of breast    Has been refered to derm.   Trigger finger    Right thumb   Type II diabetes mellitus (HCC)    Uterine fibroid    Review of Systems:   Review of Systems  Constitutional:  Negative for chills and fever.  Musculoskeletal:  Positive for back pain, falls and joint pain.  Neurological:  Negative for dizziness and weakness.    Physical Exam: Vitals:   05/17/21 1516  BP: (!) 142/68  Pulse: 87  Temp: 97.7 F (36.5 C)  TempSrc: Oral  SpO2: 100%  Weight: 169 lb 8 oz (76.9 kg)   General: Patient ambulates on her own in exam room, NAD during interview HEENT: Conjunctiva nl , antiicteric sclerae Cardiovascular: Normal rate, regular rhythm.  No murmurs, rubs, or gallops Pulmonary : Equal breath sounds, No wheezes, rales, or rhonchi Abdominal: soft, nontender,  bowel sounds present Ext: No edema in lower extremities, no tenderness to  palpation of lower extremities.  Knee: - Inspection: No gross deformity. No effusion. No erythema or bruising. Skin intact, does have abrasion on left ankle and elbow. Palpation over lateral side of knee joint. Full ROM. Strength 5/5. NV intact. No laxity in joint. Pain with [place].   Assessment & Plan:   See Encounters Tab for problem based charting.  Patient discussed with Dr. Philipp Ovens

## 2021-05-21 ENCOUNTER — Encounter: Payer: Self-pay | Admitting: Internal Medicine

## 2021-05-21 DIAGNOSIS — M25562 Pain in left knee: Secondary | ICD-10-CM | POA: Insufficient documentation

## 2021-05-21 NOTE — Assessment & Plan Note (Addendum)
Almost one year ago she fell at [facility] after she had nerve pain in her chest. This went away and her left hip started to hurt. The pain radiated down into her groin. She had another fall on Monday and this is her acute concern. She was walking and her knee gave way, then she fell on concrete. She has abrasion to left anterior ankle and left elbow. On exam she has tenderness on lateral aspect of left knee.   Assessment/Plan: Left knee pain. Patient's story of knee giving out is concern for possible meniscal tear. Other possibilities are exacerbation of osteoarthritis of left knee and given exam I believe this is more likely.  Will treat with short course of NSAIDS , if not improving gave patient return precautions. Can consider further imaging and steroid injection at this time. In regards to her mention of hip pain, review of recent imaging showed no fracture of hip.Patient is currently in PT for possible lumbar disc herniation and following with sports medicine.

## 2021-05-24 ENCOUNTER — Ambulatory Visit: Payer: Medicare Other | Admitting: Physical Therapy

## 2021-05-24 ENCOUNTER — Telehealth: Payer: Self-pay | Admitting: Physical Therapy

## 2021-05-24 NOTE — Telephone Encounter (Signed)
Patient missed her appt today, called to remind her and discuss her plan for cont therapy.  She states she called to cancel but the appt was not cancelled on the schedule and she did not recall when she did this.  She sees Dr. Micheline Chapman tomorrow, reminded of her appt time. She asked that I cancel Friday's PT appt. And all of the other appts after that.  I will keep her chart open and if she does decide to come back in the next week, she will need to make 1 appt at a time due to our policy.   Raeford Razor, PT 05/24/21 10:43 AM Phone: [phone] Fax: [phone]

## 2021-05-25 ENCOUNTER — Other Ambulatory Visit: Payer: Self-pay

## 2021-05-25 ENCOUNTER — Ambulatory Visit (INDEPENDENT_AMBULATORY_CARE_PROVIDER_SITE_OTHER): Payer: Medicare Other | Admitting: Sports Medicine

## 2021-05-25 ENCOUNTER — Telehealth: Payer: Self-pay | Admitting: Internal Medicine

## 2021-05-25 VITALS — Ht 68.0 in | Wt 164.0 lb

## 2021-05-25 DIAGNOSIS — M5126 Other intervertebral disc displacement, lumbar region: Secondary | ICD-10-CM

## 2021-05-25 NOTE — Progress Notes (Signed)
Patient ID: Bridget Owens, female   DOB: 12/26/1955, 65 y.o.   MRN: [id number]  Bridget Owens comes in today with persistent left leg pain and weakness.  She has only attended a couple of therapy visits but she has not noticed any improvement.  Physical exam was not repeated.  We simply talked about her ongoing sciatica.  At this point I would like to get an MRI of her lumbar spine specifically to rule out a lumbar disc herniation.  Phone follow-up with those results when available we will delineate further treatment based on those findings.  I will also give her a prescription for a rolling walker to help prevent future falls.

## 2021-05-25 NOTE — Telephone Encounter (Signed)
No recent antibiotics noted per chart review. Returned call to patient. No answer. Recording states call cannot be completed at this time.

## 2021-05-25 NOTE — Progress Notes (Signed)
Internal Medicine Clinic Attending  Case discussed with Dr. Steen  At the time of the visit.  We reviewed the resident's history and exam and pertinent patient test results.  I agree with the assessment, diagnosis, and plan of care documented in the resident's note.  

## 2021-05-25 NOTE — Telephone Encounter (Signed)
Pt requesting a call back.  Patient states she is itching and burning really bad in her vaginal area since she has been taking her antibiotics.   Please call back.

## 2021-05-31 ENCOUNTER — Ambulatory Visit: Payer: Medicare Other | Admitting: Physical Therapy

## 2021-05-31 ENCOUNTER — Other Ambulatory Visit: Payer: Self-pay

## 2021-05-31 ENCOUNTER — Ambulatory Visit
Admission: RE | Admit: 2021-05-31 | Discharge: 2021-05-31 | Disposition: A | Discharge status: Home / Self Care | Payer: Medicare Other | Source: Ambulatory Visit | Attending: Sports Medicine | Admitting: Sports Medicine

## 2021-05-31 DIAGNOSIS — M5126 Other intervertebral disc displacement, lumbar region: Secondary | ICD-10-CM

## 2021-06-04 ENCOUNTER — Other Ambulatory Visit: Payer: Medicare Other

## 2021-06-05 ENCOUNTER — Telehealth: Payer: Self-pay | Admitting: Sports Medicine

## 2021-06-05 NOTE — Telephone Encounter (Signed)
  MRI of the lumbar spine reviewed.  It is unremarkable.  No evidence of lumbar disc herniation.  I have no explanation for her left-sided leg pain.  She may need a referral to neurology to evaluate further but at this time I will defer that decision to her PCP.  She will be notified via telephone today of her MRI results and she will follow-up with me as needed.

## 2021-06-07 ENCOUNTER — Encounter: Payer: Medicare Other | Admitting: Physical Therapy

## 2021-06-13 ENCOUNTER — Telehealth: Payer: Self-pay

## 2021-06-13 NOTE — Telephone Encounter (Signed)
Requesting refill on pain med @ [id number] - [place], [place]. Please call pt back.

## 2021-06-13 NOTE — Telephone Encounter (Addendum)
Return pt's call - stated she needs something for pain. Stated her entire leg is hurting and she "can't walk". I asked about sports medicine- stated she went and MRI was ordered and it did not show anything Please advise.

## 2021-06-15 ENCOUNTER — Telehealth: Payer: Self-pay | Admitting: *Deleted

## 2021-06-15 ENCOUNTER — Other Ambulatory Visit: Payer: Self-pay | Admitting: Student

## 2021-06-15 DIAGNOSIS — M25562 Pain in left knee: Secondary | ICD-10-CM

## 2021-06-15 MED ORDER — NAPROXEN 500 MG PO TABS: 500.0000 mg | ORAL_TABLET | Freq: Two times a day (BID) | ORAL | 0 refills | Status: AC

## 2021-06-15 NOTE — Telephone Encounter (Signed)
Received fax from [facility] requesting refill on naproxen 500 mg tabs. No longer on current med list.

## 2021-06-15 NOTE — Telephone Encounter (Signed)
I would recommend she call Dr. Prentice Docker office to be seen.  Alternatively, she should come in and see one of [place].  The MRI is difficult to interpret.  She may need to be on a long term neuropathic medication, however, it would not be safe to prescribe over the phone.    Gilles Chiquito, MD

## 2021-06-16 NOTE — Telephone Encounter (Signed)
Called pt - stated Sports med told her to call her doctor for pain med. No available appts this morning. Pt agreed to come Monday - appt schedule w/Dr Elliot Gurney 9/12 @ 1445 PM.

## 2021-06-19 ENCOUNTER — Other Ambulatory Visit: Payer: Self-pay

## 2021-06-19 ENCOUNTER — Ambulatory Visit (INDEPENDENT_AMBULATORY_CARE_PROVIDER_SITE_OTHER): Payer: Medicare Other | Admitting: Internal Medicine

## 2021-06-19 ENCOUNTER — Encounter: Payer: Self-pay | Admitting: Internal Medicine

## 2021-06-19 DIAGNOSIS — M79604 Pain in right leg: Secondary | ICD-10-CM

## 2021-06-19 DIAGNOSIS — R600 Localized edema: Secondary | ICD-10-CM | POA: Diagnosis not present

## 2021-06-19 DIAGNOSIS — M79605 Pain in left leg: Secondary | ICD-10-CM | POA: Diagnosis not present

## 2021-06-19 MED ORDER — MELOXICAM 7.5 MG PO TABS: 7.5000 mg | ORAL_TABLET | Freq: Every day | ORAL | 2 refills | Status: AC

## 2021-06-19 MED ORDER — METHOCARBAMOL 500 MG PO TABS: 500.0000 mg | ORAL_TABLET | Freq: Four times a day (QID) | ORAL | 0 refills | Status: DC | PRN

## 2021-06-19 NOTE — Patient Instructions (Addendum)
Dear [name]  Today we evaluated you for leg pain.  We feel your pain is likely related to nerve irritation by the piriformis muscle in your left buttock.  We recommend stretching, antiinflammatory medication Meloxicam, and muscle relaxers Robaxin. Do NOT take both the Robaxin and the Flexeril at the same time. If your symptoms do not improve, it may be worth while to follow up with the physical therapist to have them help with exercises for the piriformis muscle.   Please follow up in 6 months.

## 2021-06-19 NOTE — Progress Notes (Signed)
   CC: Pain management  HPI:[name] is a 65 y.o. female who presents for evaluation of pain. Please see individual problem based A/P for details.  Please see encounters tab for problem-based charting.  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  She has a prescription for Flexeril 5 mg which she takes at bedtime as needed but has not noticed any significant improvement with this medication.  Wrote prescription for Robaxin.  Recommended patient continue with stretches.        No pain with passive movement, some soft tissue edema left knee, tenderness to palpation medial aspect of knee shin, no crepitus, positive straight leg raise left, decreased strength in quad left. Normal reflexes and negative babiniski  Depression, PHQ-9: Based on the patients  [place] Visit from 10/10/2020 in [place]  PHQ-9 Total Score 1      score we have 1.  Past Medical History:  Diagnosis Date   Adenomatous colon polyp 6/09   Resected on colonoscopy, no high grade dysplasia.    Allergic rhinitis    Bell's palsy 01/2009   Left sided   Chest pain    Exercise stress test negative 6/07   Colon polyps    03/22/2008: adenomatous polyps x3 w no dysplasia. Needs repeat colonoscopy in 3 years   Diabetic peripheral neuropathy (HCC)    External hemorrhoid    GERD (gastroesophageal reflux disease)    Hyperlipidemia    Hypertension    Hypertensive retinopathy    Followed by Dr. Herbert Deaner.   Insomnia    Intolerance, drug    Leg cramps on Lipitor   Lipoma 12/11   Posterior neck.    Osteoarthritis    Carpometacarpal joint of right thumb   Postmenopausal bleeding 9/05   Endometrial biopsy showed  FOCAL TUBAL METAPLASIA    Postmenopausal symptoms    Hot flashes, vaginal dryness, iritability,  difficulty sleeping. as of 2005.   Sebaceous cyst of breast    Has been refered to derm.   Trigger finger    Right thumb   Type II diabetes mellitus (HCC)    Uterine fibroid    Review of Systems:   ROS   Physical Exam: Vitals:   06/19/21 1435  BP: (!) 141/76  Pulse: (!) 108  Temp: 98 F (36.7 C)  TempSrc: Oral  SpO2: 98%  Weight: 157 lb 3.2 oz (71.3 kg)  Height: '5\' 7"'$  (1.702 m)     General: Alert and oriented no acute distress HEENT: Conjunctiva nl , antiicteric sclerae, moist mucous membranes, no exudate or erythema Cardiovascular: Normal rate, regular rhythm.  No murmurs, rubs, or gallops Pulmonary : Equal breath sounds, No wheezes, rales, or rhonchi Abdominal: soft, nontender,  bowel sounds present Ext: No edema or erythema noted of the lower extremity.  Tender to palpation left lower extremity.  No pain with passive movement.  No crepitus, positive straight leg raise left, decreased strength in quad muscle left.  Negative Babinski.  Reflexes intact bilaterally.  No edema in lower extremities, no tenderness to palpation of lower extremities.   Assessment & Plan:   See Encounters Tab for problem based charting.  Patient seen with Dr. Angelia Mould

## 2021-06-20 ENCOUNTER — Encounter: Payer: Self-pay | Admitting: Internal Medicine

## 2021-06-20 NOTE — Assessment & Plan Note (Addendum)
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  She has a prescription for Flexeril 5 mg which she takes at bedtime as needed but has not noticed any significant improvement with this medication.  Wrote prescription for Robaxin.  Also given prescription for meloxicam to assist with any inflammation in the area.  Recommended patient continue with stretches.

## 2021-06-30 ENCOUNTER — Encounter: Payer: Medicare Other | Admitting: Internal Medicine

## 2021-07-03 ENCOUNTER — Other Ambulatory Visit: Payer: Self-pay | Admitting: Student

## 2021-07-03 DIAGNOSIS — G629 Polyneuropathy, unspecified: Secondary | ICD-10-CM

## 2021-07-04 NOTE — Telephone Encounter (Signed)
pregabalin (LYRICA) 50 MG capsule (Expired) REFILL REQUEST @ [id number] - [place], [place] [address].  Per patient she already came in to discuss about getting this medication. Please call pt back.

## 2021-07-04 NOTE — Telephone Encounter (Signed)
Return pt's call. Stated her leg is hurting; difficulty walking. Stated she needs , Celebrex which she had her grandson spelled the name to me - informed her this med is no longer on med list. Also stated she needs this pill for diabetes and pain. I asked if she has AVS from last visit with Dr Elliot Gurney- stated at home; she's currently at her daughter's house. Tried to explain to pt Meloxicam and Robaxin are the 2 new meds, and not to take Flexeril and Robaxin at the same time. She stated she has trouble reading and seeing and her daughter has been trying to help.Daughter is not currently at home. She asked me about a little pill; informed her unsure what med she's preferring to. Stated she will call back tomorrow when she's at home. Pharmacy referral may be helpful. Thanks

## 2021-07-06 ENCOUNTER — Encounter: Payer: Medicare Other | Admitting: Internal Medicine

## 2021-07-12 ENCOUNTER — Ambulatory Visit (INDEPENDENT_AMBULATORY_CARE_PROVIDER_SITE_OTHER): Payer: Medicare Other | Admitting: Student

## 2021-07-12 ENCOUNTER — Ambulatory Visit: Payer: Medicare Other | Admitting: Podiatry

## 2021-07-12 ENCOUNTER — Encounter: Payer: Self-pay | Admitting: Student

## 2021-07-12 ENCOUNTER — Encounter: Payer: Medicare Other | Admitting: Internal Medicine

## 2021-07-12 ENCOUNTER — Other Ambulatory Visit: Payer: Self-pay

## 2021-07-12 VITALS — BP 138/74 | HR 102 | Temp 98.1°F | Resp 18 | Wt 152.0 lb

## 2021-07-12 DIAGNOSIS — D126 Benign neoplasm of colon, unspecified: Secondary | ICD-10-CM

## 2021-07-12 DIAGNOSIS — R630 Anorexia: Secondary | ICD-10-CM | POA: Diagnosis not present

## 2021-07-12 DIAGNOSIS — G5793 Unspecified mononeuropathy of bilateral lower limbs: Secondary | ICD-10-CM | POA: Diagnosis not present

## 2021-07-12 DIAGNOSIS — Z114 Encounter for screening for human immunodeficiency virus [HIV]: Secondary | ICD-10-CM | POA: Diagnosis not present

## 2021-07-12 DIAGNOSIS — R1032 Left lower quadrant pain: Secondary | ICD-10-CM

## 2021-07-12 DIAGNOSIS — E1142 Type 2 diabetes mellitus with diabetic polyneuropathy: Secondary | ICD-10-CM

## 2021-07-12 DIAGNOSIS — E118 Type 2 diabetes mellitus with unspecified complications: Secondary | ICD-10-CM

## 2021-07-12 DIAGNOSIS — Z113 Encounter for screening for infections with a predominantly sexual mode of transmission: Secondary | ICD-10-CM | POA: Diagnosis not present

## 2021-07-12 DIAGNOSIS — R000 Tachycardia, unspecified: Secondary | ICD-10-CM

## 2021-07-12 DIAGNOSIS — Z1321 Encounter for screening for nutritional disorder: Secondary | ICD-10-CM | POA: Diagnosis not present

## 2021-07-12 DIAGNOSIS — N6322 Unspecified lump in the left breast, upper inner quadrant: Secondary | ICD-10-CM

## 2021-07-12 DIAGNOSIS — G629 Polyneuropathy, unspecified: Secondary | ICD-10-CM | POA: Diagnosis not present

## 2021-07-12 DIAGNOSIS — F17210 Nicotine dependence, cigarettes, uncomplicated: Secondary | ICD-10-CM | POA: Diagnosis not present

## 2021-07-12 LAB — GLUCOSE, CAPILLARY: Glucose-Capillary: 279 mg/dL — ABNORMAL HIGH (ref 70–99)

## 2021-07-12 LAB — POCT GLYCOSYLATED HEMOGLOBIN (HGB A1C): Hemoglobin A1C: 9.3 % — AB (ref 4.0–5.6)

## 2021-07-12 NOTE — Progress Notes (Signed)
   CC: Loss of appetite  HPI:  Bridget Owens is a 65 y.o. female with PMH as below who presents to the clinic for evaluation of loss of appetite. Please see problem based charting for evaluation, assessment and plan.  Past Medical History:  Diagnosis Date   Adenomatous colon polyp 6/09   Resected on colonoscopy, no high grade dysplasia.    Allergic rhinitis    Bell's palsy 01/2009   Left sided   Chest pain    Exercise stress test negative 6/07   Colon polyps    03/22/2008: adenomatous polyps x3 w no dysplasia. Needs repeat colonoscopy in 3 years   Diabetic peripheral neuropathy (HCC)    External hemorrhoid    GERD (gastroesophageal reflux disease)    Hyperlipidemia    Hypertension    Hypertensive retinopathy    Followed by Dr. Herbert Deaner.   Insomnia    Intolerance, drug    Leg cramps on Lipitor   Lipoma 12/11   Posterior neck.    Osteoarthritis    Carpometacarpal joint of right thumb   Postmenopausal bleeding 9/05   Endometrial biopsy showed  FOCAL TUBAL METAPLASIA    Postmenopausal symptoms    Hot flashes, vaginal dryness, iritability, difficulty sleeping. as of 2005.   Sebaceous cyst of breast    Has been refered to derm.   Trigger finger    Right thumb   Type II diabetes mellitus (HCC)    Uterine fibroid     Review of Systems:  Constitutional: Negative for fever, night sweats or fatigue. Positive for loss of appetite and unintentional weight loss Eyes: Negative for visual changes Respiratory: Negative for shortness of breath Cardiac: Negative for chest pain or palpitations MSK: Negative for back pain Abdomen: Positive for abdominal pain. Negative for constipation, diarrhea, nausea and vomiting. Neuro: Negative for headache, dizziness or weakness  Physical Exam: General: Pleasant, well developed elderly female. No acute distress. HEENT: MMM. PERRLA. Neck: Supple.  Normal ROM.  No adenopathy or masses. Cardiac: Tachycardic.  Regular rhythm. No murmurs, rubs or  gallops. No LE edema Respiratory: Lungs CTAB. No wheezing or crackles. Abdominal: Soft, symmetric. Mild tenderness to palpation of the LLQ. Nondistended. No rebound or guarding. Normal BS. Skin: Warm, dry and intact without rashes or lesions Extremities: Atraumatic. Full ROM. Palpable radial and DP pulses. Neuro: A&O x 3. Moves all extremities Psych: Appropriate mood and affect.  Vitals:   07/12/21 0928 07/12/21 1001  BP: 138/74   Pulse: (!) 110 (!) 102  Resp: 18   Temp: 98.1 F (36.7 C)   SpO2: 98%   Weight: 152 lb (68.9 kg)      Assessment & Plan:   See Encounters Tab for problem based charting.  Patient discussed with Dr. Emelia Salisbury, MD, MPH

## 2021-07-12 NOTE — Patient Instructions (Signed)
Thank you, [name] for allowing [place] to provide your care today. Today we discussed your loss of appetite, fatigue, diabetes and smoking history.  We have ordered some labs and test to evaluate for cause of her loss of appetite.   I have ordered the following labs for you:  Lab Orders         Glucose, capillary         CMP14 + Anion Gap         CBC with Diff         TSH         POC Hbg A1C      I will call if any are abnormal. All of your labs can be accessed through "My Chart".  I have place a referrals to GI for colonoscopy  I have ordered the following tests: CT chest/abd/pel  My Chart Access: [email or web address]  Please follow-up in 1 month  Please make sure to arrive 15 minutes prior to your next appointment. If you arrive late, you may be asked to reschedule.    We look forward to seeing you next time. Please call our clinic at [phone] if you have any questions or concerns. The best time to call is Monday-Friday from 9am-4pm, but there is someone available 24/7. If after hours or the weekend, call the main hospital number and ask for the Internal Medicine Resident On-Call. If you need medication refills, please notify your pharmacy one week in advance and they will send [place] a request.   Thank you for letting us take part in your care. Wishing you the best!  Lacinda Axon, MD 07/12/2021, 9:59 AM IM Resident, PGY-2 [facility] 41:10

## 2021-07-13 ENCOUNTER — Encounter: Payer: Self-pay | Admitting: Student

## 2021-07-13 DIAGNOSIS — R000 Tachycardia, unspecified: Secondary | ICD-10-CM | POA: Insufficient documentation

## 2021-07-13 DIAGNOSIS — R630 Anorexia: Secondary | ICD-10-CM | POA: Insufficient documentation

## 2021-07-13 HISTORY — DX: Tachycardia, unspecified: R00.0

## 2021-07-13 LAB — CMP14 + ANION GAP
ALT: 18 IU/L (ref 0–32)
AST: 15 IU/L (ref 0–40)
Albumin/Globulin Ratio: 2 (ref 1.2–2.2)
Albumin: 4.5 g/dL (ref 3.8–4.8)
Alkaline Phosphatase: 68 IU/L (ref 44–121)
Anion Gap: 18 mmol/L (ref 10.0–18.0)
BUN/Creatinine Ratio: 17 (ref 12–28)
BUN: 13 mg/dL (ref 8–27)
Bilirubin Total: 0.2 mg/dL (ref 0.0–1.2)
CO2: 22 mmol/L (ref 20–29)
Calcium: 10.1 mg/dL (ref 8.7–10.3)
Chloride: 98 mmol/L (ref 96–106)
Creatinine, Ser: 0.75 mg/dL (ref 0.57–1.00)
Globulin, Total: 2.2 g/dL (ref 1.5–4.5)
Glucose: 242 mg/dL — ABNORMAL HIGH (ref 70–99)
Potassium: 4.1 mmol/L (ref 3.5–5.2)
Sodium: 138 mmol/L (ref 134–144)
Total Protein: 6.7 g/dL (ref 6.0–8.5)
eGFR: 87 mL/min/{1.73_m2} (ref >59)

## 2021-07-13 LAB — CBC WITH DIFFERENTIAL/PLATELET
Basophils Absolute: 0 10*3/uL (ref 0.0–0.2)
Basos: 1 %
EOS (ABSOLUTE): 0.1 10*3/uL (ref 0.0–0.4)
Eos: 1 %
Hematocrit: 44 % (ref 34.0–46.6)
Hemoglobin: 14.7 g/dL (ref 11.1–15.9)
Immature Grans (Abs): 0 10*3/uL (ref 0.0–0.1)
Immature Granulocytes: 0 %
Lymphocytes Absolute: 2.1 10*3/uL (ref 0.7–3.1)
Lymphs: 26 %
MCH: 28.9 pg (ref 26.6–33.0)
MCHC: 33.4 g/dL (ref 31.5–35.7)
MCV: 87 fL (ref 79–97)
Monocytes Absolute: 0.5 10*3/uL (ref 0.1–0.9)
Monocytes: 7 %
Neutrophils Absolute: 5.4 10*3/uL (ref 1.4–7.0)
Neutrophils: 65 %
Platelets: 342 10*3/uL (ref 150–450)
RBC: 5.08 x10E6/uL (ref 3.77–5.28)
RDW: 12 % (ref 11.7–15.4)
WBC: 8.1 10*3/uL (ref 3.4–10.8)

## 2021-07-13 LAB — TSH: TSH: 1.94 u[IU]/mL (ref 0.450–4.500)

## 2021-07-13 MED ORDER — PREGABALIN 50 MG PO CAPS: 50.0000 mg | ORAL_CAPSULE | Freq: Three times a day (TID) | ORAL | 2 refills | Status: DC

## 2021-07-13 NOTE — Assessment & Plan Note (Signed)
Patient continues to complain of lower extremity pain and is described as burning and shooting pains down her legs.  States she was prescribed Lyrica during last visit and help with the pain but it is a prescription for refills.  Lower extremity pain likely neuropathy from uncontrolled diabetes.  We will refill patient's Lyrica and monitor for improvement.  Plan: --Refilled Lyrica 50 mg 3 times daily -- Follow-up in 4 weeks for reevaluation

## 2021-07-13 NOTE — Progress Notes (Signed)
Internal Medicine Clinic Attending  I saw and evaluated the patient.  I personally confirmed the key portions of the history and exam documented by Dr. Gawaluck and I reviewed pertinent patient test results.  The assessment, diagnosis, and plan were formulated together and I agree with the documentation in the resident's note.  

## 2021-07-13 NOTE — Assessment & Plan Note (Addendum)
Patient states her left breast pain has resolved. Ultrasound and diagnostic mammography on 11/21/2020 did not show any mammographic evidence of malignancy.  -- Patient will continue yearly screening mammogram.

## 2021-07-13 NOTE — Assessment & Plan Note (Addendum)
Patient presents today with a complaint of loss of appetite for the last month. States that when she tries to eats, she often spitting out her food due to loss of appetite. Does not endorse any food stuck in the back of her throat, cough or choking sensation. Patient does not have the appetite. She reports night sweats in the past but no night sweats in the last few months. She endorses occasional nausea and abdominal pain in the left lower quadrant that comes and goes and radiates to her vagina but denies any vomiting, vaginal discharge, vaginal bleeding, dysuria, hematuria, shortness of breath, palpitations or chest pain.. She often tries to drink Ensure as a replacement meal. Patient states she has not taking any diabetes medications due to it decreased caloric intake.   A/P: Patient's complaints concerning for possible malignancy. She has lost 20 pounds in the last 3 months. CT chest in 2017 showed pulmonary nodules, biggest measuring 6 mm. Colonoscopy 2013 showed polyps that were removed.  Ultrasound and diagnostic mammography of the breast this year was unremarkable. Patient's risk of malignancy may include age, smoking history, history of polyps in the colon and pulmonary nodules.  CBC, CMP and TSH within normal limits.  Will order imaging and colonoscopy to further evaluate and rule out malignancy. -- CT chest/abdomen/pelvis -- Referred to GI for repeat colonoscopy -- Follow-up in 4 weeks.

## 2021-07-13 NOTE — Assessment & Plan Note (Signed)
Patient found to be tachycardic to 110s initially in clinic. This improved to 102 on repeat at the end of visit. Patient denies any chest pain, shortness of breath, dizziness, palpitations or headaches. On exam, patient has regular rhythm, no murmurs rubs or gallops.  -- EKG if patient still tachycardic at next office visit.

## 2021-07-13 NOTE — Assessment & Plan Note (Addendum)
Patient's A1c has increased to 9.3 today from 6.3 in June. CBG of 279 today. Patient reports that because of her loss of appetite and decrease intake, she has not been taking her diabetes medications. Patient has had unintentional weight loss of around 18 pounds in the last 3 to 4 months. Working up for possible malignancy.  Kidney function stable today.  Plan: --Patient advised to continue taking her metformin 1000 mg twice daily and Ozempic 1 mg weekly since she is not at a significant risk of hypoglycemia with her current antidiabetic regimen. -- Encouraged to continue to drink Ensure or boost if unable to eat. -- Follow-up in 4 weeks.

## 2021-07-13 NOTE — Progress Notes (Signed)
Internal Medicine Clinic Attending  Case discussed with Dr. Amponsah  At the time of the visit.  We reviewed the resident's history and exam and pertinent patient test results.  I agree with the assessment, diagnosis, and plan of care documented in the resident's note.  

## 2021-08-03 ENCOUNTER — Ambulatory Visit
Admission: RE | Admit: 2021-08-03 | Discharge: 2021-08-03 | Disposition: A | Discharge status: Home / Self Care | Payer: Medicare Other | Source: Ambulatory Visit | Attending: Internal Medicine | Admitting: Internal Medicine

## 2021-08-03 DIAGNOSIS — R1032 Left lower quadrant pain: Secondary | ICD-10-CM

## 2021-08-03 DIAGNOSIS — F17210 Nicotine dependence, cigarettes, uncomplicated: Secondary | ICD-10-CM

## 2021-08-03 DIAGNOSIS — D126 Benign neoplasm of colon, unspecified: Secondary | ICD-10-CM

## 2021-08-03 DIAGNOSIS — R630 Anorexia: Secondary | ICD-10-CM

## 2021-08-03 MED ORDER — IOPAMIDOL (ISOVUE-300) INJECTION 61%
100.0000 mL | Freq: Once | INTRAVENOUS | Status: AC | PRN
  Administered 2021-08-03: 100 mL via INTRAVENOUS

## 2021-08-07 ENCOUNTER — Telehealth: Payer: Self-pay

## 2021-08-07 NOTE — Telephone Encounter (Signed)
Please call pt back for test results.

## 2021-08-08 NOTE — Telephone Encounter (Signed)
Patient saw Dr. Coy Saunas on 10/5, CT of chest/abdomen/pelvis ordered which she had done on 10/27. Forwarding to PCP/team , please call patient w/ results. Thank you, SChaplin, RN,BSN

## 2021-08-08 NOTE — Telephone Encounter (Signed)
Called patient to review findings of CT chest/abdomen/pelvis that she had completed on October 27.  CT showed no acute findings in the chest, abdomen or pelvis.  Patient has also not been able to see GI to have colonoscopy scheduled yet.  During her last office visit on October 5, she discussed loss of appetite and pain in her legs.  The pain in her legs has continued and she would like to be seen in the clinic for this.  She also does not have an appetite due pain in legs.  Will send message to front office staff requesting in person visit with Dr. Howie Ill this week if possible.

## 2021-08-08 NOTE — Telephone Encounter (Signed)
Pt is calling again about her results  

## 2021-08-14 LAB — HM DIABETES EYE EXAM

## 2021-08-14 NOTE — Telephone Encounter (Signed)
Attempted to contact patient to schedule a future appointment via telephone.  No answer and voicemail is not set up.  Appointment scheduled for 08/28/2021 at 2:15 pm and mailed to address on file.

## 2021-08-23 ENCOUNTER — Ambulatory Visit (INDEPENDENT_AMBULATORY_CARE_PROVIDER_SITE_OTHER): Payer: Medicare Other | Admitting: Podiatry

## 2021-08-23 ENCOUNTER — Other Ambulatory Visit: Payer: Self-pay

## 2021-08-23 DIAGNOSIS — M21621 Bunionette of right foot: Secondary | ICD-10-CM

## 2021-08-23 DIAGNOSIS — B351 Tinea unguium: Secondary | ICD-10-CM

## 2021-08-23 DIAGNOSIS — E119 Type 2 diabetes mellitus without complications: Secondary | ICD-10-CM

## 2021-08-23 DIAGNOSIS — E1142 Type 2 diabetes mellitus with diabetic polyneuropathy: Secondary | ICD-10-CM

## 2021-08-23 DIAGNOSIS — Q828 Other specified congenital malformations of skin: Secondary | ICD-10-CM

## 2021-08-23 DIAGNOSIS — M79674 Pain in right toe(s): Secondary | ICD-10-CM

## 2021-08-23 DIAGNOSIS — M79675 Pain in left toe(s): Secondary | ICD-10-CM

## 2021-08-23 DIAGNOSIS — M21622 Bunionette of left foot: Secondary | ICD-10-CM

## 2021-08-27 ENCOUNTER — Encounter: Payer: Self-pay | Admitting: Podiatry

## 2021-08-27 NOTE — Progress Notes (Signed)
ANNUAL DIABETIC FOOT EXAM  Subjective: Bridget Owens presents today for for annual diabetic foot examination and painful porokeratotic lesion(s) bilaterally and painful mycotic toenails that limit ambulation. Painful toenails interfere with ambulation. Aggravating factors include wearing enclosed shoe gear. Pain is relieved with periodic professional debridement. Painful porokeratotic lesions are aggravated when weightbearing with and without shoegear. Pain is relieved with periodic professional debridement..  Patient relates 15 year h/o diabetes.  Patient denies nay h/o foot wounds.  Patient has been diagnosed with neuropathy and it is managed with gabapentin.  Patient's blood sugar was "200-something" mg/dl today.   Masters, Joellen Jersey, DO is patient's PCP. Last visit was 07/12/2021.  Past Medical History:  Diagnosis Date   Adenomatous colon polyp 6/09   Resected on colonoscopy, no high grade dysplasia.    Allergic rhinitis    Bell's palsy 01/2009   Left sided   Chest pain    Exercise stress test negative 6/07   Colon polyps    03/22/2008: adenomatous polyps x3 w no dysplasia. Needs repeat colonoscopy in 3 years   Diabetic peripheral neuropathy (HCC)    External hemorrhoid    GERD (gastroesophageal reflux disease)    Hyperlipidemia    Hypertension    Hypertensive retinopathy    Followed by Dr. Herbert Deaner.   Insomnia    Intolerance, drug    Leg cramps on Lipitor   Lipoma 12/11   Posterior neck.    Osteoarthritis    Carpometacarpal joint of right thumb   Postmenopausal bleeding 9/05   Endometrial biopsy showed  FOCAL TUBAL METAPLASIA    Postmenopausal symptoms    Hot flashes, vaginal dryness, iritability, difficulty sleeping. as of 2005.   Sebaceous cyst of breast    Has been refered to derm.   Trigger finger    Right thumb   Type II diabetes mellitus ([place])    Uterine fibroid    Patient Active Problem List   Diagnosis Date Noted   Loss of appetite 07/13/2021   Tachycardia  07/13/2021   Leg pain, bilateral 06/19/2021   Left knee pain 05/21/2021   Mononeuropathy of left lower extremity 04/14/2021   Bacterial sinusitis 02/16/2021   Mass of upper inner quadrant of left breast 10/10/2020   Breast pain, left 09/26/2020   Need for influenza vaccination 07/01/2020   Back pain 03/22/2020   Vulvovaginitis 02/02/2020   Osteoporosis screening 02/02/2020   Non-scarring hair loss 07/27/2019   At risk for polypharmacy 02/27/2018   Viral URI with cough 10/11/2017   Hypertrophic toenail 09/05/2017   Pulmonary fibrosis, unspecified ([place]) 09/05/2016   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   Mixed incontinence urge and stress 12/21/2014   Neuropathy, lower extremity 12/21/2014   Seasonal allergies 10/06/2014   Ear pain, bilateral 05/30/2014   Cigarette smoker 04/03/2013   Health care maintenance 02/03/2013   Brain lipoma, 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 204 each 3   amLODipine (NORVASC) 10 MG tablet Take 1 tablet (10 mg total) by mouth every evening. 30 tablet 11  aspirin 81 MG EC tablet Take 81 mg by mouth daily.       atorvastatin (LIPITOR) 40 MG tablet TAKE 1 TABLET(40 MG) BY MOUTH DAILY 90 tablet 3   Blood Glucose Monitoring Suppl (ACCU-CHEK GUIDE) w/Device KIT USE AS DIRECTED 1 kit 0   cetirizine (ZYRTEC) 5 MG tablet Take 1 tablet (5 mg total) by mouth daily. 30 tablet 5   cyclobenzaprine (FLEXERIL) 5 MG tablet Take 1-2 tabs at bedtime as needed for pain/spasm.  30 tablet 0   glucose blood (ACCU-CHEK GUIDE) test strip CHECK BLOOD SUGAR UP TO TWICE DAILY 200 strip 0   hydrochlorothiazide (HYDRODIURIL) 25 MG tablet Take 1 tablet (25 mg total) by mouth daily. 90 tablet 1   Insulin Pen Needle (PEN NEEDLES) 31G X 5 MM MISC Inject 1 applicator into the skin once a week. diag code E11.42. insulin dependent 30 each 1   meloxicam (MOBIC) 7.5 MG tablet Take 1 tablet (7.5 mg total) by mouth daily. 30 tablet 2   metFORMIN (GLUCOPHAGE) 1000 MG tablet TAKE 1 TABLET(1000 MG) BY MOUTH TWICE DAILY WITH A MEAL (Patient taking differently: Take 1,000 mg by mouth 2 (two) times daily with a meal. TAKE 1 TABLET(1000 MG) BY MOUTH TWICE DAILY WITH A MEAL) 180 tablet 4   methocarbamol (ROBAXIN) 500 MG tablet Take 1 tablet (500 mg total) by mouth every 6 (six) hours as needed for muscle spasms. 30 tablet 0   OZEMPIC, 1 MG/DOSE, 4 MG/3ML SOPN INJECT 1MG INTO THE SKIN ONCE WEEKLY 9 mL 1   pregabalin (LYRICA) 50 MG capsule Take 1 capsule (50 mg total) by mouth 3 (three) times daily. 90 capsule 2   Sodium Chloride-Sodium Bicarb (NETI POT SINUS WASH) 2300-700 MG KIT Use to wash out sinuses twice daily as needed 1 kit 0   No current facility-administered medications on file prior to visit.    Allergies  Allergen Reactions   Codeine Other (See Comments)    "felt funny"   Social History   Occupational History   Not on file  Tobacco Use   Smoking status: Every Day    Packs/day: 0.50    Years: 47.00    Pack years: 23.50    Types: Cigarettes   Smokeless tobacco: Former    Quit date: 11/28/2010   Tobacco comments:    1 PPD  Substance and Sexual Activity   Alcohol use: No    Alcohol/week: 0.0 standard drinks   Drug use: No   Sexual activity: Not on file   Family History  Problem Relation Age of Onset   Pneumonia Mother    Diabetes Mother    Early death Father    Diabetes Sister    Diabetes Brother    Diabetes Maternal Grandmother    Immunization History   Administered Date(s) Administered   Td 05/18/2009     Review of Systems: Negative except as noted in the HPI.   Objective: There were no vitals filed for this visit.  Bridget Owens is a pleasant 65 y.o. female in NAD. AAO X 3.  Vascular Examination: CFT immediate b/l LE. Palpable DP/PT pulses b/l LE. Digital hair absent b/l. Skin temperature gradient WNL b/l. No pain with calf compression b/l. No edema noted b/l. No cyanosis or clubbing noted b/l LE.  Dermatological Examination: Pedal integument with normal turgor, texture and tone BLE. No open wounds b/l LE. No interdigital macerations noted b/l LE. Toenails 1-5 b/l elongated, discolored, dystrophic, thickened, crumbly with subungual debris and tenderness  to dorsal palpation. Porokeratotic lesion(s) submet head 1 right foot, submet head 2 right foot, and submet head 5 b/l. No erythema, no edema, no drainage, no fluctuance.  Musculoskeletal Examination: Muscle strength 5/5 to all lower extremity muscle groups bilaterally. Tailor's bunion deformity noted b/l LE. Utilizes walker for ambulation assistance.  Footwear Assessment: Does the patient wear appropriate shoes? Yes. Does the patient need inserts/orthotics? Yes.  Neurological Examination: Pt has subjective symptoms of neuropathy. Protective sensation decreased with 10 gram monofilament b/l. Vibratory sensation intact b/l.  Hemoglobin A1C Latest Ref Rng & Units 07/12/2021 03/13/2021 10/10/2020  HGBA1C 4.0 - 5.6 % 9.3(A) 6.3(A) 6.4(A)  Some recent data might be hidden   Assessment: 1. Pain due to onychomycosis of toenails of both feet   2. Porokeratosis   3. Tailor's bunion of both feet   4. Diabetic peripheral neuropathy associated with type 2 diabetes mellitus ([place])   5. Encounter for diabetic foot exam ([place])     ADA Risk Categorization: High Risk  Patient has one or more of the following: Loss of protective sensation Absent pedal pulses Severe Foot deformity History of  foot ulcer  Plan: -Examined patient. -Diabetic foot examination performed today. -Continue diabetic foot care principles: inspect feet daily, monitor glucose as recommended by PCP and/or Endocrinologist, and follow prescribed diet per PCP, Endocrinologist and/or dietician. -Mycotic toenails 1-5 bilaterally were debrided in length and girth with sterile nail nippers and dremel without incident. -Painful porokeratotic lesion(s) submet head 1 right foot, submet head 2 right foot, and submet head 5 b/l pared and enucleated with sterile scalpel blade without incident. Total number of lesions debrided=4. -Patient/POA to call should there be question/concern in the interim.  Return in about 3 months (around 11/23/2021).  Marzetta Board, DPM

## 2021-08-28 ENCOUNTER — Other Ambulatory Visit: Payer: Self-pay

## 2021-08-28 ENCOUNTER — Encounter: Payer: Self-pay | Admitting: Internal Medicine

## 2021-08-28 ENCOUNTER — Ambulatory Visit (INDEPENDENT_AMBULATORY_CARE_PROVIDER_SITE_OTHER): Payer: Medicare Other | Admitting: Internal Medicine

## 2021-08-28 ENCOUNTER — Ambulatory Visit (HOSPITAL_COMMUNITY)
Admission: RE | Admit: 2021-08-28 | Discharge: 2021-08-28 | Disposition: A | Discharge status: Home / Self Care | Payer: Medicare Other | Source: Ambulatory Visit | Attending: Internal Medicine | Admitting: Internal Medicine

## 2021-08-28 VITALS — BP 138/82 | HR 100 | Temp 97.4°F | Wt 150.8 lb

## 2021-08-28 DIAGNOSIS — G8929 Other chronic pain: Secondary | ICD-10-CM | POA: Diagnosis not present

## 2021-08-28 DIAGNOSIS — M79604 Pain in right leg: Secondary | ICD-10-CM

## 2021-08-28 DIAGNOSIS — E1142 Type 2 diabetes mellitus with diabetic polyneuropathy: Secondary | ICD-10-CM

## 2021-08-28 DIAGNOSIS — E1141 Type 2 diabetes mellitus with diabetic mononeuropathy: Secondary | ICD-10-CM

## 2021-08-28 DIAGNOSIS — M79605 Pain in left leg: Secondary | ICD-10-CM

## 2021-08-28 DIAGNOSIS — I700 Atherosclerosis of aorta: Secondary | ICD-10-CM | POA: Diagnosis not present

## 2021-08-28 DIAGNOSIS — M25562 Pain in left knee: Secondary | ICD-10-CM

## 2021-08-28 DIAGNOSIS — R630 Anorexia: Secondary | ICD-10-CM

## 2021-08-28 DIAGNOSIS — G603 Idiopathic progressive neuropathy: Secondary | ICD-10-CM

## 2021-08-28 DIAGNOSIS — M25572 Pain in left ankle and joints of left foot: Secondary | ICD-10-CM

## 2021-08-28 DIAGNOSIS — G5792 Unspecified mononeuropathy of left lower limb: Secondary | ICD-10-CM

## 2021-08-28 DIAGNOSIS — E785 Hyperlipidemia, unspecified: Secondary | ICD-10-CM | POA: Diagnosis not present

## 2021-08-28 MED ORDER — ATORVASTATIN CALCIUM 40 MG PO TABS: ORAL_TABLET | ORAL | 3 refills | Status: DC

## 2021-08-28 NOTE — Progress Notes (Signed)
Subjective:  CC: follow-up for loss of appetite and left knee, ankle and leg pain.   HPI:  Bridget Owens is a 65 y.o. female with a past medical history stated below and presents today for  follow-up for loss of appetite and left knee, ankle and leg pain. Please see problem based assessment and plan for additional details.  Past Medical History:  Diagnosis Date   Adenomatous colon polyp 6/09   Resected on colonoscopy, no high grade dysplasia.    Allergic rhinitis    Bell's palsy 01/2009   Left sided   Chest pain    Exercise stress test negative 6/07   Colon polyps    03/22/2008: adenomatous polyps x3 w no dysplasia. Needs repeat colonoscopy in 3 years   Diabetic peripheral neuropathy (HCC)    External hemorrhoid    GERD (gastroesophageal reflux disease)    Hyperlipidemia    Hypertension    Hypertensive retinopathy    Followed by Dr. Herbert Deaner.   Insomnia    Intolerance, drug    Leg cramps on Lipitor   Lipoma 12/11   Posterior neck.    Osteoarthritis    Carpometacarpal joint of right thumb   Postmenopausal bleeding 9/05   Endometrial biopsy showed  FOCAL TUBAL METAPLASIA    Postmenopausal symptoms    Hot flashes, vaginal dryness, iritability, difficulty sleeping. as of 2005.   Sebaceous cyst of breast    Has been refered to derm.   Trigger finger    Right thumb   Type II diabetes mellitus (HCC)    Uterine fibroid     Current Outpatient Medications on File Prior to Visit  Medication Sig Dispense Refill   Accu-Chek FastClix Lancets MISC Check up to two times a day. 204 each 3   amLODipine (NORVASC) 10 MG tablet Take 1 tablet (10 mg total) by mouth every evening. 30 tablet 11   aspirin 81 MG EC tablet Take 81 mg by mouth daily.       Blood Glucose Monitoring Suppl (ACCU-CHEK GUIDE) w/Device KIT USE AS DIRECTED 1 kit 0   cetirizine (ZYRTEC) 5 MG tablet Take 1 tablet (5 mg total) by mouth daily. 30 tablet 5   glucose blood (ACCU-CHEK GUIDE) test strip CHECK BLOOD  SUGAR UP TO TWICE DAILY 200 strip 0   hydrochlorothiazide (HYDRODIURIL) 25 MG tablet Take 1 tablet (25 mg total) by mouth daily. 90 tablet 1   Insulin Pen Needle (PEN NEEDLES) 31G X 5 MM MISC Inject 1 applicator into the skin once a week. diag code E11.42. insulin dependent 30 each 1   meloxicam (MOBIC) 7.5 MG tablet Take 1 tablet (7.5 mg total) by mouth daily. 30 tablet 2   metFORMIN (GLUCOPHAGE) 1000 MG tablet TAKE 1 TABLET(1000 MG) BY MOUTH TWICE DAILY WITH A MEAL (Patient taking differently: Take 1,000 mg by mouth 2 (two) times daily with a meal. TAKE 1 TABLET(1000 MG) BY MOUTH TWICE DAILY WITH A MEAL) 180 tablet 4   pregabalin (LYRICA) 50 MG capsule Take 1 capsule (50 mg total) by mouth 3 (three) times daily. 90 capsule 2   Sodium Chloride-Sodium Bicarb (NETI POT SINUS WASH) 2300-700 MG KIT Use to wash out sinuses twice daily as needed 1 kit 0   No current facility-administered medications on file prior to visit.    Family History  Problem Relation Age of Onset   Pneumonia Mother    Diabetes Mother    Early death Father    Diabetes Sister  Diabetes Brother    Diabetes Maternal Grandmother     Social History   Socioeconomic History   Marital status: Single    Spouse name: Not on file   Number of children: Not on file   Years of education: Not on file   Highest education level: Not on file  Occupational History   Not on file  Tobacco Use   Smoking status: Every Day    Packs/day: 0.50    Years: 47.00    Pack years: 23.50    Types: Cigarettes   Smokeless tobacco: Former    Quit date: 11/28/2010   Tobacco comments:    1 PPD  Substance and Sexual Activity   Alcohol use: No    Alcohol/week: 0.0 standard drinks   Drug use: No   Sexual activity: Not on file  Other Topics Concern   Not on file  Social History Narrative   Not on file   Social Determinants of Health   Financial Resource Strain: Not on file  Food Insecurity: Not on file  Transportation Needs: Not on  file  Physical Activity: Not on file  Stress: Not on file  Social Connections: Not on file  Intimate Partner Violence: Not on file    Review of Systems: ROS negative except for what is noted on the assessment and plan.  Objective:   Vitals:   08/28/21 1431  BP: 138/82  Pulse: 100  Temp: (!) 97.4 F (36.3 C)  TempSrc: Oral  SpO2: 100%  Weight: 150 lb 12.8 oz (68.4 kg)    Physical Exam: Gen: A&O x3 and in no apparent distress, well appearing and nourished. Gait is antalgic with using rolling walker. HEENT:    Head - normocephalic, atraumatic.    Eye - visual acuity grossly intact, conjunctiva clear, sclera non-icteric, EOM intact.    Mouth - No obvious caries or periodontal disease. Neck: no masses or nodules, AROM intact. CV: RRR, no murmurs, S1/S2 presents  Resp: Clear to ascultation bilaterally, normal pulmonary effort  Abd: BS (+) x4, soft, non-tender abdomen, without hepatosplenomegaly or masses MSK: tenderness to palpation of anterior portion of left thigh, tenderness to palpation of left knee joint, pain with valgus and varus stress test, tenderness along posterior aspect of ankle medial to achilles tendon, Grossly normal AROM and strength x4 extremities, sensation intact in dermatomes L4,L5, and S1 Skin: good skin turgor, no rashes, unusual bruising, or prominent lesions.  Neuro: No focal deficits, grossly normal sensation and coordination.  Psych: Oriented x3 and responding appropriately. Intact memory, normal mood, judgement, affect, and insight.    Assessment & Plan:  See Encounters Tab for problem based charting.  Patient seen with Dr. Juluis Rainier Marylou Wages, D.O. [facility]  PGY-1 Pager: [phone]  Phone: [phone] Date 08/29/2021  Time 8:15 AM

## 2021-08-28 NOTE — Patient Instructions (Addendum)
[name], it was a pleasure seeing you today!  Today we discussed: Left leg pain Today we talked about the pain you are having in your left leg.  I would like you to get some x-rays on your left and left ankle.  I am also referring you to neurology. They can help [place] understand why you are having nerve pain in so many different places. I am also getting a blood test to check to Vitamin B12, I will call you with those results.  Loss of appetite Please continue holding Ozempic for now and follow-up in one month to see if your nausea has improved.   I have ordered the following labs today:  Lab Orders         Vitamin B12        Referrals ordered today:   Referral Orders         Ambulatory referral to Neurology       I have ordered the following medication/changed the following medications:   Stop the following medications: Medications Discontinued During This Encounter  Medication Reason   methocarbamol (ROBAXIN) 500 MG tablet Completed Course   cyclobenzaprine (FLEXERIL) 5 MG tablet Completed Course   atorvastatin (LIPITOR) 40 MG tablet Reorder   OZEMPIC, 1 MG/DOSE, 4 MG/3ML SOPN Change in therapy     Start the following medications: Meds ordered this encounter  Medications   atorvastatin (LIPITOR) 40 MG tablet    Sig: TAKE 1 TABLET(40 MG) BY MOUTH DAILY    Dispense:  90 tablet    Refill:  3     Follow-up:  1 month    Please make sure to arrive 15 minutes prior to your next appointment. If you arrive late, you may be asked to reschedule.   We look forward to seeing you next time. Please call our clinic at [phone] if you have any questions or concerns. The best time to call is Monday-Friday from 9am-4pm, but there is someone available 24/7. If after hours or the weekend, call the main hospital number and ask for the Internal Medicine Resident On-Call. If you need medication refills, please notify your pharmacy one week in advance and they will send [place] a  request.  Thank you for letting us take part in your care. Wishing you the best!  Thank you, Dr. [facility]

## 2021-08-29 ENCOUNTER — Telehealth: Payer: Self-pay

## 2021-08-29 DIAGNOSIS — I700 Atherosclerosis of aorta: Secondary | ICD-10-CM | POA: Insufficient documentation

## 2021-08-29 DIAGNOSIS — M25572 Pain in left ankle and joints of left foot: Secondary | ICD-10-CM | POA: Insufficient documentation

## 2021-08-29 LAB — VITAMIN B12: Vitamin B-12: 482 pg/mL (ref 232–1245)

## 2021-08-29 NOTE — Addendum Note (Signed)
Addended by: Edwyna Perfect on: 08/29/2021 02:27 PM   Modules accepted: Orders

## 2021-08-29 NOTE — Assessment & Plan Note (Addendum)
Patient states that she has had left ankle pain that started around the time of a fall last year.  She states she takes NSAIDs occasionally, but they do not help with pain.    Physical exam shows no swelling of left ankle, tenderness to palpation along medial aspect of achilles tendon, full range of motion in dorsiflexion and plantarflexion, muscle strength is 5/5.  Plan: -X-ray of left ankle showed no acute findings and small plantar calcaneal spur and Achilles tendon enthesophyte.

## 2021-08-29 NOTE — Telephone Encounter (Signed)
I have sent a community message to [employer] for a Rollator Walker with a seat [place], [place] C11/22/20229:20 AM

## 2021-08-29 NOTE — Assessment & Plan Note (Addendum)
Patient states that she continues to have pain in her legs bilaterally that began after fall last year at [employer].  The pain is located in left hip, left femur, and in feet bilaterally.  She describes pain as burning sensation, like something is stinging her. Lumbar MRI completed 08/22 was limited by motion artifact, but no spinal canal stenosis or neural foraminal narrowing.      On physical exam, patient ambulates with rolling walker with antalgic gait. She has tenderness to palpation of left anterior portion of thigh, no muscle tightness appreciated, strength 5/5 in hip flexion and extension.  Sensation intact to dermatomes L4, L5, and S1.  Assessment: Patient presents with continued leg pain following fall last year.  She was evaluated by sports medicine and MRI showed no signs concerning for disc herniation.  Patient has been taking Lyrica 50 mg BID since 07/22 due to pain in legs without relief.  Pain sounds neuropathic in origin with burning quality description.   Plan: -Vitamin B12 within normal limits -Referral to neurology -Continue Lyrica 50 mg TID, patient has been  Taking two times daily without relief. Let her know she could take it up to three times daily

## 2021-08-29 NOTE — Progress Notes (Signed)
Internal Medicine Clinic Attending  I saw and evaluated the patient.  I personally confirmed the key portions of the history and exam documented by Dr. Masters and I reviewed pertinent patient test results.  The assessment, diagnosis, and plan were formulated together and I agree with the documentation in the resident's note.  

## 2021-08-29 NOTE — Assessment & Plan Note (Signed)
Patient currently holding Ozempic due to nausea and weight loss.  Will consider adding other medications at next office visit.  -follow-up in four weeks

## 2021-08-29 NOTE — Assessment & Plan Note (Signed)
Patients states that she has continued to have decreased appetite secondary to being nauseated after she eats.  She reports that this has been happening since the beginning of the summer.  CT chest/ abd/ pelvis completed in Oct and was negative for acute findings at that time.  She was referred to [facility] gastroenterology for colonoscopy (last in 2013). CMP, CBC, and TSH within normal limits in Oct.    Assessment: On chart review, patient began to have weight loss around June of this year.  She has lost about 26 lbs since then.  Around that time Ozempic was also increased in dosage.  Symptoms of nausea and weight loss could be secondary to side effects from ozempic.   Differentials include side effects from Ozempic vs gastroparesis from Diabetes (last A1c 9.3 10/22). CT chest/abd/ pelvis completed with normal CBC, CMP, and TSH at last office visit.  Plan: -Hold Ozempic -Follow-up in 1 month

## 2021-08-29 NOTE — Assessment & Plan Note (Addendum)
Aortic atherosclerosis visualized on CT abdomen pelvis and October 2022. Patient states that she previously has taken atorvastatin, but has not had the medication for several months.  Lab Results  Component Value Date   CHOL 152 03/13/2021   HDL 46 03/13/2021   LDLCALC 90 03/13/2021   TRIG 83 03/13/2021   CHOLHDL 3.3 03/13/2021   -refilled atorvastatin 40 mg

## 2021-08-29 NOTE — Assessment & Plan Note (Addendum)
Patient presents with continued left knee pain.  She states that it swells almost everyday and she has pain when she puts weight on it. She fell a year ago in [place].  No recent history of falling  In last month.  She was referred to sports medicine for left leg pain and weakness and completed several sessions of physical therapy.  She states that she did not find this helpful.    Physical exam with no warmth palpated of left knee, no effusion present, tenderness to palpation along joint line, tenderness with valgus and varus stress testing, 5/5 strength in knee flexion and extension.  Assessment: No prior imaging seen for left knee following fall one year ago.  Patient continues to have tenderness to palpation of left knee. Differentials include MCL or LCL tear vs osteoarthritis of knee vs meniscal tear.  Plan: -Left knee x-ray to evaluate for OA showed mild osteoarthritis with mild medial tibiofemoral joint space narrowing.   ADDENDUM 08/29/21: Order placed for home health PT after calling and updating patient.  She states that she had difficulty over the summer with transportation to PT for her back and navigating the bus with a walker is difficult.

## 2021-08-30 ENCOUNTER — Telehealth: Payer: Self-pay | Admitting: Internal Medicine

## 2021-08-30 NOTE — Telephone Encounter (Signed)
Called and updated patient on lab and xray results.

## 2021-09-06 ENCOUNTER — Telehealth: Payer: Self-pay

## 2021-09-06 NOTE — Telephone Encounter (Signed)
[facility] is at Pt home she is requesting a call back .. she stated that Pt blood sugar is over 300 at the moment .   RN .Marland Kitchenceilia  [phone]... Rn stated that she is about to leave and she is requesting verbal orders for patients PT

## 2021-09-07 ENCOUNTER — Ambulatory Visit (INDEPENDENT_AMBULATORY_CARE_PROVIDER_SITE_OTHER): Payer: Medicare Other | Admitting: Internal Medicine

## 2021-09-07 ENCOUNTER — Other Ambulatory Visit (HOSPITAL_COMMUNITY)
Admission: RE | Admit: 2021-09-07 | Discharge: 2021-09-07 | Disposition: A | Discharge status: Home / Self Care | Payer: Medicare Other | Source: Ambulatory Visit | Attending: Internal Medicine | Admitting: Internal Medicine

## 2021-09-07 ENCOUNTER — Encounter: Payer: Self-pay | Admitting: Internal Medicine

## 2021-09-07 ENCOUNTER — Other Ambulatory Visit: Payer: Self-pay

## 2021-09-07 VITALS — BP 138/71 | HR 89 | Temp 97.8°F | Ht 67.0 in | Wt 150.1 lb

## 2021-09-07 DIAGNOSIS — N898 Other specified noninflammatory disorders of vagina: Secondary | ICD-10-CM | POA: Diagnosis present

## 2021-09-07 DIAGNOSIS — N760 Acute vaginitis: Secondary | ICD-10-CM

## 2021-09-07 DIAGNOSIS — R300 Dysuria: Secondary | ICD-10-CM

## 2021-09-07 DIAGNOSIS — E1142 Type 2 diabetes mellitus with diabetic polyneuropathy: Secondary | ICD-10-CM

## 2021-09-07 MED ORDER — OZEMPIC (0.25 OR 0.5 MG/DOSE) 2 MG/1.5ML ~~LOC~~ SOPN: 0.5000 mg | PEN_INJECTOR | SUBCUTANEOUS | 2 refills | Status: DC

## 2021-09-07 NOTE — Progress Notes (Signed)
Subjective:  CC: Vaginal pruritis  HPI:  Bridget Owens is a 65 y.o. female with a past medical history stated below and presents today for vaginal pruritis. Please see problem based assessment and plan for additional details.  Past Medical History:  Diagnosis Date   Adenomatous colon polyp 6/09   Resected on colonoscopy, no high grade dysplasia.    Allergic rhinitis    Bell's palsy 01/2009   Left sided   Chest pain    Exercise stress test negative 6/07   Colon polyps    03/22/2008: adenomatous polyps x3 w no dysplasia. Needs repeat colonoscopy in 3 years   Diabetic peripheral neuropathy (HCC)    External hemorrhoid    GERD (gastroesophageal reflux disease)    Hyperlipidemia    Hypertension    Hypertensive retinopathy    Followed by Dr. Herbert Deaner.   Insomnia    Intolerance, drug    Leg cramps on Lipitor   Lipoma 12/11   Posterior neck.    Osteoarthritis    Carpometacarpal joint of right thumb   Postmenopausal bleeding 9/05   Endometrial biopsy showed  FOCAL TUBAL METAPLASIA    Postmenopausal symptoms    Hot flashes, vaginal dryness, iritability, difficulty sleeping. as of 2005.   Sebaceous cyst of breast    Has been refered to derm.   Trigger finger    Right thumb   Type II diabetes mellitus (HCC)    Uterine fibroid     Current Outpatient Medications on File Prior to Visit  Medication Sig Dispense Refill   Accu-Chek FastClix Lancets MISC Check up to two times a day. 204 each 3   amLODipine (NORVASC) 10 MG tablet Take 1 tablet (10 mg total) by mouth every evening. 30 tablet 11   aspirin 81 MG EC tablet Take 81 mg by mouth daily.       atorvastatin (LIPITOR) 40 MG tablet TAKE 1 TABLET(40 MG) BY MOUTH DAILY 90 tablet 3   Blood Glucose Monitoring Suppl (ACCU-CHEK GUIDE) w/Device KIT USE AS DIRECTED 1 kit 0   cetirizine (ZYRTEC) 5 MG tablet Take 1 tablet (5 mg total) by mouth daily. 30 tablet 5   glucose blood (ACCU-CHEK GUIDE) test strip CHECK BLOOD SUGAR UP TO  TWICE DAILY 200 strip 0   hydrochlorothiazide (HYDRODIURIL) 25 MG tablet Take 1 tablet (25 mg total) by mouth daily. 90 tablet 1   Insulin Pen Needle (PEN NEEDLES) 31G X 5 MM MISC Inject 1 applicator into the skin once a week. diag code E11.42. insulin dependent 30 each 1   meloxicam (MOBIC) 7.5 MG tablet Take 1 tablet (7.5 mg total) by mouth daily. 30 tablet 2   metFORMIN (GLUCOPHAGE) 1000 MG tablet TAKE 1 TABLET(1000 MG) BY MOUTH TWICE DAILY WITH A MEAL (Patient taking differently: Take 1,000 mg by mouth 2 (two) times daily with a meal. TAKE 1 TABLET(1000 MG) BY MOUTH TWICE DAILY WITH A MEAL) 180 tablet 4   pregabalin (LYRICA) 50 MG capsule Take 1 capsule (50 mg total) by mouth 3 (three) times daily. 90 capsule 2   Sodium Chloride-Sodium Bicarb (NETI POT SINUS WASH) 2300-700 MG KIT Use to wash out sinuses twice daily as needed 1 kit 0   No current facility-administered medications on file prior to visit.    Family History  Problem Relation Age of Onset   Pneumonia Mother    Diabetes Mother    Early death Father    Diabetes Sister    Diabetes Brother  Diabetes Maternal Grandmother     Social History   Socioeconomic History   Marital status: Single    Spouse name: Not on file   Number of children: Not on file   Years of education: Not on file   Highest education level: Not on file  Occupational History   Not on file  Tobacco Use   Smoking status: Every Day    Packs/day: 0.50    Years: 47.00    Pack years: 23.50    Types: Cigarettes   Smokeless tobacco: Former    Quit date: 11/28/2010   Tobacco comments:    1 PPD  Substance and Sexual Activity   Alcohol use: No    Alcohol/week: 0.0 standard drinks   Drug use: No   Sexual activity: Not on file  Other Topics Concern   Not on file  Social History Narrative   Not on file   Social Determinants of Health   Financial Resource Strain: Not on file  Food Insecurity: Not on file  Transportation Needs: Not on file   Physical Activity: Not on file  Stress: Not on file  Social Connections: Not on file  Intimate Partner Violence: Not on file    Review of Systems: ROS negative except for what is noted on the assessment and plan.  Objective:   Vitals:   09/07/21 1321  BP: 138/71  Pulse: 89  Temp: 97.8 F (36.6 C)  TempSrc: Oral  SpO2: 99%  Weight: 150 lb 1.6 oz (68.1 kg)  Height: 5' 7"  (1.702 m)    Physical Exam: Gen: A&O x3 and in no apparent distress, well appearing and nourished. Neck: no masses or nodules, AROM intact. CV: RRR, no murmurs, S1/S2 presents  Resp: Clear to ascultation bilaterally  Abd: BS (+) x4, soft, non-tender abdomen, without hepatosplenomegaly or masses MSK: Grossly normal AROM and strength x4 extremities. Skin: good skin turgor, no rashes, unusual bruising, or prominent lesions.   GU: dry and mildly atrophic external genitalia, no obvious rash or lesions. Creamy white discharge noted on vaginal exam.   Assessment & Plan:  See Encounters Tab for problem based charting.  Patient discussed with Dr.  Lacretia Nicks, D.O. [facility]  PGY-3 Pager: [phone]  Phone: [phone] Date 09/08/2021  Time 5:29 AM

## 2021-09-07 NOTE — Telephone Encounter (Signed)
Return call to pt - who stated PT had called about her BS being high. Pt stated she ate something sweet and did not drink any water. Stated BS went down to 177 and she feels "fine".  She checked her BS while on the phone w/me - it is 277. C/o vaginal itching - requesting an appt. Call transferred to front office - appt schedule w/Dr Marianna Payment today 09/07/21  @ 1315 PM.  Vinetta Bergamo PT with [facility] - stated she saw pt yesterday for intake visit. BS was high. Requesting verbal order for "PT once a week x 9 weeks". VO given - if not appropriate let me know. Also informed pt has an appt today.

## 2021-09-07 NOTE — Telephone Encounter (Signed)
Sounds good. Thank you

## 2021-09-07 NOTE — Patient Instructions (Addendum)
.  Thank you, [name] for allowing [place] to provide your care today. Today we discussed pain with urination and high blood sugar.    Labs/Tests Ordered:  Lab Orders         Culture, Urine         Urinalysis, Reflex Microscopic      Referrals Ordered:  Referral Orders  No referral(s) requested today     Medication Changes:  There are no discontinued medications.   Meds ordered this encounter  Medications   Semaglutide,0.25 or 0.5MG /DOS, (OZEMPIC, 0.25 OR 0.5 MG/DOSE,) 2 MG/1.5ML SOPN    Sig: Inject 0.5 mg into the skin once a week.    Dispense:  1.5 mL    Refill:  2     Health Maintenance Screening: Diabetes Health Maintenance Due  Topic Date Due   URINE MICROALBUMIN  05/30/2021   OPHTHALMOLOGY EXAM  10/05/2021   HEMOGLOBIN A1C  10/12/2021   LIPID PANEL  03/13/2022   FOOT EXAM  08/23/2022     Instructions:  - You can use over the counter moisturizers for vaginal dryness  Follow up:  1 month (in January)    Remember: If you have any questions or concerns, call our clinic at [phone] or after hours call [phone] and ask for the internal medicine resident on call.  Marianna Payment, D.O. [place]

## 2021-09-08 ENCOUNTER — Encounter: Payer: Self-pay | Admitting: Internal Medicine

## 2021-09-08 LAB — CERVICOVAGINAL ANCILLARY ONLY
Bacterial Vaginitis (gardnerella): NEGATIVE
Candida Glabrata: NEGATIVE
Candida Vaginitis: NEGATIVE
Chlamydia: NEGATIVE
Comment: NEGATIVE
Comment: NEGATIVE
Comment: NEGATIVE
Comment: NEGATIVE
Comment: NEGATIVE
Comment: NORMAL
Neisseria Gonorrhea: NEGATIVE
Trichomonas: NEGATIVE

## 2021-09-08 NOTE — Assessment & Plan Note (Signed)
HPI: Patient presents with a 7-14 day history of vaginal pruritis. She states that she has associated dysuria without hematuria. She is unable to provide details regarding discharge. She denies recent sexual intercourse, systemic signs of infection or previous STIs. She does, however, have a history of vaginitis 2/2 to BV several years ago.   Pelvic exam was performed showing some evidence of vaginal atrophy around the external genitalia. Additionally, she had white discharge present.   Assessment/Plan: Vulvovaginitis: - Wet prep testing for BV, candida, trich, GC, and chlamydia - UA and Culture

## 2021-09-08 NOTE — Addendum Note (Signed)
Addended by: Truddie Crumble on: 09/08/2021 09:39 AM   Modules accepted: Orders

## 2021-09-08 NOTE — Assessment & Plan Note (Signed)
HPI: Patient presents for further evaluation and management of DM. Patient was found to have elevated CBGs at home after stopping Ozempic. During her last visit it was highlighted that she stopped due to intolerance. She states that it was because she was experiencing weight loss. She is only taking metformin currently.    Assessment/Plan: - Will restart Ozempic at a reduced dose of 0.5 mg weekly. - 1 month f/u

## 2021-09-08 NOTE — Progress Notes (Signed)
Internal Medicine Clinic Attending  Case discussed with Dr. Coe  At the time of the visit.  We reviewed the resident's history and exam and pertinent patient test results.  I agree with the assessment, diagnosis, and plan of care documented in the resident's note.  

## 2021-09-12 ENCOUNTER — Encounter: Payer: Self-pay | Admitting: Internal Medicine

## 2021-09-12 NOTE — Progress Notes (Signed)
Attempted to call patient regarding her lab results, but VM was full. Sent letter with results.

## 2021-10-12 ENCOUNTER — Telehealth: Payer: Self-pay

## 2021-10-12 NOTE — Telephone Encounter (Signed)
Kendra with [facility] want to informed the office that pt would like to be discharged from [facility] PT, pt states she is having too much pain.

## 2021-10-17 ENCOUNTER — Other Ambulatory Visit: Payer: Self-pay

## 2021-10-17 ENCOUNTER — Ambulatory Visit (HOSPITAL_COMMUNITY)
Admission: RE | Admit: 2021-10-17 | Discharge: 2021-10-17 | Disposition: A | Discharge status: Home / Self Care | Payer: Medicare Other | Source: Ambulatory Visit | Attending: Internal Medicine | Admitting: Internal Medicine

## 2021-10-17 ENCOUNTER — Ambulatory Visit (INDEPENDENT_AMBULATORY_CARE_PROVIDER_SITE_OTHER): Payer: Medicare Other | Admitting: Internal Medicine

## 2021-10-17 ENCOUNTER — Encounter: Payer: Self-pay | Admitting: Internal Medicine

## 2021-10-17 VITALS — BP 148/85 | HR 91 | Temp 98.4°F | Ht 67.0 in | Wt 153.1 lb

## 2021-10-17 DIAGNOSIS — R0781 Pleurodynia: Secondary | ICD-10-CM | POA: Diagnosis present

## 2021-10-17 DIAGNOSIS — N760 Acute vaginitis: Secondary | ICD-10-CM

## 2021-10-17 DIAGNOSIS — Z0000 Encounter for general adult medical examination without abnormal findings: Secondary | ICD-10-CM

## 2021-10-17 DIAGNOSIS — R10816 Epigastric abdominal tenderness: Secondary | ICD-10-CM | POA: Insufficient documentation

## 2021-10-17 MED ORDER — FLUCONAZOLE 150 MG PO TABS: 150.0000 mg | ORAL_TABLET | Freq: Once | ORAL | 0 refills | Status: AC

## 2021-10-17 MED ORDER — LIDOCAINE 5 % EX PTCH: 1.0000 | MEDICATED_PATCH | Freq: Two times a day (BID) | CUTANEOUS | 0 refills | Status: AC

## 2021-10-17 NOTE — Assessment & Plan Note (Addendum)
Patient returns for OV for persistent vulvovaginal symptoms.  At previous office visit patient was endorsing pruritus, dysuria.  Patient endorses persistence of the symptoms with white/brown vaginal discharge and burning pain.  Denies sexual intercourse in the last several years.  Pelvic exam performed by Dr. Marianna Payment at last appointment showed evidence of vaginal atrophy, white discharge.  Unfortunately wet prep testing negative for BV/Candida/trichomoniasis.   On assessment, despite negative wet prep, given current symptoms, it is likely Bridget Owens has a yeast infection. UTI considered though sx have been present for several weeks and no suprapubic tenderness.  We will treat for Candida today.  Patient encouraged to call if she has persistence of symptoms.  Plan: -Fluconazole 150 mg once -Encouraged to call if she is having persistence of symptoms

## 2021-10-17 NOTE — Assessment & Plan Note (Addendum)
Patient presented with 1 week history of "burning stomach pain".  Points to epigastrium, endorses feeling knot, describes the pain as burning and radiating from upper stomach to her back.  Pain is worse with coughing, lying down at night, walking.  No change in pain with meals, positions.  Patient denies nausea/vomiting, diarrhea, constipation, black stools, hematochezia, fevers, chills.  On exam, patient initially tender to touch epigastrium then right epigastrium.  On palpation of right lower ribs patient became acutely tender, swelling appreciated right side compared to left.  Denies recent injury or fall.  On assessment, initial differential diagnosis included gastritis, PUD, GERD, abdominal hernia, herpes zoster rash however after identifying tenderness of midclavicular right lower ribs with fullness, more likely patient has fracture of unclear etiology, or costochondritis.  Plan: -X-ray right ribs -Lidocaine patch twice daily -Routine follow-up 1 month, encouraged to call for earlier appointment if needed  ADDENDUM: Xray without signs of fracture or other osseous lesion

## 2021-10-17 NOTE — Assessment & Plan Note (Signed)
DEXA scan ordered today.  At next follow-up, please address vaccinations, hemoglobin A1c, urine microalbumin, ophthalmology exam.

## 2021-10-17 NOTE — Progress Notes (Signed)
Internal Medicine Clinic Attending  I saw and evaluated the patient.  I personally confirmed the key portions of the history and exam documented by Dr. Vinetta Bergamo and I reviewed pertinent patient test results.  The assessment, diagnosis, and plan were formulated together and I agree with the documentation in the residents note.  Tenderness is localized in mid clavicular line lower ribs, and subtle fullness is palpable in the area.  SHe has NO tenderness in the RUQ.  Patient indicates a distribution of pain from her R flank along lower R ribs to her front, which in the appropriate clinical setting could be in a zoster distribution, though no rash.  The persistent vaginal discharge and burning (sounds more external as opposed to urethral or bladder dysuria) is unclear, though she may need treatment for her vaginal atrophy seen on exam recently, particularly if empiric treatment for yeast is unsuccessful.

## 2021-10-17 NOTE — Patient Instructions (Signed)
Thank you, [name] for allowing [place] to provide your care today. Today we discussed:  Rib pain: We have ordered an x-ray for you.  We have also ordered a lidocaine patch.  You may place the patch on your painful spot twice a day.  Vaginal discharge: I have ordered fluconazole 150 mg once.  If you are not having an improvement in symptoms after taking this medication, call our office back.   My Chart Access: [email or web address]  Please follow-up in 1 month for routine health check up, or call sooner if needed.  Please make sure to arrive 15 minutes prior to your next appointment. If you arrive late, you may be asked to reschedule.    We look forward to seeing you next time. Please call our clinic at [phone] if you have any questions or concerns. The best time to call is Monday-Friday from 9am-4pm, but there is someone available 24/7. If after hours or the weekend, call the main hospital number and ask for the Internal Medicine Resident On-Call. If you need medication refills, please notify your pharmacy one week in advance and they will send [place] a request.   Thank you for letting us take part in your care. Wishing you the best!  Wayland Denis, MD 10/17/2021, 10:31 AM IM Resident, PGY-1

## 2021-10-17 NOTE — Progress Notes (Signed)
CC: abdominal pain, persistent vaginal discharge  HPI:  Bridget Owens is a 66 y.o. female with a past medical history stated below and presents today for abdominal pain, persistent vaginal discharge. Please see problem based assessment and plan for additional details.  Past Medical History:  Diagnosis Date   Adenomatous colon polyp 6/09   Resected on colonoscopy, no high grade dysplasia.    Allergic rhinitis    Bell's palsy 01/2009   Left sided   Chest pain    Exercise stress test negative 6/07   Colon polyps    03/22/2008: adenomatous polyps x3 w no dysplasia. Needs repeat colonoscopy in 3 years   Diabetic peripheral neuropathy (HCC)    External hemorrhoid    GERD (gastroesophageal reflux disease)    Hyperlipidemia    Hypertension    Hypertensive retinopathy    Followed by Dr. Herbert Deaner.   Insomnia    Intolerance, drug    Leg cramps on Lipitor   Lipoma 12/11   Posterior neck.    Osteoarthritis    Carpometacarpal joint of right thumb   Postmenopausal bleeding 9/05   Endometrial biopsy showed  FOCAL TUBAL METAPLASIA    Postmenopausal symptoms    Hot flashes, vaginal dryness, iritability, difficulty sleeping. as of 2005.   Sebaceous cyst of breast    Has been refered to derm.   Trigger finger    Right thumb   Type II diabetes mellitus (HCC)    Uterine fibroid     Current Outpatient Medications on File Prior to Visit  Medication Sig Dispense Refill   Accu-Chek FastClix Lancets MISC Check up to two times a day. 204 each 3   amLODipine (NORVASC) 10 MG tablet Take 1 tablet (10 mg total) by mouth every evening. 30 tablet 11   aspirin 81 MG EC tablet Take 81 mg by mouth daily.       atorvastatin (LIPITOR) 40 MG tablet TAKE 1 TABLET(40 MG) BY MOUTH DAILY 90 tablet 3   Blood Glucose Monitoring Suppl (ACCU-CHEK GUIDE) w/Device KIT USE AS DIRECTED 1 kit 0   cetirizine (ZYRTEC) 5 MG tablet Take 1 tablet (5 mg total) by mouth daily. 30 tablet 5   glucose blood (ACCU-CHEK GUIDE)  test strip CHECK BLOOD SUGAR UP TO TWICE DAILY 200 strip 0   hydrochlorothiazide (HYDRODIURIL) 25 MG tablet Take 1 tablet (25 mg total) by mouth daily. 90 tablet 1   Insulin Pen Needle (PEN NEEDLES) 31G X 5 MM MISC Inject 1 applicator into the skin once a week. diag code E11.42. insulin dependent 30 each 1   meloxicam (MOBIC) 7.5 MG tablet Take 1 tablet (7.5 mg total) by mouth daily. 30 tablet 2   metFORMIN (GLUCOPHAGE) 1000 MG tablet TAKE 1 TABLET(1000 MG) BY MOUTH TWICE DAILY WITH A MEAL (Patient taking differently: Take 1,000 mg by mouth 2 (two) times daily with a meal. TAKE 1 TABLET(1000 MG) BY MOUTH TWICE DAILY WITH A MEAL) 180 tablet 4   pregabalin (LYRICA) 50 MG capsule Take 1 capsule (50 mg total) by mouth 3 (three) times daily. 90 capsule 2   Semaglutide,0.25 or 0.5MG/DOS, (OZEMPIC, 0.25 OR 0.5 MG/DOSE,) 2 MG/1.5ML SOPN Inject 0.5 mg into the skin once a week. 1.5 mL 2   Sodium Chloride-Sodium Bicarb (NETI POT SINUS WASH) 2300-700 MG KIT Use to wash out sinuses twice daily as needed 1 kit 0   No current facility-administered medications on file prior to visit.    Family History  Problem Relation Age of Onset  Pneumonia Mother    Diabetes Mother    Early death Father    Diabetes Sister    Diabetes Brother    Diabetes Maternal Grandmother     Social History   Tobacco Use   Smoking status: Every Day    Packs/day: 0.50    Years: 47.00    Pack years: 23.50    Types: Cigarettes   Smokeless tobacco: Former    Quit date: 11/28/2010   Tobacco comments:    1 PPD  Substance Use Topics   Alcohol use: No    Alcohol/week: 0.0 standard drinks   Drug use: No     Review of Systems: ROS negative except for what is noted on the assessment and plan.  Vitals:   10/17/21 0919  BP: (!) 163/73  Pulse: (!) 106  Temp: 98.4 F (36.9 C)  TempSrc: Oral  SpO2: 99%  Weight: 153 lb 1.6 oz (69.4 kg)  Height: 5' 7"  (1.702 m)     Physical Exam: General: african [place] female,  NAD HENT: normocephalic, atraumatic EYES: conjunctiva non-erythematous, no scleral icterus CV: regular rate, normal rhythm, no murmurs, rubs, gallops. Pulmonary: normal work of breathing on RA, lungs clear to auscultation, no rales, wheezes, rhonchi Abdominal: non-distended, soft, tenderness to palpation first diffusely then just in epigastrium then right epigastric area, no suprapubic tenderness, normal BS, no organomegaly, no masses or bulges palpated Skin: Warm and dry, no rashes or lesions Neurological: MS: awake, alert and oriented x3, normal speech and fund of knowledge Motor: moves all extremities antigravity MSK: TTP right lower ribs, swollen compared to left Psych: normal affect    Assessment & Plan:   See Encounters Tab for problem based charting.  Patient seen with Dr. Illene Regulus, M.D. [facility], PGY-1 Pager: [phone] Date 10/17/2021 Time 10:18 AM

## 2021-10-19 ENCOUNTER — Other Ambulatory Visit: Payer: Self-pay | Admitting: Internal Medicine

## 2021-10-19 DIAGNOSIS — Z1231 Encounter for screening mammogram for malignant neoplasm of breast: Secondary | ICD-10-CM

## 2021-10-23 ENCOUNTER — Other Ambulatory Visit (HOSPITAL_COMMUNITY): Payer: Self-pay

## 2021-10-26 ENCOUNTER — Telehealth: Payer: Self-pay

## 2021-10-26 NOTE — Telephone Encounter (Signed)
A Prior Authorization was initiated for this patients LIDOCAINE 5% PATCHES through CoverMyMeds.   Key: [id number]

## 2021-10-27 ENCOUNTER — Telehealth: Payer: Self-pay

## 2021-10-27 NOTE — Telephone Encounter (Signed)
DECISION :    DENIED  reason being you asked for the drug above for your RIGHT RIB PAIN> this is an off-label use that is not medically accepted ..     (COPY WITH THE FULL REASON PLACED TO BE SCANNED IN CHART  )    Also notified pharmacy tech who did the PA

## 2021-11-07 ENCOUNTER — Encounter: Payer: Medicare Other | Admitting: Internal Medicine

## 2021-11-08 ENCOUNTER — Other Ambulatory Visit: Payer: Self-pay

## 2021-11-08 DIAGNOSIS — E1142 Type 2 diabetes mellitus with diabetic polyneuropathy: Secondary | ICD-10-CM

## 2021-11-08 MED ORDER — METFORMIN HCL 1000 MG PO TABS: 1000.0000 mg | ORAL_TABLET | Freq: Two times a day (BID) | ORAL | 3 refills | Status: DC

## 2021-11-15 ENCOUNTER — Other Ambulatory Visit: Payer: Self-pay

## 2021-11-15 DIAGNOSIS — G629 Polyneuropathy, unspecified: Secondary | ICD-10-CM

## 2021-11-15 DIAGNOSIS — I10 Essential (primary) hypertension: Secondary | ICD-10-CM

## 2021-11-15 MED ORDER — PREGABALIN 50 MG PO CAPS: 50.0000 mg | ORAL_CAPSULE | Freq: Three times a day (TID) | ORAL | 2 refills | Status: DC

## 2021-11-15 MED ORDER — AMLODIPINE BESYLATE 10 MG PO TABS: 10.0000 mg | ORAL_TABLET | Freq: Every evening | ORAL | 2 refills | Status: DC

## 2021-11-15 NOTE — Telephone Encounter (Signed)
Called and spoke with patient. She continues to have pain in abdomen. Will send message to front desk to assist patient with scheduling follow-up appointment. Refilled Pregabalin and amlodipine.

## 2021-11-20 ENCOUNTER — Telehealth: Payer: Self-pay | Admitting: Internal Medicine

## 2021-11-20 ENCOUNTER — Other Ambulatory Visit: Payer: Self-pay | Admitting: Internal Medicine

## 2021-11-20 ENCOUNTER — Other Ambulatory Visit (HOSPITAL_COMMUNITY): Payer: Self-pay

## 2021-11-20 DIAGNOSIS — E1142 Type 2 diabetes mellitus with diabetic polyneuropathy: Secondary | ICD-10-CM

## 2021-11-20 MED ORDER — OZEMPIC (0.25 OR 0.5 MG/DOSE) 2 MG/1.5ML ~~LOC~~ SOPN
0.5000 mg | PEN_INJECTOR | SUBCUTANEOUS | 2 refills | Status: DC
  Filled 2021-11-20: qty 1.5, 28d supply, fill #0

## 2021-11-20 NOTE — Telephone Encounter (Signed)
#  180 with 3 refills sent to [facility] -  on 11/08/21. Attempted to notify patient to contact Pharmacy. No answer. Left message on VM requesting return call.

## 2021-11-20 NOTE — Telephone Encounter (Signed)
Refill Request   metFORMIN (GLUCOPHAGE) 1000 MG tablet  [id number] - [place], [place] - [address] AT [address] (Ph: [phone]

## 2021-11-20 NOTE — Telephone Encounter (Signed)
Refill Request- Pt clarified she has picked up her Metformin already from [facility].  Pt is now requesting not to use [facility].  Pt also needing the following medication.  Pt requesting to use the following pharmacy as well.  [facility] Address: [address], [place], [place] [address] Phone: [phone] Semaglutide,0.25 or 0.5MG /DOS, (OZEMPIC, 0.25 OR 0.5 MG/DOSE,) 2 MG/1.5ML [facility]

## 2021-11-21 ENCOUNTER — Other Ambulatory Visit (HOSPITAL_COMMUNITY): Payer: Self-pay

## 2021-11-22 ENCOUNTER — Ambulatory Visit: Payer: Medicare Other

## 2021-11-23 ENCOUNTER — Other Ambulatory Visit (HOSPITAL_COMMUNITY): Payer: Self-pay

## 2021-11-23 ENCOUNTER — Other Ambulatory Visit: Payer: Self-pay

## 2021-11-23 ENCOUNTER — Ambulatory Visit
Admission: RE | Admit: 2021-11-23 | Discharge: 2021-11-23 | Disposition: A | Discharge status: Home / Self Care | Payer: Medicare Other | Source: Ambulatory Visit | Attending: Internal Medicine | Admitting: Internal Medicine

## 2021-11-23 DIAGNOSIS — Z1231 Encounter for screening mammogram for malignant neoplasm of breast: Secondary | ICD-10-CM

## 2021-11-24 ENCOUNTER — Other Ambulatory Visit: Payer: Self-pay | Admitting: Internal Medicine

## 2021-11-24 DIAGNOSIS — R928 Other abnormal and inconclusive findings on diagnostic imaging of breast: Secondary | ICD-10-CM

## 2021-11-27 ENCOUNTER — Other Ambulatory Visit (HOSPITAL_COMMUNITY): Payer: Self-pay

## 2021-11-29 ENCOUNTER — Ambulatory Visit (INDEPENDENT_AMBULATORY_CARE_PROVIDER_SITE_OTHER): Payer: Self-pay | Admitting: Podiatry

## 2021-11-29 DIAGNOSIS — Z91199 Patient's noncompliance with other medical treatment and regimen due to unspecified reason: Secondary | ICD-10-CM

## 2021-12-04 NOTE — Progress Notes (Signed)
   Complete physical exam  Patient: Bridget Owens   DOB: 07/28/2001   66 y.o. Female  MRN: [id number]  Subjective:    No chief complaint on file.   Bridget Owens is a 66 y.o. female who presents today for a complete physical exam. She reports consuming a {diet types:17450} diet. {types:19826} She generally feels {DESC; WELL/FAIRLY WELL/POORLY:18703}. She reports sleeping {DESC; WELL/FAIRLY WELL/POORLY:18703}. She {does/does not:200015} have additional problems to discuss today.    Most recent fall risk assessment:    04/04/2022   10:42 AM  Fall Risk   Falls in the past year? 0  Number falls in past yr: 0  Injury with Fall? 0  Risk for fall due to : No Fall Risks  Follow up Falls evaluation completed     Most recent depression screenings:    04/04/2022   10:42 AM 02/23/2021   10:46 AM  PHQ 2/9 Scores  PHQ - 2 Score 0 0  PHQ- 9 Score 5     {VISON DENTAL STD PSA (Optional):27386}  {History (Optional):23778}  Patient Care Team: Jessup, Joy, NP as PCP - General (Nurse Practitioner)   Outpatient Medications Prior to Visit  Medication Sig   fluticasone (FLONASE) 50 MCG/ACT nasal spray Place 2 sprays into both nostrils in the morning and at bedtime. After 7 days, reduce to once daily.   norgestimate-ethinyl estradiol (SPRINTEC 28) 0.25-35 MG-MCG tablet Take 1 tablet by mouth daily.   Nystatin POWD Apply liberally to affected area 2 times per day   spironolactone (ALDACTONE) 100 MG tablet Take 1 tablet (100 mg total) by mouth daily.   No facility-administered medications prior to visit.    ROS        Objective:     There were no vitals taken for this visit. {Vitals History (Optional):23777}  Physical Exam   No results found for any visits on 05/10/22. {Show previous labs (optional):23779}    Assessment & Plan:    Routine Health Maintenance and Physical Exam  Immunization History  Administered Date(s) Administered   DTaP 10/11/1999, 12/07/1999,  02/15/2000, 10/31/2000, 05/16/2004   Hepatitis A 03/12/2008, 03/18/2009   Hepatitis B 07/29/1999, 09/05/1999, 02/15/2000   HiB (PRP-OMP) 10/11/1999, 12/07/1999, 02/15/2000, 10/31/2000   IPV 10/11/1999, 12/07/1999, 08/05/2000, 05/16/2004   Influenza,inj,Quad PF,6+ Mos 06/18/2014   Influenza-Unspecified 09/17/2012   MMR 08/05/2001, 05/16/2004   Meningococcal Polysaccharide 03/17/2012   Pneumococcal Conjugate-13 10/31/2000   Pneumococcal-Unspecified 02/15/2000, 04/30/2000   Tdap 03/17/2012   Varicella 08/05/2000, 03/12/2008    Health Maintenance  Topic Date Due   HIV Screening  Never done   Hepatitis C Screening  Never done   INFLUENZA VACCINE  05/08/2022   PAP-Cervical Cytology Screening  05/10/2022 (Originally 07/27/2020)   PAP SMEAR-Modifier  05/10/2022 (Originally 07/27/2020)   TETANUS/TDAP  05/10/2022 (Originally 03/17/2022)   HPV VACCINES  Discontinued   COVID-19 Vaccine  Discontinued    Discussed health benefits of physical activity, and encouraged her to engage in regular exercise appropriate for her age and condition.  Problem List Items Addressed This Visit   None Visit Diagnoses     Annual physical exam    -  Primary   Cervical cancer screening       Need for Tdap vaccination          No follow-ups on file.     Joy Jessup, NP   

## 2021-12-06 ENCOUNTER — Encounter: Payer: Medicare Other | Admitting: Internal Medicine

## 2021-12-08 ENCOUNTER — Ambulatory Visit (INDEPENDENT_AMBULATORY_CARE_PROVIDER_SITE_OTHER): Payer: Medicare Other | Admitting: Podiatry

## 2021-12-08 ENCOUNTER — Other Ambulatory Visit: Payer: Self-pay

## 2021-12-08 ENCOUNTER — Encounter: Payer: Self-pay | Admitting: Podiatry

## 2021-12-08 DIAGNOSIS — E1142 Type 2 diabetes mellitus with diabetic polyneuropathy: Secondary | ICD-10-CM

## 2021-12-08 DIAGNOSIS — M79675 Pain in left toe(s): Secondary | ICD-10-CM

## 2021-12-08 DIAGNOSIS — Q828 Other specified congenital malformations of skin: Secondary | ICD-10-CM

## 2021-12-08 DIAGNOSIS — M79674 Pain in right toe(s): Secondary | ICD-10-CM

## 2021-12-08 DIAGNOSIS — B351 Tinea unguium: Secondary | ICD-10-CM

## 2021-12-12 ENCOUNTER — Ambulatory Visit (INDEPENDENT_AMBULATORY_CARE_PROVIDER_SITE_OTHER): Payer: Medicare Other | Admitting: Internal Medicine

## 2021-12-12 VITALS — BP 137/66 | HR 86 | Temp 97.8°F | Wt 156.1 lb

## 2021-12-12 DIAGNOSIS — N9089 Other specified noninflammatory disorders of vulva and perineum: Secondary | ICD-10-CM | POA: Diagnosis not present

## 2021-12-12 DIAGNOSIS — N898 Other specified noninflammatory disorders of vagina: Secondary | ICD-10-CM

## 2021-12-12 DIAGNOSIS — E1142 Type 2 diabetes mellitus with diabetic polyneuropathy: Secondary | ICD-10-CM | POA: Diagnosis not present

## 2021-12-12 DIAGNOSIS — R1010 Upper abdominal pain, unspecified: Secondary | ICD-10-CM

## 2021-12-12 DIAGNOSIS — L819 Disorder of pigmentation, unspecified: Secondary | ICD-10-CM | POA: Diagnosis not present

## 2021-12-12 DIAGNOSIS — R10816 Epigastric abdominal tenderness: Secondary | ICD-10-CM | POA: Diagnosis not present

## 2021-12-12 LAB — POCT GLYCOSYLATED HEMOGLOBIN (HGB A1C): Hemoglobin A1C: 6.9 % — AB (ref 4.0–5.6)

## 2021-12-12 LAB — GLUCOSE, CAPILLARY: Glucose-Capillary: 176 mg/dL — ABNORMAL HIGH (ref 70–99)

## 2021-12-12 MED ORDER — DICLOFENAC SODIUM 1 % EX GEL: 2.0000 g | Freq: Every day | CUTANEOUS | 0 refills | Status: DC | PRN

## 2021-12-12 NOTE — Patient Instructions (Signed)
[name], it was a pleasure seeing you today! ? ?Today we discussed: ?Diabetes ?Your A1c ws better today, great job! Please continue taking metformin and semaglutide. Try to bring in your medications at your next visit. ? ?Vaginal swelling ?You have some skin changes to the outside. I am referring you to OBGYN for another opinion. Please try using vaginal moisturizers until you see them to see if that helps with the burning. ? ?Abdominal pain ?Please try using voltaren gel. ? ? ?I have ordered the following labs today: ? ?Lab Orders    ?     Microalbumin / Creatinine Urine Ratio    ?     Glucose, capillary    ?     POC Hbg A1C     ? ?I will call if any are abnormal. All of your labs can be accessed through "My Chart" ?  ?My Chart Access: ?[email or web address] ? ? ?Referrals ordered today:  ? ?Referral Orders    ?     Ambulatory referral to Gynecology    ?  ? ?I have ordered the following medication/changed the following medications:  ? ?Stop the following medications: ?Medications Discontinued During This Encounter  ?Medication Reason  ? chlorthalidone (HYGROTON) 25 MG tablet Change in therapy  ?  ? ?Start the following medications: ?Meds ordered this encounter  ?Medications  ? diclofenac Sodium (VOLTAREN) 1 % GEL  ?  Sig: Apply 2 g topically daily as needed.  ?  Dispense:  2 g  ?  Refill:  0  ?  ? ?Follow-up:  4 weeks   ? ?Please make sure to arrive 15 minutes prior to your next appointment. If you arrive late, you may be asked to reschedule.  ? ?We look forward to seeing you next time. Please call our clinic at [phone] if you have any questions or concerns. The best time to call is Monday-Friday from 9am-4pm, but there is someone available 24/7. If after hours or the weekend, call the main hospital number and ask for the Internal Medicine Resident On-Call. If you need medication refills, please notify your pharmacy one week in advance and they will send [place] a  request. ? ?Thank you for letting us take part in your care. Wishing you the best! ? ?Thank you, ?Dr. Howie Ill ?[place]  ?

## 2021-12-12 NOTE — Progress Notes (Signed)
? ? ?Subjective:  ?CC: irritation of labia, diabetes management ? ?HPI: ? ?Bridget Owens is a 66 y.o. female with a past medical history stated below and presents today for irritation of labia, diabetes management. Please see problem based assessment and plan for additional details. ? ?Past Medical History:  ?Diagnosis Date  ? Adenomatous colon polyp 6/09  ? Resected on colonoscopy, no high grade dysplasia.   ? Allergic rhinitis   ? At risk for polypharmacy 02/27/2018  ? Bell's palsy 01/2009  ? Left sided  ? Chest pain   ? Exercise stress test negative 6/07  ? Colon polyps   ? 03/22/2008: adenomatous polyps x3 w no dysplasia. Needs repeat colonoscopy in 3 years  ? Diabetic peripheral neuropathy ([place])   ? External hemorrhoid   ? GERD (gastroesophageal reflux disease)   ? Hyperlipidemia   ? Hypertension   ? Hypertensive retinopathy   ? Followed by Dr. Herbert Deaner.  ? Insomnia   ? Intolerance, drug   ? Leg cramps on Lipitor  ? Lipoma 12/11  ? Posterior neck.   ? Osteoarthritis   ? Carpometacarpal joint of right thumb  ? Postmenopausal bleeding 9/05  ? Endometrial biopsy showed  FOCAL TUBAL METAPLASIA   ? Postmenopausal symptoms   ? Hot flashes, vaginal dryness, iritability, difficulty sleeping. as of 2005.  ? Sebaceous cyst of breast   ? Has been refered to derm.  ? Trigger finger   ? Right thumb  ? Type II diabetes mellitus ([place])   ? Uterine fibroid   ? ? ?Current Outpatient Medications on File Prior to Visit  ?Medication Sig Dispense Refill  ? Accu-Chek FastClix Lancets MISC Check up to two times a day. 204 each 3  ? amLODipine (NORVASC) 10 MG tablet Take 1 tablet (10 mg total) by mouth every evening. 90 tablet 2  ? aspirin 81 MG EC tablet Take 81 mg by mouth daily.      ? atorvastatin (LIPITOR) 40 MG tablet TAKE 1 TABLET(40 MG) BY MOUTH DAILY 90 tablet 3  ? Blood Glucose Monitoring Suppl (ACCU-CHEK GUIDE) w/Device KIT USE AS DIRECTED 1 kit 0  ? cetirizine (ZYRTEC) 5 MG tablet Take 1 tablet (5 mg total) by mouth daily.  30 tablet 5  ? glucose blood (ACCU-CHEK GUIDE) test strip CHECK BLOOD SUGAR UP TO TWICE DAILY 200 strip 0  ? hydrochlorothiazide (HYDRODIURIL) 25 MG tablet Take 1 tablet (25 mg total) by mouth daily. 90 tablet 1  ? Insulin Pen Needle (PEN NEEDLES) 31G X 5 MM MISC Inject 1 applicator into the skin once a week. diag code E11.42. insulin dependent 30 each 1  ? meloxicam (MOBIC) 7.5 MG tablet Take 1 tablet (7.5 mg total) by mouth daily. 30 tablet 2  ? metFORMIN (GLUCOPHAGE) 1000 MG tablet Take 1 tablet (1,000 mg total) by mouth 2 (two) times daily with a meal. TAKE 1 TABLET(1000 MG) BY MOUTH TWICE DAILY WITH A MEAL 180 tablet 3  ? metFORMIN (GLUCOPHAGE) 500 MG tablet 2 tablets    ? pregabalin (LYRICA) 50 MG capsule Take 1 capsule (50 mg total) by mouth in the morning, at noon, and at bedtime. 90 capsule 2  ? Semaglutide,0.25 or 0.5MG/DOS, (OZEMPIC, 0.25 OR 0.5 MG/DOSE,) 2 MG/1.5ML SOPN Inject 0.5 mg into the skin once a week. 1.5 mL 2  ? Sodium Chloride-Sodium Bicarb (NETI POT SINUS WASH) 2300-700 MG KIT Use to wash out sinuses twice daily as needed 1 kit 0  ? ?No current facility-administered medications on  file prior to visit.  ? ? ?Family History  ?Problem Relation Age of Onset  ? Pneumonia Mother   ? Diabetes Mother   ? Early death Father   ? Diabetes Sister   ? Diabetes Brother   ? Diabetes Maternal Grandmother   ? ? ?Social History  ? ?Socioeconomic History  ? Marital status: Single  ?  Spouse name: Not on file  ? Number of children: Not on file  ? Years of education: Not on file  ? Highest education level: Not on file  ?Occupational History  ? Not on file  ?Tobacco Use  ? Smoking status: Every Day  ?  Packs/day: 0.50  ?  Years: 47.00  ?  Pack years: 23.50  ?  Types: Cigarettes  ? Smokeless tobacco: Former  ?  Quit date: 11/28/2010  ? Tobacco comments:  ?  1 PPD  ?Substance and Sexual Activity  ? Alcohol use: No  ?  Alcohol/week: 0.0 standard drinks  ? Drug use: No  ? Sexual activity: Not on file  ?Other Topics  Concern  ? Not on file  ?Social History Narrative  ? Not on file  ? ?Social Determinants of Health  ? ?Financial Resource Strain: Not on file  ?Food Insecurity: Not on file  ?Transportation Needs: Not on file  ?Physical Activity: Not on file  ?Stress: Not on file  ?Social Connections: Not on file  ?Intimate Partner Violence: Not on file  ? ? ?Review of Systems: ?ROS negative except for what is noted on the assessment and plan. ? ?Objective:  ? ?Vitals:  ? 12/12/21 0913  ?BP: 137/66  ?Pulse: 86  ?Temp: 97.8 ?F (36.6 ?C)  ?TempSrc: Oral  ?SpO2: 99%  ?Weight: 156 lb 1.6 oz (70.8 kg)  ? ? ?Physical Exam: ?Gen: A&O x3 and in no apparent distress, well appearing and nourished. ?Neck: no masses or nodules, AROM intact. ?CV: RRR, no murmurs, S1/S2 presents  ?Resp: Clear to ascultation bilaterally  ?Abd: BS (+) x4, soft, tenderness present in right epigastrium, no palpable lesions appreciated, no rash present ?MSK: Grossly normal AROM and strength x4 extremities. ?Skin: good skin turgor, no rashes, unusual bruising, or prominent lesions.  ?Neuro: No focal deficits, grossly normal sensation and coordination.  ?Psych: Oriented x3 and responding appropriately. Intact memory, normal mood, judgement, affect, and insight.  ?GU: Confluent hyperpigmentation present on posterior surfaces of labia majora bilaterally, no raised lesions present, skin is thickened, tender to palpation ? ? ? ?Assessment & Plan:  ?See Encounters Tab for problem based charting. ? ?Patient discussed with Dr. Saverio Danker ? ? ?Christiana Fuchs, D.O. ?[facility]  PGY-1 ?Pager: [phone]  Phone: [phone] ?Date 12/13/2021  Time 7:29 AM  ? ? ? ? ? ?

## 2021-12-13 ENCOUNTER — Encounter: Payer: Self-pay | Admitting: Internal Medicine

## 2021-12-13 DIAGNOSIS — N9089 Other specified noninflammatory disorders of vulva and perineum: Secondary | ICD-10-CM | POA: Insufficient documentation

## 2021-12-13 HISTORY — DX: Other specified noninflammatory disorders of vulva and perineum: N90.89

## 2021-12-13 LAB — MICROALBUMIN / CREATININE URINE RATIO
Creatinine, Urine: 85 mg/dL
Microalb/Creat Ratio: 8 mg/g creat (ref 0–29)
Microalbumin, Urine: 6.6 ug/mL

## 2021-12-13 NOTE — Assessment & Plan Note (Addendum)
Patient presents for follow-up on management of diabetes.  She restarted Ozempic in December.  She states that she has not had nausea with restarting medication.  She is concerned about being back on Ozempic and it causing irritation to outside of vagina. She injects it in front of thighs.  She endorses taking metformin 1000 mg twice daily.  Her hemoglobin A1c improved from 9.3-6.9.  ?A/P: ?Encouraged patient to continue taking Ozempic, that is was unlikely to be cause of irritation. ?-Continue Ozempic 0.5 mg, metformin 1000 mg BID ?-F/u in 4 weeks ?-microalbum/ creatinine ratio ?-HgbA1c ?-request records from opthalmology ?

## 2021-12-13 NOTE — Progress Notes (Signed)
Internal Medicine Clinic Attending  Case discussed with Dr. Masters  At the time of the visit.  We reviewed the resident's history and exam and pertinent patient test results.  I agree with the assessment, diagnosis, and plan of care documented in the resident's note.  

## 2021-12-13 NOTE — Addendum Note (Signed)
Addended by: Charise Killian on: 12/13/2021 02:53 PM ? ? Modules accepted: Level of Service ? ?

## 2021-12-13 NOTE — Assessment & Plan Note (Addendum)
Patient continues to have abdominal pain that radiates to her back.  She states that she began to notice this after falling at [facility] over a year ago.  Describes pain as burning feeling in the right epigastrium.  She endorses feeling a knot.  The pain is burning and radiates to her back.  She has noticed that pain improves after taking Lyrica.  Patient states that she has not noticed a rash in that area at any time. This was evaluated in January and x-ray completed without signs of fracture. Weight is stable from prior visit.  On exam, no erythema or rash present.  Tenderness to palpation present in right epigastrium, patient states that pain improves with palpation.  ?A/P: ?Distribution of pain seems dermatomal.  Differentials include herpes zoster vs. anterior cutaneous nerve entrapment syndrome.  Unclear exactly when symptoms started as patient describes since fall over a year ago appointment today, but stated in January it started a week ago.  Improvement of pain with Lyrica supports pain being related to nerves. ?-Voltaren gel PRN ?

## 2021-12-13 NOTE — Assessment & Plan Note (Signed)
Patient presents reporting swelling and irritation outside of vagina.  She states that this started about 2 days ago.  She denies dysuria, but states the outside burns.  She is concerned that Ozempic or Lyrica could be causing swelling.  She injects Ozempic in her outer leg.  She states that this has happened in the past and went away on its own. She puts water on the area to try to relieve burning. She is not currently sexually active.  Patient examined with chaperone present. Hyperpigmentation present to posterior surfaces of labia majora bilaterally, area is confluent in pigmentation and no raised lesions are present. Patient endorses worsening of burning when tissue is palpated. No swelling noted. ?A/P: ?Differentials include physiologic hyperpigmentation, post-inflammatory hyperpigmentation, lichen planus pigmentosus, and squamous intraepithelial lesions. Will need biopsy to further differentiate. ?-referral to GYN ?

## 2021-12-14 ENCOUNTER — Telehealth: Payer: Self-pay

## 2021-12-14 NOTE — Telephone Encounter (Signed)
Pt is requesting a call back .. she stated that at her last OV her PCP was to give her a cream  for her private area because she is dry .. but nothing was called to the pharmacy  ?

## 2021-12-17 NOTE — Progress Notes (Signed)
?  Subjective:  ?Patient ID: Bridget Owens, female    DOB: 05/29/1956,  MRN: [id number] ? ?[name] presents to clinic today for at risk foot care with history of diabetic neuropathy and painful porokeratotic lesion(s) of both feet and painful mycotic toenails that limit ambulation. Painful toenails interfere with ambulation. Aggravating factors include wearing enclosed shoe gear. Pain is relieved with periodic professional debridement. Painful porokeratotic lesions are aggravated when weightbearing with and without shoegear. Pain is relieved with periodic professional debridement. ? ?Patient did not check blood glucose today. ? ?New problem(s): None.  ? ?PCP is Masters, [place], DO , and last visit was August 28, 2021. ? ?Allergies  ?Allergen Reactions  ? Codeine Other (See Comments)  ?  "felt funny"  ? ? ?Review of Systems: Negative except as noted in the HPI. ?Objective:  ? ?Constitutional [name] is a pleasant 66 y.o. African American female, in NAD. AAO x 3.   ?Vascular CFT <3 seconds b/l LE. Palpable pedal pulses b/l LE. Pedal hair absent. No pain with calf compression b/l. Lower extremity skin temperature gradient within normal limits. No edema noted b/l LE. No cyanosis or clubbing noted b/l LE.  ?Neurologic Normal speech. Oriented to person, place, and time. Pt has subjective symptoms of neuropathy. Protective sensation intact 5/5 intact bilaterally with 10g monofilament b/l. Vibratory sensation intact b/l.  ?Dermatologic Pedal skin is warm and supple b/l LE. No open wounds b/l LE. No interdigital macerations noted b/l LE. Toenails 1-5 b/l elongated, discolored, dystrophic, thickened, crumbly with subungual debris and tenderness to dorsal palpation. Porokeratotic lesion(s) submet head 1 right foot and submet head 5 b/l. No erythema, no edema, no drainage, no fluctuance.  ?Orthopedic: Muscle strength 5/5 to all lower extremity muscle groups bilaterally. Tailor's bunion deformity noted b/l LE.   ? ?Radiographs: None ? ?Last A1c:  ?Hemoglobin A1C Latest Ref Rng & Units 12/12/2021 07/12/2021 03/13/2021  ?HGBA1C 4.0 - 5.6 % 6.9(A) 9.3(A) 6.3(A)  ?Some recent data might be hidden  ? ?  ?Assessment:  ? ?1. Pain due to onychomycosis of toenails of both feet   ?2. Porokeratosis   ?3. Diabetic peripheral neuropathy associated with type 2 diabetes mellitus ([place])   ? ?Plan:  ?Patient was evaluated and treated and all questions answered. ?Consent given for treatment as described below: ?-Toenails 1-5 b/l were debrided in length and girth with sterile nail nippers and dremel without iatrogenic bleeding.  ?-Painful porokeratotic lesion(s) submet head 1 right foot and submet head 5 b/l pared and enucleated with sterile scalpel blade without incident. Total number of lesions debrided=3. ?-Patient/POA to call should there be question/concern in the interim. ? ?Return in about 3 months (around 03/10/2022). ? ?Marzetta Board, DPM ?

## 2021-12-22 ENCOUNTER — Other Ambulatory Visit: Payer: Self-pay

## 2021-12-22 DIAGNOSIS — I10 Essential (primary) hypertension: Secondary | ICD-10-CM

## 2021-12-22 MED ORDER — HYDROCHLOROTHIAZIDE 25 MG PO TABS: 25.0000 mg | ORAL_TABLET | Freq: Every day | ORAL | 2 refills | Status: DC

## 2021-12-28 ENCOUNTER — Ambulatory Visit (INDEPENDENT_AMBULATORY_CARE_PROVIDER_SITE_OTHER): Payer: Medicare Other | Admitting: Student

## 2021-12-28 DIAGNOSIS — R10816 Epigastric abdominal tenderness: Secondary | ICD-10-CM

## 2021-12-28 NOTE — Patient Instructions (Signed)
Ms.Shandrea L Rottinghaus, it was a pleasure seeing you today! ? ?Today we discussed: ?- I hope your pain improves with this shot. You should feel relief within the next hour. I'll give you a call tomorrow to see how you are doing. ? ?Follow-up:  as needed   ? ?Please make sure to arrive 15 minutes prior to your next appointment. If you arrive late, you may be asked to reschedule.  ? ?We look forward to seeing you next time. Please call our clinic at [phone] if you have any questions or concerns. The best time to call is Monday-Friday from 9am-4pm, but there is someone available 24/7. If after hours or the weekend, call the main hospital number and ask for the Internal Medicine Resident On-Call. If you need medication refills, please notify your pharmacy one week in advance and they will send [place] a request. ? ?Thank you for letting us take part in your care. Wishing you the best! ? ?Thank you, ?Sanjuan Dame, MD ? ?

## 2021-12-29 NOTE — Assessment & Plan Note (Signed)
Patient is returning to clinic today to discuss continual abdominal pain. Reports this pain has been present for at least a year. Similar to previous presentations, she reports burning, sharp pain in her RUQ. Denies any radiation of pain. She denies any association with body movements, food, or drink.  Also denies any change in bowel movements, nausea, vomiting. Mentions her appetite is normal and has not had any changes in diet. Reports 1 BM daily, no melena or bright red blood. She has not noticed any rashes on her abdomen or elsewhere. Bridget Owens does report Lyrica has helped her symptoms intermittently, but voltaren gel has not. ? ?During previous visits, work-up negative for rib fracture. Physical exam revealed location of pain primarily in superficial tissues. 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 Bedside ultrasound did not reveal any obvious areas of nerve entrapment. Mutual decision was made with patient to trial diagnostic and therapeutic trigger point injection. A 1cc injection of 1% lidocaine was performed at the spot with immediate relief of pain per patient. If patient continues to have pain after this, consider work-up for gallbladder disease with CMP and RUQ ultrasound. Patient to return if symptoms return. ? ?- Trigger point injection today ?- Return precautions given ?- Consider gallbladder disease if symptoms return ?

## 2021-12-29 NOTE — Progress Notes (Signed)
? ?CC: abdominal pain ? ?HPI: ? ?[name] is a 66 y.o. person with medical history as below presenting to [facility] for abdominal pain. ? ?Please see problem-based list for further details, assessments, and plans. ? ?Past Medical History:  ?Diagnosis Date  ? Adenomatous colon polyp 6/09  ? Resected on colonoscopy, no high grade dysplasia.   ? Allergic rhinitis   ? At risk for polypharmacy 02/27/2018  ? Bell's palsy 01/2009  ? Left sided  ? Chest pain   ? Exercise stress test negative 6/07  ? Colon polyps   ? 03/22/2008: adenomatous polyps x3 w no dysplasia. Needs repeat colonoscopy in 3 years  ? Diabetic peripheral neuropathy ([place])   ? External hemorrhoid   ? GERD (gastroesophageal reflux disease)   ? Hyperlipidemia   ? Hypertension   ? Hypertensive retinopathy   ? Followed by Dr. Herbert Deaner.  ? Insomnia   ? Intolerance, drug   ? Leg cramps on Lipitor  ? Lipoma 12/11  ? Posterior neck.   ? Osteoarthritis   ? Carpometacarpal joint of right thumb  ? Postmenopausal bleeding 9/05  ? Endometrial biopsy showed  FOCAL TUBAL METAPLASIA   ? Postmenopausal symptoms   ? Hot flashes, vaginal dryness, iritability, difficulty sleeping. as of 2005.  ? Sebaceous cyst of breast   ? Has been refered to derm.  ? Trigger finger   ? Right thumb  ? Type II diabetes mellitus ([place])   ? Uterine fibroid   ? ?Review of Systems:  As per HPI ? ?Physical Exam: ? ?Vitals:  ? 12/28/21 1554  ?BP: 140/67  ?Pulse: 94  ?Temp: 98.5 ?F (36.9 ?C)  ?TempSrc: Oral  ?SpO2: 100%  ?Weight: 161 lb 12.8 oz (73.4 kg)  ?Height: '5\' 7"'$  (1.702 m)  ? ?General: Resting comfortably in no acute distress ?Abd: Soft, non-distended. Focal tenderness to light palpation over small area of LUQ. No mass appreciated or overlying skin changes.  ?Skin: Warm, dry. No rashes or lesions appreciated. ?Neuro: Awake, alert, conversing appropriately. ?Psych: Normal mood, affect, speech. ? ?Assessment & Plan:  ? ?Abdominal tenderness, epigastric ?Patient is returning to clinic today to  discuss continual abdominal pain. Reports this pain has been present for at least a year. Similar to previous presentations, she reports burning, sharp pain in her RUQ. Denies any radiation of pain. She denies any association with body movements, food, or drink.  Also denies any change in bowel movements, nausea, vomiting. Mentions her appetite is normal and has not had any changes in diet. Reports 1 BM daily, no melena or bright red blood. She has not noticed any rashes on her abdomen or elsewhere. Bridget Owens does report Lyrica has helped her symptoms intermittently, but voltaren gel has not. ? ?During previous visits, work-up negative for rib fracture. Physical exam revealed location of pain primarily in superficial tissues. 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 Bedside ultrasound did not reveal any obvious areas of nerve entrapment. Mutual decision was made with patient to trial diagnostic and therapeutic trigger point injection. A 1cc injection of 1% lidocaine was performed at the spot with immediate relief of pain per patient. If patient continues to have pain after this, consider work-up for gallbladder disease with CMP and RUQ ultrasound. Patient to return if symptoms return. ? ?- Trigger point injection today ?- Return precautions given ?- Consider gallbladder disease if symptoms return ? ?A trigger point injection was performed at the site of maximal tenderness  using 1% plain Lidocaine. This was well tolerated, and followed by relief of pain.  ? ?Patient discussed with Dr. Evette Doffing ? ?Sanjuan Dame, MD ?Internal Medicine PGY-2 ?Pager: [phone] ? ?

## 2022-01-01 NOTE — Progress Notes (Signed)
Internal Medicine Clinic Attending  I saw and evaluated the patient.  I personally confirmed the key portions of the history and exam documented by Dr. Braswell and I reviewed pertinent patient test results.  The assessment, diagnosis, and plan were formulated together and I agree with the documentation in the resident's note. I was present for the entirety of the procedure. 

## 2022-01-09 ENCOUNTER — Other Ambulatory Visit: Payer: Self-pay

## 2022-01-09 DIAGNOSIS — E1142 Type 2 diabetes mellitus with diabetic polyneuropathy: Secondary | ICD-10-CM

## 2022-01-09 MED ORDER — OZEMPIC (0.25 OR 0.5 MG/DOSE) 2 MG/1.5ML ~~LOC~~ SOPN: 0.5000 mg | PEN_INJECTOR | SUBCUTANEOUS | 0 refills | Status: DC

## 2022-01-16 ENCOUNTER — Other Ambulatory Visit: Payer: Self-pay

## 2022-01-16 DIAGNOSIS — E118 Type 2 diabetes mellitus with unspecified complications: Secondary | ICD-10-CM

## 2022-01-17 MED ORDER — ACCU-CHEK GUIDE VI STRP: ORAL_STRIP | 3 refills | Status: DC

## 2022-01-18 ENCOUNTER — Telehealth: Payer: Self-pay | Admitting: *Deleted

## 2022-01-18 NOTE — Telephone Encounter (Signed)
Pt called and stated she's having trouble swallowing the "sinus pill" (Zyrtec). Requesting pills be change to liquid (elixir). ?Thanks ?

## 2022-01-30 ENCOUNTER — Ambulatory Visit
Admission: RE | Admit: 2022-01-30 | Discharge: 2022-01-30 | Disposition: A | Discharge status: Home / Self Care | Payer: Medicare Other | Source: Ambulatory Visit | Attending: Internal Medicine | Admitting: Internal Medicine

## 2022-01-30 DIAGNOSIS — R928 Other abnormal and inconclusive findings on diagnostic imaging of breast: Secondary | ICD-10-CM

## 2022-01-31 ENCOUNTER — Ambulatory Visit: Payer: Medicare Other | Admitting: Obstetrics and Gynecology

## 2022-02-19 LAB — HM DIABETES EYE EXAM

## 2022-02-22 ENCOUNTER — Encounter: Payer: Medicare Other | Admitting: Internal Medicine

## 2022-02-23 ENCOUNTER — Encounter: Payer: Self-pay | Admitting: Internal Medicine

## 2022-02-28 ENCOUNTER — Encounter: Payer: Self-pay | Admitting: Internal Medicine

## 2022-02-28 ENCOUNTER — Other Ambulatory Visit: Payer: Self-pay

## 2022-02-28 ENCOUNTER — Ambulatory Visit (INDEPENDENT_AMBULATORY_CARE_PROVIDER_SITE_OTHER): Payer: Medicare Other | Admitting: Internal Medicine

## 2022-02-28 VITALS — BP 139/76 | HR 90 | Temp 97.8°F | Ht 67.0 in | Wt 160.8 lb

## 2022-02-28 DIAGNOSIS — R5381 Other malaise: Secondary | ICD-10-CM

## 2022-02-28 DIAGNOSIS — F17210 Nicotine dependence, cigarettes, uncomplicated: Secondary | ICD-10-CM

## 2022-02-28 DIAGNOSIS — L304 Erythema intertrigo: Secondary | ICD-10-CM | POA: Diagnosis present

## 2022-02-28 DIAGNOSIS — H9203 Otalgia, bilateral: Secondary | ICD-10-CM

## 2022-02-28 MED ORDER — CETIRIZINE HCL 5 MG/5ML PO SOLN: 5.0000 mg | Freq: Every day | ORAL | 3 refills | Status: DC

## 2022-02-28 MED ORDER — CLOTRIMAZOLE 1 % EX CREA: 1.0000 "application " | TOPICAL_CREAM | Freq: Two times a day (BID) | CUTANEOUS | 0 refills | Status: DC

## 2022-02-28 NOTE — Progress Notes (Signed)
   CC: rash  HPI:  [name] is a 66 y.o. PMH noted below, who presents to the [facility] with complaints of a rash. To see the management of his acute and chronic conditions, please refer to the A&P note under the encounters tab.   Past Medical History:  Diagnosis Date   Adenomatous colon polyp 6/09   Resected on colonoscopy, no high grade dysplasia.    Allergic rhinitis    At risk for polypharmacy 02/27/2018   Bell's palsy 01/2009   Left sided   Chest pain    Exercise stress test negative 6/07   Colon polyps    03/22/2008: adenomatous polyps x3 w no dysplasia. Needs repeat colonoscopy in 3 years   Diabetic peripheral neuropathy ([facility])    External hemorrhoid    GERD (gastroesophageal reflux disease)    Hyperlipidemia    Hypertension    Hypertensive retinopathy    Followed by Dr. Herbert Deaner.   Insomnia    Intolerance, drug    Leg cramps on Lipitor   Lipoma 12/11   Posterior neck.    Osteoarthritis    Carpometacarpal joint of right thumb   Postmenopausal bleeding 9/05   Endometrial biopsy showed  FOCAL TUBAL METAPLASIA    Postmenopausal symptoms    Hot flashes, vaginal dryness, iritability, difficulty sleeping. as of 2005.   Sebaceous cyst of breast    Has been refered to derm.   Trigger finger    Right thumb   Type II diabetes mellitus ([facility])    Uterine fibroid    Review of Systems:  positive for rash, breast tenderness, ear pain, runny nose, negative for fever, chills, falls, fatigue  Physical Exam: Gen: elderly woman in NAD HEENT: normocephalic atraumatic, tympanic membranes visualized bilaterally with no effusions, auditory canals clear free of  wax impaction, mild irritation of the canal, no pain with manipulation of the pinnae CV: RRR, no m/r/g   Resp: CTAB, normal WOB  GI: soft, nontender MSK: moves all extremities without difficulty Skin: scaling rash beneath the breast folds bilaterally. No maceration, erythema, or skin breakdown noted, no axillary  lymphadenopathy, no breast lesions identified Neuro:alert answering questions appropriately Psych: normal affect   Assessment & Plan:   See Encounters Tab for problem based charting.  Patient discussed with Dr. Saverio Danker

## 2022-02-28 NOTE — Assessment & Plan Note (Signed)
Scaling rash with mild irritation present beneath the bilateral breasts. Very mild, no redness or skin breakdown noted. Counseled patient on keeping the area clean and dry - clotrimazole cream BID

## 2022-02-28 NOTE — Assessment & Plan Note (Signed)
Patient still smoking, says that it helps her calm down. Discussed that it would be beneficial given her lung problems to quit smoking and we could assist her in lowering her stress level in other ways such as counseling and or medication to help her quit smoking. Patient says she will work on quitting on her own however I think she is in the precontemplative phase.

## 2022-02-28 NOTE — Assessment & Plan Note (Signed)
Ear exam normal today with no wax impaction, possible mild irritation of the ear canals. TM visualized with no effusions noted. Suspect patient may have some allergic symptoms and recommended that she take zyrtec and see if this helps with her pain. She has been prescribed it in the past but has never actually taken this. Prefers the liquid to the pill. - zyrtec prescription - counseled on discontinuing peroxide ear rinses which may be causing some of the irritation

## 2022-02-28 NOTE — Patient Instructions (Signed)
Bridget Owens  It was a pleasure seeing you in the clinic today.   We talked about your ear pain and your rash.  Rash- I am prescribing you a cream to use under your breasts twice a day. Try to keep that area clean and dry as that will help reduce the irritation.  Ears- Try taking zyrtec daily. Stop using the peroxide in your ears.   Please call our clinic at [phone] if you have any questions or concerns. The best time to call is Monday-Friday from 9am-4pm, but there is someone available 24/7 at the same number. If you need medication refills, please notify your pharmacy one week in advance and they will send [place] a request.   Thank you for letting us take part in your care. We look forward to seeing you next time!

## 2022-03-01 ENCOUNTER — Telehealth: Payer: Self-pay | Admitting: *Deleted

## 2022-03-01 NOTE — Chronic Care Management (AMB) (Signed)
  Care Management   Note  03/01/2022 Name: DEALIE KOELZER MRN: [id number] DOB: 1955-12-21  [name] is a 66 y.o. year old female who is a primary care patient of Masters, Joellen Jersey, DO. I reached out to [name] by phone today offer care coordination services.   Ms. Gatling was given information about care management services today including:  Care management services include personalized support from designated clinical staff supervised by her physician, including individualized plan of care and coordination with other care providers 24/7 contact phone numbers for assistance for urgent and routine care needs. The patient may stop care management services at any time by phone call to the office staff.  Patient agreed to services and verbal consent obtained.   Follow up plan: Telephone appointment with care management team member scheduled for:03/19/22  If patient returns call to provider office, please advise to call [place] at [phone].  [place] Management  Direct Dial: [phone]

## 2022-03-08 NOTE — Progress Notes (Signed)
Internal Medicine Clinic Attending ° °Case discussed with Dr. DeMaio  At the time of the visit.  We reviewed the resident’s history and exam and pertinent patient test results.  I agree with the assessment, diagnosis, and plan of care documented in the resident’s note. ° ° °

## 2022-03-12 ENCOUNTER — Ambulatory Visit (INDEPENDENT_AMBULATORY_CARE_PROVIDER_SITE_OTHER): Payer: Medicare Other | Admitting: Podiatry

## 2022-03-12 DIAGNOSIS — Z91199 Patient's noncompliance with other medical treatment and regimen due to unspecified reason: Secondary | ICD-10-CM

## 2022-03-12 NOTE — Progress Notes (Signed)
   Complete physical exam  Patient: Bridget Owens   DOB: 07/28/2001   66 y.o. Female  MRN: [id number]  Subjective:    No chief complaint on file.   Bridget Owens is a 66 y.o. female who presents today for a complete physical exam. She reports consuming a {diet types:17450} diet. {types:19826} She generally feels {DESC; WELL/FAIRLY WELL/POORLY:18703}. She reports sleeping {DESC; WELL/FAIRLY WELL/POORLY:18703}. She {does/does not:200015} have additional problems to discuss today.    Most recent fall risk assessment:    04/04/2022   10:42 AM  Fall Risk   Falls in the past year? 0  Number falls in past yr: 0  Injury with Fall? 0  Risk for fall due to : No Fall Risks  Follow up Falls evaluation completed     Most recent depression screenings:    04/04/2022   10:42 AM 02/23/2021   10:46 AM  PHQ 2/9 Scores  PHQ - 2 Score 0 0  PHQ- 9 Score 5     {VISON DENTAL STD PSA (Optional):27386}  {History (Optional):23778}  Patient Care Team: Jessup, Joy, NP as PCP - General (Nurse Practitioner)   Outpatient Medications Prior to Visit  Medication Sig   fluticasone (FLONASE) 50 MCG/ACT nasal spray Place 2 sprays into both nostrils in the morning and at bedtime. After 7 days, reduce to once daily.   norgestimate-ethinyl estradiol (SPRINTEC 28) 0.25-35 MG-MCG tablet Take 1 tablet by mouth daily.   Nystatin POWD Apply liberally to affected area 2 times per day   spironolactone (ALDACTONE) 100 MG tablet Take 1 tablet (100 mg total) by mouth daily.   No facility-administered medications prior to visit.    ROS        Objective:     There were no vitals taken for this visit. {Vitals History (Optional):23777}  Physical Exam   No results found for any visits on 05/10/22. {Show previous labs (optional):23779}    Assessment & Plan:    Routine Health Maintenance and Physical Exam  Immunization History  Administered Date(s) Administered   DTaP 10/11/1999, 12/07/1999,  02/15/2000, 10/31/2000, 05/16/2004   Hepatitis A 03/12/2008, 03/18/2009   Hepatitis B 07/29/1999, 09/05/1999, 02/15/2000   HiB (PRP-OMP) 10/11/1999, 12/07/1999, 02/15/2000, 10/31/2000   IPV 10/11/1999, 12/07/1999, 08/05/2000, 05/16/2004   Influenza,inj,Quad PF,6+ Mos 06/18/2014   Influenza-Unspecified 09/17/2012   MMR 08/05/2001, 05/16/2004   Meningococcal Polysaccharide 03/17/2012   Pneumococcal Conjugate-13 10/31/2000   Pneumococcal-Unspecified 02/15/2000, 04/30/2000   Tdap 03/17/2012   Varicella 08/05/2000, 03/12/2008    Health Maintenance  Topic Date Due   HIV Screening  Never done   Hepatitis C Screening  Never done   INFLUENZA VACCINE  05/08/2022   PAP-Cervical Cytology Screening  05/10/2022 (Originally 07/27/2020)   PAP SMEAR-Modifier  05/10/2022 (Originally 07/27/2020)   TETANUS/TDAP  05/10/2022 (Originally 03/17/2022)   HPV VACCINES  Discontinued   COVID-19 Vaccine  Discontinued    Discussed health benefits of physical activity, and encouraged her to engage in regular exercise appropriate for her age and condition.  Problem List Items Addressed This Visit   None Visit Diagnoses     Annual physical exam    -  Primary   Cervical cancer screening       Need for Tdap vaccination          No follow-ups on file.     Joy Jessup, NP   

## 2022-03-14 ENCOUNTER — Ambulatory Visit (INDEPENDENT_AMBULATORY_CARE_PROVIDER_SITE_OTHER): Payer: Medicare Other | Admitting: Podiatry

## 2022-03-14 ENCOUNTER — Encounter: Payer: Self-pay | Admitting: Podiatry

## 2022-03-14 DIAGNOSIS — B351 Tinea unguium: Secondary | ICD-10-CM | POA: Diagnosis not present

## 2022-03-14 DIAGNOSIS — E1142 Type 2 diabetes mellitus with diabetic polyneuropathy: Secondary | ICD-10-CM | POA: Diagnosis not present

## 2022-03-14 DIAGNOSIS — M79674 Pain in right toe(s): Secondary | ICD-10-CM

## 2022-03-14 DIAGNOSIS — Q828 Other specified congenital malformations of skin: Secondary | ICD-10-CM

## 2022-03-14 DIAGNOSIS — M79675 Pain in left toe(s): Secondary | ICD-10-CM | POA: Diagnosis not present

## 2022-03-18 NOTE — Progress Notes (Signed)
  Subjective:  Patient ID: Bridget Owens, female    DOB: 1956/01/13,  MRN: [id number]  Bridget Owens presents to clinic today for at risk foot care with history of diabetic neuropathy and callus(es) b/l lower extremities and painful thick toenails that are difficult to trim. Painful toenails interfere with ambulation. Aggravating factors include wearing enclosed shoe gear. Pain is relieved with periodic professional debridement. Painful calluses are aggravated when weightbearing with and without shoegear. Pain is relieved with periodic professional debridement.   Last known HgA1c was unknown.  New problem(s): None.   PCP is Masters, Joellen Jersey, DO , and last visit was December 12, 2021.  Allergies  Allergen Reactions   Codeine Other (See Comments)    "felt funny"    Review of Systems: Negative except as noted in the HPI.  Objective: No changes noted in today's physical examination. Constitutional Bridget Owens is a pleasant 66 y.o. African American female, in NAD. AAO x 3.   Vascular CFT <3 seconds b/l LE. Palpable pedal pulses b/l LE. Pedal hair absent. No pain with calf compression b/l. Lower extremity skin temperature gradient within normal limits. No edema noted b/l LE. No cyanosis or clubbing noted b/l LE.  Neurologic Normal speech. Oriented to person, place, and time. Pt has subjective symptoms of neuropathy. Protective sensation intact 5/5 intact bilaterally with 10g monofilament b/l. Vibratory sensation intact b/l.  Dermatologic Pedal skin is warm and supple b/l LE. No open wounds b/l LE. No interdigital macerations noted b/l LE. Toenails 1-5 b/l elongated, discolored, dystrophic, thickened, crumbly with subungual debris and tenderness to dorsal palpation. Porokeratotic lesion(s) submet head 1 right foot and submet head 5 b/l. No erythema, no edema, no drainage, no fluctuance.  Orthopedic: Muscle strength 5/5 to all lower extremity muscle groups bilaterally. Tailor's bunion deformity noted b/l  LE.   Radiographs: None     Latest Ref Rng & Units 12/12/2021    9:42 AM 07/12/2021    9:27 AM  Hemoglobin A1C  Hemoglobin-A1c 4.0 - 5.6 % 6.9  9.3     Assessment/Plan: 1. Pain due to onychomycosis of toenails of both feet   2. Porokeratosis   3. Diabetic peripheral neuropathy associated with type 2 diabetes mellitus ([place])      -Examined patient. -Mycotic toenails 1-5 bilaterally were debrided in length and girth with sterile nail nippers and dremel without incident. -Porokeratotic lesion(s) submet head 1 right foot and submet head 5 b/l pared and enucleated with sterile scalpel blade without incident. Total number of lesions debrided=3. -Patient/POA to call should there be question/concern in the interim.   Return in about 3 months (around 06/14/2022).  Marzetta Board, DPM

## 2022-03-19 ENCOUNTER — Ambulatory Visit: Payer: Medicare Other | Admitting: Licensed Clinical Social Worker

## 2022-03-19 NOTE — Chronic Care Management (AMB) (Signed)
  Care Management   Social Work Visit Note  03/19/2022 Name: Bridget Owens MRN: [id number] DOB: 1956/09/14  Bridget Owens is a 66 y.o. year old female who sees Masters, Joellen Jersey, DO for primary care. The care management team was consulted for assistance with care management and care coordination needs related to  Initial outreach     Patient was given the following information about care management and care coordination services today, agreed to services, and gave verbal consent: 1.care management/care coordination services include personalized support from designated clinical staff supervised by their physician, including individualized plan of care and coordination with other care providers 2. 24/7 contact phone numbers for assistance for urgent and routine care needs. 3. The patient may stop care management/care coordination services at any time by phone call to the office staff.  Engaged with patient by telephone for initial visit in response to provider referral for social work chronic care management and care coordination services.  Assessment: Review of patient history, allergies, and health status during evaluation of patient need for care management/care coordination services.    Interventions:  Patient interviewed and appropriate assessments performed Collaborated with clinical team regarding patient needs  Successful outreach to patient on today. Patient discussed wanting to move from her current apartment to another apartment. Patient is with [employer] and understands approval would need to come from agency. Patient stated they denied her move at this time.  Patient inquired about assistance with lawsuit against [employer]. SW discussed attorneys and legal aid. Patient stated neither will take on case.  Patient stated no additional concerns or needs.  No additional follow up is needed a this time. Patient has SW contact information if needed.  SDOH (Social Determinants of Health)  assessments performed: Yes     Plan:  NO additional follow up.  Lenor Derrick, MSW  Social Worker [facility] Care Management  [phone]

## 2022-03-19 NOTE — Patient Instructions (Signed)
Visit Information  Instructions:   Patient was given the following information about care management and care coordination services today, agreed to services, and gave verbal consent: 1.care management/care coordination services include personalized support from designated clinical staff supervised by their physician, including individualized plan of care and coordination with other care providers 2. 24/7 contact phone numbers for assistance for urgent and routine care needs. 3. The patient may stop care management/care coordination services at any time by phone call to the office staff.  Patient verbalizes understanding of instructions and care plan provided today and agrees to view in MyChart. Active MyChart status and patient understanding of how to access instructions and care plan via MyChart confirmed with patient.     No further follow up required: .  Jiah Bari, BSW , MSW Social Worker [facility] Care Management  [phone]      

## 2022-03-20 ENCOUNTER — Encounter: Payer: Medicare Other | Admitting: Internal Medicine

## 2022-04-03 ENCOUNTER — Other Ambulatory Visit: Payer: Self-pay

## 2022-04-03 DIAGNOSIS — E1142 Type 2 diabetes mellitus with diabetic polyneuropathy: Secondary | ICD-10-CM

## 2022-04-03 MED ORDER — OZEMPIC (0.25 OR 0.5 MG/DOSE) 2 MG/1.5ML ~~LOC~~ SOPN: 0.5000 mg | PEN_INJECTOR | SUBCUTANEOUS | 0 refills | Status: AC

## 2022-04-12 ENCOUNTER — Other Ambulatory Visit: Payer: Self-pay

## 2022-04-12 ENCOUNTER — Ambulatory Visit (INDEPENDENT_AMBULATORY_CARE_PROVIDER_SITE_OTHER): Payer: Medicare Other | Admitting: Student

## 2022-04-12 ENCOUNTER — Encounter: Payer: Self-pay | Admitting: Student

## 2022-04-12 ENCOUNTER — Encounter: Payer: Self-pay | Admitting: Dietician

## 2022-04-12 VITALS — BP 133/83 | HR 81 | Temp 97.6°F | Ht 67.0 in | Wt 162.1 lb

## 2022-04-12 DIAGNOSIS — E1142 Type 2 diabetes mellitus with diabetic polyneuropathy: Secondary | ICD-10-CM | POA: Diagnosis not present

## 2022-04-12 DIAGNOSIS — N644 Mastodynia: Secondary | ICD-10-CM

## 2022-04-12 DIAGNOSIS — Z7984 Long term (current) use of oral hypoglycemic drugs: Secondary | ICD-10-CM

## 2022-04-12 DIAGNOSIS — M79622 Pain in left upper arm: Secondary | ICD-10-CM | POA: Diagnosis not present

## 2022-04-12 DIAGNOSIS — G629 Polyneuropathy, unspecified: Secondary | ICD-10-CM

## 2022-04-12 DIAGNOSIS — N9089 Other specified noninflammatory disorders of vulva and perineum: Secondary | ICD-10-CM

## 2022-04-12 MED ORDER — LIDOCAINE 5 % EX PTCH
1.0000 | MEDICATED_PATCH | CUTANEOUS | 0 refills | Status: DC
Start: 2022-04-12 — End: 2022-08-01

## 2022-04-12 NOTE — Assessment & Plan Note (Addendum)
Assessment: Patient continues to endorse left breast pain. She states this pain has been present for over 12 years. The breast pain is located centrally, above the nipple. She notes this is similar to the pain she has had in the past. Denies injury to the area. Denies nipple discharge or any feeling any masses. She gives herself self breast examinations in the shower daily. On my exam, visual inspection unremarkable. No masses or lumps palpated. Tender to palpate central breast above nipple. No deformity of the nipple. Chest wall is non-tender to palpate. Recent mammogram with the posterior upper left breast calcifications that believe are benign and have not grown in last year. She receives annual mammograms.   Unclear etiology of pain, with benign exam. Calcifications stable as of a few months ago. If pain persists can consider breast MRI for further evaluation. Prior evaluations it was though pain was neuropathic. Gabapentin no longer on medication list and patient was confusing this medication for ozempic. It appears on further chart review she is prescribed lyrica for her neuropathy.   Plan: -conservative management, continue lyrica -if pain worsens, consider breast MRI -continue annual mammograms, spring 2024

## 2022-04-12 NOTE — Patient Instructions (Signed)
Thank you, [name] for allowing [place] to provide your care today. Today we discussed.  Muscle Pain Please apply biofreeze and lidocaine patches. Continue gabapentin  OBGYN Please follow up with OBGYN as instructed by Dr. Howie Ill    I have ordered the following labs for you:  Lab Orders  No laboratory test(s) ordered today    Referrals ordered today:   Referral Orders  No referral(s) requested today     I have ordered the following medication/changed the following medications:   Stop the following medications: There are no discontinued medications.   Start the following medications: No orders of the defined types were placed in this encounter.    Follow up:  1 month for pain follow up     Should you have any questions or concerns please call the internal medicine clinic at [phone].    Sanjuana Letters, D.O. [place]

## 2022-04-12 NOTE — Assessment & Plan Note (Addendum)
Assessment: Patient continues to endorse vaginal irration and dryness, she is requesting creams to help with this. On further chart review it appears that she has a labial lesion that a referral to OBGYN was placed to. She was uncertain of this and did not follow up. Will place new referral and discussed her following up with OBGYN. Discussed avoiding estrogen creams as they may worsen lesion  Plan: -follow up OBGYN

## 2022-04-12 NOTE — Assessment & Plan Note (Addendum)
Assessment: Patient presents today with left sided axillary/back pain. When initially asked where she is having pain she notes it goes from her left breast to her left axilla. On further questioning these are two different pains (see breast pain tab). The axillary the pain started 3 months ago but worsened over the past few days. She denies any trauma or injury to the area. She denies any physical exertion that would lead to this pain. She describes the pain as a sharp pain that originates in the posterior axilla/left upper back region. She has never had this pain in the past. It improved with lidocaine patches and topical medications.   On physical visual inspection of the area is unremarkable. No deformities or masses are palpated in the axillary region. No axillary lymphadenopathy present. When palpating, the pain is located on the posterior portion of the axilla, hard to deduce if insertion point of posterior deltoid vs latissimus doris. Non-tender when palpating the scapula and no deformities noted. Pain exacerbated with abduction of the left shoulder. Pain not exacerbated by left shoulder flexion and adduction  Patient's history, improvement with topical medications and physical exam consistent with MSK injury. Unclear etiology but do not believe imaging is warranted. Discussed using topical medications and tylenol for continued pain. Will refill lidocaine patches.   Plan: -Continue conservative managment

## 2022-04-12 NOTE — Progress Notes (Signed)
CC: left breast/axillary pain  HPI:  Bridget Owens is a 66 y.o. female living with a history stated below and presents today for left axillary pain. Please see problem based assessment and plan for additional details.  Past Medical History:  Diagnosis Date   Adenomatous colon polyp 6/09   Resected on colonoscopy, no high grade dysplasia.    Allergic rhinitis    At risk for polypharmacy 02/27/2018   Bell's palsy 01/2009   Left sided   Chest pain    Exercise stress test negative 6/07   Colon polyps    03/22/2008: adenomatous polyps x3 w no dysplasia. Needs repeat colonoscopy in 3 years   Diabetic peripheral neuropathy (HCC)    External hemorrhoid    GERD (gastroesophageal reflux disease)    Hyperlipidemia    Hypertension    Hypertensive retinopathy    Followed by Dr. Herbert Deaner.   Insomnia    Intolerance, drug    Leg cramps on Lipitor   Lipoma 12/11   Posterior neck.    Osteoarthritis    Carpometacarpal joint of right thumb   Postmenopausal bleeding 9/05   Endometrial biopsy showed  FOCAL TUBAL METAPLASIA    Postmenopausal symptoms    Hot flashes, vaginal dryness, iritability, difficulty sleeping. as of 2005.   Sebaceous cyst of breast    Has been refered to derm.   Trigger finger    Right thumb   Type II diabetes mellitus (HCC)    Uterine fibroid     Current Outpatient Medications on File Prior to Visit  Medication Sig Dispense Refill   Accu-Chek FastClix Lancets MISC Check up to two times a day. 204 each 3   amLODipine (NORVASC) 10 MG tablet Take 1 tablet (10 mg total) by mouth every evening. 90 tablet 2   aspirin 81 MG EC tablet Take 81 mg by mouth daily.       atorvastatin (LIPITOR) 40 MG tablet TAKE 1 TABLET(40 MG) BY MOUTH DAILY 90 tablet 3   BISACODYL 5 MG EC tablet Take by mouth as directed.     Blood Glucose Monitoring Suppl (ACCU-CHEK GUIDE) w/Device KIT USE AS DIRECTED 1 kit 0   cetirizine HCl (ZYRTEC) 5 MG/5ML SOLN Take 5 mLs (5 mg total) by mouth daily.  118 mL 3   clotrimazole (LOTRIMIN) 1 % cream Apply 1 application. topically 2 (two) times daily. To area under the breasts 30 g 0   diclofenac Sodium (VOLTAREN) 1 % GEL Apply 2 g topically daily as needed. 2 g 0   glucose blood (ACCU-CHEK GUIDE) test strip CHECK BLOOD SUGAR UP TO TWICE DAILY 200 strip 3   hydrochlorothiazide (HYDRODIURIL) 25 MG tablet Take 1 tablet (25 mg total) by mouth daily. 90 tablet 2   Insulin Pen Needle (PEN NEEDLES) 31G X 5 MM MISC Inject 1 applicator into the skin once a week. diag code E11.42. insulin dependent 30 each 1   meloxicam (MOBIC) 7.5 MG tablet Take 1 tablet (7.5 mg total) by mouth daily. 30 tablet 2   metFORMIN (GLUCOPHAGE) 1000 MG tablet Take 1 tablet (1,000 mg total) by mouth 2 (two) times daily with a meal. TAKE 1 TABLET(1000 MG) BY MOUTH TWICE DAILY WITH A MEAL 180 tablet 3   metFORMIN (GLUCOPHAGE) 500 MG tablet 2 tablets     polyethylene glycol-electrolytes (NULYTELY) 420 g solution Take by mouth as directed.     pregabalin (LYRICA) 50 MG capsule Take 1 capsule (50 mg total) by mouth in the morning, at  noon, and at bedtime. 90 capsule 2   Semaglutide,0.25 or 0.5MG/DOS, (OZEMPIC, 0.25 OR 0.5 MG/DOSE,) 2 MG/1.5ML SOPN Inject 0.5 mg into the skin once a week. 4.5 mL 0   Sodium Chloride-Sodium Bicarb (NETI POT SINUS WASH) 2300-700 MG KIT Use to wash out sinuses twice daily as needed 1 kit 0   No current facility-administered medications on file prior to visit.    Family History  Problem Relation Age of Onset   Pneumonia Mother    Diabetes Mother    Early death Father    Diabetes Sister    Diabetes Brother    Diabetes Maternal Grandmother     Social History   Socioeconomic History   Marital status: Single    Spouse name: Not on file   Number of children: Not on file   Years of education: Not on file   Highest education level: Not on file  Occupational History   Not on file  Tobacco Use   Smoking status: Every Day    Packs/day: 0.50     Years: 47.00    Total pack years: 23.50    Types: Cigarettes   Smokeless tobacco: Former    Quit date: 11/28/2010   Tobacco comments:    1 PPD  Substance and Sexual Activity   Alcohol use: No    Alcohol/week: 0.0 standard drinks of alcohol   Drug use: No   Sexual activity: Not on file  Other Topics Concern   Not on file  Social History Narrative   Not on file   Social Determinants of Health   Financial Resource Strain: Low Risk  (12/23/2018)   Overall Financial Resource Strain (CARDIA)    Difficulty of Paying Living Expenses: Not very hard  Food Insecurity: No Food Insecurity (03/19/2022)   Hunger Vital Sign    Worried About Running Out of Food in the Last Year: Never true    Eldred in the Last Year: Never true  Transportation Needs: No Transportation Needs (03/19/2022)   PRAPARE - Hydrologist (Medical): No    Lack of Transportation (Non-Medical): No  Physical Activity: Sufficiently Active (12/23/2018)   Exercise Vital Sign    Days of Exercise per Week: 7 days    Minutes of Exercise per Session: 50 min  Stress: Not on file  Social Connections: Not on file  Intimate Partner Violence: Not on file    Review of Systems: ROS negative except for what is noted on the assessment and plan.  Vitals:   04/12/22 1506 04/12/22 1547  BP: (!) 146/69 133/83  Pulse: 92 81  Temp: 97.6 F (36.4 C)   TempSrc: Oral   SpO2: 97%   Weight: 162 lb 1.6 oz (73.5 kg)   Height: 5' 7"  (1.702 m)     Physical Exam: Constitutional: well-appearing, no acute distress HENT: normocephalic atraumatic Eyes: conjunctiva non-erythematous Neck: supple Cardiovascular: regular rate and rhythm, no m/r/g Pulmonary/Chest: normal work of breathing on room air, lungs clear to auscultation bilaterally MSK: normal bulk and tone. Left axilla tender to palpate. No deformities or masses.  Chest wall non-tender. Left shoulder abduction limited secondary to axillary pain. Left  breast tender to palpate centrally, superior to nipple. No nipple deformities or masses. Neurological: alert & oriented x 3 Skin: warm and dry Psych: normal mood and thought process  Assessment & Plan:   Lesion of labia Assessment: Patient continues to endorse vaginal irration and dryness, she is requesting creams to help  with this. On further chart review it appears that she has a labial lesion that a referral to OBGYN was placed to. She was uncertain of this and did not follow up. Will place new referral and discussed her following up with OBGYN. Discussed avoiding estrogen creams as they may worsen lesion  Plan: -follow up OBGYN  Left axillary pain Assessment: Patient presents today with left sided axillary/back pain. When initially asked where she is having pain she notes it goes from her left breast to her left axilla. On further questioning these are two different pains (see breast pain tab). The axillary the pain started 3 months ago but worsened over the past few days. She denies any trauma or injury to the area. She denies any physical exertion that would lead to this pain. She describes the pain as a sharp pain that originates in the posterior axilla/left upper back region. She has never had this pain in the past. It improved with lidocaine patches and topical medications.   On physical visual inspection of the area is unremarkable. No deformities or masses are palpated in the axillary region. No axillary lymphadenopathy present. When palpating, the pain is located on the posterior portion of the axilla, hard to deduce if insertion point of posterior deltoid vs latissimus doris. Non-tender when palpating the scapula and no deformities noted.   Patient's history, improvement with topical medications and physical exam consistent with MSK injury. Unclear etiology but do not believe imaging is warranted. Discussed using topical medications and tylenol for continued pain. Will refill  lidocaine patches.   Plan: -Continue conservative managment  Breast pain, left Assessment: Patient continues to endorse left breast pain. She states this pain has been present for over 12 years. The breast pain is located centrally, above the nipple. She notes this is similar to the pain she has had in the past. Denies injury to the area. Denies nipple discharge or any feeling any masses. She gives herself self breast examinations in the shower daily. On my exam, visual inspection unremarkable. No masses or lumps palpated. Tender to palpate central breast above nipple. No deformity of the nipple. Chest wall is non-tender to palpate. Recent mammogram with the posterior upper left breast calcifications that believe are benign and have not grown in last year. She receives annual mammograms.   Unclear etiology of pain, with benign exam. Calcifications stable as of a few months ago. If pain persists can consider breast MRI for further evaluation. Prior evaluations it was though pain was neuropathic. Gabapentin no longer on medication list and patient was confusing this medication for ozempic. It appears on further chart review she is prescribed lyrica for her neuropathy.   Plan: -conservative management, continue lyrica -if pain worsens, consider breast MRI -continue annual mammograms, spring 2024  Patient discussed with Dr. Newell Coral, D.O. [facility], PGY-3 Phone: [phone] Date 04/12/2022 Time 8:40 PM

## 2022-04-18 ENCOUNTER — Telehealth: Payer: Self-pay

## 2022-04-18 NOTE — Telephone Encounter (Signed)
Rx for Lidocaine 5% patches was sent to Pharmacy on 7/6. This most likely needs a PA. Spoke with patient and explained she can get 4% patches OTC. She was very Patent attorney.

## 2022-04-18 NOTE — Telephone Encounter (Signed)
Pt states there's no medication for her at the pharmacy, pt do not know the name of the medication. Please call pt back.

## 2022-04-23 ENCOUNTER — Telehealth: Payer: Self-pay

## 2022-04-23 NOTE — Telephone Encounter (Signed)
Pa for pt ( LIDOCAINE  5 % ) came through on cover my meds was  submitted with office last office notes .Marland Kitchen Awaiting approval or denial

## 2022-04-23 NOTE — Telephone Encounter (Signed)
DECISION :     Out come   Denied today   You asked for the drug above for your unspecified dorsalgia, unspecified pain in leg, and acute back pain. This is an off-label use that is not medically accepted. The Medicare rule in the Prescription Drug Benefit Manual (Chapter 6, Section 10.6) says a drug must be used for a medically accepted indication (covered use). Off-label use is medically accepted when there is proof in one or more of the drug guides that the drug works for your condition. We look at the two major drug guides (compendia): the [facility] and the [employer] (AHFS-DI). [employer] has decided that there is no proof in either drug guide that this drug works for your condition. Per Medicare rules, it is not covered.   Drug Lidocaine 5% patches   Form [employer] Electronic PA Form

## 2022-04-28 NOTE — Progress Notes (Signed)
.  att

## 2022-05-17 ENCOUNTER — Telehealth: Payer: Self-pay

## 2022-05-17 NOTE — Telephone Encounter (Signed)
Returned call to patient. States she heard on TV that metformin has been recalled as it causes cancer. She is advised to contact her Pharmacist for any information on recall. States she will call them now.

## 2022-05-17 NOTE — Telephone Encounter (Signed)
Requesting to speak with a nurse about recalled on metFORMIN (GLUCOPHAGE) 1000 MG tablet, please call pt back.

## 2022-06-15 ENCOUNTER — Telehealth: Payer: Self-pay

## 2022-06-15 ENCOUNTER — Other Ambulatory Visit: Payer: Self-pay | Admitting: Internal Medicine

## 2022-06-15 DIAGNOSIS — G629 Polyneuropathy, unspecified: Secondary | ICD-10-CM

## 2022-06-15 MED ORDER — PREGABALIN 50 MG PO CAPS: 50.0000 mg | ORAL_CAPSULE | Freq: Three times a day (TID) | ORAL | 2 refills | Status: DC

## 2022-06-15 NOTE — Telephone Encounter (Signed)
Call t patient informed her that she is no longer on Gabapentin and that the doctor has sent in a prescription for Lyricia.  Patient voiced understanding of and will take the Lyrica for her pain.

## 2022-06-15 NOTE — Telephone Encounter (Signed)
Refill for Gabapentin requested by patient.

## 2022-06-15 NOTE — Progress Notes (Signed)
Received message for refill of patients gabapentin. On chart review she is not taking gabapentin. Called patient and meant lyrica. PDMP reviewed. She has not refilled medication since 2/23, but states that she only ran out of medication recently. P: Lyrica '50mg'$  refilled and sent to pharmacy

## 2022-06-15 NOTE — Telephone Encounter (Signed)
Gabapentin [facility]

## 2022-06-19 ENCOUNTER — Encounter: Payer: Self-pay | Admitting: Obstetrics and Gynecology

## 2022-06-19 ENCOUNTER — Ambulatory Visit (INDEPENDENT_AMBULATORY_CARE_PROVIDER_SITE_OTHER): Payer: Medicare Other | Admitting: Obstetrics and Gynecology

## 2022-06-19 VITALS — BP 176/71 | HR 80 | Ht 65.0 in | Wt 167.0 lb

## 2022-06-19 DIAGNOSIS — B3731 Acute candidiasis of vulva and vagina: Secondary | ICD-10-CM | POA: Diagnosis not present

## 2022-06-19 MED ORDER — NYSTATIN-TRIAMCINOLONE 100000-0.1 UNIT/GM-% EX OINT: 1.0000 | TOPICAL_OINTMENT | Freq: Two times a day (BID) | CUTANEOUS | 0 refills | Status: DC

## 2022-06-19 MED ORDER — FLUCONAZOLE 150 MG PO TABS: 150.0000 mg | ORAL_TABLET | Freq: Once | ORAL | 6 refills | Status: AC

## 2022-06-19 NOTE — Progress Notes (Signed)
66 yo postmenopausal referred for the evaluation of labial lesion. Patient reports vaginal irritation. She describes it as pruritis and burning intermittently. This temporarily resolves following treatment for yeast infection. She reports that this has been on going for the past 6 months. She denies any episodes of postmenopausal vaginal bleeding. She is not sexually active. She denies any urinary incontinence.   Past Medical History:  Diagnosis Date   Adenomatous colon polyp 6/09   Resected on colonoscopy, no high grade dysplasia.    Allergic rhinitis    At risk for polypharmacy 02/27/2018   Bell's palsy 01/2009   Left sided   Chest pain    Exercise stress test negative 6/07   Colon polyps    03/22/2008: adenomatous polyps x3 w no dysplasia. Needs repeat colonoscopy in 3 years   Diabetic peripheral neuropathy (HCC)    External hemorrhoid    GERD (gastroesophageal reflux disease)    Hyperlipidemia    Hypertension    Hypertensive retinopathy    Followed by Dr. Herbert Deaner.   Insomnia    Intolerance, drug    Leg cramps on Lipitor   Lipoma 12/11   Posterior neck.    Osteoarthritis    Carpometacarpal joint of right thumb   Postmenopausal bleeding 9/05   Endometrial biopsy showed  FOCAL TUBAL METAPLASIA    Postmenopausal symptoms    Hot flashes, vaginal dryness, iritability, difficulty sleeping. as of 2005.   Sebaceous cyst of breast    Has been refered to derm.   Trigger finger    Right thumb   Type II diabetes mellitus (HCC)    Uterine fibroid    Past Surgical History:  Procedure Laterality Date   TUBAL LIGATION     Family History  Problem Relation Age of Onset   Pneumonia Mother    Diabetes Mother    Early death Father    Diabetes Sister    Diabetes Brother    Diabetes Maternal Grandmother    Social History   Tobacco Use   Smoking status: Every Day    Packs/day: 0.50    Years: 47.00    Total pack years: 23.50    Types: Cigarettes   Smokeless tobacco: Former    Quit  date: 11/28/2010   Tobacco comments:    1 PPD  Vaping Use   Vaping Use: Never used  Substance Use Topics   Alcohol use: No    Alcohol/week: 0.0 standard drinks of alcohol   Drug use: No   ROS See pertinent in HPI. All other systems reviewed and non contributory Blood pressure (!) 176/71, pulse 80, height '5\' 5"'$  (1.651 m), weight 167 lb (75.8 kg). GENERAL: Well-developed, well-nourished female in no acute distress.  ABDOMEN: Soft, nontender, nondistended. No organomegaly. PELVIC: Normal external female genitalia with discrete areas of skin discoloration between labia majora and minora concerning for yeast infection. Vagina is pale and atrophic.  Normal discharge. Chaperone present during the pelvic exam EXTREMITIES: No cyanosis, clubbing, or edema, 2+ distal pulses.  A/P 66 yo postmenopausal with likely yeast infection - Rx diflucan provided - Rx mycolog cream provided - RTC in 1 month if no improvement - Patient current on mammogram and colonoscopy

## 2022-06-22 ENCOUNTER — Ambulatory Visit: Payer: Medicare Other | Admitting: Podiatry

## 2022-06-26 ENCOUNTER — Ambulatory Visit: Payer: Medicare Other | Admitting: Podiatry

## 2022-06-27 ENCOUNTER — Encounter: Payer: Self-pay | Admitting: Internal Medicine

## 2022-06-27 ENCOUNTER — Telehealth: Payer: Self-pay

## 2022-06-27 ENCOUNTER — Other Ambulatory Visit: Payer: Self-pay

## 2022-06-27 ENCOUNTER — Ambulatory Visit (INDEPENDENT_AMBULATORY_CARE_PROVIDER_SITE_OTHER): Payer: Medicare Other | Admitting: Internal Medicine

## 2022-06-27 ENCOUNTER — Encounter: Payer: Medicare Other | Admitting: Student

## 2022-06-27 VITALS — BP 134/73 | HR 62 | Temp 98.0°F | Ht 66.0 in | Wt 167.1 lb

## 2022-06-27 DIAGNOSIS — E1142 Type 2 diabetes mellitus with diabetic polyneuropathy: Secondary | ICD-10-CM

## 2022-06-27 DIAGNOSIS — Z7985 Long-term (current) use of injectable non-insulin antidiabetic drugs: Secondary | ICD-10-CM

## 2022-06-27 DIAGNOSIS — Z7984 Long term (current) use of oral hypoglycemic drugs: Secondary | ICD-10-CM | POA: Diagnosis not present

## 2022-06-27 DIAGNOSIS — I10 Essential (primary) hypertension: Secondary | ICD-10-CM

## 2022-06-27 DIAGNOSIS — F17210 Nicotine dependence, cigarettes, uncomplicated: Secondary | ICD-10-CM

## 2022-06-27 DIAGNOSIS — E7841 Elevated Lipoprotein(a): Secondary | ICD-10-CM

## 2022-06-27 LAB — POCT GLYCOSYLATED HEMOGLOBIN (HGB A1C): Hemoglobin A1C: 9.1 % — AB (ref 4.0–5.6)

## 2022-06-27 LAB — GLUCOSE, CAPILLARY: Glucose-Capillary: 224 mg/dL — ABNORMAL HIGH (ref 70–99)

## 2022-06-27 MED ORDER — METFORMIN HCL ER 500 MG PO TB24: 1000.0000 mg | ORAL_TABLET | Freq: Two times a day (BID) | ORAL | 11 refills | Status: DC

## 2022-06-27 MED ORDER — AMLODIPINE BESYLATE 5 MG PO TABS: 5.0000 mg | ORAL_TABLET | Freq: Every day | ORAL | 11 refills | Status: AC

## 2022-06-27 MED ORDER — VALSARTAN 80 MG PO TABS: 80.0000 mg | ORAL_TABLET | Freq: Every day | ORAL | 0 refills | Status: DC

## 2022-06-27 NOTE — Patient Instructions (Addendum)
[name], it was a pleasure seeing you today! You endorsed feeling well today. Below are some of the things we talked about this visit. We look forward to seeing you in the follow up appointment!  Today we discussed: We are changing some of your medications. We are changing your blood pressure medications.  We are changing your blood pressure medicines from amlodipine 10 mg to amlodipine 5 mg daily. We are also adding Valsartan 80 mg daily and continuing the HCTZ.  We are changing your metformin to extended release as it is causing you diarrhea.  Take your lyrica three times a day to get full benefit.  We are getting lab work today.   I have ordered the following labs today:   Lab Orders         POC Hbg A1C       Referrals ordered today:   Referral Orders  No referral(s) requested today     I have ordered the following medication/changed the following medications:   Stop the following medications: Medications Discontinued During This Encounter  Medication Reason   metFORMIN (GLUCOPHAGE) 564 MG tablet Duplicate     Start the following medications: No orders of the defined types were placed in this encounter.    Follow-up: 3 week follow up blood pressure.   Please make sure to arrive 15 minutes prior to your next appointment. If you arrive late, you may be asked to reschedule.   We look forward to seeing you next time. Please call our clinic at [phone] if you have any questions or concerns. The best time to call is Monday-Friday from 9am-4pm, but there is someone available 24/7. If after hours or the weekend, call the main hospital number and ask for the Internal Medicine Resident On-Call. If you need medication refills, please notify your pharmacy one week in advance and they will send [place] a request.  Thank you for letting us take part in your care. Wishing you the best!  Thank you, Idamae Schuller, MD

## 2022-06-27 NOTE — Telephone Encounter (Signed)
valsartan (DIOVAN) 80 MG tablet, REFILL REQUEST.

## 2022-06-27 NOTE — Progress Notes (Signed)
CC: Leg pain and DMII follow up  HPI:  [name] is a 66 y.o. with medical history of HTN, HLD, DMII, GERD,  presenting to [facility] for leg pain and DMII follow up  Please see problem-based list for further details, assessments, and plans.  Past Medical History:  Diagnosis Date   Adenomatous colon polyp 6/09   Resected on colonoscopy, no high grade dysplasia.    Allergic rhinitis    At risk for polypharmacy 02/27/2018   Bell's palsy 01/2009   Left sided   Chest pain    Exercise stress test negative 6/07   Colon polyps    03/22/2008: adenomatous polyps x3 w no dysplasia. Needs repeat colonoscopy in 3 years   Diabetic peripheral neuropathy (HCC)    External hemorrhoid    GERD (gastroesophageal reflux disease)    Hyperlipidemia    Hypertension    Hypertensive retinopathy    Followed by Dr. Herbert Deaner.   Insomnia    Intolerance, drug    Leg cramps on Lipitor   Left-sided back pain 03/22/2020   Lipoma 12/11   Posterior neck.    Osteoarthritis    Carpometacarpal joint of right thumb   Postmenopausal bleeding 9/05   Endometrial biopsy showed  FOCAL TUBAL METAPLASIA    Postmenopausal symptoms    Hot flashes, vaginal dryness, iritability, difficulty sleeping. as of 2005.   Sebaceous cyst of breast    Has been refered to derm.   Trigger finger    Right thumb   Type II diabetes mellitus (HCC)    Uterine fibroid     Current Outpatient Medications (Endocrine & Metabolic):    metFORMIN (GLUCOPHAGE-XR) 500 MG 24 hr tablet, Take 2 tablets (1,000 mg total) by mouth 2 (two) times daily with a meal.   Semaglutide,0.25 or 0.5MG/DOS, (OZEMPIC, 0.25 OR 0.5 MG/DOSE,) 2 MG/1.5ML SOPN, Inject 0.5 mg into the skin once a week.  Current Outpatient Medications (Cardiovascular):    amLODipine (NORVASC) 5 MG tablet, Take 1 tablet (5 mg total) by mouth daily.   valsartan (DIOVAN) 80 MG tablet, Take 1 tablet (80 mg total) by mouth daily.   atorvastatin (LIPITOR) 40 MG tablet, TAKE 1 TABLET(40  MG) BY MOUTH DAILY   hydrochlorothiazide (HYDRODIURIL) 25 MG tablet, Take 1 tablet (25 mg total) by mouth daily.  Current Outpatient Medications (Respiratory):    cetirizine HCl (ZYRTEC) 5 MG/5ML SOLN, Take 5 mLs (5 mg total) by mouth daily.   Sodium Chloride-Sodium Bicarb (NETI POT SINUS WASH) 2300-700 MG KIT, Use to wash out sinuses twice daily as needed  Current Outpatient Medications (Analgesics):    aspirin 81 MG EC tablet, Take 81 mg by mouth daily.   (Patient not taking: Reported on 06/19/2022)   Current Outpatient Medications (Other):    Accu-Chek FastClix Lancets MISC, Check up to two times a day.   BISACODYL 5 MG EC tablet, Take by mouth as directed.   Blood Glucose Monitoring Suppl (ACCU-CHEK GUIDE) w/Device KIT, USE AS DIRECTED   clotrimazole (LOTRIMIN) 1 % cream, Apply 1 application. topically 2 (two) times daily. To area under the breasts   diclofenac Sodium (VOLTAREN) 1 % GEL, Apply 2 g topically daily as needed. (Patient not taking: Reported on 06/19/2022)   glucose blood (ACCU-CHEK GUIDE) test strip, CHECK BLOOD SUGAR UP TO TWICE DAILY   Insulin Pen Needle (PEN NEEDLES) 31G X 5 MM MISC, Inject 1 applicator into the skin once a week. diag code E11.42. insulin dependent   lidocaine (LIDODERM) 5 %,  Place 1 patch onto the skin daily. Remove & Discard patch within 12 hours or as directed by MD   nystatin-triamcinolone ointment (MYCOLOG), Apply 1 Application topically 2 (two) times daily.   polyethylene glycol-electrolytes (NULYTELY) 420 g solution, Take by mouth as directed. (Patient not taking: Reported on 06/19/2022)   pregabalin (LYRICA) 50 MG capsule, Take 1 capsule (50 mg total) by mouth in the morning, at noon, and at bedtime. (Patient not taking: Reported on 06/19/2022)  Review of Systems:  Review of system negative unless stated in the problem list or HPI.    Physical Exam:  Vitals:   06/27/22 1017  BP: 134/73  Pulse: 62  Temp: 98 F (36.7 C)  TempSrc: Oral  SpO2:  98%  Weight: 167 lb 1.6 oz (75.8 kg)  Height: 5' 6"  (1.676 m)    Physical Exam General: NAD HENT: NCAT Lungs: CTAB, no wheeze, rhonchi or rales.  Cardiovascular: Normal heart sounds, no r/m/g, 2+ pulses in all extremities. No LE edema Abdomen: No TTP, normal bowel sounds MSK: No asymmetry or muscle atrophy.  Skin: no lesions noted on exposed skin Neuro: Alert and oriented x4. CN grossly intact Psych: Normal mood and normal affect   Assessment & Plan:   Essential hypertension Home regimen is Amlodipine 10 mg qd, HCTZ 25 mg. BP in the clinic is 134/73. Does not check  blood pressure at home. PT states she is having leg and ankle swelling. No LE edema appreciated on exam. Given leg swelling, will decrease amlodipine and start valsartan 80 mg qd. Will get BMP at 3 week follow up.    Type 2 diabetes mellitus with peripheral neuropathy (HCC) A1c 6.9 in 12/12/21. 9.1 this visit. Home regimen is Ozempic 0.5 mg weekly. Metformin 1000 mg BID. Pt states she misses her medications from time to time without providing any further details. Patient is taking Lyrica 50 mg TID as prn for her neuropathy. Does endorse feeling nauseous and has diarrhea. Metformin changed to extended release. She may not be adhering to metformin strictly given her diarrhea. Will switch to long acting metformin given the side effects but keep the regimen the same as she was controlled on this regimen.  -Advised pt to bring glucose meter to follow up appointment.  -Advised pt to take lyrica as scheduled rather than PRN to prevent her neuropathy from flaring up.   Hyperlipidemia Pt will need lipid panel with lab work in 3 weeks as the last one was over 1 year ago. Currently on lipitor 40 mg qd.    See Encounters Tab for problem based charting.  Patient discussed with Dr. Roderic Ovens, MD Tillie Rung. [facility] Residency, PGY-2

## 2022-06-27 NOTE — Telephone Encounter (Signed)
This was filled at patient's OV today.

## 2022-06-28 NOTE — Progress Notes (Signed)
Internal Medicine Clinic Attending  Case discussed with the resident at the time of the visit.  We reviewed the resident's history and exam and pertinent patient test results.  I agree with the assessment, diagnosis, and plan of care documented in the resident's note.  

## 2022-06-29 ENCOUNTER — Encounter: Payer: Self-pay | Admitting: Internal Medicine

## 2022-06-29 NOTE — Assessment & Plan Note (Signed)
Home regimen is Amlodipine 10 mg qd, HCTZ 25 mg. BP in the clinic is 134/73. Does not check  blood pressure at home. PT states she is having leg and ankle swelling. No LE edema appreciated on exam. Given leg swelling, will decrease amlodipine and start valsartan 80 mg qd. Will get BMP at 3 week follow up.

## 2022-06-29 NOTE — Assessment & Plan Note (Addendum)
A1c 6.9 in 12/12/21. 9.1 this visit. Home regimen is Ozempic 0.5 mg weekly. Metformin 1000 mg BID. Pt states she misses her medications from time to time without providing any further details. Patient is taking Lyrica 50 mg TID as prn for her neuropathy. Does endorse feeling nauseous and has diarrhea. Metformin changed to extended release. She may not be adhering to metformin strictly given her diarrhea. Will switch to long acting metformin given the side effects but keep the regimen the same as she was controlled on this regimen.  -Advised pt to bring glucose meter to follow up appointment.  -Advised pt to take lyrica as scheduled rather than PRN to prevent her neuropathy from flaring up.

## 2022-06-29 NOTE — Assessment & Plan Note (Addendum)
Pt will need lipid panel with lab work in 3 weeks as the last one was over 1 year ago. Currently on lipitor 40 mg qd.

## 2022-06-29 NOTE — Telephone Encounter (Signed)
Patient called in stating her insurance will not cover valsartan. They did not give her an out of pocket cost.

## 2022-07-01 MED ORDER — LOSARTAN POTASSIUM 50 MG PO TABS: 50.0000 mg | ORAL_TABLET | Freq: Every day | ORAL | 0 refills | Status: DC

## 2022-07-01 NOTE — Addendum Note (Signed)
Addended by: Idamae Schuller on: 07/01/2022 06:59 AM   Modules accepted: Orders

## 2022-07-02 NOTE — Telephone Encounter (Signed)
Attempted to notify patient that Rx for losartan was sent to [facility] to replace valsartan. No answer and no VM has been set up.

## 2022-07-04 ENCOUNTER — Ambulatory Visit: Payer: Medicare Other | Admitting: Podiatry

## 2022-07-19 NOTE — Telephone Encounter (Signed)
done

## 2022-07-20 ENCOUNTER — Encounter: Payer: Self-pay | Admitting: Podiatry

## 2022-07-20 ENCOUNTER — Ambulatory Visit (INDEPENDENT_AMBULATORY_CARE_PROVIDER_SITE_OTHER): Payer: Medicare Other | Admitting: Podiatry

## 2022-07-20 DIAGNOSIS — Q828 Other specified congenital malformations of skin: Secondary | ICD-10-CM

## 2022-07-20 DIAGNOSIS — E1142 Type 2 diabetes mellitus with diabetic polyneuropathy: Secondary | ICD-10-CM

## 2022-07-20 DIAGNOSIS — M79675 Pain in left toe(s): Secondary | ICD-10-CM | POA: Diagnosis not present

## 2022-07-20 DIAGNOSIS — M79674 Pain in right toe(s): Secondary | ICD-10-CM

## 2022-07-20 DIAGNOSIS — B351 Tinea unguium: Secondary | ICD-10-CM

## 2022-07-20 NOTE — Progress Notes (Signed)
  Subjective:  Patient ID: Bridget Owens, female    DOB: 09-Feb-1956,  MRN: [id number]  Bridget Owens presents to clinic today for:  Chief Complaint  Patient presents with   Nail Problem    Diabetic foot care BS- did not check today A1C-do not know PCP-Katie Masters PCP VST-05/2022    New problem(s): None. {jgcomplaint:23593}  PCP is Masters, Joellen Jersey, DO , and last visit was  {Time; dates multiple:15870}.  Allergies  Allergen Reactions   Codeine Other (See Comments)    "felt funny"    Review of Systems: Negative except as noted in the HPI.  Objective: No changes noted in today's physical examination.  Bridget Owens is a pleasant 66 y.o. female {jgbodyhabitus:24098} Bridget Owens x 3.  Vascular CFT <3 seconds b/l LE. Palpable pedal pulses b/l LE. Pedal hair absent. No pain with calf compression b/l. Lower extremity skin temperature gradient within normal limits. No edema noted b/l LE. No cyanosis or clubbing noted b/l LE.  Neurologic Normal speech. Oriented to person, place, and time. Pt has subjective symptoms of neuropathy. Protective sensation intact 5/5 intact bilaterally with 10g monofilament b/l. Vibratory sensation intact b/l.  Dermatologic Pedal skin is warm and supple b/l LE. No open wounds b/l LE. No interdigital macerations noted b/l LE. Toenails 1-5 b/l elongated, discolored, dystrophic, thickened, crumbly with subungual debris and tenderness to dorsal palpation. Porokeratotic lesion(s) submet head 1 right foot and submet head 5 b/l. No erythema, no edema, no drainage, no fluctuance.  Orthopedic: Muscle strength 5/5 to all lower extremity muscle groups bilaterally. Tailor's bunion deformity noted b/l LE.   Radiographs: None Assessment/Plan: 1. Pain due to onychomycosis of toenails of both feet   2. Porokeratosis   3. Diabetic peripheral neuropathy associated with type 2 diabetes mellitus ([place])     No orders of the defined types were placed in this encounter.    {Jgplan:23602::"-Patient/POA to call should there be question/concern in the interim."}   Return in about 3 months (around 10/20/2022).  Marzetta Board, DPM

## 2022-07-24 ENCOUNTER — Telehealth: Payer: Self-pay | Admitting: *Deleted

## 2022-07-24 NOTE — Telephone Encounter (Signed)
Patient called in stating her lip started swelling today. States she thinks it is a return of bell's palsy. Last episode was 2010. Denies throat sx, CP, SHOB, balance issues. Denies any new meds. First available appt given for tomorrow at 0945. She understands if she develops any sx as above to get to the nearest ED. She agrees.

## 2022-07-25 ENCOUNTER — Encounter: Payer: Medicare Other | Admitting: Family Medicine

## 2022-07-27 ENCOUNTER — Other Ambulatory Visit: Payer: Self-pay | Admitting: Family Medicine

## 2022-08-01 ENCOUNTER — Ambulatory Visit (INDEPENDENT_AMBULATORY_CARE_PROVIDER_SITE_OTHER): Payer: Medicare Other | Admitting: Internal Medicine

## 2022-08-01 ENCOUNTER — Other Ambulatory Visit: Payer: Self-pay

## 2022-08-01 ENCOUNTER — Encounter: Payer: Self-pay | Admitting: Internal Medicine

## 2022-08-01 VITALS — BP 137/67 | HR 77 | Temp 98.1°F | Ht 65.0 in | Wt 167.8 lb

## 2022-08-01 DIAGNOSIS — E785 Hyperlipidemia, unspecified: Secondary | ICD-10-CM

## 2022-08-01 DIAGNOSIS — R10811 Right upper quadrant abdominal tenderness: Secondary | ICD-10-CM

## 2022-08-01 DIAGNOSIS — R10816 Epigastric abdominal tenderness: Secondary | ICD-10-CM

## 2022-08-01 DIAGNOSIS — E1142 Type 2 diabetes mellitus with diabetic polyneuropathy: Secondary | ICD-10-CM

## 2022-08-01 DIAGNOSIS — I10 Essential (primary) hypertension: Secondary | ICD-10-CM

## 2022-08-01 MED ORDER — LIDOCAINE 5 % EX PTCH: 1.0000 | MEDICATED_PATCH | CUTANEOUS | 0 refills | Status: AC

## 2022-08-01 MED ORDER — METFORMIN HCL ER 500 MG PO TB24: 500.0000 mg | ORAL_TABLET | Freq: Two times a day (BID) | ORAL | 3 refills | Status: DC

## 2022-08-01 NOTE — Patient Instructions (Signed)
Thank you, [name] for allowing [place] to provide your care today.   Pain under breast I think this is consistent with nerve pain.  Please try using lidocaine patch to see if this helps.  Also continue using Voltaren gel.  Diabetes I am glad that decreasing the metformin dose helped with your diarrhea.  Please continue taking metformin and once weekly shot of Ozempic.  Follow-up next month and we will repeat your diabetes blood work to see where you are at.  Blood Pressure I am checking some labs today to make sure with the new blood pressure medications that your electrolytes are still okay.  I will call you with these results.  Cholesterol Please continue taking atorvastatin.  I am rechecking a cholesterol level today and we will call you with those results.   I have ordered the following labs for you:   Lab Orders         Lipid Profile         BMP8+Anion Gap       Referrals ordered today:   Referral Orders  No referral(s) requested today     I have ordered the following medication/changed the following medications:   Stop the following medications: Medications Discontinued During This Encounter  Medication Reason   Insulin Pen Needle (PEN NEEDLES) 31G X 5 MM MISC    polyethylene glycol-electrolytes (NULYTELY) 420 g solution    metFORMIN (GLUCOPHAGE-XR) 500 MG 24 hr tablet Reorder     Start the following medications: Meds ordered this encounter  Medications   metFORMIN (GLUCOPHAGE-XR) 500 MG 24 hr tablet    Sig: Take 1 tablet (500 mg total) by mouth 2 (two) times daily with a meal.    Dispense:  180 tablet    Refill:  3     Follow up:  1 month    We look forward to seeing you next time. Please call our clinic at [phone] if you have any questions or concerns. The best time to call is Monday-Friday from 9am-4pm, but there is someone available 24/7. If after hours or the weekend, call the main hospital number and ask for the Internal Medicine Resident  On-Call. If you need medication refills, please notify your pharmacy one week in advance and they will send [place] a request.   Thank you for trusting me with your care. Wishing you the best!   Christiana Fuchs, [place]

## 2022-08-01 NOTE — Progress Notes (Unsigned)
Subjective:  CC: follow-up on HTN  HPI:  Bridget Owens is a 66 y.o. female with a past medical history stated below and presents today for follow-up on hypertension.  Blood pressure medications were changed at office visit in September due to lower extremity edema.  We also discussed the pain she is having in her upper abdomen, and diabetes management. Please see problem based assessment and plan for additional details.  Past Medical History:  Diagnosis Date   Abdominal pain 03/14/2015   Adenomatous colon polyp 03/2008   Resected on colonoscopy, no high grade dysplasia.    Allergic rhinitis    At risk for polypharmacy 02/27/2018   Bell's palsy 01/2009   Left sided   Chest pain    Exercise stress test negative 6/07   Colon polyps    03/22/2008: adenomatous polyps x3 w no dysplasia. Needs repeat colonoscopy in 3 years   Diabetic peripheral neuropathy (HCC)    External hemorrhoid    GERD (gastroesophageal reflux disease)    Hyperlipidemia    Hypertension    Hypertensive retinopathy    Followed by Dr. Herbert Deaner.   Insomnia    Intolerance, drug    Leg cramps on Lipitor   Left-sided back pain 03/22/2020   Lesion of labia 12/13/2021   Lipoma 09/2010   Posterior neck.    Need for influenza vaccination 07/01/2020   Osteoarthritis    Carpometacarpal joint of right thumb   Osteoporosis screening 02/02/2020   Postmenopausal bleeding 06/2004   Endometrial biopsy showed  FOCAL TUBAL METAPLASIA    Postmenopausal symptoms    Hot flashes, vaginal dryness, iritability, difficulty sleeping. as of 2005.   Seasonal allergies 10/06/2014   Sebaceous cyst of breast    Has been refered to derm.   Tachycardia 07/13/2021   Trigger finger    Right thumb   Type II diabetes mellitus (HCC)    Uterine fibroid    Viral URI with cough 10/11/2017   Vulvovaginitis 02/02/2020    Current Outpatient Medications on File Prior to Visit  Medication Sig Dispense Refill   OZEMPIC, 0.25 OR 0.5  MG/DOSE, 2 MG/3ML SOPN Inject 0.5 mg into the skin once a week.     Accu-Chek FastClix Lancets MISC Check up to two times a day. 204 each 3   amLODipine (NORVASC) 5 MG tablet Take 1 tablet (5 mg total) by mouth daily. 30 tablet 11   aspirin 81 MG EC tablet Take 81 mg by mouth daily.   (Patient not taking: Reported on 06/19/2022)     atorvastatin (LIPITOR) 40 MG tablet TAKE 1 TABLET(40 MG) BY MOUTH DAILY 90 tablet 3   BISACODYL 5 MG EC tablet Take by mouth as directed.     Blood Glucose Monitoring Suppl (ACCU-CHEK GUIDE) w/Device KIT USE AS DIRECTED 1 kit 0   cetirizine HCl (ZYRTEC) 5 MG/5ML SOLN Take 5 mLs (5 mg total) by mouth daily. 118 mL 3   clotrimazole (LOTRIMIN) 1 % cream Apply 1 application. topically 2 (two) times daily. To area under the breasts 30 g 0   diclofenac Sodium (VOLTAREN) 1 % GEL Apply 2 g topically daily as needed. (Patient not taking: Reported on 06/19/2022) 2 g 0   glucose blood (ACCU-CHEK GUIDE) test strip CHECK BLOOD SUGAR UP TO TWICE DAILY 200 strip 3   hydrochlorothiazide (HYDRODIURIL) 25 MG tablet Take 1 tablet (25 mg total) by mouth daily. 90 tablet 2   losartan (COZAAR) 50 MG tablet Take 1 tablet (50 mg  total) by mouth daily. 90 tablet 0   nystatin-triamcinolone ointment (MYCOLOG) Apply 1 Application topically 2 (two) times daily. 30 g 0   pregabalin (LYRICA) 50 MG capsule Take 1 capsule (50 mg total) by mouth in the morning, at noon, and at bedtime. (Patient not taking: Reported on 06/19/2022) 90 capsule 2   Sodium Chloride-Sodium Bicarb (NETI POT SINUS WASH) 2300-700 MG KIT Use to wash out sinuses twice daily as needed 1 kit 0   No current facility-administered medications on file prior to visit.    Family History  Problem Relation Age of Onset   Pneumonia Mother    Diabetes Mother    Early death Father    Diabetes Sister    Diabetes Brother    Diabetes Maternal Grandmother     Social History   Socioeconomic History   Marital status: Single    Spouse  name: Not on file   Number of children: Not on file   Years of education: Not on file   Highest education level: Not on file  Occupational History   Not on file  Tobacco Use   Smoking status: Every Day    Packs/day: 0.50    Years: 47.00    Total pack years: 23.50    Types: Cigarettes   Smokeless tobacco: Former    Quit date: 11/28/2010   Tobacco comments:    1 PPD  Vaping Use   Vaping Use: Never used  Substance and Sexual Activity   Alcohol use: No    Alcohol/week: 0.0 standard drinks of alcohol   Drug use: No   Sexual activity: Not on file  Other Topics Concern   Not on file  Social History Narrative   Not on file   Social Determinants of Health   Financial Resource Strain: Low Risk  (12/23/2018)   Overall Financial Resource Strain (CARDIA)    Difficulty of Paying Living Expenses: Not very hard  Food Insecurity: No Food Insecurity (03/19/2022)   Hunger Vital Sign    Worried About Running Out of Food in the Last Year: Never true    Ran Out of Food in the Last Year: Never true  Transportation Needs: No Transportation Needs (03/19/2022)   PRAPARE - Hydrologist (Medical): No    Lack of Transportation (Non-Medical): No  Physical Activity: Sufficiently Active (12/23/2018)   Exercise Vital Sign    Days of Exercise per Week: 7 days    Minutes of Exercise per Session: 50 min  Stress: Not on file  Social Connections: Not on file  Intimate Partner Violence: Not on file    Review of Systems: ROS negative except for what is noted on the assessment and plan.  Objective:   Vitals:   08/01/22 1058 08/01/22 1127  BP: (!) 143/76 137/67  Pulse: 75 77  Temp: 98.1 F (36.7 C)   TempSrc: Oral   SpO2: 100%   Weight: 167 lb 12.8 oz (76.1 kg)   Height: $Remove'5\' 5"'ZXyLtrr$  (1.651 m)     Physical Exam: Constitutional: well-appearing Cardiovascular: regular rate and rhythm, no m/r/g Pulmonary/Chest: normal work of breathing on room air, lungs clear to auscultation  bilaterally Abdominal: focal tenderness to skin in upper right abdomen, no erythema or tissue texture changes over site, skin appears dry but is symmtric bilaterally compared to other side that does not have pain. MSK: normal bulk and tone  Assessment & Plan:  Essential hypertension Blood pressure initially elevated at 143/76.  Recheck blood pressure under  better control at 137/67.  At last office visit her blood pressure regimen was changed as she was having lower extremity edema.  Amlodipine reduced to 5 mg hydrochlorothiazide 25 mg continued and losartan 50 mg was added.  Patient has noted improvement in swelling in her legs since change.  She denies headache, shortness of breath, chest pain, and changes in vision.  Does not check her blood pressure at home. A/P: Check BMP with addition of losartan BMP showed creatinine and potassium within normal limits. Continue amlodipine 5 mg, losartan 50 mg, and hydrochlorothiazide 25 mg  Abdominal tenderness, epigastric Patient mentioned continued abdominal pain. Pain is present worst at night. She describes this as a burning stinging feeling. She has put voltaren gel on it with some relief. On exam, she endorses pain on palpation in right upper quadrant to subcutaneous tissue. She currently takes Lyrica as well.  Prior visit: She was seen in March for this and treated with trigger point injection. This has not improved pain. Prior visit work-up negative for rib fracture.  P: Exam consistent with pain in subcutaneous tissue. I talked with patient about trying voltarn gel or lidocaine patch to see if this improves pain. The location is not consistent with gallbladder and timing at night is also less consistent with pain 2/2 to gallbladder.  Hyperlipidemia Lab Results  Component Value Date   CHOL 165 08/01/2022   HDL 49 08/01/2022   LDLCALC 99 08/01/2022   TRIG 90 08/01/2022   CHOLHDL 3.4 08/01/2022   Patient reports adherence with atorvastatin.  Repeat lipid panel showed LDL at goal for primary prevention <100.   Type 2 diabetes mellitus with peripheral neuropathy ([place]) I talked with patient about diabetes medication regimen. At last office visit metformin was decreased to 500 mg BID and she is still taking ozempic. She was not able to be adherent with higher dosing of metformin and since decreasing she has had no diarrhea and took metformin as prescribed. A/P: Continue metformin 500 mg BID XL and ozempic  Repeat a1c in 12/23.   Patient discussed with Dr. Walden Field Latavius Capizzi, D.O. [facility]  PGY-2 Pager: [phone]  Phone: [phone] Date 08/02/2022  Time 10:59 AM

## 2022-08-02 ENCOUNTER — Encounter: Payer: Self-pay | Admitting: Internal Medicine

## 2022-08-02 LAB — LIPID PANEL
Chol/HDL Ratio: 3.4 ratio (ref 0.0–4.4)
Cholesterol, Total: 165 mg/dL (ref 100–199)
HDL: 49 mg/dL (ref >39)
LDL Chol Calc (NIH): 99 mg/dL (ref 0–99)
Triglycerides: 90 mg/dL (ref 0–149)
VLDL Cholesterol Cal: 17 mg/dL (ref 5–40)

## 2022-08-02 LAB — BMP8+ANION GAP
Anion Gap: 19 mmol/L — ABNORMAL HIGH (ref 10.0–18.0)
BUN/Creatinine Ratio: 19 (ref 12–28)
BUN: 12 mg/dL (ref 8–27)
CO2: 23 mmol/L (ref 20–29)
Calcium: 9.7 mg/dL (ref 8.7–10.3)
Chloride: 97 mmol/L (ref 96–106)
Creatinine, Ser: 0.62 mg/dL (ref 0.57–1.00)
Glucose: 185 mg/dL — ABNORMAL HIGH (ref 70–99)
Potassium: 4.4 mmol/L (ref 3.5–5.2)
Sodium: 139 mmol/L (ref 134–144)
eGFR: 97 mL/min/{1.73_m2} (ref >59)

## 2022-08-02 NOTE — Assessment & Plan Note (Signed)
I talked with patient about diabetes medication regimen. At last office visit metformin was decreased to 500 mg BID and she is still taking ozempic. She was not able to be adherent with higher dosing of metformin and since decreasing she has had no diarrhea and took metformin as prescribed. A/P: Continue metformin 500 mg BID XL and ozempic  Repeat a1c in 12/23.

## 2022-08-02 NOTE — Assessment & Plan Note (Addendum)
Blood pressure initially elevated at 143/76.  Recheck blood pressure under better control at 137/67.  At last office visit her blood pressure regimen was changed as she was having lower extremity edema.  Amlodipine reduced to 5 mg hydrochlorothiazide 25 mg continued and losartan 50 mg was added.  Patient has noted improvement in swelling in her legs since change.  She denies headache, shortness of breath, chest pain, and changes in vision.  Does not check her blood pressure at home. A/P: Check BMP with addition of losartan BMP showed creatinine and potassium within normal limits. Continue amlodipine 5 mg, losartan 50 mg, and hydrochlorothiazide 25 mg

## 2022-08-02 NOTE — Assessment & Plan Note (Signed)
Patient mentioned continued abdominal pain. Pain is present worst at night. She describes this as a burning stinging feeling. She has put voltaren gel on it with some relief. On exam, she endorses pain on palpation in right upper quadrant to subcutaneous tissue. She currently takes Lyrica as well.  Prior visit: She was seen in March for this and treated with trigger point injection. This has not improved pain. Prior visit work-up negative for rib fracture.  P: Exam consistent with pain in subcutaneous tissue. I talked with patient about trying voltarn gel or lidocaine patch to see if this improves pain. The location is not consistent with gallbladder and timing at night is also less consistent with pain 2/2 to gallbladder.

## 2022-08-02 NOTE — Assessment & Plan Note (Addendum)
Lab Results  Component Value Date   CHOL 165 08/01/2022   HDL 49 08/01/2022   LDLCALC 99 08/01/2022   TRIG 90 08/01/2022   CHOLHDL 3.4 08/01/2022   Patient reports adherence with atorvastatin. Repeat lipid panel showed LDL at goal for primary prevention <100.

## 2022-08-06 NOTE — Progress Notes (Signed)
Internal Medicine Clinic Attending  Case discussed with Dr. Masters  at the time of the visit.  We reviewed the resident's history and exam and pertinent patient test results.  I agree with the assessment, diagnosis, and plan of care documented in the resident's note.  

## 2022-08-09 ENCOUNTER — Telehealth: Payer: Self-pay | Admitting: *Deleted

## 2022-08-09 NOTE — Telephone Encounter (Signed)
Call from pt c/o itching "in her private area".. States she finally realized when she takes the Ozempic shot, this happens. States she took the shot on Sunday and later that night , the itching started. States at her the last office visit, she was not given any med for the itching and now she wants something to be done.

## 2022-08-09 NOTE — Telephone Encounter (Signed)
Pt called / informed of Dr Howie Ill' response to schedule another appt. Pt stated she knows "it's from that shot". She does not want to schedule another appt; "I'm tired of coming to the doctor". Stated she's going to stop taking the shot - informed pt do not do this b/c her BS's may increase. Stated she going to buy OTC cream; pt informed to call the office if problem does not resolved.

## 2022-08-15 ENCOUNTER — Ambulatory Visit (INDEPENDENT_AMBULATORY_CARE_PROVIDER_SITE_OTHER): Payer: Medicare Other | Admitting: Student

## 2022-08-15 DIAGNOSIS — G44209 Tension-type headache, unspecified, not intractable: Secondary | ICD-10-CM | POA: Diagnosis not present

## 2022-08-15 NOTE — Progress Notes (Signed)
[facility] Internal Medicine Residency Telephone Encounter Continuity Care Appointment  HPI:  This telephone encounter was created for [name] on 08/15/2022 for the following purpose/cc: headache. Spoke with Ms. Gillooly via telephone, confirmed identity via DOB and home address. She states that she is having intermittent headaches since Sunday. Headaches last about 1-2 hours and feeling like a band is being wrapped around her head. She denies any photophobia, N/V, vision changes, or aura. She is staying well hydrated at home and has been adherent with her home antihypertensives. She has been trying to use lyrica (prescribed for neuropathy) to manage her headaches and notes some improvement but not full relief. Her headaches appear to be tension headaches given characteristics. Advised her to take tylenol or ibuprofen prn to help with management. Provided return precautions should she develop vision changes, N/V, severe headache uncontrolled with medications.   Past Medical History:  Past Medical History:  Diagnosis Date   Abdominal pain 03/14/2015   Adenomatous colon polyp 03/2008   Resected on colonoscopy, no high grade dysplasia.    Allergic rhinitis    At risk for polypharmacy 02/27/2018   Bell's palsy 01/2009   Left sided   Chest pain    Exercise stress test negative 6/07   Colon polyps    03/22/2008: adenomatous polyps x3 w no dysplasia. Needs repeat colonoscopy in 3 years   Diabetic peripheral neuropathy (HCC)    External hemorrhoid    GERD (gastroesophageal reflux disease)    Hyperlipidemia    Hypertension    Hypertensive retinopathy    Followed by Dr. Herbert Deaner.   Insomnia    Intolerance, drug    Leg cramps on Lipitor   Left-sided back pain 03/22/2020   Lesion of labia 12/13/2021   Lipoma 09/2010   Posterior neck.    Need for influenza vaccination 07/01/2020   Osteoarthritis    Carpometacarpal joint of right thumb   Osteoporosis screening 02/02/2020    Postmenopausal bleeding 06/2004   Endometrial biopsy showed  FOCAL TUBAL METAPLASIA    Postmenopausal symptoms    Hot flashes, vaginal dryness, iritability, difficulty sleeping. as of 2005.   Seasonal allergies 10/06/2014   Sebaceous cyst of breast    Has been refered to derm.   Tachycardia 07/13/2021   Trigger finger    Right thumb   Type II diabetes mellitus (HCC)    Uterine fibroid    Viral URI with cough 10/11/2017   Vulvovaginitis 02/02/2020     ROS:  Negative aside from that in HPI.   Assessment / Plan / Recommendations:  Please see A&P under problem oriented charting for assessment of the patient's acute and chronic medical conditions.  As always, pt is advised that if symptoms worsen or new symptoms arise, they should go to an urgent care facility or to to ER for further evaluation.   Consent and Medical Decision Making:  Patient discussed with Dr. Jimmye Norman This is a telephone encounter between Bridget Owens and Virl Axe on 08/15/2022 for tension-type headache. The visit was conducted with the patient located at home and Virl Axe at [facility]. The patient's identity was confirmed using their DOB and current address. The patient has consented to being evaluated through a telephone encounter and understands the associated risks (an examination cannot be done and the patient may need to come in for an appointment) / benefits (allows the patient to remain at home, decreasing exposure to coronavirus). I personally spent 18 minutes on medical discussion.

## 2022-08-19 ENCOUNTER — Other Ambulatory Visit: Payer: Self-pay | Admitting: Internal Medicine

## 2022-09-03 NOTE — Progress Notes (Signed)
Internal Medicine Clinic Attending  Case discussed with Dr. Jinwala  At the time of the visit.  We reviewed the resident's history and exam and pertinent patient test results.  I agree with the assessment, diagnosis, and plan of care documented in the resident's note.  

## 2022-09-11 ENCOUNTER — Encounter: Payer: Self-pay | Admitting: Student

## 2022-09-11 ENCOUNTER — Ambulatory Visit (INDEPENDENT_AMBULATORY_CARE_PROVIDER_SITE_OTHER): Payer: Medicare Other | Admitting: Student

## 2022-09-11 VITALS — BP 139/76 | HR 74 | Temp 97.7°F | Ht 67.0 in | Wt 167.2 lb

## 2022-09-11 DIAGNOSIS — J069 Acute upper respiratory infection, unspecified: Secondary | ICD-10-CM

## 2022-09-11 DIAGNOSIS — R0989 Other specified symptoms and signs involving the circulatory and respiratory systems: Secondary | ICD-10-CM

## 2022-09-11 NOTE — Patient Instructions (Signed)
Ms. Kyler,  It was nice seeing you in the clinic today.  I am sorry you are not feeling well.  Today we tested you for COVID or flu.  In the meantime please continue Mucinex, cough syrup and Tylenol as needed for your symptoms.  Please drink plenty of water, eat nutritious food and get plenty of rest.  I will call you for the result.  Take care  Dr. Alfonse Spruce

## 2022-09-11 NOTE — Progress Notes (Signed)
CC: Headache, congestion, cough  HPI:  Bridget Owens is a 66 y.o. with past medical history of hypertension, type 2 diabetes, pulmonary fibrosis who presents to the clinic for 3 days of headache, chest congestion, earaches, fever, chills, coughing, body aches.  Please see problem based charting for detail  Past Medical History:  Diagnosis Date   Abdominal pain 03/14/2015   Adenomatous colon polyp 03/2008   Resected on colonoscopy, no high grade dysplasia.    Allergic rhinitis    At risk for polypharmacy 02/27/2018   Bell's palsy 01/2009   Left sided   Chest pain    Exercise stress test negative 6/07   Colon polyps    03/22/2008: adenomatous polyps x3 w no dysplasia. Needs repeat colonoscopy in 3 years   Diabetic peripheral neuropathy (HCC)    External hemorrhoid    GERD (gastroesophageal reflux disease)    Hyperlipidemia    Hypertension    Hypertensive retinopathy    Followed by Dr. Herbert Deaner.   Insomnia    Intolerance, drug    Leg cramps on Lipitor   Left-sided back pain 03/22/2020   Lesion of labia 12/13/2021   Lipoma 09/2010   Posterior neck.    Need for influenza vaccination 07/01/2020   Osteoarthritis    Carpometacarpal joint of right thumb   Osteoporosis screening 02/02/2020   Postmenopausal bleeding 06/2004   Endometrial biopsy showed  FOCAL TUBAL METAPLASIA    Postmenopausal symptoms    Hot flashes, vaginal dryness, iritability, difficulty sleeping. as of 2005.   Seasonal allergies 10/06/2014   Sebaceous cyst of breast    Has been refered to derm.   Tachycardia 07/13/2021   Trigger finger    Right thumb   Type II diabetes mellitus (HCC)    Uterine fibroid    Viral URI with cough 10/11/2017   Vulvovaginitis 02/02/2020   Review of Systems:  per HPI  Physical Exam:  Vitals:   09/11/22 1448 09/11/22 1532  BP: (!) 148/74 139/76  Pulse: 80 74  Temp: 97.7 F (36.5 C)   TempSrc: Oral   SpO2: 98%   Weight: 167 lb 3.2 oz (75.8 kg)   Height: '5\' 7"'$   (1.702 m)    Physical Exam Constitutional:      General: She is not in acute distress.    Appearance: She is not ill-appearing.  HENT:     Head: Normocephalic.     Right Ear: Tympanic membrane, ear canal and external ear normal. There is no impacted cerumen.     Left Ear: Tympanic membrane, ear canal and external ear normal. There is no impacted cerumen.     Nose: Nose normal. No congestion or rhinorrhea.     Mouth/Throat:     Mouth: Mucous membranes are moist.     Pharynx: No oropharyngeal exudate or posterior oropharyngeal erythema.  Eyes:     General: No scleral icterus.       Right eye: No discharge.        Left eye: No discharge.     Conjunctiva/sclera: Conjunctivae normal.  Cardiovascular:     Rate and Rhythm: Normal rate and regular rhythm.  Pulmonary:     Effort: Pulmonary effort is normal. No respiratory distress.     Breath sounds: Normal breath sounds. No wheezing.  Musculoskeletal:        General: Normal range of motion.     Cervical back: Normal range of motion and neck supple.  Skin:    General: Skin is warm.  Neurological:     General: No focal deficit present.     Mental Status: She is alert.  Psychiatric:        Mood and Affect: Mood normal.      Assessment & Plan:   See Encounters Tab for problem based charting.  Upper respiratory infection Patient has symptoms of headache, congestion, fever, chills, earaches, coughing, body aches for 3 days.  She denies rhinorrhea, postnasal drip, sore throat or diarrhea.  She denies any sick contacts.  She currently taking Mucinex and cough syrup for her symptoms.  She states that her symptoms are improving.  Patient elected not to have flu shot or COVID shots.  No record of pneumonia shot in chart review.  Physical exam unremarkable.  ENT exam were normal.  Suspect patient had an upper respiratory tract infection.  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  No obvious wheezing heard on my physical exam.  Hopefully this is a limited viral URI but will check for COVID or flu.  In the meantime advised patient to continue Mucinex and cough syrup.  She can take Tylenol for fever or pain.   Patient discussed with Dr. Jimmye Norman

## 2022-09-11 NOTE — Assessment & Plan Note (Addendum)
Patient has symptoms of headache, congestion, fever, chills, earaches, coughing, body aches for 3 days.  She denies rhinorrhea, postnasal drip, sore throat or diarrhea.  She denies any sick contacts.  She currently taking Mucinex and cough syrup for her symptoms.  She states that her symptoms are improving.  Patient elected not to have flu shot or COVID shots.  No record of pneumonia shot in chart review.  Physical exam unremarkable.  ENT exam were normal.  Suspect patient had an upper respiratory tract infection.  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  No obvious wheezing heard on my physical exam.  Hopefully this is a limited viral URI but will check for COVID or flu.  In the meantime advised patient to continue Mucinex and cough syrup.  She can take Tylenol for fever or pain.

## 2022-09-14 ENCOUNTER — Telehealth: Payer: Self-pay

## 2022-09-14 LAB — COVID-19, FLU A+B NAA
Influenza A, NAA: NOT DETECTED
Influenza B, NAA: NOT DETECTED
SARS-CoV-2, NAA: NOT DETECTED

## 2022-09-14 NOTE — Telephone Encounter (Signed)
Requesting lab results, please call pt back.  

## 2022-09-18 NOTE — Progress Notes (Signed)
Internal Medicine Clinic Attending  Case discussed with Dr. Nguyen  At the time of the visit.  We reviewed the resident's history and exam and pertinent patient test results.  I agree with the assessment, diagnosis, and plan of care documented in the resident's note. 

## 2022-09-27 ENCOUNTER — Telehealth: Payer: Self-pay | Admitting: *Deleted

## 2022-09-27 NOTE — Telephone Encounter (Signed)
Call from pt c/o right breast pain and swelling underneath the breast ,rib area. States started Tuesday night ; comes and goes. States it's a sharp,burning pain. Advise pt to go to UC since the office will be close until Wednesday. At first did not want to go to UC, states she will especially if the pain becomes worse. Also an appt has been schedule on 12/27 @ 1345 PM.

## 2022-10-03 ENCOUNTER — Encounter: Payer: Medicare Other | Admitting: Student

## 2022-10-11 ENCOUNTER — Encounter: Payer: Self-pay | Admitting: Student

## 2022-10-11 ENCOUNTER — Other Ambulatory Visit: Payer: Self-pay

## 2022-10-11 ENCOUNTER — Ambulatory Visit (INDEPENDENT_AMBULATORY_CARE_PROVIDER_SITE_OTHER): Payer: Medicare Other | Admitting: Student

## 2022-10-11 VITALS — BP 139/66 | HR 77 | Temp 98.3°F | Ht 64.0 in | Wt 171.5 lb

## 2022-10-11 DIAGNOSIS — R0789 Other chest pain: Secondary | ICD-10-CM

## 2022-10-11 DIAGNOSIS — R1033 Periumbilical pain: Secondary | ICD-10-CM

## 2022-10-11 DIAGNOSIS — E1142 Type 2 diabetes mellitus with diabetic polyneuropathy: Secondary | ICD-10-CM | POA: Diagnosis present

## 2022-10-11 DIAGNOSIS — Z7985 Long-term (current) use of injectable non-insulin antidiabetic drugs: Secondary | ICD-10-CM | POA: Diagnosis not present

## 2022-10-11 DIAGNOSIS — I10 Essential (primary) hypertension: Secondary | ICD-10-CM

## 2022-10-11 DIAGNOSIS — Z7984 Long term (current) use of oral hypoglycemic drugs: Secondary | ICD-10-CM | POA: Diagnosis not present

## 2022-10-11 DIAGNOSIS — R10816 Epigastric abdominal tenderness: Secondary | ICD-10-CM

## 2022-10-11 DIAGNOSIS — E785 Hyperlipidemia, unspecified: Secondary | ICD-10-CM

## 2022-10-11 LAB — POCT GLYCOSYLATED HEMOGLOBIN (HGB A1C): Hemoglobin A1C: 7.9 % — AB (ref 4.0–5.6)

## 2022-10-11 LAB — GLUCOSE, CAPILLARY: Glucose-Capillary: 142 mg/dL — ABNORMAL HIGH (ref 70–99)

## 2022-10-11 MED ORDER — GABAPENTIN 300 MG PO CAPS: 300.0000 mg | ORAL_CAPSULE | Freq: Every day | ORAL | 2 refills | Status: DC

## 2022-10-11 MED ORDER — METFORMIN HCL ER 500 MG PO TB24: 500.0000 mg | ORAL_TABLET | Freq: Two times a day (BID) | ORAL | 3 refills | Status: DC

## 2022-10-11 MED ORDER — ATORVASTATIN CALCIUM 40 MG PO TABS: ORAL_TABLET | ORAL | 3 refills | Status: AC

## 2022-10-11 NOTE — Patient Instructions (Addendum)
Please try gabapentin and lidocaine cream for the pain in your stomach.

## 2022-10-14 LAB — MICROALBUMIN / CREATININE URINE RATIO
Creatinine, Urine: 91.5 mg/dL
Microalb/Creat Ratio: 10 mg/g creat (ref 0–29)
Microalbumin, Urine: 9.1 ug/mL

## 2022-10-15 MED ORDER — LOSARTAN POTASSIUM 50 MG PO TABS: 50.0000 mg | ORAL_TABLET | Freq: Every day | ORAL | 0 refills | Status: DC

## 2022-10-15 NOTE — Assessment & Plan Note (Signed)
A1c is 7.9 this clinic visit which is improved from 9.1 (06/2022). Will continue with metformin and ozempic.

## 2022-10-15 NOTE — Assessment & Plan Note (Addendum)
Patient reports she continues to have pain in her RUQ abdominal area. She has been worked up for this before. A CT chest/a/p in 07/2021 revealed no potential cause for patient's pain. She also had a CXR 10/2021 to look for rib fracture which was unrevealing.   Patient reports her pain has been persistent since starting ozempic injections ~2-3 years ago. She does note that she initially gave herself injections in her RUQ but has since started giving herself injections in her RLQ subq tissue. She denies any other trauma to her RUQ. She does state that the pain in her RUQ sometimes travels to her back. She states that the pain is always present but is worsened with pushing on her RUQ. She was prescribed lyrica but does not appear to have taken this. She states lidocaine patches did not work.   On exam she was noted to have focal tenderness on her R rib cage near her RUQ. No fluid collection was appreciated, though visually possibly slight swelling of R rib cage v L rib cage just lateral to RUQ of abdomen. Overlying skin had no erythema.   A/P: Unclear exact etiology of patient's pain this has been worked up in the past. It is possible that it is neuropathic pain related to her T2DM. She could also have costochondritis. It is also documented that this could be a notalgia paresthetica as well. Pathology of liver or GB is less likely given focal and superficial nature of pain.  -Continue gabapentenoid  -Obtain Ct A/p/chest to r/o structural cause of pain, may require MRI of chest wall to further elucidate soft tissue pathology -Discussed with patient that we could consider another steroid/lidocaine injection to that region to see if this helps with her symptoms.

## 2022-10-15 NOTE — Progress Notes (Signed)
Internal Medicine Clinic Attending   I saw and evaluated the patient.  I personally confirmed the key portions of the history and exam documented by Dr. Carter and I reviewed pertinent patient test results.  The assessment, diagnosis, and plan were formulated together and I agree with the documentation in the resident's note.  

## 2022-10-15 NOTE — Progress Notes (Signed)
   CC: abd pain and chronic medical problems.   HPI:  [name] is a 67 y.o. F with Pmh per below who presents for follow up regarding her abdominal pain. Please see problem based charting for further details.     Past Medical History:  Diagnosis Date   Abdominal pain 03/14/2015   Adenomatous colon polyp 03/2008   Resected on colonoscopy, no high grade dysplasia.    Allergic rhinitis    At risk for polypharmacy 02/27/2018   Bell's palsy 01/2009   Left sided   Chest pain    Exercise stress test negative 6/07   Colon polyps    03/22/2008: adenomatous polyps x3 w no dysplasia. Needs repeat colonoscopy in 3 years   Diabetic peripheral neuropathy (HCC)    External hemorrhoid    GERD (gastroesophageal reflux disease)    Hyperlipidemia    Hypertension    Hypertensive retinopathy    Followed by Dr. Herbert Deaner.   Insomnia    Intolerance, drug    Leg cramps on Lipitor   Left-sided back pain 03/22/2020   Lesion of labia 12/13/2021   Lipoma 09/2010   Posterior neck.    Need for influenza vaccination 07/01/2020   Osteoarthritis    Carpometacarpal joint of right thumb   Osteoporosis screening 02/02/2020   Postmenopausal bleeding 06/2004   Endometrial biopsy showed  FOCAL TUBAL METAPLASIA    Postmenopausal symptoms    Hot flashes, vaginal dryness, iritability, difficulty sleeping. as of 2005.   Seasonal allergies 10/06/2014   Sebaceous cyst of breast    Has been refered to derm.   Tachycardia 07/13/2021   Trigger finger    Right thumb   Type II diabetes mellitus (HCC)    Uterine fibroid    Viral URI with cough 10/11/2017   Vulvovaginitis 02/02/2020   Review of Systems:  Please see problem based charting for further details.   Physical Exam:  Vitals:   10/11/22 1003 10/11/22 1005  BP:  139/66  Pulse:  77  Temp:  98.3 F (36.8 C)  TempSrc:  Oral  SpO2:  100%  Weight: 171 lb 8 oz (77.8 kg)   Height: '5\' 4"'$  (1.626 m)    Constitutional: Well-developed,  well-nourished, and in no distress.  Cardiovascular: Normal rate, regular rhythm, intact distal pulses. No gallop and no friction rub.  No murmur heard. No lower extremity edema  Pulmonary: Non labored breathing on room air, no wheezing or rales  Chest wall: TTP in R rib cage, no overlying erythema of skin, no abscess, no increase warmth of overlying skin  Abdominal: Soft. Normal bowel sounds. Non distended and non tender Musculoskeletal: Normal range of motion.        General: No tenderness or edema.  Neurological: Alert and oriented to person, place, and time. Non focal  Skin: Skin is warm and dry.    Assessment & Plan:   See Encounters Tab for problem based charting.  Patient seen with Dr. Philipp Ovens

## 2022-10-18 ENCOUNTER — Telehealth: Payer: Self-pay

## 2022-10-18 NOTE — Telephone Encounter (Signed)
Patient called she stated she is still having navel pain she would like a call back to discuss further.

## 2022-10-23 ENCOUNTER — Other Ambulatory Visit: Payer: Self-pay | Admitting: Student

## 2022-10-23 DIAGNOSIS — E1142 Type 2 diabetes mellitus with diabetic polyneuropathy: Secondary | ICD-10-CM

## 2022-10-23 MED ORDER — EMPAGLIFLOZIN 10 MG PO TABS: 10.0000 mg | ORAL_TABLET | Freq: Every day | ORAL | 3 refills | Status: DC

## 2022-10-23 NOTE — Progress Notes (Signed)
Discussed with patient that she gets abdominal pain with injecting ozempic.   Stop the ozempic. And then she is going to pick up the [place].    She is going to schedule appointment to follow up with her abdominal pain after getting her ct scans and she is going to call her GYN about her vaginal itching which is likely 2/2 atrophic vaginitis. As she was previously treated for yeast infection and continues to have significant symptoms.

## 2022-10-30 ENCOUNTER — Ambulatory Visit (HOSPITAL_COMMUNITY)
Admission: RE | Admit: 2022-10-30 | Discharge: 2022-10-30 | Disposition: A | Discharge status: Home / Self Care | Payer: Medicare Other | Source: Ambulatory Visit | Attending: Internal Medicine | Admitting: Internal Medicine

## 2022-10-30 DIAGNOSIS — E1142 Type 2 diabetes mellitus with diabetic polyneuropathy: Secondary | ICD-10-CM | POA: Diagnosis present

## 2022-10-30 DIAGNOSIS — I2510 Atherosclerotic heart disease of native coronary artery without angina pectoris: Secondary | ICD-10-CM | POA: Diagnosis not present

## 2022-10-30 DIAGNOSIS — I700 Atherosclerosis of aorta: Secondary | ICD-10-CM | POA: Insufficient documentation

## 2022-10-30 DIAGNOSIS — R0789 Other chest pain: Secondary | ICD-10-CM | POA: Diagnosis present

## 2022-10-30 DIAGNOSIS — J439 Emphysema, unspecified: Secondary | ICD-10-CM | POA: Diagnosis not present

## 2022-10-30 DIAGNOSIS — D259 Leiomyoma of uterus, unspecified: Secondary | ICD-10-CM | POA: Diagnosis not present

## 2022-10-30 MED ORDER — IOHEXOL 350 MG/ML SOLN
75.0000 mL | Freq: Once | INTRAVENOUS | Status: AC | PRN
  Administered 2022-10-30: 75 mL via INTRAVENOUS

## 2022-10-31 LAB — POCT I-STAT CREATININE: Creatinine, Ser: 0.5 mg/dL (ref 0.44–1.00)

## 2022-11-06 ENCOUNTER — Ambulatory Visit: Payer: Medicare Other | Admitting: Podiatry

## 2022-11-12 ENCOUNTER — Ambulatory Visit: Payer: Medicare Other | Admitting: Obstetrics and Gynecology

## 2022-11-20 ENCOUNTER — Ambulatory Visit: Payer: Medicare Other | Admitting: Obstetrics

## 2022-12-05 ENCOUNTER — Ambulatory Visit (INDEPENDENT_AMBULATORY_CARE_PROVIDER_SITE_OTHER): Payer: Medicare Other | Admitting: Podiatry

## 2022-12-05 ENCOUNTER — Other Ambulatory Visit: Payer: Self-pay

## 2022-12-05 ENCOUNTER — Encounter: Payer: Self-pay | Admitting: Podiatry

## 2022-12-05 DIAGNOSIS — B351 Tinea unguium: Secondary | ICD-10-CM | POA: Diagnosis not present

## 2022-12-05 DIAGNOSIS — M79674 Pain in right toe(s): Secondary | ICD-10-CM

## 2022-12-05 DIAGNOSIS — E1142 Type 2 diabetes mellitus with diabetic polyneuropathy: Secondary | ICD-10-CM

## 2022-12-05 DIAGNOSIS — Q828 Other specified congenital malformations of skin: Secondary | ICD-10-CM

## 2022-12-05 DIAGNOSIS — M79675 Pain in left toe(s): Secondary | ICD-10-CM

## 2022-12-05 NOTE — Progress Notes (Signed)
This patient returns to my office for at risk foot care.  This patient requires this care by a professional since this patient will be at risk due to having diabetes.   This patient is unable to cut nails herself since the patient cannot reach her nails.These nails are painful walking and wearing shoes. She has painful forefoot callus   This patient presents for at risk foot care today.  General Appearance  Alert, conversant and in no acute stress.  Vascular  Dorsalis pedis and posterior tibial  pulses are palpable  bilaterally.  Capillary return is within normal limits  bilaterally. Temperature is within normal limits  bilaterally.  Neurologic  Senn-Weinstein monofilament wire test within normal limits  bilaterally. Muscle power within normal limits bilaterally.  Nails Thick disfigured discolored nails with subungual debris  from hallux to fifth toes bilaterally. No evidence of bacterial infection or drainage bilaterally.  Orthopedic  No limitations of motion  feet .  No crepitus or effusions noted.  No bony pathology or digital deformities noted.  Skin  normotropic skin with no porokeratosis noted bilaterally.  No signs of infections or ulcers noted.     Onychomycosis  Pain in right toes  Pain in left toes  Consent was obtained for treatment procedures.   Mechanical debridement of nails 1-5  bilaterally performed with a nail nipper.  Patient had pain associated with dremel tool usage.  Therefore the dremel tool was not used on this patient.  Callus was debrided with # 15 blade only.   Return office visit    3 months                  Told patient to return for periodic foot care and evaluation due to potential at risk complications.   Gardiner Barefoot DPM

## 2022-12-06 ENCOUNTER — Ambulatory Visit (INDEPENDENT_AMBULATORY_CARE_PROVIDER_SITE_OTHER): Payer: Medicare Other

## 2022-12-06 ENCOUNTER — Ambulatory Visit: Payer: Medicare Other | Admitting: *Deleted

## 2022-12-06 VITALS — BP 151/73 | HR 85 | Temp 97.8°F | Wt 170.1 lb

## 2022-12-06 VITALS — BP 151/73 | HR 85 | Temp 97.8°F | Wt 170.2 lb

## 2022-12-06 DIAGNOSIS — Z0000 Encounter for general adult medical examination without abnormal findings: Secondary | ICD-10-CM | POA: Diagnosis not present

## 2022-12-06 DIAGNOSIS — F17210 Nicotine dependence, cigarettes, uncomplicated: Secondary | ICD-10-CM

## 2022-12-06 DIAGNOSIS — M5414 Radiculopathy, thoracic region: Secondary | ICD-10-CM

## 2022-12-06 DIAGNOSIS — B9689 Other specified bacterial agents as the cause of diseases classified elsewhere: Secondary | ICD-10-CM

## 2022-12-06 DIAGNOSIS — J329 Chronic sinusitis, unspecified: Secondary | ICD-10-CM | POA: Diagnosis present

## 2022-12-06 MED ORDER — GABAPENTIN 300 MG PO CAPS: 300.0000 mg | ORAL_CAPSULE | Freq: Three times a day (TID) | ORAL | 2 refills | Status: DC

## 2022-12-06 MED ORDER — AMOXICILLIN-POT CLAVULANATE 875-125 MG PO TABS: 1.0000 | ORAL_TABLET | Freq: Two times a day (BID) | ORAL | 0 refills | Status: AC

## 2022-12-06 NOTE — Progress Notes (Addendum)
Subjective:   Bridget Owens is a 67 y.o. female who presents for an Initial Medicare Annual Wellness Visit.  Review of Systems    Defer to pcp       Objective:    Today's Vitals   12/06/22 1409 12/06/22 1604  BP: (!) 151/73   Pulse: 85   Temp: 97.8 F (36.6 C)   TempSrc: Oral   SpO2: 100%   Weight: 170 lb 3.1 oz (77.2 kg)   PainSc:  8    Body mass index is 29.21 kg/m.     12/06/2022    2:10 PM 10/11/2022   10:08 AM 09/11/2022    2:54 PM 08/01/2022   10:55 AM 02/28/2022   10:35 AM 12/28/2021    3:59 PM 12/12/2021    9:15 AM  Advanced Directives  Does Patient Have a Medical Advance Directive? No No No No No No No  Would patient like information on creating a medical advance directive? No - Patient declined No - Patient declined No - Patient declined No - Patient declined No - Patient declined No - Patient declined No - Patient declined    Current Medications (verified) Outpatient Encounter Medications as of 12/06/2022  Medication Sig   Accu-Chek FastClix Lancets MISC Check up to two times a day.   amLODipine (NORVASC) 5 MG tablet Take 1 tablet (5 mg total) by mouth daily.   amoxicillin-clavulanate (AUGMENTIN) 875-125 MG tablet Take 1 tablet by mouth 2 (two) times daily for 5 days.   aspirin 81 MG EC tablet Take 81 mg by mouth daily.   (Patient not taking: Reported on 06/19/2022)   atorvastatin (LIPITOR) 40 MG tablet TAKE 1 TABLET(40 MG) BY MOUTH DAILY   BISACODYL 5 MG EC tablet Take by mouth as directed.   Blood Glucose Monitoring Suppl (ACCU-CHEK GUIDE) w/Device KIT USE AS DIRECTED   cetirizine HCl (ZYRTEC) 5 MG/5ML SOLN Take 5 mLs (5 mg total) by mouth daily.   clotrimazole (LOTRIMIN) 1 % cream Apply 1 application. topically 2 (two) times daily. To area under the breasts   diclofenac Sodium (VOLTAREN) 1 % GEL Apply 2 g topically daily as needed. (Patient not taking: Reported on 06/19/2022)   empagliflozin (JARDIANCE) 10 MG TABS tablet Take 1 tablet (10 mg total) by mouth  daily before breakfast.   gabapentin (NEURONTIN) 300 MG capsule Take 1 capsule (300 mg total) by mouth 3 (three) times daily.   glucose blood (ACCU-CHEK GUIDE) test strip CHECK BLOOD SUGAR UP TO TWICE DAILY   hydrochlorothiazide (HYDRODIURIL) 25 MG tablet Take 1 tablet (25 mg total) by mouth daily.   lidocaine (LIDODERM) 5 % Place 1 patch onto the skin daily. Remove & Discard patch within 12 hours or as directed by MD   losartan (COZAAR) 50 MG tablet Take 1 tablet (50 mg total) by mouth daily.   metFORMIN (GLUCOPHAGE-XR) 500 MG 24 hr tablet Take 1 tablet (500 mg total) by mouth 2 (two) times daily with a meal.   nystatin-triamcinolone ointment (MYCOLOG) Apply 1 Application topically 2 (two) times daily.   Semaglutide,0.25 or 0.'5MG'$ /DOS, (OZEMPIC, 0.25 OR 0.5 MG/DOSE,) 2 MG/3ML SOPN INJECT 0.'5MG'$  SUBCUTANEOUS ONCE A WEEK   Sodium Chloride-Sodium Bicarb (NETI POT SINUS WASH) 2300-700 MG KIT Use to wash out sinuses twice daily as needed   No facility-administered encounter medications on file as of 12/06/2022.    Allergies (verified) Codeine   History: Past Medical History:  Diagnosis Date   Abdominal pain 03/14/2015   Adenomatous colon polyp  03/2008   Resected on colonoscopy, no high grade dysplasia.    Allergic rhinitis    At risk for polypharmacy 02/27/2018   Bell's palsy 01/2009   Left sided   Chest pain    Exercise stress test negative 6/07   Colon polyps    03/22/2008: adenomatous polyps x3 w no dysplasia. Needs repeat colonoscopy in 3 years   Diabetic peripheral neuropathy (HCC)    External hemorrhoid    GERD (gastroesophageal reflux disease)    Hyperlipidemia    Hypertension    Hypertensive retinopathy    Followed by Dr. Herbert Deaner.   Insomnia    Intolerance, drug    Leg cramps on Lipitor   Left-sided back pain 03/22/2020   Lesion of labia 12/13/2021   Lipoma 09/2010   Posterior neck.    Need for influenza vaccination 07/01/2020   Osteoarthritis    Carpometacarpal joint of  right thumb   Osteoporosis screening 02/02/2020   Postmenopausal bleeding 06/2004   Endometrial biopsy showed  FOCAL TUBAL METAPLASIA    Postmenopausal symptoms    Hot flashes, vaginal dryness, iritability, difficulty sleeping. as of 2005.   Seasonal allergies 10/06/2014   Sebaceous cyst of breast    Has been refered to derm.   Tachycardia 07/13/2021   Trigger finger    Right thumb   Type II diabetes mellitus (HCC)    Uterine fibroid    Viral URI with cough 10/11/2017   Vulvovaginitis 02/02/2020   Past Surgical History:  Procedure Laterality Date   TUBAL LIGATION     Family History  Problem Relation Age of Onset   Pneumonia Mother    Diabetes Mother    Early death Father    Diabetes Sister    Diabetes Brother    Diabetes Maternal Grandmother    Social History   Socioeconomic History   Marital status: Single    Spouse name: Not on file   Number of children: Not on file   Years of education: Not on file   Highest education level: Not on file  Occupational History   Not on file  Tobacco Use   Smoking status: Every Day    Packs/day: 0.50    Years: 47.00    Total pack years: 23.50    Types: Cigarettes   Smokeless tobacco: Former    Quit date: 11/28/2010   Tobacco comments:    5-6 cigs per day  Vaping Use   Vaping Use: Never used  Substance and Sexual Activity   Alcohol use: No    Alcohol/week: 0.0 standard drinks of alcohol   Drug use: No   Sexual activity: Not on file  Other Topics Concern   Not on file  Social History Narrative   Not on file   Social Determinants of Health   Financial Resource Strain: Low Risk  (09/11/2022)   Overall Financial Resource Strain (CARDIA)    Difficulty of Paying Living Expenses: Not hard at all  Food Insecurity: Food Insecurity Present (12/06/2022)   Hunger Vital Sign    Worried About Running Out of Food in the Last Year: Sometimes true    Ran Out of Food in the Last Year: Sometimes true  Transportation Needs: Unmet  Transportation Needs (12/06/2022)   PRAPARE - Hydrologist (Medical): Yes    Lack of Transportation (Non-Medical): No  Physical Activity: Insufficiently Active (09/11/2022)   Exercise Vital Sign    Days of Exercise per Week: 7 days    Minutes of  Exercise per Session: 10 min  Stress: No Stress Concern Present (09/11/2022)   [place]    Feeling of Stress : Not at all  Social Connections: Socially Isolated (12/06/2022)   Social Connection and Isolation Panel [NHANES]    Frequency of Communication with Friends and Family: More than three times a week    Frequency of Social Gatherings with Friends and Family: More than three times a week    Attends Religious Services: Never    Marine scientist or Organizations: No    Attends Music therapist: Never    Marital Status: Never married    Tobacco Counseling Ready to quit: Yes Counseling given: Yes Tobacco comments: 5-6 cigs per day   Clinical Intake:  Pre-visit preparation completed: Yes  Pain : 0-10 Pain Score: 8  Pain Type: Chronic pain Pain Location: Flank Pain Orientation: Right Pain Descriptors / Indicators: Aching Pain Onset: More than a month ago Pain Frequency: Intermittent Pain Relieving Factors: NONE Effect of Pain on Daily Activities: cant do my housework  Pain Relieving Factors: NONE  Nutritional Status: BMI 25 -29 Overweight Nutritional Risks: None Diabetes: Yes CBG done?: No Did pt. bring in CBG monitor from home?: No  How often do you need to have someone help you when you read instructions, pamphlets, or other written materials from your doctor or pharmacy?: 1 - Never What is the last grade level you completed in school?: 11th grade  Nutrition Risk Assessment:  Has the patient had any N/V/D within the last 2 months?  No  Does the patient have any non-healing wounds?  No  Has the patient had any  unintentional weight loss or weight gain?  No   Diabetes:  Is the patient diabetic?  Yes  If diabetic, was a CBG obtained today?  No  Did the patient bring in their glucometer from home?   N/a How often do you monitor your CBG's? Does not test    Financial Strains and Diabetes Management:  Are you having any financial strains with the device, your supplies or your medication? No .  Does the patient want to be seen by Chronic Care Management for management of their diabetes?  No  Would the patient like to be referred to a Nutritionist or for Diabetic Management?  No   Diabetic Exams:  Diabetic Eye Exam: Completed   Diabetic Foot Exam: Completed 12/05/2022            Activities of Daily Living    12/06/2022    4:05 PM 12/06/2022    2:12 PM  In your present state of health, do you have any difficulty performing the following activities:  Hearing? 0 0  Vision? 0 0  Difficulty concentrating or making decisions? 0 0  Walking or climbing stairs? 0 0  Dressing or bathing? 0 0  Doing errands, shopping? 0 0    Patient Care Team: Masters, Joellen Jersey, DO as PCP - General Monna Fam, MD (Ophthalmology)  Indicate any recent Medical Services you may have received from other than [facility] providers in the past year (date may be approximate).     Assessment:   This is a routine wellness examination for Chloelynn.  Hearing/Vision screen No results found.  Dietary issues and exercise activities discussed:     Goals Addressed   None   Depression Screen    12/06/2022    4:08 PM 12/06/2022    2:12 PM 10/11/2022   10:09  AM 09/11/2022    2:53 PM 08/01/2022   10:55 AM 04/12/2022    3:12 PM 02/28/2022   10:34 AM  PHQ 2/9 Scores  PHQ - 2 Score 0 0 1 0 0 0 0  PHQ- 9 Score    0 4      Fall Risk    12/06/2022    4:05 PM 12/06/2022    2:11 PM 10/11/2022   12:30 PM 09/11/2022    2:51 PM 08/01/2022   10:55 AM  Fall Risk   Falls in the past year? 0 0 0 0 0  Number falls in past yr: 0 0 0 0    Injury with Fall? 0 0  0   Risk for fall due to : No Fall Risks  No Fall Risks No Fall Risks No Fall Risks  Follow up Falls evaluation completed Falls evaluation completed Falls evaluation completed Falls evaluation completed;Falls prevention discussed     FALL RISK PREVENTION PERTAINING TO THE HOME:  Any stairs in or around the home? Yes  If so, are there any without handrails? Yes  Home free of loose throw rugs in walkways, pet beds, electrical cords, etc? No  Adequate lighting in your home to reduce risk of falls? Yes   ASSISTIVE DEVICES UTILIZED TO PREVENT FALLS:  Life alert? No  Use of a cane, walker or w/c? No  Grab bars in the bathroom? No  Shower chair or bench in shower? No  Elevated toilet seat or a handicapped toilet? No   TIMED UP AND GO:  Was the test performed? No .  Length of time to ambulate 10 feet: 0 sec.   Gait steady and fast without use of assistive device  Cognitive Function:        12/06/2022    4:06 PM  6CIT Screen  What Year? 0 points  What month? 0 points  What time? 0 points  Count back from 20 2 points  Months in reverse 2 points  Repeat phrase 2 points  Total Score 6 points    Immunizations Immunization History  Administered Date(s) Administered   Td 05/18/2009    TDAP status: Due, Education has been provided regarding the importance of this vaccine. Advised may receive this vaccine at local pharmacy or Health Dept. Aware to provide a copy of the vaccination record if obtained from local pharmacy or Health Dept. Verbalized acceptance and understanding.  Flu Vaccine status: Declined, Education has been provided regarding the importance of this vaccine but patient still declined. Advised may receive this vaccine at local pharmacy or Health Dept. Aware to provide a copy of the vaccination record if obtained from local pharmacy or Health Dept. Verbalized acceptance and understanding.  Pneumococcal vaccine status: Declined,  Education has  been provided regarding the importance of this vaccine but patient still declined. Advised may receive this vaccine at local pharmacy or Health Dept. Aware to provide a copy of the vaccination record if obtained from local pharmacy or Health Dept. Verbalized acceptance and understanding.   Covid-19 vaccine status: Declined, Education has been provided regarding the importance of this vaccine but patient still declined. Advised may receive this vaccine at local pharmacy or Health Dept.or vaccine clinic. Aware to provide a copy of the vaccination record if obtained from local pharmacy or Health Dept. Verbalized acceptance and understanding.  Qualifies for Shingles Vaccine? Yes   Zostavax completed No   Shingrix Completed?: No.    Education has been provided regarding the importance of this vaccine.  Patient has been advised to call insurance company to determine out of pocket expense if they have not yet received this vaccine. Advised may also receive vaccine at local pharmacy or Health Dept. Verbalized acceptance and understanding.  Screening Tests Health Maintenance  Topic Date Due   COVID-19 Vaccine (1) 12/22/2022 (Originally 07/11/1956)   INFLUENZA VACCINE  01/06/2023 (Originally 05/08/2022)   Zoster Vaccines- Shingrix (1 of 2) 03/06/2023 (Originally 01/10/2004)   Pneumonia Vaccine 64+ Years old (1 of 2 - PCV) 12/06/2023 (Originally 01/10/1962)   COLONOSCOPY (Pts 45-66yr Insurance coverage will need to be confirmed)  12/06/2023 (Originally 08/28/2022)   HEMOGLOBIN A1C  01/10/2023   OPHTHALMOLOGY EXAM  02/20/2023   Diabetic kidney evaluation - eGFR measurement  08/02/2023   LIPID PANEL  08/02/2023   Diabetic kidney evaluation - Urine ACR  10/12/2023   Lung Cancer Screening  10/31/2023   FOOT EXAM  12/06/2023   Medicare Annual Wellness (AWV)  12/06/2023   MAMMOGRAM  01/31/2024   DEXA SCAN  Completed   Hepatitis C Screening  Completed   HPV VACCINES  Aged Out   DTaP/Tdap/Td  Discontinued     Health Maintenance  There are no preventive care reminders to display for this patient.   Colorectal cancer screening: Type of screening: Colonoscopy. Completed 08/28/2012. Repeat every 10 years pt overdue but declines appt at this time   Mammogram status: Completed 01/30/2022. Repeat every year  Lung Cancer Screening: (Low Dose CT Chest recommended if Age 69-80years, 30 pack-year currently smoking OR have quit w/in 15years.) does qualify.   Lung Cancer Screening Referral: defer to pcp  Additional Screening:  Hepatitis C Screening: does not qualify; Completed 07/27/2019  Vision Screening: Recommended annual ophthalmology exams for early detection of glaucoma and other disorders of the eye. Is the patient up to date with their annual eye exam?  Yes  Who is the provider or what is the name of the office in which the patient attends annual eye exams? HJanett Billowshe was seen around Jan 2024 If pt is not established with a provider, would they like to be referred to a provider to establish care? No .   Dental Screening: Recommended annual dental exams for proper oral hygiene  Community Resource Referral / Chronic Care Management: CRR required this visit?  No   CCM required this visit?  No      Plan:     I have personally reviewed and noted the following in the patient's chart:   Medical and social history Use of alcohol, tobacco or illicit drugs  Current medications and supplements including opioid prescriptions. Patient is not currently taking opioid prescriptions. Functional ability and status Nutritional status Physical activity Advanced directives List of other physicians Hospitalizations, surgeries, and ER visits in previous 12 months Vitals Screenings to include cognitive, depression, and falls Referrals and appointments  In addition, I have reviewed and discussed with patient certain preventive protocols, quality metrics, and best practice  recommendations. A written personalized care plan for preventive services as well as general preventive health recommendations were provided to patient.     GDespina Hidden[place]race [place]  12/06/2022   Nurse Notes: face to face  Ms. JRonnald Ramp, Thank you for taking time to come for your Medicare Wellness Visit. I appreciate your ongoing commitment to your health goals. Please review the following plan we discussed and let me know if I can assist you in the future.   These are the goals we discussed:  Goals  Blood Pressure < 140/90     HEMOGLOBIN A1C < 7.0     LDL CALC < 100        This is a list of the screening recommended for you and due dates:  Health Maintenance  Topic Date Due   COVID-19 Vaccine (1) 12/22/2022*   Flu Shot  01/06/2023*   Zoster (Shingles) Vaccine (1 of 2) 03/06/2023*   Pneumonia Vaccine (1 of 2 - PCV) 12/06/2023*   Colon Cancer Screening  12/06/2023*   Hemoglobin A1C  01/10/2023   Eye exam for diabetics  02/20/2023   Yearly kidney function blood test for diabetes  08/02/2023   Lipid (cholesterol) test  08/02/2023   Yearly kidney health urinalysis for diabetes  10/12/2023   Screening for Lung Cancer  10/31/2023   Complete foot exam   12/06/2023   Medicare Annual Wellness Visit  12/06/2023   Mammogram  01/31/2024   DEXA scan (bone density measurement)  Completed   Hepatitis C Screening: USPSTF Recommendation to screen - Ages 24-79 yo.  Completed   HPV Vaccine  Aged Out   DTaP/Tdap/Td vaccine  Discontinued  *Topic was postponed. The date shown is not the original due date.

## 2022-12-06 NOTE — Patient Instructions (Signed)

## 2022-12-06 NOTE — Progress Notes (Deleted)
Aortic atherosclerosis HLD Patient reports adherence with atorvastatin. Repeat lipid panel showed LDL at goal for primary prevention <100.   Atorva 40  HTN Check BMP with addition of losartan BMP showed creatinine and potassium within normal limits. Continue amlodipine 5 mg, losartan 50 mg, and hydrochlorothiazide 25 mg  T2DM c/b peripheral neuropathy A1c is 7.9 this clinic visit which is improved from 9.1 (06/2022). Will continue with metformin, jardiance '10mg'$ . Ozempic stopped.   Onychomycosis Podiatry for debridement  HCM: Colonoscopy Shingles Pneumonia vaccine   Patient reports she continues to have pain in her RUQ abdominal area. She has been worked up for this before. A CT chest/a/p in 07/2021 revealed no potential cause for patient's pain. She also had a CXR 10/2021 to look for rib fracture which was unrevealing.    Patient reports her pain has been persistent since starting ozempic injections ~2-3 years ago. She does note that she initially gave herself injections in her RUQ but has since started giving herself injections in her RLQ subq tissue. She denies any other trauma to her RUQ. She does state that the pain in her RUQ sometimes travels to her back. She states that the pain is always present but is worsened with pushing on her RUQ. She was prescribed lyrica but does not appear to have taken this. She states lidocaine patches did not work.    On exam she was noted to have focal tenderness on her R rib cage near her RUQ. No fluid collection was appreciated, though visually possibly slight swelling of R rib cage v L rib cage just lateral to RUQ of abdomen. Overlying skin had no erythema.    A/P: Unclear exact etiology of patient's pain this has been worked up in the past. It is possible that it is neuropathic pain related to her T2DM. She could also have costochondritis. It is also documented that this could be a notalgia paresthetica as well. Pathology of liver or GB is less  likely given focal and superficial nature of pain.  -Continue gabapentenoid  -Obtain Ct A/p/chest to r/o structural cause of pain, may require MRI of chest wall to further elucidate soft tissue pathology -Discussed with patient that we could consider another steroid/lidocaine injection to that region to see if this helps with her symptoms.       Patient experienced fall in [place] where she landed on her back approximately three months ago. Since the fall, she reports ongoing waxing and waning burning pain and itchiness of the left upper back area. She has tried using topical lidocaine and gabapentin without resolution of her symptoms. She has received an MRI of her thoracic spine which was insiginifacnt for explaining her symptoms, however, lateral disc bulging was present at the T3-T4 and T4-T5 discs. Physical examination reveals a hyperpigmented patch over the left mid-upper back.   Assessment: Her symptoms of pruritus, tenderness, burning pain and hyperalgesia in a unilateral distribution of her mid-upper back is consistent with Notalgia Paresthetica. This sensory neuropathy is likely secondary to her fall and mild disease of her spine. Multiple options are available for treatment of this condition including: -topical cooling lotions or creams such as camphor and menthol -topical steroids to treat associated lichen simplex -Capsaicin cream -Local anesthetic creams -Amitriptyline -Gabapentin or pregabalin -Transcutaneous electrical nerve stimulation (TENS) -Surgical decompression of vertebral nerve impingement -Physical therapy with repetitive exercises and stretches for the upper back   Plan: Discussed options for treatment with patient, she is interested in starting physical therapy with TENS,  gabapentin and camphor-menthol.  Stopped ozempic given abdominal pain, started farxiga. CT scan? 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  Labs:  A1c 7.9 10/2022 Microalbumin 10 10/2022 LDL 99 07/2022 BMP wnl 07/2022

## 2022-12-06 NOTE — Patient Instructions (Signed)
Thank you, [name] for allowing [place] to provide your care today. Today we discussed :  Bacterial sinus infection: Please take augmentin every 12 hours(twice daily) for 5 days.  R sided pain: We will increase gabapentin to '300mg'$  three times daily.    I have ordered the following medication/changed the following medications:   Stop the following medications: Medications Discontinued During This Encounter  Medication Reason   gabapentin (NEURONTIN) 300 MG capsule      Start the following medications: Meds ordered this encounter  Medications   amoxicillin-clavulanate (AUGMENTIN) 875-125 MG tablet    Sig: Take 1 tablet by mouth 2 (two) times daily for 5 days.    Dispense:  10 tablet    Refill:  0   gabapentin (NEURONTIN) 300 MG capsule    Sig: Take 1 capsule (300 mg total) by mouth 3 (three) times daily.    Dispense:  90 capsule    Refill:  2     Follow up: 2 months     We look forward to seeing you next time. Please call our clinic at [phone] if you have any questions or concerns. The best time to call is Monday-Friday from 9am-4pm, but there is someone available 24/7. If after hours or the weekend, call the main hospital number and ask for the Internal Medicine Resident On-Call. If you need medication refills, please notify your pharmacy one week in advance and they will send [place] a request.   Thank you for trusting me with your care. Wishing you the best!   Iona Coach, MD [place]

## 2022-12-07 DIAGNOSIS — M5414 Radiculopathy, thoracic region: Secondary | ICD-10-CM | POA: Insufficient documentation

## 2022-12-07 NOTE — Assessment & Plan Note (Signed)
Patient with prior history of bacterial sinusitis presents with 3 weeks of nasal congestion, yell/green drainage, bilateral ear pain, cough that she associates with post nasal drip, chills. Denies fever, sinus pain, headaches, muscle aches, SOB,CP. Symptoms have not improved at all during this time. Has tried mucinex and coricidin.  Patient is hds and afebrile.On exam appears well with normal wob, lctab. There is nasal congestion and very erythematous turbinates. No ptp of frontal or maxillary sinuses. Bilateral tympanic membranes non erythematous without fluid or drainage. Oropharynx with some petechial redness and with cobble stoning. No cervical lymphadenopathy. Will treat as bacterial sinusitis. -Augmentin 875-125 q12hrs x 5 days

## 2022-12-07 NOTE — Assessment & Plan Note (Signed)
Patient with long standing history of mid back neuropathic pain dating back to 2021 following fall at [facility] [place]. At that time MRI demonstrated Mild foraminal narrowing bilaterally at T10-11 and T11-12 Leftward disc protrusion and annular tear at T11-12 with mild left foraminal narrowing. 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 Patient presenting now with sharp burning pains and accompanying pruritus over the anterolateral R 11-12th ribs. There is some ptp over this area as well but no mass, bony deformity or rash. She says starting in 2022 she had L axillary pain that was similar, which eventually resolved and moved to the epigastric region, and now the R thoracic region. She attributes this to her ozempic. I do not think these pains are connected. She says her gabapentin '300mg'$  nightly helps. I do not think this is an isolated diabetes intercostal neuralgia. 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 On prior eval in 2021 it was noted that she had a hyperpigmented patch on her back in the distribution of the pain. I am curious if this was the latter end of resolving shingles. We will increase her gabapentin at this time. Can consider pregabalin if no improvement. Capsaicin cream could be considered. If no resolution given worsening symptoms may be beneficial to repeat T spine MRI. -increase gabapentin to '300mg'$  TID

## 2022-12-07 NOTE — Progress Notes (Signed)
Established Patient Office Visit  Subjective   Patient ID: Bridget Owens, female    DOB: Feb 26, 1956  Age: 67 y.o. MRN: [id number]  Chief Complaint  Patient presents with   Sinusitis   Cough   Otalgia   Medication Refill   Follow-up   Flank Pain    Bridget Owens is a 67 year old female with a pmh outlined below. Please see A&P for HPI information.      Review of Systems  All other systems reviewed and are negative.     Objective:     BP (!) 151/73 (BP Location: Right Arm, Patient Position: Sitting, Cuff Size: Small)   Pulse 85   Temp 97.8 F (36.6 C) (Oral)   Wt 170 lb 1.6 oz (77.2 kg)   SpO2 100%   BMI 29.20 kg/m    Physical Exam HENT:     Head: Normocephalic and atraumatic.     Right Ear: Tympanic membrane, ear canal and external ear normal. There is no impacted cerumen.     Left Ear: Tympanic membrane, ear canal and external ear normal. There is no impacted cerumen.     Nose:     Comments: There is nasal congestion and very erythematous turbinates. No ptp of frontal or maxillary sinuses.     Mouth/Throat:     Pharynx: No oropharyngeal exudate.     Comments: Oropharynx with some petechial redness and with cobble stoning.  Eyes:     General: No scleral icterus.       Right eye: No discharge.        Left eye: No discharge.     Conjunctiva/sclera: Conjunctivae normal.     Pupils: Pupils are equal, round, and reactive to light.  Cardiovascular:     Rate and Rhythm: Normal rate and regular rhythm.     Pulses: Normal pulses.     Heart sounds: Normal heart sounds. No murmur heard.    No gallop.  Pulmonary:     Effort: Pulmonary effort is normal. No respiratory distress.     Breath sounds: Normal breath sounds. No wheezing or rales.  Musculoskeletal:     Cervical back: Normal range of motion and neck supple.     Right lower leg: No edema.     Left lower leg: No edema.     Comments:  R 11-12th ribs with some ptp over the area but no mass, bony deformity or  rash.  Lymphadenopathy:     Cervical: No cervical adenopathy.  Skin:    General: Skin is warm and dry.     Capillary Refill: Capillary refill takes less than 2 seconds.      No results found for any visits on 12/06/22.    The 10-year ASCVD risk score (Arnett DK, et al., 2019) is: 47.9%    Assessment & Plan:   Problem List Items Addressed This Visit       Respiratory   Bacterial sinusitis    Patient with prior history of bacterial sinusitis presents with 3 weeks of nasal congestion, yell/green drainage, bilateral ear pain, cough that she associates with post nasal drip, chills. Denies fever, sinus pain, headaches, muscle aches, SOB,CP. Symptoms have not improved at all during this time. Has tried mucinex and coricidin.  Patient is hds and afebrile.On exam appears well with normal wob, lctab. There is nasal congestion and very erythematous turbinates. No ptp of frontal or maxillary sinuses. Bilateral tympanic membranes non erythematous without fluid or drainage. Oropharynx with some petechial  redness and with cobble stoning. No cervical lymphadenopathy. Will treat as bacterial sinusitis. -Augmentin 875-125 q12hrs x 5 days      Relevant Medications   amoxicillin-clavulanate (AUGMENTIN) 875-125 MG tablet     Nervous and Auditory   Neuropathy, thoracic (radicular)    Patient with long standing history of mid back neuropathic pain dating back to 2021 following fall at [place]. At that time MRI demonstrated Mild foraminal narrowing bilaterally at T10-11 and T11-12 Leftward disc protrusion and annular tear at T11-12 with mild left foraminal narrowing. 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 Patient presenting now with sharp burning pains and accompanying pruritus over the anterolateral R 11-12th ribs. There is some ptp over this area as well  but no mass, bony deformity or rash. She says starting in 2022 she had L axillary pain that was similar, which eventually resolved and moved to the epigastric region, and now the R thoracic region. She attributes this to her ozempic. I do not think these pains are connected. She says her gabapentin '300mg'$  nightly helps. I do not think this is an isolated diabetes intercostal neuralgia. 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 On prior eval in 2021 it was noted that she had a hyperpigmented patch on her back in the distribution of the pain. I am curious if this was the latter end of resolving shingles. We will increase her gabapentin at this time. Can consider pregabalin if no improvement. Capsaicin cream could be considered. If no resolution given worsening symptoms may be beneficial to repeat T spine MRI. -increase gabapentin to '300mg'$  TID      Relevant Medications   gabapentin (NEURONTIN) 300 MG capsule   Other Visit Diagnoses     Acute bacterial rhinosinusitis    -  Primary   Relevant Medications   amoxicillin-clavulanate (AUGMENTIN) 875-125 MG tablet       Return in about 2 months (around 02/04/2023).    Iona Coach, MD

## 2022-12-08 ENCOUNTER — Other Ambulatory Visit: Payer: Self-pay | Admitting: Internal Medicine

## 2022-12-08 DIAGNOSIS — E1142 Type 2 diabetes mellitus with diabetic polyneuropathy: Secondary | ICD-10-CM

## 2022-12-10 ENCOUNTER — Telehealth: Payer: Self-pay | Admitting: *Deleted

## 2022-12-10 NOTE — Progress Notes (Signed)
Internal Medicine Clinic Attending  Case discussed with the resident at the time of the visit.  We reviewed the resident's history and exam and pertinent patient test results.  I agree with the assessment, diagnosis, and plan of care documented in the resident's note.  

## 2022-12-10 NOTE — Telephone Encounter (Signed)
Patient called in requesting diflucan 3 tabs be sent to [facility] on [address]. States she always gets vaginal yeast infection when she takes antibiotics. C/o vaginal itching and white vaginal discharge.

## 2022-12-14 ENCOUNTER — Other Ambulatory Visit: Payer: Self-pay | Admitting: Internal Medicine

## 2022-12-14 DIAGNOSIS — I10 Essential (primary) hypertension: Secondary | ICD-10-CM

## 2022-12-19 ENCOUNTER — Telehealth: Payer: Self-pay

## 2022-12-19 NOTE — Telephone Encounter (Signed)
Pt is requesting a call back ... She stated that  her rash is now on her arms ( both )  and there are very itchy  she has an appt with [facility] on 3/20 but she stated she needs help now ... We do and an opening with Dr Jeoffrey Massed  3/:45

## 2022-12-19 NOTE — Telephone Encounter (Signed)
Returned call to patient. States rash on arms is very itchy. Rash also on back, but less bothersome than arms. She feels rash began after starting Jardiance. Rash on arms worsens in sunlight. States it looks like "heat rash." She has been using dial soap and applying nystatin ointment with only short term relief. Patient was offered appt today at 1545 but was unable to take this. She has first available appt on 12/26/22 at 1345 with [facility]. She is wanting to know if she should stop taking Jardiance.  Still c/o vaginal itching and burning with white d/c. Using OTC cream with only short term relief. Please see phone note from 3/4.

## 2022-12-19 NOTE — Telephone Encounter (Signed)
Patient notified to stop taking Jardiance until she is seen in clinic next week. She agrees.

## 2022-12-23 NOTE — Progress Notes (Signed)
CC: Generalized Itching  HPI:   [name] is a 67 y.o. female with a past medical history of obesity, hypertension, hyperlipidemia, diabetes II, and osteoarthritis who presents for concerns about possible medication reaction. She was last seen at [facility] on 2-29.     Past Medical History:  Diagnosis Date   Abdominal pain 03/14/2015   Adenomatous colon polyp 03/2008   Resected on colonoscopy, no high grade dysplasia.    Allergic rhinitis    At risk for polypharmacy 02/27/2018   Bell's palsy 01/2009   Left sided   Chest pain    Exercise stress test negative 6/07   Colon polyps    03/22/2008: adenomatous polyps x3 w no dysplasia. Needs repeat colonoscopy in 3 years   Diabetic peripheral neuropathy (HCC)    External hemorrhoid    GERD (gastroesophageal reflux disease)    Hyperlipidemia    Hypertension    Hypertensive retinopathy    Followed by Dr. Herbert Deaner.   Insomnia    Intolerance, drug    Leg cramps on Lipitor   Left-sided back pain 03/22/2020   Lesion of labia 12/13/2021   Lipoma 09/2010   Posterior neck.    Need for influenza vaccination 07/01/2020   Osteoarthritis    Carpometacarpal joint of right thumb   Osteoporosis screening 02/02/2020   Postmenopausal bleeding 06/2004   Endometrial biopsy showed  FOCAL TUBAL METAPLASIA    Postmenopausal symptoms    Hot flashes, vaginal dryness, iritability, difficulty sleeping. as of 2005.   Seasonal allergies 10/06/2014   Sebaceous cyst of breast    Has been refered to derm.   Tachycardia 07/13/2021   Trigger finger    Right thumb   Type II diabetes mellitus (HCC)    Uterine fibroid    Viral URI with cough 10/11/2017   Vulvovaginitis 02/02/2020     Review of Systems:    Reports generalized pruritus Denies abdominal pain, bloating, nausea, diarrhea, cough, breath shortness, leg swelling    Physical Exam:  Vitals:   12/26/22 1333 12/26/22 1341  BP: (!) 155/71 138/65  Pulse: 80 85  Resp: (!) 24   Temp: 98.2  F (36.8 C)   TempSrc: Oral   SpO2: 100%   Weight: 168 lb 3.2 oz (76.3 kg)   Height: 5\' 6"  (1.676 m)     General:   awake and alert, sitting comfortably in chair, cooperative, not in acute distress Skin:   patches of erythema involving back, facial folds, and bilateral upper extremities especially distal anterior forearm surfaces Lungs:   normal respiratory effort, breathing unlabored, symmetrical chest rise Cardiac:   regular rate and rhythm, normal S1 and S2, no pitting edema Neurologic:   oriented to person-place-time, moving all extremities, sensation to light touch intact, no gross focal deficits Psychiatric:   euthymic mood with congruent affect, intelligible speech    Assessment & Plan:   Generalized pruritus Patient developed an itchy rash that started sometime within the last three months. She is unsure exactly when the itching started. Involves her upper extremities, thighs, back, and lower face. Alleviated slightly by warm showers, cortisone cream has proven ineffective in providing any relief. Denies recent changes in bath soap, laundry detergent, fabric softener, skin lotions, and diet. History notable for two recent medication changes. On 1-16, she was prescribed empagliflozin and then five-day course of Augmentin started 2-29 for bacterial sinusitis. Both of these dates are within the symptom onset timeline provided by patient. Empagliflozin was discontinued on 3-13, after which patient  noticed modest improvement in her itching. Further denies abdominal pain, bloating, nausea, diarrhea, cough, breath shortness, and leg swelling. Exam notable for patches of erythema involving back, facial folds, and bilateral upper extremities especially distal anterior forearm surfaces. Etiology remains unclear, most likely adverse drug reaction to either empagliflozin or Augmentin. Diagnostic workup warranted to rule out liver, biliary, thyroid, and renal disease.  - Check CMP, CBC diff, and  TSH - Avoid empagliflozin and Augmentin - Return to [facility] in 1-2 weeks     Vaginal pruritus Patient reports vaginal itching after starting Augmentin on 2-29 for bacterial sinusitis. Denies discharge and is otherwise asymptomatic.   - Cervicovaginal swab for Gono-Chla-Tric-Cand-BV      See Encounters Tab for problem based charting.  Patient seen with Dr.  Saverio Danker

## 2022-12-24 ENCOUNTER — Ambulatory Visit (INDEPENDENT_AMBULATORY_CARE_PROVIDER_SITE_OTHER): Payer: 59 | Admitting: Podiatry

## 2022-12-24 DIAGNOSIS — Q828 Other specified congenital malformations of skin: Secondary | ICD-10-CM

## 2022-12-24 DIAGNOSIS — E1142 Type 2 diabetes mellitus with diabetic polyneuropathy: Secondary | ICD-10-CM

## 2022-12-24 NOTE — Telephone Encounter (Signed)
Looks like she has an appointment in 2 days. Would recommend getting sample prior to starting treatment.

## 2022-12-24 NOTE — Progress Notes (Unsigned)
NO CHARGE VISIT:  Subjective:  Patient ID: Bridget Owens, female    DOB: 1956-03-29,  MRN: [id number]  Bridget Owens presents to clinic today for {jgcomplaint:23593}  Chief Complaint  Patient presents with   Nail Problem    [facility]  BS-did not check today A1C- Do not know PCP-Masters PCP VST- 2 weeks ago   New problem(s): None. {jgcomplaint:23593}  PCP is Masters, Joellen Jersey, DO.  Allergies  Allergen Reactions   Codeine Other (See Comments)    "felt funny"   Review of Systems: Negative except as noted in the HPI.  Objective: No changes noted in today's physical examination. There were no vitals filed for this visit. Bridget Owens is a pleasant 67 y.o. female WD, WN in NAD. AAO x 3.  Vascular CFT <3 seconds b/l LE. Palpable pedal pulses b/l LE. Pedal hair absent. No pain with calf compression b/l. Lower extremity skin temperature gradient within normal limits. No edema noted b/l LE. No cyanosis or clubbing noted b/l LE.  Neurologic Normal speech. Oriented to person, place, and time. Pt has subjective symptoms of neuropathy. Protective sensation intact 5/5 intact bilaterally with 10g monofilament b/l. Vibratory sensation intact b/l.  Dermatologic Pedal skin is warm and supple b/l LE. No open wounds b/l LE. No interdigital macerations noted b/l LE.   Toenails 1-5 b/l elongated, discolored, dystrophic, thickened, crumbly with subungual debris and tenderness to dorsal palpation.   Porokeratotic lesion(s) submet head 1 right foot, submet head 2 right foot and submet head 5 b/l. No erythema, no edema, no drainage, no fluctuance.  Orthopedic: Muscle strength 5/5 to all lower extremity muscle groups bilaterally. Tailor's bunion deformity noted b/l LE.   Radiographs: None Assessment/Plan: 1. Porokeratosis   2. Diabetic peripheral neuropathy associated with type 2 diabetes mellitus ([place])     No orders of the defined types were placed in this encounter.   None -Patient was evaluated and  treated. All patient's and/or POA's questions/concerns answered on today's visit. -As a courtesy, porokeratotic lesion(s) {jgPodToeLocator:23637} pared and enucleated without complication or incident. Total number pared=***. -Patient/POA to call should there be question/concern in the interim.   Return in about 3 months (around 03/26/2023).  Marzetta Board, DPM

## 2022-12-26 ENCOUNTER — Encounter: Payer: Self-pay | Admitting: Student

## 2022-12-26 ENCOUNTER — Ambulatory Visit (INDEPENDENT_AMBULATORY_CARE_PROVIDER_SITE_OTHER): Payer: 59 | Admitting: Student

## 2022-12-26 ENCOUNTER — Encounter: Payer: Medicare Other | Admitting: Internal Medicine

## 2022-12-26 ENCOUNTER — Other Ambulatory Visit: Payer: Self-pay

## 2022-12-26 VITALS — BP 138/65 | HR 85 | Temp 98.2°F | Resp 24 | Ht 66.0 in | Wt 168.2 lb

## 2022-12-26 DIAGNOSIS — L299 Pruritus, unspecified: Secondary | ICD-10-CM

## 2022-12-26 DIAGNOSIS — N898 Other specified noninflammatory disorders of vagina: Secondary | ICD-10-CM

## 2022-12-26 NOTE — Patient Instructions (Signed)
  Thank you, [name], for allowing [place] to provide your care today. Today we discussed . . .  > Generalized Itching       - your itching is possibly caused by a medication but we are ordering laboratory tests to rule out other causes       - please avoid empagliflozin and any antibiotics for now       - we will call you with the results   > Vaginal Itching       - we are ordering a vaginal self-swab test to determine the cause of your vaginal itching specifically       - we will call you with the results    I have ordered the following labs for you:  Lab Orders         CMP14 + Anion Gap         CBC with Diff         TSH       Tests ordered today:  Cervicovaginal self swab   Referrals ordered today:   Referral Orders  No referral(s) requested today      I have ordered the following medication/changed the following medications:   Stop the following medications: There are no discontinued medications.   Start the following medications: No orders of the defined types were placed in this encounter.     Follow up:  1-2 weeks     Remember:  Please avoid empagliflozin and any antibiotics until we determine the cause of your itching. We will call you with your laboratory test results.   Should you have any questions or concerns please call the internal medicine clinic at [phone].     Roswell Nickel, MD [place]

## 2022-12-26 NOTE — Assessment & Plan Note (Signed)
Patient developed an itchy rash that started sometime within the last three months. She is unsure exactly when the itching started. Involves her upper extremities, thighs, back, and lower face. Alleviated slightly by warm showers, cortisone cream has proven ineffective in providing any relief. Denies recent changes in bath soap, laundry detergent, fabric softener, skin lotions, and diet. History notable for two recent medication changes. On 1-16, she was prescribed empagliflozin and then five-day course of Augmentin started 2-29 for bacterial sinusitis. Both of these dates are within the symptom onset timeline provided by patient. Empagliflozin was discontinued on 3-13, after which patient noticed modest improvement in her itching. Further denies abdominal pain, bloating, nausea, diarrhea, cough, breath shortness, and leg swelling. Exam notable for patches of erythema involving back, facial folds, and bilateral upper extremities especially distal anterior forearm surfaces. Etiology remains unclear, most likely adverse drug reaction to either empagliflozin or Augmentin. Diagnostic workup warranted to rule out liver, biliary, thyroid, and renal disease.  - Check CMP, CBC diff, and TSH - Avoid empagliflozin and Augmentin - Return to [facility] in 1-2 weeks

## 2022-12-26 NOTE — Assessment & Plan Note (Signed)
Patient reports vaginal itching after starting Augmentin on 2-29 for bacterial sinusitis. Denies discharge and is otherwise asymptomatic.   - Cervicovaginal swab for Gono-Chla-Tric-Cand-BV

## 2022-12-27 LAB — CMP14 + ANION GAP
ALT: 18 IU/L (ref 0–32)
AST: 12 IU/L (ref 0–40)
Albumin/Globulin Ratio: 1.5 (ref 1.2–2.2)
Albumin: 4.4 g/dL (ref 3.9–4.9)
Alkaline Phosphatase: 89 IU/L (ref 44–121)
Anion Gap: 19 mmol/L — ABNORMAL HIGH (ref 10.0–18.0)
BUN/Creatinine Ratio: 17 (ref 12–28)
BUN: 11 mg/dL (ref 8–27)
Bilirubin Total: <0.2 mg/dL (ref 0.0–1.2)
CO2: 22 mmol/L (ref 20–29)
Calcium: 9.9 mg/dL (ref 8.7–10.3)
Chloride: 100 mmol/L (ref 96–106)
Creatinine, Ser: 0.63 mg/dL (ref 0.57–1.00)
Globulin, Total: 2.9 g/dL (ref 1.5–4.5)
Glucose: 178 mg/dL — ABNORMAL HIGH (ref 70–99)
Potassium: 4.4 mmol/L (ref 3.5–5.2)
Sodium: 141 mmol/L (ref 134–144)
Total Protein: 7.3 g/dL (ref 6.0–8.5)
eGFR: 97 mL/min/{1.73_m2} (ref >59)

## 2022-12-27 LAB — CBC WITH DIFFERENTIAL/PLATELET
Basophils Absolute: 0.1 10*3/uL (ref 0.0–0.2)
Basos: 1 %
EOS (ABSOLUTE): 0.1 10*3/uL (ref 0.0–0.4)
Eos: 1 %
Hematocrit: 42.3 % (ref 34.0–46.6)
Hemoglobin: 13.9 g/dL (ref 11.1–15.9)
Immature Grans (Abs): 0 10*3/uL (ref 0.0–0.1)
Immature Granulocytes: 0 %
Lymphocytes Absolute: 2 10*3/uL (ref 0.7–3.1)
Lymphs: 26 %
MCH: 28.6 pg (ref 26.6–33.0)
MCHC: 32.9 g/dL (ref 31.5–35.7)
MCV: 87 fL (ref 79–97)
Monocytes Absolute: 0.5 10*3/uL (ref 0.1–0.9)
Monocytes: 6 %
Neutrophils Absolute: 5.3 10*3/uL (ref 1.4–7.0)
Neutrophils: 66 %
Platelets: 354 10*3/uL (ref 150–450)
RBC: 4.86 x10E6/uL (ref 3.77–5.28)
RDW: 12.4 % (ref 11.7–15.4)
WBC: 7.9 10*3/uL (ref 3.4–10.8)

## 2022-12-27 LAB — TSH: TSH: 1.69 u[IU]/mL (ref 0.450–4.500)

## 2022-12-28 NOTE — Addendum Note (Signed)
Addended by: Charise Killian on: 12/28/2022 08:37 AM   Modules accepted: Level of Service

## 2022-12-28 NOTE — Progress Notes (Signed)
Internal Medicine Clinic Attending  I saw and evaluated the patient.  I personally confirmed the key portions of the history and exam documented by Dr. Jodi Mourning and I reviewed pertinent patient test results.  The assessment, diagnosis, and plan were formulated together and I agree with the documentation in the resident's note. Difficulty eliciting clear onset of symptoms from patient history, sometime between January and the end of February. No clear evidence of primary skin lesions on examination. Mild erythema over forearms, likely secondary to scratching. No erythema noted on back or face. No xerosis. Lab evaluation for secondary causes unremarkable. Possibly related to recent medications. We have discussed return in 1-2 weeks to monitor improvement off medications and continue evaluation if persistent.

## 2023-01-03 ENCOUNTER — Other Ambulatory Visit: Payer: Self-pay | Admitting: Student

## 2023-01-03 DIAGNOSIS — Z1231 Encounter for screening mammogram for malignant neoplasm of breast: Secondary | ICD-10-CM

## 2023-01-08 ENCOUNTER — Telehealth: Payer: Self-pay | Admitting: Internal Medicine

## 2023-01-08 ENCOUNTER — Other Ambulatory Visit: Payer: Self-pay | Admitting: Internal Medicine

## 2023-01-08 MED ORDER — HYDROXYZINE HCL 10 MG PO TABS: 10.0000 mg | ORAL_TABLET | Freq: Three times a day (TID) | ORAL | 0 refills | Status: AC | PRN

## 2023-01-08 NOTE — Progress Notes (Signed)
Called patient back regarding her lab results from last week. Everything was normal, however, patient is still having diffuse pruritus. Sent in hydroxyzine 10 mg TID PRN, to see if that helps her sxs. She has a follow up appt with the [facility] on 4/9.

## 2023-01-08 NOTE — Telephone Encounter (Signed)
Pt requesting a call back about her lab work on 12/26/2022. Pt states she was told her the Dr. Lynnda Child contact her back about her itching all over as well.

## 2023-01-15 ENCOUNTER — Encounter: Payer: 59 | Admitting: Student

## 2023-01-22 NOTE — Progress Notes (Incomplete)
Subjective:  [name] is a 67 y.o. who presents to clinic for the following:  No chief complaint on file.   ***  ROS ***  Patient Active Problem List   Diagnosis Date Noted  . Vaginal pruritus 12/26/2022  . Neuropathy, thoracic (radicular) 12/07/2022  . Left axillary pain 04/12/2022  . Intertrigo 02/28/2022  . Abdominal tenderness, epigastric 10/17/2021  . Aortic atherosclerosis 08/29/2021  . Left ankle pain 08/29/2021  . Loss of appetite 07/13/2021  . Leg pain, bilateral 06/19/2021  . Left knee pain 05/21/2021  . Mononeuropathy of left lower extremity 04/14/2021  . Bacterial sinusitis 02/16/2021  . Mass of upper inner quadrant of left breast 10/10/2020  . Breast pain, left 09/26/2020  . Non-scarring hair loss 07/27/2019  . Generalized pruritus 12/31/2018  . Upper respiratory infection 10/11/2017  . Hypertrophic toenail 09/05/2017  . Pulmonary fibrosis, unspecified 09/05/2016  . 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 Mixed incontinence urge and stress 12/21/2014  . Neuropathy, lower extremity 12/21/2014  . Ear pain, bilateral 05/30/2014  . Cigarette smoker 04/03/2013  . Health care maintenance 02/03/2013  . 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 Hemorrhoids 02/07/2012  . GERD (gastroesophageal reflux disease) 08/13/2011  . Type 2 diabetes mellitus with peripheral neuropathy 11/11/2006  . Hyperlipidemia 11/11/2006  . Hypertensive retinopathy 11/11/2006  . Essential hypertension 11/11/2006  . Hot flashes due to menopause 11/11/2006    Allergies  Allergen Reactions  . Codeine Other (See Comments)    "felt funny"     Current Outpatient Medications on File Prior to Visit  Medication Sig Dispense Refill  . Accu-Chek FastClix Lancets MISC Check up to two times a day. 204 each 3  . amLODipine (NORVASC) 5 MG tablet Take 1 tablet (5 mg total) by mouth daily. 30 tablet 11  . aspirin 81 MG EC tablet Take 81 mg by mouth daily.   (Patient not taking:  Reported on 06/19/2022)    . atorvastatin (LIPITOR) 40 MG tablet TAKE 1 TABLET(40 MG) BY MOUTH DAILY 90 tablet 3  . BISACODYL 5 MG EC tablet Take by mouth as directed.    . Blood Glucose Monitoring Suppl (ACCU-CHEK GUIDE) w/Device KIT USE AS DIRECTED 1 kit 0  . cetirizine HCl (ZYRTEC) 5 MG/5ML SOLN Take 5 mLs (5 mg total) by mouth daily. 118 mL 3  . clotrimazole (LOTRIMIN) 1 % cream Apply 1 application. topically 2 (two) times daily. To area under the breasts 30 g 0  . diclofenac Sodium (VOLTAREN) 1 % GEL Apply 2 g topically daily as needed. (Patient not taking: Reported on 06/19/2022) 2 g 0  . empagliflozin (JARDIANCE) 10 MG TABS tablet Take 1 tablet (10 mg total) by mouth daily before breakfast. 90 tablet 3  . gabapentin (NEURONTIN) 300 MG capsule Take 1 capsule (300 mg total) by mouth 3 (three) times daily. 90 capsule 2  . glucose blood (ACCU-CHEK GUIDE) test strip CHECK BLOOD SUGAR UP TO TWICE DAILY 200 strip 3  . hydrochlorothiazide (HYDRODIURIL) 25 MG tablet TAKE 1 TABLET(25 MG) BY MOUTH DAILY 90 tablet 2  . lidocaine (LIDODERM) 5 % Place 1 patch onto the skin daily. Remove & Discard patch within 12 hours or as directed by MD 30 patch 0  . losartan (COZAAR) 50 MG tablet Take 1 tablet (50 mg total) by mouth daily. 90 tablet 0  . metFORMIN (GLUCOPHAGE-XR) 500 MG 24 hr tablet Take 1 tablet (500 mg total) by mouth 2 (two) times daily with a meal.  180 tablet 3  . nystatin-triamcinolone ointment (MYCOLOG) Apply 1 Application topically 2 (two) times daily. 30 g 0  . Sodium Chloride-Sodium Bicarb (NETI POT SINUS WASH) 2300-700 MG KIT Use to wash out sinuses twice daily as needed 1 kit 0   No current facility-administered medications on file prior to visit.    Past Surgical History:  Procedure Laterality Date  . TUBAL LIGATION      Family History  Problem Relation Age of Onset  . Pneumonia Mother   . Diabetes Mother   . Early death Father   . Diabetes Sister   . Diabetes Brother   .  Diabetes Maternal Grandmother     Social History   Socioeconomic History  . Marital status: Single    Spouse name: Not on file  . Number of children: Not on file  . Years of education: Not on file  . Highest education level: Not on file  Occupational History  . Not on file  Tobacco Use  . Smoking status: Every Day    Packs/day: 0.50    Years: 47.00    Additional pack years: 0.00    Total pack years: 23.50    Types: Cigarettes  . Smokeless tobacco: Former    Quit date: 11/28/2010  . Tobacco comments:    5-6 cigs per day  Vaping Use  . Vaping Use: Never used  Substance and Sexual Activity  . Alcohol use: No    Alcohol/week: 0.0 standard drinks of alcohol  . Drug use: No  . Sexual activity: Not on file  Other Topics Concern  . Not on file  Social History Narrative  . Not on file   Social Determinants of Health   Financial Resource Strain: Low Risk  (09/11/2022)   Overall Financial Resource Strain (CARDIA)   . Difficulty of Paying Living Expenses: Not hard at all  Food Insecurity: Food Insecurity Present (12/06/2022)   Hunger Vital Sign   . Worried About Programme researcher, broadcasting/film/video in the Last Year: Sometimes true   . Ran Out of Food in the Last Year: Sometimes true  Transportation Needs: Unmet Transportation Needs (12/06/2022)   PRAPARE - Transportation   . Lack of Transportation (Medical): Yes   . Lack of Transportation (Non-Medical): No  Physical Activity: Insufficiently Active (09/11/2022)   Exercise Vital Sign   . Days of Exercise per Week: 7 days   . Minutes of Exercise per Session: 10 min  Stress: No Stress Concern Present (09/11/2022)   [employer] of Occupational Health - Occupational Stress Questionnaire   . Feeling of Stress : Not at all  Social Connections: Socially Isolated (12/06/2022)   Social Connection and Isolation Panel [NHANES]   . Frequency of Communication with Friends and Family: More than three times a week   . Frequency of Social Gatherings with  Friends and Family: More than three times a week   . Attends Religious Services: Never   . Active Member of Clubs or Organizations: No   . Attends Banker Meetings: Never   . Marital Status: Never married  Intimate Partner Violence: Not At Risk (09/11/2022)   Humiliation, Afraid, Rape, and Kick questionnaire   . Fear of Current or Ex-Partner: No   . Emotionally Abused: No   . Physically Abused: No   . Sexually Abused: No    Objective:  There were no vitals filed for this visit.  Physical Exam ***  Assessment & Plan:  There were no encounter diagnoses.  No  problem-specific Assessment & Plan notes found for this encounter.    No follow-ups on file.  Patient {GC/GE:[id number]::"discussed with","seen with"} Dr. {WUJWJ:[id number]::"NFAOZHYQ","M. Hoffman","Mullen","Narendra","Machen","Vincent","Guilloud","Lau"}  Marrianne Mood MD 01/22/2023, 1:47 PM  Pager: [phone]

## 2023-01-23 ENCOUNTER — Ambulatory Visit (INDEPENDENT_AMBULATORY_CARE_PROVIDER_SITE_OTHER): Payer: 59 | Admitting: Student

## 2023-01-23 ENCOUNTER — Telehealth: Payer: Self-pay

## 2023-01-23 ENCOUNTER — Encounter: Payer: Self-pay | Admitting: Student

## 2023-01-23 VITALS — BP 171/77 | HR 75 | Temp 98.4°F | Wt 167.1 lb

## 2023-01-23 DIAGNOSIS — Z7985 Long-term (current) use of injectable non-insulin antidiabetic drugs: Secondary | ICD-10-CM

## 2023-01-23 DIAGNOSIS — Z7984 Long term (current) use of oral hypoglycemic drugs: Secondary | ICD-10-CM

## 2023-01-23 DIAGNOSIS — E1142 Type 2 diabetes mellitus with diabetic polyneuropathy: Secondary | ICD-10-CM | POA: Diagnosis not present

## 2023-01-23 DIAGNOSIS — M5414 Radiculopathy, thoracic region: Secondary | ICD-10-CM

## 2023-01-23 DIAGNOSIS — I10 Essential (primary) hypertension: Secondary | ICD-10-CM

## 2023-01-23 DIAGNOSIS — R202 Paresthesia of skin: Secondary | ICD-10-CM

## 2023-01-23 LAB — POCT GLYCOSYLATED HEMOGLOBIN (HGB A1C): Hemoglobin A1C: 9 % — AB (ref 4.0–5.6)

## 2023-01-23 LAB — GLUCOSE, CAPILLARY: Glucose-Capillary: 217 mg/dL — ABNORMAL HIGH (ref 70–99)

## 2023-01-23 MED ORDER — LOSARTAN POTASSIUM-HCTZ 50-12.5 MG PO TABS: 1.0000 | ORAL_TABLET | Freq: Every day | ORAL | 0 refills | Status: DC

## 2023-01-23 MED ORDER — SEMAGLUTIDE(0.25 OR 0.5MG/DOS) 2 MG/3ML ~~LOC~~ SOPN: 0.2500 mg | PEN_INJECTOR | SUBCUTANEOUS | 0 refills | Status: DC

## 2023-01-23 MED ORDER — GABAPENTIN 400 MG PO CAPS: 400.0000 mg | ORAL_CAPSULE | Freq: Three times a day (TID) | ORAL | 2 refills | Status: DC

## 2023-01-23 NOTE — Assessment & Plan Note (Signed)
Poor glycemic control of late.  A1c is around 9.  Up from 7.8 at last visit.  Did a 24-hour diet review.  Identified some opportunities for improvement there.  Recommend no more than 1 sugary drink (either soda or juice) per day and adding more fresh vegetables and legumes like black-eyed peas, pinto beans, etc., which she enjoys.  Although she prefers not to have injectable medications, I think she is motivated by her neuropathic symptoms to try Ozempic again.  We talked about rotating injection sites weekly for this medicine. - Restart Ozempic 0.25 mg weekly to increase at 4 weeks - Continue metformin 500 mg twice daily

## 2023-01-23 NOTE — Patient Instructions (Addendum)
Today we discussed itching, side pain, diabetes, and high blood pressure.  Restart Ozempic once weekly. You can inject in your thighs and upper arms.  For your blood pressure, I'm starting a combination medicine called losartan-hydrochlorothiazide. Take this once daily.  Return in 4 weeks, for blood pressure check, itching, and flank pain.   I will call you with the results of the following laboratory test(s):  Lab Orders         Glucose, capillary         POC Hbg A1C      Please call our clinic at [phone] Monday through Friday from 9 am to 4 pm if you have questions or concerns about your health. If after hours or on the weekend, call the main hospital number and ask for the Internal Medicine Resident On-Call. If you need medication refills, please notify your pharmacy one week in advance and they will send [place] a request.   Best, Marrianne Mood, MD [facility]

## 2023-01-23 NOTE — Assessment & Plan Note (Addendum)
Itching, but no rash today.  Despite removal of potentially offending medications at last visit, the pruritus persists.  Workup for hepatic and renal etiologies then was unremarkable.  This person suspects a correlation between her blood sugar and her pruritus.  Quick review of the literature suggests an association.  At any rate, it does not seem like there is a dangerous underlying organic etiology for this symptom.  Will work on improving glycemic control.  On further review of the chart there is a lot of documentation of the symptoms under notalgia paresthetica.  Apparently it has been ongoing for several years.  Many etiologies have been considered without a definitive diagnosis.  Although notalgia paresthetica usually is unilateral and over the medial aspect of the inferior scapula, this person has itching over back, arms, and buttocks.  However it appears as though initially her symptoms were mostly confined to to the back, which is consistent with notalgia paresthetica.  I think this is a reasonable explanation for itching today.

## 2023-01-23 NOTE — Assessment & Plan Note (Signed)
Still with pretty severe anterolateral right flank tenderness, worst over the inferior part of the right rib cage.  Sometimes seems to radiate to the abdomen.  She describes allodynia, even struggles to wear shirts sometimes because of the pain.  She can barely stand to light touch over this area.  This interferes with playing with her grandchildren.  I wonder if this is related to the notalgia paresthetica or if this is some kind of thoracic radiculopathy.  The skin allodynia favors a small/superficial nerve polyneuropathy rather than a radiculopathy I suppose.  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  Will work on improved glycemic control.  I wonder about a localized approach like an anesthetic injection around the site of her known thoracic foraminal stenosis.  In the meantime, can increase gabapentin as she is tolerating this medication well without side effects. - Increase gabapentin to 400 mg 3 times daily

## 2023-01-23 NOTE — Telephone Encounter (Signed)
Incoming fax from pharmacy regarding rx for losartan-hctz requesting a 90 day supply.

## 2023-01-23 NOTE — Assessment & Plan Note (Signed)
Elevated BP today.  Maximum 171/77.  Chart shows good fill history for all of her BP medicine, but she claims that she does not take losartan.  Today, will restart losartan and combine with HCTZ.  Continue amlodipine.  Return in 4 weeks for blood pressure check and therapy titration. - Losartan-HCTZ 50-12.5 mg daily - Amlodipine 5 mg daily

## 2023-01-23 NOTE — Addendum Note (Signed)
Addended by: Marrianne Mood on: 01/23/2023 03:54 PM   Modules accepted: Orders

## 2023-01-28 ENCOUNTER — Encounter: Payer: Self-pay | Admitting: Internal Medicine

## 2023-01-28 NOTE — Progress Notes (Signed)
Internal Medicine Clinic Attending  I saw and evaluated the patient.  I personally confirmed the key portions of the history and exam documented by Dr. Benito Mccreedy and I reviewed pertinent patient test results.  The assessment, diagnosis, and plan were formulated together and I agree with the documentation in the resident's note. Interesting symptoms are suggestive of thoracic radicular neuropathy (she has never had shingles, but a post herpetic neuralgia could present this way). Symptomatic management.  The notalgia paresthetica involves only the patch of upper back skin and would not explain her more generalized pruritus.  Her skin is in good condition with no xerosis, rash, or lichen dermatitis.  Camphor/menthol topical and reassurance.  As noted, there is no hepatobiliary or renal cause.  Incidentally, daughter is concerned that her mom may not be remembering well and is here to provide and receive info.

## 2023-02-19 ENCOUNTER — Other Ambulatory Visit: Payer: Self-pay | Admitting: Student

## 2023-02-19 DIAGNOSIS — E1142 Type 2 diabetes mellitus with diabetic polyneuropathy: Secondary | ICD-10-CM

## 2023-02-21 ENCOUNTER — Ambulatory Visit (INDEPENDENT_AMBULATORY_CARE_PROVIDER_SITE_OTHER): Payer: 59 | Admitting: Student

## 2023-02-21 ENCOUNTER — Other Ambulatory Visit: Payer: Self-pay

## 2023-02-21 ENCOUNTER — Encounter: Payer: Self-pay | Admitting: Student

## 2023-02-21 VITALS — BP 148/71 | HR 77 | Temp 97.9°F | Ht 67.0 in | Wt 169.8 lb

## 2023-02-21 DIAGNOSIS — E1142 Type 2 diabetes mellitus with diabetic polyneuropathy: Secondary | ICD-10-CM | POA: Diagnosis not present

## 2023-02-21 DIAGNOSIS — M79605 Pain in left leg: Secondary | ICD-10-CM

## 2023-02-21 DIAGNOSIS — E1151 Type 2 diabetes mellitus with diabetic peripheral angiopathy without gangrene: Secondary | ICD-10-CM

## 2023-02-21 DIAGNOSIS — I10 Essential (primary) hypertension: Secondary | ICD-10-CM | POA: Diagnosis not present

## 2023-02-21 DIAGNOSIS — I739 Peripheral vascular disease, unspecified: Secondary | ICD-10-CM

## 2023-02-21 DIAGNOSIS — Z7985 Long-term (current) use of injectable non-insulin antidiabetic drugs: Secondary | ICD-10-CM

## 2023-02-21 MED ORDER — OZEMPIC (0.25 OR 0.5 MG/DOSE) 2 MG/3ML ~~LOC~~ SOPN: 0.2500 mg | PEN_INJECTOR | SUBCUTANEOUS | 3 refills | Status: AC

## 2023-02-21 MED ORDER — FLUTICASONE PROPIONATE 50 MCG/ACT NA SUSP: 2.0000 | Freq: Every day | NASAL | 3 refills | Status: AC

## 2023-02-21 MED ORDER — LOSARTAN POTASSIUM-HCTZ 100-12.5 MG PO TABS: 1.0000 | ORAL_TABLET | Freq: Every day | ORAL | 3 refills | Status: AC

## 2023-02-21 NOTE — Progress Notes (Signed)
Established Patient Office Visit  Subjective   Patient ID: Bridget Owens, female    DOB: Sep 09, 1956  Age: 67 y.o. MRN: [id number]  Chief Complaint  Patient presents with   Follow-up    FOLLOW UP APPT / MEDICATION REFILL / DM / CHRONIC LEFT PAIN #7 / DRY SKIN FACE-ITCH ALL THE TIME    Bridget Owens is a 67 y.o. person living with a history listed below who presents to clinic for hypertension follow up. Please refer to problem based charting for further details and assessment and plan of current problem and chronic medical conditions.    Patient Active Problem List   Diagnosis Date Noted   PAD (peripheral artery disease) (HCC) 02/22/2023   Vaginal pruritus 12/26/2022   Neuropathy, thoracic (radicular) 12/07/2022   Left axillary pain 04/12/2022   Intertrigo 02/28/2022   Abdominal tenderness, epigastric 10/17/2021   Aortic atherosclerosis (HCC) 08/29/2021   Left ankle pain 08/29/2021   Loss of appetite 07/13/2021   Leg pain, bilateral 06/19/2021   Left knee pain 05/21/2021   Mononeuropathy of left lower extremity 04/14/2021   Bacterial sinusitis 02/16/2021   Mass of upper inner quadrant of left breast 10/10/2020   Breast pain, left 09/26/2020   Non-scarring hair loss 07/27/2019   Upper respiratory infection 10/11/2017   Hypertrophic toenail 09/05/2017   Pulmonary fibrosis, unspecified (HCC) 09/05/2016   Notalgia paresthetica, resolved 01/09/2016   Mixed incontinence urge and stress 12/21/2014   Neuropathy, lower extremity 12/21/2014   Ear pain, bilateral 05/30/2014   Generalized pruritus 02/05/2014   Cigarette smoker 04/03/2013   Health care maintenance 02/03/2013   Brain lipoma, 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   Hemorrhoids 02/07/2012   GERD (gastroesophageal reflux disease) 08/13/2011   Type 2 diabetes mellitus with peripheral neuropathy (HCC) 11/11/2006   Hyperlipidemia 11/11/2006   Hypertensive retinopathy 11/11/2006   Essential hypertension 11/11/2006   Hot flashes due  to menopause 11/11/2006   ROS: negative as per HPI     Objective:     BP (!) 148/71 (BP Location: Right Arm, Patient Position: Sitting, Cuff Size: Normal)   Pulse 77   Temp 97.9 F (36.6 C) (Oral)   Ht 5\' 7"  (1.702 m)   Wt 169 lb 12.8 oz (77 kg)   SpO2 98%   BMI 26.59 kg/m  BP Readings from Last 3 Encounters:  02/21/23 (!) 148/71  01/23/23 (!) 171/77  12/26/22 138/65      Physical Exam Constitutional:      Appearance: Normal appearance.  HENT:     Mouth/Throat:     Mouth: Mucous membranes are moist.     Pharynx: Oropharynx is clear.  Cardiovascular:     Rate and Rhythm: Normal rate and regular rhythm.     Heart sounds: No murmur heard.    Comments: Normal dorsalis pedis and posterior tibial pulses on the left, diminished pulses on the right lower extremity Pulmonary:     Effort: Pulmonary effort is normal.     Breath sounds: No rhonchi or rales.  Abdominal:     General: Abdomen is flat. Bowel sounds are normal. There is no distension.     Palpations: Abdomen is soft.     Tenderness: There is no abdominal tenderness.  Musculoskeletal:        General: Normal range of motion.     Right lower leg: No edema.     Left lower leg: No edema.     Comments: No warmth or swelling of the left thigh.  Normal range of motion of the left hip and knee with mild left knee crepitus and joint line tenderness.  No effusion of the knee  Skin:    General: Skin is warm and dry.     Capillary Refill: Capillary refill takes less than 2 seconds.     Findings: No lesion or rash.  Neurological:     General: No focal deficit present.     Mental Status: She is alert and oriented to person, place, and time.  Psychiatric:        Mood and Affect: Mood normal.        Behavior: Behavior normal.      Results for orders placed or performed in visit on 02/21/23  BMP8+Anion Gap  Result Value Ref Range   Glucose 96 70 - 99 mg/dL   BUN 10 8 - 27 mg/dL   Creatinine, Ser 4.09 0.57 - 1.00 mg/dL    eGFR 81 >81 XB/JYN/8.29   BUN/Creatinine Ratio 13 12 - 28   Sodium 137 134 - 144 mmol/L   Potassium 3.9 3.5 - 5.2 mmol/L   Chloride 96 96 - 106 mmol/L   CO2 23 20 - 29 mmol/L   Anion Gap 18.0 10.0 - 18.0 mmol/L   Calcium 10.2 8.7 - 10.3 mg/dL       The 56-OZHY ASCVD risk score (Arnett DK, et al., 2019) is: 47.6%    Assessment & Plan:   Problem List Items Addressed This Visit     Type 2 diabetes mellitus with peripheral neuropathy (HCC) (Chronic)    Was restarted on Ozempic 0.25 mg weekly.  No GI symptoms associated with starting Ozempic.  Does report some loose stools when on Ozempic 0.5 mg weekly.  Will increase her Ozempic to 0.5 mg weekly.  Counseled her GI symptoms tend to improve with time but to call our office if having significant diarrhea with increased dose.  Follow-up in 2 months for repeat A1c.        Relevant Medications   Semaglutide,0.25 or 0.5MG /DOS, (OZEMPIC, 0.25 OR 0.5 MG/DOSE,) 2 MG/3ML SOPN   losartan-hydrochlorothiazide (HYZAAR) 100-12.5 MG tablet   Essential hypertension - Primary (Chronic)    Able to get new BP medications. No side effects with restarting losartan.  BP remains elevated at 147/68 and 148/71 in office today, however pressure is improved since increasing her medications.  Will increase her losartan hydrochlorothiazide to 100-12.5 mg daily.  BMP today.      Relevant Medications   losartan-hydrochlorothiazide (HYZAAR) 100-12.5 MG tablet   Other Relevant Orders   BMP8+Anion Gap (Completed)   PAD (peripheral artery disease) (HCC)    Patient reports heaviness in the left thigh for the last several months.  She reports no pain in the thigh.  Does have left knee pain due to osteoarthritis.  She is unclear if laying down or exercising increases the heaviness of her legs.  Does have neuropathic symptoms in her feet due to diabetes however not present in her.  Does not recall any injury.  There is no swelling of her lower extremities.  Given  heaviness may increase with exercise and risk factors including atherosclerosis, diabetes, hypertension, ABIs checked today.  Diminished pulses in the right lower extremity but has good pulses on the left.  Screening ABIs in office normal on the left however right-sided ABI abnormal may be related to incompressible arteries versus severe PAD.  She is on aspirin and statin.  Referral for formal ABI and TBI to further evaluate Conservative  measures with Tylenol, heat, maintaining physical activity for thigh heaviness.      Relevant Medications   losartan-hydrochlorothiazide (HYZAAR) 100-12.5 MG tablet   Other Visit Diagnoses     Leg pain, anterior, left       Relevant Orders   POCT ABI Screening Pilot No Charge (Completed)   VAS [place] ABI WITH/WO TBI       Return in about 2 months (around 04/23/2023).    Quincy Simmonds, MD

## 2023-02-21 NOTE — Assessment & Plan Note (Addendum)
Able to get new BP medications. No side effects with restarting losartan.  BP remains elevated at 147/68 and 148/71 in office today, however pressure is improved since increasing her medications.  Will increase her losartan hydrochlorothiazide to 100-12.5 mg daily.  BMP today.

## 2023-02-21 NOTE — Patient Instructions (Addendum)
It was a pleasure seeing you in clinic today  Please increase your losartan-HCTZ to 100-12.5 mg daily  Please increase Ozmpic to 0.5 mg weekly   For leg heaviness continue to keep active Bridget Owens try warm compresses and volaten gel to help with this   You blood low appear abnormal in the right leg but out office test is inconclusive. We will send you for formal testing for this and I will call with results   Follow up in 2 months

## 2023-02-21 NOTE — Assessment & Plan Note (Addendum)
Was restarted on Ozempic 0.25 mg weekly.  No GI symptoms associated with starting Ozempic.  Does report some loose stools when on Ozempic 0.5 mg weekly.  Will increase her Ozempic to 0.5 mg weekly.  Counseled her GI symptoms tend to improve with time but to call our office if having significant diarrhea with increased dose.  Follow-up in 2 months for repeat A1c.

## 2023-02-22 DIAGNOSIS — I739 Peripheral vascular disease, unspecified: Secondary | ICD-10-CM | POA: Insufficient documentation

## 2023-02-22 LAB — BMP8+ANION GAP
Anion Gap: 18 mmol/L (ref 10.0–18.0)
BUN/Creatinine Ratio: 13 (ref 12–28)
BUN: 10 mg/dL (ref 8–27)
CO2: 23 mmol/L (ref 20–29)
Calcium: 10.2 mg/dL (ref 8.7–10.3)
Chloride: 96 mmol/L (ref 96–106)
Creatinine, Ser: 0.79 mg/dL (ref 0.57–1.00)
Glucose: 96 mg/dL (ref 70–99)
Potassium: 3.9 mmol/L (ref 3.5–5.2)
Sodium: 137 mmol/L (ref 134–144)
eGFR: 81 mL/min/{1.73_m2} (ref >59)

## 2023-02-22 NOTE — Assessment & Plan Note (Signed)
Patient reports heaviness in the left thigh for the last several months.  She reports no pain in the thigh.  Does have left knee pain due to osteoarthritis.  She is unclear if laying down or exercising increases the heaviness of her legs.  Does have neuropathic symptoms in her feet due to diabetes however not present in her.  Does not recall any injury.  There is no swelling of her lower extremities.  Given heaviness may increase with exercise and risk factors including atherosclerosis, diabetes, hypertension, ABIs checked today.  Diminished pulses in the right lower extremity but has good pulses on the left.  Screening ABIs in office normal on the left however right-sided ABI abnormal may be related to incompressible arteries versus severe PAD.  She is on aspirin and statin.  Referral for formal ABI and TBI to further evaluate Conservative measures with Tylenol, heat, maintaining physical activity for thigh heaviness.

## 2023-02-22 NOTE — Progress Notes (Signed)
Internal Medicine Clinic Attending ? ?Case discussed with Dr. Liang  At the time of the visit.  We reviewed the resident?s history and exam and pertinent patient test results.  I agree with the assessment, diagnosis, and plan of care documented in the resident?s note. ? ?

## 2023-03-06 ENCOUNTER — Ambulatory Visit
Admission: RE | Admit: 2023-03-06 | Discharge: 2023-03-06 | Disposition: A | Discharge status: Home / Self Care | Payer: 59 | Source: Ambulatory Visit | Attending: Internal Medicine | Admitting: Internal Medicine

## 2023-03-06 ENCOUNTER — Other Ambulatory Visit: Payer: Self-pay | Admitting: Student

## 2023-03-06 DIAGNOSIS — R928 Other abnormal and inconclusive findings on diagnostic imaging of breast: Secondary | ICD-10-CM

## 2023-03-06 DIAGNOSIS — Z1231 Encounter for screening mammogram for malignant neoplasm of breast: Secondary | ICD-10-CM

## 2023-03-12 ENCOUNTER — Telehealth: Payer: Self-pay

## 2023-03-12 NOTE — Telephone Encounter (Addendum)
Patient stated she is supposed to have a vascular ABI done.

## 2023-03-18 ENCOUNTER — Inpatient Hospital Stay: Admission: RE | Admit: 2023-03-18 | Payer: 59 | Source: Ambulatory Visit

## 2023-03-21 ENCOUNTER — Ambulatory Visit
Admission: RE | Admit: 2023-03-21 | Discharge: 2023-03-21 | Disposition: A | Discharge status: Home / Self Care | Payer: 59 | Source: Ambulatory Visit | Attending: Internal Medicine | Admitting: Internal Medicine

## 2023-03-25 ENCOUNTER — Ambulatory Visit (HOSPITAL_COMMUNITY): Payer: 59

## 2023-03-26 ENCOUNTER — Ambulatory Visit (HOSPITAL_COMMUNITY)
Admission: RE | Admit: 2023-03-26 | Discharge: 2023-03-26 | Disposition: A | Discharge status: Home / Self Care | Payer: 59 | Source: Ambulatory Visit | Attending: Internal Medicine | Admitting: Internal Medicine

## 2023-03-26 DIAGNOSIS — M79605 Pain in left leg: Secondary | ICD-10-CM | POA: Diagnosis present

## 2023-03-26 DIAGNOSIS — I739 Peripheral vascular disease, unspecified: Secondary | ICD-10-CM | POA: Insufficient documentation

## 2023-03-26 LAB — VAS US ABI WITH/WO TBI
Left ABI: 0.93
Right ABI: 0.6

## 2023-04-02 ENCOUNTER — Ambulatory Visit (INDEPENDENT_AMBULATORY_CARE_PROVIDER_SITE_OTHER): Payer: 59 | Admitting: Podiatry

## 2023-04-02 DIAGNOSIS — E1142 Type 2 diabetes mellitus with diabetic polyneuropathy: Secondary | ICD-10-CM

## 2023-04-02 DIAGNOSIS — L84 Corns and callosities: Secondary | ICD-10-CM

## 2023-04-02 DIAGNOSIS — B351 Tinea unguium: Secondary | ICD-10-CM

## 2023-04-02 NOTE — Progress Notes (Signed)
       Subjective:  Patient ID: Bridget Owens, female    DOB: 1956/02/14,  MRN: [id number]   Bridget Owens presents to clinic today for:  Chief Complaint  Patient presents with   Callouses    Pt states she has a painful callus on her left foot and she is also here for a RFC  . Patient notes nails are thick, discolored, elongated and painful in shoegear when trying to ambulate.  She also notes painful corns and calluses on the bottom of her feet bilaterally.  This is a recurring issue for her.  She notes that she prefers to stay with her own nails and because our Dremel is too painful for her.  PCP is Masters, Florentina Addison, DO.  Allergies  Allergen Reactions   Codeine Other (See Comments)    "felt funny"    Review of Systems: Negative except as noted in the HPI.  Objective:  There were no vitals filed for this visit.  MAHITHA HICKLING is a pleasant 67 y.o. female in NAD. AAO x 3.  Vascular Examination: Capillary refill time is 3 delayed to toes bilateral.  DP pulse 2/4, PT pulse 0/4 b/l LE. Digital hair sparse b/l. No pedal edema b/l. Skin temperature gradient WNL b/l. No varicosities b/l. No cyanosis or clubbing noted b/l.   Dermatological Examination: Pedal skin with slight decreased turgor and atrophic skin b/l. No open wounds. No interdigital macerations b/l. Toenails x10 are 3mm thick, discolored, dystrophic with subungual debris. There is pain with compression of the nail plates.  They are elongated x10.  Focal hyperkeratotic lesion submet 5 bilateral.  Also submet 2 hyperkeratotic lesion right foot  Neurological Examination: Protective sensation intact bilateral LE. Vibratory sensation diminished bilateral LE.  Musculoskeletal Examination: Muscle strength 5/5 to all LE muscle groups b/l.      Latest Ref Rng & Units 01/23/2023    9:27 AM 10/11/2022   10:57 AM 06/27/2022   11:14 AM  Hemoglobin A1C  Hemoglobin-A1c 4.0 - 5.6 % 9.0  7.9  9.1    Assessment/Plan: 1. Diabetic  peripheral neuropathy associated with type 2 diabetes mellitus (HCC)   2. Dermatophytosis of nail   3. Callus of foot     The mycotic toenails were sharply debrided x10 with sterile nail nippers to decrease bulk/thickness and length.  She noted that she will use her own file at home to take more thickness down at her discretion.  The hyperkeratotic lesions were shaved with a sterile #313 blade x 3 lesions.  Salinocaine was applied to the left submet 5 lesions since it was more tender for her today.  She will remove the Band-Aid at bedtime.  Return in about 3 months (around 07/03/2023) for RFC.   Clerance Lav, DPM, FACFAS [facility]     [address] [place], [place] [address]                Office [phone]  Fax [phone]

## 2023-04-03 ENCOUNTER — Ambulatory Visit: Payer: Medicare Other | Admitting: Podiatry

## 2023-04-12 ENCOUNTER — Encounter: Payer: 59 | Admitting: Student

## 2023-04-18 ENCOUNTER — Encounter: Payer: 59 | Admitting: Student

## 2023-04-25 ENCOUNTER — Encounter: Payer: Self-pay | Admitting: Internal Medicine

## 2023-04-25 ENCOUNTER — Ambulatory Visit (INDEPENDENT_AMBULATORY_CARE_PROVIDER_SITE_OTHER): Payer: 59 | Admitting: Internal Medicine

## 2023-04-25 ENCOUNTER — Other Ambulatory Visit: Payer: Self-pay

## 2023-04-25 VITALS — BP 133/67 | HR 98 | Temp 98.3°F | Ht 67.0 in | Wt 171.7 lb

## 2023-04-25 DIAGNOSIS — R1010 Upper abdominal pain, unspecified: Secondary | ICD-10-CM

## 2023-04-25 DIAGNOSIS — E1142 Type 2 diabetes mellitus with diabetic polyneuropathy: Secondary | ICD-10-CM

## 2023-04-25 DIAGNOSIS — E1151 Type 2 diabetes mellitus with diabetic peripheral angiopathy without gangrene: Secondary | ICD-10-CM

## 2023-04-25 DIAGNOSIS — I739 Peripheral vascular disease, unspecified: Secondary | ICD-10-CM

## 2023-04-25 DIAGNOSIS — R109 Unspecified abdominal pain: Secondary | ICD-10-CM | POA: Diagnosis not present

## 2023-04-25 DIAGNOSIS — I10 Essential (primary) hypertension: Secondary | ICD-10-CM

## 2023-04-25 DIAGNOSIS — Z7984 Long term (current) use of oral hypoglycemic drugs: Secondary | ICD-10-CM

## 2023-04-25 DIAGNOSIS — Z7985 Long-term (current) use of injectable non-insulin antidiabetic drugs: Secondary | ICD-10-CM

## 2023-04-25 DIAGNOSIS — M5414 Radiculopathy, thoracic region: Secondary | ICD-10-CM

## 2023-04-25 DIAGNOSIS — Z716 Tobacco abuse counseling: Secondary | ICD-10-CM

## 2023-04-25 DIAGNOSIS — F17210 Nicotine dependence, cigarettes, uncomplicated: Secondary | ICD-10-CM

## 2023-04-25 LAB — GLUCOSE, CAPILLARY: Glucose-Capillary: 144 mg/dL — ABNORMAL HIGH (ref 70–99)

## 2023-04-25 LAB — POCT GLYCOSYLATED HEMOGLOBIN (HGB A1C): Hemoglobin A1C: 7 % — AB (ref 4.0–5.6)

## 2023-04-25 MED ORDER — GABAPENTIN 400 MG PO CAPS: 400.0000 mg | ORAL_CAPSULE | Freq: Three times a day (TID) | ORAL | 2 refills | Status: DC

## 2023-04-25 MED ORDER — DICLOFENAC SODIUM 1 % EX GEL: 2.0000 g | Freq: Every day | CUTANEOUS | 0 refills | Status: AC | PRN

## 2023-04-25 MED ORDER — NICOTINE 14 MG/24HR TD PT24: 14.0000 mg | MEDICATED_PATCH | TRANSDERMAL | 0 refills | Status: AC

## 2023-04-25 NOTE — Assessment & Plan Note (Signed)
Medications include Losartan-hctz 100-12.5 mg and amlodipine 5 mg. She has not taken her blood pressure medication today as she typically takes at lunch. Initial BP elevated at 140/80s, but repeat improved to 133/67. She does not check her blood pressure at home. P: Continue current medications.  Repeat BMP 05/25

## 2023-04-25 NOTE — Assessment & Plan Note (Signed)
ABI showed moderate right lower extremity PAD and mild left PAD. She is taking Asa 81 mg and Lipitor 40 mg. Last LDL <100. She continues to smoke about 5-6 cigarettes daily and is interested in cessation but does not want chantix. She would like to try nicotine patches. P: Referral to PT for exercise therapy Nicotine patch 14 mg daily Continue asa and lipitor

## 2023-04-25 NOTE — Assessment & Plan Note (Signed)
Left sided abdominal wall pain for last several months. She feels that area is swollen and tender to touch. She has electric pain that radiates to front of abdomen. Denies trauma to area. No swelling appreciated on exam. A: differentials include anterior nerve entrapment syndrome (she is not interested in another trigger point injection) or radiculopathy. Last MRI 06/2020 showed mild foraminal narrowing and leftward disc protrusion at T11-12. P: Seems neuropathic in nature. Discussed with her about increasing gabapentin to TID dosing as prescribed which she is open to. Gabapentin 400 mg TID

## 2023-04-25 NOTE — Progress Notes (Signed)
Subjective:  CC: pain on left abdomen and in legs  HPI:  Bridget Owens is a 67 y.o. female with a past medical history stated below and presents today for pain to left abdominal wall. This feels similar to pain she had on right side that resolved on its own after 2 years. Pain is a shooting pain that occurs intermittently. Wearing a bra makes pain worse and she has not found anything that makes it better. Her entire abdomen feels numb when she touches it.   She also has pain in her left mid-thigh that radiates into her foot. She describes pain as lightening sensation. She does not have associated back pain. She currently takes gabapentin at night before bed and feels that this does help with pain.  She is also having pain in right calf and thigh with ambulation. Pain resolves with rest. She is smoking about 5-6 cigarettes daily and is interested in cessation.  Please see problem based assessment and plan for additional details.  Past Medical History:  Diagnosis Date   Abdominal pain 03/14/2015   Adenomatous colon polyp 03/2008   Resected on colonoscopy, no high grade dysplasia.    Allergic rhinitis    At risk for polypharmacy 02/27/2018   Bell's palsy 01/2009   Left sided   Chest pain    Exercise stress test negative 6/07   Colon polyps    03/22/2008: adenomatous polyps x3 w no dysplasia. Needs repeat colonoscopy in 3 years   Diabetic peripheral neuropathy (HCC)    External hemorrhoid    GERD (gastroesophageal reflux disease)    Hyperlipidemia    Hypertension    Hypertensive retinopathy    Followed by Dr. Elmer Picker.   Insomnia    Intolerance, drug    Leg cramps on Lipitor   Left-sided back pain 03/22/2020   Lesion of labia 12/13/2021   Lipoma 09/2010   Posterior neck.    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 Clinical diagnosis.    Osteoarthritis    Carpometacarpal joint of right thumb   Osteoporosis screening 02/02/2020   Postmenopausal bleeding 06/2004   Endometrial biopsy showed  FOCAL TUBAL METAPLASIA    Postmenopausal symptoms    Hot flashes, vaginal dryness, iritability, difficulty sleeping. as of 2005.   Seasonal allergies 10/06/2014   Sebaceous cyst of breast    Has been refered to derm.   Tachycardia 07/13/2021   Trigger finger    Right thumb   Type II diabetes mellitus (HCC)    Uterine fibroid    Viral URI with cough 10/11/2017   Vulvovaginitis 02/02/2020    Current Outpatient Medications on File Prior to Visit  Medication Sig Dispense Refill   Accu-Chek FastClix Lancets MISC Check up to two times a day. 204 each 3   amLODipine (NORVASC) 5 MG tablet Take 1 tablet (5 mg total) by mouth daily. 30 tablet 11   aspirin 81 MG EC tablet Take 81 mg by mouth daily.   (Patient not taking: Reported on 06/19/2022)     atorvastatin (LIPITOR) 40 MG tablet TAKE 1 TABLET(40 MG) BY MOUTH DAILY 90 tablet 3   Blood Glucose Monitoring Suppl (ACCU-CHEK GUIDE) w/Device KIT USE AS DIRECTED 1 kit 0   cetirizine HCl (ZYRTEC) 5 MG/5ML SOLN Take 5 mLs (5 mg total) by mouth daily. 118 mL 3   fluticasone (FLONASE) 50 MCG/ACT nasal spray Place  2 sprays into both nostrils daily. 11.1 mL 3   glucose blood (ACCU-CHEK GUIDE) test strip CHECK BLOOD SUGAR UP TO TWICE DAILY 200 strip 3   lidocaine (LIDODERM) 5 % Place 1 patch onto the skin daily. Remove & Discard patch within 12 hours or as directed by MD 30 patch 0   losartan-hydrochlorothiazide (HYZAAR) 100-12.5 MG tablet Take 1 tablet by mouth daily. 90 tablet 3   metFORMIN (GLUCOPHAGE-XR) 500 MG 24 hr tablet Take 1 tablet (500 mg total) by mouth 2 (two) times daily with a meal. 180 tablet 3   Semaglutide,0.25 or 0.5MG /DOS, (OZEMPIC, 0.25 OR 0.5 MG/DOSE,) 2 MG/3ML SOPN Inject 0.25 mg into the skin once a week. 12 mL 3   Sodium Chloride-Sodium Bicarb (NETI POT SINUS WASH) 2300-700 MG  KIT Use to wash out sinuses twice daily as needed 1 kit 0   No current facility-administered medications on file prior to visit.    Family History  Problem Relation Age of Onset   Pneumonia Mother    Diabetes Mother    Early death Father    Diabetes Sister    Diabetes Maternal Grandmother    Diabetes Brother    Breast cancer Niece     Social History   Socioeconomic History   Marital status: Single    Spouse name: Not on file   Number of children: Not on file   Years of education: Not on file   Highest education level: Not on file  Occupational History   Not on file  Tobacco Use   Smoking status: Every Day    Current packs/day: 0.50    Average packs/day: 0.5 packs/day for 47.0 years (23.5 ttl pk-yrs)    Types: Cigarettes   Smokeless tobacco: Former    Quit date: 11/28/2010   Tobacco comments:    10-20 cigs daily  Vaping Use   Vaping status: Never Used  Substance and Sexual Activity   Alcohol use: No    Alcohol/week: 0.0 standard drinks of alcohol   Drug use: No   Sexual activity: Not on file  Other Topics Concern   Not on file  Social History Narrative   Not on file   Social Determinants of Health   Financial Resource Strain: Low Risk  (09/11/2022)   Overall Financial Resource Strain (CARDIA)    Difficulty of Paying Living Expenses: Not hard at all  Food Insecurity: Food Insecurity Present (12/06/2022)   Hunger Vital Sign    Worried About Running Out of Food in the Last Year: Sometimes true    Ran Out of Food in the Last Year: Sometimes true  Transportation Needs: Unmet Transportation Needs (12/06/2022)   PRAPARE - Administrator, Civil Service (Medical): Yes    Lack of Transportation (Non-Medical): No  Physical Activity: Insufficiently Active (09/11/2022)   Exercise Vital Sign    Days of Exercise per Week: 7 days    Minutes of Exercise per Session: 10 min  Stress: No Stress Concern Present (09/11/2022)   [employer] of Occupational Health -  Occupational Stress Questionnaire    Feeling of Stress : Not at all  Social Connections: Socially Isolated (12/06/2022)   Social Connection and Isolation Panel [NHANES]    Frequency of Communication with Friends and Family: More than three times a week    Frequency of Social Gatherings with Friends and Family: More than three times a week    Attends Religious Services: Never    Database administrator or Organizations: No  Attends Banker Meetings: Never    Marital Status: Never married  Intimate Partner Violence: Not At Risk (09/11/2022)   Humiliation, Afraid, Rape, and Kick questionnaire    Fear of Current or Ex-Partner: No    Emotionally Abused: No    Physically Abused: No    Sexually Abused: No    Review of Systems: ROS negative except for what is noted on the assessment and plan.  Objective:   Vitals:   04/25/23 0932 04/25/23 1029  BP: (!) 145/75 133/67  Pulse: 98   Temp: 98.3 F (36.8 C)   TempSrc: Oral   SpO2: 99%   Weight: 171 lb 11.2 oz (77.9 kg)   Height: 5\' 7"  (1.702 m)     Physical Exam: Constitutional: well-appearing  Cardiovascular: regular rate and rhythm, no m/r/g Pulmonary/Chest: normal work of breathing on room air, lungs clear to auscultation bilaterally Abdominal: pain with palpation of left thoracic/ abdominal wall, no ecchymosis or edema present MSK: normal bulk and tone Skin: warm and dry  Assessment & Plan:  PAD (peripheral artery disease) (HCC) ABI showed moderate right lower extremity PAD and mild left PAD. She is taking Asa 81 mg and Lipitor 40 mg. Last LDL <100. She continues to smoke about 5-6 cigarettes daily and is interested in cessation but does not want chantix. She would like to try nicotine patches. P: Referral to PT for exercise therapy Nicotine patch 14 mg daily Continue asa and lipitor  Abdominal wall pain Left sided abdominal wall pain for last several months. She feels that area is swollen and tender to touch.  She has electric pain that radiates to front of abdomen. Denies trauma to area. No swelling appreciated on exam. A: differentials include anterior nerve entrapment syndrome (she is not interested in another trigger point injection) or radiculopathy. Last MRI 06/2020 showed mild foraminal narrowing and leftward disc protrusion at T11-12. P: Seems neuropathic in nature. Discussed with her about increasing gabapentin to TID dosing as prescribed which she is open to. Gabapentin 400 mg TID  Essential hypertension Medications include Losartan-hctz 100-12.5 mg and amlodipine 5 mg. She has not taken her blood pressure medication today as she typically takes at lunch. Initial BP elevated at 140/80s, but repeat improved to 133/67. She does not check her blood pressure at home. P: Continue current medications.  Repeat BMP 05/25  Type 2 diabetes mellitus with peripheral neuropathy [facility]) Lab Results  Component Value Date   HGBA1C 9.0 (A) 01/23/2023   Lab Results  Component Value Date   HGBA1C 7.0 (A) 04/25/2023   P: A1c improved continue current medications as below: Semaglutide 0.25 mg weekly Metformin 500 mg bid    Patient discussed with Dr. Percell Boston Bridget Owens, D.O. [facility]  PGY-3 Pager: [phone]  Phone: [phone] Date 04/25/2023  Time 4:29 PM

## 2023-04-25 NOTE — Patient Instructions (Signed)
Thank you, Ms.Elta Guadeloupe for allowing [place] to provide your care today.   Right leg pain The studies done in June showed you have moderate decrease in blood flow. The best thing for this is to stop smoking, continue taking Aspirin and lipitor, and go to Physical Therapy for exercise therapy. I sent in nicotine patches, replace these daily.  Left leg pain Please take gabapentin 3 x daily. This sounds like nerve pain  Pain on left chest This also sounds like nerve pain, you can try using voltaren gel there and i think gabapentin will help too.   I have ordered the following labs for you:  Lab Orders         Glucose, capillary         POC Hbg A1C      Referrals ordered today:   Referral Orders         Ambulatory referral to Physical Therapy       I have ordered the following medication/changed the following medications:   Stop the following medications: Medications Discontinued During This Encounter  Medication Reason   clotrimazole (LOTRIMIN) 1 % cream    nystatin-triamcinolone ointment (MYCOLOG)    BISACODYL 5 MG EC tablet    gabapentin (NEURONTIN) 400 MG capsule Reorder   diclofenac Sodium (VOLTAREN) 1 % GEL Reorder     Start the following medications: Meds ordered this encounter  Medications   nicotine (NICODERM CQ - DOSED IN MG/24 HOURS) 14 mg/24hr patch    Sig: Place 1 patch (14 mg total) onto the skin daily.    Dispense:  30 patch    Refill:  0   gabapentin (NEURONTIN) 400 MG capsule    Sig: Take 1 capsule (400 mg total) by mouth 3 (three) times daily.    Dispense:  90 capsule    Refill:  2   diclofenac Sodium (VOLTAREN) 1 % GEL    Sig: Apply 2 g topically daily as needed.    Dispense:  2 g    Refill:  0     Follow up: 4 weeks   We look forward to seeing you next time. Please call our clinic at [phone] if you have any questions or concerns. The best time to call is Monday-Friday from 9am-4pm, but there is someone available 24/7. If after hours or the  weekend, call the main hospital number and ask for the Internal Medicine Resident On-Call. If you need medication refills, please notify your pharmacy one week in advance and they will send [place] a request.   Thank you for trusting me with your care. Wishing you the best!   Rudene Christians, DO [facility]

## 2023-04-25 NOTE — Assessment & Plan Note (Signed)
Lab Results  Component Value Date   HGBA1C 9.0 (A) 01/23/2023   Lab Results  Component Value Date   HGBA1C 7.0 (A) 04/25/2023   P: A1c improved continue current medications as below: Semaglutide 0.25 mg weekly Metformin 500 mg bid

## 2023-05-07 ENCOUNTER — Ambulatory Visit: Payer: 59 | Admitting: Podiatry

## 2023-05-08 NOTE — Progress Notes (Signed)
Internal Medicine Clinic Attending  Case discussed with the resident at the time of the visit.  We reviewed the resident's history and exam and pertinent patient test results.  I agree with the assessment, diagnosis, and plan of care documented in the resident's note.  Debe Coder, MD

## 2023-05-29 ENCOUNTER — Telehealth: Payer: Self-pay

## 2023-05-29 NOTE — Telephone Encounter (Signed)
Pt is requesting a call back ... She stated that she needs a letter to move into a ground floor apartment ...  She stated that  can't take the first appt we have which was 9/6 due to her [address] 8 is going to run out at the end of the month so she needs the letter before then

## 2023-05-31 ENCOUNTER — Telehealth: Payer: Self-pay | Admitting: Internal Medicine

## 2023-05-31 NOTE — Telephone Encounter (Signed)
Patient with known PAD and polyneuropathy calling in as she would like a letter to help her get her apartment on the ground floor. ABI showed moderate PAD in right leg and mild PAD in left. She was referred to exercise therapy and is prescribed asa and statin.  I think it is reasonable to write letter for housing. Letter signed and placed in front office basket

## 2023-06-20 ENCOUNTER — Telehealth: Payer: Self-pay | Admitting: *Deleted

## 2023-06-20 NOTE — Telephone Encounter (Signed)
Call from patient has had coughing and now sore throat.  Coughing up blood tinged mucous.  Ear Pain.  No nausea, vomiting, or diarrhea.  Has had since Sunday.  Has been taking Mucinex and Benadryl.. Will statrt to gargle with warm salt water.  Wants to know what else she can do.

## 2023-06-24 ENCOUNTER — Encounter: Payer: Self-pay | Admitting: Internal Medicine

## 2023-06-24 ENCOUNTER — Ambulatory Visit (INDEPENDENT_AMBULATORY_CARE_PROVIDER_SITE_OTHER): Payer: 59 | Admitting: Internal Medicine

## 2023-06-24 VITALS — BP 142/69 | HR 79 | Temp 97.7°F | Wt 175.9 lb

## 2023-06-24 DIAGNOSIS — Z0000 Encounter for general adult medical examination without abnormal findings: Secondary | ICD-10-CM

## 2023-06-24 DIAGNOSIS — J019 Acute sinusitis, unspecified: Secondary | ICD-10-CM

## 2023-06-24 DIAGNOSIS — E1142 Type 2 diabetes mellitus with diabetic polyneuropathy: Secondary | ICD-10-CM

## 2023-06-24 MED ORDER — CETIRIZINE HCL 10 MG PO TABS: 10.0000 mg | ORAL_TABLET | Freq: Every day | ORAL | 3 refills | Status: AC

## 2023-06-24 MED ORDER — BENZONATATE 100 MG PO CAPS: 200.0000 mg | ORAL_CAPSULE | Freq: Three times a day (TID) | ORAL | 0 refills | Status: AC | PRN

## 2023-06-24 NOTE — Progress Notes (Unsigned)
CC: URI symptoms  HPI:  [name] is a 67 y.o. with medical history of HTN, HLD, T2DM presenting to [facility] for acute complaint of URI symptoms.   Please see problem-based list for further details, assessments, and plans.  Past Medical History:  Diagnosis Date   Abdominal pain 03/14/2015   Adenomatous colon polyp 03/2008   Resected on colonoscopy, no high grade dysplasia.    Allergic rhinitis    At risk for polypharmacy 02/27/2018   Bell's palsy 01/2009   Left sided   Chest pain    Exercise stress test negative 6/07   Colon polyps    03/22/2008: adenomatous polyps x3 w no dysplasia. Needs repeat colonoscopy in 3 years   Diabetic peripheral neuropathy (HCC)    External hemorrhoid    GERD (gastroesophageal reflux disease)    Hyperlipidemia    Hypertension    Hypertensive retinopathy    Followed by Dr. Elmer Picker.   Insomnia    Intolerance, drug    Leg cramps on Lipitor   Left-sided back pain 03/22/2020   Lesion of labia 12/13/2021   Lipoma 09/2010   Posterior neck.    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 Clinical diagnosis.   Osteoarthritis    Carpometacarpal joint of right thumb   Osteoporosis screening 02/02/2020   Postmenopausal bleeding 06/2004   Endometrial biopsy showed  FOCAL TUBAL METAPLASIA    Postmenopausal symptoms    Hot flashes, vaginal dryness, iritability, difficulty sleeping. as of 2005.   Seasonal allergies 10/06/2014   Sebaceous cyst of breast    Has been refered to derm.   Tachycardia 07/13/2021   Trigger finger    Right thumb   Type II diabetes mellitus (HCC)    Uterine fibroid    Viral URI with cough 10/11/2017   Vulvovaginitis 02/02/2020    Current Outpatient Medications (Endocrine & Metabolic):    metFORMIN (GLUCOPHAGE-XR) 500 MG 24 hr tablet, Take 1 tablet (500 mg total) by mouth 2 (two) times daily with  a meal.   Semaglutide,0.25 or 0.5MG /DOS, (OZEMPIC, 0.25 OR 0.5 MG/DOSE,) 2 MG/3ML SOPN, Inject 0.25 mg into the skin once a week.  Current Outpatient Medications (Cardiovascular):    amLODipine (NORVASC) 5 MG tablet, Take 1 tablet (5 mg total) by mouth daily.   atorvastatin (LIPITOR) 40 MG tablet, TAKE 1 TABLET(40 MG) BY MOUTH DAILY   losartan-hydrochlorothiazide (HYZAAR) 100-12.5 MG tablet, Take 1 tablet by mouth daily.  Current Outpatient Medications (Respiratory):    cetirizine HCl (ZYRTEC) 5 MG/5ML SOLN, Take 5 mLs (5 mg total) by mouth daily.   fluticasone (FLONASE) 50 MCG/ACT nasal spray, Place 2 sprays into both nostrils daily.   Sodium Chloride-Sodium Bicarb (NETI POT SINUS WASH) 2300-700 MG KIT, Use to wash out sinuses twice daily as needed  Current Outpatient Medications (Analgesics):    aspirin 81 MG EC tablet, Take 81 mg by mouth daily.   (Patient not taking: Reported on 06/19/2022)   Current Outpatient Medications (Other):    Accu-Chek FastClix Lancets MISC, Check up to two times a day.   Blood Glucose Monitoring Suppl (ACCU-CHEK GUIDE) w/Device KIT, USE AS DIRECTED   diclofenac Sodium (VOLTAREN) 1 % GEL, Apply 2 g topically daily as needed.   gabapentin (NEURONTIN) 400 MG capsule, Take 1 capsule (400 mg total) by mouth 3 (three) times daily.   glucose blood (ACCU-CHEK GUIDE) test strip, CHECK BLOOD SUGAR  UP TO TWICE DAILY   lidocaine (LIDODERM) 5 %, Place 1 patch onto the skin daily. Remove & Discard patch within 12 hours or as directed by MD  Review of Systems:  Review of system negative unless stated in the problem list or HPI.    Physical Exam:  Vitals:   06/24/23 1349  BP: (!) 141/71  Pulse: 84  Temp: 97.7 F (36.5 C)  TempSrc: Oral  SpO2: 100%  Weight: 175 lb 14.4 oz (79.8 kg)   Physical Exam General: NAD HENT: NCAT Lungs: CTAB, no wheeze, rhonchi or rales.  Cardiovascular: Normal heart sounds, no r/m/g, 2+ pulses in all extremities. No LE edema Abdomen:  No TTP, normal bowel sounds MSK: No asymmetry or muscle atrophy.  Skin: no lesions noted on exposed skin Neuro: Alert and oriented x4. CN grossly intact Psych: Normal mood and normal affect   Assessment & Plan:   No problem-specific Assessment & Plan notes found for this encounter.   See Encounters Tab for problem based charting.  Patient Discussed with Dr. {NAMES:[id number]::"Guilloud","Hoffman","Mullen","Narendra","Vincent","Machen","Lau","Hatcher","Williams"} Gwenevere Abbot, MD Eligha Bridegroom. [facility] Internal Medicine Residency, PGY-3   Symptoms started last week.

## 2023-06-24 NOTE — Patient Instructions (Addendum)
[name], it was a pleasure seeing you today! You endorsed feeling well today. Below are some of the things we talked about this visit. We look forward to seeing you in the follow up appointment!  Today we discussed: Your symptoms appear to be mixed from a virus and allergies. They are improving so I would like you to continue current measures including flonase twice daily, zyrtec 10 mg daily, neti pot up to 3 times daily, and I will send in a cough suppressant.   I have ordered the following labs today:  Lab Orders  No laboratory test(s) ordered today      Referrals ordered today:   Referral Orders  No referral(s) requested today     I have ordered the following medication/changed the following medications:   Stop the following medications: There are no discontinued medications.   Start the following medications: No orders of the defined types were placed in this encounter.    Follow-up: 3-4 week follow up or earlier if needed   Please make sure to arrive 15 minutes prior to your next appointment. If you arrive late, you may be asked to reschedule.   We look forward to seeing you next time. Please call our clinic at [phone] if you have any questions or concerns. The best time to call is Monday-Friday from 9am-4pm, but there is someone available 24/7. If after hours or the weekend, call the main hospital number and ask for the Internal Medicine Resident On-Call. If you need medication refills, please notify your pharmacy one week in advance and they will send [place] a request.  Thank you for letting us take part in your care. Wishing you the best!  Thank you, Gwenevere Abbot, MD

## 2023-06-25 DIAGNOSIS — J019 Acute sinusitis, unspecified: Secondary | ICD-10-CM | POA: Insufficient documentation

## 2023-06-25 NOTE — Assessment & Plan Note (Signed)
Eye referral placed this visit.

## 2023-06-25 NOTE — Assessment & Plan Note (Signed)
Pt with complaint of sinus congestion, sore throat, bilateral ear pain, rhinorrhea, post nasal drip and cough that has been present for 8 days. She states her symptoms started on Sunday and were worsening until she started using flonase nasal spray. Ddx included viral vs bacterial vs allergic sinusitis. Viral sinusitis on top of her chronic allergic sinusitis is likely as it is improving with conservative measures. Pt with hx of sinusitis who was advised to use neti pot, zyrtec and flonase but pt states she does not use these. Will restart all of these interventions and give tessalon pearles for her coughing. Expect improvement in the coming days. Ear exam without signs of infection. If symptoms do not resolve by next week will consider treating pt for bacterial sinusitis. Current pt is without any fevers or chills. Return precautions given. Will call pt on Friday to see how she is feeling.

## 2023-07-03 NOTE — Progress Notes (Signed)
Internal Medicine Clinic Attending  Case discussed with the resident at the time of the visit.  We reviewed the resident's history and exam and pertinent patient test results.  I agree with the assessment, diagnosis, and plan of care documented in the resident's note.  Debe Coder, MD

## 2023-07-09 ENCOUNTER — Ambulatory Visit: Payer: 59 | Admitting: Podiatry

## 2023-07-22 ENCOUNTER — Ambulatory Visit (INDEPENDENT_AMBULATORY_CARE_PROVIDER_SITE_OTHER): Payer: 59 | Admitting: Podiatry

## 2023-07-22 ENCOUNTER — Encounter: Payer: Self-pay | Admitting: Podiatry

## 2023-07-22 VITALS — BP 160/76 | HR 79

## 2023-07-22 DIAGNOSIS — B351 Tinea unguium: Secondary | ICD-10-CM

## 2023-07-22 DIAGNOSIS — M79674 Pain in right toe(s): Secondary | ICD-10-CM

## 2023-07-22 DIAGNOSIS — L84 Corns and callosities: Secondary | ICD-10-CM

## 2023-07-22 DIAGNOSIS — M79675 Pain in left toe(s): Secondary | ICD-10-CM | POA: Diagnosis not present

## 2023-07-22 DIAGNOSIS — E1142 Type 2 diabetes mellitus with diabetic polyneuropathy: Secondary | ICD-10-CM

## 2023-07-22 NOTE — Progress Notes (Unsigned)
Subjective:  Patient ID: Bridget Owens, female    DOB: Jan 07, 1956,  MRN: [id number]  Bridget Owens presents to clinic today for:  Chief Complaint  Patient presents with   Diabetes    "Cut my nails and cut my calluses."   Patient notes nails are thick and elongated, causing pain in shoe gear when ambulating.  She also has painful calluses on the plantar aspect of both feet.  PCP is Masters, Florentina Addison, DO.  Date last seen was 04/25/2023.  Past Medical History:  Diagnosis Date   Abdominal pain 03/14/2015   Adenomatous colon polyp 03/2008   Resected on colonoscopy, no high grade dysplasia.    Allergic rhinitis    At risk for polypharmacy 02/27/2018   Bell's palsy 01/2009   Left sided   Chest pain    Exercise stress test negative 6/07   Colon polyps    03/22/2008: adenomatous polyps x3 w no dysplasia. Needs repeat colonoscopy in 3 years   Diabetic peripheral neuropathy (HCC)    External hemorrhoid    GERD (gastroesophageal reflux disease)    Hyperlipidemia    Hypertension    Hypertensive retinopathy    Followed by Dr. Elmer Picker.   Insomnia    Intolerance, drug    Leg cramps on Lipitor   Left-sided back pain 03/22/2020   Lesion of labia 12/13/2021   Lipoma 09/2010   Posterior neck.    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 Clinical diagnosis.   Osteoarthritis    Carpometacarpal joint of right thumb   Osteoporosis screening 02/02/2020   Postmenopausal bleeding 06/2004   Endometrial biopsy showed  FOCAL TUBAL METAPLASIA    Postmenopausal symptoms    Hot flashes, vaginal dryness, iritability, difficulty sleeping. as of 2005.   Seasonal allergies 10/06/2014   Sebaceous cyst of breast    Has been refered to derm.   Tachycardia 07/13/2021   Trigger finger    Right thumb   Type II diabetes mellitus (HCC)    Uterine fibroid    Viral URI with  cough 10/11/2017   Vulvovaginitis 02/02/2020    Allergies  Allergen Reactions   Codeine Other (See Comments)    "felt funny"    Objective:  Vitals:   07/22/23 1557  BP: (!) 160/76  Pulse: 79    Bridget Owens is a pleasant 67 y.o. female in NAD. AAO x 3.  Vascular Examination: Patient has palpable DP pulse, absent PT pulse bilateral.  Delayed capillary refill bilateral toes.  Sparse digital hair bilateral.  Proximal to distal cooling WNL bilateral.    Dermatological Examination: Interspaces are clear with no open lesions noted bilateral.  Skin is shiny and atrophic bilateral.  Nails are 3-54mm thick, with yellowish/brown discoloration, subungual debris and distal onycholysis x10.  There is pain with compression of nails x10.  There are hyperkeratotic lesions noted left submet 5, right submet 2 and 3.  Neurological Examination: Protective sensation intact b/l LE.      Latest Ref Rng & Units 04/25/2023    9:38 AM 01/23/2023    9:27 AM 10/11/2022   10:57 AM  Hemoglobin A1C  Hemoglobin-A1c 4.0 - 5.6 % 7.0  9.0  7.9    Patient qualifies for at-risk foot care because of diabetes with PVD.  Assessment/Plan: 1. Pain due to onychomycosis of toenails of both feet   2. Diabetic peripheral  neuropathy associated with type 2 diabetes mellitus (HCC)   3. Callus of foot    Mycotic nails x10 were sharply debrided with sterile nail nippers  to decrease bulk and length.  She does not want the nails burred today.  She states that she does this herself at home with a nail file.  Hyperkeratotic lesions x 3 were shaved with #312 blade.  Patient was given a prescription for shoes/orthotics for [employer].  She would benefit from having the preulcerative lesions offloaded with custom diabetic insoles.  Return in about 3 months (around 10/22/2023) for [facility].   Clerance Lav, DPM, FACFAS [facility]     [address] [place], [place] [address]                 Office [phone]  Fax [phone]

## 2023-08-04 ENCOUNTER — Other Ambulatory Visit: Payer: Self-pay | Admitting: Internal Medicine

## 2023-08-04 DIAGNOSIS — E118 Type 2 diabetes mellitus with unspecified complications: Secondary | ICD-10-CM

## 2023-08-05 NOTE — Telephone Encounter (Signed)
Next appt scheduled 11/07 with PCP.

## 2023-08-12 ENCOUNTER — Other Ambulatory Visit: Payer: Self-pay

## 2023-08-12 DIAGNOSIS — M5414 Radiculopathy, thoracic region: Secondary | ICD-10-CM

## 2023-08-12 MED ORDER — GABAPENTIN 400 MG PO CAPS: 400.0000 mg | ORAL_CAPSULE | Freq: Three times a day (TID) | ORAL | 2 refills | Status: DC

## 2023-08-12 NOTE — Telephone Encounter (Signed)
Patient called she is requesting a rx refill for Gabapentin, patient stated she is in the process of moving and has misplaced her medication.

## 2023-08-15 ENCOUNTER — Encounter: Payer: Self-pay | Admitting: Internal Medicine

## 2023-08-15 ENCOUNTER — Ambulatory Visit: Payer: 59 | Admitting: Internal Medicine

## 2023-08-15 VITALS — BP 136/69 | HR 86 | Temp 97.7°F | Ht 67.0 in | Wt 176.3 lb

## 2023-08-15 DIAGNOSIS — I739 Peripheral vascular disease, unspecified: Secondary | ICD-10-CM

## 2023-08-15 DIAGNOSIS — I10 Essential (primary) hypertension: Secondary | ICD-10-CM

## 2023-08-15 DIAGNOSIS — M79605 Pain in left leg: Secondary | ICD-10-CM

## 2023-08-15 DIAGNOSIS — E118 Type 2 diabetes mellitus with unspecified complications: Secondary | ICD-10-CM

## 2023-08-15 DIAGNOSIS — M79604 Pain in right leg: Secondary | ICD-10-CM | POA: Diagnosis not present

## 2023-08-15 DIAGNOSIS — E1140 Type 2 diabetes mellitus with diabetic neuropathy, unspecified: Secondary | ICD-10-CM | POA: Diagnosis not present

## 2023-08-15 DIAGNOSIS — G5793 Unspecified mononeuropathy of bilateral lower limbs: Secondary | ICD-10-CM

## 2023-08-15 DIAGNOSIS — E1142 Type 2 diabetes mellitus with diabetic polyneuropathy: Secondary | ICD-10-CM | POA: Diagnosis not present

## 2023-08-15 LAB — GLUCOSE, CAPILLARY: Glucose-Capillary: 136 mg/dL — ABNORMAL HIGH (ref 70–99)

## 2023-08-15 LAB — POCT GLYCOSYLATED HEMOGLOBIN (HGB A1C): Hemoglobin A1C: 7.1 % — AB (ref 4.0–5.6)

## 2023-08-15 MED ORDER — PREGABALIN 75 MG PO CAPS: 75.0000 mg | ORAL_CAPSULE | Freq: Three times a day (TID) | ORAL | 2 refills | Status: DC

## 2023-08-15 NOTE — Progress Notes (Signed)
Subjective:  CC: bilateral leg pain  HPI:  [name] is a 67 y.o. female with a past medical history diabetes with polyneuropathy and PAD who presents today for chronic bilateral leg pain. She has cramping pain in right calf that goes into lower right thigh, that worsens with walking and improves with rest. She also has pain in right> left foot that is cramping/ tingling pain that improves when she gets up and walks.  Please see problem based assessment and plan for additional details.  Past Medical History:  Diagnosis Date   Abdominal pain 03/14/2015   Adenomatous colon polyp 03/2008   Resected on colonoscopy, no high grade dysplasia.    Allergic rhinitis    At risk for polypharmacy 02/27/2018   Bell's palsy 01/2009   Left sided   Chest pain    Exercise stress test negative 6/07   Colon polyps    03/22/2008: adenomatous polyps x3 w no dysplasia. Needs repeat colonoscopy in 3 years   Diabetic peripheral neuropathy (HCC)    External hemorrhoid    GERD (gastroesophageal reflux disease)    Hyperlipidemia    Hypertension    Hypertensive retinopathy    Followed by Dr. Elmer Picker.   Insomnia    Intolerance, drug    Leg cramps on Lipitor   Left-sided back pain 03/22/2020   Lesion of labia 12/13/2021   Lipoma 09/2010   Posterior neck.    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 Clinical diagnosis.   Osteoarthritis    Carpometacarpal joint of right thumb   Osteoporosis screening 02/02/2020   Postmenopausal bleeding 06/2004   Endometrial biopsy showed  FOCAL TUBAL METAPLASIA    Postmenopausal symptoms    Hot flashes, vaginal dryness, iritability, difficulty sleeping. as of 2005.   Seasonal allergies 10/06/2014   Sebaceous cyst of breast    Has been refered to derm.   Tachycardia 07/13/2021   Trigger finger    Right thumb   Type II diabetes  mellitus (HCC)    Uterine fibroid    Viral URI with cough 10/11/2017   Vulvovaginitis 02/02/2020    Current Outpatient Medications on File Prior to Visit  Medication Sig Dispense Refill   Accu-Chek FastClix Lancets MISC Check up to two times a day. 204 each 3   amLODipine (NORVASC) 5 MG tablet Take 1 tablet (5 mg total) by mouth daily. 30 tablet 11   aspirin 81 MG EC tablet Take 81 mg by mouth daily.     atorvastatin (LIPITOR) 40 MG tablet TAKE 1 TABLET(40 MG) BY MOUTH DAILY 90 tablet 3   Blood Glucose Monitoring Suppl (ACCU-CHEK GUIDE) w/Device KIT USE AS DIRECTED 1 kit 0   cetirizine (ZYRTEC ALLERGY) 10 MG tablet Take 1 tablet (10 mg total) by mouth daily. 90 tablet 3   diclofenac Sodium (VOLTAREN) 1 % GEL Apply 2 g topically daily as needed. 2 g 0   fluticasone (FLONASE) 50 MCG/ACT nasal spray Place 2 sprays into both nostrils daily. 11.1 mL 3   glucose blood (ACCU-CHEK GUIDE) test strip USE TO CHECK BLOOD SUGAR UP TO TWICE DAILY 200 strip 3   lidocaine (LIDODERM) 5 % Place 1 patch onto the skin daily. Remove & Discard patch within 12 hours or as directed by MD 30 patch 0   losartan-hydrochlorothiazide (HYZAAR) 100-12.5 MG tablet Take 1 tablet by mouth daily. 90 tablet 3  metFORMIN (GLUCOPHAGE-XR) 500 MG 24 hr tablet Take 1 tablet (500 mg total) by mouth 2 (two) times daily with a meal. 180 tablet 3   Semaglutide,0.25 or 0.5MG /DOS, (OZEMPIC, 0.25 OR 0.5 MG/DOSE,) 2 MG/3ML SOPN Inject 0.25 mg into the skin once a week. 12 mL 3   Sodium Chloride-Sodium Bicarb (NETI POT SINUS WASH) 2300-700 MG KIT Use to wash out sinuses twice daily as needed 1 kit 0   No current facility-administered medications on file prior to visit.    Family History  Problem Relation Age of Onset   Pneumonia Mother    Diabetes Mother    Early death Father    Diabetes Sister    Diabetes Maternal Grandmother    Diabetes Brother    Breast cancer Niece     Social History   Socioeconomic History   Marital  status: Single    Spouse name: Not on file   Number of children: Not on file   Years of education: Not on file   Highest education level: Not on file  Occupational History   Not on file  Tobacco Use   Smoking status: Every Day    Current packs/day: 0.50    Average packs/day: 0.5 packs/day for 47.0 years (23.5 ttl pk-yrs)    Types: Cigarettes   Smokeless tobacco: Former    Quit date: 11/28/2010   Tobacco comments:    10-20 cigs daily  Vaping Use   Vaping status: Never Used  Substance and Sexual Activity   Alcohol use: No    Alcohol/week: 0.0 standard drinks of alcohol   Drug use: No   Sexual activity: Not on file  Other Topics Concern   Not on file  Social History Narrative   Not on file   Social Determinants of Health   Financial Resource Strain: Low Risk  (09/11/2022)   Overall Financial Resource Strain (CARDIA)    Difficulty of Paying Living Expenses: Not hard at all  Food Insecurity: Food Insecurity Present (12/06/2022)   Hunger Vital Sign    Worried About Running Out of Food in the Last Year: Sometimes true    Ran Out of Food in the Last Year: Sometimes true  Transportation Needs: Unmet Transportation Needs (12/06/2022)   PRAPARE - Administrator, Civil Service (Medical): Yes    Lack of Transportation (Non-Medical): No  Physical Activity: Insufficiently Active (09/11/2022)   Exercise Vital Sign    Days of Exercise per Week: 7 days    Minutes of Exercise per Session: 10 min  Stress: No Stress Concern Present (09/11/2022)   [employer] of Occupational Health - Occupational Stress Questionnaire    Feeling of Stress : Not at all  Social Connections: Socially Isolated (12/06/2022)   Social Connection and Isolation Panel [NHANES]    Frequency of Communication with Friends and Family: More than three times a week    Frequency of Social Gatherings with Friends and Family: More than three times a week    Attends Religious Services: Never    Doctor, general practice or Organizations: No    Attends Banker Meetings: Never    Marital Status: Never married  Intimate Partner Violence: Not At Risk (09/11/2022)   Humiliation, Afraid, Rape, and Kick questionnaire    Fear of Current or Ex-Partner: No    Emotionally Abused: No    Physically Abused: No    Sexually Abused: No    Review of Systems: ROS negative except for what is noted on  the assessment and plan.  Objective:   Vitals:   08/15/23 0952 08/15/23 1023  BP: (!) 148/72 136/69  Pulse: 93 86  Temp: 97.7 F (36.5 C)   TempSrc: Oral   SpO2: 97%   Weight: 176 lb 4.8 oz (80 kg)   Height: 5\' 7"  (1.702 m)     Physical Exam: Constitutional: well-appearing  Cardiovascular: regular rate and rhythm, no m/r/g Pulmonary/Chest: normal work of breathing on room air, lungs clear to auscultation bilaterally Neurological: 5/5 strength in bilateral upper and lower extremities, antalgic gait Skin: warm and dry  Assessment & Plan:  Leg pain, bilateral Patient with long standing bilateral lower extremity pain. There seems to be multiple types of pain happening. History consistent with both neuropathic sounding pain and claudication pain. She lost her gabapentin last week and thinks that her 1 year olf grandchild may have thrown it in the trash. She was not able to pick up gabapentin rx that was sent as insurance would not pay for early fill and she could not afford out of pocket cost. She was referred for exercise PT for claudication but did not make it to those appointments this summer. A: Differentials with neuropathic vs claudication pain. Prior MRI lumbar in 2022 showed no stenosis so neurogenic claudication unlikely. Could consider trial of cilostazol in this patient with potentially symptomatic PAD, however I have high concern for polypharmacy. P: Referral to PT for exercise therapy for PAD Discontinue gabapentin 400 mg TID and switch to lyrica 75 mg TID Asked patient to bring in all  medications at follow-up. P:  Essential hypertension She had rx for both losartan-hydrochlorothiazide 100-12.5 mg and losartan-hydrochlorothiazide 50-12.5 mg. She was not sure which she was supposed to be taking. She is unsure if she has amlodipine at home and if she is taking it.   BP Readings from Last 3 Encounters:  08/15/23 136/69  07/22/23 (!) 160/76  06/24/23 (!) 142/69   BP mildly elevated at 136/69 P: Continue losartan 100mg -hydrochlorothiazide 12.5mg  Asked patient to bring in all medications at follow-up in 4 weeks.   Patient discussed with Dr. Percell Boston Collis Thede, D.O. [facility]  PGY-[id number] Pager: [phone]  Phone: [phone] Date 08/16/2023  Time 4:18 PM

## 2023-08-15 NOTE — Patient Instructions (Addendum)
Thank you, Ms.Elta Guadeloupe for allowing [place] to provide your care today. BRING ALL YOUR MEDICATIONS IN AT FOLLOW-UP.  Leg pain I am worried your blood flow is causing pain in legs. I agree that physical therapy would be helpful. The type of pain you are having sounds like it could be from nerve pain versus narrowing of your blood vessels in your legs Blood vessels- SMOKING can make this type of pain worse I am rechecking cholesterol today as this can affect blood vessels Nerve pain- I have sent in lyrica, take this 3 times a day instead of gabapentin  Blood pressure Continue losartan-hydrochlorothiazide 100 mg -hydrochlorothiazide 12.5 mg Take amlodipine 5 mg  Diabetes A1c looked great today!  I have ordered the following labs for you:  Lab Orders         Glucose, capillary         Lipid Profile         POC Hbg A1C      Referrals ordered today:   Referral Orders         Ambulatory referral to Physical Therapy      I have ordered the following medication/changed the following medications:   Stop the following medications: Medications Discontinued During This Encounter  Medication Reason   gabapentin (NEURONTIN) 400 MG capsule      Start the following medications: Meds ordered this encounter  Medications   pregabalin (LYRICA) 75 MG capsule    Sig: Take 1 capsule (75 mg total) by mouth 3 (three) times daily.    Dispense:  90 capsule    Refill:  2     Follow up:  1 month    We look forward to seeing you next time. Please call our clinic at [phone] if you have any questions or concerns. The best time to call is Monday-Friday from 9am-4pm, but there is someone available 24/7. If after hours or the weekend, call the main hospital number and ask for the Internal Medicine Resident On-Call. If you need medication refills, please notify your pharmacy one week in advance and they will send [place] a request.   Thank you for trusting me with your care. Wishing you the best!    Rudene Christians, DO [facility]

## 2023-08-16 ENCOUNTER — Encounter: Payer: Self-pay | Admitting: Internal Medicine

## 2023-08-16 LAB — LIPID PANEL
Chol/HDL Ratio: 2.7 ratio (ref 0.0–4.4)
Cholesterol, Total: 139 mg/dL (ref 100–199)
HDL: 52 mg/dL (ref >39)
LDL Chol Calc (NIH): 71 mg/dL (ref 0–99)
Triglycerides: 86 mg/dL (ref 0–149)
VLDL Cholesterol Cal: 16 mg/dL (ref 5–40)

## 2023-08-16 NOTE — Assessment & Plan Note (Signed)
Patient with long standing bilateral lower extremity pain. There seems to be multiple types of pain happening. History consistent with both neuropathic sounding pain and claudication pain. She lost her gabapentin last week and thinks that her 1 year olf grandchild may have thrown it in the trash. She was not able to pick up gabapentin rx that was sent as insurance would not pay for early fill and she could not afford out of pocket cost. She was referred for exercise PT for claudication but did not make it to those appointments this summer. A: Differentials with neuropathic vs claudication pain. Prior MRI lumbar in 2022 showed no stenosis so neurogenic claudication unlikely. Could consider trial of cilostazol in this patient with potentially symptomatic PAD, however I have high concern for polypharmacy. P: Referral to PT for exercise therapy for PAD Discontinue gabapentin 400 mg TID and switch to lyrica 75 mg TID Asked patient to bring in all medications at follow-up. P:

## 2023-08-16 NOTE — Assessment & Plan Note (Signed)
She had rx for both losartan-hydrochlorothiazide 100-12.5 mg and losartan-hydrochlorothiazide 50-12.5 mg. She was not sure which she was supposed to be taking. She is unsure if she has amlodipine at home and if she is taking it.   BP Readings from Last 3 Encounters:  08/15/23 136/69  07/22/23 (!) 160/76  06/24/23 (!) 142/69   BP mildly elevated at 136/69 P: Continue losartan 100mg -hydrochlorothiazide 12.5mg  Asked patient to bring in all medications at follow-up in 4 weeks.

## 2023-08-20 NOTE — Progress Notes (Signed)
Internal Medicine Clinic Attending  Case discussed with the resident at the time of the visit.  We reviewed the resident's history and exam and pertinent patient test results.  I agree with the assessment, diagnosis, and plan of care documented in the resident's note.  Debe Coder, MD

## 2023-08-20 NOTE — Addendum Note (Signed)
Addended by: Debe Coder B on: 08/20/2023 09:26 AM   Modules accepted: Level of Service

## 2023-09-03 ENCOUNTER — Telehealth: Payer: Self-pay

## 2023-09-03 NOTE — Telephone Encounter (Signed)
Pt is requesting a call back .Marland Kitchen She stated she never got a call with her results from her last OV .Marland Kitchen She is also report bad headaches

## 2023-09-05 ENCOUNTER — Other Ambulatory Visit: Payer: Self-pay | Admitting: Student

## 2023-09-05 DIAGNOSIS — I10 Essential (primary) hypertension: Secondary | ICD-10-CM

## 2023-09-13 LAB — HM DIABETES EYE EXAM

## 2023-09-18 ENCOUNTER — Emergency Department (HOSPITAL_BASED_OUTPATIENT_CLINIC_OR_DEPARTMENT_OTHER)
Admission: EM | Admit: 2023-09-18 | Discharge: 2023-09-19 | Disposition: A | Discharge status: Home / Self Care | Payer: 59 | Attending: Emergency Medicine | Admitting: Emergency Medicine

## 2023-09-18 ENCOUNTER — Encounter (HOSPITAL_BASED_OUTPATIENT_CLINIC_OR_DEPARTMENT_OTHER): Payer: Self-pay

## 2023-09-18 ENCOUNTER — Other Ambulatory Visit: Payer: Self-pay

## 2023-09-18 ENCOUNTER — Emergency Department (HOSPITAL_BASED_OUTPATIENT_CLINIC_OR_DEPARTMENT_OTHER): Payer: 59

## 2023-09-18 DIAGNOSIS — Z7982 Long term (current) use of aspirin: Secondary | ICD-10-CM | POA: Insufficient documentation

## 2023-09-18 DIAGNOSIS — Z7984 Long term (current) use of oral hypoglycemic drugs: Secondary | ICD-10-CM | POA: Insufficient documentation

## 2023-09-18 DIAGNOSIS — I10 Essential (primary) hypertension: Secondary | ICD-10-CM | POA: Diagnosis not present

## 2023-09-18 DIAGNOSIS — R140 Abdominal distension (gaseous): Secondary | ICD-10-CM | POA: Insufficient documentation

## 2023-09-18 DIAGNOSIS — R1012 Left upper quadrant pain: Secondary | ICD-10-CM | POA: Diagnosis present

## 2023-09-18 DIAGNOSIS — E119 Type 2 diabetes mellitus without complications: Secondary | ICD-10-CM | POA: Diagnosis not present

## 2023-09-18 DIAGNOSIS — Z79899 Other long term (current) drug therapy: Secondary | ICD-10-CM | POA: Diagnosis not present

## 2023-09-18 LAB — URINALYSIS, ROUTINE W REFLEX MICROSCOPIC
Bilirubin Urine: NEGATIVE
Glucose, UA: NEGATIVE mg/dL
Hgb urine dipstick: NEGATIVE
Ketones, ur: NEGATIVE mg/dL
Nitrite: NEGATIVE
Protein, ur: NEGATIVE mg/dL
Specific Gravity, Urine: 1.011 (ref 1.005–1.030)
pH: 6.5 (ref 5.0–8.0)

## 2023-09-18 LAB — CBC WITH DIFFERENTIAL/PLATELET
Abs Immature Granulocytes: 0.02 10*3/uL (ref 0.00–0.07)
Basophils Absolute: 0 10*3/uL (ref 0.0–0.1)
Basophils Relative: 1 %
Eosinophils Absolute: 0.1 10*3/uL (ref 0.0–0.5)
Eosinophils Relative: 1 %
HCT: 41.2 % (ref 36.0–46.0)
Hemoglobin: 13.6 g/dL (ref 12.0–15.0)
Immature Granulocytes: 0 %
Lymphocytes Relative: 27 %
Lymphs Abs: 2.1 10*3/uL (ref 0.7–4.0)
MCH: 28.8 pg (ref 26.0–34.0)
MCHC: 33 g/dL (ref 30.0–36.0)
MCV: 87.3 fL (ref 80.0–100.0)
Monocytes Absolute: 0.5 10*3/uL (ref 0.1–1.0)
Monocytes Relative: 6 %
Neutro Abs: 5 10*3/uL (ref 1.7–7.7)
Neutrophils Relative %: 65 %
Platelets: 309 10*3/uL (ref 150–400)
RBC: 4.72 MIL/uL (ref 3.87–5.11)
RDW: 12.8 % (ref 11.5–15.5)
WBC: 7.7 10*3/uL (ref 4.0–10.5)
nRBC: 0 % (ref 0.0–0.2)

## 2023-09-18 MED ORDER — SODIUM CHLORIDE 0.9 % IV BOLUS
1000.0000 mL | Freq: Once | INTRAVENOUS | Status: AC
  Administered 2023-09-18: 1000 mL via INTRAVENOUS

## 2023-09-18 NOTE — ED Triage Notes (Signed)
Patient presents to ED reporting swelling to abdomen under left chest for two years that comes and goes. Patient reports it is painful, denies pain to chest or back, and denies SOB.

## 2023-09-18 NOTE — ED Provider Notes (Signed)
Bridget Owens EMERGENCY DEPARTMENT AT [facility] Provider Note   CSN: [id number] Arrival date & time: 09/18/23  1943     History  Chief Complaint  Patient presents with   Swelling    Bridget Owens is a 67 y.o. female.  Patient is a 67 year old female with past medical history of type 2 diabetes, hypertension, hyperlipidemia, GERD.  Patient presenting today for evaluation of abdominal discomfort and swelling.  She tells me this has been ongoing intermittently for the past 2 years.  She describes pain and swelling to the left upper quadrant unrelated to eating or drinking.  No fevers or chills.  She denies any bowel or bladder complaints.  No aggravating or alleviating factors.  The history is provided by the patient.       Home Medications Prior to Admission medications   Medication Sig Start Date End Date Taking? Authorizing Provider  Accu-Chek FastClix Lancets MISC Check up to two times a day. 11/05/19   Angelita Ingles, MD  amLODipine (NORVASC) 5 MG tablet Take 1 tablet (5 mg total) by mouth daily. 06/27/22 06/27/23  Gwenevere Abbot, MD  aspirin 81 MG EC tablet Take 81 mg by mouth daily.    [provider]  atorvastatin (LIPITOR) 40 MG tablet TAKE 1 TABLET(40 MG) BY MOUTH DAILY 10/11/22   Marolyn Haller, MD  Blood Glucose Monitoring Suppl (ACCU-CHEK GUIDE) w/Device KIT USE AS DIRECTED 10/28/18   Angelita Ingles, MD  cetirizine (ZYRTEC ALLERGY) 10 MG tablet Take 1 tablet (10 mg total) by mouth daily. 06/24/23 06/23/24  Gwenevere Abbot, MD  diclofenac Sodium (VOLTAREN) 1 % GEL Apply 2 g topically daily as needed. 04/25/23   Masters, Katie, DO  fluticasone (FLONASE) 50 MCG/ACT nasal spray Place 2 sprays into both nostrils daily. 02/21/23 02/16/24  Quincy Simmonds, MD  glucose blood (ACCU-CHEK GUIDE) test strip USE TO CHECK BLOOD SUGAR UP TO TWICE DAILY 08/05/23   Masters, Florentina Addison, DO  lidocaine (LIDODERM) 5 % Place 1 patch onto the skin daily. Remove & Discard patch within 12  hours or as directed by MD 08/01/22   Masters, Florentina Addison, DO  losartan-hydrochlorothiazide (HYZAAR) 100-12.5 MG tablet Take 1 tablet by mouth daily. 02/21/23   Quincy Simmonds, MD  metFORMIN (GLUCOPHAGE-XR) 500 MG 24 hr tablet Take 1 tablet (500 mg total) by mouth 2 (two) times daily with a meal. 10/11/22 10/11/23  Marolyn Haller, MD  pregabalin (LYRICA) 75 MG capsule Take 1 capsule (75 mg total) by mouth 3 (three) times daily. 08/15/23 08/14/24  Masters, Katie, DO  Semaglutide,0.25 or 0.5MG /DOS, (OZEMPIC, 0.25 OR 0.5 MG/DOSE,) 2 MG/3ML SOPN Inject 0.25 mg into the skin once a week. 02/21/23   Quincy Simmonds, MD  Sodium Chloride-Sodium Bicarb (NETI POT SINUS WASH) 2300-700 MG KIT Use to wash out sinuses twice daily as needed 02/16/21   Remo Lipps, MD      Allergies    Codeine    Review of Systems   Review of Systems  All other systems reviewed and are negative.   Physical Exam Updated Vital Signs BP (!) 167/90 (BP Location: Right Arm)   Pulse 87   Temp 97.7 F (36.5 C)   Resp (!) 21   Ht 5\' 6"  (1.676 m)   Wt 78.9 kg   SpO2 98%   BMI 28.08 kg/m  Physical Exam Vitals and nursing note reviewed.  Constitutional:      General: She is not in acute distress.    Appearance: She is well-developed. She  is not diaphoretic.  HENT:     Head: Normocephalic and atraumatic.  Cardiovascular:     Rate and Rhythm: Normal rate and regular rhythm.     Heart sounds: No murmur heard.    No friction rub. No gallop.  Pulmonary:     Effort: Pulmonary effort is normal. No respiratory distress.     Breath sounds: Normal breath sounds. No wheezing.  Abdominal:     General: Bowel sounds are normal. There is no distension.     Palpations: Abdomen is soft.     Tenderness: There is abdominal tenderness. There is no guarding or rebound.     Comments: There is tenderness to palpation in the left upper quadrant.  There is no palpable abnormality.  Musculoskeletal:        General: Normal range of motion.      Cervical back: Normal range of motion and neck supple.  Skin:    General: Skin is warm and dry.  Neurological:     General: No focal deficit present.     Mental Status: She is alert and oriented to person, place, and time.     ED Results / Procedures / Treatments   Labs (all labs ordered are listed, but only abnormal results are displayed) Labs Reviewed  COMPREHENSIVE METABOLIC PANEL  CBC WITH DIFFERENTIAL/PLATELET  LIPASE, BLOOD  URINALYSIS, ROUTINE W REFLEX MICROSCOPIC    EKG None  Radiology No results found.  Procedures Procedures    Medications Ordered in ED Medications  sodium chloride 0.9 % bolus 1,000 mL (has no administration in time range)    ED Course/ Medical Decision Making/ A&P  Patient is a 67 year old female presenting with complaints of abdominal discomfort and bloating occurring intermittently over the past 2 years.  Patient arrives here with stable vital signs and is afebrile.  Physical examination reveals tenderness to the left upper quadrant, but no rebound or guarding.  Workup initiated including CBC, CMP, and lipase, all of which are unremarkable.  Urinalysis is clear.  CT scan with renal protocol obtained showing no evidence of acute intra-abdominal process.  I am uncertain as to the etiology of this patient's symptoms, but they appear more chronic in nature and nothing today appears acute.  Patient will be discharged with follow-up with primary doctor if not improving.  Final Clinical Impression(s) / ED Diagnoses Final diagnoses:  None    Rx / DC Orders ED Discharge Orders     None         Geoffery Lyons, MD 09/19/23 [phone]

## 2023-09-19 LAB — COMPREHENSIVE METABOLIC PANEL
ALT: 20 U/L (ref 0–44)
AST: 16 U/L (ref 15–41)
Albumin: 4.5 g/dL (ref 3.5–5.0)
Alkaline Phosphatase: 71 U/L (ref 38–126)
Anion gap: 9 (ref 5–15)
BUN: 13 mg/dL (ref 8–23)
CO2: 29 mmol/L (ref 22–32)
Calcium: 10.4 mg/dL — ABNORMAL HIGH (ref 8.9–10.3)
Chloride: 100 mmol/L (ref 98–111)
Creatinine, Ser: 0.64 mg/dL (ref 0.44–1.00)
GFR, Estimated: >60 mL/min (ref >60)
Glucose, Bld: 178 mg/dL — ABNORMAL HIGH (ref 70–99)
Potassium: 3.7 mmol/L (ref 3.5–5.1)
Sodium: 138 mmol/L (ref 135–145)
Total Bilirubin: 0.3 mg/dL (ref <1.2)
Total Protein: 8 g/dL (ref 6.5–8.1)

## 2023-09-19 LAB — LIPASE, BLOOD: Lipase: 39 U/L (ref 11–51)

## 2023-09-19 NOTE — Discharge Instructions (Signed)
Follow-up with your primary doctor in the next week, and return to the ER if you develop severe abdominal pain, high fevers, bloody stools, or for other new and concerning symptoms.

## 2023-09-26 ENCOUNTER — Encounter: Payer: 59 | Admitting: Student

## 2023-09-26 NOTE — Progress Notes (Deleted)
67 year old female with history of HTN, PAD, T2DM with neuropathy, and pulmonary fibrosis 08/15/2023-clinic visit with referral to PT for exercise therapy to help PAD claudication, switched gabapentin to Lyrica Told to bring medications next visit 09/18/2023-ED visit for chronic abdominal pain and bloating, unremarkable labs, CT scan

## 2023-10-17 ENCOUNTER — Encounter: Payer: 59 | Admitting: Student

## 2023-10-22 ENCOUNTER — Ambulatory Visit: Payer: 59 | Admitting: Student

## 2023-10-22 ENCOUNTER — Encounter: Payer: Self-pay | Admitting: Student

## 2023-10-22 ENCOUNTER — Telehealth: Payer: Self-pay

## 2023-10-22 DIAGNOSIS — R197 Diarrhea, unspecified: Secondary | ICD-10-CM

## 2023-10-22 NOTE — Progress Notes (Signed)
 CC: Loose stools  This is a telephone encounter between Bridget Owens and Bridget Owens on 10/22/2023 for loose stools. The visit was conducted with the patient located at home and Bridget Owens at [facility]. The patient's identity was confirmed using their DOB and current address. The patient has consented to being evaluated through a telephone encounter and understands the associated risks (an examination cannot be done and the patient may need to come in for an appointment) / benefits (allows the patient to remain at home, decreasing exposure to coronavirus). I personally spent 15 minutes on medical discussion.   HPI:  Bridget Owens is a 68 y.o. with PMH as below.   Please see A&P for assessment of the patient's acute and chronic medical conditions.   Past Medical History:  Diagnosis Date   Abdominal pain 03/14/2015   Adenomatous colon polyp 03/2008   Resected on colonoscopy, no high grade dysplasia.    Allergic rhinitis    At risk for polypharmacy 02/27/2018   Bell's palsy 01/2009   Left sided   Chest pain    Exercise stress test negative 6/07   Colon polyps    03/22/2008: adenomatous polyps x3 w no dysplasia. Needs repeat colonoscopy in 3 years   Diabetic peripheral neuropathy (HCC)    External hemorrhoid    GERD (gastroesophageal reflux disease)    Hyperlipidemia    Hypertension    Hypertensive retinopathy    Followed by Dr. Cleatus.   Insomnia    Intolerance, drug    Leg cramps on Lipitor   Left-sided back pain 03/22/2020   Lesion of labia 12/13/2021   Lipoma 09/2010   Posterior neck.    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 Clinical diagnosis.   Osteoarthritis    Carpometacarpal joint of right thumb   Osteoporosis screening 02/02/2020   Postmenopausal bleeding 06/2004   Endometrial biopsy showed  FOCAL TUBAL METAPLASIA    Postmenopausal  symptoms    Hot flashes, vaginal dryness, iritability, difficulty sleeping. as of 2005.   Seasonal allergies 10/06/2014   Sebaceous cyst of breast    Has been refered to derm.   Tachycardia 07/13/2021   Trigger finger    Right thumb   Type II diabetes mellitus (HCC)    Uterine fibroid    Viral URI with cough 10/11/2017   Vulvovaginitis 02/02/2020   Review of Systems:    GI: Patient endorses loose stools  Assessment & Plan:   Diarrhea Patient endorses 3-day history of nonbloody diarrhea.  Patient states that she has been going to the bathroom 4 times daily which is out of her norm.  She states that she has been eating and drinking well but has no appetite.  She states that there is no one else in the house that is sick.  She denies any fevers reports having some chills.  She denies any upper respiratory infection symptoms.  No recent traveling.  She only drinks bottled water, with no exposure to well water.  She denies any uncooked or raw foods that she may have consumed causing this.  She states nothing is made it better but also has not tried anything.  She states she does have grandkids at the house, but have not been the school.  She denies any recent medicine changes. She denies any abdominal pain. No recent antibiotic use.  Plan: -Supportive care with Imodium -Encourage hydration -  If patient at any point has any concerns for being unable to keep down any liquids and solids, she needs to call back to be seen -If does not improve in the next 3-4 days, will need to call back to be seen as well   Patient discussed with Dr. Jerrell Bridget Blanch, DO  Internal Medicine Resident

## 2023-10-22 NOTE — Telephone Encounter (Signed)
 RTC to patient has had Diarrhea for since Sunday. Has about 3 to 4 times a day.  Lose and watery.  No abdominal pain of fevers.  Does have some Nausea with.  Is not taking anything presently.  Unable to get transportation for an appointment.  Given a TeleHealth appointment  for this afternoon.

## 2023-10-22 NOTE — Assessment & Plan Note (Addendum)
 Patient endorses 3-day history of nonbloody diarrhea.  Patient states that she has been going to the bathroom 4 times daily which is out of her norm.  She states that she has been eating and drinking well but has no appetite.  She states that there is no one else in the house that is sick.  She denies any fevers reports having some chills.  She denies any upper respiratory infection symptoms.  No recent traveling.  She only drinks bottled water, with no exposure to well water.  She denies any uncooked or raw foods that she may have consumed causing this.  She states nothing is made it better but also has not tried anything.  She states she does have grandkids at the house, but have not been the school.  She denies any recent medicine changes. She denies any abdominal pain. No recent antibiotic use.  Plan: -Supportive care with Imodium -Encourage hydration -If patient at any point has any concerns for being unable to keep down any liquids and solids, she needs to call back to be seen -If does not improve in the next 3-4 days, will need to call back to be seen as well

## 2023-10-22 NOTE — Telephone Encounter (Signed)
 Please see telehealth visit thank you.

## 2023-10-22 NOTE — Telephone Encounter (Signed)
 Requesting to speak with a nurse, stating she has diarrhea since Sunday. Wanting to know what should she do. No open slot for appt. Please call pt back.

## 2023-10-23 ENCOUNTER — Encounter: Payer: Self-pay | Admitting: Student

## 2023-10-24 NOTE — Progress Notes (Signed)
 Internal Medicine Clinic Attending  Case discussed with the resident physician at the time of the visit.  We reviewed the patient's history, exam, and pertinent patient test results.  I agree with the assessment, diagnosis, and plan of care documented in the resident's note.

## 2023-10-28 ENCOUNTER — Encounter: Payer: Self-pay | Admitting: Podiatry

## 2023-10-28 ENCOUNTER — Ambulatory Visit (INDEPENDENT_AMBULATORY_CARE_PROVIDER_SITE_OTHER): Payer: 59 | Admitting: Podiatry

## 2023-10-28 VITALS — Ht 66.0 in

## 2023-10-28 DIAGNOSIS — L84 Corns and callosities: Secondary | ICD-10-CM | POA: Diagnosis not present

## 2023-10-28 DIAGNOSIS — B351 Tinea unguium: Secondary | ICD-10-CM | POA: Diagnosis not present

## 2023-10-28 DIAGNOSIS — M79674 Pain in right toe(s): Secondary | ICD-10-CM | POA: Diagnosis not present

## 2023-10-28 DIAGNOSIS — E1151 Type 2 diabetes mellitus with diabetic peripheral angiopathy without gangrene: Secondary | ICD-10-CM | POA: Diagnosis not present

## 2023-10-28 DIAGNOSIS — M79675 Pain in left toe(s): Secondary | ICD-10-CM

## 2023-10-28 NOTE — Progress Notes (Signed)
Subjective:  Patient ID: Bridget Owens, female    DOB: 09/25/1956,  MRN: [id number]  Bridget Owens presents to clinic today for:  Chief Complaint  Patient presents with   DFc    She is here for a nail trim and callous trim as well, she is diabetic and does not remember her last A1C was last  I see in epic was 7.1.    Patient notes nails are thick and elongated, causing pain in shoe gear when ambulating.  She also has painful plantar calluses and corns.  She notes her autistic granddaughter at home likes to file her toenails after they returned after visits.  She does not want them sanded today.  PCP is Masters, Florentina Addison, DO.  Last seen 08/15/2023  Past Medical History:  Diagnosis Date   Abdominal pain 03/14/2015   Adenomatous colon polyp 03/2008   Resected on colonoscopy, no high grade dysplasia.    Allergic rhinitis    At risk for polypharmacy 02/27/2018   Bell's palsy 01/2009   Left sided   Chest pain    Exercise stress test negative 6/07   Colon polyps    03/22/2008: adenomatous polyps x3 w no dysplasia. Needs repeat colonoscopy in 3 years   Diabetic peripheral neuropathy (HCC)    External hemorrhoid    GERD (gastroesophageal reflux disease)    Hyperlipidemia    Hypertension    Hypertensive retinopathy    Followed by Dr. Elmer Picker.   Insomnia    Intolerance, drug    Leg cramps on Lipitor   Left-sided back pain 03/22/2020   Lesion of labia 12/13/2021   Lipoma 09/2010   Posterior neck.    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 Clinical diagnosis.   Osteoarthritis    Carpometacarpal joint of right thumb   Osteoporosis screening 02/02/2020   Postmenopausal bleeding 06/2004   Endometrial biopsy showed  FOCAL TUBAL METAPLASIA    Postmenopausal symptoms    Hot flashes, vaginal dryness, iritability, difficulty sleeping. as of 2005.   Seasonal  allergies 10/06/2014   Sebaceous cyst of breast    Has been refered to derm.   Tachycardia 07/13/2021   Trigger finger    Right thumb   Type II diabetes mellitus (HCC)    Uterine fibroid    Viral URI with cough 10/11/2017   Vulvovaginitis 02/02/2020    Allergies  Allergen Reactions   Codeine Other (See Comments)    "felt funny"   Objective:  Bridget Owens is a pleasant 68 y.o. female in NAD. AAO x 3.  Vascular Examination: Patient has palpable DP pulse, absent PT pulse bilateral.  Delayed capillary refill bilateral toes.  Sparse digital hair bilateral.  Proximal to distal cooling WNL bilateral.    Dermatological Examination: Interspaces are clear with no open lesions noted bilateral.  Skin is shiny and atrophic bilateral.  Nails are 3-70mm thick, with yellowish/brown discoloration, subungual debris and distal onycholysis x10.  There is pain with compression of nails x10.  There are hyperkeratotic lesions noted right submet 1 and bilateral submet 5.     Latest Ref Rng & Units 08/15/2023    9:48 AM 04/25/2023    9:38 AM 01/23/2023    9:27 AM  Hemoglobin A1C  Hemoglobin-A1c 4.0 - 5.6 % 7.1  7.0  9.0    Patient qualifies for at-risk foot care because of diabetes with PVD.  Assessment/Plan: 1. Pain due to onychomycosis of toenails of both feet   2. Callus of foot   3. Type II diabetes mellitus with peripheral circulatory disorder (HCC)    Mycotic nails x10 were sharply debrided with sterile nail nippers to decrease bulk and length.  Hyperkeratotic lesions x3, all located on the ball of the the foot (not the toes) were shaved with #312 blade.  Recommend RevitaDerm 40 cream for the calluses nightly.  Return in about 3 months (around 01/26/2024) for [facility].   Clerance Lav, DPM, FACFAS [facility]     [address] [place], [place] [address]                Office [phone]  Fax [phone]

## 2023-10-28 NOTE — Telephone Encounter (Signed)
 error

## 2023-10-29 ENCOUNTER — Telehealth: Payer: Self-pay | Admitting: *Deleted

## 2023-10-29 NOTE — Telephone Encounter (Signed)
Call from pt requesting a refill on pain medication. Pt states she does not remember the name but it's not Gabapentin. Pt is on Lyrica - states she had called the pharmacy and was told no more refills. I called [facility] who stated pt has 2 refills left. They will have it ready for pt this afternoon after 4PM. I called pt back to let her know refill will be ready this afternoon; pt very appreciative.

## 2023-10-31 ENCOUNTER — Ambulatory Visit: Payer: 59 | Admitting: Student

## 2023-10-31 ENCOUNTER — Encounter: Payer: Self-pay | Admitting: Student

## 2023-10-31 VITALS — BP 160/90 | HR 93 | Temp 98.3°F | Ht 66.0 in | Wt 180.0 lb

## 2023-10-31 DIAGNOSIS — G8929 Other chronic pain: Secondary | ICD-10-CM

## 2023-10-31 DIAGNOSIS — R109 Unspecified abdominal pain: Secondary | ICD-10-CM

## 2023-10-31 DIAGNOSIS — G5793 Unspecified mononeuropathy of bilateral lower limbs: Secondary | ICD-10-CM

## 2023-10-31 DIAGNOSIS — R1012 Left upper quadrant pain: Secondary | ICD-10-CM | POA: Diagnosis not present

## 2023-10-31 MED ORDER — PREGABALIN 100 MG PO CAPS: 100.0000 mg | ORAL_CAPSULE | Freq: Three times a day (TID) | ORAL | 2 refills | Status: AC

## 2023-10-31 NOTE — Patient Instructions (Signed)
Thank you, Ms.Elta Guadeloupe for allowing [place] to provide your care today.   Referral Orders         Ambulatory referral to Gastroenterology      I have ordered the following medication/changed the following medications:   Stop the following medications: Medications Discontinued During This Encounter  Medication Reason   pregabalin (LYRICA) 75 MG capsule      Start the following medications: Meds ordered this encounter  Medications   pregabalin (LYRICA) 100 MG capsule    Sig: Take 1 capsule (100 mg total) by mouth 3 (three) times daily.    Dispense:  90 capsule    Refill:  2      Follow up:  1 month  for diabetes visit     We look forward to seeing you next time. Please call our clinic at [phone] if you have any questions or concerns. The best time to call is Monday-Friday from 9am-4pm, but there is someone available 24/7. If after hours or the weekend, call the main hospital number and ask for the Internal Medicine Resident On-Call. If you need medication refills, please notify your pharmacy one week in advance and they will send [place] a request.   Thank you for trusting me with your care. Wishing you the best!  Lovie Macadamia MD [facility] Center

## 2023-10-31 NOTE — Progress Notes (Addendum)
Subjective:  CC: Left upper quadrant abdominal pain  HPI:  [name] is a 68 y.o. person with a past medical history stated below and presents today for the stated chief complaint. Please see problem based assessment and plan for additional details.  Past Medical History:  Diagnosis Date   Abdominal pain 03/14/2015   Adenomatous colon polyp 03/2008   Resected on colonoscopy, no high grade dysplasia.    Allergic rhinitis    At risk for polypharmacy 02/27/2018   Bell's palsy 01/2009   Left sided   Chest pain    Exercise stress test negative 6/07   Colon polyps    03/22/2008: adenomatous polyps x3 w no dysplasia. Needs repeat colonoscopy in 3 years   Diabetic peripheral neuropathy (HCC)    External hemorrhoid    GERD (gastroesophageal reflux disease)    Hyperlipidemia    Hypertension    Hypertensive retinopathy    Followed by Dr. Elmer Picker.   Insomnia    Intolerance, drug    Leg cramps on Lipitor   Left-sided back pain 03/22/2020   Lesion of labia 12/13/2021   Lipoma 09/2010   Posterior neck.    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 Clinical diagnosis.   Osteoarthritis    Carpometacarpal joint of right thumb   Osteoporosis screening 02/02/2020   Postmenopausal bleeding 06/2004   Endometrial biopsy showed  FOCAL TUBAL METAPLASIA    Postmenopausal symptoms    Hot flashes, vaginal dryness, iritability, difficulty sleeping. as of 2005.   Seasonal allergies 10/06/2014   Sebaceous cyst of breast    Has been refered to derm.   Tachycardia 07/13/2021   Trigger finger    Right thumb   Type II diabetes mellitus (HCC)    Uterine fibroid    Viral URI with cough 10/11/2017   Vulvovaginitis 02/02/2020    Current Outpatient Medications on File Prior to Visit  Medication Sig Dispense Refill   Accu-Chek FastClix Lancets MISC Check up to  two times a day. 204 each 3   amLODipine (NORVASC) 5 MG tablet Take 1 tablet (5 mg total) by mouth daily. 30 tablet 11   aspirin 81 MG EC tablet Take 81 mg by mouth daily.     atorvastatin (LIPITOR) 40 MG tablet TAKE 1 TABLET(40 MG) BY MOUTH DAILY 90 tablet 3   Blood Glucose Monitoring Suppl (ACCU-CHEK GUIDE) w/Device KIT USE AS DIRECTED 1 kit 0   cetirizine (ZYRTEC ALLERGY) 10 MG tablet Take 1 tablet (10 mg total) by mouth daily. 90 tablet 3   diclofenac Sodium (VOLTAREN) 1 % GEL Apply 2 g topically daily as needed. 2 g 0   fluticasone (FLONASE) 50 MCG/ACT nasal spray Place 2 sprays into both nostrils daily. 11.1 mL 3   glucose blood (ACCU-CHEK GUIDE) test strip USE TO CHECK BLOOD SUGAR UP TO TWICE DAILY 200 strip 3   lidocaine (LIDODERM) 5 % Place 1 patch onto the skin daily. Remove & Discard patch within 12 hours or as directed by MD 30 patch 0   losartan-hydrochlorothiazide (HYZAAR) 100-12.5 MG tablet Take 1 tablet by mouth daily. 90 tablet 3   metFORMIN (GLUCOPHAGE-XR) 500 MG 24 hr tablet Take 1 tablet (500 mg total) by mouth 2 (two) times daily with a meal. 180 tablet 3   Semaglutide,0.25 or 0.5MG /DOS, (OZEMPIC, 0.25 OR 0.5 MG/DOSE,) 2 MG/3ML SOPN Inject 0.25 mg into the skin  once a week. 12 mL 3   Sodium Chloride-Sodium Bicarb (NETI POT SINUS WASH) 2300-700 MG KIT Use to wash out sinuses twice daily as needed 1 kit 0   No current facility-administered medications on file prior to visit.    Review of Systems: Please see assessment and plan for pertinent positives and negatives.  Objective:   Vitals:   10/31/23 0903  BP: (!) 160/90  Pulse: 93  Temp: 98.3 F (36.8 C)  TempSrc: Oral  SpO2: 98%  Weight: 180 lb (81.6 kg)  Height: 5\' 6"  (1.676 m)    Physical Exam: Constitutional: Well-appearing Cardiovascular: Regular rate and rhythm.  2 out of 6 systolic murmur best heard at the upper sternal border. Pulmonary/Chest: lungs clear to auscultation bilaterally Abdominal:  Tenderness to palpation of the left upper quadrant.  No rebound, or guarding.  Negative Carnett's sign. Extremities: No edema of the lower extremities bilaterally Psych: Pleasant affect  Assessment & Plan:  LUQ pain This is a 68 year old female presenting with left upper quadrant pain which has been chronic for her for over a year.  She has had multiple workups for this pain, without clear etiology.  She states the pain is sharp, sometimes like a lightening sensation.  It is not worse with movement, but is extremely tender to palpation.  No rash that she has noticed, but sometimes associated with itching.  She states that she has discontinued taking her Ozempic because she feels like it may make the pain worse, but also describes symptoms that sound like acid reflux following Ozempic administration.  She did have a bout of acute diarrhea which she had a telephone visit for about 1 week ago, but the patient states that this is resolved.  She otherwise denies ongoing diarrhea, nausea, vomiting, melena, hematochezia, or hematemesis.  She has 1 formed brown stool daily.  The last colonoscopy I can see for the patient is in 2013, but the patient states she recently had a colonoscopy done.  She states she had a similar right upper quadrant pain in the past, but that resolved but has since begun on the left side.  There has been some discussion about whether this could be a neuropathic pain, and the patient does state that Lyrica helps the pain.  She is currently on 75 mg 3 times daily.  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  Given these findings, and the association with her symptoms with Ozempic, I wonder if there is some aspect of visceral abdominal pain.  Plan: Increase Lyrica 75 mg 3 times daily to 100 mg 3 times daily. PDMP reviewed and appropriate GI referral for evaluation    Patient discussed with Dr.  Julian Reil MD [facility] Internal Medicine  PGY-1 Pager: [phone]  Phone: [phone] Date 10/31/2023  Time 3:23 PM

## 2023-10-31 NOTE — Assessment & Plan Note (Signed)
This is a 68 year old female presenting with left upper quadrant pain which has been chronic for her for over a year.  She has had multiple workups for this pain, without clear etiology.  She states the pain is sharp, sometimes like a lightening sensation.  It is not worse with movement, but is extremely tender to palpation.  No rash that she has noticed, but sometimes associated with itching.  She states that she has discontinued taking her Ozempic because she feels like it may make the pain worse, but also describes symptoms that sound like acid reflux following Ozempic administration.  She did have a bout of acute diarrhea which she had a telephone visit for about 1 week ago, but the patient states that this is resolved.  She otherwise denies ongoing diarrhea, nausea, vomiting, melena, hematochezia, or hematemesis.  She has 1 formed brown stool daily.  The last colonoscopy I can see for the patient is in 2013, but the patient states she recently had a colonoscopy done.  She states she had a similar right upper quadrant pain in the past, but that resolved but has since begun on the left side.  There has been some discussion about whether this could be a neuropathic pain, and the patient does state that Lyrica helps the pain.  She is currently on 75 mg 3 times daily.  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  Given these findings, and the association with her symptoms with Ozempic, I wonder if there is some aspect of visceral abdominal pain.  Plan: Increase Lyrica 75 mg 3 times daily to 100 mg 3 times daily. PDMP reviewed and appropriate GI referral for evaluation

## 2023-11-01 NOTE — Progress Notes (Signed)
Internal Medicine Clinic Attending  Case discussed with the resident at the time of the visit.  We reviewed the resident's history and exam and pertinent patient test results.  I agree with the assessment, diagnosis, and plan of care documented in the resident's note.

## 2023-11-13 ENCOUNTER — Other Ambulatory Visit: Payer: Self-pay

## 2023-11-13 DIAGNOSIS — E1142 Type 2 diabetes mellitus with diabetic polyneuropathy: Secondary | ICD-10-CM

## 2023-11-13 MED ORDER — METFORMIN HCL ER 500 MG PO TB24: 500.0000 mg | ORAL_TABLET | Freq: Two times a day (BID) | ORAL | 3 refills | Status: AC

## 2023-11-13 NOTE — Telephone Encounter (Signed)
 Medication sent to pharmacy

## 2023-11-14 ENCOUNTER — Encounter: Payer: 59 | Admitting: Student

## 2023-12-24 ENCOUNTER — Other Ambulatory Visit: Payer: Self-pay | Admitting: Nurse Practitioner

## 2023-12-24 DIAGNOSIS — R109 Unspecified abdominal pain: Secondary | ICD-10-CM

## 2023-12-25 ENCOUNTER — Other Ambulatory Visit: Payer: Self-pay

## 2023-12-25 ENCOUNTER — Other Ambulatory Visit: Payer: Self-pay | Admitting: Nurse Practitioner

## 2023-12-25 DIAGNOSIS — R921 Mammographic calcification found on diagnostic imaging of breast: Secondary | ICD-10-CM

## 2023-12-31 ENCOUNTER — Ambulatory Visit
Admission: RE | Admit: 2023-12-31 | Discharge: 2023-12-31 | Disposition: A | Discharge status: Home / Self Care | Source: Ambulatory Visit | Attending: Nurse Practitioner

## 2023-12-31 DIAGNOSIS — R109 Unspecified abdominal pain: Secondary | ICD-10-CM

## 2024-01-14 ENCOUNTER — Other Ambulatory Visit: Payer: Self-pay | Admitting: Nurse Practitioner

## 2024-01-14 DIAGNOSIS — N644 Mastodynia: Secondary | ICD-10-CM

## 2024-01-15 ENCOUNTER — Encounter

## 2024-01-15 ENCOUNTER — Other Ambulatory Visit: Payer: Self-pay | Admitting: Nurse Practitioner

## 2024-01-15 ENCOUNTER — Ambulatory Visit: Admission: RE | Admit: 2024-01-15 | Source: Ambulatory Visit

## 2024-01-15 ENCOUNTER — Ambulatory Visit
Admission: RE | Admit: 2024-01-15 | Discharge: 2024-01-15 | Discharge status: Home / Self Care | Source: Ambulatory Visit | Attending: Nurse Practitioner

## 2024-01-15 DIAGNOSIS — N644 Mastodynia: Secondary | ICD-10-CM

## 2024-01-23 ENCOUNTER — Ambulatory Visit: Admitting: Podiatry

## 2024-01-27 ENCOUNTER — Ambulatory Visit: Payer: 59 | Admitting: Podiatry

## 2024-01-30 ENCOUNTER — Encounter: Payer: Self-pay | Admitting: Podiatry

## 2024-01-30 ENCOUNTER — Ambulatory Visit (INDEPENDENT_AMBULATORY_CARE_PROVIDER_SITE_OTHER): Admitting: Podiatry

## 2024-01-30 DIAGNOSIS — M79674 Pain in right toe(s): Secondary | ICD-10-CM

## 2024-01-30 DIAGNOSIS — B351 Tinea unguium: Secondary | ICD-10-CM

## 2024-01-30 DIAGNOSIS — E1151 Type 2 diabetes mellitus with diabetic peripheral angiopathy without gangrene: Secondary | ICD-10-CM | POA: Diagnosis not present

## 2024-01-30 DIAGNOSIS — L84 Corns and callosities: Secondary | ICD-10-CM

## 2024-01-30 DIAGNOSIS — M79675 Pain in left toe(s): Secondary | ICD-10-CM

## 2024-01-30 DIAGNOSIS — Q828 Other specified congenital malformations of skin: Secondary | ICD-10-CM | POA: Diagnosis not present

## 2024-01-30 NOTE — Progress Notes (Signed)
 This patient returns to my office for at risk foot care.  This patient requires this care by a professional since this patient will be at risk due to having diabetes.   This patient is unable to cut nails herself since the patient cannot reach her nails.These nails are painful walking and wearing shoes. She has painful forefoot callus   This patient presents for at risk foot care today.  General Appearance  Alert, conversant and in no acute stress.  Vascular  Dorsalis pedis and posterior tibial  pulses are palpable  bilaterally.  Capillary return is within normal limits  bilaterally. Temperature is within normal limits  bilaterally.  Neurologic  Senn-Weinstein monofilament wire test within normal limits  bilaterally. Muscle power within normal limits bilaterally.  Nails Thick disfigured discolored nails with subungual debris  from hallux to fifth toes bilaterally. No evidence of bacterial infection or drainage bilaterally.  Orthopedic  No limitations of motion  feet .  No crepitus or effusions noted.  No bony pathology or digital deformities noted.  Skin  normotropic skin with no porokeratosis noted bilaterally.  No signs of infections or ulcers noted.   Callus sub 1st MPJ right and sub 5th left foot.    Onychomycosis  Pain in right toes  Pain in left toes  Consent was obtained for treatment procedures.   Mechanical debridement of nails 1-5  bilaterally performed with a nail nipper.  the dremel tool was not used on this patient.  Callus was debrided with # 15 blade only.   Return office visit    3 months                  Told patient to return for periodic foot care and evaluation due to potential at risk complications.   Ruffin Cotton DPM

## 2024-03-03 ENCOUNTER — Ambulatory Visit
Admission: RE | Admit: 2024-03-03 | Discharge: 2024-03-03 | Disposition: A | Discharge status: Home / Self Care | Source: Ambulatory Visit | Attending: Nurse Practitioner

## 2024-03-03 ENCOUNTER — Other Ambulatory Visit: Payer: Self-pay | Admitting: Nurse Practitioner

## 2024-03-03 DIAGNOSIS — M25511 Pain in right shoulder: Secondary | ICD-10-CM

## 2024-03-06 ENCOUNTER — Ambulatory Visit

## 2024-03-06 ENCOUNTER — Encounter

## 2024-03-11 ENCOUNTER — Ambulatory Visit
Admission: RE | Admit: 2024-03-11 | Discharge: 2024-03-11 | Disposition: A | Discharge status: Home / Self Care | Source: Ambulatory Visit | Attending: Nurse Practitioner

## 2024-03-11 DIAGNOSIS — R921 Mammographic calcification found on diagnostic imaging of breast: Secondary | ICD-10-CM

## 2024-03-20 ENCOUNTER — Ambulatory Visit
Admission: RE | Admit: 2024-03-20 | Discharge: 2024-03-20 | Disposition: A | Discharge status: Home / Self Care | Source: Ambulatory Visit | Attending: Nurse Practitioner | Admitting: Nurse Practitioner

## 2024-03-24 ENCOUNTER — Telehealth: Payer: Self-pay | Admitting: Podiatry

## 2024-03-24 NOTE — Telephone Encounter (Signed)
 The patient reports that her calluses are very painful, making it hard to walk. The last [facility] was on 4/24, and her next appointment is scheduled for 7/24. She states that she cannot wait that long due to the pain. The patient mentions that both are usually done at the same time. However, due to insurance restrictions, the [facility] cannot be done currently.   She wants to know what can she do/cover the callus with in the meantime

## 2024-04-23 ENCOUNTER — Encounter: Payer: Self-pay | Admitting: Podiatry

## 2024-04-23 ENCOUNTER — Other Ambulatory Visit

## 2024-04-29 ENCOUNTER — Ambulatory Visit (INDEPENDENT_AMBULATORY_CARE_PROVIDER_SITE_OTHER): Admitting: Podiatry

## 2024-04-29 DIAGNOSIS — Z91198 Patient's noncompliance with other medical treatment and regimen for other reason: Secondary | ICD-10-CM

## 2024-04-30 ENCOUNTER — Ambulatory Visit: Admitting: Podiatry

## 2024-05-03 NOTE — Progress Notes (Signed)
 1. Failure to attend appointment with reason given    Rescheduled by patient.

## 2024-05-05 ENCOUNTER — Encounter: Payer: Self-pay | Admitting: Podiatry

## 2024-05-05 ENCOUNTER — Ambulatory Visit (INDEPENDENT_AMBULATORY_CARE_PROVIDER_SITE_OTHER): Admitting: Podiatry

## 2024-05-05 DIAGNOSIS — M79675 Pain in left toe(s): Secondary | ICD-10-CM

## 2024-05-05 DIAGNOSIS — E119 Type 2 diabetes mellitus without complications: Secondary | ICD-10-CM

## 2024-05-05 DIAGNOSIS — M2041 Other hammer toe(s) (acquired), right foot: Secondary | ICD-10-CM

## 2024-05-05 DIAGNOSIS — M2042 Other hammer toe(s) (acquired), left foot: Secondary | ICD-10-CM

## 2024-05-05 DIAGNOSIS — M79674 Pain in right toe(s): Secondary | ICD-10-CM

## 2024-05-05 DIAGNOSIS — E1142 Type 2 diabetes mellitus with diabetic polyneuropathy: Secondary | ICD-10-CM

## 2024-05-05 DIAGNOSIS — B351 Tinea unguium: Secondary | ICD-10-CM | POA: Diagnosis not present

## 2024-05-05 DIAGNOSIS — Q828 Other specified congenital malformations of skin: Secondary | ICD-10-CM

## 2024-05-07 NOTE — Progress Notes (Signed)
 ANNUAL DIABETIC FOOT EXAM  Subjective: Bridget Owens presents today for annual diabetic foot exam. She is accompanied by her daughter on today's visit. Chief Complaint  Patient presents with   Rfc    Rm15/ Diabetic foot care/blood sugar 150/A1c 8.9 Dr. Arloa June 2025   Patient confirms h/o diabetes.  Patient denies any h/o foot wounds.  Patient has been diagnosed with neuropathy.  Arloa Jarvis, NP is patient's PCP.  Past Medical History:  Diagnosis Date   Abdominal pain 03/14/2015   Adenomatous colon polyp 03/2008   Resected on colonoscopy, no high grade dysplasia.    Allergic rhinitis    At risk for polypharmacy 02/27/2018   Bell's palsy 01/2009   Left sided   Chest pain    Exercise stress test negative 6/07   Colon polyps    03/22/2008: adenomatous polyps x3 w no dysplasia. Needs repeat colonoscopy in 3 years   Diabetic peripheral neuropathy (HCC)    External hemorrhoid    GERD (gastroesophageal reflux disease)    Hyperlipidemia    Hypertension    Hypertensive retinopathy    Followed by Dr. Cleatus.   Insomnia    Intolerance, drug    Leg cramps on Lipitor   Left-sided back pain 03/22/2020   Lesion of labia 12/13/2021   Lipoma 09/2010   Posterior neck.    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 Clinical diagnosis.   Osteoarthritis    Carpometacarpal joint of right thumb   Osteoporosis screening 02/02/2020   Postmenopausal bleeding 06/2004   Endometrial biopsy showed  FOCAL TUBAL METAPLASIA    Postmenopausal symptoms    Hot flashes, vaginal dryness, iritability, difficulty sleeping. as of 2005.   Seasonal allergies 10/06/2014   Sebaceous cyst of breast    Has been refered to derm.   Tachycardia 07/13/2021   Trigger finger    Right thumb   Type II diabetes mellitus (HCC)    Uterine fibroid    Viral URI with cough  10/11/2017   Vulvovaginitis 02/02/2020   Patient Active Problem List   Diagnosis Date Noted   Diarrhea 10/22/2023   Abdominal wall pain 04/25/2023   PAD (peripheral artery disease) (HCC) 02/22/2023   Vaginal pruritus 12/26/2022   Neuropathy, thoracic (radicular) 12/07/2022   Left axillary pain 04/12/2022   Intertrigo 02/28/2022   Abdominal tenderness, epigastric 10/17/2021   Aortic atherosclerosis (HCC) 08/29/2021   Left ankle pain 08/29/2021   Loss of appetite 07/13/2021   Leg pain, bilateral 06/19/2021   Left knee pain 05/21/2021   Mononeuropathy of left lower extremity 04/14/2021   Mass of upper inner quadrant of left breast 10/10/2020   Breast pain, left 09/26/2020   Non-scarring hair loss 07/27/2019   Hypertrophic toenail 09/05/2017   Pulmonary fibrosis, unspecified (HCC) 09/05/2016   LUQ pain 08/07/2016   Notalgia paresthetica, resolved 01/09/2016   Mixed incontinence urge and stress 12/21/2014   Neuropathy, lower extremity 12/21/2014   Ear pain, bilateral 05/30/2014   Generalized pruritus 02/05/2014   Cigarette smoker 04/03/2013   Health care maintenance 02/03/2013   Brain lipoma, 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 204 each 3   amLODipine  (NORVASC ) 5 MG tablet Take 1 tablet (5 mg total) by mouth daily. 30 tablet 11   aspirin 81 MG EC tablet Take 81 mg by mouth daily.     atorvastatin  (LIPITOR) 40 MG tablet TAKE 1 TABLET(40 MG) BY MOUTH DAILY 90 tablet 3   Blood Glucose Monitoring Suppl (ACCU-CHEK GUIDE)  w/Device KIT USE AS DIRECTED 1 kit 0   cetirizine  (ZYRTEC  ALLERGY) 10 MG tablet Take 1 tablet (10 mg total) by mouth daily. 90 tablet 3   diclofenac  Sodium (VOLTAREN ) 1 % GEL Apply 2 g topically daily as needed. 2 g 0   fluticasone  (FLONASE ) 50 MCG/ACT nasal spray Place 2 sprays into both nostrils daily. 11.1 mL 3   glucose blood (ACCU-CHEK GUIDE) test strip USE TO CHECK BLOOD SUGAR UP TO TWICE DAILY 200 strip 3   lidocaine  (LIDODERM ) 5 % Place 1 patch onto the skin daily. Remove & Discard patch within 12 hours or as directed by MD 30 patch 0   losartan -hydrochlorothiazide  (HYZAAR ) 100-12.5 MG tablet Take 1 tablet by mouth daily. 90 tablet 3   metFORMIN  (GLUCOPHAGE -XR) 500 MG 24 hr tablet Take 1 tablet (500 mg total) by mouth 2 (two) times daily with a meal. 180 tablet 3   pregabalin  (LYRICA ) 100 MG capsule Take 1 capsule (100 mg total) by mouth 3 (three) times daily. 90 capsule 2   Semaglutide ,0.25 or 0.5MG /DOS, (OZEMPIC , 0.25 OR 0.5 MG/DOSE,) 2 MG/3ML SOPN Inject 0.25 mg into the skin once a week. 12 mL 3   Sodium Chloride -Sodium Bicarb (NETI POT SINUS WASH) 2300-700 MG KIT Use to wash out sinuses twice daily as needed 1 kit 0   No current facility-administered medications on file prior to visit.    Allergies  Allergen Reactions   Codeine  Other (See Comments)    felt funny   Social History   Occupational History   Not on file  Tobacco Use   Smoking status: Every Day    Current packs/day: 0.50    Average packs/day: 0.5 packs/day for 47.0 years (23.5 ttl pk-yrs)    Types: Cigarettes   Smokeless tobacco: Former    Quit date: 11/28/2010   Tobacco comments:    10-20 cigs daily  Vaping Use   Vaping status: Never Used  Substance and Sexual Activity   Alcohol use: No    Alcohol/week: 0.0 standard drinks of alcohol   Drug use: No   Sexual activity: Not on file   Family History  Problem Relation Age of Onset   Pneumonia Mother    Diabetes Mother    Early death Father    Diabetes  Sister    Diabetes Maternal Grandmother    Diabetes Brother    Breast cancer Niece    Immunization History  Administered Date(s) Administered   Td 05/18/2009     Review of Systems: Negative except as noted in the HPI.   Objective: There were no vitals filed for this visit.  Bridget Owens is a pleasant 68 y.o. female in NAD. AAO X 3.  Diabetic foot exam was performed with the following findings:   Vascular Examination: Capillary refill time immediate b/l. Vascular status intact b/l with palpable pedal pulses. Pedal hair present b/l. No pain with calf compression b/l. Skin temperature gradient WNL b/l. No cyanosis or clubbing b/l. No  ischemia or gangrene noted b/l. No edema noted b/l LE.  Neurological Examination: Sensation grossly intact b/l with 10 gram monofilament. Vibratory sensation intact b/l. Pt has subjective symptoms of neuropathy.  Dermatological Examination: Pedal skin with normal turgor, texture and tone b/l.  No open wounds. No interdigital macerations.   Toenails 1-5 b/l thick, discolored, elongated with subungual debris and pain on dorsal palpation.   Porokeratotic lesion(s) submet head 1 right foot, submet head 2 right foot, and submet head 5 b/l. No erythema, no edema, no drainage, no fluctuance.  Musculoskeletal Examination: Muscle strength 5/5 to all lower extremity muscle groups bilaterally. Tailor's bunion deformity noted b/l LE.SABRAHammertoes 2-5 b/l.  No pain, crepitus or joint limitation noted with ROM b/l LE.  Patient ambulates independently without assistive aids.  Radiographs: None     Lab Results  Component Value Date   HGBA1C 7.1 (A) 08/15/2023   ADA Risk Categorization: Low Risk :  Patient has all of the following: Intact protective sensation No prior foot ulcer  No severe deformity Pedal pulses present  Assessment: 1. Pain due to onychomycosis of toenails of both feet   2. Porokeratosis   3. Acquired hammertoes of both feet   4. Diabetic  peripheral neuropathy associated with type 2 diabetes mellitus (HCC)   5. Encounter for diabetic foot exam (HCC)     Plan: Orders Placed This Encounter  Procedures   For home use only DME Other see comment    To [facility]:  Dispense one pair extra depth shoes and 3 pair custom insoles.  Offload calluses submet heads 1, 2, 5 right foot and submet head 5 left foot.    Length of Need:   12 Months  Patient was evaluated and treated. All patient's and/or POA's questions/concerns addressed on today's visit. Toenails 1-5 debrided in length and girth without incident. Porokeratotic lesion(s) submet head 1 right foot, submet head 2 right foot, and submet head 5 b/l pared with sharp debridement without incident. Continue soft, supportive shoe gear daily. Report any pedal injuries to medical professional. Call office if there are any questions/concerns. -Patient/POA to call should there be question/concern in the interim. Return in about 3 months (around 08/05/2024).  Delon LITTIE Merlin, DPM      [place] LOCATION: [address] [place] [address]                   Office [phone]   [facility] LOCATION: [address] [place], [place] [address] Office [phone]

## 2024-08-14 ENCOUNTER — Other Ambulatory Visit (HOSPITAL_BASED_OUTPATIENT_CLINIC_OR_DEPARTMENT_OTHER): Payer: Self-pay | Admitting: Nurse Practitioner

## 2024-08-14 DIAGNOSIS — Z780 Asymptomatic menopausal state: Secondary | ICD-10-CM

## 2024-08-19 ENCOUNTER — Encounter: Payer: Self-pay | Admitting: Podiatry

## 2024-08-19 ENCOUNTER — Ambulatory Visit: Admitting: Podiatry

## 2024-08-19 DIAGNOSIS — B351 Tinea unguium: Secondary | ICD-10-CM | POA: Diagnosis not present

## 2024-08-19 DIAGNOSIS — M2042 Other hammer toe(s) (acquired), left foot: Secondary | ICD-10-CM

## 2024-08-19 DIAGNOSIS — M79674 Pain in right toe(s): Secondary | ICD-10-CM | POA: Diagnosis not present

## 2024-08-19 DIAGNOSIS — M2041 Other hammer toe(s) (acquired), right foot: Secondary | ICD-10-CM | POA: Diagnosis not present

## 2024-08-19 DIAGNOSIS — M21622 Bunionette of left foot: Secondary | ICD-10-CM

## 2024-08-19 DIAGNOSIS — E1142 Type 2 diabetes mellitus with diabetic polyneuropathy: Secondary | ICD-10-CM

## 2024-08-19 DIAGNOSIS — M79675 Pain in left toe(s): Secondary | ICD-10-CM | POA: Diagnosis not present

## 2024-08-19 DIAGNOSIS — M21621 Bunionette of right foot: Secondary | ICD-10-CM

## 2024-08-19 DIAGNOSIS — L84 Corns and callosities: Secondary | ICD-10-CM

## 2024-08-21 ENCOUNTER — Encounter: Admitting: Dietician

## 2024-08-29 ENCOUNTER — Encounter: Payer: Self-pay | Admitting: Podiatry

## 2024-08-29 NOTE — Progress Notes (Signed)
 Subjective:  Patient ID: Bridget Owens, female    DOB: October 06, 1956,  MRN: [id number]  Bridget Owens presents to clinic today for at risk foot care with history of diabetic neuropathy and callus(es) of both feet and painful mycotic toenails that are difficult to trim. Painful toenails interfere with ambulation. Aggravating factors include wearing enclosed shoe gear. Pain is relieved with periodic professional debridement. Painful calluses are aggravated when weightbearing with and without shoegear. Pain is relieved with periodic professional debridement. Her daughter is present during today's visit. Patient states she never heard from [employer] [facility] about her diabetic shoes. Chief Complaint  Patient presents with   Diabetes    DFC IDDM A1C 7.6. Toenail trim and callus care. LOV with PCP   New problem(s): None.   PCP is Arloa Jarvis, NP.  Allergies  Allergen Reactions   Codeine  Other (See Comments)    felt funny    Review of Systems: Negative except as noted in the HPI.  Objective:  There were no vitals filed for this visit. Bridget Owens is a pleasant 68 y.o. female in NAD. AAO x 3.  Vascular Examination: Capillary refill time immediate b/l. Vascular status intact b/l with palpable pedal pulses. Pedal hair present b/l. No pain with calf compression b/l. Skin temperature gradient WNL b/l. No cyanosis or clubbing b/l. No ischemia or gangrene noted b/l. No edema noted b/l LE.  Neurological Examination: Sensation grossly intact b/l with 10 gram monofilament. Vibratory sensation intact b/l. Pt has subjective symptoms of neuropathy.  Dermatological Examination: Pedal skin with normal turgor, texture and tone b/l.  No open wounds. No interdigital macerations.   Toenails 1-5 b/l thick, discolored, elongated with subungual debris and pain on dorsal palpation.   Hyperkeratototic lesion(s) submet head 1 right foot, submet head 2 right foot, and submet head 5 b/l. No  erythema, no edema, no drainage, no fluctuance.  Musculoskeletal Examination: Muscle strength 5/5 to all lower extremity muscle groups bilaterally. Tailor's bunion deformity noted b/l LE.SABRAHammertoes 2-5 b/l.  No pain, crepitus or joint limitation noted with ROM b/l LE.  Patient ambulates independently without assistive aids.  Radiographs: None  Assessment/Plan: 1. Pain due to onychomycosis of toenails of both feet   2. Callus of foot   3. Acquired hammertoes of both feet   4. Tailor's bunion of both feet   5. Diabetic peripheral neuropathy associated with type 2 diabetes mellitus (HCC)     Orders Placed This Encounter  Procedures   For home use only DME Other see comment    To [facility]: Dispense one pair extra depth shoes and 3 pair custom insoles.  Offload calluses submet head 5 b/l, submet heads 1 and 2 right foot.    Length of Need:   6 Months  Consent given for treatment. Patient examined. All patient's and/or POA's questions/concerns addressed on today's visit. Mycotic toenails 1-5 b/l debrided in length and girth without incident. Callus(es) submet head 1 right foot, submet head 2 right foot, submet head 5 left foot, and submet head 5 right foot pared with sharp debridement without incident.Continue soft, supportive shoe gear daily. Report any pedal injuries to medical professional. Call office if there are any quesitons/concerns. -DME Rx written for [facility] for one pair extra depth shoes and 3 pair custom molded insoles. To [facility], [address], [address], [place], [place]. Phone: [phone]. Fax: [phone]. Return in about 3 months (around 11/19/2024).  Bridget Owens,  DPM      [place] LOCATION: [address], [place] [address]                   Office [phone]   [facility] LOCATION: [address] [place], [place]  [address] Office [phone]

## 2024-09-10 NOTE — Progress Notes (Deleted)
 Incoming: Arloa Jarvis, NP  Start: *** end: ***  Patient is here today ***. Patient would like to learn ***. Patient lives with ***.  *** shopping and cooking. Pt reports eating out *** times weekly.  Pt reports making the following changes including ***.  All Pt's questions were answered during this encounter.    History includes:  *** Medications include:  *** Labs noted:  ***   7.1%, met, ozempic 

## 2024-09-16 ENCOUNTER — Encounter: Admitting: Dietician

## 2024-09-16 DIAGNOSIS — E119 Type 2 diabetes mellitus without complications: Secondary | ICD-10-CM

## 2024-10-05 ENCOUNTER — Encounter: Admitting: Dietician

## 2024-11-12 ENCOUNTER — Inpatient Hospital Stay (HOSPITAL_BASED_OUTPATIENT_CLINIC_OR_DEPARTMENT_OTHER): Admission: RE | Admit: 2024-11-12 | Discharge: 2024-11-12 | Attending: Nurse Practitioner

## 2024-11-12 DIAGNOSIS — Z780 Asymptomatic menopausal state: Secondary | ICD-10-CM

## 2024-11-23 ENCOUNTER — Encounter: Admitting: Dietician

## 2024-12-09 ENCOUNTER — Ambulatory Visit: Admitting: Podiatry
